# Patient Record
Sex: Male | Born: 1955 | Race: White | Hispanic: No | Smoking: Former smoker
Health system: Southern US, Community
[De-identification: ages and names within clinical notes are randomized; demographics above are authoritative.]

## PROBLEM LIST (undated history)

## (undated) DIAGNOSIS — M1990 Unspecified osteoarthritis, unspecified site: Secondary | ICD-10-CM

## (undated) DIAGNOSIS — G44009 Cluster headache syndrome, unspecified, not intractable: Secondary | ICD-10-CM

## (undated) DIAGNOSIS — Z9981 Dependence on supplemental oxygen: Secondary | ICD-10-CM

## (undated) DIAGNOSIS — J449 Chronic obstructive pulmonary disease, unspecified: Secondary | ICD-10-CM

## (undated) DIAGNOSIS — K219 Gastro-esophageal reflux disease without esophagitis: Secondary | ICD-10-CM

## (undated) DIAGNOSIS — J302 Other seasonal allergic rhinitis: Secondary | ICD-10-CM

## (undated) DIAGNOSIS — I2699 Other pulmonary embolism without acute cor pulmonale: Secondary | ICD-10-CM

## (undated) DIAGNOSIS — J189 Pneumonia, unspecified organism: Secondary | ICD-10-CM

## (undated) DIAGNOSIS — I214 Non-ST elevation (NSTEMI) myocardial infarction: Secondary | ICD-10-CM

## (undated) DIAGNOSIS — J42 Unspecified chronic bronchitis: Secondary | ICD-10-CM

## (undated) DIAGNOSIS — F419 Anxiety disorder, unspecified: Secondary | ICD-10-CM

## (undated) HISTORY — DX: Chronic obstructive pulmonary disease, unspecified: J44.9

## (undated) HISTORY — PX: TURBINATE REDUCTION: SHX6157

## (undated) HISTORY — DX: Other seasonal allergic rhinitis: J30.2

---

## 1981-11-09 HISTORY — PX: NASAL SEPTOPLASTY W/ TURBINOPLASTY: SHX2070

## 2002-03-15 ENCOUNTER — Encounter (INDEPENDENT_AMBULATORY_CARE_PROVIDER_SITE_OTHER): Payer: Self-pay | Admitting: Specialist

## 2002-03-15 ENCOUNTER — Ambulatory Visit (HOSPITAL_COMMUNITY): Admission: RE | Admit: 2002-03-15 | Discharge: 2002-03-15 | Payer: Self-pay | Admitting: Gastroenterology

## 2008-05-17 ENCOUNTER — Emergency Department (HOSPITAL_COMMUNITY): Admission: EM | Admit: 2008-05-17 | Discharge: 2008-05-18 | Payer: Self-pay | Admitting: Emergency Medicine

## 2011-03-27 NOTE — Procedures (Signed)
[place]. [facility]  Patient:    Theodore [name], Theodore [name] Visit Number: [id number] MRN: [id number]          Service Type: END Location: ENDO Attending Physician:  Charna Elizabeth Dictated by:   Anselmo Rod, M.D. Proc. Date: 03/15/02 Admit Date:  03/15/2002   CC:         Gabriel Earing, M.D.   Procedure Report  DATE OF BIRTH:  05-21-1956.  REFERRING PHYSICIAN:  Gabriel Earing, M.D.  PROCEDURE PERFORMED:  Colonoscopy with biopsies.  ENDOSCOPIST:  Anselmo Rod, M.D.  INSTRUMENT USED:  Olympus video colonoscope.  INDICATIONS FOR PROCEDURE:  Rectal bleeding in a 55 year old white male rule out colonic polyps masses, hemorrhoids, etc.  PREPROCEDURE PREPARATION:  Informed consent was procured from the patient. The patient was fasted for eight hours prior to the procedure and prepped with a bottle of magnesium citrate and a gallon of NuLytely the night prior to the procedure.  PREPROCEDURE PHYSICAL:  The patient had stable vital signs.  Neck supple. Chest clear to auscultation.  S1, S2 regular.  Abdomen soft with normal bowel sounds.  DESCRIPTION OF PROCEDURE:  The patient was placed in the left lateral decubitus position and sedated with 70 mg of Demerol and 7 mg intravenously. Once the patient was adequately sedated and maintained on low-flow oxygen and continuous cardiac monitoring, the Olympus video colonoscope was advanced from the rectum to the cecum without difficulty.  A small sessile polyp was seen at the cecal base.  This was biopsied for pathology.  Lipomatous lesion was also seen in the proximal transverse colon.  It was also biopsied for pathology. The patient had small internal hemorrhoids on retroflexion in the rectum and tolerated the procedure well without complications.  No large masses, polyps or diverticula were seen.  IMPRESSION: 1. Sessile polyp biopsied from cecal base. 2. Small lipomatous lesion biopsied from proximal  transverse colon. 3. Small internal hemorrhoid. 4. No evidence of diverticulosis.  RECOMMENDATIONS: 1. Await pathology results. 2. High fiber diet. 3. Outpatient follow-up in the next two weeks.Dictated by:   Anselmo Rod, M.D. Attending Physician:  Charna Elizabeth DD:  03/15/02 TD:  03/16/02 Job: [id number]

## 2011-08-06 LAB — COMPREHENSIVE METABOLIC PANEL
ALT: 23
AST: 23
Alkaline Phosphatase: 61
CO2: 27
Calcium: 9.6
Chloride: 105
GFR calc non Af Amer: >60
Glucose, Bld: 122 — ABNORMAL HIGH
Potassium: 4.3
Sodium: 139
Total Bilirubin: 0.7

## 2011-08-06 LAB — CBC
Hemoglobin: 15.6
MCHC: 34.2
RBC: 4.89
WBC: 15.6 — ABNORMAL HIGH

## 2011-08-06 LAB — LIPASE, BLOOD: Lipase: 20

## 2011-08-06 LAB — URINALYSIS, ROUTINE W REFLEX MICROSCOPIC
Bilirubin Urine: NEGATIVE
Ketones, ur: NEGATIVE
Leukocytes, UA: NEGATIVE
Nitrite: NEGATIVE
Protein, ur: NEGATIVE

## 2011-08-06 LAB — URINE MICROSCOPIC-ADD ON

## 2011-08-06 LAB — DIFFERENTIAL
Basophils Absolute: 0.1
Basophils Relative: 0
Eosinophils Absolute: 0
Eosinophils Relative: 0
Neutrophils Relative %: 89 — ABNORMAL HIGH

## 2011-09-28 ENCOUNTER — Ambulatory Visit (INDEPENDENT_AMBULATORY_CARE_PROVIDER_SITE_OTHER): Payer: 59 | Admitting: Internal Medicine

## 2011-09-28 ENCOUNTER — Encounter: Payer: Self-pay | Admitting: Internal Medicine

## 2011-09-28 ENCOUNTER — Ambulatory Visit (INDEPENDENT_AMBULATORY_CARE_PROVIDER_SITE_OTHER)
Admission: RE | Admit: 2011-09-28 | Discharge: 2011-09-28 | Disposition: A | Discharge status: Home / Self Care | Payer: 59 | Source: Ambulatory Visit | Attending: Internal Medicine | Admitting: Internal Medicine

## 2011-09-28 VITALS — BP 112/82 | HR 73 | Temp 98.8°F | Ht 76.0 in | Wt 189.0 lb

## 2011-09-28 DIAGNOSIS — J449 Chronic obstructive pulmonary disease, unspecified: Secondary | ICD-10-CM

## 2011-09-28 MED ORDER — BUDESONIDE-FORMOTEROL FUMARATE 160-4.5 MCG/ACT IN AERO: INHALATION_SPRAY | RESPIRATORY_TRACT | Status: DC

## 2011-09-28 MED ORDER — PREDNISONE (PAK) 10 MG PO TABS: ORAL_TABLET | ORAL | Status: AC

## 2011-09-28 NOTE — Progress Notes (Signed)
  Subjective:    Patient ID: Theodore [name], male    DOB: 1955-11-17, 55 y.o.   MRN: [id number]  HPI  47 yowm quit smoking around 2004 p onset of breathing difficulty with doe since flu in 1994 and slowly downhill since quit smoking in terms of best day function so referred to Pulmonary clinic 09/2011 by Dr Reggy Eye  09/28/2011 1st pulmonary eval cc persistent doe x 8 years slowly progressive with best days now moderate pace x mall ok and some days much worse despite spriva and foradil daily. Minimally better from albuterol.  Much better resp symptoms p prednisone for cluster ha, def improvement in head congestion, also less cough with prednsione.   Presently having more cough at hs > white mucus, freq exac in am's, better p coffeee and gets around to taking am meds only an hour or two p rising.  Sleeping ok without nocturnal  exacerbation  of respiratory  c/o's or need for noct saba.  Also denies any obvious fluctuation of symptoms with weather or environmental changes or other aggravating or alleviating factors except as outlined above   No previous h/o allergies or asthma as child or adult, no overt HB      Review of Systems  Constitutional: Negative for fever, chills, activity change, appetite change and unexpected weight change.  HENT: Positive for congestion. Negative for sore throat, rhinorrhea, sneezing, trouble swallowing, dental problem, voice change and postnasal drip.   Eyes: Negative for visual disturbance.  Respiratory: Positive for cough and shortness of breath. Negative for choking.   Cardiovascular: Negative for chest pain and leg swelling.  Gastrointestinal: Negative for nausea, vomiting and abdominal pain.  Genitourinary: Negative for difficulty urinating.  Musculoskeletal: Negative for arthralgias.  Skin: Negative for rash.  Psychiatric/Behavioral: Negative for behavioral problems and confusion.       Objective:   Physical Exam  Thin pleasant amb wm nad  Wt  189 09/28/2011   HEENT mild turbinate edema.  Oropharynx no thrush or excess pnd or cobblestoning.  No JVD or cervical adenopathy. Mild accessory muscle hypertrophy. Trachea midline, nl thryroid. Chest was hyperinflated by percussion with diminished breath sounds and moderate increased exp time without wheeze. Hoover sign positive at mid inspiration. Regular rate and rhythm without murmur gallop or rub or increase P2 or edema.  Abd: no hsm, nl excursion. Ext warm without cyanosis or clubbing.    CXR  09/28/2011 :  Changes of COPD. No acute abnormalities.      Assessment & Plan:

## 2011-09-28 NOTE — Patient Instructions (Addendum)
Please remember to go to the  x-ray department downstairs for your tests - we will call you with the results when they are available.  Stop foradil  Start symbicort 160  Take 2 puffs first thing in am and then another 2 puffs about 12 hours later.   Work on inhaler technique:  relax and gently blow all the way out then take a nice smooth deep breath back in, triggering the inhaler at same time you start breathing in.  Hold for up to 5 seconds if you can.  Rinse and gargle with water when done   If your mouth or throat starts to bother you,   I suggest you time the inhaler to your dental care and after using the inhaler(s) brush teeth and tongue with a baking soda containing toothpaste and when you rinse this out, gargle with it first to see if this helps your mouth and throat.     Prednisone 10 mg take  4 each am x 2 days,   2 each am x 2 days,  1 each am x2days and stop  Please schedule a follow up office visit in 4 weeks, sooner if needed with pft's

## 2011-09-28 NOTE — Assessment & Plan Note (Addendum)
Moderate clinically sp remote smoking cessation with asthmatic component suggested by improvement albeit temporary to prednisone rx in past.  Although he says he's tried symbicort in past, his hfa was poor but improve to 75% p coaching so worth rechallenging at this point with symbicort 160 Take 2 puffs first thing in am and then another 2 puffs about 12 hour later then regroup in 4 weeks with pft's.  Also reviewed dpi and improved to 100%

## 2011-09-29 ENCOUNTER — Telehealth: Payer: Self-pay | Admitting: Internal Medicine

## 2011-09-29 NOTE — Telephone Encounter (Signed)
Must be an error

## 2011-09-29 NOTE — Telephone Encounter (Signed)
Theodore [name] is aware

## 2011-09-29 NOTE — Telephone Encounter (Signed)
I spoke with vicky and she states she received HPI and pt's assessment plan. She was wanting to know if this was a referral to hospice. I looked in pt chart and did not see anything regarding hospice. Will forward to Dr. Sherene Sires to see if pt was suppose to be referred to hospice. Please advise Dr. Sherene Sires, thanks

## 2011-09-30 ENCOUNTER — Telehealth: Payer: Self-pay | Admitting: Internal Medicine

## 2011-09-30 NOTE — Telephone Encounter (Signed)
Pt aware of cxr results per Mw.

## 2011-11-02 ENCOUNTER — Ambulatory Visit: Payer: 59 | Admitting: Internal Medicine

## 2011-12-02 ENCOUNTER — Ambulatory Visit (INDEPENDENT_AMBULATORY_CARE_PROVIDER_SITE_OTHER): Payer: 59 | Admitting: Internal Medicine

## 2011-12-02 ENCOUNTER — Encounter: Payer: Self-pay | Admitting: Internal Medicine

## 2011-12-02 VITALS — BP 150/100 | HR 73 | Temp 97.7°F | Ht 76.0 in | Wt 188.0 lb

## 2011-12-02 DIAGNOSIS — J449 Chronic obstructive pulmonary disease, unspecified: Secondary | ICD-10-CM

## 2011-12-02 LAB — PULMONARY FUNCTION TEST

## 2011-12-02 MED ORDER — MOMETASONE FURO-FORMOTEROL FUM 200-5 MCG/ACT IN AERO: INHALATION_SPRAY | RESPIRATORY_TRACT | Status: DC

## 2011-12-02 MED ORDER — LEVALBUTEROL TARTRATE 45 MCG/ACT IN AERO: 1.0000 | INHALATION_SPRAY | RESPIRATORY_TRACT | Status: DC | PRN

## 2011-12-02 NOTE — Progress Notes (Signed)
PFT done today. 

## 2011-12-02 NOTE — Progress Notes (Signed)
  Subjective:    Patient ID: Theodore [name], male    DOB: 12-Aug-1956   MRN: [id number]  HPI  Brief patient profile:  66 yowm quit smoking around 2004 p onset of breathing difficulty with doe since flu in 1994 and slowly downhill since quit smoking in terms of best day function so referred to Pulmonary clinic 09/2011 by Dr Reggy Eye  09/28/2011 1st pulmonary eval cc persistent doe x 8 years slowly progressive with best days now moderate pace x mall ok and some days much worse despite spriva and foradil daily. Minimally better from albuterol.  Much better resp symptoms p prednisone for cluster ha, def improvement in head congestion, also less cough with prednsione.   Presently having more cough at hs > white mucus, freq exac in am's, better p coffeee and gets around to taking am meds only an hour or two p rising. rec Please remember to go to the  x-ray department downstairs for your tests - we will call you with the results when they are available. Stop foradil Start symbicort 160  Take 2 puffs first thing in am and then another 2 puffs about 12 hours later.  Work on inhaler technique:  .    Prednisone 10 mg take  4 each am x 2 days,   2 each am x 2 days,  1 each am x2days and stop Please schedule a follow up office visit in 4 weeks, sooner if needed with pft's   12/02/2011 f/u ov/Astrid Vides cc better on prednisone then some worse off it but overall  Better vs baseline doe. No cough  Sleeping ok without nocturnal  exacerbation  of respiratory  c/o's or need for noct saba.  Also denies any obvious fluctuation of symptoms with weather or environmental changes or other aggravating or alleviating factors except as outlined above   No previous h/o allergies or asthma as child or adult, no overt HB  ROS  At present neg for  any significant sore throat, dysphagia, itching, sneezing,  nasal congestion or excess/ purulent secretions,  fever, chills, sweats, unintended wt loss, pleuritic or exertional cp,  hempoptysis, orthopnea pnd or leg swelling.  Also denies presyncope, palpitations, heartburn, abdominal pain, nausea, vomiting, diarrhea  or change in bowel or urinary habits, dysuria,hematuria,  rash, arthralgias, visual complaints, headache, numbness weakness or ataxia.               Objective:   Physical Exam  Thin pleasant amb wm nad  Wt 189 09/28/2011 > 12/02/2011  188  HEENT mild turbinate edema.  Oropharynx no thrush or excess pnd or cobblestoning.  No JVD or cervical adenopathy. Mild accessory muscle hypertrophy. Trachea midline, nl thryroid. Chest was hyperinflated by percussion with diminished breath sounds and moderate increased exp time without wheeze. Hoover sign positive at mid inspiration. Regular rate and rhythm without murmur gallop or rub or increase P2 or edema.  Abd: no hsm, nl excursion. Ext warm without cyanosis or clubbing.    CXR  09/28/2011 :  Changes of COPD. No acute abnormalities.      Assessment & Plan:

## 2011-12-02 NOTE — Patient Instructions (Signed)
Dulera 200 Take 2 puffs first thing in am and then another 2 puffs about 12 hours later and if you like it fill the prescription  Continue spiriva each am only - take this  immediately after dulera   Work on [employer] perfect inhaler technique:  relax and gently blow all the way out then take a nice smooth deep breath back in, triggering the inhaler at same time you start breathing in.  Hold for up to 5 seconds if you can.  Rinse and gargle with water when done   If your mouth or throat starts to bother you,   I suggest you time the inhaler to your dental care and after using the inhaler(s) brush teeth and tongue with a baking soda containing toothpaste and when you rinse this out, gargle with it first to see if this helps your mouth and throat.     Avoid salt and monitor your blood pressure and follow up with your primary doctor   For cough mucinex dm   Only use your albuterol as a rescue medication to be used if you can't catch your breath by resting or doing a relaxed purse lip breathing pattern. The less you use it, the better it will work when you need it.   Please schedule a follow up visit in 3 months but call sooner if needed

## 2011-12-10 ENCOUNTER — Encounter: Payer: Self-pay | Admitting: Internal Medicine

## 2011-12-11 NOTE — Assessment & Plan Note (Signed)
-   HFA 75% p coaching > 90% p coaching 12/02/2011    - PFT's 12/02/2011  FEV1  0.94 (24%)  But 18% better p B 2 and DLCO 60%  GOLD IV but a significant reversal after B2 should be at least partially improved with steroids, as his hx suggests  The proper method of use, as well as anticipated side effects, of this metered-dose inhaler are discussed and demonstrated to the patient. Improved to 90% with extensive coaching, try dulera 200 bid and spiriva then return in 3 months to regroup.

## 2012-02-03 ENCOUNTER — Telehealth: Payer: Self-pay | Admitting: Internal Medicine

## 2012-02-03 ENCOUNTER — Encounter: Payer: Self-pay | Admitting: Internal Medicine

## 2012-02-03 ENCOUNTER — Ambulatory Visit (INDEPENDENT_AMBULATORY_CARE_PROVIDER_SITE_OTHER): Payer: 59 | Admitting: Internal Medicine

## 2012-02-03 ENCOUNTER — Inpatient Hospital Stay (HOSPITAL_COMMUNITY)
Admission: AD | Admit: 2012-02-03 | Discharge: 2012-02-06 | DRG: 191 | Disposition: A | Discharge status: Home / Self Care | Payer: 59 | Source: Ambulatory Visit | Attending: Internal Medicine | Admitting: Internal Medicine

## 2012-02-03 VITALS — BP 148/78 | HR 106 | Temp 98.6°F | Ht 76.0 in | Wt 189.8 lb

## 2012-02-03 DIAGNOSIS — E871 Hypo-osmolality and hyponatremia: Secondary | ICD-10-CM | POA: Diagnosis present

## 2012-02-03 DIAGNOSIS — J441 Chronic obstructive pulmonary disease with (acute) exacerbation: Secondary | ICD-10-CM

## 2012-02-03 DIAGNOSIS — Z23 Encounter for immunization: Secondary | ICD-10-CM

## 2012-02-03 DIAGNOSIS — Z825 Family history of asthma and other chronic lower respiratory diseases: Secondary | ICD-10-CM

## 2012-02-03 DIAGNOSIS — J309 Allergic rhinitis, unspecified: Secondary | ICD-10-CM | POA: Diagnosis present

## 2012-02-03 DIAGNOSIS — Z79899 Other long term (current) drug therapy: Secondary | ICD-10-CM

## 2012-02-03 DIAGNOSIS — J449 Chronic obstructive pulmonary disease, unspecified: Secondary | ICD-10-CM

## 2012-02-03 DIAGNOSIS — F17200 Nicotine dependence, unspecified, uncomplicated: Secondary | ICD-10-CM | POA: Diagnosis present

## 2012-02-03 LAB — CBC
MCH: 31.8 pg (ref 26.0–34.0)
MCV: 94.2 fL (ref 78.0–100.0)
Platelets: 266 10*3/uL (ref 150–400)
RDW: 13.2 % (ref 11.5–15.5)

## 2012-02-03 LAB — BLOOD GAS, ARTERIAL
Bicarbonate: 22 mEq/L (ref 20.0–24.0)
Drawn by: 317871
FIO2: 0.21 %
O2 Saturation: 97.9 %
Patient temperature: 37

## 2012-02-03 LAB — DIFFERENTIAL
Basophils Absolute: 0 10*3/uL (ref 0.0–0.1)
Eosinophils Absolute: 0 10*3/uL (ref 0.0–0.7)
Eosinophils Relative: 0 % (ref 0–5)

## 2012-02-03 LAB — COMPREHENSIVE METABOLIC PANEL
Alkaline Phosphatase: 67 U/L (ref 39–117)
BUN: 14 mg/dL (ref 6–23)
Chloride: 95 mEq/L — ABNORMAL LOW (ref 96–112)
GFR calc Af Amer: >90 mL/min (ref >90)
Glucose, Bld: 93 mg/dL (ref 70–99)
Potassium: 4.1 mEq/L (ref 3.5–5.1)
Total Bilirubin: 0.8 mg/dL (ref 0.3–1.2)
Total Protein: 7.7 g/dL (ref 6.0–8.3)

## 2012-02-03 LAB — MAGNESIUM: Magnesium: 1.8 mg/dL (ref 1.5–2.5)

## 2012-02-03 LAB — CARDIAC PANEL(CRET KIN+CKTOT+MB+TROPI): Relative Index: 1.5 (ref 0.0–2.5)

## 2012-02-03 MED ORDER — METHYLPREDNISOLONE SODIUM SUCC 125 MG IJ SOLR
60.0000 mg | Freq: Four times a day (QID) | INTRAMUSCULAR | Status: DC
  Administered 2012-02-03 – 2012-02-04 (×3): 60 mg via INTRAVENOUS
  Filled 2012-02-03 (×6): qty 0.96

## 2012-02-03 MED ORDER — DEXTROSE 5 % IV SOLN
1.0000 g | INTRAVENOUS | Status: DC
  Administered 2012-02-03: 1 g via INTRAVENOUS
  Filled 2012-02-03 (×2): qty 10

## 2012-02-03 MED ORDER — LEVALBUTEROL HCL 0.63 MG/3ML IN NEBU
0.6300 mg | INHALATION_SOLUTION | Freq: Once | RESPIRATORY_TRACT | Status: AC
  Administered 2012-02-03: 0.63 mg via RESPIRATORY_TRACT

## 2012-02-03 MED ORDER — HEPARIN SODIUM (PORCINE) 5000 UNIT/ML IJ SOLN
5000.0000 [IU] | Freq: Three times a day (TID) | INTRAMUSCULAR | Status: DC
  Administered 2012-02-03 – 2012-02-06 (×8): 5000 [IU] via SUBCUTANEOUS
  Filled 2012-02-03 (×11): qty 1

## 2012-02-03 MED ORDER — DEXTROSE 5 % IV SOLN
500.0000 mg | INTRAVENOUS | Status: DC
  Administered 2012-02-03: 500 mg via INTRAVENOUS
  Filled 2012-02-03 (×2): qty 500

## 2012-02-03 MED ORDER — ALBUTEROL SULFATE (5 MG/ML) 0.5% IN NEBU
2.5000 mg | INHALATION_SOLUTION | RESPIRATORY_TRACT | Status: DC | PRN
  Filled 2012-02-03: qty 0.5

## 2012-02-03 MED ORDER — SODIUM CHLORIDE 0.9 % IV SOLN: 250.0000 mL | INTRAVENOUS | Status: DC | PRN

## 2012-02-03 MED ORDER — ASPIRIN 81 MG PO CHEW
324.0000 mg | CHEWABLE_TABLET | ORAL | Status: AC
  Administered 2012-02-03: 324 mg via ORAL
  Filled 2012-02-03: qty 4

## 2012-02-03 MED ORDER — ALBUTEROL SULFATE (5 MG/ML) 0.5% IN NEBU
2.5000 mg | INHALATION_SOLUTION | RESPIRATORY_TRACT | Status: DC
  Administered 2012-02-03 – 2012-02-04 (×5): 2.5 mg via RESPIRATORY_TRACT
  Filled 2012-02-03 (×5): qty 0.5

## 2012-02-03 MED ORDER — IPRATROPIUM BROMIDE 0.02 % IN SOLN
0.5000 mg | RESPIRATORY_TRACT | Status: DC
  Administered 2012-02-03 – 2012-02-04 (×5): 0.5 mg via RESPIRATORY_TRACT
  Filled 2012-02-03 (×5): qty 2.5

## 2012-02-03 MED ORDER — ASPIRIN 300 MG RE SUPP
300.0000 mg | RECTAL | Status: AC
  Filled 2012-02-03: qty 1

## 2012-02-03 MED ORDER — METHYLPREDNISOLONE ACETATE 80 MG/ML IJ SUSP
80.0000 mg | Freq: Once | INTRAMUSCULAR | Status: AC
Start: 2012-02-03 — End: 2012-02-03
  Administered 2012-02-03: 80 mg via INTRAMUSCULAR

## 2012-02-03 NOTE — H&P (Addendum)
Patient name: Theodore [name] Medical record number: [id number] Date of birth: May 30, 1956 Age: 56 y.o. Gender: male PCP: [facility].  Date: 02/03/2012  Brief history  AECOPD. ADmitted from office 02/03/2012    Lines/tubes  Culture data/sepsis markers  Antibiotics Anti-infectives    None        Best practice Heparin Protonix  Protocols/consults  Events/studies  HPI:    55 yowm quit smoking around 2004 p onset of breathing difficulty with doe since flu in 1994 and slowly downhill since quit smoking in terms of best day function so referred to Pulmonary clinic 09/2011 by Dr Reggy Eye   To Dr Jerilee Hoh  ACUTE OV 02/03/2012   [facility]  - PFTs 12/02/11: Fev1 0.94/24% with 18% BD response. TLC 128% and DLCO 60% . Maintainted on dulera and spiriva. AT baseline says last prednsione burst was nov 2012 and only one pred burst past year. Last hospitalizaiton was when he was a child. 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   However, currently  - 4 days ago worked hard at [employer] in [facility]. Then 3 days ago had suddent onset dyspnea, cough, wheeze. Rates it as severe. Progressive. Very dyspneic. Assocuated white sputum with yellow hue present. Orthopneic +. Similar to episode in nov 2012 when prednisone helped but this is worst and says last night was frighteningin. Denies fever, chest pain, nause, vomit, diarrhea, edema .    - note he is listed as ex-smoker but he admitted to me that he smokes a few cigs occasionally   - Current outpatient prescriptions:FORADIL AEROLIZER 12 MCG capsule for inhaler, Place 1 capsule into inhaler and inhale Twice daily., Disp: , Rfl: ; levalbuterol (XOPENEX HFA) 45 MCG/ACT inhaler, Inhale 1 puff into the lungs every 4 (four) hours as needed for wheezing., Disp: 1 Inhaler, Rfl: 2; SPIRIVA HANDIHALER 18 MCG inhalation capsule, Inhale contents of 1 cap daily, Disp: , Rfl:      Past Medical History    Diagnosis Date  . COPD (chronic obstructive pulmonary disease)   . Chronic headache   . Seasonal allergies     Past Surgical History  Procedure Date  . Nasal septoplasty w/ turbinoplasty 1983    Family History  Problem Relation Age of Onset  . Leukemia Father   . Heart disease Father   . Asthma Sister   . Asthma Father     has asthma as a child  . Emphysema Sister     smoker    Social History:  reports that he has been smoking Cigarettes.  He has smoked for the past 35 years. He has never used smokeless tobacco. He reports that he drinks alcohol. He reports that he does not use illicit drugs.  Allergies:  Allergies  Allergen Reactions  . Aleve Itching    Medications:  Prior to Admission medications   Medication Sig Start Date End Date Taking? Authorizing Provider  FORADIL AEROLIZER 12 MCG capsule for inhaler Place 1 capsule into inhaler and inhale Twice daily. 11/06/11   Historical Provider, MD  levalbuterol Pauline Aus HFA) 45 MCG/ACT inhaler Inhale 1 puff into the lungs every 4 (four) hours as needed for wheezing. 12/02/11 12/01/12  Nyoka Cowden, MD  SPIRIVA HANDIHALER 18 MCG inhalation capsule Inhale contents of 1 cap daily 09/27/11   Historical Provider, MD    Pertinent items are noted in HPI. Otherwise 11 point ROS negative  Review of Systems  Constitutional: Negative for  fever and unexpected weight change.  HENT: Negative for ear pain, nosebleeds, congestion, sore throat, rhinorrhea, sneezing, trouble swallowing, dental problem, postnasal drip and sinus pressure.  Eyes: Negative for redness and itching.  Respiratory: Positive for cough, chest tightness and shortness of breath. Negative for wheezing.  Cardiovascular: Negative for palpitations and leg swelling.  Gastrointestinal: Negative for nausea and vomiting.  Genitourinary: Negative for dysuria.  Musculoskeletal: Negative for joint swelling.  Skin: Negative for rash.  Neurological: Negative for headaches.   Hematological: Does not bruise/bleed easily.  Psychiatric/Behavioral: Negative for dysphoric mood. The patient is not nervous/anxious.    Temp:  [98.6 F (37 C)] 98.6 F (37 C) (03/27 1617) Pulse Rate:  [106] 106  (03/27 1617) BP: (148)/(78) 148/78 mmHg (03/27 1617) SpO2:  [93 %] 93 % (03/27 1617) Weight:  [86.093 kg (189 lb 12.8 oz)] 86.093 kg (189 lb 12.8 oz) (03/27 1617)   No intake or output data in the 24 hours ending 02/03/12 1729 Physical exam  Physical Exam  Nursing note and vitals reviewed.  Constitutional: He is oriented to person, place, and time. He appears well-developed and well-nourished. No distress.  Body mass index is 23.10 kg/(m^2). HENT:  Head: Normocephalic and atraumatic.  Right Ear: External ear normal.  Left Ear: External ear normal.  Mouth/Throat: Oropharynx is clear and moist. No oropharyngeal exudate.  Eyes: Conjunctivae and EOM are normal. Pupils are equal, round, and reactive to light. Right eye exhibits no discharge. Left eye exhibits no discharge. No scleral icterus.  Neck: Normal range of motion. Neck supple. No JVD present. No tracheal deviation present. No thyromegaly present.  Cardiovascular: Normal rate, regular rhythm and intact distal pulses. Exam reveals no gallop and no friction rub.  No murmur heard.  Pulmonary/Chest: He is in respiratory distress. He has no wheezes. He has no rales. He exhibits no tenderness.  Uses accessory muscle Unable to complete sentences Coughs periodically No wheeze  Mildly tachypneic No cyanosis   Abdominal: Soft. Bowel sounds are normal. He exhibits no distension and no mass. There is no tenderness. There is no rebound and no guarding.  Musculoskeletal: Normal range of motion. He exhibits no edema and no tenderness.  Lymphadenopathy:  He has no cervical adenopathy.  Neurological: He is alert and oriented to person, place, and time. He has normal reflexes. No cranial nerve deficit. Coordination normal.   Skin: Skin is warm and dry. No rash noted. He is not diaphoretic. No erythema. No pallor.  Psychiatric: He has a normal mood and affect. His behavior is normal. Judgment and thought content normal.      radiology    LAB RESULT Lab Results  Component Value Date   CREATININE 1.02 05/17/2008   BUN 12 05/17/2008   NA 139 05/17/2008   K 4.3 05/17/2008   CL 105 05/17/2008   CO2 27 05/17/2008   Lab Results  Component Value Date   WBC 15.6* 05/17/2008   HGB 15.6 05/17/2008   HCT 45.5 05/17/2008   MCV 93.0 05/17/2008   PLT 341 05/17/2008   Lab Results  Component Value Date   ALT 23 05/17/2008   AST 23 05/17/2008   ALKPHOS 61 05/17/2008   BILITOT 0.7 05/17/2008   No results found for this basename: INR, PROTIME     Assessment and Plan    Acute Exacerbation COPD   PLAN   Admit [place] [address]  Rx AECOPD with nebs and steroids and antibiotics  Check cxr, abg, labs   Tobacco Use  PLAN   Inpatient counseling to stop    Dr. Kalman Shan, M.D., [facility].C.P Pulmonary and Critical Care Medicine Staff Physician [facility] Pager: [phone], If no answer or between  15:00h - 7:00h: call [phone]  02/03/2012 5:46 PM

## 2012-02-03 NOTE — Telephone Encounter (Signed)
Called, spoke with pt.  He c/o increased SOB, prod cough with dark white mucus, wheezing, and chest tightness.  Started on Sunday night.  Denies f/c/s.  OV scheduled with MR for today at 4:15 pm -- pt aware.

## 2012-02-03 NOTE — Progress Notes (Signed)
Subjective:    Patient ID: Theodore [name], male    DOB: [birth year], 56 y.o.   MRN: [id number]  HPI 10 yowm quit smoking around 2004 p onset of breathing difficulty with doe since flu in 1994 and slowly downhill since quit smoking in terms of best day function so referred to Pulmonary clinic 09/2011 by Dr Reggy Eye  09/28/2011 1st pulmonary eval cc persistent doe x 8 years slowly progressive with best days now moderate pace x mall ok and some days much worse despite spriva and foradil daily. Minimally better from albuterol.  Much better resp symptoms p prednisone for cluster ha, def improvement in head congestion, also less cough with prednsione.   Presently having more cough at hs > white mucus, freq exac in am's, better p coffeee and gets around to taking am meds only an hour or two p rising. rec Please remember to go to the  x-ray department downstairs for your tests - we will call you with the results when they are available. Stop foradil Start symbicort 160  Take 2 puffs first thing in am and then another 2 puffs about 12 hours later.  Work on inhaler technique:  .    Prednisone 10 mg take  4 each am x 2 days,   2 each am x 2 days,  1 each am x2days and stop Please schedule a follow up office visit in 4 weeks, sooner if needed with pft's   12/02/2011 f/u ov/Wert cc better on prednisone then some worse off it but overall  Better vs baseline doe. No cough  Sleeping ok without nocturnal  exacerbation  of respiratory  c/o's or need for noct saba.  Also denies any obvious fluctuation of symptoms with weather or environmental changes or other aggravating or alleviating factors except as outlined above   No previous h/o allergies or asthma as child or adult, no overt HB  Dulera 200 Take 2 puffs first thing in am and then another 2 puffs about 12 hours later and if you like it fill the prescription  Continue spiriva each am only - take this immediately after dulera  Work on [employer]  perfect inhaler technique: relax and gently blow all the way out then take a nice smooth deep breath back in, triggering the inhaler at same time you start breathing in. Hold for up to 5 seconds if you can. Rinse and gargle with water when done  If your mouth or throat starts to bother you, I suggest you time the inhaler to your dental care and after using the inhaler(s) brush teeth and tongue with a baking soda containing toothpaste and when you rinse this out, gargle with it first to see if this helps your mouth and throat.  Avoid salt and monitor your blood pressure and follow up with your primary doctor  For cough mucinex dm  Only use your albuterol as a rescue medication to be used if you can't catch your breath by resting or doing a relaxed purse lip breathing pattern. The less you use it, the better it will work when you need it.  Please schedule a follow up visit in 3 months but call sooner if needed   OV 02/03/2012  COPD  Baseline   - PFTs 12/02/11: Fev1 0.94/24% with 18% BD response. TLC 128% and DLCO 60% . Maintainted on dulera and spiriva. AT baseline says last prednsione burst was nov 2012 and only one pred burst past year. Last hospitalizaiton was when he was  a child. 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   Currently  - 4 days ago worked hard at [employer] in [facility]. Then 3 days ago had suddent onset dyspnea, cough, wheeze. Rates it as severe. Progressive. Very dyspneic. Assocuated white sputum with yellow hue present. Orthopneic +. Similar to episode in nov 2012 when prednisone helped.  Denies fever, chest pain, nause, vomit, diarrhea, edema    Current outpatient prescriptions:FORADIL AEROLIZER 12 MCG capsule for inhaler, Place 1 capsule into inhaler and inhale Twice daily., Disp: , Rfl: ;  levalbuterol (XOPENEX HFA) 45 MCG/ACT inhaler, Inhale 1 puff into the lungs every 4 (four) hours as needed for wheezing., Disp: 1 Inhaler, Rfl: 2;  SPIRIVA  HANDIHALER 18 MCG inhalation capsule, Inhale contents of 1 cap daily, Disp: , Rfl:   Past, Family, Social reviewed: no change since last visit     Review of Systems  Constitutional: Negative for fever and unexpected weight change.  HENT: Negative for ear pain, nosebleeds, congestion, sore throat, rhinorrhea, sneezing, trouble swallowing, dental problem, postnasal drip and sinus pressure.   Eyes: Negative for redness and itching.  Respiratory: Positive for cough, chest tightness and shortness of breath. Negative for wheezing.   Cardiovascular: Negative for palpitations and leg swelling.  Gastrointestinal: Negative for nausea and vomiting.  Genitourinary: Negative for dysuria.  Musculoskeletal: Negative for joint swelling.  Skin: Negative for rash.  Neurological: Negative for headaches.  Hematological: Does not bruise/bleed easily.  Psychiatric/Behavioral: Negative for dysphoric mood. The patient is not nervous/anxious.        Objective:   Physical Exam  Nursing note and vitals reviewed. Constitutional: He is oriented to person, place, and time. He appears well-developed and well-nourished. No distress.       Body mass index is 23.10 kg/(m^2).   HENT:  Head: Normocephalic and atraumatic.  Right Ear: External ear normal.  Left Ear: External ear normal.  Mouth/Throat: Oropharynx is clear and moist. No oropharyngeal exudate.  Eyes: Conjunctivae and EOM are normal. Pupils are equal, round, and reactive to light. Right eye exhibits no discharge. Left eye exhibits no discharge. No scleral icterus.  Neck: Normal range of motion. Neck supple. No JVD present. No tracheal deviation present. No thyromegaly present.  Cardiovascular: Normal rate, regular rhythm and intact distal pulses.  Exam reveals no gallop and no friction rub.   No murmur heard. Pulmonary/Chest: He is in respiratory distress. He has no wheezes. He has no rales. He exhibits no tenderness.       Uses accessory  muscle Unable to complete sentences Coughs periodically No wheeze  Mildly tachypneic No cyanosis  Does not think he needs admission  Abdominal: Soft. Bowel sounds are normal. He exhibits no distension and no mass. There is no tenderness. There is no rebound and no guarding.  Musculoskeletal: Normal range of motion. He exhibits no edema and no tenderness.  Lymphadenopathy:    He has no cervical adenopathy.  Neurological: He is alert and oriented to person, place, and time. He has normal reflexes. No cranial nerve deficit. Coordination normal.  Skin: Skin is warm and dry. No rash noted. He is not diaphoretic. No erythema. No pallor.  Psychiatric: He has a normal mood and affect. His behavior is normal. Judgment and thought content normal.         Assessment & Plan:

## 2012-02-03 NOTE — Telephone Encounter (Signed)
Pt called back. Has to go to work now but asks nurse to call [phone]. Marland Kitchen Theodore [name]

## 2012-02-04 ENCOUNTER — Inpatient Hospital Stay (HOSPITAL_COMMUNITY): Payer: 59

## 2012-02-04 ENCOUNTER — Encounter (HOSPITAL_COMMUNITY): Payer: Self-pay | Admitting: *Deleted

## 2012-02-04 DIAGNOSIS — J441 Chronic obstructive pulmonary disease with (acute) exacerbation: Secondary | ICD-10-CM

## 2012-02-04 DIAGNOSIS — F17200 Nicotine dependence, unspecified, uncomplicated: Secondary | ICD-10-CM

## 2012-02-04 DIAGNOSIS — J310 Chronic rhinitis: Secondary | ICD-10-CM

## 2012-02-04 LAB — CARDIAC PANEL(CRET KIN+CKTOT+MB+TROPI)
Relative Index: 1.7 (ref 0.0–2.5)
Total CK: 170 U/L (ref 7–232)
Troponin I: <0.3 ng/mL (ref <0.30)
Troponin I: <0.3 ng/mL (ref <0.30)

## 2012-02-04 MED ORDER — DOXYCYCLINE HYCLATE 100 MG PO TABS
100.0000 mg | ORAL_TABLET | Freq: Two times a day (BID) | ORAL | Status: DC
  Administered 2012-02-04 – 2012-02-06 (×5): 100 mg via ORAL
  Filled 2012-02-04 (×7): qty 1

## 2012-02-04 MED ORDER — SALINE SPRAY 0.65 % NA SOLN
1.0000 | NASAL | Status: DC | PRN
  Filled 2012-02-04: qty 44

## 2012-02-04 MED ORDER — PNEUMOCOCCAL VAC POLYVALENT 25 MCG/0.5ML IJ INJ
0.5000 mL | INJECTION | INTRAMUSCULAR | Status: AC
  Administered 2012-02-05: 0.5 mL via INTRAMUSCULAR
  Filled 2012-02-04 (×2): qty 0.5

## 2012-02-04 MED ORDER — FLUTICASONE PROPIONATE 50 MCG/ACT NA SUSP
2.0000 | Freq: Every day | NASAL | Status: DC
  Administered 2012-02-05 – 2012-02-06 (×2): 2 via NASAL
  Filled 2012-02-04: qty 16

## 2012-02-04 MED ORDER — ALBUTEROL SULFATE (5 MG/ML) 0.5% IN NEBU
2.5000 mg | INHALATION_SOLUTION | Freq: Four times a day (QID) | RESPIRATORY_TRACT | Status: DC
  Administered 2012-02-04 – 2012-02-06 (×8): 2.5 mg via RESPIRATORY_TRACT
  Filled 2012-02-04 (×8): qty 0.5

## 2012-02-04 MED ORDER — SALINE SPRAY 0.65 % NA SOLN
1.0000 | Freq: Two times a day (BID) | NASAL | Status: DC
  Administered 2012-02-05 – 2012-02-06 (×2): 1 via NASAL
  Filled 2012-02-04: qty 44

## 2012-02-04 MED ORDER — METHYLPREDNISOLONE SODIUM SUCC 40 MG IJ SOLR
40.0000 mg | Freq: Two times a day (BID) | INTRAMUSCULAR | Status: DC
  Administered 2012-02-04 – 2012-02-05 (×2): 40 mg via INTRAVENOUS
  Filled 2012-02-04 (×3): qty 1

## 2012-02-04 MED ORDER — IPRATROPIUM BROMIDE 0.02 % IN SOLN
0.5000 mg | Freq: Four times a day (QID) | RESPIRATORY_TRACT | Status: DC
  Administered 2012-02-04 – 2012-02-06 (×8): 0.5 mg via RESPIRATORY_TRACT
  Filled 2012-02-04 (×8): qty 2.5

## 2012-02-04 NOTE — H&P (Deleted)
Patient name: Theodore [name] Medical record number: [id number] Date of birth: Oct 05, 1956 Age: 56 y.o. Gender: male PCP: [facility].  Date: 02/04/2012  Brief history  AECOPD. ADmitted from office 02/03/2012    Lines/tubes  Culture data/sepsis markers  Antibiotics Anti-infectives     Start     Dose/Rate Route Frequency Ordered Stop   02/03/12 2000   cefTRIAXone (ROCEPHIN) 1 g in dextrose 5 % 50 mL IVPB        1 g 100 mL/hr over 30 Minutes Intravenous Every 24 hours 02/03/12 1746     02/03/12 2000   azithromycin (ZITHROMAX) 500 mg in dextrose 5 % 250 mL IVPB        500 mg 250 mL/hr over 60 Minutes Intravenous Every 24 hours 02/03/12 1746              Best practice Heparin Protonix  Protocols/consults  Events/studies  HPI:    55 yowm quit smoking around 2004 p onset of breathing difficulty with doe since flu in 1994 and slowly downhill since quit smoking in terms of best day function so referred to Pulmonary clinic 09/2011 by Dr Reggy Eye   To Dr Jerilee Hoh  ACUTE OV 02/03/2012   [facility]  - PFTs 12/02/11: Fev1 0.94/24% with 18% BD response. TLC 128% and DLCO 60% . Maintainted on dulera and spiriva. AT baseline says last prednsione burst was nov 2012 and only one pred burst past year. Last hospitalizaiton was when he was a child. 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   However, currently  - 4 days ago worked hard at [employer] in [facility]. Then 3 days ago had suddent onset dyspnea, cough, wheeze. Rates it as severe. Progressive. Very dyspneic. Assocuated white sputum with yellow hue present. Orthopneic +. Similar to episode in nov 2012 when prednisone helped but this is worst and says last night was frighteningin. Denies fever, chest pain, nause, vomit, diarrhea, edema .    - note he is listed as ex-smoker but he admitted to me that he smokes a few cigs occasionally      Temp:  [94.5 F (34.7 C)-98.9 F (37.2 C)]  94.5 F (34.7 C) (03/28 0610) Pulse Rate:  [85-106] 85  (03/28 0610) Resp:  [19-20] 20  (03/28 0610) BP: (120-148)/(69-82) 120/76 mmHg (03/28 0610) SpO2:  [93 %-97 %] 94 % (03/28 0755) Weight:  [188 lb 11.4 oz (85.6 kg)-189 lb 12.8 oz (86.093 kg)] 188 lb 11.4 oz (85.6 kg) (03/27 1910)    Intake/Output Summary (Last 24 hours) at 02/04/12 1143 Last data filed at 02/04/12 0445  Gross per 24 hour  Intake    300 ml  Output   1150 ml  Net   -850 ml   Physical exam  Physical Exam   Constitutional: He is oriented to person, place, and time. He appears well-developed and well-nourished. No distress.  Body mass index is 23.10 kg/(m^2). HENT:  Head: Normocephalic and atraumatic.  Eyes: Conjunctivae and EOM are normal. Pupils are equal, round, and reactive to light.   Neck: Normal range of motion. Neck supple. No JVD present. No tracheal deviation present. No thyromegaly present.  Cardiovascular: Normal rate, regular rhythm and intact distal pulses. Exam reveals no gallop and no friction rub.  No murmur heard.  Pulmonary/Chest: . He has no wheezes. He has no rales. He exhibits no tenderness. SOB with any activity  Abdominal: Soft. Bowel sounds are normal. He exhibits  no distension and no mass. There is no tenderness. There is no rebound and no guarding.  Musculoskeletal: Normal range of motion. He exhibits no edema and no tenderness.  Lymphadenopathy:  He has no cervical adenopathy.  Neurological: He is alert and oriented to person, place, and time. He has normal reflexes. No cranial nerve deficit. Coordination normal.  Skin: Skin is warm and dry. No rash noted. He is not diaphoretic. No erythema. No pallor.  Psychiatric: He has a normal mood and affect. His behavior is normal. Judgment and thought content normal.      radiology  No results found.   LAB RESULT  Lab 02/03/12 2022  NA 132*  K 4.1  CL 95*  CO2 25  BUN 14  CREATININE 0.77  GLUCOSE 93    Lab 02/03/12 2022    HGB 13.8  HCT 40.9  WBC 10.7*  PLT 266      Assessment and Plan    Acute Exacerbation COPD   PLAN   [address]  Rx AECOPD with nebs and steroids and antibiotics    Tobacco Use   PLAN   Inpatient counseling to stop    Brett Canales Quinnie Barcelo ACNP [facility] Pager [phone] till 3 pm If no answer page [phone] 02/04/2012, 11:43 AM

## 2012-02-04 NOTE — Progress Notes (Signed)
Theodore [name] is a 56 y.o. male smoker admitted on 02/03/2012 with GOLD 4 COPD with acute exacerbation. PMHx Headaches, Seasonal allergies, Asthma  Antibiotics: Rocephin 3/27>>3/28 Zithromax 3/27>>3/28 Doxycycline 3/28>>  Tests/events: 12/02/11 PFT>>FEV1 1.10 (28%), FEV1% 37, TLC 10.44 (128%), DLCO 60%, + BD response  SUBJECTIVE: Still has wheeze and sinus congestion.  Denies fever.  Not as much cough or sputum.  Denies chest/abd pain.  OBJECTIVE:  Blood pressure 120/76, pulse 85, temperature 94.5 F (34.7 C), temperature source Oral, resp. rate 20, height 6\' 4"  (1.93 m), weight 188 lb 11.4 oz (85.6 kg), SpO2 96.00%. Wt Readings from Last 3 Encounters:  02/03/12 188 lb 11.4 oz (85.6 kg)  02/03/12 189 lb 12.8 oz (86.093 kg)  12/02/11 188 lb (85.276 kg)   Body mass index is 22.97 kg/(m^2).  I/O last 3 completed shifts: In: 300 [IV Piggyback:300] Out: 1150 [Urine:1150]  Physical Exam: General - no distress HEENT - clear nasal discharge, no sinus tenderness, no LAN Cardiac - s1s2 regular, no murmur Chest - prolonged exhalation, b/l expiratory wheeze, no rales/dullness Abd - soft, nontender Ext - no edema Neuro - normal strength Psych - normal mood, behavior  CBC    Component Value Date/Time   WBC 10.7* 02/03/2012 2022   RBC 4.34 02/03/2012 2022   HGB 13.8 02/03/2012 2022   HCT 40.9 02/03/2012 2022   PLT 266 02/03/2012 2022   MCV 94.2 02/03/2012 2022   MCH 31.8 02/03/2012 2022   MCHC 33.7 02/03/2012 2022   RDW 13.2 02/03/2012 2022   LYMPHSABS 1.4 02/03/2012 2022   MONOABS 1.2* 02/03/2012 2022   EOSABS 0.0 02/03/2012 2022   BASOSABS 0.0 02/03/2012 2022    BMET    Component Value Date/Time   NA 132* 02/03/2012 2022   K 4.1 02/03/2012 2022   CL 95* 02/03/2012 2022   CO2 25 02/03/2012 2022   GLUCOSE 93 02/03/2012 2022   BUN 14 02/03/2012 2022   CREATININE 0.77 02/03/2012 2022   CALCIUM 9.5 02/03/2012 2022   GFRNONAA >90 02/03/2012 2022   GFRAA >90 02/03/2012 2022    Lab  Results  Component Value Date   ALT 15 02/03/2012   AST 18 02/03/2012   ALKPHOS 67 02/03/2012   BILITOT 0.8 02/03/2012   ABG    Component Value Date/Time   PHART 7.462* 02/03/2012 2026   PCO2ART 31.2* 02/03/2012 2026   PO2ART 79.1* 02/03/2012 2026   HCO3 22.0 02/03/2012 2026   TCO2 19.2 02/03/2012 2026   ACIDBASEDEF 0.5 02/03/2012 2026   O2SAT 97.9 02/03/2012 2026    No results found.  ASSESSMENT/PLAN:  AECOPD with asthmatic bronchitis and rhinitis; no evidence for pneumonia clinically or on CXR -narrow abx to doxcycline -continue solumedrol IV, but decrease dose -continue scheduled nebulizer therapy -increase activity as tolerated -will need to assess for home oxygen prior to d/c -add sinus regimen with nasal irrigation and flonase -will need to determine if he has prior evaluation for Alpha 1 Anti-trypsin deficiency>>defer testing now with acute process  Tobacco abuse -he reports intermittently smoking few cigarettes -explained that he needs to avoid all tobacco products completely  Hyponatremia -f/u BMET  Updated family at bedside  Luetta Piazza Pager:  [phone] 02/04/2012, 12:47 PM

## 2012-02-05 LAB — BASIC METABOLIC PANEL
BUN: 20 mg/dL (ref 6–23)
CO2: 26 mEq/L (ref 19–32)
Chloride: 101 mEq/L (ref 96–112)
Creatinine, Ser: 0.77 mg/dL (ref 0.50–1.35)
Potassium: 4.4 mEq/L (ref 3.5–5.1)

## 2012-02-05 MED ORDER — PREDNISONE 20 MG PO TABS
40.0000 mg | ORAL_TABLET | Freq: Every day | ORAL | Status: DC
  Administered 2012-02-05 – 2012-02-06 (×2): 40 mg via ORAL
  Filled 2012-02-05 (×2): qty 2

## 2012-02-05 NOTE — Progress Notes (Signed)
Theodore [name] is a 56 y.o. male smoker admitted on 02/03/2012 with GOLD 4 COPD with acute exacerbation. PMHx Headaches, Seasonal allergies, Asthma  Antibiotics: Rocephin 3/27>>3/28 Zithromax 3/27>>3/28 Doxycycline 3/28>>  Tests/events: 12/02/11 PFT>>FEV1 1.10 (28%), FEV1% 37, TLC 10.44 (128%), DLCO 60%, + BD response  SUBJECTIVE: Still has intermittent cough, but decrease.  Expectoration is easier.  Not as much wheeze.  Sinus congestion improving.  Walked in hall with PT w/o difficulty.  OBJECTIVE:  Blood pressure 116/68, pulse 74, temperature 97.7 F (36.5 C), temperature source Oral, resp. rate 18, height 6\' 4"  (1.93 m), weight 188 lb 11.4 oz (85.6 kg), SpO2 94.00%. Wt Readings from Last 3 Encounters:  02/03/12 188 lb 11.4 oz (85.6 kg)  02/03/12 189 lb 12.8 oz (86.093 kg)  12/02/11 188 lb (85.276 kg)   Body mass index is 22.97 kg/(m^2).  I/O last 3 completed shifts: In: 1260 [P.O.:960; IV Piggyback:300] Out: 2975 [Urine:2975]  Physical Exam: General - no distress HEENT - clear nasal discharge, no sinus tenderness, no LAN Cardiac - s1s2 regular, no murmur Chest - prolonged exhalation, faint expiratory wheeze, no rales/dullness Abd - soft, nontender Ext - no edema Neuro - normal strength Psych - normal mood, behavior  CBC    Component Value Date/Time   WBC 10.7* 02/03/2012 2022   RBC 4.34 02/03/2012 2022   HGB 13.8 02/03/2012 2022   HCT 40.9 02/03/2012 2022   PLT 266 02/03/2012 2022   MCV 94.2 02/03/2012 2022   MCH 31.8 02/03/2012 2022   MCHC 33.7 02/03/2012 2022   RDW 13.2 02/03/2012 2022   LYMPHSABS 1.4 02/03/2012 2022   MONOABS 1.2* 02/03/2012 2022   EOSABS 0.0 02/03/2012 2022   BASOSABS 0.0 02/03/2012 2022    BMET    Component Value Date/Time   NA 135 02/05/2012 0525   K 4.4 02/05/2012 0525   CL 101 02/05/2012 0525   CO2 26 02/05/2012 0525   GLUCOSE 136* 02/05/2012 0525   BUN 20 02/05/2012 0525   CREATININE 0.77 02/05/2012 0525   CALCIUM 9.6 02/05/2012 0525   GFRNONAA >90 02/05/2012 0525   GFRAA >90 02/05/2012 0525    Lab Results  Component Value Date   ALT 15 02/03/2012   AST 18 02/03/2012   ALKPHOS 67 02/03/2012   BILITOT 0.8 02/03/2012   ABG    Component Value Date/Time   PHART 7.462* 02/03/2012 2026   PCO2ART 31.2* 02/03/2012 2026   PO2ART 79.1* 02/03/2012 2026   HCO3 22.0 02/03/2012 2026   TCO2 19.2 02/03/2012 2026   ACIDBASEDEF 0.5 02/03/2012 2026   O2SAT 97.9 02/03/2012 2026    Dg Chest Port 1 View  02/04/2012  *RADIOLOGY REPORT*  Clinical Data: COPD  PORTABLE CHEST - 1 VIEW  Comparison: September 28, 2011  Findings: There is hyperexpansion, consistent with an element of COPD.  The cardiac silhouette, mediastinum, pulmonary vasculature are within normal limits.  There are no focal infiltrates or effusions.  No pneumothorax.  IMPRESSION: COPD again noted.  No evidence of acute abnormality.  Original Report Authenticated By: Brandon Melnick, M.D.    ASSESSMENT/PLAN:  AECOPD with asthmatic bronchitis and rhinitis; no evidence for pneumonia clinically or on CXR -D3/10 Abx, currently on doxcycline -change solumedrol to prednisone -continue scheduled nebulizer therapy -increase activity as tolerated -will need to assess for home oxygen prior to d/c -continue sinus regimen with nasal irrigation and flonase -will need to determine if he has prior evaluation for Alpha 1 Anti-trypsin deficiency>>defer testing now with acute process  Tobacco abuse -he reports intermittently smoking few cigarettes -explained that he needs to avoid all tobacco products completely  Hyponatremia resolved 3/29  Disposition -possible d/c home in next 1 to 2 days  Temprence Rhines Pager:  [phone] 02/05/2012, 11:58 AM

## 2012-02-05 NOTE — Evaluation (Signed)
Physical Therapy Evaluation Patient Details Name: Theodore [name] MRN: [id number] DOB: 1956/06/03 Today's Date: 02/05/2012  Problem List:  Patient Active Problem List  Diagnoses  . COPD (chronic obstructive pulmonary disease)    Past Medical History:  Past Medical History  Diagnosis Date  . COPD (chronic obstructive pulmonary disease)   . Chronic headache   . Seasonal allergies   . Asthma    Past Surgical History:  Past Surgical History  Procedure Date  . Nasal septoplasty w/ turbinoplasty 1983     SATURATION QUALIFICATIONS:  Patient Saturations on Room Air at Rest = 93%  Patient Saturations on Room Air while Ambulating = 90%  PT Assessment/Plan/Recommendation PT Assessment Clinical Impression Statement: Pt presents with diagnosis of COPD with exacerbation. Mobilizing well-limited by coughing spells and dyspnea. Reviewed some energy conservation techniques. Encouraged pt to slowly progress level of activity if at all possible. 1x eval. No follow-up PT needed.  PT Recommendation/Assessment: Patent does not need any further PT services No Skilled PT: Patient is modified independent with all activity/mobility;All education completed PT Recommendation Follow Up Recommendations: No PT follow up Equipment Recommended: None recommended by PT PT Goals     PT Evaluation Precautions/Restrictions    Prior Functioning  Home Living Lives With: Alone Receives Help From: Family-if needed (per pt) Type of Home: House Home Layout: One level Home Access: Stairs to enter Entrance Stairs-Rails: Right Entrance Stairs-Number of Steps: 4 Home Adaptive Equipment: None Prior Function Level of Independence: Independent with basic ADLs;Independent with transfers;Independent with homemaking with ambulation;Independent with gait Driving: Yes 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 Cognition Cognition Arousal/Alertness:  Awake/alert Overall Cognitive Status: Appears within functional limits for tasks assessed Sensation/Coordination Sensation Light Touch: Appears Intact Coordination Gross Motor Movements are Fluid and Coordinated: Yes Extremity Assessment RLE Assessment RLE Assessment: Within Functional Limits LLE Assessment LLE Assessment: Within Functional Limits Mobility (including Balance) Bed Mobility Bed Mobility: Yes Supine to Sit: 7: Independent Transfers Transfers: Yes Sit to Stand: 7: Independent Stand to Sit: 7: Independent Ambulation/Gait Ambulation/Gait: Yes Ambulation/Gait Assistance: 7: Independent Ambulation/Gait Assistance Details (indicate cue type and reason): Mobilizing well. Dyspnea 2-3/4 with activity. No LOB. VCs for pursed lip breathing. Ambulation Distance (Feet): 150 Feet (x 2. Seated rest break. ) Assistive device: None Gait Pattern: Within Functional Limits Stairs: Yes Stairs Assistance: 6: Modified independent (Device/Increase time) Stair Management Technique: One rail Right Number of Stairs: 4   Posture/Postural Control Posture/Postural Control: No significant limitations Balance Balance Assessed:  (NO LOB or unsteadines noted with activity) Exercise    End of Session PT - End of Session Equipment Utilized During Treatment: Gait belt Activity Tolerance: Patient tolerated treatment well (Limited by coughing spells and dyspnea) Patient left: in chair;with call bell in reach;with family/visitor present General Behavior During Session: [facility] for tasks performed Cognition: [facility] for tasks performed  Rebeca Alert [facility] 02/05/2012, 9:52 AM [phone]

## 2012-02-06 DIAGNOSIS — J441 Chronic obstructive pulmonary disease with (acute) exacerbation: Secondary | ICD-10-CM | POA: Diagnosis present

## 2012-02-06 MED ORDER — SALINE SPRAY 0.65 % NA SOLN: 1.0000 | Freq: Two times a day (BID) | NASAL | Status: DC

## 2012-02-06 MED ORDER — FLUTICASONE PROPIONATE 50 MCG/ACT NA SUSP: 2.0000 | Freq: Every day | NASAL | Status: DC

## 2012-02-06 MED ORDER — ALBUTEROL SULFATE (5 MG/ML) 0.5% IN NEBU: 2.5000 mg | INHALATION_SOLUTION | Freq: Four times a day (QID) | RESPIRATORY_TRACT | Status: DC

## 2012-02-06 MED ORDER — PREDNISONE 10 MG PO TABS: ORAL_TABLET | ORAL | Status: DC

## 2012-02-06 MED ORDER — DOXYCYCLINE HYCLATE 100 MG PO TABS: 100.0000 mg | ORAL_TABLET | Freq: Two times a day (BID) | ORAL | Status: AC

## 2012-02-06 MED ORDER — IPRATROPIUM BROMIDE 0.02 % IN SOLN: 0.5000 mg | Freq: Four times a day (QID) | RESPIRATORY_TRACT | Status: DC

## 2012-02-06 NOTE — Discharge Summary (Signed)
Physician Discharge Summary  Patient ID: Theodore [name] MRN: [id number] DOB/AGE: 1956/10/05 56 y.o.  Admit date: 02/03/2012 Discharge date: 02/06/2012    Discharge Diagnoses:  Active Problems:  COPD with acute exacerbation    Brief Summary: Theodore [name] is a 56 y.o. y/o male smoker admitted on 02/03/2012 with GOLD 4 COPD with acute exacerbation.  Treated initially with IV antibiotics, nebulized bronchodilators, oxygen, pulmonary hygiene and IV steroids.  Also given sinus regimen which he believes has helped. Has tolerated transition to PO doxycycline and PO prednisone. At time of d/c appears back to baseline resp status.  Will cont his nebulized BD as he has access to a neb machine at home.  Likely transition back to his previous spiriva at next oupt visit.  Will cont doxy x 7 more days and po pred taper with outpt f/u in April with Dr. Sherene Sires.  Smoking cessation encouraged throughout admission.    Antibiotics:  Rocephin 3/27>>3/28  Zithromax 3/27>>3/28  Doxycycline 3/28>> total 10 days  Tests/events:  12/02/11 PFT>>FEV1 1.10 (28%), FEV1% 37, TLC 10.44 (128%), DLCO 60%, + BD response   Discharge Labs  BMET  Lab 02/05/12 0525 02/03/12 2022  NA 135 132*  K 4.4 4.1  CL 101 95*  CO2 26 25  GLUCOSE 136* 93  BUN 20 14  CREATININE 0.77 0.77  CALCIUM 9.6 9.5  MG -- 1.8  PHOS -- 2.6     CBC   Lab 02/03/12 2022  HGB 13.8  HCT 40.9  WBC 10.7*  PLT 266     Discharge Orders    Future Appointments: Provider: Department: Dept Phone: Center:   03/01/2012 9:00 AM Nyoka Cowden, MD [facility] [phone] None     Future Orders Please Complete By Expires   Diet - low sodium heart healthy      Increase activity slowly          [place], [place]  Home Medication Instructions [id number]   Printed on:02/06/12 1419  Medication Information                    levalbuterol (XOPENEX HFA) 45 MCG/ACT inhaler Inhale 1 puff into the lungs every 4 (four) hours as  needed for wheezing.           dextromethorphan-guaiFENesin (MUCINEX DM) 30-600 MG per 12 hr tablet Take 1 tablet by mouth every 12 (twelve) hours.           doxycycline (VIBRA-TABS) 100 MG tablet Take 1 tablet (100 mg total) by mouth 2 (two) times daily.           albuterol (PROVENTIL) (5 MG/ML) 0.5% nebulizer solution Take 0.5 mLs (2.5 mg total) by nebulization every 6 (six) hours.           ipratropium (ATROVENT) 0.02 % nebulizer solution Take 2.5 mLs (0.5 mg total) by nebulization every 6 (six) hours.           predniSONE (DELTASONE) 10 MG tablet 4 tabs PO daily x 3 days then 3 tabs PO daily x 3 days then 2 tabs PO daily x 3 days then 1 tab PO daily x 3 days then STOP           sodium chloride (OCEAN) 0.65 % SOLN nasal spray Place 1 spray into the nose 2 (two) times daily.           fluticasone (FLONASE) 50 MCG/ACT nasal spray Place 2 sprays into the nose daily.  Disposition: Final discharge disposition not confirmed  Discharged Condition: Theodore [name] has met maximum benefit of inpatient care and is medically stable and cleared for discharge.  Patient is pending follow up as above.      Time spent on disposition:  Greater than 35 minutes.   SignedDanford Bad, NP 02/06/2012  2:20 PM Pager: [phone]  *Care during the described time interval was provided by me and/or other providers on the critical care team. I have reviewed this patient's available data, including medical history, events of note, physical examination and test results as part of my evaluation.  I independently interviewed and examined this patient and communicated with the NP in determining discharge plans.

## 2012-02-06 NOTE — Progress Notes (Signed)
Theodore [name] is a 56 y.o. male smoker admitted on 02/03/2012 with GOLD 4 COPD with acute exacerbation. PMHx Headaches, Seasonal allergies, Asthma  Antibiotics: Rocephin 3/27>>3/28 Zithromax 3/27>>3/28 Doxycycline 3/28>>  Tests/events: 12/02/11 PFT>>FEV1 1.10 (28%), FEV1% 37, TLC 10.44 (128%), DLCO 60%, + BD response  SUBJECTIVE: Still has intermittent cough, but decrease.  Expectoration is easier.  Not as much wheeze.  Sinus congestion improving.  Walked in hall with PT w/o difficulty. Will be staying with mother for awhile, since father died last week. She has father's old nebulizer. He feels much improved, with pending April office f/u w/ Dr Sherene Sires.  OBJECTIVE:  Blood pressure 139/73, pulse 75, temperature 97.9 F (36.6 C), temperature source Oral, resp. rate 18, height 6\' 4"  (1.93 m), weight 85.6 kg (188 lb 11.4 oz), SpO2 93.00%. Wt Readings from Last 3 Encounters:  02/03/12 85.6 kg (188 lb 11.4 oz)  02/03/12 86.093 kg (189 lb 12.8 oz)  12/02/11 85.276 kg (188 lb)   Body mass index is 22.97 kg/(m^2).  I/O last 3 completed shifts: In: 540 [P.O.:540] Out: 3225 [Urine:3225]  Physical Exam: General - no distress HEENT - clear nasal discharge, no sinus tenderness, no LAN Cardiac - s1s2 regular, no murmur Chest - clear and unlabored with no cough on deep breath. 93% on room air., Abd - soft, nontender Ext - no edema Neuro - normal strength Psych - normal mood, behavior  CBC    Component Value Date/Time   WBC 10.7* 02/03/2012 2022   RBC 4.34 02/03/2012 2022   HGB 13.8 02/03/2012 2022   HCT 40.9 02/03/2012 2022   PLT 266 02/03/2012 2022   MCV 94.2 02/03/2012 2022   MCH 31.8 02/03/2012 2022   MCHC 33.7 02/03/2012 2022   RDW 13.2 02/03/2012 2022   LYMPHSABS 1.4 02/03/2012 2022   MONOABS 1.2* 02/03/2012 2022   EOSABS 0.0 02/03/2012 2022   BASOSABS 0.0 02/03/2012 2022    BMET    Component Value Date/Time   NA 135 02/05/2012 0525   K 4.4 02/05/2012 0525   CL 101 02/05/2012 0525    CO2 26 02/05/2012 0525   GLUCOSE 136* 02/05/2012 0525   BUN 20 02/05/2012 0525   CREATININE 0.77 02/05/2012 0525   CALCIUM 9.6 02/05/2012 0525   GFRNONAA >90 02/05/2012 0525   GFRAA >90 02/05/2012 0525    Lab Results  Component Value Date   ALT 15 02/03/2012   AST 18 02/03/2012   ALKPHOS 67 02/03/2012   BILITOT 0.8 02/03/2012   ABG    Component Value Date/Time   PHART 7.462* 02/03/2012 2026   PCO2ART 31.2* 02/03/2012 2026   PO2ART 79.1* 02/03/2012 2026   HCO3 22.0 02/03/2012 2026   TCO2 19.2 02/03/2012 2026   ACIDBASEDEF 0.5 02/03/2012 2026   O2SAT 97.9 02/03/2012 2026    No results found.  ASSESSMENT/PLAN:  AECOPD with asthmatic bronchitis and rhinitis; no evidence for pneumonia clinically or on CXR -D3/10 Abx, currently on doxcycline -changed solumedrol to prednisone -continue scheduled nebulizer therapy -increase activity as tolerated  -continue sinus regimen with nasal irrigation and flonase -will need to determine if he has prior evaluation for Alpha 1 Anti-trypsin deficiency>>defer testing now with acute process  Tobacco abuse -he reports intermittently smoking few cigarettes -explained that he needs to avoid all tobacco products completely. Reinforced this issue.  Hyponatremia resolved 3/29  Disposition I think we can get him home, significantly improved. To f/u with Dr Sherene Sires- call office earlier if needed.  Waymon Budge Pager:  [phone]  02/06/2012, 12:49 PM

## 2012-02-06 NOTE — Progress Notes (Signed)
Gave pt discharge instructions and prescription and explained them to him. Pt verbalized understanding of instructions given. Pt left in no acute distress.

## 2012-02-08 ENCOUNTER — Telehealth: Payer: Self-pay | Admitting: Internal Medicine

## 2012-02-08 NOTE — Telephone Encounter (Signed)
I spoke with pt and is aware of MW recs. He voiced his understanding and had no questions. Pt stated he did not need rx and is scheduled for HFU 02/19/12 at 9 am w/ TP. He is aware to bring all meds with him. Nothing further was needed

## 2012-02-08 NOTE — Telephone Encounter (Signed)
I spoke with pt and he stated on his D/C note the dulera was not on the medications. He states he thought he was under the impression he was to continue this but isn't sure now. I looked on pt med list and this is no longer on his list. Also pt stated he needs an rx for the nebulizer machine bc he found out he did not have one at home like he thought he did. I advised will leave rx upfront for pick up. He voiced his understanding. Dr. Sherene Sires, please advise regarding the dulera, thanks

## 2012-02-08 NOTE — Telephone Encounter (Signed)
Should resume the dulera Take 2 puffs first thing in am and then another 2 puffs about 12 hours later.    and bring all active meds to office to see tammy or me w/in 2 weeks of discharge

## 2012-02-19 ENCOUNTER — Encounter: Payer: Self-pay | Admitting: Adult Health

## 2012-02-19 ENCOUNTER — Ambulatory Visit (INDEPENDENT_AMBULATORY_CARE_PROVIDER_SITE_OTHER): Payer: 59 | Admitting: Adult Health

## 2012-02-19 VITALS — BP 126/84 | HR 79 | Temp 98.8°F | Ht 76.0 in | Wt 194.8 lb

## 2012-02-19 DIAGNOSIS — J449 Chronic obstructive pulmonary disease, unspecified: Secondary | ICD-10-CM

## 2012-02-19 MED ORDER — TIOTROPIUM BROMIDE MONOHYDRATE 18 MCG IN CAPS: 18.0000 ug | ORAL_CAPSULE | Freq: Every day | RESPIRATORY_TRACT | Status: DC

## 2012-02-19 MED ORDER — ALBUTEROL SULFATE (2.5 MG/3ML) 0.083% IN NEBU: 2.5000 mg | INHALATION_SOLUTION | RESPIRATORY_TRACT | Status: DC | PRN

## 2012-02-19 MED ORDER — ALBUTEROL SULFATE (5 MG/ML) 0.5% IN NEBU: 2.5000 mg | INHALATION_SOLUTION | RESPIRATORY_TRACT | Status: DC | PRN

## 2012-02-19 NOTE — Progress Notes (Signed)
Addended by: Boone Master E on: 02/19/2012 03:16 PM   Modules accepted: Orders

## 2012-02-19 NOTE — Assessment & Plan Note (Signed)
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 -away from home  May use Albuterol NEB every 4 hr as needed for wheezing , trouble breathing - at home  Continue on [facility] 2 puffs Twice daily   follow up Dr. Sherene Sires  In 4 weeks and As needed   Please contact office for sooner follow up if symptoms do not improve or worsen or seek emergency care

## 2012-02-19 NOTE — Progress Notes (Signed)
Subjective:    Patient ID: Theodore [name], male    DOB: 25-Apr-1956, 56 y.o.   MRN: [id number]  HPI 47 yowm quit smoking around 2004 p onset of breathing difficulty with doe since flu in 1994 and slowly downhill since quit smoking in terms of best day function so referred to Pulmonary clinic 09/2011 by Dr Reggy Eye  09/28/2011 1st pulmonary eval cc persistent doe x 8 years slowly progressive with best days now moderate pace x mall ok and some days much worse despite spriva and foradil daily. Minimally better from albuterol.  Much better resp symptoms p prednisone for cluster ha, def improvement in head congestion, also less cough with prednsione.   Presently having more cough at hs > white mucus, freq exac in am's, better p coffeee and gets around to taking am meds only an hour or two p rising. rec Please remember to go to the  x-ray department downstairs for your tests - we will call you with the results when they are available. Stop foradil Start symbicort 160  Take 2 puffs first thing in am and then another 2 puffs about 12 hours later.  Work on inhaler technique:  .    Prednisone 10 mg take  4 each am x 2 days,   2 each am x 2 days,  1 each am x2days and stop Please schedule a follow up office visit in 4 weeks, sooner if needed with pft's   12/02/2011 f/u ov/Wert cc better on prednisone then some worse off it but overall  Better vs baseline doe. No cough  Sleeping ok without nocturnal  exacerbation  of respiratory  c/o's or need for noct saba.  Also denies any obvious fluctuation of symptoms with weather or environmental changes or other aggravating or alleviating factors except as outlined above   No previous h/o allergies or asthma as child or adult, no overt HB  Dulera 200 Take 2 puffs first thing in am and then another 2 puffs about 12 hours later and if you like it fill the prescription  Continue spiriva each am only - take this immediately after dulera  Work on [employer]  perfect inhaler technique: relax and gently blow all the way out then take a nice smooth deep breath back in, triggering the inhaler at same time you start breathing in. Hold for up to 5 seconds if you can. Rinse and gargle with water when done  If your mouth or throat starts to bother you, I suggest you time the inhaler to your dental care and after using the inhaler(s) brush teeth and tongue with a baking soda containing toothpaste and when you rinse this out, gargle with it first to see if this helps your mouth and throat.  Avoid salt and monitor your blood pressure and follow up with your primary doctor  For cough mucinex dm  Only use your albuterol as a rescue medication to be used if you can't catch your breath by resting or doing a relaxed purse lip breathing pattern. The less you use it, the better it will work when you need it.  Please schedule a follow up visit in 3 months but call sooner if needed   OV 02/03/2012  COPD  Baseline   - PFTs 12/02/11: Fev1 0.94/24% with 18% BD response. TLC 128% and DLCO 60% . Maintainted on dulera and spiriva. AT baseline says last prednsione burst was nov 2012 and only one pred burst past year. Last hospitalizaiton was when he was  a child. 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   Currently  - 4 days ago worked hard at [employer] in [facility]. Then 3 days ago had suddent onset dyspnea, cough, wheeze. Rates it as severe. Progressive. Very dyspneic. Assocuated white sputum with yellow hue present. Orthopneic +. Similar to episode in nov 2012 when prednisone helped.  Denies fever, chest pain, nause, vomit, diarrhea, edema >>Admitted   02/19/2012 [facility]  Patient returns for a post hospital followup. Patient was admitted March 27 of 02/06/2012 for a COPD exacerbation. He was treated with IV antibiotics, nebulized bronchodilators, IV steroids. He was discharged on doxycycline and a steroid taper. Chest x-ray showed COPD changes  without any acute process. He was changed off his Spiriva to Atrovent nebulizer. He was restarted on Dulera  2 puffs twice daily.   Since discharge. Patient feels improved w/ decreased cough and congestion .  He has not smoked x4 weeks. He has finished all his antibiotics and steroids. He will not be able to use atrovent neb due to work schedule.       Review of Systems  Constitutional: Negative for fever and unexpected weight change.  HENT: Negative for ear pain, nosebleeds, congestion, sore throat, rhinorrhea, sneezing, trouble swallowing, dental problem, postnasal drip and sinus pressure.   Eyes: Negative for redness and itching.  Respiratory:  . Negative for wheezing.   Cardiovascular: Negative for palpitations and leg swelling.  Gastrointestinal: Negative for nausea and vomiting.  Genitourinary: Negative for dysuria.  Musculoskeletal: Negative for joint swelling.  Skin: Negative for rash.  Neurological: Negative for headaches.  Hematological: Does not bruise/bleed easily.  Psychiatric/Behavioral: Negative for dysphoric mood. The patient is not nervous/anxious.        Objective:     GEN: A/Ox3; pleasant , NAD, well nourished   HEENT:  [place]/AT,  EACs-clear, TMs-wnl, NOSE-clear, THROAT-clear, no lesions, no postnasal drip or exudate noted.   NECK:  Supple w/ fair ROM; no JVD; normal carotid impulses w/o bruits; no thyromegaly or nodules palpated; no lymphadenopathy.  RESP  Clear  P & A; w/o, wheezes/ rales/ or rhonchi.no accessory muscle use, no dullness to percussion  CARD:  RRR, no m/r/g  , no peripheral edema, pulses intact, no cyanosis or clubbing.  GI:   Soft & nt; nml bowel sounds; no organomegaly or masses detected.  Musco: Warm bil, no deformities or joint swelling noted.   Neuro: alert, no focal deficits noted.    Skin: Warm, no lesions or rashes         Assessment & Plan:

## 2012-02-19 NOTE — Patient Instructions (Signed)
Restart Spiriva  1 puff daily  Stop Atrovent NEB  For Rescue USE ONLY::Try to rest first then if not improved may go to inhaler or neb  May use Xopenex Inhaler 2 puffs every 4 hrs As needed  Wheezing-/trouble breathing - this is your rescue inhaler. -away from home  May use Albuterol NEB every 4 hr as needed for wheezing , trouble breathing - at home  Continue on [facility] 2 puffs Twice daily   follow up Dr. Sherene Sires  In 4 weeks and As needed   Please contact office for sooner follow up if symptoms do not improve or worsen or seek emergency care

## 2012-03-01 ENCOUNTER — Encounter: Payer: Self-pay | Admitting: Internal Medicine

## 2012-03-01 ENCOUNTER — Ambulatory Visit (INDEPENDENT_AMBULATORY_CARE_PROVIDER_SITE_OTHER): Payer: 59 | Admitting: Internal Medicine

## 2012-03-01 VITALS — BP 114/70 | HR 67 | Temp 97.9°F | Ht 76.0 in | Wt 194.6 lb

## 2012-03-01 DIAGNOSIS — J449 Chronic obstructive pulmonary disease, unspecified: Secondary | ICD-10-CM

## 2012-03-01 DIAGNOSIS — F17200 Nicotine dependence, unspecified, uncomplicated: Secondary | ICD-10-CM

## 2012-03-01 MED ORDER — PREDNISONE (PAK) 10 MG PO TABS: ORAL_TABLET | ORAL | Status: AC

## 2012-03-01 NOTE — Patient Instructions (Addendum)
Prednisone 10 mg take  4 each am x 2 days,   2 each am x 2 days,  1 each am x2days and stop   Stop smoking completely before smoking completely stops you!  Only use your albuterol (plan B xopenex, Plan C is nebulizer) as a rescue medication to be used if you can't catch your breath by resting or doing a relaxed purse lip breathing pattern. The less you use it, the better it will work when you need it.  Ok to use it up to every 4 hours if needed while waiting for appointment  Work on perfecting  inhaler technique:  relax and gently blow all the way out then take a nice smooth deep breath back in, triggering the inhaler at same time you start breathing in.  Hold for up to 5 seconds if you can.  Rinse and gargle with water when done   If your mouth or throat starts to bother you,   I suggest you time the inhaler to your dental care and after using the inhaler(s) brush teeth and tongue with a baking soda containing toothpaste and when you rinse this out, gargle with it first to see if this helps your mouth and throat.     Please schedule a follow up office visit in 6 weeks, call sooner if needed

## 2012-03-01 NOTE — Progress Notes (Signed)
Subjective:    Patient ID: Theodore [name], male    DOB: 03-Nov-1956    MRN: [id number]  Brief patient profile:  59 yowm quit smoking around 2004 p onset of breathing difficulty with doe since flu in 1994 and slowly downhill since quit smoking in terms of best day function so referred to Pulmonary clinic 09/2011 by Dr Reggy Eye  09/28/2011 1st pulmonary eval cc persistent doe x 8 years slowly progressive with best days now moderate pace x mall ok and some days much worse despite spriva and foradil daily. Minimally better from albuterol.  Much better resp symptoms p prednisone for cluster ha, def improvement in head congestion, also less cough with prednsione.   Presently having more cough at hs > white mucus, freq exac in am's, better p coffeee and gets around to taking am meds only an hour or two p rising. rec Please remember to go to the  x-ray department downstairs for your tests - we will call you with the results when they are available. Stop foradil Start symbicort 160  Take 2 puffs first thing in am and then another 2 puffs about 12 hours later.  Work on inhaler technique:  .    Prednisone 10 mg take  4 each am x 2 days,   2 each am x 2 days,  1 each am x2days and stop Please schedule a follow up office visit in 4 weeks, sooner if needed with pft's   12/02/2011 f/u ov/Theodore [name] cc better on prednisone then some worse off it but overall  Better vs baseline doe. No cough  Sleeping ok without nocturnal  exacerbation  of respiratory  c/o's or need for noct saba.  Also denies any obvious fluctuation of symptoms with weather or environmental changes or other aggravating or alleviating factors except as outlined above   No previous h/o allergies or asthma as child or adult, no overt HB  Dulera 200 Take 2 puffs first thing in am and then another 2 puffs about 12 hours later and if you like it fill the prescription  Continue spiriva each am only - take this immediately after dulera  Work on  [employer] perfect inhaler technique   OV 02/03/2012 Theodore [name]  COPD  Baseline   - PFTs 12/02/11: Fev1 0.94/24% with 18% BD response. TLC 128% and DLCO 60% . Maintainted on dulera and spiriva. AT baseline says last prednsione burst was nov 2012 and only one pred burst past year. Last hospitalizaiton was when he was a child. 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   Currently  - 4 days ago worked hard at [employer] in [facility]. Then 3 days ago had suddent onset dyspnea, cough, wheeze. Rates it as severe. Progressive. Very dyspneic. Associated white sputum with yellow hue present. Orthopneic +. Similar to episode in nov 2012 when prednisone helped.  Denies fever, chest pain, nause, vomit, diarrhea, edema >>Admitted   02/19/2012 [facility]  Patient returns for a post hospital followup. Patient was admitted March 27 of 02/06/2012 for a COPD exacerbation. He was treated with IV antibiotics, nebulized bronchodilators, IV steroids. He was discharged on doxycycline and a steroid taper. Chest x-ray showed COPD changes without any acute process. He was changed off his Spiriva to Atrovent nebulizer. He was restarted on Dulera  2 puffs twice daily.   Since discharge. Patient feels improved w/ decreased cough and congestion .  He has not smoked x 4 weeks. He has finished all his  antibiotics and steroids. He will not be able to use atrovent neb due to work schedule.  rec Restart Spiriva  1 puff daily  Stop Atrovent NEB  For Rescue USE ONLY::Try to rest first then if not improved may go to inhaler or neb  May use Xopenex Inhaler 2 puffs every 4 hrs As needed  Wheezing-/trouble breathing - this is your rescue inhaler. -away from home  May use Albuterol NEB every 4 hr as needed for wheezing , trouble breathing - at home  Continue on Dulera 2 puffs Twice daily    03/01/2012 f/u ov/Theodore [name] cc breathing tends worse daily (vs on prednisone) toward end of shift (2pm to 830 pm).   Wakes up each am with rattling producing nothing @  Nl wake up time and not much albuterol use daytime in any form.  Prev aecopd ? Due to change to foradil instead of maintaining dulera and increased cig use which continues  "no more than 1-2 per week"  Sleeping ok without nocturnal  or early am exacerbation  of respiratory  c/o's or need for noct saba. Also denies any obvious fluctuation of symptoms with weather or environmental changes or other aggravating or alleviating factors except as outlined above.  ROS  At present neg for  any significant sore throat, dysphagia, dental problems, itching, sneezing,  nasal congestion or excess/ purulent secretions, ear ache,   fever, chills, sweats, unintended wt loss, pleuritic or exertional cp, hemoptysis, palpitations, orthopnea pnd or leg swelling.  Also denies presyncope, palpitations, heartburn, abdominal pain, anorexia, nausea, vomiting, diarrhea  or change in bowel or urinary habits, change in stools or urine, dysuria,hematuria,  rash, arthralgias, visual complaints, headache, numbness weakness or ataxia or problems with walking or coordination. No noted change in mood/affect or memory.                            Objective:     GEN: A/Ox3; pleasant , NAD, well nourished   Wt Readings from Last 3 Encounters:  03/01/12 194 lb 9.6 oz (88.27 kg)  02/19/12 194 lb 12.8 oz (88.361 kg)  02/03/12 188 lb 11.4 oz (85.6 kg)    HEENT:  [place]/AT,  EACs-clear, TMs-wnl, NOSE-clear, THROAT-clear, no lesions, no postnasal drip or exudate noted.   NECK:  Supple w/ fair ROM; no JVD; normal carotid impulses w/o bruits; no thyromegaly or nodules palpated; no lymphadenopathy.  RESP  Clear  P & A; w/o, wheezes/ rales/ or rhonchi.no accessory muscle use, no dullness to percussion  CARD:  RRR, no m/r/g  , no peripheral edema, pulses intact, no cyanosis or clubbing.  GI:   Soft & nt; nml bowel sounds; no organomegaly or masses detected.  Musco: Warm bil, no  deformities or joint swelling noted.   Neuro: alert, no focal deficits noted.    Skin: Warm, no lesions or rashes  cxr 02/04/12 IMPRESSION:  COPD again noted.  No evidence of acute abnormality.         Assessment & Plan:

## 2012-03-03 DIAGNOSIS — F17200 Nicotine dependence, unspecified, uncomplicated: Secondary | ICD-10-CM | POA: Insufficient documentation

## 2012-03-03 NOTE — Assessment & Plan Note (Signed)
-   HFA 75% p coaching 03/01/12   - PFT's 12/02/2011  FEV1  0.94 (24%)  But 18% better p B 2 and DLCO 60%    -Alpha 1 genotype sent 03/01/12 >>>  GOLD IV severity, freq exac and still smoking  DDX of  difficult airways managment all start with A and  include Adherence, Ace Inhibitors, Acid Reflux, Active Sinus Disease, Alpha 1 Antitripsin deficiency, Anxiety masquerading as Airways dz,  ABPA,  allergy(esp in young), Aspiration (esp in elderly), Adverse effects of DPI,  Active smokers, plus two Bs  = Bronchiectasis and Beta blocker use..and one C= CHF  Adherence is always the initial "prime suspect" and is a multilayered concern that requires a "trust but verify" approach in every patient - starting with knowing how to use medications, especially inhalers, correctly, keeping up with refills and understanding the fundamental difference between maintenance and prns vs those medications only taken for a very short course and then stopped and not refilled. The proper method of use, as well as anticipated side effects, of a metered-dose inhaler are discussed and demonstrated to the patient. Improved effectiveness after extensive coaching during this visit to a level of approximately  75%  Active smoking discussed separtately     Each maintenance medication was reviewed in detail including most importantly the difference between maintenance and as needed and under what circumstances the prns are to be used.  Please see instructions for details which were reviewed in writing and the patient given a copy.

## 2012-03-03 NOTE — Assessment & Plan Note (Signed)

## 2012-03-04 ENCOUNTER — Encounter: Payer: Self-pay | Admitting: Adult Health

## 2012-03-09 ENCOUNTER — Telehealth: Payer: Self-pay | Admitting: Adult Health

## 2012-03-09 NOTE — Telephone Encounter (Signed)
Per TP: nml, MM > neg genetic cause.  LMOM TCB x1.

## 2012-03-10 NOTE — Telephone Encounter (Signed)
I spoke with patient about results and he verbalized understanding and had no questions 

## 2012-03-17 ENCOUNTER — Encounter: Payer: Self-pay | Admitting: Adult Health

## 2012-04-07 ENCOUNTER — Other Ambulatory Visit: Payer: Self-pay | Admitting: Internal Medicine

## 2012-04-20 ENCOUNTER — Ambulatory Visit (INDEPENDENT_AMBULATORY_CARE_PROVIDER_SITE_OTHER): Payer: 59 | Admitting: Internal Medicine

## 2012-04-20 ENCOUNTER — Encounter: Payer: Self-pay | Admitting: Internal Medicine

## 2012-04-20 VITALS — BP 130/80 | HR 85 | Temp 97.6°F | Ht 76.0 in | Wt 195.6 lb

## 2012-04-20 DIAGNOSIS — J449 Chronic obstructive pulmonary disease, unspecified: Secondary | ICD-10-CM

## 2012-04-20 DIAGNOSIS — J4489 Other specified chronic obstructive pulmonary disease: Secondary | ICD-10-CM

## 2012-04-20 NOTE — Progress Notes (Signed)
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 Minimally better from albuterol.  Much better resp symptoms p prednisone for cluster ha, def improvement in head congestion, also less cough with prednsione.   cc having more cough at hs > white mucus, freq exac in am's, better p coffeee and gets around to taking am meds only an hour or two p rising. rec Stop foradil Start symbicort 160  Take 2 puffs first thing in am and then another 2 puffs about 12 hours later.  Work on inhaler technique:  .    Prednisone 10 mg take  4 each am x 2 days,   2 each am x 2 days,  1 each am x2days and stop Please schedule a follow up office visit in 4 weeks, sooner if needed with pft's   12/02/2011 f/u ov/Theodore [name] cc better on prednisone then some worse off it but overall  Better vs baseline doe. No cough rec Dulera 200 Take 2 puffs first thing in am and then another 2 puffs about 12 hours later and if you like it fill the prescription  Continue spiriva each am only - take this immediately after dulera  Work on [employer] perfect inhaler technique      02/19/2012  post hospital followup. Patient was admitted March 27 of 02/06/2012 for a COPD exacerbation. He was treated with IV antibiotics, nebulized bronchodilators, IV steroids. He was discharged on doxycycline and a steroid taper. Chest x-ray showed COPD changes without any acute process. He was changed off his Spiriva to Atrovent nebulizer. He was restarted on Dulera  2 puffs  twice daily.  Since discharge. Patient feels improved w/ decreased cough and congestion .  He has not smoked x 4 weeks. He has finished all his antibiotics and steroids. He will not be able to use atrovent neb due to work schedule.  rec Restart Spiriva  1 puff daily  Stop Atrovent NEB  For Rescue USE ONLY::Try to rest first then if not improved may go to inhaler or neb  May use Xopenex Inhaler 2 puffs every 4 hrs As needed  Wheezing-/trouble breathing - this is your rescue inhaler. -away from home  May use Albuterol NEB every 4 hr as needed for wheezing , trouble breathing - at home  Continue on Dulera 2 puffs Twice daily     03/01/2012 f/u ov/Theodore [name] cc breathing tends worse daily (vs on prednisone) toward end of shift (2pm to 830 pm).  Wakes up each am with rattling producing nothing @  Nl wake up time and not much albuterol use daytime in any form.  Prev aecopd ? Due to change to foradil instead of maintaining dulera and increased cig use which continues  "no more than 1-2 per week" rec Prednisone 10 mg take  4 each am x 2 days,   2 each am x 2 days,  1 each am x2days and stop  Stop smoking completely before smoking completely stops you! Only  use your albuterol (plan B xopenex, Plan C is nebulizer) as a rescue medication o be used up to every 4 hours if needed  Work on perfecting  inhaler technique   04/20/2012 f/u ov/Theodore [name] last cigarette 02/2012 overall no change doe and cough, no purulent sputum. No obvious daytime variabilty or assoc chronic cough or cp or chest tightness, subjective wheeze overt sinus or hb symptoms. No unusual exp hx .  Sleeping ok without nocturnal  or early am exacerbation  of respiratory  c/o's or need for noct saba. Also denies any obvious fluctuation of symptoms with weather or environmental changes or other aggravating or alleviating factors except as outlined above.  ROS  At present neg for  any significant sore throat, dysphagia, dental problems, itching,  sneezing,  nasal congestion or excess/ purulent secretions, ear ache,   fever, chills, sweats, unintended wt loss, pleuritic or exertional cp, hemoptysis, palpitations, orthopnea pnd or leg swelling.  Also denies presyncope, palpitations, heartburn, abdominal pain, anorexia, nausea, vomiting, diarrhea  or change in bowel or urinary habits, change in stools or urine, dysuria,hematuria,  rash, arthralgias, visual complaints, headache, numbness weakness or ataxia or problems with walking or coordination. No noted change in mood/affect or memory.                       Objective:     GEN: A/Ox3; pleasant , NAD, well nourished  Wt 04/22/12 195  Wt Readings from Last 3 Encounters:  03/01/12 194 lb 9.6 oz (88.27 kg)  02/19/12 194 lb 12.8 oz (88.361 kg)  02/03/12 188 lb 11.4 oz (85.6 kg)    HEENT:  [place]/AT,  EACs-clear, TMs-wnl, NOSE-clear, THROAT-clear, no lesions, no postnasal drip or exudate noted.   NECK:  Supple w/ fair ROM; no JVD; normal carotid impulses w/o bruits; no thyromegaly or nodules palpated; no lymphadenopathy.  RESP  Clear  P & A; w/o, wheezes/ rales/ or rhonchi.no accessory muscle use, no dullness to percussion  CARD:  RRR, no m/r/g  , no peripheral edema, pulses intact, no cyanosis or clubbing.  GI:   Soft & nt; nml bowel sounds; no organomegaly or masses detected.  Musco: Warm bil, no deformities or joint swelling noted.   Neuro: alert, no focal deficits noted.    Skin: Warm, no lesions or rashes  cxr 02/04/12 IMPRESSION:  COPD again noted.  No evidence of acute abnormality.         Assessment & Plan:

## 2012-04-20 NOTE — Patient Instructions (Addendum)
Congratulations on not smoking - it's the most important aspect of your care   For cough > mucinex 600 up to  2 every 12 hours  For breathing xopenex hfa(your inhaler) up to 2 puffs every to 4 hours and only use the nebulizer if can't catch your breathing after the inhaler  Please schedule a follow up visit in 3 months but call sooner if needed

## 2012-04-22 NOTE — Assessment & Plan Note (Signed)
-   HFA 75% p coaching 03/01/12 > 90% 04/20/2012    - PFT's 12/02/2011  FEV1  0.94 (24%)  But 18% better p B 2 and DLCO 60%    -Alpha 1 genotype sent 03/01/12 >  MM   I had an extended discussion with the patient today lasting 15 to 20 minutes of a 25 minute visit on the following issues:   GOLD IV with tendency to aecopd but seems to have plateaued off cigarettes, the most important aspect of his care along with understanding maint vs prns and how to use saba appropriately  See instructions for specific recommendations which were reviewed directly with the patient who was given a copy with highlighter outlining the key components.

## 2012-04-29 ENCOUNTER — Other Ambulatory Visit (HOSPITAL_COMMUNITY): Payer: Self-pay | Admitting: Internal Medicine

## 2012-07-16 ENCOUNTER — Other Ambulatory Visit: Payer: Self-pay | Admitting: Internal Medicine

## 2012-07-27 ENCOUNTER — Ambulatory Visit (INDEPENDENT_AMBULATORY_CARE_PROVIDER_SITE_OTHER): Payer: 59 | Admitting: Internal Medicine

## 2012-07-27 ENCOUNTER — Encounter: Payer: Self-pay | Admitting: Internal Medicine

## 2012-07-27 VITALS — BP 110/60 | HR 69 | Temp 97.4°F | Ht 76.0 in | Wt 194.4 lb

## 2012-07-27 DIAGNOSIS — Z23 Encounter for immunization: Secondary | ICD-10-CM

## 2012-07-27 DIAGNOSIS — J449 Chronic obstructive pulmonary disease, unspecified: Secondary | ICD-10-CM

## 2012-07-27 MED ORDER — PREDNISONE (PAK) 10 MG PO TABS: ORAL_TABLET | ORAL | Status: DC

## 2012-07-27 NOTE — Patient Instructions (Addendum)
Prednisone 10 mg take  4 each am x 2 days,   2 each am x 2 days,  1 each am x2days and stop   Congratulations on not smoking - it's the most important aspect of your care   For cough as needed  > mucinex dm 600 up to  2 every 12 hours  For breathing as needed  xopenex hfa(your inhaler) up to 2 puffs every to 4 hours and only use the nebulizer if can't catch your breathing after the inhaler  Please schedule a follow up visit in 3 months but call sooner if needed

## 2012-07-27 NOTE — Progress Notes (Signed)
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 Minimally better from albuterol.  Much better resp symptoms p prednisone for cluster ha, def improvement in head congestion, also less cough with prednsione.   cc having more cough at hs > white mucus, freq exac in am's, better p coffeee and gets around to taking am meds only an hour or two p rising. rec Stop foradil Start symbicort 160  Take 2 puffs first thing in am and then another 2 puffs about 12 hours later.  Work on inhaler technique:  .    Prednisone 10 mg take  4 each am x 2 days,   2 each am x 2 days,  1 each am x2days and stop Please schedule a follow up office visit in 4 weeks, sooner if needed with pft's   12/02/2011 f/u ov/Wert cc better on prednisone then some worse off it but overall  Better vs baseline doe. No cough rec Dulera 200 Take 2 puffs first thing in am and then another 2 puffs about 12 hours later and if you like it fill the prescription  Continue spiriva each am only - take this immediately after dulera  Work on [employer] perfect inhaler technique      02/19/2012  post hospital followup. Patient was admitted March 27 of 02/06/2012 for a COPD exacerbation. He was treated with IV antibiotics, nebulized bronchodilators, IV steroids. He was discharged on doxycycline and a steroid taper. Chest x-ray showed COPD changes without any acute process. He was changed off his Spiriva to Atrovent nebulizer. He was restarted on Dulera  2 puffs  twice daily.  Since discharge. Patient feels improved w/ decreased cough and congestion .  He has not smoked x 4 weeks. He has finished all his antibiotics and steroids. He will not be able to use atrovent neb due to work schedule.  rec Restart Spiriva  1 puff daily  Stop Atrovent NEB  For Rescue USE ONLY::Try to rest first then if not improved may go to inhaler or neb  May use Xopenex Inhaler 2 puffs every 4 hrs As needed  Wheezing-/trouble breathing - this is your rescue inhaler. -away from home  May use Albuterol NEB every 4 hr as needed for wheezing , trouble breathing - at home  Continue on Dulera 2 puffs Twice daily     03/01/2012 f/u ov/Wert cc breathing tends worse daily (vs on prednisone) toward end of shift (2pm to 830 pm).  Wakes up each am with rattling producing nothing @  Nl wake up time and not much albuterol use daytime in any form.  Prev aecopd ? Due to change to foradil instead of maintaining dulera and increased cig use which continues  "no more than 1-2 per week" rec Prednisone 10 mg take  4 each am x 2 days,   2 each am x 2 days,  1 each am x2days and stop  Stop smoking completely before smoking completely stops you! Only  use your albuterol (plan B xopenex, Plan C is nebulizer) as a rescue medication o be used up to every 4 hours if needed  Work on perfecting  inhaler technique   04/20/2012 f/u ov/Wert last cigarette 02/2012 overall no change doe and cough, no purulent sputum rec Congratulations on not smoking - it's the most important aspect of your care  For cough > mucinex 600 up to  2 every 12 hours For breathing xopenex hfa(your inhaler) up to 2 puffs every to 4 hours and only use the nebulizer if can't catch your breathing after the inhaler   07/27/2012 f/u ov/Wert still not smoking cc no change doe, using saba twice daily "when over do it it at work" and x few weeks more congested in am but no purulent sputum. No obvious daytime variabilty or assoc chronic cough  or cp or chest tightness, subjective wheeze overt sinus or hb symptoms. No unusual exp hx or h/o childhood pna/ asthma or premature birth to his knowledge.   Sleeping ok without nocturnal  or early am exacerbation  of respiratory  c/o's or need for noct saba. Also denies any obvious fluctuation of symptoms with weather or environmental changes or other aggravating or alleviating factors except as outlined above.  ROS  At present neg for  any significant sore throat, dysphagia, dental problems, itching, sneezing,  nasal congestion or excess/ purulent secretions, ear ache,   fever, chills, sweats, unintended wt loss, pleuritic or exertional cp, hemoptysis, palpitations, orthopnea pnd or leg swelling.  Also denies presyncope, palpitations, heartburn, abdominal pain, anorexia, nausea, vomiting, diarrhea  or change in bowel or urinary habits, change in stools or urine, dysuria,hematuria,  rash, arthralgias, visual complaints, headache, numbness weakness or ataxia or problems with walking or coordination. No noted change in mood/affect or memory.                       Objective:     GEN: A/Ox3; pleasant , NAD, well nourished  Wt 04/22/12 195 > 07/27/2012  194  Wt Readings from Last 3 Encounters:  03/01/12 194 lb 9.6 oz (88.27 kg)  02/19/12 194 lb 12.8 oz (88.361 kg)  02/03/12 188 lb 11.4 oz (85.6 kg)    HEENT:  [place]/AT,  EACs-clear, TMs-wnl, NOSE-clear, THROAT-clear, no lesions, no postnasal drip or exudate noted.    NECK:  Supple w/ fair ROM; no JVD; normal carotid impulses w/o bruits; no thyromegaly or nodules palpated; no lymphadenopathy.  RESP insp/exp rhonchi bilaterally  CARD:  RRR, no m/r/g  , no peripheral edema, pulses intact, no cyanosis or clubbing.  GI:   Soft & nt; nml bowel sounds; no organomegaly or masses detected.  Musco: Warm bil, no deformities or joint swelling noted.   Neuro: alert, no focal deficits noted.    Skin: Warm, no lesions or rashes  cxr  02/04/12 IMPRESSION:  COPD again noted.  No evidence of acute abnormality.         Assessment & Plan:

## 2012-07-28 NOTE — Assessment & Plan Note (Addendum)
-   HFA 75% p coaching 03/01/12 > 90% 04/20/2012    - PFT's 12/02/2011  FEV1  0.94 (24%)  But 18% better p B 2 and DLCO 60%    -Alpha 1 genotype sent 03/01/12 >  MM   Mild flare non-purulent bronchitis > rx with prednisone x 6 days    Each maintenance medication was reviewed in detail including most importantly the difference between maintenance and as needed and under what circumstances the prns are to be used.  Please see instructions for details which were reviewed in writing and the patient given a copy.

## 2012-10-21 ENCOUNTER — Other Ambulatory Visit: Payer: Self-pay | Admitting: Internal Medicine

## 2012-10-21 NOTE — Telephone Encounter (Signed)
Rx denied- not on current med list

## 2012-10-24 ENCOUNTER — Ambulatory Visit (INDEPENDENT_AMBULATORY_CARE_PROVIDER_SITE_OTHER)
Admission: RE | Admit: 2012-10-24 | Discharge: 2012-10-24 | Disposition: A | Discharge status: Home / Self Care | Payer: 59 | Source: Ambulatory Visit | Attending: Internal Medicine | Admitting: Internal Medicine

## 2012-10-24 ENCOUNTER — Ambulatory Visit (INDEPENDENT_AMBULATORY_CARE_PROVIDER_SITE_OTHER): Payer: 59 | Admitting: Internal Medicine

## 2012-10-24 ENCOUNTER — Encounter: Payer: Self-pay | Admitting: Internal Medicine

## 2012-10-24 VITALS — BP 112/70 | HR 63 | Temp 97.6°F | Ht 76.0 in | Wt 196.0 lb

## 2012-10-24 DIAGNOSIS — J449 Chronic obstructive pulmonary disease, unspecified: Secondary | ICD-10-CM

## 2012-10-24 MED ORDER — TIOTROPIUM BROMIDE MONOHYDRATE 18 MCG IN CAPS: 18.0000 ug | ORAL_CAPSULE | Freq: Every day | RESPIRATORY_TRACT | Status: DC

## 2012-10-24 MED ORDER — PREDNISONE (PAK) 10 MG PO TABS: ORAL_TABLET | ORAL | Status: DC

## 2012-10-24 NOTE — Assessment & Plan Note (Addendum)
-   HFA 75% p coaching 03/01/12 > 90% 04/20/2012    - PFT's 12/02/2011  FEV1  0.94 (24%)  But 18% better p B 2 and DLCO 60%    -Alpha 1 genotype sent 03/01/12 >  MM   DDX of  difficult airways managment all start with A and  include Adherence, Ace Inhibitors, Acid Reflux, Active Sinus Disease, Alpha 1 Antitripsin deficiency, Anxiety masquerading as Airways dz,  ABPA,  allergy(esp in young), Aspiration (esp in elderly), Adverse effects of DPI,  Active smokers, plus two Bs  = Bronchiectasis and Beta blocker use..and one C= CHF   Adherence is always the initial "prime suspect" and is a multilayered concern that requires a "trust but verify" approach in every patient - starting with knowing how to use medications, especially inhalers, correctly, keeping up with refills and understanding the fundamental difference between maintenance and prns vs those medications only taken for a very short course and then stopped and not refilled. Not clear why he stopped spiriva s calling > restart   ? Active smoking > denies  See instructions for specific recommendations which were reviewed directly with the patient who was given a copy with highlighter outlining the key components.

## 2012-10-24 NOTE — Patient Instructions (Addendum)
Plan A Dulera and Spiriva are Plan A - take these "no matter what"  Only use your albuterol (plan B Xopenox/  Plan C is the nebulizer)  as   rescue medication to be used if you can't catch your breath by resting or doing a relaxed purse lip breathing pattern. The less you use it, the better it will work when you need it.   Prednisone 10 mg take  4 each am x 2 days,   2 each am x 2 days,  1 each am x2days and stop   Please remember to go to the x-ray department downstairs for your tests - we will call you with the results when they are available.  Please schedule a follow up visit in 3 months but call sooner if needed

## 2012-10-24 NOTE — Progress Notes (Signed)
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 Minimally better from albuterol.  Much better resp symptoms p prednisone for cluster ha, def improvement in head congestion, also less cough with prednsione.   cc having more cough at hs > white mucus, freq exac in am's, better p coffeee and gets around to taking am meds only an hour or two p rising. rec Stop foradil Start symbicort 160  Take 2 puffs first thing in am and then another 2 puffs about 12 hours later.  Work on inhaler technique:  .    Prednisone 10 mg take  4 each am x 2 days,   2 each am x 2 days,  1 each am x2days and stop Please schedule a follow up office visit in 4 weeks, sooner if needed with pft's   12/02/2011 f/u ov/Theodore [name] cc better on prednisone then some worse off it but overall  Better vs baseline doe. No cough rec Dulera 200 Take 2 puffs first thing in am and then another 2 puffs about 12 hours later and if you like it fill the prescription  Continue spiriva each am only - take this immediately after dulera  Work on [employer] perfect inhaler technique      02/19/2012  post hospital followup. Patient was admitted March 27 of 02/06/2012 for a COPD exacerbation. He was treated with IV antibiotics, nebulized bronchodilators, IV steroids. He was discharged on doxycycline and a steroid taper. Chest x-ray showed COPD changes without any acute process. He was changed off his Spiriva to Atrovent nebulizer. He was restarted on Dulera  2 puffs  twice daily.  Since discharge. Patient feels improved w/ decreased cough and congestion .  He has not smoked x 4 weeks. He has finished all his antibiotics and steroids. He will not be able to use atrovent neb due to work schedule.  rec Restart Spiriva  1 puff daily  Stop Atrovent NEB  For Rescue USE ONLY::Try to rest first then if not improved may go to inhaler or neb  May use Xopenex Inhaler 2 puffs every 4 hrs As needed  Wheezing-/trouble breathing - this is your rescue inhaler. -away from home  May use Albuterol NEB every 4 hr as needed for wheezing , trouble breathing - at home  Continue on Dulera 2 puffs Twice daily     03/01/2012 f/u ov/Theodore [name] cc breathing tends worse daily (vs on prednisone) toward end of shift (2pm to 830 pm).  Wakes up each am with rattling producing nothing @  Nl wake up time and not much albuterol use daytime in any form.  Prev aecopd ? Due to change to foradil instead of maintaining dulera and increased cig use which continues  "no more than 1-2 per week" rec Prednisone 10 mg take  4 each am x 2 days,   2 each am x 2 days,  1 each am x2days and stop  Stop smoking completely before smoking completely stops you!  Only use your albuterol (plan B xopenex, Plan C is nebulizer) as a rescue medication o be used up to every 4 hours if needed  Work on perfecting  inhaler technique   04/20/2012 f/u ov/Theodore [name] last cigarette 02/2012 overall no change doe and cough, no purulent sputum rec Congratulations on not smoking - it's the most important aspect of your care  For cough > mucinex 600 up to  2 every 12 hours For breathing xopenex hfa(your inhaler) up to 2 puffs every to 4 hours and only use the nebulizer if can't catch your breathing after the inhaler   07/27/2012 f/u ov/Theodore [name] still not smoking cc no change doe, using saba twice daily "when over do it it at work" and x few weeks more congested in am but no purulent sputum. rec Prednisone 10 mg take  4 each am x 2 days,   2  each am x 2 days,  1 each am x2days and stop  Congratulations on not smoking - it's the most important aspect of your care  For cough as needed  > mucinex dm 600 up to  2 every 12 hours For breathing as needed  xopenex hfa(your inhaler) up to 2 puffs every to 4 hours and only use the nebulizer if can't catch your breathing after the inhaler   10/24/2012 f/u ov/Samil [name] still not smoking but breathing worse with exertion x indolent onset x one week  p stopping spiriva with increased need for saba mdi and neb but still comfortable at rest.   No obvious daytime variabilty or assoc chronic cough or cp or chest tightness, subjective wheeze overt sinus or hb symptoms. No unusual exp hx or h/o childhood pna/ asthma or premature birth to his knowledge.   Sleeping ok without nocturnal  or early am exacerbation  of respiratory  c/o's or need for noct saba. Also denies any obvious fluctuation of symptoms with weather or environmental changes or other aggravating or alleviating factors except as outlined above.   ROS  The following are not active complaints unless bolded sore throat, dysphagia, dental problems, itching, sneezing,  nasal congestion or excess/ purulent secretions, ear ache,   fever, chills, sweats, unintended wt loss, pleuritic or exertional cp, hemoptysis,  orthopnea pnd or leg swelling, presyncope, palpitations, heartburn, abdominal pain, anorexia, nausea, vomiting, diarrhea  or change in bowel or urinary habits, change in stools or urine, dysuria,hematuria,  rash, arthralgias, visual complaints, headache, numbness weakness or ataxia or problems with walking or coordination,  change in mood/affect or memory.                         Objective:       amb wm  NAD, well nourished   Wt 188 01/09/12 > 04/22/12 195 > 07/27/2012  194 > 196 10/24/2012      HEENT mild turbinate edema.  Oropharynx no thrush or excess pnd or cobblestoning.  No JVD or cervical adenopathy. Mild accessory muscle  hypertrophy. Trachea midline, nl thryroid. Chest was hyperinflated by percussion with diminished breath sounds and moderate increased exp time without wheeze. Hoover sign positive at mid inspiration. Regular rate and rhythm without murmur gallop or rub or increase P2 or edema.  Abd: no hsm, nl excursion. Ext warm without cyanosis or clubbing.      CXR  10/24/2012 : No active disease. Hyperinflation again noted.    Assessment & Plan:

## 2012-10-25 NOTE — Progress Notes (Signed)
Quick Note:  Spoke with pt and notified of results per Dr. Wert. Pt verbalized understanding and denied any questions.  ______ 

## 2012-10-26 ENCOUNTER — Encounter: Payer: Self-pay | Admitting: Internal Medicine

## 2012-12-16 ENCOUNTER — Other Ambulatory Visit: Payer: Self-pay | Admitting: Internal Medicine

## 2013-01-23 ENCOUNTER — Ambulatory Visit (INDEPENDENT_AMBULATORY_CARE_PROVIDER_SITE_OTHER): Payer: BC Managed Care – PPO | Admitting: Internal Medicine

## 2013-01-23 ENCOUNTER — Encounter: Payer: Self-pay | Admitting: Internal Medicine

## 2013-01-23 VITALS — BP 122/88 | HR 99 | Temp 96.9°F | Ht 76.0 in | Wt 202.0 lb

## 2013-01-23 DIAGNOSIS — J4489 Other specified chronic obstructive pulmonary disease: Secondary | ICD-10-CM

## 2013-01-23 NOTE — Assessment & Plan Note (Addendum)
-   HFA 75% p coaching 03/01/12 > 90% 04/20/2012    - PFT's 12/02/2011  FEV1  0.94 (24%) ratio 39 -  18% response to B 2 and DLCO 60%    -Alpha 1 genotype sent 03/01/12 >  MM   Relatively well compensated with very little need for saba or mucinex.    Each maintenance medication was reviewed in detail including most importantly the difference between maintenance and as needed and under what circumstances the prns are to be used.  Please see instructions for details which were reviewed in writing and the patient given a copy.    The proper method of use, as well as anticipated side effects, of a metered-dose inhaler are discussed and demonstrated to the patient. Improved effectiveness after extensive coaching during this visit to a level of approximately  90%

## 2013-01-23 NOTE — Patient Instructions (Addendum)
Dulera and Spiriva are Plan A = automatic =  take these "no matter what"  Only use your albuterol (plan B Xopenox/  Plan C is the nebulizer)  as   rescue medication to be used if you can't catch your breath by resting or doing a relaxed purse lip breathing pattern. The less you use it, the better it will work when you need it.   For cough and congestion > mucinex up to 1200 mg every 12 hours.  Please schedule a follow up visit in 3 months but call sooner if needed

## 2013-01-23 NOTE — Progress Notes (Signed)
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 Minimally better from albuterol.  Much better resp symptoms p prednisone for cluster ha, def improvement in head congestion, also less cough with prednsione.   cc having more cough at hs > white mucus, freq exac in am's, better p coffeee and gets around to taking am meds only an hour or two p rising. rec Stop foradil Start symbicort 160  Take 2 puffs first thing in am and then another 2 puffs about 12 hours later.  Work on inhaler technique:  .    Prednisone 10 mg take  4 each am x 2 days,   2 each am x 2 days,  1 each am x2days and stop Please schedule a follow up office visit in 4 weeks, sooner if needed with pft's   12/02/2011 f/u ov/[name] cc better on prednisone then some worse off it but overall  Better vs baseline doe. No cough rec Dulera 200 Take 2 puffs first thing in am and then another 2 puffs about 12 hours later and if you like it fill the prescription  Continue spiriva each am only - take this immediately after dulera  Work on [employer] perfect inhaler technique      02/19/2012  post hospital followup. Patient was admitted March 27 of 02/06/2012 for a COPD exacerbation. He was treated with IV antibiotics, nebulized bronchodilators, IV steroids. He was discharged on doxycycline and a steroid taper. Chest x-ray showed COPD changes without any acute process. He was changed off his Spiriva to Atrovent nebulizer. He was restarted on Dulera  2 puffs  twice daily.  Since discharge. Patient feels improved w/ decreased cough and congestion .  He has not smoked x 4 weeks. He has finished all his antibiotics and steroids. He will not be able to use atrovent neb due to work schedule.  rec Restart Spiriva  1 puff daily  Stop Atrovent NEB  For Rescue USE ONLY::Try to rest first then if not improved may go to inhaler or neb  May use Xopenex Inhaler 2 puffs every 4 hrs As needed  Wheezing-/trouble breathing - this is your rescue inhaler. -away from home  May use Albuterol NEB every 4 hr as needed for wheezing , trouble breathing - at home  Continue on Dulera 2 puffs Twice daily     03/01/2012 f/u ov/[name] cc breathing tends worse daily (vs on prednisone) toward end of shift (2pm to 830 pm).  Wakes up each am with rattling producing nothing @  Nl wake up time and not much albuterol use daytime in any form.  Prev aecopd ? Due to change to foradil instead of maintaining dulera and increased cig use which continues  "no more than 1-2 per week" rec Prednisone 10 mg take  4 each am x 2 days,   2 each am x 2 days,  1 each am x2days and stop  Stop smoking completely before smoking completely stops you!  Only use your albuterol (plan B xopenex, Plan C is nebulizer) as a rescue medication o be used up to every 4 hours if needed  Work on perfecting  inhaler technique   04/20/2012 f/u ov/[name] last cigarette 02/2012 overall no change doe and cough, no purulent sputum rec Congratulations on not smoking - it's the most important aspect of your care  For cough > mucinex 600 up to  2 every 12 hours For breathing xopenex hfa(your inhaler) up to 2 puffs every to 4 hours and only use the nebulizer if can't catch your breathing after the inhaler   07/27/2012 f/u ov/[name] still not smoking cc no change doe, using saba twice daily "when over do it it at work" and x few weeks more congested in am but no purulent sputum. rec Prednisone 10 mg take  4 each am x 2 days,   2  each am x 2 days,  1 each am x2days and stop  Congratulations on not smoking - it's the most important aspect of your care  For cough as needed  > mucinex dm 600 up to  2 every 12 hours For breathing as needed  xopenex hfa(your inhaler) up to 2 puffs every to 4 hours and only use the nebulizer if can't catch your breathing after the inhaler   10/24/2012 f/u ov/[name] still not smoking but breathing worse with exertion x indolent onset x one week  p stopping spiriva with increased need for saba mdi and neb but still comfortable at rest. Plan A Dulera and Spiriva are Plan A - take these "no matter what" Only use your albuterol (plan B Xopenox/  Plan C is the nebulizer)  as   rescue medication  Prednisone 10 mg take  4 each am x 2 days,   2 each am x 2 days,  1 each am x2days and stop   01/23/2013 f/u ov/[name] cc chronic congested cough esp in am, esp in cold weather, no change doe and no need for saba or mucinex     No obvious daytime variabilty or assoc chronic cough or cp or chest tightness, subjective wheeze overt sinus or hb symptoms. No unusual exp hx or h/o childhood pna/ asthma or premature birth to his knowledge.   Sleeping ok without nocturnal  or early am exacerbation  of respiratory  c/o's or need for noct saba. Also denies any obvious fluctuation of symptoms with weather or environmental changes or other aggravating or alleviating factors except as outlined above.   ROS  The following are not active complaints unless bolded sore throat, dysphagia, dental problems, itching, sneezing,  nasal congestion or excess/ purulent secretions, ear ache,   fever, chills, sweats, unintended wt loss, pleuritic or exertional cp, hemoptysis,  orthopnea pnd or leg swelling, presyncope, palpitations, heartburn, abdominal pain, anorexia, nausea, vomiting, diarrhea  or change in bowel or urinary habits, change in stools or urine, dysuria,hematuria,  rash, arthralgias, visual complaints, headache, numbness  weakness or ataxia or problems with walking or coordination,  change in mood/affect or memory.                         Objective:       amb wm  NAD, well nourished   Wt 188 01/09/12 > 04/22/12 195 > 07/27/2012  194 > 196 10/24/2012 > 01/23/2013  202     HEENT mild turbinate edema.  Oropharynx no thrush or excess pnd or cobblestoning.  No JVD or cervical  adenopathy. Mild accessory muscle hypertrophy. Trachea midline, nl thryroid. Chest was hyperinflated by percussion with diminished breath sounds and moderate increased exp time without wheeze. Hoover sign positive at mid inspiration. Regular rate and rhythm without murmur gallop or rub or increase P2 or edema.  Abd: no hsm, nl excursion. Ext warm without cyanosis or clubbing.      CXR  10/24/2012 : No active disease. Hyperinflation again noted.    Assessment & Plan:

## 2013-03-04 ENCOUNTER — Other Ambulatory Visit (HOSPITAL_COMMUNITY): Payer: Self-pay | Admitting: Internal Medicine

## 2013-04-25 ENCOUNTER — Encounter: Payer: Self-pay | Admitting: Internal Medicine

## 2013-04-25 ENCOUNTER — Ambulatory Visit (INDEPENDENT_AMBULATORY_CARE_PROVIDER_SITE_OTHER): Payer: BC Managed Care – PPO | Admitting: Internal Medicine

## 2013-04-25 VITALS — BP 146/80 | HR 79 | Temp 97.8°F | Ht 76.0 in | Wt 200.0 lb

## 2013-04-25 DIAGNOSIS — J449 Chronic obstructive pulmonary disease, unspecified: Secondary | ICD-10-CM

## 2013-04-25 MED ORDER — ALBUTEROL SULFATE HFA 108 (90 BASE) MCG/ACT IN AERS: 2.0000 | INHALATION_SPRAY | RESPIRATORY_TRACT | Status: DC | PRN

## 2013-04-25 NOTE — Patient Instructions (Addendum)
Dulera and Spiriva are Plan A = automatic =  take these "no matter what"  Only use your albuterol (plan B Proaire/  Plan C is the nebulizer)  as   rescue medication to be used if you can't catch your breath by resting or doing a relaxed purse lip breathing pattern. The less you use it, the better it will work when you need it. Ok to use up to every 4 hours if needed   For cough and congestion > mucinex up to 1200 mg every 12 hours.  Please schedule a follow up visit in 3 months but call sooner if needed

## 2013-04-25 NOTE — Progress Notes (Signed)
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 Minimally better from albuterol.  Much better resp symptoms p prednisone for cluster ha, def improvement in head congestion, also less cough with prednsione.   cc having more cough at hs > white mucus, freq exac in am's, better p coffeee and gets around to taking am meds only an hour or two p rising. rec Stop foradil Start symbicort 160  Take 2 puffs first thing in am and then another 2 puffs about 12 hours later.  Work on inhaler technique:  .    Prednisone 10 mg take  4 each am x 2 days,   2 each am x 2 days,  1 each am x2days and stop Please schedule a follow up office visit in 4 weeks, sooner if needed with pft's   12/02/2011 f/u ov/Berea [name] cc better on prednisone then some worse off it but overall  Better vs baseline doe. No cough rec Dulera 200 Take 2 puffs first thing in am and then another 2 puffs about 12 hours later and if you like it fill the prescription  Continue spiriva each am only - take this immediately after dulera  Work on [employer] perfect inhaler technique      02/19/2012  post hospital followup. Patient was admitted March 27 of 02/06/2012 for a COPD exacerbation. He was treated with IV antibiotics, nebulized bronchodilators, IV steroids. He was discharged on doxycycline and a steroid taper. Chest x-ray showed COPD changes without any acute process. He was changed off his Spiriva to Atrovent nebulizer. He was restarted on Dulera  2 puffs  twice daily.  Since discharge. Patient feels improved w/ decreased cough and congestion .  He has not smoked x 4 weeks. He has finished all his antibiotics and steroids. He will not be able to use atrovent neb due to work schedule.  rec Restart Spiriva  1 puff daily  Stop Atrovent NEB  For Rescue USE ONLY::Try to rest first then if not improved may go to inhaler or neb  May use Xopenex Inhaler 2 puffs every 4 hrs As needed  Wheezing-/trouble breathing - this is your rescue inhaler. -away from home  May use Albuterol NEB every 4 hr as needed for wheezing , trouble breathing - at home  Continue on Dulera 2 puffs Twice daily     03/01/2012 f/u ov/Theodore [name] cc breathing tends worse daily (vs on prednisone) toward end of shift (2pm to 830 pm).  Wakes up each am with rattling producing nothing @  Nl wake up time and not much albuterol use daytime in any form.  Prev aecopd ? Due to change to foradil instead of maintaining dulera and increased cig use which continues  "no more than 1-2 per week" rec Prednisone 10 mg take  4 each am x 2 days,   2 each am x 2 days,  1 each am x2days and stop  Stop smoking completely before smoking completely stops you!  Only use your albuterol (plan B xopenex, Plan C is nebulizer) as a rescue medication o be used up to every 4 hours if needed  Work on perfecting  inhaler technique   04/20/2012 f/u ov/Theodore [name] last cigarette 02/2012 overall no change doe and cough, no purulent sputum rec Congratulations on not smoking - it's the most important aspect of your care  For cough > mucinex 600 up to  2 every 12 hours For breathing xopenex hfa(your inhaler) up to 2 puffs every to 4 hours and only use the nebulizer if can't catch your breathing after the inhaler   07/27/2012 f/u ov/Theodore [name] still not smoking cc no change doe, using saba twice daily "when over do it it at work" and x few weeks more congested in am but no purulent sputum. rec Prednisone 10 mg take  4 each am x 2 days,   2  each am x 2 days,  1 each am x2days and stop  Congratulations on not smoking - it's the most important aspect of your care  For cough as needed  > mucinex dm 600 up to  2 every 12 hours For breathing as needed  xopenex hfa(your inhaler) up to 2 puffs every to 4 hours and only use the nebulizer if can't catch your breathing after the inhaler   10/24/2012 f/u ov/Theodore [name] still not smoking but breathing worse with exertion x indolent onset x one week  p stopping spiriva with increased need for saba mdi and neb but still comfortable at rest. Plan A Dulera and Spiriva are Plan A - take these "no matter what" Only use your albuterol (plan B Xopenox/  Plan C is the nebulizer)  as   rescue medication  Prednisone 10 mg take  4 each am x 2 days,   2 each am x 2 days,  1 each am x2days and stop   01/23/2013 f/u ov/Theodore [name] cc chronic congested cough esp in am, esp in cold weather, no change doe and no need for saba or mucinex  rec  Dulera and Spiriva are Plan A = automatic =  take these "no matter what" Only use your albuterol (plan B Xopenox/  Plan C is the nebulizer)  as   rescue medication to be used if you can't catch your breath by resting or doing a relaxed purse lip breathing pattern. The less you use it, the better it will work when you need it.  For cough and congestion > mucinex up to 1200 mg every 12 hours.  04/25/2013 f/u ov/Theodore [name] re copd Chief Complaint  Patient presents with  . Follow-up    Breathing some worse with hot/humid weather. No other new co's today.    Mowed front yard self propelled without stopping and then had to stop every few minutes  on back yard but still  rarely using saba   Other than heat related no sign day to day variabilty or assoc chronic cough or cp or chest tightness, subjective wheeze overt sinus or hb symptoms. No unusual exp hx or h/o childhood pna/ asthma or premature birth to his knowledge.   Sleeping ok without nocturnal  or early am exacerbation  of respiratory   c/o's or need for noct saba. Also denies any obvious fluctuation of symptoms with weather or environmental changes or other aggravating or alleviating factors except as outlined above.  Current Medications, Allergies, Past Medical History, Past Surgical History, Family History, and Social History were reviewed in [employer] record.  ROS  The following are not active complaints unless bolded sore throat, dysphagia, dental problems, itching, sneezing,  nasal congestion or excess/ purulent secretions, ear ache,   fever, chills, sweats, unintended wt loss, pleuritic or exertional cp, hemoptysis,  orthopnea pnd or leg swelling, presyncope, palpitations, heartburn, abdominal pain, anorexia, nausea, vomiting, diarrhea  or change in bowel or urinary habits, change in stools or urine, dysuria,hematuria,  rash, arthralgias, visual complaints, headache, numbness weakness or ataxia or problems with walking or coordination,  change in mood/affect or memory.                             Objective:       amb wm  NAD, well nourished   Wt 188 01/09/12 >  07/27/2012  194 > 196 10/24/2012 > 01/23/2013  202> 200 04/25/2013      HEENT mild turbinate edema.  Oropharynx no thrush or excess pnd or cobblestoning.  No JVD or cervical adenopathy. Mild accessory muscle hypertrophy. Trachea midline, nl thryroid. Chest was hyperinflated by percussion with diminished breath sounds and moderate increased exp time without wheeze. Hoover sign positive at mid inspiration. Regular rate and rhythm without murmur gallop or rub or increase P2 or edema.  Abd: no hsm, nl excursion. Ext warm without cyanosis or clubbing.      CXR  10/24/2012 : No active disease. Hyperinflation again noted.    Assessment & Plan:

## 2013-04-26 NOTE — Assessment & Plan Note (Signed)
-   HFA 75% p coaching 03/01/12 > 90%01/23/2013    - PFT's 12/02/2011  FEV1  0.94 (24%) ratio 39 -  18% response to B 2 and DLCO 60%    -Alpha 1 genotype sent 03/01/12 >  MM   Adequate control on present rx >   Each maintenance medication was reviewed in detail including most importantly the difference between maintenance and as needed and under what circumstances the prns are to be used.  Please see instructions for details which were reviewed in writing and the patient given a copy.

## 2013-05-09 ENCOUNTER — Other Ambulatory Visit: Payer: Self-pay | Admitting: Internal Medicine

## 2013-06-13 ENCOUNTER — Ambulatory Visit (INDEPENDENT_AMBULATORY_CARE_PROVIDER_SITE_OTHER)
Admission: RE | Admit: 2013-06-13 | Discharge: 2013-06-13 | Disposition: A | Discharge status: Home / Self Care | Payer: BC Managed Care – PPO | Source: Ambulatory Visit | Attending: Internal Medicine | Admitting: Internal Medicine

## 2013-06-13 ENCOUNTER — Ambulatory Visit (INDEPENDENT_AMBULATORY_CARE_PROVIDER_SITE_OTHER): Payer: BC Managed Care – PPO | Admitting: Internal Medicine

## 2013-06-13 ENCOUNTER — Encounter: Payer: Self-pay | Admitting: Internal Medicine

## 2013-06-13 VITALS — BP 126/80 | HR 85 | Temp 99.5°F | Ht 76.0 in | Wt 194.4 lb

## 2013-06-13 DIAGNOSIS — J441 Chronic obstructive pulmonary disease with (acute) exacerbation: Secondary | ICD-10-CM

## 2013-06-13 DIAGNOSIS — J449 Chronic obstructive pulmonary disease, unspecified: Secondary | ICD-10-CM

## 2013-06-13 MED ORDER — PREDNISONE (PAK) 10 MG PO TABS: ORAL_TABLET | ORAL | Status: DC

## 2013-06-13 MED ORDER — AZITHROMYCIN 250 MG PO TABS: ORAL_TABLET | ORAL | Status: DC

## 2013-06-13 NOTE — Patient Instructions (Addendum)
z- pak Prednisone 10 mg take  4 each am x 2 days,   2 each am x 2 days,  1 each am x 2 days and stop   Please remember to go to the x-ray department downstairs for your tests - we will call you with the results when they are available.  Please schedule a follow up office visit in 6 weeks, call sooner if needed

## 2013-06-13 NOTE — Progress Notes (Signed)
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 Minimally better from albuterol.  Much better resp symptoms p prednisone for cluster ha, def improvement in head congestion, also less cough with prednsione.   cc having more cough at hs > white mucus, freq exac in am's, better p coffeee and gets around to taking am meds only an hour or two p rising. rec Stop foradil Start symbicort 160  Take 2 puffs first thing in am and then another 2 puffs about 12 hours later.  Work on inhaler technique:  .    Prednisone 10 mg take  4 each am x 2 days,   2 each am x 2 days,  1 each am x2days and stop Please schedule a follow up office visit in 4 weeks, sooner if needed with pft's   12/02/2011 f/u ov/Theodore [name] cc better on prednisone then some worse off it but overall  Better vs baseline doe. No cough rec Dulera 200 Take 2 puffs first thing in am and then another 2 puffs about 12 hours later and if you like it fill the prescription  Continue spiriva each am only - take this immediately after dulera  Work on [employer] perfect inhaler technique      02/19/2012  post hospital followup. Patient was admitted March 27 of 02/06/2012 for a COPD exacerbation. He was treated with IV antibiotics, nebulized bronchodilators, IV steroids. He was discharged on doxycycline and a steroid taper. Chest x-ray showed COPD changes without any acute process. He was changed off his Spiriva to Atrovent nebulizer. He was restarted on Dulera  2 puffs twice daily.  Since  discharge. Patient feels improved w/ decreased cough and congestion .  He has not smoked x 4 weeks. He has finished all his antibiotics and steroids. He will not be able to use atrovent neb due to work schedule.  rec Restart Spiriva  1 puff daily  Stop Atrovent NEB  For Rescue USE ONLY::Try to rest first then if not improved may go to inhaler or neb  May use Xopenex Inhaler 2 puffs every 4 hrs As needed  Wheezing-/trouble breathing - this is your rescue inhaler. -away from home  May use Albuterol NEB every 4 hr as needed for wheezing , trouble breathing - at home  Continue on Dulera 2 puffs Twice daily     03/01/2012 f/u ov/Theodore [name] cc breathing tends worse daily (vs on prednisone) toward end of shift (2pm to 830 pm).  Wakes up each am with rattling producing nothing @  Nl wake up time and not much albuterol use daytime in any form.  Prev aecopd ? Due to change to foradil instead of maintaining dulera and increased cig use which continues  "no more than 1-2 per week" rec Prednisone 10 mg take  4 each am x 2 days,   2 each am x 2 days,  1 each am x2days and stop  Stop smoking completely before smoking completely stops you! Only use your  albuterol (plan B xopenex, Plan C is nebulizer) as a rescue medication o be used up to every 4 hours if needed  Work on perfecting  inhaler technique   04/20/2012 f/u ov/Theodore [name] last cigarette 02/2012 overall no change doe and cough, no purulent sputum rec Congratulations on not smoking - it's the most important aspect of your care  For cough > mucinex 600 up to  2 every 12 hours For breathing xopenex hfa(your inhaler) up to 2 puffs every to 4 hours and only use the nebulizer if can't catch your breathing after the inhaler   04/25/2013 f/u ov/Theodore [name] re copd Chief Complaint  Patient presents with  . Follow-up    Breathing some worse with hot/humid weather. No other new co's today.    Mowed front yard self propelled without stopping and then had to stop every few  minutes  on back yard but still  rarely using saba  rec Dulera and Spiriva are Plan A = automatic =  take these "no matter what" Only use your albuterol (plan B Proaire/  Plan C is the nebulizer)  as   rescue medication to be used if you can't catch your breath by resting or doing a relaxed purse lip breathing pattern. The less you use it, the better it will work when you need it. Ok to use up to every 4 hours if needed  For cough and congestion > mucinex up to 1200 mg every 12 hours.  06/13/2013 acute  ov/Theodore [name] re ? Theodore [name] Chief Complaint  Patient presents with  . Acute Visit    increased SOB, rattling in chest, cough - prod in the early mornings.  Fever up to  100.8 in the evenings.    woke up 8/3  Feeling bad with purulent sputm then febrile pm 8/3 and 8/4 no rigors, min increase in saba use just as hfa, not neb.  Other than heat related no sign day to day variabilty or cp or chest tightness, subjective wheeze overt sinus or hb symptoms. No unusual exp hx or h/o childhood pna/ asthma or premature birth to his knowledge.   Sleeping ok without nocturnal  or early am exacerbation  of respiratory  c/o's or need for noct saba. Also denies any obvious fluctuation of symptoms with weather or environmental changes or other aggravating or alleviating factors except as outlined above.  Current Medications, Allergies, Past Medical History, Past Surgical History, Family History, and Social History were reviewed in [employer] record.  ROS  The following are not active complaints unless bolded sore throat, dysphagia, dental problems, itching, sneezing,  nasal congestion or excess/ purulent secretions, ear ache,   fever, chills, sweats, unintended wt loss, pleuritic or exertional cp, hemoptysis,  orthopnea pnd or leg swelling, presyncope, palpitations, heartburn, abdominal pain, anorexia, nausea, vomiting, diarrhea  or change in bowel or urinary habits, change in stools or urine,  dysuria,hematuria,  rash, arthralgias, visual complaints, headache, numbness weakness or ataxia or problems with walking or coordination,  change in mood/affect or memory.                             Objective:       amb wm  NAD, well nourished   Wt 188 01/09/12 >  07/27/2012  194 > 196 10/24/2012 > 01/23/2013  202> 200 04/25/2013 > 194 06/13/2013      HEENT mild turbinate edema.  Oropharynx no thrush or excess pnd or cobblestoning.  No JVD or cervical adenopathy. Mild accessory muscle hypertrophy. Trachea midline, nl thryroid. Chest was hyperinflated by percussion with diminished breath sounds and moderate increased exp time without wheeze. Hoover sign positive at mid inspiration. Regular rate and rhythm without murmur gallop or rub or increase P2 or edema.  Abd: no hsm, nl excursion. Ext warm without cyanosis or clubbing.     CXR  06/13/2013 :  Hyperinflation again noted. There is left base posteriorly patchy airspace disease best seen on lateral view. This is suspicious for infiltrate/pneumonia. Follow-up to resolution is recommended. No pulmonary edema.       Assessment & Plan:

## 2013-06-13 NOTE — Assessment & Plan Note (Signed)
Possible early cap > rx zpak with low threshold to change to levaquin 750 x5 days

## 2013-06-13 NOTE — Assessment & Plan Note (Signed)
-   HFA 75% p coaching 03/01/12 > 90%01/23/2013    - PFT's 12/02/2011  FEV1  0.94 (24%) ratio 39 -  18% response to B 2 and DLCO 60%    -Alpha 1 genotype sent 03/01/12 >  MM   Adequate control on present rx, reviewed > no change in rx needed

## 2013-06-15 NOTE — Progress Notes (Signed)
Quick Note:  Spoke with pt and notified of results per Dr. Wert. Pt verbalized understanding and denied any questions.  ______ 

## 2013-06-22 ENCOUNTER — Telehealth: Payer: Self-pay | Admitting: Internal Medicine

## 2013-06-22 NOTE — Telephone Encounter (Signed)
Pt seen 06/13/13 with MW Pt states that he feels that he is not feeling any better Pt c/o still having increased congestion, cough, fatigue, wheezing and sob. Pt states that last night he woke up about 230am coughing up congestion in lungs.  Denies fever. However, pt reports waking up very sweaty this morning at 230am.  Allergies  Allergen Reactions  . Naproxen Sodium Itching   [facility] [address]  Please advise Dr Sherene Sires. Thanks.

## 2013-06-22 NOTE — Telephone Encounter (Signed)
Pt is scheduled w/TP tomorrow @12p  ACUTE OFFICE VISIT. ( not a med calendar visit) Pt aware to bring all meds with him so that TP can review.   Will forward to JJ as FYI that TP aware.

## 2013-06-22 NOTE — Telephone Encounter (Signed)
Ov with all meds in hand including any inhaler by weekend either with   [name] - ok to double book for me if Tammy can' see

## 2013-06-23 ENCOUNTER — Encounter: Payer: Self-pay | Admitting: Adult Health

## 2013-06-23 ENCOUNTER — Ambulatory Visit (INDEPENDENT_AMBULATORY_CARE_PROVIDER_SITE_OTHER): Payer: BC Managed Care – PPO | Admitting: Adult Health

## 2013-06-23 ENCOUNTER — Ambulatory Visit (INDEPENDENT_AMBULATORY_CARE_PROVIDER_SITE_OTHER)
Admission: RE | Admit: 2013-06-23 | Discharge: 2013-06-23 | Disposition: A | Discharge status: Home / Self Care | Payer: BC Managed Care – PPO | Source: Ambulatory Visit | Attending: Adult Health | Admitting: Adult Health

## 2013-06-23 VITALS — BP 116/68 | HR 97 | Temp 99.1°F | Ht 76.0 in | Wt 194.2 lb

## 2013-06-23 DIAGNOSIS — J441 Chronic obstructive pulmonary disease with (acute) exacerbation: Secondary | ICD-10-CM

## 2013-06-23 DIAGNOSIS — J189 Pneumonia, unspecified organism: Secondary | ICD-10-CM

## 2013-06-23 MED ORDER — LEVOFLOXACIN 750 MG PO TABS: 750.0000 mg | ORAL_TABLET | Freq: Every day | ORAL | Status: AC

## 2013-06-23 MED ORDER — LEVALBUTEROL HCL 0.63 MG/3ML IN NEBU
0.6300 mg | INHALATION_SOLUTION | Freq: Once | RESPIRATORY_TRACT | Status: AC
  Administered 2013-06-23: 0.63 mg via RESPIRATORY_TRACT

## 2013-06-23 MED ORDER — PREDNISONE 10 MG PO TABS: ORAL_TABLET | ORAL | Status: DC

## 2013-06-23 NOTE — Patient Instructions (Addendum)
Levaquin 750mg  daily for 7 days Mucinex DM Twice daily  As needed  Cough/congestion  Prednisone taper over next week.  follow up in 1 week with Dr. Sherene Sires  With chest xray  Please contact office for sooner follow up if symptoms do not improve or worsen or seek emergency care

## 2013-06-23 NOTE — Progress Notes (Signed)
Quick Note:  ATC pt; line rang >10x with no answer and no option to LM. WCB. ______

## 2013-06-23 NOTE — Progress Notes (Signed)
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 Minimally better from albuterol.  Much better resp symptoms p prednisone for cluster ha, def improvement in head congestion, also less cough with prednsione.   cc having more cough at hs > white mucus, freq exac in am's, better p coffeee and gets around to taking am meds only an hour or two p rising. rec Stop foradil Start symbicort 160  Take 2 puffs first thing in am and then another 2 puffs about 12 hours later.  Work on inhaler technique:  .    Prednisone 10 mg take  4 each am x 2 days,   2 each am x 2 days,  1 each am x2days and stop Please schedule a follow up office visit in 4 weeks, sooner if needed with pft's   12/02/2011 f/u ov/Wert cc better on prednisone then some worse off it but overall  Better vs baseline doe. No cough rec Dulera 200 Take 2 puffs first thing in am and then another 2 puffs about 12 hours later and if you like it fill the prescription  Continue spiriva each am only - take this immediately after dulera  Work on [employer] perfect inhaler technique      02/19/2012  post hospital followup. Patient was admitted March 27 of 02/06/2012 for a COPD exacerbation. He was treated with IV antibiotics, nebulized bronchodilators, IV steroids. He was discharged on doxycycline and a steroid taper. Chest x-ray showed COPD changes without any acute process. He was changed off his Spiriva to Atrovent nebulizer. He was restarted on Dulera  2 puffs twice daily.  Since  discharge. Patient feels improved w/ decreased cough and congestion .  He has not smoked x 4 weeks. He has finished all his antibiotics and steroids. He will not be able to use atrovent neb due to work schedule.  rec Restart Spiriva  1 puff daily  Stop Atrovent NEB  For Rescue USE ONLY::Try to rest first then if not improved may go to inhaler or neb  May use Xopenex Inhaler 2 puffs every 4 hrs As needed  Wheezing-/trouble breathing - this is your rescue inhaler. -away from home  May use Albuterol NEB every 4 hr as needed for wheezing , trouble breathing - at home  Continue on Dulera 2 puffs Twice daily     03/01/2012 f/u ov/Wert cc breathing tends worse daily (vs on prednisone) toward end of shift (2pm to 830 pm).  Wakes up each am with rattling producing nothing @  Nl wake up time and not much albuterol use daytime in any form.  Prev aecopd ? Due to change to foradil instead of maintaining dulera and increased cig use which continues  "no more than 1-2 per week" rec Prednisone 10 mg take  4 each am x 2 days,   2 each am x 2 days,  1 each am x2days and stop  Stop smoking completely before smoking completely stops you! Only use your  albuterol (plan B xopenex, Plan C is nebulizer) as a rescue medication o be used up to every 4 hours if needed  Work on perfecting  inhaler technique   04/20/2012 f/u ov/Wert last cigarette 02/2012 overall no change doe and cough, no purulent sputum rec Congratulations on not smoking - it's the most important aspect of your care  For cough > mucinex 600 up to  2 every 12 hours For breathing xopenex hfa(your inhaler) up to 2 puffs every to 4 hours and only use the nebulizer if can't catch your breathing after the inhaler   04/25/2013 f/u ov/Wert re copd Chief Complaint  Patient presents with  . Follow-up    Breathing some worse with hot/humid weather. No other new co's today.    Mowed front yard self propelled without stopping and then had to stop every few  minutes  on back yard but still  rarely using saba  rec Dulera and Spiriva are Plan A = automatic =  take these "no matter what" Only use your albuterol (plan B Proaire/  Plan C is the nebulizer)  as   rescue medication to be used if you can't catch your breath by resting or doing a relaxed purse lip breathing pattern. The less you use it, the better it will work when you need it. Ok to use up to every 4 hours if needed  For cough and congestion > mucinex up to 1200 mg every 12 hours.  06/13/2013 acute  ov/Wert re ? [name] Chief Complaint  Patient presents with  . Acute Visit    increased SOB, rattling in chest, cough - prod in the early mornings.  Fever up to  100.8 in the evenings.    woke up 8/3  Feeling bad with purulent sputm then febrile pm 8/3 and 8/4 no rigors, min increase in saba use just as hfa, not neb. >zpack   06/23/13 Acute OV  pt reports breathing had improved until early yesterday morning when he woke up with SOB, prod cough.  still having some DOE, prod cough with white foamy mucus, chest tightness, wheezing. Pt seen 10 days ago with COPD flare tx w/ zpack . CXR came back with LLL infiltrate. He says he felt  better up until  Yesterday. Now has cough with thick congestion . Appetite is fair, no n/v/d  No hemoptysis or fever.  CXR today shows bilateral LL opacities L>R . No orthopnea or edema .   06/23/2013 Acute OV  Current Medications, Allergies, Past Medical History, Past Surgical History, Family History, and Social History were reviewed in [employer] record.  ROS  The following are not active complaints unless bolded sore throat, dysphagia, dental problems, itching, sneezing,  , ear ache,   sweats, unintended wt loss, pleuritic or exertional cp, hemoptysis,  orthopnea pnd or leg swelling, presyncope, palpitations, heartburn, abdominal pain, anorexia, nausea, vomiting, diarrhea  or change in bowel or urinary habits, change in stools or urine,  dysuria,hematuria,  rash, arthralgias, visual complaints, headache, numbness weakness or ataxia or problems with walking or coordination,  change in mood/affect or memory.                             Objective:       amb wm  NAD, well nourished   Wt 188 01/09/12 >  07/27/2012  194 > 196 10/24/2012 > 01/23/2013  202> 200 04/25/2013 > 194 06/13/2013 >164 815  HEENT mild turbinate edema.  Oropharynx no thrush or excess pnd or cobblestoning.  No JVD or cervical adenopathy. Mild accessory muscle hypertrophy. Trachea midline, nl thryroid. Chest was hyperinflated by percussion with diminished breath sounds and moderate increased exp time without wheeze. Hoover sign positive at mid inspiration. Regular rate and rhythm without murmur gallop or rub or increase P2 or edema.  Abd: no hsm, nl excursion. Ext warm without cyanosis or clubbing.     CXR  06/13/2013 :  Hyperinflation again noted. There is left base posteriorly patchy airspace disease best seen on lateral view. This is suspicious for infiltrate/pneumonia. Follow-up to resolution is recommended. No pulmonary edema.       Assessment & Plan:

## 2013-06-25 DIAGNOSIS — J189 Pneumonia, unspecified organism: Secondary | ICD-10-CM | POA: Insufficient documentation

## 2013-06-25 NOTE — Assessment & Plan Note (Signed)
Clinical failure with zpak Pt will begin Levaquin, advised if not improving or worsens will require hospitalization  Close follow up   Plan  Levaquin 750mg  daily for 7 days Mucinex DM Twice daily  As needed  Cough/congestion  Prednisone taper over next week.  follow up in 1 week with Dr. Sherene Sires  With chest xray  Please contact office for sooner follow up if symptoms do not improve or worsen or seek emergency care

## 2013-06-25 NOTE — Assessment & Plan Note (Signed)
Flare with developing PNA- R>L  Zpack failure   Plan  Levaquin 750mg  daily for 7 days Mucinex DM Twice daily  As needed  Cough/congestion  Prednisone taper over next week.  follow up in 1 week with Dr. Sherene Sires  With chest xray  Please contact office for sooner follow up if symptoms do not improve or worsen or seek emergency care

## 2013-06-26 ENCOUNTER — Other Ambulatory Visit: Payer: Self-pay | Admitting: Adult Health

## 2013-06-26 ENCOUNTER — Telehealth: Payer: Self-pay | Admitting: Internal Medicine

## 2013-06-26 MED ORDER — ALBUTEROL SULFATE (2.5 MG/3ML) 0.083% IN NEBU: 2.5000 mg | INHALATION_SOLUTION | Freq: Four times a day (QID) | RESPIRATORY_TRACT | Status: DC | PRN

## 2013-06-26 NOTE — Telephone Encounter (Signed)
Pt is asking for a refill on albuterol neb. It was on pt med list but looks like it had an end date so Epic deleted it. I have sent rx to pt pharmacy. Pt is aware. Carron Curie, CMA

## 2013-06-26 NOTE — Telephone Encounter (Signed)
ATC pt received fast busy signal x 3 wcb

## 2013-06-28 ENCOUNTER — Encounter: Payer: Self-pay | Admitting: Internal Medicine

## 2013-06-28 ENCOUNTER — Ambulatory Visit (INDEPENDENT_AMBULATORY_CARE_PROVIDER_SITE_OTHER): Payer: BC Managed Care – PPO | Admitting: Internal Medicine

## 2013-06-28 ENCOUNTER — Ambulatory Visit (INDEPENDENT_AMBULATORY_CARE_PROVIDER_SITE_OTHER)
Admission: RE | Admit: 2013-06-28 | Discharge: 2013-06-28 | Disposition: A | Discharge status: Home / Self Care | Payer: BC Managed Care – PPO | Source: Ambulatory Visit | Attending: Internal Medicine | Admitting: Internal Medicine

## 2013-06-28 VITALS — BP 112/78 | HR 103 | Temp 98.2°F | Ht 76.0 in | Wt 191.0 lb

## 2013-06-28 DIAGNOSIS — J189 Pneumonia, unspecified organism: Secondary | ICD-10-CM

## 2013-06-28 DIAGNOSIS — J449 Chronic obstructive pulmonary disease, unspecified: Secondary | ICD-10-CM

## 2013-06-28 MED ORDER — ACAPELLA MISC: Status: DC

## 2013-06-28 MED ORDER — PREDNISONE 10 MG PO TABS: 10.0000 mg | ORAL_TABLET | Freq: Every day | ORAL | Status: DC

## 2013-06-28 NOTE — Progress Notes (Signed)
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 Minimally better from albuterol.  Much better resp symptoms p prednisone for cluster ha, def improvement in head congestion, also less cough with prednsione.   cc having more cough at hs > white mucus, freq exac in am's, better p coffeee and gets around to taking am meds only an hour or two p rising. rec Stop foradil Start symbicort 160  Take 2 puffs first thing in am and then another 2 puffs about 12 hours later.  Work on inhaler technique:  .    Prednisone 10 mg take  4 each am x 2 days,   2 each am x 2 days,  1 each am x2days and stop Please schedule a follow up office visit in 4 weeks, sooner if needed with pft's   12/02/2011 f/u ov/Theodore [name] cc better on prednisone then some worse off it but overall  Better vs baseline doe. No cough rec Dulera 200 Take 2 puffs first thing in am and then another 2 puffs about 12 hours later and if you like it fill the prescription  Continue spiriva each am only - take this immediately after dulera  Work on [employer] perfect inhaler technique      02/19/2012  post hospital followup. Patient was admitted March 27 of 02/06/2012 for a COPD exacerbation. He was treated with IV antibiotics, nebulized bronchodilators, IV steroids. He was discharged on doxycycline and a steroid taper. Chest x-ray showed COPD changes without any acute process. He was changed off his Spiriva to Atrovent nebulizer. He was restarted on Dulera  2 puffs twice daily.  Since  discharge. Patient feels improved w/ decreased cough and congestion .  He has not smoked x 4 weeks. He has finished all his antibiotics and steroids. He will not be able to use atrovent neb due to work schedule.  rec Restart Spiriva  1 puff daily  Stop Atrovent NEB  For Rescue USE ONLY::Try to rest first then if not improved may go to inhaler or neb  May use Xopenex Inhaler 2 puffs every 4 hrs As needed  Wheezing-/trouble breathing - this is your rescue inhaler. -away from home  May use Albuterol NEB every 4 hr as needed for wheezing , trouble breathing - at home  Continue on Dulera 2 puffs Twice daily     03/01/2012 f/u ov/Theodore [name] cc breathing tends worse daily (vs on prednisone) toward end of shift (2pm to 830 pm).  Wakes up each am with rattling producing nothing @  Nl wake up time and not much albuterol use daytime in any form.  Prev aecopd ? Due to change to foradil instead of maintaining dulera and increased cig use which continues  "no more than 1-2 per week" rec Prednisone 10 mg take  4 each am x 2 days,   2 each am x 2 days,  1 each am x2days and stop  Stop smoking completely before smoking completely stops you! Only use your  albuterol (plan B xopenex, Plan C is nebulizer) as a rescue medication o be used up to every 4 hours if needed  Work on perfecting  inhaler technique   04/20/2012 f/u ov/Theodore [name] last cigarette 02/2012 overall no change doe and cough, no purulent sputum rec Congratulations on not smoking - it's the most important aspect of your care  For cough > mucinex 600 up to  2 every 12 hours For breathing xopenex hfa(your inhaler) up to 2 puffs every to 4 hours and only use the nebulizer if can't catch your breathing after the inhaler   04/25/2013 f/u ov/Theodore [name] re copd Chief Complaint  Patient presents with  . Follow-up    Breathing some worse with hot/humid weather. No other new co's today.    Mowed front yard self propelled without stopping and then had to stop every few  minutes  on back yard but still  rarely using saba  rec Dulera and Spiriva are Plan A = automatic =  take these "no matter what" Only use your albuterol (plan B Proaire/  Plan C is the nebulizer)  as   rescue medication to be used if you can't catch your breath by resting or doing a relaxed purse lip breathing pattern. The less you use it, the better it will work when you need it. Ok to use up to every 4 hours if needed  For cough and congestion > mucinex up to 1200 mg every 12 hours.  06/13/2013 acute  ov/Theodore [name] re ? Theodore [name] Chief Complaint  Patient presents with  . Acute Visit    increased SOB, rattling in chest, cough - prod in the early mornings.  Fever up to  100.8 in the evenings.    woke up 8/3  Feeling bad with purulent sputm then febrile pm 8/3 and 8/4 no rigors, min increase in saba use just as hfa, not neb. >zpack / pred   06/23/13 Acute OV  pt reports breathing had improved until 8/14  he woke up with SOB, prod cough.  still having some DOE, prod cough with white foamy mucus, chest tightness, wheezing.  CXR> bilateral LL opacities L>R  Levaquin 750mg  daily for 7 days Mucinex DM Twice daily  As needed  Cough/congestion  Prednisone taper over next week  06/28/2013 f/u ov/Theodore [name] f/u ? pna Chief Complaint  Patient presents with  . 1 Week ROV    Reports severe coughing, chest tightness, clear mucus production. Had CXR this morning.  fever gone, cough still severe > clear mucus better than it was Breathing better 8/18 then worse since with increases need fo rsaba but no rsting sob   No obvious daytime variabilty or   cp or chest tightness, subjective wheeze overt sinus or hb symptoms. No unusual exp hx or h/o childhood pna/ asthma or knowledge of premature birth.   Sleeping ok without nocturnal  or early am exacerbation  of respiratory  c/o's or need for noct saba. Also denies any obvious fluctuation of symptoms with weather or environmental changes or other aggravating or alleviating  factors except as outlined above   .      Current Medications, Allergies, Past Medical History, Past Surgical History, Family History, and Social History were reviewed in [employer] record.  ROS  The following are not active complaints unless bolded sore throat, dysphagia, dental problems, itching, sneezing,  , ear ache,   sweats, unintended wt loss, pleuritic or exertional cp, hemoptysis,  orthopnea pnd or leg swelling, presyncope, palpitations, heartburn,  abdominal pain, anorexia, nausea, vomiting, diarrhea  or change in bowel or urinary habits, change in stools or urine, dysuria,hematuria,  rash, arthralgias, visual complaints, headache, numbness weakness or ataxia or problems with walking or coordination,  change in mood/affect or memory.                             Objective:       amb wm  NAD, well nourished   Wt 188 01/09/12 >  07/27/2012  194 > 196 10/24/2012 > 01/23/2013  202> 200 04/25/2013 > 194 06/13/2013 >164 8/15 > 06/28/2013  191      HEENT mild turbinate edema.  Oropharynx no thrush or excess pnd or cobblestoning.  No JVD or cervical adenopathy. Mild accessory muscle hypertrophy. Trachea midline, nl thryroid. Chest was hyperinflated by percussion with diminished breath sounds and moderate increased exp time without wheeze. Hoover sign positive at mid inspiration. Regular rate and rhythm without murmur gallop or rub or increase P2 or edema.  Abd: no hsm, nl excursion. Ext warm without cyanosis or clubbing.     CXR  06/28/2013 :  Although there has been partial clearing of the bilateral lower lobe airspace opacities compared with the most recent prior study, there is a new focal left lower lobe density. As this was not present 2 weeks ago, this still likely represents focal pneumonia. Continued radiographic followup is recommended to document clearing.       Assessment & Plan:

## 2013-06-28 NOTE — Patient Instructions (Addendum)
Plan A = dulera and spiriva and pepcid ac 20 mg at bedtime   Plan B= back up = proaire up to 2 puffs every 4 hours  Plan C= neb up to every 4 hours as long as you tried Plan B first  For cough and congestion > mucinex up to 1200 mg every 12 hours and use flutter valve as much as possible   Prednisone Take 4 for two days three for two days two for two days one for two days   See [name] w/in 2 weeks with all your medications, even over the counter meds, separated in two separate bags, the ones you take no matter what vs the ones you stop once you feel better and take only as needed when you feel you need them.   Tammy  will generate for you a new user friendly medication calendar that will put [place] all on the same page re: your medication use.     Without this process, it simply isn't possible to assure that we are providing  your outpatient care  with  the attention to detail we feel you deserve.   If we cannot assure that you're getting that kind of care,  then we cannot manage your problem effectively from this clinic.  Once you have seen Tammy and we are sure that we're all on the same page with your medication use she will arrange follow up with me.  Late add f/u cxr on return

## 2013-06-29 NOTE — Assessment & Plan Note (Signed)
Clearly better no longer with symptoms or signs of infection > rx inflammatory component and f/u cxr

## 2013-06-29 NOTE — Assessment & Plan Note (Signed)
-   HFA 75% p coaching 03/01/12 > 90% 01/23/2013    - PFT's 12/02/2011  FEV1  0.94 (24%) ratio 39 -  18% response to B 2 and DLCO 60%    -Alpha 1 genotype sent 03/01/12 >  MM     Each maintenance medication was reviewed in detail including most importantly the difference between maintenance and as needed and under what circumstances the prns are to be used.  Please see instructions for details which were reviewed in writing and the patient given a copy.

## 2013-06-30 ENCOUNTER — Ambulatory Visit: Payer: BC Managed Care – PPO | Admitting: Internal Medicine

## 2013-07-06 NOTE — Telephone Encounter (Signed)
Rx denied - refills just sent at 8.18.14 ov w/ MW

## 2013-07-12 ENCOUNTER — Encounter: Payer: BC Managed Care – PPO | Admitting: Adult Health

## 2013-07-14 ENCOUNTER — Encounter: Payer: Self-pay | Admitting: Adult Health

## 2013-07-14 ENCOUNTER — Ambulatory Visit (INDEPENDENT_AMBULATORY_CARE_PROVIDER_SITE_OTHER): Payer: BC Managed Care – PPO | Admitting: Adult Health

## 2013-07-14 ENCOUNTER — Ambulatory Visit (INDEPENDENT_AMBULATORY_CARE_PROVIDER_SITE_OTHER)
Admission: RE | Admit: 2013-07-14 | Discharge: 2013-07-14 | Disposition: A | Discharge status: Home / Self Care | Payer: BC Managed Care – PPO | Source: Ambulatory Visit | Attending: Adult Health | Admitting: Adult Health

## 2013-07-14 VITALS — BP 122/84 | HR 83 | Temp 98.1°F | Ht 76.0 in | Wt 199.4 lb

## 2013-07-14 DIAGNOSIS — Z23 Encounter for immunization: Secondary | ICD-10-CM

## 2013-07-14 DIAGNOSIS — J449 Chronic obstructive pulmonary disease, unspecified: Secondary | ICD-10-CM

## 2013-07-14 DIAGNOSIS — J189 Pneumonia, unspecified organism: Secondary | ICD-10-CM

## 2013-07-14 NOTE — Patient Instructions (Addendum)
Follow med calendar closely and bring to each visit.  NO MINT products .  Follow up Dr. Sherene Sires  In 6 weeks with chest xray .  Flu shot today .

## 2013-07-14 NOTE — Assessment & Plan Note (Signed)
Clinically improved  CXR today shows COPD. Improving but persistent density in the left lung base. Recommend continued followup.  Plan  follow up in 6 weeks with Dr. Sherene Sires  With chest xray

## 2013-07-14 NOTE — Assessment & Plan Note (Signed)
IMproved control  Patient's medications were reviewed today and patient education was given. Computerized medication calendar was adjusted/completed   Plan  Cont on current regimen

## 2013-07-14 NOTE — Progress Notes (Signed)
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 Minimally better from albuterol.  Much better resp symptoms p prednisone for cluster ha, def improvement in head congestion, also less cough with prednsione.   cc having more cough at hs > white mucus, freq exac in am's, better p coffeee and gets around to taking am meds only an hour or two p rising. rec Stop foradil Start symbicort 160  Take 2 puffs first thing in am and then another 2 puffs about 12 hours later.  Work on inhaler technique:  .    Prednisone 10 mg take  4 each am x 2 days,   2 each am x 2 days,  1 each am x2days and stop Please schedule a follow up office visit in 4 weeks, sooner if needed with pft's   12/02/2011 f/u ov/Wert cc better on prednisone then some worse off it but overall  Better vs baseline doe. No cough rec Dulera 200 Take 2 puffs first thing in am and then another 2 puffs about 12 hours later and if you like it fill the prescription  Continue spiriva each am only - take this immediately after dulera  Work on [employer] perfect inhaler technique      02/19/2012  post hospital followup. Patient was admitted March 27 of 02/06/2012 for a COPD exacerbation. He was treated with IV antibiotics, nebulized bronchodilators, IV steroids. He was discharged on doxycycline and a steroid taper. Chest x-ray showed COPD changes without any acute process. He was changed off his Spiriva to Atrovent nebulizer. He was restarted on Dulera  2 puffs twice daily.  Since  discharge. Patient feels improved w/ decreased cough and congestion .  He has not smoked x 4 weeks. He has finished all his antibiotics and steroids. He will not be able to use atrovent neb due to work schedule.  rec Restart Spiriva  1 puff daily  Stop Atrovent NEB  For Rescue USE ONLY::Try to rest first then if not improved may go to inhaler or neb  May use Xopenex Inhaler 2 puffs every 4 hrs As needed  Wheezing-/trouble breathing - this is your rescue inhaler. -away from home  May use Albuterol NEB every 4 hr as needed for wheezing , trouble breathing - at home  Continue on Dulera 2 puffs Twice daily     03/01/2012 f/u ov/Wert cc breathing tends worse daily (vs on prednisone) toward end of shift (2pm to 830 pm).  Wakes up each am with rattling producing nothing @  Nl wake up time and not much albuterol use daytime in any form.  Prev aecopd ? Due to change to foradil instead of maintaining dulera and increased cig use which continues  "no more than 1-2 per week" rec Prednisone 10 mg take  4 each am x 2 days,   2 each am x 2 days,  1 each am x2days and stop  Stop smoking completely before smoking completely stops you! Only use your  albuterol (plan B xopenex, Plan C is nebulizer) as a rescue medication o be used up to every 4 hours if needed  Work on perfecting  inhaler technique   04/20/2012 f/u ov/Wert last cigarette 02/2012 overall no change doe and cough, no purulent sputum rec Congratulations on not smoking - it's the most important aspect of your care  For cough > mucinex 600 up to  2 every 12 hours For breathing xopenex hfa(your inhaler) up to 2 puffs every to 4 hours and only use the nebulizer if can't catch your breathing after the inhaler   04/25/2013 f/u ov/Wert re copd Chief Complaint  Patient presents with  . Follow-up    Breathing some worse with hot/humid weather. No other new co's today.    Mowed front yard self propelled without stopping and then had to stop every few  minutes  on back yard but still  rarely using saba  rec Dulera and Spiriva are Plan A = automatic =  take these "no matter what" Only use your albuterol (plan B Proaire/  Plan C is the nebulizer)  as   rescue medication to be used if you can't catch your breath by resting or doing a relaxed purse lip breathing pattern. The less you use it, the better it will work when you need it. Ok to use up to every 4 hours if needed  For cough and congestion > mucinex up to 1200 mg every 12 hours.  06/13/2013 acute  ov/Wert re ? [name] Chief Complaint  Patient presents with  . Acute Visit    increased SOB, rattling in chest, cough - prod in the early mornings.  Fever up to  100.8 in the evenings.    woke up 8/3  Feeling bad with purulent sputm then febrile pm 8/3 and 8/4 no rigors, min increase in saba use just as hfa, not neb. >zpack / pred   06/23/13 Acute OV  pt reports breathing had improved until 8/14  he woke up with SOB, prod cough.  still having some DOE, prod cough with white foamy mucus, chest tightness, wheezing.  CXR> bilateral LL opacities L>R  Levaquin 750mg  daily for 7 days Mucinex DM Twice daily  As needed  Cough/congestion  Prednisone taper over next week  06/28/2013 f/u ov/Wert f/u ? pna Chief Complaint  Patient presents with  . 1 Week ROV    Reports severe coughing, chest tightness, clear mucus production. Had CXR this morning.  fever gone, cough still severe > clear mucus better than it was Breathing better 8/18 then worse since with increases need fo rsaba but no rsting sob  >>Pred taper   07/14/2013 Follow up and med review  Pt returns for follow up for PNA and med review  We reviewed all his medications and organized them into a med calendar  With pt education  Appears to be taking meds correctly.  Was treated for a LLL PNA , has finished 7 days of Levaquin 750mg . Feeling better with less cough and wheezing .  No fever, hemoptysis, chest pain or orthopnea.   CXR today shows      Current Medications, Allergies, Past Medical History, Past Surgical History, Family History, and Social History were reviewed in [employer] record.  ROS  The following are not active complaints unless bolded sore throat, dysphagia, dental problems, itching, sneezing,  , ear ache,   sweats, unintended wt loss, pleuritic or exertional cp, hemoptysis,  orthopnea pnd or leg swelling, presyncope, palpitations,  heartburn, abdominal pain, anorexia, nausea, vomiting, diarrhea  or change in bowel or urinary habits, change in stools or urine, dysuria,hematuria,  rash, arthralgias, visual complaints, headache, numbness weakness or ataxia or problems with walking or coordination,  change in mood/affect or memory.                             Objective:       amb wm  NAD, well nourished   Wt 188 01/09/12 >  07/27/2012  194 > 196 10/24/2012 > 01/23/2013  202> 200 04/25/2013 > 194 06/13/2013 >164 8/15 > 06/28/2013  191 >199 07/14/2013    HEENT mild turbinate edema.  Oropharynx no thrush or excess pnd or cobblestoning.  No JVD or cervical adenopathy. Mild accessory muscle hypertrophy. Trachea midline, nl thryroid. Chest was hyperinflated by percussion with diminished breath sounds and moderate increased exp time without wheeze. Hoover sign positive at mid inspiration. Regular rate and rhythm without murmur gallop or rub or increase P2 or edema.  Abd: no hsm, nl excursion. Ext warm without cyanosis or clubbing.     CXR  06/28/2013 :  Although there has been partial clearing of the bilateral lower lobe airspace opacities compared with the most recent prior study, there is a new focal left lower lobe density. As this was not present 2 weeks ago, this still likely represents focal pneumonia. Continued radiographic followup is recommended to document clearing.      CXR 07/14/2013 >>IMPRESSION: COPD. Improving but persistent density in the left lung base. Recommend continued  followup.   Assessment & Plan:

## 2013-07-17 NOTE — Addendum Note (Signed)
Addended by: Boone Master E on: 07/17/2013 11:47 AM   Modules accepted: Orders, Medications

## 2013-07-25 ENCOUNTER — Ambulatory Visit: Payer: BC Managed Care – PPO | Admitting: Internal Medicine

## 2013-08-28 ENCOUNTER — Ambulatory Visit: Payer: BC Managed Care – PPO | Admitting: Internal Medicine

## 2013-08-29 ENCOUNTER — Encounter: Payer: Self-pay | Admitting: Internal Medicine

## 2013-09-08 ENCOUNTER — Other Ambulatory Visit: Payer: Self-pay | Admitting: Internal Medicine

## 2013-10-02 ENCOUNTER — Ambulatory Visit (INDEPENDENT_AMBULATORY_CARE_PROVIDER_SITE_OTHER)
Admission: RE | Admit: 2013-10-02 | Discharge: 2013-10-02 | Disposition: A | Discharge status: Home / Self Care | Payer: BC Managed Care – PPO | Source: Ambulatory Visit | Attending: Pulmonary Disease | Admitting: Pulmonary Disease

## 2013-10-02 ENCOUNTER — Encounter: Payer: Self-pay | Admitting: Pulmonary Disease

## 2013-10-02 ENCOUNTER — Ambulatory Visit (INDEPENDENT_AMBULATORY_CARE_PROVIDER_SITE_OTHER): Payer: BC Managed Care – PPO | Admitting: Pulmonary Disease

## 2013-10-02 ENCOUNTER — Telehealth: Payer: Self-pay | Admitting: Pulmonary Disease

## 2013-10-02 VITALS — BP 132/76 | HR 102 | Ht 76.0 in | Wt 204.0 lb

## 2013-10-02 DIAGNOSIS — J189 Pneumonia, unspecified organism: Secondary | ICD-10-CM

## 2013-10-02 DIAGNOSIS — R911 Solitary pulmonary nodule: Secondary | ICD-10-CM

## 2013-10-02 DIAGNOSIS — J441 Chronic obstructive pulmonary disease with (acute) exacerbation: Secondary | ICD-10-CM

## 2013-10-02 MED ORDER — PREDNISONE 10 MG PO TABS: ORAL_TABLET | ORAL | Status: DC

## 2013-10-02 NOTE — Assessment & Plan Note (Signed)
Prednisone 10 mg -Take 4 tabs  daily with food x 4 days, then 3 tabs daily x 4 days, then 2 tabs daily x 4 days, then 1 tab daily x4 days then stop. #40 Stay on dulera & spiriva Albuterol nebs thrice daily as needed

## 2013-10-02 NOTE — Telephone Encounter (Signed)
Pt has magic jack and not able to reach him. Will forward message to Dr. Vassie Loll. Please advise thanks

## 2013-10-02 NOTE — Assessment & Plan Note (Signed)
CXR today to FU pneumonia Abx if infx noted

## 2013-10-02 NOTE — Telephone Encounter (Signed)
LLL pneumonia has resolved But nodule suggested in RUL Obtain CT chest -no contrast - for clarification once breathing improved OK to order

## 2013-10-02 NOTE — Progress Notes (Signed)
  Subjective:    Patient ID: Theodore [name], male    DOB: December 08, 1955, 57 y.o.   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 Acute Visit    MW PT. Pt c/p productive cough w/ white-clear phlem, wheezing, a lot of chest tx, increase SOB x last week   No fevers, white phlegm  Past Medical History  Diagnosis Date  . COPD (chronic obstructive pulmonary disease)   . Chronic headache   . Seasonal allergies   . Asthma      Review of Systems neg for any significant sore throat, dysphagia, itching, sneezing, nasal congestion or excess/ purulent secretions, fever, chills, sweats, unintended wt loss, pleuritic or exertional cp, hempoptysis, orthopnea pnd or change in chronic leg swelling. Also denies presyncope, palpitations, heartburn, abdominal pain, nausea, vomiting, diarrhea or change in bowel or urinary habits, dysuria,hematuria, rash, arthralgias, visual complaints, headache, numbness weakness or ataxia.     Objective:   Physical Exam  Gen. Pleasant, tall, well-nourished, in no distress, normal affect ENT - no lesions, no post nasal drip Neck: No JVD, no thyromegaly, no carotid bruits Lungs: no use of accessory muscles, no dullness to percussion, BL exp rhonchi  Cardiovascular: Rhythm regular, heart sounds  normal, no murmurs or gallops, no peripheral edema Abdomen: soft and non-tender, no hepatosplenomegaly, BS normal. Musculoskeletal: No deformities, no cyanosis or clubbing Neuro:  alert, non focal       Assessment & Plan:

## 2013-10-02 NOTE — Patient Instructions (Signed)
Prednisone 10 mg -Take 4 tabs  daily with food x 4 days, then 3 tabs daily x 4 days, then 2 tabs daily x 4 days, then 1 tab daily x4 days then stop. #40 Stay on dulera & spiriva Albuterol nebs thrice daily as needed CXR today to FU pneumonia

## 2013-10-03 NOTE — Telephone Encounter (Signed)
Pt advised and CT ordered. Carron Curie, CMA

## 2013-10-03 NOTE — Telephone Encounter (Signed)
lmomtcb x1 

## 2013-10-04 ENCOUNTER — Telehealth: Payer: Self-pay | Admitting: Internal Medicine

## 2013-10-04 MED ORDER — AMOXICILLIN-POT CLAVULANATE 875-125 MG PO TABS: 1.0000 | ORAL_TABLET | Freq: Two times a day (BID) | ORAL | Status: DC

## 2013-10-04 NOTE — Telephone Encounter (Signed)
Augmentin 875 mg take one pill twice daily  X 10 days - take at breakfast and supper with large glass of water.   Albuterol neb up to every3 hours and if not comforable at rest p neb go to ER

## 2013-10-04 NOTE — Telephone Encounter (Signed)
Called spoke with patient who reported that his breathing has worsened since last ov, onset yesterday afternoon.  Reports his prod cough is not producing as much clear mucus and it is more difficult to cough up.  He is now having some dark yellow nasal discharge and head congestion.  Taking his albuterol neb every 4 hours and c/o increased SOB with minimal exertion.  Pt was just seen on 11.24.14 by RA for acute visit: Patient Instructions     Prednisone 10 mg -Take 4 tabs daily with food x 4 days, then 3 tabs daily x 4 days, then 2 tabs daily x 4 days, then 1 tab daily x4 days then stop. #40  Stay on dulera & spiriva  Albuterol nebs thrice daily as needed  CXR today to FU pneumonia   Pt is still taking the prednisone and did receive his cxr results per the 11.24.14 phone note: Oretha Milch, MD at 10/02/2013 5:40 PM     LLL pneumonia has resolved  But nodule suggested in RUL  Obtain CT chest -no contrast - for clarification once breathing improved  OK to order   Dr Sherene Sires with no openings this afternoon.  RA with no openings and there are no other providers in the office.  Dr Sherene Sires please advise, thank you.  [facility] [place] Allergies  Allergen Reactions  . Naproxen Sodium Itching

## 2013-10-04 NOTE — Telephone Encounter (Signed)
Called spoke with patient, advised of albuterol neb refills pending at the pharmacy Pt verbalized his understanding and denied any further questions/concerns at this time Will sign off

## 2013-10-04 NOTE — Telephone Encounter (Signed)
Called spoke with patient, discussed MW's recommendations with him.  Advised of augmentin rx and when/how to take it and that if he is uncomfortable at rest AFTER the neb, to go to the ER.  Pt verbalized his understanding and denied any questions concerning rx.  Pt did ask if he has any refills on his albuterol neb soln.  Advised pt will call his pharmacy to check, and if not will call in refills as well.  [facility], spoke with pharmacist Weston Brass.  Verbal order for the augmentin given and per Weston Brass, pt has enough albuterol neb refills to last until August 2015.  ATC pt to inform him of the albuterol, but line rang with no answer and automated message stating that voicemail is not set up.

## 2013-10-09 ENCOUNTER — Encounter: Payer: Self-pay | Admitting: Internal Medicine

## 2013-10-09 ENCOUNTER — Ambulatory Visit (INDEPENDENT_AMBULATORY_CARE_PROVIDER_SITE_OTHER): Payer: BC Managed Care – PPO | Admitting: Internal Medicine

## 2013-10-09 VITALS — BP 120/80 | HR 98 | Temp 98.3°F

## 2013-10-09 DIAGNOSIS — J441 Chronic obstructive pulmonary disease with (acute) exacerbation: Secondary | ICD-10-CM

## 2013-10-09 MED ORDER — PREDNISONE 20 MG PO TABS: ORAL_TABLET | ORAL | Status: DC

## 2013-10-09 MED ORDER — PANTOPRAZOLE SODIUM 40 MG PO TBEC: 40.0000 mg | DELAYED_RELEASE_TABLET | Freq: Every day | ORAL | Status: DC

## 2013-10-09 MED ORDER — FAMOTIDINE 20 MG PO TABS: ORAL_TABLET | ORAL | Status: DC

## 2013-10-09 NOTE — Progress Notes (Signed)
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 Minimally better from albuterol.  Much better resp symptoms p prednisone for cluster ha, def improvement in head congestion, also less cough with prednsione.   cc having more cough at hs > white mucus, freq exac in am's, better p coffeee and gets around to taking am meds only an hour or two p rising. rec Stop foradil Start symbicort 160  Take 2 puffs first thing in am and then another 2 puffs about 12 hours later.  Work on inhaler technique:  .    Prednisone 10 mg take  4 each am x 2 days,   2 each am x 2 days,  1 each am x2days and stop Please schedule a follow up office visit in 4 weeks, sooner if needed with pft's   12/02/2011 f/u ov/Theodore [name] cc better on prednisone then some worse off it but overall  Better vs baseline doe. No cough rec Dulera 200 Take 2 puffs first thing in am and then another 2 puffs about 12 hours later and if you like it fill the prescription  Continue spiriva each am only - take this immediately after dulera  Work on [employer] perfect inhaler technique      02/19/2012  post hospital followup. Patient was admitted March 27 of 02/06/2012 for a COPD exacerbation. He was treated with IV antibiotics, nebulized bronchodilators, IV steroids. He was discharged on doxycycline and a steroid taper. Chest x-ray showed COPD changes without any acute process. He was changed off his Spiriva to Atrovent nebulizer. He was restarted on Dulera  2 puffs twice daily.  Since  discharge. Patient feels improved w/ decreased cough and congestion .  He has not smoked x 4 weeks. He has finished all his antibiotics and steroids. He will not be able to use atrovent neb due to work schedule.  rec Restart Spiriva  1 puff daily  Stop Atrovent NEB  For Rescue USE ONLY::Try to rest first then if not improved may go to inhaler or neb  May use Xopenex Inhaler 2 puffs every 4 hrs As needed  Wheezing-/trouble breathing - this is your rescue inhaler. -away from home  May use Albuterol NEB every 4 hr as needed for wheezing , trouble breathing - at home  Continue on Dulera 2 puffs Twice daily     03/01/2012 f/u ov/Theodore [name] cc breathing tends worse daily (vs on prednisone) toward end of shift (2pm to 830 pm).  Wakes up each am with rattling producing nothing @  Nl wake up time and not much albuterol use daytime in any form.  Prev aecopd ? Due to change to foradil instead of maintaining dulera and increased cig use which continues  "no more than 1-2 per week" rec Prednisone 10 mg take  4 each am x 2 days,   2 each am x 2 days,  1 each am x2days and stop  Stop smoking completely before smoking completely stops you! Only use your  albuterol (plan B xopenex, Plan C is nebulizer) as a rescue medication o be used up to every 4 hours if needed  Work on perfecting  inhaler technique   04/20/2012 f/u ov/Theodore [name] last cigarette 02/2012 overall no change doe and cough, no purulent sputum rec Congratulations on not smoking - it's the most important aspect of your care  For cough > mucinex 600 up to  2 every 12 hours For breathing xopenex hfa(your inhaler) up to 2 puffs every to 4 hours and only use the nebulizer if can't catch your breathing after the inhaler   04/25/2013 f/u ov/Theodore [name] re copd Chief Complaint  Patient presents with  . Follow-up    Breathing some worse with hot/humid weather. No other new co's today.    Mowed front yard self propelled without stopping and then had to stop every few  minutes  on back yard but still  rarely using saba  rec Dulera and Spiriva are Plan A = automatic =  take these "no matter what" Only use your albuterol (plan B Proaire/  Plan C is the nebulizer)  as   rescue medication to be used if you can't catch your breath by resting or doing a relaxed purse lip breathing pattern. The less you use it, the better it will work when you need it. Ok to use up to every 4 hours if needed  For cough and congestion > mucinex up to 1200 mg every 12 hours.  06/13/2013 acute  ov/Theodore [name] re ? Theodore [name] Chief Complaint  Patient presents with  . Acute Visit    increased SOB, rattling in chest, cough - prod in the early mornings.  Fever up to  100.8 in the evenings.    woke up 8/3  Feeling bad with purulent sputm then febrile pm 8/3 and 8/4 no rigors, min increase in saba use just as hfa, not neb. >zpack / pred   06/23/13 Acute OV  pt reports breathing had improved until 8/14  he woke up with SOB, prod cough.  still having some DOE, prod cough with white foamy mucus, chest tightness, wheezing.  CXR> bilateral LL opacities L>R  Levaquin 750mg  daily for 7 days Mucinex DM Twice daily  As needed  Cough/congestion  Prednisone taper over next week  06/28/2013 f/u ov/Theodore [name] f/u ? pna Chief Complaint  Patient presents with  . 1 Week ROV    Reports severe coughing, chest tightness, clear mucus production. Had CXR this morning.  fever gone, cough still severe > clear mucus better than it was Breathing better 8/18 then worse since with increases need for saba but no rsting sob  >>Pred taper   07/14/2013 Follow up and med review  Pt returns for follow up for PNA and med review  We reviewed all his medications and organized them into a med calendar  With pt education  Appears to be taking meds correctly.  Was treated for a LLL PNA , has finished 7 days of Levaquin 750mg . Feeling better with less cough and wheezing .  No fever, hemoptysis, chest pain or orthopnea.   CXR > COPD. Improving  but persistent density in the left lung base. Recommend continued followup  10/02/13 [name] aecopd x sev days  rec Prednisone 10 mg -Take 4 tabs  daily with food x 4 days, then 3 tabs daily x 4 days, then 2 tabs daily x 4 days, then 1 tab daily x4 days then stop. #40 Stay on dulera & spiriva Albuterol nebs thrice daily as  needed   10/04/13  Augmentin 875 mg take one pill twice daily  X 10 days - take at breakfast and supper with large glass of water.   Albuterol neb up to every3 hours and if not comforable at rest p neb go to ER     10/09/2013 f/u ov/Theodore [name] re: aecopd/ no med calendar/ confused with action plan Chief Complaint  Patient presents with  . Acute Visit    Pt c/o increased SOB, chest tightness and chest congestion x 1 wk. He states gets out of breath just standing up or walking a few steps.   Ok at rest RA Last neb 3  h prior to OV . No purulent secretions on augmentin since 11/26 Mint def makes breathing worse  No obvious pattern in  day to day or daytime variabilty or assoc  cp or   subjective wheeze overt sinus or hb symptoms. No unusual exp hx or h/o childhood pna/ asthma or knowledge of premature birth.    Also denies any obvious fluctuation of symptoms with weather or environmental changes or other aggravating or alleviating factors except as outlined above   Current Medications, Allergies, Complete Past Medical History, Past Surgical History, Family History, and Social History were reviewed in [employer] record.  ROS  The following are not active complaints unless bolded sore throat, dysphagia, dental problems, itching, sneezing,  nasal congestion or excess/ purulent secretions, ear ache,   fever, chills, sweats, unintended wt loss, pleuritic or exertional cp, hemoptysis,  orthopnea pnd or leg swelling, presyncope, palpitations, heartburn, abdominal pain, anorexia, nausea, vomiting, diarrhea  or change in bowel or urinary habits, change in stools  or urine, dysuria,hematuria,  rash, arthralgias, visual complaints, headache, numbness weakness or ataxia or problems with walking or coordination,  change in mood/affect or memory.                Objective:       amb wm  NAD, mild increased wob at rest but talking in full sentences with  purse lipping when not speaking.  In w/c for first time  Wt 188 01/09/12 >  07/27/2012  194 > 196 10/24/2012 > 01/23/2013  202> 200 04/25/2013 > 194 06/13/2013 >164 8/15 > 06/28/2013  191 >199 07/14/2013  >  HEENT mild turbinate edema.  Oropharynx no thrush or excess pnd or cobblestoning.  No JVD or cervical adenopathy. Mild accessory muscle hypertrophy. Trachea midline, nl thryroid. Chest was hyperinflated by percussion with diminished breath sounds and moderate increased exp time with distant  wheeze. Hoover sign positive at mid inspiration. Regular rate and rhythm without murmur gallop or rub or increase P2 or edema.  Abd: no hsm, nl excursion. Ext warm without cyanosis or clubbing.        cxr 10/02/13  The heart size and mediastinal contours are within normal limits.  The lungs are hyperinflated. There is no focal infiltrate, pulmonary  edema, or pleural effusion. There is chronic scarring of left lung  base. There is question 1 cm nodule in the lateral right upper lobe.  The visualized skeletal structures are stable.      Assessment & Plan:

## 2013-10-09 NOTE — Assessment & Plan Note (Addendum)
DDX of  difficult airways managment all start with A and  include Adherence, Ace Inhibitors, Acid Reflux, Active Sinus Disease, Alpha 1 Antitripsin deficiency, Anxiety masquerading as Airways dz,  ABPA,  allergy(esp in young), Aspiration (esp in elderly), Adverse effects of DPI,  Active smokers, plus two Bs  = Bronchiectasis and Beta blocker use..and one C= CHF   Adherence is always the initial "prime suspect" and is a multilayered concern that requires a "trust but verify" approach in every patient - starting with knowing how to use medications, especially inhalers, correctly, keeping up with refills and understanding the fundamental difference between maintenance and prns vs those medications only taken for a very short course and then stopped and not refilled.  - not using med calendar/ action plan as instructed, reviewed  ? Acid (or non-acid) GERD > always difficult to exclude as up to 75% of pts in some series report no assoc GI/ Heartburn symptoms> rec max (24h)  acid suppression and diet restrictions/ reviewed and instructions given in writting   ? Active sinus dz > finish augmentin  ? Allergy/ asthma > increase pred to ceiling of 80 mg daily and floor of 20 mg daily until returns for med rec  Only Some better p neb rx in office so Offered admit but he wants to continue to try to outpt rx and as long as comfortable p neb up to every 3 h for now that's fine> otherwise to ER   To keep things simple, I have asked the patient to first separate medicines that are perceived as maintenance, that is to be taken daily "no matter what", from those medicines that are taken on only on an as-needed basis and I have given the patient examples of both, and then return to see our NP to generate a  detailed  medication calendar which should be followed until the next physician sees the patient and updates it.    See instructions for specific recommendations which were reviewed directly with the patient who was  given a copy with highlighter outlining the key components.

## 2013-10-09 NOTE — Patient Instructions (Addendum)
Pantoprazole (protonix) 40 mg   Take 30-60 min before first meal of the day and Pepcid (famotidine) 20 mg one bedtime until return to office - this is the best way to tell whether stomach acid is contributing to your problem.    Predisone 40 mg twice daily x 4 days, then 40 mg x 4 days, then 20 mg per day until return  Ok to use neb up to every 3 hours if can't catch your breath  GERD (REFLUX)  is an extremely common cause of respiratory symptoms, many times with no significant heartburn at all.    It can be treated with medication, but also with lifestyle changes including avoidance of late meals, excessive alcohol, smoking cessation, and avoid fatty foods, chocolate, peppermint, colas, red wine, and acidic juices such as orange juice.  NO MINT OR MENTHOL PRODUCTS SO NO COUGH DROPS  USE SUGARLESS CANDY INSTEAD (jolley ranchers or Stover's)  NO OIL BASED VITAMINS - use powdered substitutes.    If worsen to point where can't get where can't get better even after the neb then go to ER   See [name] w/in 2 weeks with all your medications, even over the counter meds, separated in two separate bags, the ones you take no matter what vs the ones you stop once you feel better and take only as needed when you feel you need them.   Tammy  will generate for you a new user friendly medication calendar that will put [place] all on the same page re: your medication use.     Without this process, it simply isn't possible to assure that we are providing  your outpatient care  with  the attention to detail we feel you deserve.   If we cannot assure that you're getting that kind of care,  then we cannot manage your problem effectively from this clinic.  Once you have seen Tammy and we are sure that we're all on the same page with your medication use she will arrange follow up with me.

## 2013-10-13 ENCOUNTER — Telehealth: Payer: Self-pay | Admitting: Internal Medicine

## 2013-10-13 MED ORDER — AZITHROMYCIN 250 MG PO TABS: ORAL_TABLET | ORAL | Status: DC

## 2013-10-13 MED ORDER — PREDNISONE 10 MG PO TABS: ORAL_TABLET | ORAL | Status: DC

## 2013-10-13 MED ORDER — ALBUTEROL SULFATE (2.5 MG/3ML) 0.083% IN NEBU: 2.5000 mg | INHALATION_SOLUTION | RESPIRATORY_TRACT | Status: DC | PRN

## 2013-10-13 NOTE — Telephone Encounter (Signed)
yes

## 2013-10-13 NOTE — Telephone Encounter (Signed)
RX has been sent. Pt is aware. Nothing further needed

## 2013-10-13 NOTE — Telephone Encounter (Signed)
Called and spoke with pt and he stated that for the last couple of days he has low grade fever, cough with yellow sputum.  Pt is requesting that something be called in for him.  MW please advise.  Thanks  Allergies  Allergen Reactions  . Naproxen Sodium Itching   Current Outpatient Prescriptions on File Prior to Visit  Medication Sig Dispense Refill  . albuterol (PROVENTIL HFA;VENTOLIN HFA) 108 (90 BASE) MCG/ACT inhaler Inhale 2 puffs into the lungs every 4 (four) hours as needed for wheezing or shortness of breath (((PLAN A))).      Marland Kitchen albuterol (PROVENTIL) (2.5 MG/3ML) 0.083% nebulizer solution Take 3 mLs (2.5 mg total) by nebulization every 3 (three) hours as needed for wheezing or shortness of breath (((PLAN B))). DX 496  600 mL  1  . amoxicillin-clavulanate (AUGMENTIN) 875-125 MG per tablet Take 1 tablet by mouth 2 (two) times daily.  20 tablet  0  . dextromethorphan-guaiFENesin (MUCINEX DM) 30-600 MG per 12 hr tablet Take 1 tablet by mouth every 12 (twelve) hours as needed (with flutter valve).       Elwin Sleight 200-5 MCG/ACT AERO inhale 2 puffs by mouth FIRST THING IN THE MORNING then inhale 2 puffs by mouth ABOUT 12 HOURS LATER  13 g  11  . famotidine (PEPCID) 20 MG tablet One at bedtime  30 tablet  2  . fluticasone (FLONASE) 50 MCG/ACT nasal spray Place 2 sprays into the nose every morning.       Marland Kitchen ibuprofen (ADVIL,MOTRIN) 200 MG tablet Per bottle as needed for pain      . Misc. Devices (ACAPELLA) MISC Use as directed  1 each  0  . pantoprazole (PROTONIX) 40 MG tablet Take 1 tablet (40 mg total) by mouth daily. Take 30-60 min before first meal of the day  30 tablet  2  . predniSONE (DELTASONE) 20 MG tablet 2 every 12 hours x 4 days, then 2 each am x 4 days then stay on 1 daily with breakfast  40 tablet  0  . sodium chloride (OCEAN) 0.65 % SOLN nasal spray Place 2 sprays into the nose as needed for congestion.      Marland Kitchen tiotropium (SPIRIVA HANDIHALER) 18 MCG inhalation capsule Place 1 capsule  (18 mcg total) into inhaler and inhale daily.  30 capsule  11  . [DISCONTINUED] budesonide-formoterol (SYMBICORT) 160-4.5 MCG/ACT inhaler Take 2 puffs first thing in am and then another 2 puffs about 12 hours later.     1 Inhaler  12   No current facility-administered medications on file prior to visit.

## 2013-10-13 NOTE — Telephone Encounter (Signed)
Per OV note 10/09/13: Ok to use neb up to every 3 hours if can't catch your breath --   I called pt and he reports he is having to use his albuterol neb every 3 hrs to catch his breathe. He is requesting an updated RX be sent to the pharmacy. MW please advise if okay to do so? thanks

## 2013-10-13 NOTE — Telephone Encounter (Signed)
Called and spoke with pt and he is aware of BQ recs since MW was out of the office.  Pt is aware of meds sent to the pharmacy and nothing further is needed.

## 2013-10-13 NOTE — Telephone Encounter (Signed)
Please call in a Z-pack and prednisone taper: Take 40mg  po daily for 3 days, then take 30mg  po daily for 3 days, then take 20mg  po daily for two days, then take 10mg  po daily for 2 days

## 2013-10-18 ENCOUNTER — Ambulatory Visit (INDEPENDENT_AMBULATORY_CARE_PROVIDER_SITE_OTHER)
Admission: RE | Admit: 2013-10-18 | Discharge: 2013-10-18 | Disposition: A | Discharge status: Home / Self Care | Payer: BC Managed Care – PPO | Source: Ambulatory Visit | Attending: Pulmonary Disease | Admitting: Pulmonary Disease

## 2013-10-18 DIAGNOSIS — R911 Solitary pulmonary nodule: Secondary | ICD-10-CM

## 2013-10-23 ENCOUNTER — Ambulatory Visit: Payer: BC Managed Care – PPO | Admitting: Internal Medicine

## 2013-10-24 ENCOUNTER — Other Ambulatory Visit (INDEPENDENT_AMBULATORY_CARE_PROVIDER_SITE_OTHER): Payer: BC Managed Care – PPO

## 2013-10-24 ENCOUNTER — Ambulatory Visit (INDEPENDENT_AMBULATORY_CARE_PROVIDER_SITE_OTHER): Payer: BC Managed Care – PPO | Admitting: Adult Health

## 2013-10-24 ENCOUNTER — Encounter: Payer: Self-pay | Admitting: Adult Health

## 2013-10-24 VITALS — BP 138/76 | HR 98 | Temp 99.0°F | Ht 76.0 in | Wt 208.6 lb

## 2013-10-24 DIAGNOSIS — J441 Chronic obstructive pulmonary disease with (acute) exacerbation: Secondary | ICD-10-CM

## 2013-10-24 DIAGNOSIS — R918 Other nonspecific abnormal finding of lung field: Secondary | ICD-10-CM | POA: Insufficient documentation

## 2013-10-24 DIAGNOSIS — J449 Chronic obstructive pulmonary disease, unspecified: Secondary | ICD-10-CM

## 2013-10-24 LAB — BASIC METABOLIC PANEL
BUN: 19 mg/dL (ref 6–23)
CO2: 26 mEq/L (ref 19–32)
Chloride: 99 mEq/L (ref 96–112)
Potassium: 4.4 mEq/L (ref 3.5–5.1)

## 2013-10-24 LAB — BRAIN NATRIURETIC PEPTIDE: Pro B Natriuretic peptide (BNP): 28 pg/mL (ref 0.0–100.0)

## 2013-10-24 MED ORDER — LEVOFLOXACIN 500 MG PO TABS: 500.0000 mg | ORAL_TABLET | Freq: Every day | ORAL | Status: AC

## 2013-10-24 MED ORDER — LEVALBUTEROL HCL 0.63 MG/3ML IN NEBU
0.6300 mg | INHALATION_SOLUTION | Freq: Once | RESPIRATORY_TRACT | Status: AC
  Administered 2013-10-24: 0.63 mg via RESPIRATORY_TRACT

## 2013-10-24 MED ORDER — METHYLPREDNISOLONE ACETATE 80 MG/ML IJ SUSP
120.0000 mg | Freq: Once | INTRAMUSCULAR | Status: AC
  Administered 2013-10-24: 120 mg via INTRAMUSCULAR

## 2013-10-24 NOTE — Assessment & Plan Note (Addendum)
Slow to resolve exacerbation in pt with underlying severe COPD  cxr /cT chest without acute process.  Check bnp although does not appear to be in fluid overload. rechallenge with abx  If not improving consider CT sinus on return.  Patient's medications were reviewed today and patient education was given. Computerized medication calendar was adjusted/completed   Depo medrol and xopenex given in office  Steroid talk given w/ pt educaiton  Advised on yogurt/probiotic for frequent abx use.    Plan  Levaquin 500mg  daily for 7 days  Mucinex DM Twice daily  As needed  Cough/congestion  Continue on Prednisone 20mg  daily until seen back in office . Follow med calendar closely and bring to each visit.   Follow up in 2-3 week with Dr. Sherene Sires and As needed   Please contact office for sooner follow up if symptoms do not improve or worsen or seek emergency care

## 2013-10-24 NOTE — Assessment & Plan Note (Signed)
Ct chest 10/18/13  There are scattered small subpleural nodules including some in  the right upper lobe which have a somewhat tree-in-bud distribution,  suggesting a postinflammatory process. No highly suspicious nodules  are identified. Given risk factors for bronchogenic carcinoma,  follow-up chest CT at 1 year is recommended. This recommendation   Plan  Repeat CT in 1 year ~10/2014

## 2013-10-24 NOTE — Progress Notes (Signed)
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 Minimally better from albuterol.  Much better resp symptoms p prednisone for cluster ha, def improvement in head congestion, also less cough with prednsione.   cc having more cough at hs > white mucus, freq exac in am's, better p coffeee and gets around to taking am meds only an hour or two p rising. rec Stop foradil Start symbicort 160  Take 2 puffs first thing in am and then another 2 puffs about 12 hours later.  Work on inhaler technique:  .    Prednisone 10 mg take  4 each am x 2 days,   2 each am x 2 days,  1 each am x2days and stop Please schedule a follow up office visit in 4 weeks, sooner if needed with pft's   12/02/2011 f/u ov/Wert cc better on prednisone then some worse off it but overall  Better vs baseline doe. No cough rec Dulera 200 Take 2 puffs first thing in am and then another 2 puffs about 12 hours later and if you like it fill the prescription  Continue spiriva each am only - take this immediately after dulera  Work on [employer] perfect inhaler technique      02/19/2012  post hospital followup. Patient was admitted March 27 of 02/06/2012 for a COPD exacerbation. He was treated with IV antibiotics, nebulized bronchodilators, IV steroids. He was discharged on doxycycline and a steroid taper. Chest x-ray showed COPD changes without any acute process. He was changed off his Spiriva to Atrovent nebulizer. He was restarted on Dulera  2 puffs twice daily.  Since discharge. Patient  feels improved w/ decreased cough and congestion .  He has not smoked x 4 weeks. He has finished all his antibiotics and steroids. He will not be able to use atrovent neb due to work schedule.  rec Restart Spiriva  1 puff daily  Stop Atrovent NEB  For Rescue USE ONLY::Try to rest first then if not improved may go to inhaler or neb  May use Xopenex Inhaler 2 puffs every 4 hrs As needed  Wheezing-/trouble breathing - this is your rescue inhaler. -away from home  May use Albuterol NEB every 4 hr as needed for wheezing , trouble breathing - at home  Continue on Dulera 2 puffs Twice daily     03/01/2012 f/u ov/Wert cc breathing tends worse daily (vs on prednisone) toward end of shift (2pm to 830 pm).  Wakes up each am with rattling producing nothing @  Nl wake up time and not much albuterol use daytime in any form.  Prev aecopd ? Due to change to foradil instead of maintaining dulera and increased cig use which continues  "no more than 1-2 per week" rec Prednisone 10 mg take  4 each am x 2 days,   2 each am x 2 days,  1 each am x2days and stop  Stop smoking completely before smoking completely stops you! Only use your albuterol (plan B xopenex, Plan  C is nebulizer) as a rescue medication o be used up to every 4 hours if needed  Work on [employer]  inhaler technique   04/20/2012 f/u ov/Wert last cigarette 02/2012 overall no change doe and cough, no purulent sputum rec Congratulations on not smoking - it's the most important aspect of your care  For cough > mucinex 600 up to  2 every 12 hours For breathing xopenex hfa(your inhaler) up to 2 puffs every to 4 hours and only use the nebulizer if can't catch your breathing after the inhaler   04/25/2013 f/u ov/Wert re copd Chief Complaint  Patient presents with  . Follow-up    Breathing some worse with hot/humid weather. No other new co's today.    Mowed front yard self propelled without stopping and then had to stop every few minutes  on back yard  but still  rarely using saba  rec Dulera and Spiriva are Plan A = automatic =  take these "no matter what" Only use your albuterol (plan B Proaire/  Plan C is the nebulizer)  as   rescue medication to be used if you can't catch your breath by resting or doing a relaxed purse lip breathing pattern. The less you use it, the better it will work when you need it. Ok to use up to every 4 hours if needed  For cough and congestion > mucinex up to 1200 mg every 12 hours.  06/13/2013 acute  ov/Wert re ? [name] Chief Complaint  Patient presents with  . Acute Visit    increased SOB, rattling in chest, cough - prod in the early mornings.  Fever up to  100.8 in the evenings.    woke up 8/3  Feeling bad with purulent sputm then febrile pm 8/3 and 8/4 no rigors, min increase in saba use just as hfa, not neb. >zpack / pred   06/23/13 Acute OV  pt reports breathing had improved until 8/14  he woke up with SOB, prod cough.  still having some DOE, prod cough with white foamy mucus, chest tightness, wheezing.  CXR> bilateral LL opacities L>R  Levaquin 750mg  daily for 7 days Mucinex DM Twice daily  As needed  Cough/congestion  Prednisone taper over next week  06/28/2013 f/u ov/Wert f/u ? pna Chief Complaint  Patient presents with  . 1 Week ROV    Reports severe coughing, chest tightness, clear mucus production. Had CXR this morning.  fever gone, cough still severe > clear mucus better than it was Breathing better 8/18 then worse since with increases need for saba but no rsting sob  >>Pred taper   07/14/2013 Follow up and med review  Pt returns for follow up for PNA and med review  We reviewed all his medications and organized them into a med calendar  With pt education  Appears to be taking meds correctly.  Was treated for a LLL PNA , has finished 7 days of Levaquin 750mg . Feeling better with less cough and wheezing .  No fever, hemoptysis, chest pain or orthopnea.   CXR > COPD. Improving but persistent  density in the left lung base. Recommend continued followup  10/02/13 [name] aecopd x sev days  rec Prednisone 10 mg -Take 4 tabs  daily with food x 4 days, then 3 tabs daily x 4 days, then 2 tabs daily x 4 days, then 1 tab daily x4 days then stop. #40 Stay on dulera & spiriva Albuterol nebs thrice daily as needed   10/04/13  Augmentin 875 mg take one pill twice daily  X 10 days - take at breakfast and supper with large glass of water.   Albuterol neb up to every3 hours and if not comforable at rest p neb go to ER     10/09/2013 f/u ov/Wert re: aecopd/ no med calendar/ confused with action plan Chief Complaint  Patient presents with  . Acute Visit    Pt c/o increased SOB, chest tightness and chest congestion x 1 wk. He states gets out of breath just standing up or walking a few steps.   Ok at rest RA Last neb 3  h prior to OV . No purulent secretions on augmentin since 11/26 Mint def makes breathing worse >>pred taper   10/24/2013 Follow up and Med review  Patient returns for a two-week followup and medication review. We reviewed all his medications and organized them into a medication calendar with patient education. Patient appears to be taking his medications correctly. Patient has been having a slow to resolve COPD, exacerbation. He was treated with Augmentin  Steroid taper. 3 weeks ago. Patient reports symptoms only minimally improved. He is currently on prednisone 20 mg. Patient was also called in a Z-Pak 10 days ago. Patient reports he did not have any improvement with Z-Pak. He continues to have sinus congestion, drainage, productive cough with rattling mucus. Patient is using Mucinex DM and flutter valve without any help.  Patient reports that he takes his Dulera  and Spiriva on a consistent basis without any missed doses. Says his wheezing and cough worse in evening.  CT chest done last week showed scattered small subpleural nodules including some in  the right upper lobe  which have a somewhat tree-in-bud distribution, suggesting a postinflammatory process. No highly suspicious nodules Patient denies any hemoptysis, orthopnea, PND, leg swelling, nausea, vomiting, or overt reflux.     Current Medications, Allergies, Complete Past Medical History, Past Surgical History, Family History, and Social History were reviewed in [employer] record.  ROS  The following are not active complaints unless bolded sore throat, dysphagia, dental problems, itching, sneezing,   unintended wt loss, pleuritic or exertional cp, hemoptysis,  orthopnea pnd or leg swelling, presyncope, palpitations, heartburn, abdominal pain, anorexia, nausea, vomiting, diarrhea  or change in bowel or urinary habits, change in stools or urine, dysuria,hematuria,  rash, arthralgias, visual complaints, headache, numbness weakness or ataxia or problems with walking or coordination,  change in mood/affect or memory.                Objective:        Wt 188 01/09/12 >  07/27/2012  194 > 196 10/24/2012 > 01/23/2013  202> 200 04/25/2013 > 194 06/13/2013 >164 8/15 > 06/28/2013  191 >199 07/14/2013  >208 10/24/2013   HEENT mild turbinate edema.  Oropharynx no thrush or excess pnd or cobblestoning.  No JVD or cervical adenopathy. Mild accessory muscle hypertrophy. Trachea midline, nl thryroid.  Exp wheezing with coarse rhonchi  Regular rate and rhythm without murmur gallop or rub or increase P2 or edema.  Abd: no hsm, nl excursion. Ext warm without cyanosis or clubbing.        cxr 10/02/13  The heart size and mediastinal contours are within normal limits.  The lungs are hyperinflated. There is no focal infiltrate, pulmonary  edema, or pleural effusion. There is chronic scarring of left lung  base. There is question 1 cm nodule in the lateral right upper lobe.  The visualized  skeletal structures are stable.  CT chest 10/18/13  There are scattered small subpleural nodules including some in   the right upper lobe which have a somewhat tree-in-bud distribution,  suggesting a postinflammatory process. No highly suspicious nodules  are identified. 2. Moderate emphysema.     Assessment & Plan:

## 2013-10-24 NOTE — Patient Instructions (Signed)
Levaquin 500mg  daily for 7 days  Mucinex DM Twice daily  As needed  Cough/congestion  Continue on Prednisone 20mg  daily until seen back in office . Follow med calendar closely and bring to each visit.   Follow up in 2-3 week with Dr. Sherene Sires and As needed   Please contact office for sooner follow up if symptoms do not improve or worsen or seek emergency care

## 2013-10-24 NOTE — Addendum Note (Signed)
Addended by: Tommie Sams on: 10/24/2013 10:58 AM   Modules accepted: Orders

## 2013-11-07 ENCOUNTER — Ambulatory Visit (INDEPENDENT_AMBULATORY_CARE_PROVIDER_SITE_OTHER): Payer: BC Managed Care – PPO | Admitting: Internal Medicine

## 2013-11-07 ENCOUNTER — Encounter: Payer: Self-pay | Admitting: Internal Medicine

## 2013-11-07 VITALS — BP 132/70 | HR 72 | Temp 98.2°F | Ht 76.0 in | Wt 214.0 lb

## 2013-11-07 DIAGNOSIS — J449 Chronic obstructive pulmonary disease, unspecified: Secondary | ICD-10-CM

## 2013-11-07 DIAGNOSIS — J4489 Other specified chronic obstructive pulmonary disease: Secondary | ICD-10-CM

## 2013-11-07 MED ORDER — PREDNISONE 10 MG PO TABS: ORAL_TABLET | ORAL | Status: DC

## 2013-11-07 MED ORDER — ALBUTEROL SULFATE HFA 108 (90 BASE) MCG/ACT IN AERS: 2.0000 | INHALATION_SPRAY | Freq: Four times a day (QID) | RESPIRATORY_TRACT | Status: DC | PRN

## 2013-11-07 NOTE — Progress Notes (Signed)
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 Minimally better from albuterol.  Much better resp symptoms p prednisone for cluster ha, def improvement in head congestion, also less cough with prednsione.   cc having more cough at hs > white mucus, freq exac in am's, better p coffeee and gets around to taking am meds only an hour or two p rising. rec Stop foradil Start symbicort 160  Take 2 puffs first thing in am and then another 2 puffs about 12 hours later.  Work on inhaler technique:  .    Prednisone 10 mg take  4 each am x 2 days,   2 each am x 2 days,  1 each am x2days and stop Please schedule a follow up office visit in 4 weeks, sooner if needed with pft's   12/02/2011 f/u ov/Wert cc better on prednisone then some worse off it but overall  Better vs baseline doe. No cough rec Dulera 200 Take 2 puffs first thing in am and then another 2 puffs about 12 hours later and if you like it fill the prescription  Continue spiriva each am only - take this immediately after dulera  Work on [employer] perfect inhaler technique      02/19/2012  post hospital followup. Patient was admitted March 27 of 02/06/2012 for a COPD exacerbation. He was treated with IV antibiotics, nebulized bronchodilators, IV steroids. He was discharged on doxycycline and a steroid taper. Chest x-ray showed COPD changes without any acute process. He was changed off his Spiriva to Atrovent nebulizer. He was restarted on Dulera  2 puffs twice daily.  Since discharge. Patient  feels improved w/ decreased cough and congestion .  He has not smoked x 4 weeks. He has finished all his antibiotics and steroids. He will not be able to use atrovent neb due to work schedule.  rec Restart Spiriva  1 puff daily  Stop Atrovent NEB  For Rescue USE ONLY::Try to rest first then if not improved may go to inhaler or neb  May use Xopenex Inhaler 2 puffs every 4 hrs As needed  Wheezing-/trouble breathing - this is your rescue inhaler. -away from home  May use Albuterol NEB every 4 hr as needed for wheezing , trouble breathing - at home  Continue on Dulera 2 puffs Twice daily     03/01/2012 f/u ov/Wert cc breathing tends worse daily (vs on prednisone) toward end of shift (2pm to 830 pm).  Wakes up each am with rattling producing nothing @  Nl wake up time and not much albuterol use daytime in any form.  Prev aecopd ? Due to change to foradil instead of maintaining dulera and increased cig use which continues  "no more than 1-2 per week" rec Prednisone 10 mg take  4 each am x 2 days,   2 each am x 2 days,  1 each am x2days and stop  Stop smoking completely before smoking completely stops you! Only use your albuterol (plan B xopenex, Plan  C is nebulizer) as a rescue medication o be used up to every 4 hours if needed  Work on [employer]  inhaler technique   04/20/2012 f/u ov/Wert last cigarette 02/2012 overall no change doe and cough, no purulent sputum rec Congratulations on not smoking - it's the most important aspect of your care  For cough > mucinex 600 up to  2 every 12 hours For breathing xopenex hfa(your inhaler) up to 2 puffs every to 4 hours and only use the nebulizer if can't catch your breathing after the inhaler   04/25/2013 f/u ov/Wert re copd Chief Complaint  Patient presents with  . Follow-up    Breathing some worse with hot/humid weather. No other new co's today.    Mowed front yard self propelled without stopping and then had to stop every few minutes  on back yard  but still  rarely using saba  rec Dulera and Spiriva are Plan A = automatic =  take these "no matter what" Only use your albuterol (plan B Proaire/  Plan C is the nebulizer)  as   rescue medication to be used if you can't catch your breath by resting or doing a relaxed purse lip breathing pattern. The less you use it, the better it will work when you need it. Ok to use up to every 4 hours if needed  For cough and congestion > mucinex up to 1200 mg every 12 hours.  06/13/2013 acute  ov/Wert re ? [name] Chief Complaint  Patient presents with  . Acute Visit    increased SOB, rattling in chest, cough - prod in the early mornings.  Fever up to  100.8 in the evenings.    woke up 8/3  Feeling bad with purulent sputm then febrile pm 8/3 and 8/4 no rigors, min increase in saba use just as hfa, not neb. >zpack / pred   06/23/13 Acute OV  pt reports breathing had improved until 8/14  he woke up with SOB, prod cough.  still having some DOE, prod cough with white foamy mucus, chest tightness, wheezing.  CXR> bilateral LL opacities L>R  Levaquin 750mg  daily for 7 days Mucinex DM Twice daily  As needed  Cough/congestion  Prednisone taper over next week  06/28/2013 f/u ov/Wert f/u ? pna Chief Complaint  Patient presents with  . 1 Week ROV    Reports severe coughing, chest tightness, clear mucus production. Had CXR this morning.  fever gone, cough still severe > clear mucus better than it was Breathing better 8/18 then worse since with increases need for saba but no rsting sob  >>Pred taper   07/14/2013 Follow up and med review  Pt returns for follow up for PNA and med review  We reviewed all his medications and organized them into a med calendar  With pt education  Appears to be taking meds correctly.  Was treated for a LLL PNA , has finished 7 days of Levaquin 750mg . Feeling better with less cough and wheezing .  No fever, hemoptysis, chest pain or orthopnea.   CXR > COPD. Improving but persistent  density in the left lung base. Recommend continued followup  10/02/13 [name] aecopd x sev days  rec Prednisone 10 mg -Take 4 tabs  daily with food x 4 days, then 3 tabs daily x 4 days, then 2 tabs daily x 4 days, then 1 tab daily x4 days then stop. #40 Stay on dulera & spiriva Albuterol nebs thrice daily as needed   10/04/13  Augmentin 875 mg take one pill twice daily  X 10 days - take at breakfast and supper with large glass of water.   Albuterol neb up to every3 hours and if not comforable at rest p neb go to ER     10/09/2013 f/u ov/Wert re: aecopd/ no med calendar/ confused with action plan Chief Complaint  Patient presents with  . Acute Visit    Pt c/o increased SOB, chest tightness and chest congestion x 1 wk. He states gets out of breath just standing up or walking a few steps.   Ok at rest RA Last neb 3  h prior to OV . No purulent secretions on augmentin since 11/26 Mint def makes breathing worse >>pred taper   10/24/2013 Follow up and Med review  Patient returns for a two-week followup and medication review. We reviewed all his medications and organized them into a medication calendar with patient education. Patient appears to be taking his medications correctly. Patient has been having a slow to resolve COPD, exacerbation. He was treated with Augmentin  Steroid taper. 3 weeks ago. Patient reports symptoms only minimally improved. He is currently on prednisone 20 mg. Patient was also called in a Z-Pak 10 days ago. Patient reports he did not have any improvement with Z-Pak. He continues to have sinus congestion, drainage, productive cough with rattling mucus. Patient is using Mucinex DM and flutter valve without any help.  Patient reports that he takes his Dulera  and Spiriva on a consistent basis without any missed doses. Says his wheezing and cough worse in evening.  CT chest done last week showed scattered small subpleural nodules including some in  the right upper lobe  which have a somewhat tree-in-bud distribution, suggesting a postinflammatory process. No highly suspicious nodules rec Levaquin 500mg  daily for 7 days  Mucinex DM Twice daily  As needed  Cough/congestion  Continue on Prednisone 20mg  daily until seen back in office   11/07/2013 f/u ov/Wert re: aecopd still not back to baseline/ never went to ER Chief Complaint  Patient presents with  . Follow-up    Cough has improved some- still coughing up minimal white sputum.  SOB much better but not quite back at his normal baseline.   walk x sev hundred feet  Still some congested cough pred still at 20 mg per day  Much less saba dep and no longer needing neb form, just the hfa   No obvious day to day or daytime variabilty or assoc  cp or chest tightness, subjective wheeze overt sinus or hb symptoms. No unusual exp hx or h/o childhood pna/ asthma or knowledge of premature birth.  Sleeping ok without nocturnal  or early am exacerbation  of respiratory  c/o's or need for noct saba. Also denies any obvious fluctuation of symptoms with weather or environmental changes or other aggravating or alleviating factors except as outlined above   Current Medications, Allergies, Complete Past Medical History, Past Surgical History, Family History, and Social History were reviewed in [employer] record.  ROS  The following are not active complaints unless bolded sore throat, dysphagia, dental problems, itching, sneezing,  nasal congestion or excess/ purulent secretions, ear ache,   fever, chills, sweats, unintended wt loss, pleuritic or exertional cp, hemoptysis,  orthopnea pnd or leg swelling, presyncope, palpitations, heartburn, abdominal pain, anorexia, nausea, vomiting, diarrhea  or change in bowel or urinary habits, change in stools or urine, dysuria,hematuria,  rash, arthralgias, visual complaints, headache, numbness weakness or ataxia  or problems with walking or coordination,  change in  mood/affect or memory.                           Objective:        Wt 188 01/09/12 >  07/27/2012  194 > 196 10/24/2012 > 01/23/2013  202> 200 04/25/2013 > 194 06/13/2013 >164 8/15 > 06/28/2013  191 >199 07/14/2013  >208 10/24/2013 > 11/07/2013  214     HEENT mild turbinate edema.  Oropharynx no thrush or excess pnd or cobblestoning.  No JVD or cervical adenopathy. Mild accessory muscle hypertrophy. Trachea midline, nl thryroid.  Exp wheezing with coarse rhonchi  Regular rate and rhythm without murmur gallop or rub or increase P2 or edema.  Abd: no hsm, nl excursion. Ext warm without cyanosis or clubbing.        cxr 10/02/13  The heart size and mediastinal contours are within normal limits.  The lungs are hyperinflated. There is no focal infiltrate, pulmonary  edema, or pleural effusion. There is chronic scarring of left lung  base. There is question 1 cm nodule in the lateral right upper lobe.  The visualized skeletal structures are stable.  CT chest 10/18/13  There are scattered small subpleural nodules including some in  the right upper lobe which have a somewhat tree-in-bud distribution,  suggesting a postinflammatory process. No highly suspicious nodules  are identified. 2. Moderate emphysema.     Assessment & Plan:

## 2013-11-07 NOTE — Patient Instructions (Addendum)
As per med calendar, when need nebulizer as rescue, take prednisone 20 mg per day until no need, then 10 mg per day x 5 d and stop  See calendar for specific medication instructions and bring it back for each and every office visit for every healthcare provider you see.  Without it,  you may not receive the best quality medical care that we feel you deserve.  You will note that the calendar groups together  your maintenance  medications that are timed at particular times of the day.  Think of this as your checklist for what your doctor has instructed you to do until your next evaluation to see what benefit  there is  to staying on a consistent group of medications intended to keep you well.  The other group at the bottom is entirely up to you to use as you see fit  for specific symptoms that may arise between visits that require you to treat them on an as needed basis.  Think of this as your action plan or "what if" list.   Separating the top medications from the bottom group is fundamental to providing you adequate care going forward.    Please schedule a follow up office visit in 6 weeks, call sooner if needed

## 2013-11-09 NOTE — Assessment & Plan Note (Addendum)
-   HFA 75% p coaching 03/01/12 > 90% 01/23/2013    - PFT's 12/02/2011  FEV1  0.94 (24%) ratio 39 -  18% response to B 2 and DLCO 60%    -Alpha 1 genotype sent 03/01/12 >  MM  -07/14/2013 Med calendar , 10/24/2013   DDX of  difficult airways managment all start with A and  include Adherence, Ace Inhibitors, Acid Reflux, Active Sinus Disease, Alpha 1 Antitripsin deficiency, Anxiety masquerading as Airways dz,  ABPA,  allergy(esp in young), Aspiration (esp in elderly), Adverse effects of DPI,  Active smokers, plus two Bs  = Bronchiectasis and Beta blocker use..and one C= CHF  Adherence is always the initial "prime suspect" and is a multilayered concern that requires a "trust but verify" approach in every patient - starting with knowing how to use medications, especially inhalers, correctly, keeping up with refills and understanding the fundamental difference between maintenance and prns vs those medications only taken for a very short course and then stopped and not refilled.   Each maintenance medication was reviewed in detail including most importantly the difference between maintenance and as needed and under what circumstances the prns are to be used. This was done in the context of a medication calendar review which provided the patient with a user-friendly unambiguous mechanism for medication administration and reconciliation and provides an action plan for all active problems. It is critical that this be shown to every doctor  for modification during the office visit if necessary so the patient can use it as a working document.      For now appears to be steroid responsive/ dep. The goal with a chronic steroid dependent illness is always arriving at the lowest effective dose that controls the disease/symptoms and not accepting a set "formula" which is based on statistics or guidelines that don't always take into account patient  variability or the natural hx of the dz in every individual patient, which may  well vary over time.  For now therefore I recommend the patient maintain  A ceiling of 20 mg per day as indicated by need for neb form or saba  and a floor of 0 if doesn't need the neb saba

## 2013-11-18 ENCOUNTER — Other Ambulatory Visit: Payer: Self-pay | Admitting: Internal Medicine

## 2013-12-11 ENCOUNTER — Ambulatory Visit (INDEPENDENT_AMBULATORY_CARE_PROVIDER_SITE_OTHER): Payer: BC Managed Care – PPO | Admitting: Internal Medicine

## 2013-12-11 ENCOUNTER — Encounter: Payer: Self-pay | Admitting: Internal Medicine

## 2013-12-11 ENCOUNTER — Ambulatory Visit (INDEPENDENT_AMBULATORY_CARE_PROVIDER_SITE_OTHER)
Admission: RE | Admit: 2013-12-11 | Discharge: 2013-12-11 | Disposition: A | Discharge status: Home / Self Care | Payer: BC Managed Care – PPO | Source: Ambulatory Visit | Attending: Internal Medicine | Admitting: Internal Medicine

## 2013-12-11 ENCOUNTER — Telehealth: Payer: Self-pay | Admitting: Internal Medicine

## 2013-12-11 VITALS — BP 122/70 | HR 87 | Temp 97.5°F | Ht 76.0 in | Wt 208.0 lb

## 2013-12-11 DIAGNOSIS — J449 Chronic obstructive pulmonary disease, unspecified: Secondary | ICD-10-CM

## 2013-12-11 MED ORDER — PREDNISONE 10 MG PO TABS: ORAL_TABLET | ORAL | Status: DC

## 2013-12-11 NOTE — Patient Instructions (Addendum)
As per med calendar, when need nebulizer as rescue, start prednisone 20 mg per day until not needing it, then 10 mg per day x 5 days and stop  mucinex dm up to 1200 mg every 12 h and use flutter valve  See calendar for specific medication instructions and bring it back for each and every office visit for every healthcare provider you see.  Without it,  you may not receive the best quality medical care that we feel you deserve.  You will note that the calendar groups together  your maintenance  medications that are timed at particular times of the day.  Think of this as your checklist for what your doctor has instructed you to do until your next evaluation to see what benefit  there is  to staying on a consistent group of medications intended to keep you well.  The other group at the bottom is entirely up to you to use as you see fit  for specific symptoms that may arise between visits that require you to treat them on an as needed basis.  Think of this as your action plan or "what if" list.   Separating the top medications from the bottom group is fundamental to providing you adequate care going forward.    Please remember to go to the xray  department downstairs for your tests - we will call you with the results when they are available.    Please schedule a follow up office visit in 6 weeks, call sooner if needed to see Tammy with med calendar and all meds and neb solutions in hand

## 2013-12-11 NOTE — Assessment & Plan Note (Signed)
-   HFA 75% p coaching 03/01/12 > 90% 01/23/2013    - PFT's 12/02/2011  FEV1  0.94 (24%) ratio 39 -  18% response to B 2 and DLCO 60%    -Alpha 1 genotype sent 03/01/12 >  MM  -07/14/2013 Med calendar , 10/24/2013  - self titration of steroids with ceiling of 20/ floor of zero rec 11/07/13 > did not follow action pla  The goal with a chronic steroid dependent illness is always arriving at the lowest effective dose that controls the disease/symptoms and not accepting a set "formula" which is based on statistics or guidelines that don't always take into account patient  variability or the natural hx of the dz in every individual patient, which may well vary over time.  For now therefore I recommend the patient maintain  20 mg per day for flares just until the neb use resolves then taper off    Each maintenance medication was reviewed in detail including most importantly the difference between maintenance and as needed and under what circumstances the prns are to be used. This was done in the context of a medication calendar review which provided the patient with a user-friendly unambiguous mechanism for medication administration and reconciliation and provides an action plan for all active problems. It is critical that this be shown to every doctor  for modification during the office visit if necessary so the patient can use it as a working document.

## 2013-12-11 NOTE — Progress Notes (Signed)
Quick Note:  LMTCB ______ 

## 2013-12-11 NOTE — Telephone Encounter (Signed)
Called and spoke with pt. He is scheduled to come in and see MW this afternoon for acute visit. Nothing further needed

## 2013-12-11 NOTE — Progress Notes (Signed)
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 Minimally better from albuterol.  Much better resp symptoms p prednisone for cluster ha, def improvement in head congestion, also less cough with prednsione.   cc having more cough at hs > white mucus, freq exac in am's, better p coffeee and gets around to taking am meds only an hour or two p rising. rec Stop foradil Start symbicort 160  Take 2 puffs first thing in am and then another 2 puffs about 12 hours later.  Work on inhaler technique:  .    Prednisone 10 mg take  4 each am x 2 days,   2 each am x 2 days,  1 each am x2days and stop Please schedule a follow up office visit in 4 weeks, sooner if needed with pft's   12/02/2011 f/u ov/Riaz Onorato cc better on prednisone then some worse off it but overall  Better vs baseline doe. No cough rec Dulera 200 Take 2 puffs first thing in am and then another 2 puffs about 12 hours later and if you like it fill the prescription  Continue spiriva each am only - take this immediately after dulera  Work on [employer] perfect inhaler technique      02/19/2012  post hospital followup. Patient was admitted March 27 of 02/06/2012 for a COPD exacerbation. He was treated with IV antibiotics, nebulized bronchodilators, IV steroids. He was discharged on doxycycline and a steroid taper. Chest x-ray showed COPD changes without any acute process. He was changed off his Spiriva to Atrovent nebulizer. He was restarted on Dulera  2 puffs twice daily.  Since discharge. Patient  feels improved w/ decreased cough and congestion .  He has not smoked x 4 weeks. He has finished all his antibiotics and steroids. He will not be able to use atrovent neb due to work schedule.  rec Restart Spiriva  1 puff daily  Stop Atrovent NEB  For Rescue USE ONLY::Try to rest first then if not improved may go to inhaler or neb  May use Xopenex Inhaler 2 puffs every 4 hrs As needed  Wheezing-/trouble breathing - this is your rescue inhaler. -away from home  May use Albuterol NEB every 4 hr as needed for wheezing , trouble breathing - at home  Continue on Dulera 2 puffs Twice daily     03/01/2012 f/u ov/[name] cc breathing tends worse daily (vs on prednisone) toward end of shift (2pm to 830 pm).  Wakes up each am with rattling producing nothing @  Nl wake up time and not much albuterol use daytime in any form.  Prev aecopd ? Due to change to foradil instead of maintaining dulera and increased cig use which continues  "no more than 1-2 per week" rec Prednisone 10 mg take  4 each am x 2 days,   2 each am x 2 days,  1 each am x2days and stop  Stop smoking completely before smoking completely stops you! Only use your albuterol (plan B xopenex, Plan  C is nebulizer) as a rescue medication o be used up to every 4 hours if needed  Work on [employer]  inhaler technique   04/20/2012 f/u ov/[name] last cigarette 02/2012 overall no change doe and cough, no purulent sputum rec Congratulations on not smoking - it's the most important aspect of your care  For cough > mucinex 600 up to  2 every 12 hours For breathing xopenex hfa(your inhaler) up to 2 puffs every to 4 hours and only use the nebulizer if can't catch your breathing after the inhaler   04/25/2013 f/u ov/[name] re copd Chief Complaint  Patient presents with  . Follow-up    Breathing some worse with hot/humid weather. No other new co's today.    Mowed front yard self propelled without stopping and then had to stop every few minutes  on back yard  but still  rarely using saba  rec Dulera and Spiriva are Plan A = automatic =  take these "no matter what" Only use your albuterol (plan B Proaire/  Plan C is the nebulizer)  as   rescue medication to be used if you can't catch your breath by resting or doing a relaxed purse lip breathing pattern. The less you use it, the better it will work when you need it. Ok to use up to every 4 hours if needed  For cough and congestion > mucinex up to 1200 mg every 12 hours.  06/13/2013 acute  ov/[name] re ? [name] Chief Complaint  Patient presents with  . Acute Visit    increased SOB, rattling in chest, cough - prod in the early mornings.  Fever up to  100.8 in the evenings.    woke up 8/3  Feeling bad with purulent sputm then febrile pm 8/3 and 8/4 no rigors, min increase in saba use just as hfa, not neb. >zpack / pred   06/23/13 Acute OV  pt reports breathing had improved until 8/14  he woke up with SOB, prod cough.  still having some DOE, prod cough with white foamy mucus, chest tightness, wheezing.  CXR> bilateral LL opacities L>R  Levaquin 750mg  daily for 7 days Mucinex DM Twice daily  As needed  Cough/congestion  Prednisone taper over next week  06/28/2013 f/u ov/[name] f/u ? pna Chief Complaint  Patient presents with  . 1 Week ROV    Reports severe coughing, chest tightness, clear mucus production. Had CXR this morning.  fever gone, cough still severe > clear mucus better than it was Breathing better 8/18 then worse since with increases need for saba but no rsting sob  >>Pred taper   07/14/2013 Follow up and med review  Pt returns for follow up for PNA and med review  We reviewed all his medications and organized them into a med calendar  With pt education  Appears to be taking meds correctly.  Was treated for a LLL PNA , has finished 7 days of Levaquin 750mg . Feeling better with less cough and wheezing .  No fever, hemoptysis, chest pain or orthopnea.   CXR > COPD. Improving but persistent  density in the left lung base. Recommend continued followup  10/02/13 [name] aecopd x sev days  rec Prednisone 10 mg -Take 4 tabs  daily with food x 4 days, then 3 tabs daily x 4 days, then 2 tabs daily x 4 days, then 1 tab daily x4 days then stop. #40 Stay on dulera & spiriva Albuterol nebs thrice daily as needed   10/04/13  Augmentin 875 mg take one pill twice daily  X 10 days - take at breakfast and supper with large glass of water.   Albuterol neb up to every3 hours and if not comforable at rest p neb go to ER     10/09/2013 f/u ov/Romeka Scifres re: aecopd/ no med calendar/ confused with action plan Chief Complaint  Patient presents with  . Acute Visit    Pt c/o increased SOB, chest tightness and chest congestion x 1 wk. He states gets out of breath just standing up or walking a few steps.   Ok at rest RA Last neb 3  h prior to OV . No purulent secretions on augmentin since 11/26 Mint def makes breathing worse >>pred taper   10/24/2013 Follow up and Med review  Patient returns for a two-week followup and medication review. We reviewed all his medications and organized them into a medication calendar with patient education. Patient appears to be taking his medications correctly. Patient has been having a slow to resolve COPD, exacerbation. He was treated with Augmentin  Steroid taper. 3 weeks ago. Patient reports symptoms only minimally improved. He is currently on prednisone 20 mg. Patient was also called in a Z-Pak 10 days ago. Patient reports he did not have any improvement with Z-Pak. He continues to have sinus congestion, drainage, productive cough with rattling mucus. Patient is using Mucinex DM and flutter valve without any help.  Patient reports that he takes his Dulera  and Spiriva on a consistent basis without any missed doses. Says his wheezing and cough worse in evening.  CT chest done last week showed scattered small subpleural nodules including some in  the right upper lobe  which have a somewhat tree-in-bud distribution, suggesting a postinflammatory process. No highly suspicious nodules rec Levaquin 500mg  daily for 7 days  Mucinex DM Twice daily  As needed  Cough/congestion  Continue on Prednisone 20mg  daily until seen back in office   11/07/2013 f/u ov/Phinneas Shakoor re: aecopd still not back to baseline/ never went to ER Chief Complaint  Patient presents with  . Follow-up    Cough has improved some- still coughing up minimal white sputum.  SOB much better but not quite back at his normal baseline.   walk x sev hundred feet  Still some congested cough pred still at 20 mg per day  Much less saba dep and no longer needing neb form, just the hfa  rec As per med calendar, when need nebulizer as rescue, take prednisone 20 mg per day until no need, then 10 mg per day x 5 d and stop See calendar for specific medication   12/11/2013 f/u ov/Reynard Christoffersen re: ran out of prednisone x sev weeks prior to OV and not compliant with gerd rx Chief Complaint  Patient presents with  . Acute Visit    Pt c/o increased SOB x 6 days- with nausea and vomiting at onset. He also has increased prod cough with white sputum. He has used x 2 in the past wk and is using rescue inhaler at least once per day.   used neb avg once daily since onset of worse breathing  But did not activate the prednisone action plan No discolored mucus  No med calendar    No obvious day to day or daytime variabilty or assoc cp or chest tightness, subjective wheeze overt sinus or hb symptoms. No unusual exp hx or h/o childhood pna/ asthma or knowledge of premature birth.  Sleeping ok without nocturnal  or  early am exacerbation  of respiratory  c/o's or need for noct saba. Also denies any obvious fluctuation of symptoms with weather or environmental changes or other aggravating or alleviating factors except as outlined above   Current Medications, Allergies, Complete Past Medical History, Past Surgical History, Family  History, and Social History were reviewed in [facility] electronic medical record.  ROS  The following are not active complaints unless bolded sore throat, dysphagia, dental problems, itching, sneezing,  nasal congestion or excess/ purulent secretions, ear ache,   fever, chills, sweats, unintended wt loss, pleuritic or exertional cp, hemoptysis,  orthopnea pnd or leg swelling, presyncope, palpitations, heartburn, abdominal pain, anorexia, nausea, vomiting, diarrhea  or change in bowel or urinary habits, change in stools or urine, dysuria,hematuria,  rash, arthralgias, visual complaints, headache, numbness weakness or ataxia or problems with walking or coordination,  change in mood/affect or memory.                           Objective:        Wt 188 01/09/12 >  07/27/2012  194 > 196 10/24/2012 > 01/23/2013  202> 200 04/25/2013 > 194 06/13/2013 >164 8/15 > 06/28/2013  191 >199 07/14/2013  >208 10/24/2013 > 11/07/2013  214 > 12/11/2013 208     HEENT mild turbinate edema.  Oropharynx no thrush or excess pnd or cobblestoning.  No JVD or cervical adenopathy. Mild accessory muscle hypertrophy. Trachea midline, nl thryroid.  Exp wheezing with coarse rhonchi  Regular rate and rhythm without murmur gallop or rub or increase P2 or edema.  Abd: no hsm, nl excursion. Ext warm without cyanosis or clubbing.        CXR  12/11/2013 :   1. Emphysematous change. No acute findings.  2. Stable apical nodular pleural thickening     Assessment & Plan:

## 2013-12-12 NOTE — Progress Notes (Signed)
Quick Note:  Spoke with pt and notified of results per Dr. Wert. Pt verbalized understanding and denied any questions.  ______ 

## 2013-12-19 ENCOUNTER — Ambulatory Visit: Payer: BC Managed Care – PPO | Admitting: Internal Medicine

## 2014-01-05 ENCOUNTER — Other Ambulatory Visit: Payer: Self-pay | Admitting: Internal Medicine

## 2014-01-05 ENCOUNTER — Ambulatory Visit (INDEPENDENT_AMBULATORY_CARE_PROVIDER_SITE_OTHER): Payer: BC Managed Care – PPO | Admitting: Adult Health

## 2014-01-05 ENCOUNTER — Encounter: Payer: Self-pay | Admitting: Adult Health

## 2014-01-05 VITALS — BP 132/74 | HR 98 | Temp 99.8°F

## 2014-01-05 DIAGNOSIS — J441 Chronic obstructive pulmonary disease with (acute) exacerbation: Secondary | ICD-10-CM

## 2014-01-05 MED ORDER — ALPRAZOLAM 0.25 MG PO TABS: 0.2500 mg | ORAL_TABLET | Freq: Every day | ORAL | Status: DC | PRN

## 2014-01-05 MED ORDER — PREDNISONE 10 MG PO TABS: ORAL_TABLET | ORAL | Status: DC

## 2014-01-05 MED ORDER — DOXYCYCLINE HYCLATE 100 MG PO TABS: 100.0000 mg | ORAL_TABLET | Freq: Two times a day (BID) | ORAL | Status: DC

## 2014-01-05 NOTE — Assessment & Plan Note (Signed)
COPD Flare   Plan  Doxycyline 100mg  Twice daily  For 7 days  Mucinex DM Twice daily  As needed  Cough/congestion  Prednisone 40mg  daily for 4 days, then 30mg  daily for 4 days then back to 20mg  tapering dose according to med calendar.  Xanax 0.25mg  daily As needed  Anxiety . May make you sleepy.  Follow up in 2 -3 weeks as planned  and As needed   Fluids and rest  Tylenol As needed  Fever.  NO NSAIDS -advil, ibuprofen , aleve, etc.  Please contact office for sooner follow up if symptoms do not improve or worsen or seek emergency care

## 2014-01-05 NOTE — Progress Notes (Signed)
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 Minimally better from albuterol.  Much better resp symptoms p prednisone for cluster ha, def improvement in head congestion, also less cough with prednsione.   cc having more cough at hs > white mucus, freq exac in am's, better p coffeee and gets around to taking am meds only an hour or two p rising. rec Stop foradil Start symbicort 160  Take 2 puffs first thing in am and then another 2 puffs about 12 hours later.  Work on inhaler technique:  .    Prednisone 10 mg take  4 each am x 2 days,   2 each am x 2 days,  1 each am x2days and stop Please schedule a follow up office visit in 4 weeks, sooner if needed with pft's   12/02/2011 f/u ov/Wert cc better on prednisone then some worse off it but overall  Better vs baseline doe. No cough rec Dulera 200 Take 2 puffs first thing in am and then another 2 puffs about 12 hours later and if you like it fill the prescription  Continue spiriva each am only - take this immediately after dulera  Work on [employer] perfect inhaler technique      02/19/2012  post hospital followup. Patient was admitted March 27 of 02/06/2012 for a COPD exacerbation. He was treated with IV antibiotics, nebulized bronchodilators, IV steroids. He was discharged on doxycycline and a steroid taper. Chest x-ray showed COPD changes without any acute process. He was changed off his Spiriva to Atrovent nebulizer. He was restarted on Dulera  2 puffs twice daily.  Since discharge. Patient  feels improved w/ decreased cough and congestion .  He has not smoked x 4 weeks. He has finished all his antibiotics and steroids. He will not be able to use atrovent neb due to work schedule.  rec Restart Spiriva  1 puff daily  Stop Atrovent NEB  For Rescue USE ONLY::Try to rest first then if not improved may go to inhaler or neb  May use Xopenex Inhaler 2 puffs every 4 hrs As needed  Wheezing-/trouble breathing - this is your rescue inhaler. -away from home  May use Albuterol NEB every 4 hr as needed for wheezing , trouble breathing - at home  Continue on Dulera 2 puffs Twice daily     03/01/2012 f/u ov/Wert cc breathing tends worse daily (vs on prednisone) toward end of shift (2pm to 830 pm).  Wakes up each am with rattling producing nothing @  Nl wake up time and not much albuterol use daytime in any form.  Prev aecopd ? Due to change to foradil instead of maintaining dulera and increased cig use which continues  "no more than 1-2 per week" rec Prednisone 10 mg take  4 each am x 2 days,   2 each am x 2 days,  1 each am x2days and stop  Stop smoking completely before smoking completely stops you! Only use your albuterol (plan B xopenex, Plan  C is nebulizer) as a rescue medication o be used up to every 4 hours if needed  Work on [employer]  inhaler technique   04/20/2012 f/u ov/Wert last cigarette 02/2012 overall no change doe and cough, no purulent sputum rec Congratulations on not smoking - it's the most important aspect of your care  For cough > mucinex 600 up to  2 every 12 hours For breathing xopenex hfa(your inhaler) up to 2 puffs every to 4 hours and only use the nebulizer if can't catch your breathing after the inhaler   04/25/2013 f/u ov/Wert re copd Chief Complaint  Patient presents with  . Follow-up    Breathing some worse with hot/humid weather. No other new co's today.    Mowed front yard self propelled without stopping and then had to stop every few minutes  on back yard  but still  rarely using saba  rec Dulera and Spiriva are Plan A = automatic =  take these "no matter what" Only use your albuterol (plan B Proaire/  Plan C is the nebulizer)  as   rescue medication to be used if you can't catch your breath by resting or doing a relaxed purse lip breathing pattern. The less you use it, the better it will work when you need it. Ok to use up to every 4 hours if needed  For cough and congestion > mucinex up to 1200 mg every 12 hours.  06/13/2013 acute  ov/Wert re ? [name] Chief Complaint  Patient presents with  . Acute Visit    increased SOB, rattling in chest, cough - prod in the early mornings.  Fever up to  100.8 in the evenings.    woke up 8/3  Feeling bad with purulent sputm then febrile pm 8/3 and 8/4 no rigors, min increase in saba use just as hfa, not neb. >zpack / pred   06/23/13 Acute OV  pt reports breathing had improved until 8/14  he woke up with SOB, prod cough.  still having some DOE, prod cough with white foamy mucus, chest tightness, wheezing.  CXR> bilateral LL opacities L>R  Levaquin 750mg  daily for 7 days Mucinex DM Twice daily  As needed  Cough/congestion  Prednisone taper over next week  06/28/2013 f/u ov/Wert f/u ? pna Chief Complaint  Patient presents with  . 1 Week ROV    Reports severe coughing, chest tightness, clear mucus production. Had CXR this morning.  fever gone, cough still severe > clear mucus better than it was Breathing better 8/18 then worse since with increases need for saba but no rsting sob  >>Pred taper   07/14/2013 Follow up and med review  Pt returns for follow up for PNA and med review  We reviewed all his medications and organized them into a med calendar  With pt education  Appears to be taking meds correctly.  Was treated for a LLL PNA , has finished 7 days of Levaquin 750mg . Feeling better with less cough and wheezing .  No fever, hemoptysis, chest pain or orthopnea.   CXR > COPD. Improving but persistent  density in the left lung base. Recommend continued followup  10/02/13 [name] aecopd x sev days  rec Prednisone 10 mg -Take 4 tabs  daily with food x 4 days, then 3 tabs daily x 4 days, then 2 tabs daily x 4 days, then 1 tab daily x4 days then stop. #40 Stay on dulera & spiriva Albuterol nebs thrice daily as needed   10/04/13  Augmentin 875 mg take one pill twice daily  X 10 days - take at breakfast and supper with large glass of water.   Albuterol neb up to every3 hours and if not comforable at rest p neb go to ER     10/09/2013 f/u ov/Wert re: aecopd/ no med calendar/ confused with action plan Chief Complaint  Patient presents with  . Acute Visit    Pt c/o increased SOB, chest tightness and chest congestion x 1 wk. He states gets out of breath just standing up or walking a few steps.   Ok at rest RA Last neb 3  h prior to OV . No purulent secretions on augmentin since 11/26 Mint def makes breathing worse >>pred taper   10/24/2013 Follow up and Med review  Patient returns for a two-week followup and medication review. We reviewed all his medications and organized them into a medication calendar with patient education. Patient appears to be taking his medications correctly. Patient has been having a slow to resolve COPD, exacerbation. He was treated with Augmentin  Steroid taper. 3 weeks ago. Patient reports symptoms only minimally improved. He is currently on prednisone 20 mg. Patient was also called in a Z-Pak 10 days ago. Patient reports he did not have any improvement with Z-Pak. He continues to have sinus congestion, drainage, productive cough with rattling mucus. Patient is using Mucinex DM and flutter valve without any help.  Patient reports that he takes his Dulera  and Spiriva on a consistent basis without any missed doses. Says his wheezing and cough worse in evening.  CT chest done last week showed scattered small subpleural nodules including some in  the right upper lobe  which have a somewhat tree-in-bud distribution, suggesting a postinflammatory process. No highly suspicious nodules rec Levaquin 500mg  daily for 7 days  Mucinex DM Twice daily  As needed  Cough/congestion  Continue on Prednisone 20mg  daily until seen back in office   11/07/2013 f/u ov/Wert re: aecopd still not back to baseline/ never went to ER Chief Complaint  Patient presents with  . Follow-up    Cough has improved some- still coughing up minimal white sputum.  SOB much better but not quite back at his normal baseline.   walk x sev hundred feet  Still some congested cough pred still at 20 mg per day  Much less saba dep and no longer needing neb form, just the hfa  rec As per med calendar, when need nebulizer as rescue, take prednisone 20 mg per day until no need, then 10 mg per day x 5 d and stop See calendar for specific medication   12/11/2013 f/u ov/Wert re: ran out of prednisone x sev weeks prior to OV and not compliant with gerd rx Chief Complaint  Patient presents with  . Acute Visit    Pt c/o increased SOB x 6 days- with nausea and vomiting at onset. He also has increased prod cough with white sputum. He has used x 2 in the past wk and is using rescue inhaler at least once per day.   used neb avg once daily since onset of worse breathing  But did not activate the prednisone action plan No discolored mucus  No med calendar  >>pred taper , CXR w/ no acute process.   01/05/2014 Acute OV  Complains of  increased SOB, wheezing, chest tightness, prod cough with white mucus x3days.  Denies hemoptysis, nausea, vomiting, edema, f/c/s. , body aches , or orthopnea.  Started taking  20mg  prednisone few days ago, started steroids x 3 weeks ago ,tried to taper down but symptoms worsen on lower doses of prednisone.  Complains of increased anxiety and panic attacks. Has been using albuterol nebulizer with some relief.     Current Medications, Allergies, Complete Past Medical History,  Past Surgical History, Family History, and Social History were reviewed in [employer] record.  ROS  The following are not active complaints unless bolded sore throat, dysphagia, dental problems, itching, sneezing,  nasal congestion or excess/ purulent secretions, ear ache,   fever, chills, sweats, unintended wt loss, pleuritic or exertional cp, hemoptysis,  orthopnea pnd or leg swelling, presyncope, palpitations, heartburn, abdominal pain, anorexia, nausea, vomiting, diarrhea  or change in bowel or urinary habits, change in stools or urine, dysuria,hematuria,  rash, arthralgias, visual complaints, headache, numbness weakness or ataxia or problems with walking or coordination,  change in mood/affect or memory.                           Objective:        Wt 188 01/09/12 >  07/27/2012  194 > 196 10/24/2012 > 01/23/2013  202> 200 04/25/2013 > 194 06/13/2013 >164 8/15 > 06/28/2013  191 >199 07/14/2013  >208 10/24/2013 > 11/07/2013  214 > 12/11/2013 208 >? 01/05/2014     HEENT mild turbinate edema.  Oropharynx no thrush or excess pnd or cobblestoning.  No JVD or cervical adenopathy. Mild accessory muscle hypertrophy. Trachea midline, nl thryroid.  Faint Exp wheezing , no rhonchi, or crackles   Regular rate and rhythm without murmur gallop or rub or increase P2 or edema.  Abd: no hsm, nl excursion. Ext warm without cyanosis or clubbing.        CXR  12/11/2013 :   1. Emphysematous change. No acute findings.  2. Stable apical nodular pleural thickening     Assessment & Plan:

## 2014-01-05 NOTE — Patient Instructions (Addendum)
Doxycyline 100mg  Twice daily  For 7 days  Mucinex DM Twice daily  As needed  Cough/congestion  Prednisone 40mg  daily for 4 days, then 30mg  daily for 4 days then back to 20mg  tapering dose according to med calendar.  Xanax 0.25mg  daily As needed  Anxiety . May make you sleepy.  Follow up in 2 -3 weeks as planned  and As needed   Fluids and rest  Tylenol As needed  Fever.  NO NSAIDS -advil, ibuprofen , aleve, etc.  Please contact office for sooner follow up if symptoms do not improve or worsen or seek emergency care

## 2014-01-07 DIAGNOSIS — I214 Non-ST elevation (NSTEMI) myocardial infarction: Secondary | ICD-10-CM

## 2014-01-07 HISTORY — DX: Non-ST elevation (NSTEMI) myocardial infarction: I21.4

## 2014-01-10 ENCOUNTER — Emergency Department (HOSPITAL_COMMUNITY): Payer: BC Managed Care – PPO

## 2014-01-10 ENCOUNTER — Inpatient Hospital Stay (HOSPITAL_COMMUNITY)
Admission: EM | Admit: 2014-01-10 | Discharge: 2014-01-13 | DRG: 193 | Disposition: A | Discharge status: Home Health | Payer: BC Managed Care – PPO | Attending: Internal Medicine | Admitting: Internal Medicine

## 2014-01-10 ENCOUNTER — Encounter (HOSPITAL_COMMUNITY): Payer: Self-pay | Admitting: Emergency Medicine

## 2014-01-10 DIAGNOSIS — R0609 Other forms of dyspnea: Secondary | ICD-10-CM

## 2014-01-10 DIAGNOSIS — I2609 Other pulmonary embolism with acute cor pulmonale: Secondary | ICD-10-CM

## 2014-01-10 DIAGNOSIS — R0989 Other specified symptoms and signs involving the circulatory and respiratory systems: Secondary | ICD-10-CM

## 2014-01-10 DIAGNOSIS — Z806 Family history of leukemia: Secondary | ICD-10-CM

## 2014-01-10 DIAGNOSIS — I10 Essential (primary) hypertension: Secondary | ICD-10-CM | POA: Diagnosis present

## 2014-01-10 DIAGNOSIS — J189 Pneumonia, unspecified organism: Principal | ICD-10-CM | POA: Diagnosis present

## 2014-01-10 DIAGNOSIS — E872 Acidosis, unspecified: Secondary | ICD-10-CM | POA: Diagnosis present

## 2014-01-10 DIAGNOSIS — Z87891 Personal history of nicotine dependence: Secondary | ICD-10-CM

## 2014-01-10 DIAGNOSIS — R778 Other specified abnormalities of plasma proteins: Secondary | ICD-10-CM

## 2014-01-10 DIAGNOSIS — I4510 Unspecified right bundle-branch block: Secondary | ICD-10-CM | POA: Diagnosis present

## 2014-01-10 DIAGNOSIS — J45901 Unspecified asthma with (acute) exacerbation: Secondary | ICD-10-CM

## 2014-01-10 DIAGNOSIS — I214 Non-ST elevation (NSTEMI) myocardial infarction: Secondary | ICD-10-CM

## 2014-01-10 DIAGNOSIS — J441 Chronic obstructive pulmonary disease with (acute) exacerbation: Secondary | ICD-10-CM | POA: Diagnosis present

## 2014-01-10 DIAGNOSIS — R7989 Other specified abnormal findings of blood chemistry: Secondary | ICD-10-CM

## 2014-01-10 DIAGNOSIS — Z825 Family history of asthma and other chronic lower respiratory diseases: Secondary | ICD-10-CM

## 2014-01-10 DIAGNOSIS — I498 Other specified cardiac arrhythmias: Secondary | ICD-10-CM | POA: Diagnosis present

## 2014-01-10 DIAGNOSIS — E119 Type 2 diabetes mellitus without complications: Secondary | ICD-10-CM

## 2014-01-10 DIAGNOSIS — R799 Abnormal finding of blood chemistry, unspecified: Secondary | ICD-10-CM

## 2014-01-10 DIAGNOSIS — Z8249 Family history of ischemic heart disease and other diseases of the circulatory system: Secondary | ICD-10-CM

## 2014-01-10 DIAGNOSIS — Z886 Allergy status to analgesic agent status: Secondary | ICD-10-CM

## 2014-01-10 DIAGNOSIS — J449 Chronic obstructive pulmonary disease, unspecified: Secondary | ICD-10-CM

## 2014-01-10 DIAGNOSIS — R918 Other nonspecific abnormal finding of lung field: Secondary | ICD-10-CM

## 2014-01-10 DIAGNOSIS — R0603 Acute respiratory distress: Secondary | ICD-10-CM

## 2014-01-10 DIAGNOSIS — F17200 Nicotine dependence, unspecified, uncomplicated: Secondary | ICD-10-CM

## 2014-01-10 LAB — I-STAT TROPONIN, ED: Troponin i, poc: 0.12 ng/mL (ref 0.00–0.08)

## 2014-01-10 LAB — CBC WITH DIFFERENTIAL/PLATELET
BASOS ABS: 0.2 10*3/uL — AB (ref 0.0–0.1)
BASOS PCT: 1 % (ref 0–1)
Eosinophils Absolute: 0 10*3/uL (ref 0.0–0.7)
Eosinophils Relative: 0 % (ref 0–5)
HCT: 43.1 % (ref 39.0–52.0)
HEMOGLOBIN: 14.7 g/dL (ref 13.0–17.0)
LYMPHS PCT: 10 % — AB (ref 12–46)
Lymphs Abs: 1.7 10*3/uL (ref 0.7–4.0)
MCH: 31.8 pg (ref 26.0–34.0)
MCHC: 34.1 g/dL (ref 30.0–36.0)
MCV: 93.3 fL (ref 78.0–100.0)
MONO ABS: 0.5 10*3/uL (ref 0.1–1.0)
Monocytes Relative: 3 % (ref 3–12)
Neutro Abs: 14.6 10*3/uL — ABNORMAL HIGH (ref 1.7–7.7)
Neutrophils Relative %: 86 % — ABNORMAL HIGH (ref 43–77)
PLATELETS: 214 10*3/uL (ref 150–400)
RBC: 4.62 MIL/uL (ref 4.22–5.81)
RDW: 14.1 % (ref 11.5–15.5)
WBC: 17 10*3/uL — ABNORMAL HIGH (ref 4.0–10.5)

## 2014-01-10 LAB — COMPREHENSIVE METABOLIC PANEL
ALBUMIN: 3.3 g/dL — AB (ref 3.5–5.2)
ALK PHOS: 59 U/L (ref 39–117)
ALT: 58 U/L — ABNORMAL HIGH (ref 0–53)
AST: 46 U/L — AB (ref 0–37)
BUN: 22 mg/dL (ref 6–23)
CALCIUM: 8.8 mg/dL (ref 8.4–10.5)
CO2: 18 mEq/L — ABNORMAL LOW (ref 19–32)
Chloride: 100 mEq/L (ref 96–112)
Creatinine, Ser: 0.85 mg/dL (ref 0.50–1.35)
GFR calc Af Amer: >90 mL/min (ref >90)
GFR calc non Af Amer: >90 mL/min (ref >90)
GLUCOSE: 343 mg/dL — AB (ref 70–99)
Potassium: 4.7 mEq/L (ref 3.7–5.3)
SODIUM: 138 meq/L (ref 137–147)
TOTAL PROTEIN: 6.7 g/dL (ref 6.0–8.3)
Total Bilirubin: 0.5 mg/dL (ref 0.3–1.2)

## 2014-01-10 LAB — BLOOD GAS, ARTERIAL
ACID-BASE DEFICIT: 3.7 mmol/L — AB (ref 0.0–2.0)
Bicarbonate: 20.1 mEq/L (ref 20.0–24.0)
DRAWN BY: 257701
O2 Content: 7 L/min
O2 Saturation: 95.6 %
PH ART: 7.382 (ref 7.350–7.450)
PO2 ART: 81.9 mmHg (ref 80.0–100.0)
Patient temperature: 98.6
TCO2: 17.6 mmol/L (ref 0–100)
pCO2 arterial: 34.7 mmHg — ABNORMAL LOW (ref 35.0–45.0)

## 2014-01-10 LAB — GLUCOSE, CAPILLARY: GLUCOSE-CAPILLARY: 152 mg/dL — AB (ref 70–99)

## 2014-01-10 LAB — CBG MONITORING, ED: GLUCOSE-CAPILLARY: 297 mg/dL — AB (ref 70–99)

## 2014-01-10 LAB — PRO B NATRIURETIC PEPTIDE: Pro B Natriuretic peptide (BNP): 182.7 pg/mL — ABNORMAL HIGH (ref 0–125)

## 2014-01-10 LAB — MRSA PCR SCREENING: MRSA by PCR: NEGATIVE

## 2014-01-10 LAB — TROPONIN I
TROPONIN I: 1 ng/mL — AB (ref <0.30)
Troponin I: 0.38 ng/mL (ref <0.30)

## 2014-01-10 MED ORDER — ALPRAZOLAM 0.25 MG PO TABS
0.2500 mg | ORAL_TABLET | Freq: Every day | ORAL | Status: DC | PRN
  Administered 2014-01-11: 0.25 mg via ORAL
  Filled 2014-01-10: qty 1

## 2014-01-10 MED ORDER — METHYLPREDNISOLONE SODIUM SUCC 125 MG IJ SOLR
60.0000 mg | Freq: Four times a day (QID) | INTRAMUSCULAR | Status: DC
  Administered 2014-01-10 – 2014-01-12 (×8): 60 mg via INTRAVENOUS
  Filled 2014-01-10 (×11): qty 0.96
  Filled 2014-01-10: qty 2

## 2014-01-10 MED ORDER — MORPHINE SULFATE 2 MG/ML IJ SOLN: 2.0000 mg | INTRAMUSCULAR | Status: DC | PRN

## 2014-01-10 MED ORDER — IPRATROPIUM-ALBUTEROL 0.5-2.5 (3) MG/3ML IN SOLN
3.0000 mL | RESPIRATORY_TRACT | Status: DC
  Administered 2014-01-10 – 2014-01-12 (×14): 3 mL via RESPIRATORY_TRACT
  Filled 2014-01-10 (×15): qty 3

## 2014-01-10 MED ORDER — ONDANSETRON HCL 4 MG PO TABS: 4.0000 mg | ORAL_TABLET | Freq: Four times a day (QID) | ORAL | Status: DC | PRN

## 2014-01-10 MED ORDER — DEXTROSE 5 % IV SOLN
1.0000 g | INTRAVENOUS | Status: DC
  Administered 2014-01-10 – 2014-01-12 (×3): 1 g via INTRAVENOUS
  Filled 2014-01-10 (×5): qty 10

## 2014-01-10 MED ORDER — SODIUM CHLORIDE 0.9 % IJ SOLN
3.0000 mL | Freq: Two times a day (BID) | INTRAMUSCULAR | Status: DC
  Administered 2014-01-10 – 2014-01-13 (×6): 3 mL via INTRAVENOUS

## 2014-01-10 MED ORDER — INSULIN ASPART 100 UNIT/ML ~~LOC~~ SOLN
0.0000 [IU] | Freq: Every day | SUBCUTANEOUS | Status: DC
Start: 2014-01-10 — End: 2014-01-13
  Administered 2014-01-10: 2 [IU] via SUBCUTANEOUS

## 2014-01-10 MED ORDER — FLUTICASONE PROPIONATE 50 MCG/ACT NA SUSP
2.0000 | Freq: Every morning | NASAL | Status: DC
  Administered 2014-01-11 – 2014-01-13 (×3): 2 via NASAL
  Filled 2014-01-10: qty 16

## 2014-01-10 MED ORDER — PANTOPRAZOLE SODIUM 40 MG PO TBEC
40.0000 mg | DELAYED_RELEASE_TABLET | Freq: Every day | ORAL | Status: DC
  Administered 2014-01-11 – 2014-01-13 (×3): 40 mg via ORAL
  Filled 2014-01-10 (×5): qty 1

## 2014-01-10 MED ORDER — ALBUTEROL (5 MG/ML) CONTINUOUS INHALATION SOLN
10.0000 mg/h | INHALATION_SOLUTION | RESPIRATORY_TRACT | Status: DC
  Administered 2014-01-10: 10 mg/h via RESPIRATORY_TRACT
  Filled 2014-01-10: qty 20

## 2014-01-10 MED ORDER — ONDANSETRON HCL 4 MG/2ML IJ SOLN: 4.0000 mg | Freq: Four times a day (QID) | INTRAMUSCULAR | Status: DC | PRN

## 2014-01-10 MED ORDER — HEPARIN SODIUM (PORCINE) 5000 UNIT/ML IJ SOLN
5000.0000 [IU] | Freq: Three times a day (TID) | INTRAMUSCULAR | Status: DC
  Administered 2014-01-10 – 2014-01-13 (×7): 5000 [IU] via SUBCUTANEOUS
  Filled 2014-01-10 (×11): qty 1

## 2014-01-10 MED ORDER — SODIUM CHLORIDE 0.9 % IV BOLUS (SEPSIS)
1000.0000 mL | Freq: Once | INTRAVENOUS | Status: AC
  Administered 2014-01-10: 1000 mL via INTRAVENOUS

## 2014-01-10 MED ORDER — ACETAMINOPHEN 325 MG PO TABS
650.0000 mg | ORAL_TABLET | Freq: Four times a day (QID) | ORAL | Status: DC | PRN
  Administered 2014-01-13: 650 mg via ORAL
  Filled 2014-01-10: qty 2

## 2014-01-10 MED ORDER — ASPIRIN 81 MG PO CHEW
81.0000 mg | CHEWABLE_TABLET | Freq: Every day | ORAL | Status: DC
  Administered 2014-01-10 – 2014-01-13 (×4): 81 mg via ORAL
  Filled 2014-01-10 (×4): qty 1

## 2014-01-10 MED ORDER — DEXTROSE 5 % IV SOLN
500.0000 mg | INTRAVENOUS | Status: DC
  Administered 2014-01-10 – 2014-01-12 (×3): 500 mg via INTRAVENOUS
  Filled 2014-01-10 (×4): qty 500

## 2014-01-10 MED ORDER — INSULIN ASPART 100 UNIT/ML ~~LOC~~ SOLN
0.0000 [IU] | Freq: Three times a day (TID) | SUBCUTANEOUS | Status: DC
  Administered 2014-01-10 – 2014-01-11 (×2): 3 [IU] via SUBCUTANEOUS
  Administered 2014-01-11 – 2014-01-12 (×3): 2 [IU] via SUBCUTANEOUS
  Administered 2014-01-12 (×2): 3 [IU] via SUBCUTANEOUS

## 2014-01-10 MED ORDER — IPRATROPIUM-ALBUTEROL 0.5-2.5 (3) MG/3ML IN SOLN: 3.0000 mL | RESPIRATORY_TRACT | Status: DC | PRN

## 2014-01-10 MED ORDER — ACETAMINOPHEN 650 MG RE SUPP: 650.0000 mg | Freq: Four times a day (QID) | RECTAL | Status: DC | PRN

## 2014-01-10 MED ORDER — SODIUM CHLORIDE 0.9 % IV SOLN
INTRAVENOUS | Status: AC
  Administered 2014-01-10: 17:00:00 via INTRAVENOUS

## 2014-01-10 NOTE — Progress Notes (Signed)
Utilization Review completed.  Yalonda Sample RN CM  

## 2014-01-10 NOTE — H&P (Signed)
[facility] History and Physical  Theodore [name] ZOX:[id number] DOB: 1956-06-27 DOA: 01/10/2014  Referring physician: emergency department PCP: No PCP Per Patient  Specialists:   Chief Complaint: sob  HPI: Theodore [name] is a 58 y.o. male  With a hx of copd who presents to the ED with marked sob that started about 2-3 days ago, initially awakening pt from sleep. Pt reports having recent URI type symptoms prior to onset of sx. Pt reported no relief with rescue inhalers or home neb tx. In the ED, pt was noted to have marked wheezing and was given continuous nebs still with wheezing and still requiring O2 via mask. Of note, pt was noted to have a borderline elevated troponin. No chest pain currently. EKG was unremarkable. Hospitalist was consulted for admission.  Review of Systems:  Per above, the remainder of the 10pt ros reviewed and are neg  Past Medical History  Diagnosis Date  . COPD (chronic obstructive pulmonary disease)   . Chronic headache   . Seasonal allergies   . Asthma    Past Surgical History  Procedure Laterality Date  . Nasal septoplasty w/ turbinoplasty  1983   Social History:  reports that he quit smoking about 1 years ago. His smoking use included Cigarettes. He smoked 0.00 packs per day for 35 years. He has never used smokeless tobacco. He reports that he drinks alcohol. He reports that he does not use illicit drugs.  where does patient live--home, ALF, SNF? and with whom if at home?  Can patient participate in ADLs?  Allergies  Allergen Reactions  . Naproxen Sodium Itching    Family History  Problem Relation Age of Onset  . Leukemia Father   . Heart disease Father   . Asthma Sister   . Asthma Father     has asthma as a child  . Emphysema Sister     smoker    (be sure to complete)  Prior to Admission medications   Medication Sig Start Date End Date Taking? Authorizing Provider  albuterol (PROAIR HFA) 108 (90 BASE) MCG/ACT inhaler Inhale 2  puffs into the lungs every 6 (six) hours as needed for wheezing or shortness of breath. 11/07/13  Yes Nyoka CowdenMichael B Wert, MD  albuterol (PROVENTIL) (2.5 MG/3ML) 0.083% nebulizer solution Take 2.5 mg by nebulization every 4 (four) hours as needed for wheezing or shortness of breath (((PLAN B))). DX 496 10/13/13  Yes Nyoka CowdenMichael B Wert, MD  ALPRAZolam Prudy Feeler(XANAX) 0.25 MG tablet Take 1 tablet (0.25 mg total) by mouth daily as needed for anxiety. 01/05/14  Yes Tammy S Parrett, NP  doxycycline (VIBRA-TABS) 100 MG tablet Take 100 mg by mouth 2 (two) times daily. 7 day course started 01/05/14 ends 01/11/14   Yes Historical Provider, MD  famotidine (PEPCID) 20 MG tablet take 1 tablet by mouth at bedtime 01/05/14  Yes Nyoka CowdenMichael B Wert, MD  fluticasone [employer](FLONASE) 50 MCG/ACT nasal spray Place 2 sprays into the nose every morning.    Yes Historical Provider, MD  guaiFENesin (MUCINEX) 600 MG 12 hr tablet Take 600 mg by mouth 2 (two) times daily.   Yes Historical Provider, MD  loratadine (CLARITIN) 10 MG tablet Take 10 mg by mouth daily as needed for allergies.   Yes Historical Provider, MD  mometasone-formoterol (DULERA) 200-5 MCG/ACT AERO Inhale 2 puffs into the lungs 2 (two) times daily.   Yes Historical Provider, MD  nicotine polacrilex (COMMIT) 2 MG lozenge Take 2 mg by mouth as needed for smoking cessation.  Yes Historical Provider, MD  pantoprazole (PROTONIX) 40 MG tablet Take 1 tablet by mouth daily before breakfast. 11/03/13  Yes Historical Provider, MD  predniSONE (DELTASONE) 10 MG tablet Take 10-40 mg by mouth daily. Take 40mg  for 4 days, 30mg  for 4 days, 20mg  for 4 days, 10 mg for 4 days   Yes Historical Provider, MD  sodium chloride (OCEAN) 0.65 % SOLN nasal spray Place 2 sprays into the nose as needed for congestion. 02/06/12  Yes Bernadene Person, NP  SPIRIVA HANDIHALER 18 MCG inhalation capsule inhale the contents of one capsule in the handihaler once daily 11/18/13  Yes Nyoka Cowden, MD  Misc. Devices (ACAPELLA)  MISC Use as directed 06/28/13   Nyoka Cowden, MD  predniSONE (DELTASONE) 10 MG tablet Take  2 until better, then one daily x 5 days and stop 12/11/13   Nyoka Cowden, MD   Physical Exam: Filed Vitals:   01/10/14 1350 01/10/14 1400 01/10/14 1410 01/10/14 1415  BP:  130/93    Pulse: 134 134 135 132  Temp:      Resp: 16 15 16 19   SpO2: 99% 99% 99% 100%     General:  Awake, in nad  Eyes: PERRL B  ENT: membranes moist, dentition fair   Neck: trachea midline, neck supple  Cardiovascular: tachycardic, s1, s2  Respiratory: decreased breath sounds, wheezing throughout  Abdomen: soft, nondistended  Skin: normal skin turgor, no abnormal skin lesions seen  Musculoskeletal: perfused, no clubbing  Psychiatric: mood/affect normal // no auditory/visual hallucinations  Neurologic: cn2-12 grossly intact, strength/sensation intact  Labs on Admission:  Basic Metabolic Panel:  Recent Labs Lab 01/10/14 1255  NA 138  K 4.7  CL 100  CO2 18*  GLUCOSE 343*  BUN 22  CREATININE 0.85  CALCIUM 8.8   Liver Function Tests:  Recent Labs Lab 01/10/14 1255  AST 46*  ALT 58*  ALKPHOS 59  BILITOT 0.5  PROT 6.7  ALBUMIN 3.3*   No results found for this basename: LIPASE, AMYLASE,  in the last 168 hours No results found for this basename: AMMONIA,  in the last 168 hours CBC:  Recent Labs Lab 01/10/14 1255  WBC 17.0*  NEUTROABS 14.6*  HGB 14.7  HCT 43.1  MCV 93.3  PLT 214   Cardiac Enzymes:  Recent Labs Lab 01/10/14 1320  TROPONINI 0.38*    BNP (last 3 results)  Recent Labs  10/24/13 1103 01/10/14 1255  PROBNP 28.0 182.7*   CBG:  Recent Labs Lab 01/10/14 1318  GLUCAP 297*    Radiological Exams on Admission: Dg Chest Port 1 View  01/10/2014   CLINICAL DATA:  History of COPD and asthma now with severe dyspnea and bilateral foot swelling  EXAM: PORTABLE CHEST - 1 VIEW  COMPARISON:  DG CHEST 2 VIEW dated 12/11/2013  FINDINGS: The lungs are hyperinflated. There  is no focal infiltrate. There is no evidence of pulmonary interstitial or alveolar edema. There is no pleural effusion or pneumothorax. The cardiac silhouette is normal in size. The pulmonary vascularity is not engorged. The mediastinum is normal in width. The observed portions of the bony thorax appear normal.  IMPRESSION: There is hyperinflation consistent with COPD. There is no evidence of pneumonia nor CHF or other acute cardiopulmonary disease.   Electronically Signed   By: David  [place]   On: 01/10/2014 13:01    EKG: Independently reviewed. Sinus tach  Assessment/Plan Active Problems:   COPD with acute exacerbation   COPD exacerbation  DM (diabetes mellitus)   1. COPD exacerbation 1. Cont with scheduled nebs as tolerated 2. Will start scheduled IV steroids 3. As pt is still wheezing and is requiring supplimental O2, admit to stepdown 2. Newly diagnosed DM 1. Will check a1c 2. Start on SSI coverage for now 3. Elevated troponin 1. Suspect supply-demand mismatch given hypoxia from above 2. Will cycle cardiac enzymes 3. Will check 2D echo 4. DVT prophylaxis 1. Heparin  Code Status: Full (must indicate code status--if unknown or must be presumed, indicate so) Family Communication: Pt and mother in room (indicate person spoken with, if applicable, with phone number if by telephone) Disposition Plan: Pending (indicate anticipated LOS)  Time spent:  Nicolai Labonte K [facility] Pager [phone]  If 7PM-7AM, please contact night-coverage [email or web address] Password [email or web address] 01/10/2014, 2:54 PM

## 2014-01-10 NOTE — Progress Notes (Signed)
RT completed ABG on 7 lpm aerosal mask (neb tx)= pH 7.38, co 34.7, po2 81.9 (s02 95.6), hc03 20.1.  RT placed PT on CAT (10mg  Albuterol), RT discussed with PT BiPAP usage- PT states he feels he is breathing better and would like to continue with CAT and reassess need for BiPAP. RN aware.

## 2014-01-10 NOTE — ED Notes (Signed)
Pt critical troponin .38 reported to Dr Anitra LauthPlunkett

## 2014-01-10 NOTE — ED Notes (Signed)
Respiratory at bedside.

## 2014-01-10 NOTE — ED Provider Notes (Signed)
CSN: [id number]     Arrival date & time 01/10/14  1229 History   First MD Initiated Contact with Patient 01/10/14 1236     Chief Complaint  Patient presents with  . Shortness of Breath     (Consider location/radiation/quality/duration/timing/severity/associated sxs/prior Treatment) Patient is a 58 y.o. male presenting with shortness of breath. The history is provided by the patient.  Shortness of Breath Severity:  Severe (states over the last 2 weeks has had intermittent worsening sob) Onset quality:  Gradual Duration:  24 hours Timing:  Constant Progression:  Worsening Chronicity:  Recurrent Context: weather changes   Relieved by:  Nothing Worsened by:  Activity Ineffective treatments:  Inhaler Associated symptoms: cough, sputum production and wheezing   Associated symptoms: no abdominal pain, no chest pain, no fever and no vomiting   Risk factors: no recent alcohol use, no recent surgery and no tobacco use   Risk factors comment:  Hx of COPD   Past Medical History  Diagnosis Date  . COPD (chronic obstructive pulmonary disease)   . Chronic headache   . Seasonal allergies   . Asthma    Past Surgical History  Procedure Laterality Date  . Nasal septoplasty w/ turbinoplasty  1983   Family History  Problem Relation Age of Onset  . Leukemia Father   . Heart disease Father   . Asthma Sister   . Asthma Father     has asthma as a child  . Emphysema Sister     smoker   History  Substance Use Topics  . Smoking status: Former Smoker -- 35 years    Types: Cigarettes    Quit date: 01/24/2012  . Smokeless tobacco: Never Used  . Alcohol Use: Yes     Comment: occ    Review of Systems  Constitutional: Negative for fever.  Respiratory: Positive for cough, sputum production, shortness of breath and wheezing.   Cardiovascular: Negative for chest pain.  Gastrointestinal: Negative for vomiting and abdominal pain.  All other systems reviewed and are  negative.      Allergies  Naproxen sodium  Home Medications   Current Outpatient Rx  Name  Route  Sig  Dispense  Refill  . albuterol (PROAIR HFA) 108 (90 BASE) MCG/ACT inhaler   Inhalation   Inhale 2 puffs into the lungs every 6 (six) hours as needed for wheezing or shortness of breath.   1 Inhaler   2   . albuterol (PROVENTIL) (2.5 MG/3ML) 0.083% nebulizer solution   Nebulization   Take 2.5 mg by nebulization every 4 (four) hours as needed for wheezing or shortness of breath (((PLAN B))). DX 496         . ALPRAZolam (XANAX) 0.25 MG tablet   Oral   Take 1 tablet (0.25 mg total) by mouth daily as needed for anxiety.   30 tablet   0   . dextromethorphan-guaiFENesin (MUCINEX DM) 30-600 MG per 12 hr tablet   Oral   Take 1 tablet by mouth every 12 (twelve) hours as needed (with flutter valve).          Marland Kitchen doxycycline (VIBRA-TABS) 100 MG tablet   Oral   Take 1 tablet (100 mg total) by mouth 2 (two) times daily.   14 tablet   0   . DULERA 200-5 MCG/ACT AERO      inhale 2 puffs by mouth FIRST THING IN THE MORNING then inhale 2 puffs by mouth ABOUT 12 HOURS LATER   13 g   11   .  famotidine (PEPCID) 20 MG tablet      take 1 tablet by mouth at bedtime   30 tablet   11   . fluticasone (FLONASE) 50 MCG/ACT nasal spray   Nasal   Place 2 sprays into the nose every morning.          Marland Kitchen ibuprofen (ADVIL,MOTRIN) 200 MG tablet      Per bottle as needed for pain         . Loratadine (CLARITIN) 10 MG CAPS   Oral   Take 1 capsule by mouth daily as needed.         . Misc. Devices (ACAPELLA) MISC      Use as directed   1 each   0   . pantoprazole (PROTONIX) 40 MG tablet      take 1 tablet by mouth daily 30 TO 60 MINUTES BEFORE FIRST MEAL OF THE DAY   30 tablet   11   . pantoprazole (PROTONIX) 40 MG tablet   Oral   Take 1 tablet by mouth daily before breakfast.         . predniSONE (DELTASONE) 10 MG tablet      Take  2 until better, then one daily x 5  days and stop   90 tablet   0   . predniSONE (DELTASONE) 10 MG tablet      4 tabs for 4 days, then 3 tabs for 4 days, then taper as directed.   40 tablet   0   . sodium chloride (OCEAN) 0.65 % SOLN nasal spray   Nasal   Place 2 sprays into the nose as needed for congestion.         Marland Kitchen SPIRIVA HANDIHALER 18 MCG inhalation capsule      inhale the contents of one capsule in the handihaler once daily   30 capsule   11    BP 134/82  Pulse 137  Temp(Src) 98.1 F (36.7 C)  Resp 25  SpO2 94% Physical Exam  Nursing note and vitals reviewed. Constitutional: He is oriented to person, place, and time. He appears well-developed and well-nourished. He appears distressed.  HENT:  Head: Normocephalic and atraumatic.  Mouth/Throat: Oropharynx is clear and moist.  Eyes: Conjunctivae and EOM are normal. Pupils are equal, round, and reactive to light.  Neck: Normal range of motion. Neck supple.  Cardiovascular: Normal rate, regular rhythm and intact distal pulses.   No murmur heard. Pulmonary/Chest: Accessory muscle usage present. Tachypnea noted. No respiratory distress. He has decreased breath sounds. He has wheezes. He has no rales.  Abdominal: Soft. He exhibits no distension. There is no tenderness. There is no rebound and no guarding.  Musculoskeletal: Normal range of motion. He exhibits edema. He exhibits no tenderness.  1+ bilateral lower ext edema  Neurological: He is alert and oriented to person, place, and time.  Skin: Skin is warm and dry. No rash noted. No erythema.  Psychiatric: He has a normal mood and affect. His behavior is normal.    ED Course  Procedures (including critical care time) Labs Review Labs Reviewed  COMPREHENSIVE METABOLIC PANEL - Abnormal; Notable for the following:    CO2 18 (*)    Glucose, Bld 343 (*)    Albumin 3.3 (*)    AST 46 (*)    ALT 58 (*)    All other components within normal limits  BLOOD GAS, ARTERIAL - Abnormal; Notable for the  following:    pCO2 arterial 34.7 (*)  Acid-base deficit 3.7 (*)    All other components within normal limits  I-STAT TROPOININ, ED - Abnormal; Notable for the following:    Troponin i, poc 0.12 (*)    All other components within normal limits  CBG MONITORING, ED - Abnormal; Notable for the following:    Glucose-Capillary 297 (*)    All other components within normal limits  CBC WITH DIFFERENTIAL  PRO B NATRIURETIC PEPTIDE  TROPONIN I   Imaging Review Dg Chest Port 1 View  01/10/2014   CLINICAL DATA:  History of COPD and asthma now with severe dyspnea and bilateral foot swelling  EXAM: PORTABLE CHEST - 1 VIEW  COMPARISON:  DG CHEST 2 VIEW dated 12/11/2013  FINDINGS: The lungs are hyperinflated. There is no focal infiltrate. There is no evidence of pulmonary interstitial or alveolar edema. There is no pleural effusion or pneumothorax. The cardiac silhouette is normal in size. The pulmonary vascularity is not engorged. The mediastinum is normal in width. The observed portions of the bony thorax appear normal.  IMPRESSION: There is hyperinflation consistent with COPD. There is no evidence of pneumonia nor CHF or other acute cardiopulmonary disease.   Electronically Signed   By: David  [place]   On: 01/10/2014 13:01     EKG Interpretation   Date/Time:  Wednesday January 10 2014 12:34:02 EST Ventricular Rate:  140 PR Interval:  140 QRS Duration: 116 QT Interval:  359 QTC Calculation: 548 R Axis:   88 Text Interpretation:  Sinus tachycardia Ventricular premature complex  Incomplete right bundle branch block No previous tracing Confirmed by  Anitra Lauth  MD, Alphonzo Lemmings ([id number]) on 01/10/2014 1:13:09 PM      MDM   Final diagnoses:  COPD exacerbation  Respiratory distress  Elevated troponin   Patient arrives via EMS with respiratory distress. At home patient was satting 89% and breathing 40 times a minute with diffuse wheezing. EMS administered 2 g of magnesium, 125 mg of Solu-Medrol and albuterol  and Atrovent with some improvement. Upon arrival here patient is using accessory muscles satting in the 90s with decreased air movement and wheezing. He denies any chest pain is able to answer questions in short sentences. He does have some mild bilateral lower extremity edema which he states is unchanged from baseline. Low suspicion for PE as patient has no risk factors. He has no prior history of heart disease but does have an extensive history of COPD. He denies ever requiring intubation or BiPAP in the past.  CBC, CMP, BNP, ABG, troponin pending. Chest x-ray pending. Patient started on BiPAP and continuous albuterol treatment.  1:12 PM Troponin with mild elevation of 0.12 will send a regular trop.  Pt on CAT and bipap not started however starting to look more comfortable.  No longer using accessory muscles.  Labs consistent with hyperglycemia and mild metabolic acidosis.  Will admit for further care.  CRITICAL CARE Performed by: Gwyneth Sprout Total critical care time: 30 Critical care time was exclusive of separately billable procedures and treating other patients. Critical care was necessary to treat or prevent imminent or life-threatening deterioration. Critical care was time spent personally by me on the following activities: development of treatment plan with patient and/or surrogate as well as nursing, discussions with consultants, evaluation of patient's response to treatment, examination of patient, obtaining history from patient or surrogate, ordering and performing treatments and interventions, ordering and review of laboratory studies, ordering and review of radiographic studies, pulse oximetry and re-evaluation of patient's condition.   [employer],  MD 01/11/14 [id number]

## 2014-01-10 NOTE — ED Notes (Addendum)
Per EMS patient with Hx of COPD, asthma, saw physician Friday, being treated for URI, today patient experienced severe SOB whilst showering, used home nebulizer and albuterol inhaler. EMS administered 10 mg albuterol, 25 mg atrovent, 2 grams magnesium, 125 mg solu-medrol. Significant edema to feet bilaterally. Profuse wheezing on inspiration and expiration, labored breathing, accessory muscle use.

## 2014-01-10 NOTE — Progress Notes (Signed)
Text page sent informing attending hospitalist MD that the pt's troponin level had risen to 1.00. Will continue to monitor.

## 2014-01-10 NOTE — Consult Note (Signed)
Consulting cardiologist: Dr. Jonelle Sidle  Clinical Summary Theodore [name] is a 58 y.o.male with a history of GOLD IV COPD followed by Dr. Sherene Sires, currently admitted to the hospital with progressive shortness of breath and respiratory failure. He states that over the last several days he has had worsening dyspnea on exertion, intermittent cough recently, failure to improve with rescue inhalers. Symptoms have waxed and waned somewhat, but became particularly worse today prompting hospital evaluation. Patient described as having marked wheezing on evaluation in the ER, treated with continuous nebulizers, now on facemask for support. He denies any chest pain or tightness.  As part of his workup, cardiac markers were obtained. Troponin I level has increased from 0.12 up to 0.38 up to 1.0. ECG shows sinus tachycardia with nonspecific ST-T changes and an incomplete right bundle branch block. Chest x-ray shows no evidence of pulmonary edema or infiltrates at this time.  Theodore [name] denies any known personal history of CAD or myocardial infarction. He was told he had a heart murmur years ago in the [employer]. He has not had any prior cardiac drug store ischemic testing.  Patient reports history of allergy to Naprosyn specifically, not other NSAIDs. States that he is able to take Motrin, also aspirin without difficulty.   Allergies  Allergen Reactions  . Naproxen Sodium Itching    Medications Scheduled Medications: . sodium chloride   Intravenous STAT  . aspirin  81 mg Oral Daily  . azithromycin  500 mg Intravenous Q24H  . cefTRIAXone (ROCEPHIN)  IV  1 g Intravenous Q24H  . [START ON 01/11/2014] fluticasone  2 spray Each Nare q morning - 10a  . heparin  5,000 Units Subcutaneous 3 times per day  . insulin aspart  0-15 Units Subcutaneous TID WC  . insulin aspart  0-5 Units Subcutaneous QHS  . ipratropium-albuterol  3 mL Nebulization Q4H  . methylPREDNISolone (SOLU-MEDROL) injection  60 mg  Intravenous Q6H  . [START ON 01/11/2014] pantoprazole  40 mg Oral QAC breakfast  . sodium chloride  3 mL Intravenous Q12H    Infusions: . albuterol 10 mg/hr (01/10/14 1321)    PRN Medications: acetaminophen, acetaminophen, ALPRAZolam, ipratropium-albuterol, morphine injection, ondansetron (ZOFRAN) IV, ondansetron   Past Medical History  Diagnosis Date  . COPD (chronic obstructive pulmonary disease)   . Chronic headache   . Seasonal allergies   . Asthma     Past Surgical History  Procedure Laterality Date  . Nasal septoplasty w/ turbinoplasty  1983    Family History  Problem Relation Age of Onset  . Leukemia Father   . Heart disease Father   . Asthma Sister   . Asthma Father   . Emphysema Sister     Social History Theodore [name] reports that he quit smoking about 1 years ago. His smoking use included Cigarettes. He smoked 0.00 packs per day for 35 years. He has never used smokeless tobacco. Theodore [name] reports that he drinks alcohol.  Review of Systems As outlined above. No typical exertional chest pain, no palpitations or syncope. No orthopnea or PND. No claudication. Otherwise negative.  Physical Examination Blood pressure 157/96, pulse 120, temperature 96.6 F (35.9 C), temperature source Axillary, resp. rate 14, height 6\' 4"  (1.93 m), weight 207 lb 14.3 oz (94.3 kg), SpO2 95.00%.  Intake/Output Summary (Last 24 hours) at 01/10/14 2010 Last data filed at 01/10/14 2000  Gross per 24 hour  Intake 586.75 ml  Output      0 ml  Net 586.75  ml   Moderately short of breath while talking on facemask. HEENT: Conjunctiva and lids normal, oropharynx clear. Neck: Supple, no elevated JVP or carotid bruits, no thyromegaly. Lungs: Decreased breath sounds with prolonged expiratory phase, moderately labored breathing while talking. Cardiac: Rapid regular rate and rhythm, distant, no obvious S3, soft systolic murmur, no pericardial rub. Abdomen: Soft, nontender, bowel sounds  present, no guarding or rebound. Extremities: No pitting edema, distal pulses 1-2+. Skin: Diaphoretic. Musculoskeletal: No kyphosis. Neuropsychiatric: Alert and oriented x3, affect grossly appropriate.   Lab Results  Basic Metabolic Panel:  Recent Labs Lab 01/10/14 1255  NA 138  K 4.7  CL 100  CO2 18*  GLUCOSE 343*  BUN 22  CREATININE 0.85  CALCIUM 8.8    Liver Function Tests:  Recent Labs Lab 01/10/14 1255  AST 46*  ALT 58*  ALKPHOS 59  BILITOT 0.5  PROT 6.7  ALBUMIN 3.3*    CBC:  Recent Labs Lab 01/10/14 1255  WBC 17.0*  NEUTROABS 14.6*  HGB 14.7  HCT 43.1  MCV 93.3  PLT 214    Cardiac Enzymes:  Recent Labs Lab 01/10/14 1320 01/10/14 1725  TROPONINI 0.38* 1.00*    Pro-BNP: 182  ECG Sinus tachycardia with PVC, incomplete RBBB, NSST changes.  Imaging PORTABLE CHEST - 1 VIEW  COMPARISON: DG CHEST 2 VIEW dated 12/11/2013  FINDINGS: The lungs are hyperinflated. There is no focal infiltrate. There is no evidence of pulmonary interstitial or alveolar edema. There is no pleural effusion or pneumothorax. The cardiac silhouette is normal in size. The pulmonary vascularity is not engorged. The mediastinum is normal in width. The observed portions of the bony thorax appear normal.  IMPRESSION: There is hyperinflation consistent with COPD. There is no evidence of pneumonia nor CHF or other acute cardiopulmonary disease.   Impression  1. NSTEMI, possibly type II event with increased physiologic demand in the setting of COPD exacerbation. He does not endorse any chest pain, but breathing status has been worse off and on for the last several days. ECG shows nonspecific ST-T changes with incomplete right bundle branch block, no pulmonary edema by chest x-ray.  2. GOLD IV COPD, followed by Dr. Sherene SiresWert.  3. Allergy to Naprosyn, reportedly not other NSAIDs or aspirin.  4. Uncertain lipid profile.  Recommendations  Discussed with patient. He is  not reporting any active chest pain now. Continue to cycle cardiac markers. Currently on aspirin and subcutaneous heparin.  No beta blocker with decompensated COPD. Check fasting lipid panel. Obtain complete echocardiogram to assess cardiac structure and function, mainly to exclude cardiomyopathy or focal wall motion abnormalities. He will clearly need to have some type of followup ischemic evaluation, either noninvasive or invasive depending on how he does clinically, his echocardiogram, and his additional cardiac markers trend. Would ideally get his pulmonary status stabilized prior to pursuing ischemic workup Our service will follow with you and make further recommendations.   Jonelle SidleSamuel G. McDowell, M.D., F.A.C.C.

## 2014-01-11 DIAGNOSIS — J189 Pneumonia, unspecified organism: Principal | ICD-10-CM

## 2014-01-11 DIAGNOSIS — I214 Non-ST elevation (NSTEMI) myocardial infarction: Secondary | ICD-10-CM

## 2014-01-11 DIAGNOSIS — I519 Heart disease, unspecified: Secondary | ICD-10-CM

## 2014-01-11 LAB — GLUCOSE, CAPILLARY
GLUCOSE-CAPILLARY: 221 mg/dL — AB (ref 70–99)
GLUCOSE-CAPILLARY: 141 mg/dL — AB (ref 70–99)
GLUCOSE-CAPILLARY: 138 mg/dL — AB (ref 70–99)
Glucose-Capillary: 151 mg/dL — ABNORMAL HIGH (ref 70–99)
Glucose-Capillary: 153 mg/dL — ABNORMAL HIGH (ref 70–99)

## 2014-01-11 LAB — LIPID PANEL
Cholesterol: 178 mg/dL (ref 0–200)
HDL: 81 mg/dL (ref >39)
LDL Cholesterol: 82 mg/dL (ref 0–99)
TRIGLYCERIDES: 75 mg/dL (ref <150)
Total CHOL/HDL Ratio: 2.2 RATIO
VLDL: 15 mg/dL (ref 0–40)

## 2014-01-11 LAB — CBC
HEMATOCRIT: 39.1 % (ref 39.0–52.0)
Hemoglobin: 13.2 g/dL (ref 13.0–17.0)
MCH: 31.7 pg (ref 26.0–34.0)
MCHC: 33.8 g/dL (ref 30.0–36.0)
MCV: 93.8 fL (ref 78.0–100.0)
Platelets: 219 10*3/uL (ref 150–400)
RBC: 4.17 MIL/uL — ABNORMAL LOW (ref 4.22–5.81)
RDW: 14.5 % (ref 11.5–15.5)
WBC: 13.3 10*3/uL — AB (ref 4.0–10.5)

## 2014-01-11 LAB — HEMOGLOBIN A1C
HEMOGLOBIN A1C: 5.9 % — AB (ref <5.7)
MEAN PLASMA GLUCOSE: 123 mg/dL — AB (ref <117)

## 2014-01-11 LAB — COMPREHENSIVE METABOLIC PANEL
ALK PHOS: 54 U/L (ref 39–117)
ALT: 61 U/L — ABNORMAL HIGH (ref 0–53)
AST: 30 U/L (ref 0–37)
Albumin: 3.1 g/dL — ABNORMAL LOW (ref 3.5–5.2)
BUN: 16 mg/dL (ref 6–23)
CO2: 21 mEq/L (ref 19–32)
CREATININE: 0.73 mg/dL (ref 0.50–1.35)
Calcium: 8.9 mg/dL (ref 8.4–10.5)
Chloride: 101 mEq/L (ref 96–112)
GFR calc non Af Amer: >90 mL/min (ref >90)
GLUCOSE: 126 mg/dL — AB (ref 70–99)
Potassium: 4.8 mEq/L (ref 3.7–5.3)
Sodium: 137 mEq/L (ref 137–147)
TOTAL PROTEIN: 6.4 g/dL (ref 6.0–8.3)
Total Bilirubin: 0.3 mg/dL (ref 0.3–1.2)

## 2014-01-11 LAB — LEGIONELLA ANTIGEN, URINE: Legionella Antigen, Urine: NEGATIVE

## 2014-01-11 LAB — D-DIMER, QUANTITATIVE (NOT AT ARMC): D DIMER QUANT: 3.94 ug{FEU}/mL — AB (ref 0.00–0.48)

## 2014-01-11 LAB — TROPONIN I
Troponin I: 1.14 ng/mL (ref <0.30)
Troponin I: 0.99 ng/mL (ref <0.30)

## 2014-01-11 LAB — HIV ANTIBODY (ROUTINE TESTING W REFLEX): HIV: NONREACTIVE

## 2014-01-11 LAB — STREP PNEUMONIAE URINARY ANTIGEN: Strep Pneumo Urinary Antigen: NEGATIVE

## 2014-01-11 MED ORDER — PERFLUTREN LIPID MICROSPHERE
1.0000 mL | INTRAVENOUS | Status: AC | PRN
  Administered 2014-01-11: 10 mL via INTRAVENOUS
  Filled 2014-01-11: qty 10

## 2014-01-11 NOTE — Progress Notes (Signed)
Current vitals HR105, rr 16, BS diminished, Sp02 95%- PT does not appear to be in respiratory distress at this time- BiPAP not indicated- RN aware.

## 2014-01-11 NOTE — Progress Notes (Signed)
ECHOCARDIOGRAM: - HPI and indications: Myocardial infarction. - Left ventricle: The LV was small and compressed by the enlarged RV. Wall thickness was normal. Systolic function was normal. The estimated ejection fraction was in the range of 60% to 65%. Wall motion was normal; there were no regional wall motion abnormalities. Doppler parameters are consistent with abnormal left ventricular relaxation (grade 1 diastolic dysfunction). - Ventricular septum: D-shaped interventricular septum suggesting RV pressure/volume overload. - Aortic valve: There was no stenosis. - Aorta: Mildly dilated aortic root. Aortic root dimension: 40 mm (ED). - Mitral valve: No significant regurgitation. - Right ventricle: The cavity size was severely dilated. Systolic function was moderately reduced. The apex contracted normally but the free was significantly hypokinetic. - Right atrium: The atrium was mildly dilated. - Tricuspid valve: Peak RV-RA gradient: 38mm Hg (S). - Systemic veins: IVC not visualized.  Impression: RV dysfunction is probably related to severe chronic lung disease. If there is any concern that his presentation to be related to PE, perhaps a pulmonary CT angiogram should be performed.  Plan: I will check a d-dimer

## 2014-01-11 NOTE — Progress Notes (Signed)
[facility] PROGRESS NOTE  Theodore [name] [id number] DOB: 02/26/1956 DOA: 01/10/2014 PCP: No PCP Per Patient  Brief narrative: 58 y.o. male with past medical history of COPD GOLD IV who [facility] with worsening shortness of breath especially with exertion for past few days prior to this admission. There was also associated cough and no signficant improvement with home nebulizer treatments. As part of the admission workup, cardiac markers were obtained. Troponin I level has increased from 0.12 up to 0.38 up to 1.0. The 12 lead EKG showed sinus tachycardia with nonspecific ST-T changes and an incomplete right bundle branch block. Chest x-ray showed no evidence of pulmonary edema or infiltrates.  Assessment/Plan:  Principal Problem:   COPD with acute exacerbation - Continue nebulizer treatments, duo nebs every 4 hours scheduled and every 2 hours as needed for shortness of breath or wheezing - Continue oxygen support via nasal cannula or Ventimask to keep oxygen saturation above 90% - Continue Solu-Medrol 60 mg IV every 6 hours - Continue antibiotics for community acquired pneumonia, azithromycin and Rocephin Active problems:   NSTEMI (non-ST elevated myocardial infarction) - Appreciate cardiology following - Followup results of 2-D echo - Troponin trend: 0.12 --> 0.38 --> 1.0 --> 1.14 --> 0.99 - Continue aspirin   Community-acquired pneumonia - Continue azithromycin and Rocephin - Blood cultures are pending - Legionella pending, strep pneumonia negative  Code Status: full code Family Communication: no family at the bedside  Disposition Plan: remains inpatient   Manson PasseyEVINE, Ladonya Jerkins, MD  [facility] Pager [phone]  If 7PM-7AM, please contact night-coverage [email or web address] Password [email or web address] 01/11/2014, 9:42 AM   LOS: 1 day   Consultants:  Cardiology   Procedures:  None   Antibiotics:  Azithromycin 01/10/2014 -->  Rocephin 01/10/2014  ->  HPI/Subjective: No acute overnight events.  Objective: Filed Vitals:   01/11/14 0500 01/11/14 0700 01/11/14 0800 01/11/14 0911  BP: 143/89 144/103 118/92   Pulse: 96 95 106   Temp:      TempSrc:      Resp: 16 18 14    Height:      Weight:      SpO2: 97% 97% 92% 94%    Intake/Output Summary (Last 24 hours) at 01/11/14 0942 Last data filed at 01/11/14 0700  Gross per 24 hour  Intake 1737.25 ml  Output    500 ml  Net 1237.25 ml    Exam:   General:  Pt is alert, follows commands appropriately, not in acute distress  Cardiovascular: Regular rate and rhythm, S1/S2 appreciated   Respiratory: Clear to auscultation bilaterally, no wheezing, no crackles, no rhonchi  Abdomen: Soft, non tender, non distended, bowel sounds present, no guarding  Extremities: No edema, pulses DP and PT palpable bilaterally  Neuro: Grossly nonfocal  Data Reviewed: Basic Metabolic Panel:  Recent Labs Lab 01/10/14 1255 01/11/14 0305  NA 138 137  K 4.7 4.8  CL 100 101  CO2 18* 21  GLUCOSE 343* 126*  BUN 22 16  CREATININE 0.85 0.73  CALCIUM 8.8 8.9   Liver Function Tests:  Recent Labs Lab 01/10/14 1255 01/11/14 0305  AST 46* 30  ALT 58* 61*  ALKPHOS 59 54  BILITOT 0.5 0.3  PROT 6.7 6.4  ALBUMIN 3.3* 3.1*   No results found for this basename: LIPASE, AMYLASE,  in the last 168 hours No results found for this basename: AMMONIA,  in the last 168 hours CBC:  Recent Labs Lab 01/10/14 1255 01/11/14 0305  WBC 17.0* 13.3*  NEUTROABS 14.6*  --   HGB 14.7 13.2  HCT 43.1 39.1  MCV 93.3 93.8  PLT 214 219   Cardiac Enzymes:  Recent Labs Lab 01/10/14 1320 01/10/14 1725 01/10/14 2325 01/11/14 0305  TROPONINI 0.38* 1.00* 1.14* 0.99*   BNP: No components found with this basename: POCBNP,  CBG:  Recent Labs Lab 01/10/14 1318 01/10/14 1719 01/10/14 2203 01/11/14 0741  GLUCAP 297* 152* 221* 141*    MRSA PCR SCREENING     Status: None   Collection Time     01/10/14  4:58 PM      Result Value Ref Range Status   MRSA by PCR NEGATIVE  NEGATIVE Final     Studies: Dg Chest Port 1 View 01/10/2014    IMPRESSION: There is hyperinflation consistent with COPD. There is no evidence of pneumonia nor CHF or other acute cardiopulmonary disease.     Scheduled Meds: . aspirin  81 mg Oral Daily  . azithromycin  500 mg Intravenous Q24H  . cefTRIAXone  1 g Intravenous Q24H  . fluticasone  2 spray Each Nare q morning - 10a  . heparin  5,000 Units Subcutaneous 3 times per day  . insulin aspart  0-15 Units Subcutaneous TID WC  . insulin aspart  0-5 Units Subcutaneous QHS  . ipratropium-albuterol  3 mL Nebulization Q4H  . methylPREDNISolone   60 mg Intravenous Q6H  . pantoprazole  40 mg Oral QAC breakfast

## 2014-01-11 NOTE — Progress Notes (Signed)
       Patient Name: Theodore [name] Date of Encounter: 01/11/2014    SUBJECTIVE: Still significantly short of breath but better. No chest pain. Did have bilateral flank and lateral chest discomfort awakening from sleep last week. None recently.  TELEMETRY:  SInus tach Filed Vitals:   01/11/14 0300 01/11/14 0330 01/11/14 0400 01/11/14 0420  BP: 136/101 144/104    Pulse: 96 95    Temp:   97.5 F (36.4 C)   TempSrc:   Oral   Resp: 21 19    Height:      Weight:   208 lb 15.9 oz (94.8 kg)   SpO2: 97% 97%  96%    Intake/Output Summary (Last 24 hours) at 01/11/14 0732 Last data filed at 01/11/14 0330  Gross per 24 hour  Intake 1677.25 ml  Output    500 ml  Net 1177.25 ml    LABS: Basic Metabolic Panel:  Recent Labs  16/08/9602/04/15 1255 01/11/14 0305  NA 138 137  K 4.7 4.8  CL 100 101  CO2 18* 21  GLUCOSE 343* 126*  BUN 22 16  CREATININE 0.85 0.73  CALCIUM 8.8 8.9   CBC:  Recent Labs  01/10/14 1255 01/11/14 0305  WBC 17.0* 13.3*  NEUTROABS 14.6*  --   HGB 14.7 13.2  HCT 43.1 39.1  MCV 93.3 93.8  PLT 214 219   Cardiac Enzymes:  Recent Labs  01/10/14 1725 01/10/14 2325 01/11/14 0305  TROPONINI 1.00* 1.14* 0.99*     Radiology/Studies:  CXR with COPD  Physical Exam: Blood pressure 144/104, pulse 95, temperature 97.5 F (36.4 C), temperature source Oral, resp. rate 19, height 6\' 4"  (1.93 m), weight 208 lb 15.9 oz (94.8 kg), SpO2 96.00%. Weight change:    Decreased breath sounds and wheezes  ASSESSMENT:  1. NSTEMI, type 2 2. Severe COPD 3. Prior bilateral lateral chest discomfort.   Plan:  1. ECG this AM 2. Echo 3. Ischemic eval eventually, probably as OP, once lungs are better.  Selinda EonSigned, Zeena Starkel III,Debria Broecker W 01/11/2014, 7:32 AM

## 2014-01-12 ENCOUNTER — Encounter (HOSPITAL_COMMUNITY): Payer: Self-pay

## 2014-01-12 DIAGNOSIS — I2609 Other pulmonary embolism with acute cor pulmonale: Secondary | ICD-10-CM

## 2014-01-12 LAB — GLUCOSE, CAPILLARY
GLUCOSE-CAPILLARY: 169 mg/dL — AB (ref 70–99)
GLUCOSE-CAPILLARY: 138 mg/dL — AB (ref 70–99)
Glucose-Capillary: 189 mg/dL — ABNORMAL HIGH (ref 70–99)
Glucose-Capillary: 135 mg/dL — ABNORMAL HIGH (ref 70–99)

## 2014-01-12 MED ORDER — METHYLPREDNISOLONE SODIUM SUCC 125 MG IJ SOLR
60.0000 mg | INTRAMUSCULAR | Status: DC
Start: 2014-01-13 — End: 2014-01-13
  Administered 2014-01-13: 60 mg via INTRAVENOUS
  Filled 2014-01-12 (×2): qty 0.96

## 2014-01-12 MED ORDER — IPRATROPIUM-ALBUTEROL 0.5-2.5 (3) MG/3ML IN SOLN
3.0000 mL | Freq: Four times a day (QID) | RESPIRATORY_TRACT | Status: DC
  Administered 2014-01-13 (×3): 3 mL via RESPIRATORY_TRACT
  Filled 2014-01-12 (×3): qty 3

## 2014-01-12 MED ORDER — IPRATROPIUM-ALBUTEROL 0.5-2.5 (3) MG/3ML IN SOLN: 3.0000 mL | RESPIRATORY_TRACT | Status: DC | PRN

## 2014-01-12 NOTE — Progress Notes (Signed)
       [facility] Date of Encounter: 01/12/2014    SUBJECTIVE:Feeling better. No chest pain. No unilateral LE edema.   TELEMETRY:  NSR and ST Filed Vitals:   01/12/14 0058 01/12/14 0417 01/12/14 0453 01/12/14 0740  BP:  139/99    Pulse:      Temp:  97.7 F (36.5 C)    TempSrc:  Oral    Resp:  16    Height:      Weight:      SpO2: 96% 96% 98% 96%    Intake/Output Summary (Last 24 hours) at 01/12/14 0831 Last data filed at 01/12/14 0400  Gross per 24 hour  Intake   1760 ml  Output   1550 ml  Net    210 ml    LABS: Basic Metabolic Panel:  Recent Labs  32/44/102/02/21 1255 01/11/14 0305  NA 138 137  K 4.7 4.8  CL 100 101  CO2 18* 21  GLUCOSE 343* 126*  BUN 22 16  CREATININE 0.85 0.73  CALCIUM 8.8 8.9   CBC:  Recent Labs  01/10/14 1255 01/11/14 0305  WBC 17.0* 13.3*  NEUTROABS 14.6*  --   HGB 14.7 13.2  HCT 43.1 39.1  MCV 93.3 93.8  PLT 214 219   Cardiac Enzymes:  Recent Labs  01/10/14 1725 01/10/14 2325 01/11/14 0305  TROPONINI 1.00* 1.14* 0.99*   BNP: No components found with this basename: POCBNP,  Hemoglobin A1C:  Recent Labs  01/10/14 1255  HGBA1C 5.9*   Fasting Lipid Panel:  Recent Labs  01/11/14 0305  CHOL 178  HDL 81  LDLCALC 82  TRIG 75  CHOLHDL 2.2     Physical Exam: Blood pressure 139/99, pulse 99, temperature 97.7 F (36.5 C), temperature source Oral, resp. rate 16, height 6\' 4"  (1.93 m), weight 208 lb 15.9 oz (94.8 kg), SpO2 96.00%. Weight change:    Chest with wheezing much improved Cardiac without gallop or murmur  ASSESSMENT:  1. Acute or chronic cor pulmonale as evidenced by RVE on echo and moderate pilmonary hypertension. D-dimer is elevated, but history is not compatible with PE. 2. Elevated troponin due to type 2 NSTEMI and in this case RV strain.  Plan:  1. As an OP he needs a stress nuclear when lungs are as compensated as possible. 2. Aggressive pulmonary management and  consideration of home O2 to help pulmonary hypertension. 3. Please call if we can help further. Selinda EonSigned, Karee Christopherson III,Draven Laine W 01/12/2014, 8:31 AM

## 2014-01-12 NOTE — Progress Notes (Signed)
[facility] PROGRESS NOTE  Theodore [name]:[id number] DOB: [birth year] DOA: 01/10/2014 PCP: No PCP Per Patient  Brief narrative: 58 y.o. male with past medical history of COPD GOLD IV who presented to [facility] ED 01/10/2014 with worsening shortness of breath especially with exertion for past few days prior to this admission. There was also associated cough and no signficant improvement with home nebulizer treatments. As part of the admission workup, cardiac markers were obtained. Troponin I level has increased from 0.12 up to 0.38 up to 1.0. The 12 lead EKG showed sinus tachycardia with nonspecific ST-T changes and an incomplete right bundle branch block. Chest x-ray showed no evidence of pulmonary edema or infiltrates.   Assessment/Plan:   Principal Problem:  COPD with acute exacerbation  - Continue nebulizer treatments, duo nebs every 4 hours scheduled and every 2 hours as needed for shortness of breath or wheezing  - Continue oxygen support via nasal cannula keep oxygen saturation above 90%; may require oxygen at home on discharge  - Continue Solu-Medrol 60 mg IV but change from every 6 hours to once a day regimen  - Continue antibiotics for community acquired pneumonia, Azithromycin and Rocephin  Active problems:  NSTEMI (non-ST elevated myocardial infarction)  - Appreciate cardiology recommendations   - Followup results of 2-D echo - normal EF and grade 1 diastolic dysfunction  - Troponin trend: 0.12 --> 0.38 --> 1.0 --> 1.14 --> 0.99  - Continue aspirin  Community-acquired pneumonia  - Continue azithromycin and Rocephin  - Blood cultures are showing no growth to date - Legionella negative, strep pneumonia negative   Code Status: full code  Family Communication: no family at the bedside  Disposition Plan: remains inpatient; transfer to telemetry floor  Consultants:  Cardiology  Procedures:  None  Antibiotics:  Azithromycin 01/10/2014 -->  Rocephin 01/10/2014 ->    Manson Passey, MD  [facility] Pager [phone]  If 7PM-7AM, please contact night-coverage [email or web address] Password [facility] 01/12/2014, 8:51 AM   LOS: 2 days    HPI/Subjective: Feels better this am.  Objective: Filed Vitals:   01/12/14 0417 01/12/14 0453 01/12/14 0600 01/12/14 0740  BP: 139/99     Pulse:      Temp: 97.7 F (36.5 C)     TempSrc: Oral     Resp: 16  14   Height:      Weight:      SpO2: 96% 98% 95% 96%    Intake/Output Summary (Last 24 hours) at 01/12/14 0851 Last data filed at 01/12/14 0600  Gross per 24 hour  Intake   1800 ml  Output   1550 ml  Net    250 ml    Exam:   General:  Pt is alert, follows commands appropriately, not in acute distress  Cardiovascular: Regular rate and rhythm, S1/S2 appreciated   Respiratory: Clear to auscultation bilaterally, no wheezing, no crackles, no rhonchi  Abdomen: Soft, non tender, non distended, bowel sounds present, no guarding  Extremities: No edema, pulses DP and PT palpable bilaterally  Neuro: Grossly nonfocal  Data Reviewed: Basic Metabolic Panel:  Recent Labs Lab 01/10/14 1255 01/11/14 0305  NA 138 137  K 4.7 4.8  CL 100 101  CO2 18* 21  GLUCOSE 343* 126*  BUN 22 16  CREATININE 0.85 0.73  CALCIUM 8.8 8.9   Liver Function Tests:  Recent Labs Lab 01/10/14 1255 01/11/14 0305  AST 46* 30  ALT 58* 61*  ALKPHOS 59 54  BILITOT 0.5 0.3  PROT  6.7 6.4  ALBUMIN 3.3* 3.1*   No results found for this basename: LIPASE, AMYLASE,  in the last 168 hours No results found for this basename: AMMONIA,  in the last 168 hours CBC:  Recent Labs Lab 01/10/14 1255 01/11/14 0305  WBC 17.0* 13.3*  NEUTROABS 14.6*  --   HGB 14.7 13.2  HCT 43.1 39.1  MCV 93.3 93.8  PLT 214 219   Cardiac Enzymes:  Recent Labs Lab 01/10/14 1320 01/10/14 1725 01/10/14 2325 01/11/14 0305  TROPONINI 0.38* 1.00* 1.14* 0.99*   BNP: No components found with this basename: POCBNP,  CBG:  Recent Labs Lab  01/11/14 0741 01/11/14 1202 01/11/14 1723 01/11/14 2142 01/12/14 0809  GLUCAP 141* 138* 151* 153* 189*    MRSA PCR SCREENING     Status: None   Collection Time    01/10/14  4:58 PM      Result Value Ref Range Status   MRSA by PCR NEGATIVE  NEGATIVE Final  CULTURE, BLOOD (ROUTINE X 2)     Status: None   Collection Time    01/10/14  7:20 PM      Result Value Ref Range Status   Specimen Description BLOOD RIGHT ANTECUBITAL   Final   Special Requests BOTTLES DRAWN AEROBIC ONLY 4CC   Final   Culture  Setup Time     Final   Value: 01/11/2014 02:37     Performed at [facility]   Culture     Final   Value:        BLOOD CULTURE RECEIVED NO GROWTH TO DATE CULTURE WILL BE HELD FOR 5 DAYS BEFORE ISSUING A FINAL NEGATIVE REPORT     Performed at [facility]   Report Status PENDING   Incomplete  CULTURE, BLOOD (ROUTINE X 2)     Status: None   Collection Time    01/10/14  7:22 PM      Result Value Ref Range Status   Specimen Description BLOOD RIGHT HAND   Final   Special Requests BOTTLES DRAWN AEROBIC ONLY [facility]7CC   Final   Culture  Setup Time     Final   Value: 01/11/2014 02:37     Performed at [facility]   Culture     Final   Value:        BLOOD CULTURE RECEIVED NO GROWTH TO DATE CULTURE WILL BE HELD FOR 5 DAYS BEFORE ISSUING A FINAL NEGATIVE REPORT     Performed at [facility]   Report Status PENDING   Incomplete     Studies: Dg Chest Port 1 View 01/10/2014   IMPRESSION: There is hyperinflation consistent with COPD. There is no evidence of pneumonia nor CHF or other acute cardiopulmonary disease.   Electronically Signed   By: David  [place]azil[place]   On: 01/10/2014 13:01    Scheduled Meds: . aspirin  81 mg Oral Daily  . azithromycin  500 mg Intravenous Q24H  . cefTRIAXone  1 g Intravenous Q24H  . fluticasone  2 spray Each Nare q morning - 10a  . heparin  5,000 Units Subcutaneous 3 times per day  . insulin aspart  0-15 Units Subcutaneous TID WC  . insulin  aspart  0-5 Units Subcutaneous QHS  . ipratropium-albuterol  3 mL Nebulization Q4H  . methylPREDNISolone  60 mg Intravenous Q6H  . pantoprazole  40 mg Oral QAC breakfast  . sodium chloride  3 mL Intravenous Q12H   Continuous Infusions: . albuterol 10 mg/hr (01/10/14  1321)         

## 2014-01-13 DIAGNOSIS — F17200 Nicotine dependence, unspecified, uncomplicated: Secondary | ICD-10-CM

## 2014-01-13 DIAGNOSIS — J441 Chronic obstructive pulmonary disease with (acute) exacerbation: Secondary | ICD-10-CM

## 2014-01-13 LAB — GLUCOSE, CAPILLARY
GLUCOSE-CAPILLARY: 96 mg/dL (ref 70–99)
Glucose-Capillary: 109 mg/dL — ABNORMAL HIGH (ref 70–99)

## 2014-01-13 MED ORDER — LEVOFLOXACIN 750 MG PO TABS
750.0000 mg | ORAL_TABLET | Freq: Every day | ORAL | Status: DC
  Administered 2014-01-13: 750 mg via ORAL
  Filled 2014-01-13: qty 1

## 2014-01-13 MED ORDER — FLUTICASONE PROPIONATE 50 MCG/ACT NA SUSP: 2.0000 | Freq: Every morning | NASAL | Status: DC

## 2014-01-13 MED ORDER — AZITHROMYCIN 500 MG PO TABS
500.0000 mg | ORAL_TABLET | Freq: Every day | ORAL | Status: DC
  Filled 2014-01-13: qty 1

## 2014-01-13 MED ORDER — ASPIRIN 81 MG PO CHEW: 81.0000 mg | CHEWABLE_TABLET | Freq: Every day | ORAL | Status: DC

## 2014-01-13 MED ORDER — LEVOFLOXACIN 750 MG PO TABS: 750.0000 mg | ORAL_TABLET | Freq: Every day | ORAL | Status: DC

## 2014-01-13 MED ORDER — PREDNISONE 5 MG PO TABS: 50.0000 mg | ORAL_TABLET | Freq: Every day | ORAL | Status: DC

## 2014-01-13 MED ORDER — IPRATROPIUM-ALBUTEROL 0.5-2.5 (3) MG/3ML IN SOLN: 3.0000 mL | RESPIRATORY_TRACT | Status: DC | PRN

## 2014-01-13 MED ORDER — PANTOPRAZOLE SODIUM 40 MG PO TBEC: 40.0000 mg | DELAYED_RELEASE_TABLET | Freq: Every day | ORAL | Status: DC

## 2014-01-13 MED ORDER — ALPRAZOLAM 0.25 MG PO TABS: 0.2500 mg | ORAL_TABLET | Freq: Every day | ORAL | Status: DC | PRN

## 2014-01-13 NOTE — Progress Notes (Signed)
PHARMACIST - PHYSICIAN COMMUNICATION DR:   Elisabeth Pigeonevine CONCERNING: Antibiotic IV to Oral Route Change Policy  RECOMMENDATION: This patient is receiving Azithromycin by the intravenous route.  Based on criteria approved by the Pharmacy and Therapeutics Committee, the antibiotic(s) is/are being converted to the equivalent oral dose form(s).   DESCRIPTION: These criteria include:  Patient being treated for a respiratory tract infection, urinary tract infection, cellulitis or clostridium difficile associated diarrhea if on metronidazole  The patient is not neutropenic and does not exhibit a GI malabsorption state  The patient is eating (either orally or via tube) and/or has been taking other orally administered medications for a least 24 hours  The patient is improving clinically and has a Tmax < 100.5  If you have questions about this conversion, please contact the Pharmacy Department  []   [phone]( [phone] )  [facility] []   [phone]( [phone] )  [facility]  []   [phone]( [phone] )  [facility] [x]   [phone]( [phone] )  [facility]   Loralee PacasErin Zaliah Wissner, PharmD, BCPS Pager: [phone] 01/13/2014 10:06 AM

## 2014-01-13 NOTE — Progress Notes (Signed)
SATURATION QUALIFICATIONS: (This note is used to comply with regulatory documentation for home oxygen)  Patient Saturations on Room Air at Rest = 98%  Patient Saturations on Room Air while Ambulating = 87%  Patient Saturations on 2 Liters of oxygen while Ambulating = 94%  Please briefly explain why patient needs home oxygen:very labored breathing with ambulation

## 2014-01-13 NOTE — Plan of Care (Signed)
Problem: Phase II Progression Outcomes Goal: O2 sats > equal to 90% on RA or at baseline Outcome: Not Met (add Reason) Home 02 arranged

## 2014-01-13 NOTE — Progress Notes (Signed)
   CARE MANAGEMENT NOTE 01/13/2014  Patient:  Theodore [name],Theodore [name]   Account Number:  [id number]  Date Initiated:  01/13/2014  Documentation initiated by:  [facility]Maken[facility] Mcgrady  Subjective/Objective Assessment:   COPD with acute exacerbation     Action/Plan:   HH   Anticipated DC Date:  01/13/2014   Anticipated DC Plan:  [name]      DC Planning Services  CM consult      [facility]   Choice offered to / List presented to:  C-1 Patient   DME arranged  OXYGEN      DME agency  [facility]     [facility]  HH-1 RN  HH-2 PT  HH-3 OT  HH-4 NURSE'S AIDE  HH-7 RESPIRATORY THERAPY      HH agency  [facility]   Status of service:  Completed, signed off Medicare Important Message given?   (If response is "NO", the following Medicare IM given date fields will be blank) Date Medicare IM given:   Date Additional Medicare IM given:    Discharge Disposition:  [name]  Per UR Regulation:    If discussed at Long Length of Stay Meetings, dates discussed:    Comments:  01/13/2014 1515 NCM spoke to pt and [facility]. Notified [facility] rep of new referral. Notified [facility] for oxygen for home. Explained to pt to [facility] when [facility] home to deliver oxygen concentrator. Isidoro DonningAlesia Delsa Walder RN CCM Case Mgmt phone [phone]

## 2014-01-13 NOTE — Discharge Instructions (Signed)
Chronic Obstructive Pulmonary Disease  Chronic obstructive pulmonary disease (COPD) is a common lung condition in which airflow from the lungs is limited. COPD is a general term that can be used to describe many different lung problems that limit airflow, including both chronic bronchitis and emphysema.  If you have COPD, your lung function will probably never return to normal, but there are measures you can take to improve lung function and make yourself feel better.   CAUSES   · Smoking (common).    · Exposure to secondhand smoke.    · Genetic problems.  · Chronic inflammatory lung diseases or recurrent infections.  SYMPTOMS   · Shortness of breath, especially with physical activity.    · Deep, persistent (chronic) cough with a large amount of thick mucus.    · Wheezing.    · Rapid breaths (tachypnea).    · Gray or bluish discoloration (cyanosis) of the skin, especially in fingers, toes, or lips.    · Fatigue.    · Weight loss.    · Frequent infections or episodes when breathing symptoms become much worse (exacerbations).    · Chest tightness.  DIAGNOSIS   Your healthcare provider will take a medical history and perform a physical examination to make the initial diagnosis.  Additional tests for COPD may include:   · Lung (pulmonary) function tests.  · Chest X-ray.  · CT scan.  · Blood tests.  TREATMENT   Treatment available to help you feel better when you have COPD include:   · Inhaler and nebulizer medicines. These help manage the symptoms of COPD and make your breathing more comfortable  · Supplemental oxygen. Supplemental oxygen is only helpful if you have a low oxygen level in your blood.    · Exercise and physical activity. These are beneficial for nearly all people with COPD. Some people may also benefit from a pulmonary rehabilitation program.  HOME CARE INSTRUCTIONS   · Take all medicines (inhaled or pills) as directed by your health care provider.  · Only take over-the-counter or prescription medicines  for pain, fever, or discomfort as directed by your health care provider.    · Avoid over-the-counter medicines or cough syrups that dry up your airway (such as antihistamines) and slow down the elimination of secretions unless instructed otherwise by your healthcare provider.    · If you are a smoker, the most important thing that you can do is stop smoking. Continuing to smoke will cause further lung damage and breathing trouble. Ask your health care provider for help with quitting smoking. He or she can direct you to community resources or hospitals that provide support.  · Avoid exposure to irritants such as smoke, chemicals, and fumes that aggravate your breathing.  · Use oxygen therapy and pulmonary rehabilitation if directed by your health care provider. If you require home oxygen therapy, ask your healthcare provider whether you should purchase a pulse oximeter to measure your oxygen level at home.    · Avoid contact with individuals who have a contagious illness.  · Avoid extreme temperature and humidity changes.  · Eat healthy foods. Eating smaller, more frequent meals and resting before meals may help you maintain your strength.  · Stay active, but balance activity with periods of rest. Exercise and physical activity will help you maintain your ability to do things you want to do.  · Preventing infection and hospitalization is very important when you have COPD. Make sure to receive all the vaccines your health care provider recommends, especially the pneumococcal and influenza vaccines. Ask your healthcare provider whether you   need a pneumonia vaccine.  · Learn and use relaxation techniques to manage stress.  · Learn and use controlled breathing techniques as directed by your health care provider. Controlled breathing techniques include:    · Pursed lip breathing. Start by breathing in (inhaling) through your nose for 1 second. Then, purse your lips as if you were going to whistle and breathe out (exhale)  through the pursed lips for 2 seconds.    · Diaphragmatic breathing. Start by putting one hand on your abdomen just above your waist. Inhale slowly through your nose. The hand on your abdomen should move out. Then purse your lips and exhale slowly. You should be able to feel the hand on your abdomen moving in as you exhale.    · Learn and use controlled coughing to clear mucus from your lungs. Controlled coughing is a series of short, progressive coughs. The steps of controlled coughing are:    1. Lean your head slightly forward.    2. Breathe in deeply using diaphragmatic breathing.    3. Try to hold your breath for 3 seconds.    4. Keep your mouth slightly open while coughing twice.    5. Spit any mucus out into a tissue.    6. Rest and repeat the steps once or twice as needed.  SEEK MEDICAL CARE IF:   · You are coughing up more mucus than usual.    · There is a change in the color or thickness of your mucus.    · Your breathing is more labored than usual.    · Your breathing is faster than usual.    SEEK IMMEDIATE MEDICAL CARE IF:   · You have shortness of breath while you are resting.    · You have shortness of breath that prevents you from:  · Being able to talk.    · Performing your usual physical activities.    · You have chest pain lasting longer than 5 minutes.    · Your skin color is more cyanotic than usual.  · You measure low oxygen saturations for longer than 5 minutes with a pulse oximeter.  MAKE SURE YOU:   · Understand these instructions.  · Will watch your condition.  · Will get help right away if you are not doing well or get worse.  Document Released: 08/05/2005 Document Revised: 08/16/2013 Document Reviewed: 06/22/2013  [employer]® Patient Information ©2014 [employer].

## 2014-01-13 NOTE — Discharge Summary (Signed)
Physician Discharge Summary  Theodore [name] WUJ:[id number] DOB: [birth year] DOA: 01/10/2014  PCP: No PCP Per Patient  Admit date: 01/10/2014 Discharge date: 01/13/2014  Recommendations for Outpatient Follow-up:  1. Please continue spiriva and symbicort as prior to this admission. May also use nebulizer treatments as prescribed. Please follow up with Dr. Sherene Sires (will send him a message about your admission/hospitalization). 2. Continue Levaquin for 4 more days on discharge. 3. Taper prednisone starting from 50 mg a day, taper down by 5 mg a day down to 0 mg and then stop. 4. Please make an appt with cardiology to have outpatient studies done; phone number provided in follow up section of AVS.  Discharge Diagnoses:  Active Problems:   COPD with acute exacerbation   COPD exacerbation   DM (diabetes mellitus)   NSTEMI (non-ST elevated myocardial infarction)   Acute cor pulmonale    Discharge Condition: medically stable for discharge home toay with HH orders in place   Diet recommendation: as tolerated   History of present illness:  58 y.o. male with past medical history of COPD GOLD IV who presented to [facility] ED 01/10/2014 with worsening shortness of breath especially with exertion for past few days prior to this admission. There was also associated cough and no signficant improvement with home nebulizer treatments. As part of the admission workup, cardiac markers were obtained. Troponin I level has increased from 0.12 up to 0.38 up to 1.0. The 12 lead EKG showed sinus tachycardia with nonspecific ST-T changes and an incomplete right bundle branch block. Chest x-ray showed no evidence of pulmonary edema or infiltrates.   Assessment/Plan:   Principal Problem:  COPD with acute exacerbation  - Continue spiriva and dulera as recommended by pt pulmonologist; added duoneb every 4 hours PRN - Continue oxygen support via nasal cannula 2 L to keep O2 saturation above 90% - Continue prednisone taper, slow  taper from 50 mg down to 0 mg by 5 mg a day - Continue antibiotics for community acquired pneumonia, was Azithromycin and Rocephin but on discharge Levaquin for 4 more days  Active problems:  NSTEMI (non-ST elevated myocardial infarction)  - Appreciate cardiology recommendations  - Followup results of 2-D echo - normal EF and grade 1 diastolic dysfunction  - Troponin trend: 0.12 --> 0.38 --> 1.0 --> 1.14 --> 0.99  - Continue aspirin  - will have outpt follow up in cardio office and likely stress test outpt Community-acquired pneumonia  - Levaquin on discharge for 4 more days - Blood cultures are showing no growth to date  - Legionella negative, strep pneumonia negative   Code Status: full code  Family Communication: no family at the bedside   Consultants:  Cardiology  Procedures:  None  Antibiotics:  Azithromycin 01/10/2014 --> 01/13/2014  Rocephin 01/10/2014 -> 3/7 2015   Signed:  Manson Passey, MD  [facility] 01/13/2014, 1:55 PM  Pager #: [phone]   Discharge Exam: Filed Vitals:   01/13/14 0436  BP: 126/87  Pulse: 75  Temp: 97.7 F (36.5 C)  Resp: 16   Filed Vitals:   01/13/14 0436 01/13/14 0753 01/13/14 0828 01/13/14 0945  BP: 126/87     Pulse: 75     Temp: 97.7 F (36.5 C)     TempSrc: Oral     Resp: 16     Height:      Weight:      SpO2: 98% 92% 95% 96%    General: Pt is alert, follows commands appropriately, not in  acute distress Cardiovascular: Regular rate and rhythm, S1/S2 appreciated  Respiratory: wheezing in left upper lobe; no rhonchi Abdominal: Soft, non tender, non distended, bowel sounds +, no guarding Extremities: no edema, no cyanosis, pulses palpable bilaterally DP and PT Neuro: Grossly nonfocal  Discharge Instructions  Discharge Orders   Future Appointments Provider Department Dept Phone   01/22/2014 9:00 AM Julio Sicks, NP [facility] [phone]   Future Orders Complete By Expires   Call MD for:  difficulty  breathing, headache or visual disturbances  As directed    Call MD for:  persistant dizziness or light-headedness  As directed    Call MD for:  persistant nausea and vomiting  As directed    Call MD for:  severe uncontrolled pain  As directed    Diet - low sodium heart healthy  As directed    Discharge instructions  As directed    Comments:     1. Please continue spiriva and symbicort as prior to this admission. May also use nebulizer treatments as prescribed. Please follow up with Dr. Sherene Sires (will send him a message about your admission/hospitalization). 2. Continue Levaquin for 4 more days on discharge. 3. Taper prednisone starting from 50 mg a day, taper down by 5 mg a day down to 0 mg and then stop. 4. Please make an appt with cardiology to have outpatient studies done; phone number provided in follow up section of AVS.   Increase activity slowly  As directed        Medication List    STOP taking these medications       doxycycline 100 MG tablet  Commonly known as:  VIBRA-TABS      TAKE these medications       ACAPELLA Misc  Use as directed     albuterol 108 (90 BASE) MCG/ACT inhaler  Commonly known as:  PROAIR HFA  Inhale 2 puffs into the lungs every 6 (six) hours as needed for wheezing or shortness of breath.     ALPRAZolam 0.25 MG tablet  Commonly known as:  XANAX  Take 1 tablet (0.25 mg total) by mouth daily as needed for anxiety.     aspirin 81 MG chewable tablet  Chew 1 tablet (81 mg total) by mouth daily.     DULERA 200-5 MCG/ACT Aero  Generic drug:  mometasone-formoterol  Inhale 2 puffs into the lungs 2 (two) times daily.     famotidine 20 MG tablet  Commonly known as:  PEPCID  take 1 tablet by mouth at bedtime     fluticasone 50 MCG/ACT nasal spray  Commonly known as:  FLONASE  Place 2 sprays into both nostrils every morning.     guaiFENesin 600 MG 12 hr tablet  Commonly known as:  MUCINEX  Take 600 mg by mouth 2 (two) times daily.      ipratropium-albuterol 0.5-2.5 (3) MG/3ML Soln  Commonly known as:  DUONEB  Take 3 mLs by nebulization every 4 (four) hours as needed.     levofloxacin 750 MG tablet  Commonly known as:  LEVAQUIN  Take 1 tablet (750 mg total) by mouth daily.     loratadine 10 MG tablet  Commonly known as:  CLARITIN  Take 10 mg by mouth daily as needed for allergies.     nicotine polacrilex 2 MG lozenge  Commonly known as:  COMMIT  Take 2 mg by mouth as needed for smoking cessation.     pantoprazole 40 MG tablet  Commonly known  as:  PROTONIX  Take 1 tablet (40 mg total) by mouth daily before breakfast.     predniSONE 5 MG tablet  Commonly known as:  DELTASONE  Take 10 tablets (50 mg total) by mouth daily with breakfast.     sodium chloride 0.65 % Soln nasal spray  Commonly known as:  OCEAN  Place 2 sprays into the nose as needed for congestion.     SPIRIVA HANDIHALER 18 MCG inhalation capsule  Generic drug:  tiotropium  inhale the contents of one capsule in the handihaler once daily           Follow-up Information   Follow up with Sandrea Hughs, MD. Schedule an appointment as soon as possible for a visit in 2 weeks. (for COPD)    Specialty:  Pulmonary Disease   Contact information:   [address] [place] [address] [phone]       Follow up with Lesleigh Noe, MD. Schedule an appointment as soon as possible for a visit in 2 weeks. (for outpatient stress test)    Specialty:  Cardiology   Contact information:   [address] [place] [address] [phone]        The results of significant diagnostics from this hospitalization (including imaging, microbiology, ancillary and laboratory) are listed below for reference.    Significant Diagnostic Studies: Dg Chest Port 1 View  01/10/2014   CLINICAL DATA:  History of COPD and asthma now with severe dyspnea and bilateral foot swelling  EXAM: PORTABLE CHEST - 1 VIEW  COMPARISON:  DG CHEST 2 VIEW dated  12/11/2013  FINDINGS: The lungs are hyperinflated. There is no focal infiltrate. There is no evidence of pulmonary interstitial or alveolar edema. There is no pleural effusion or pneumothorax. The cardiac silhouette is normal in size. The pulmonary vascularity is not engorged. The mediastinum is normal in width. The observed portions of the bony thorax appear normal.  IMPRESSION: There is hyperinflation consistent with COPD. There is no evidence of pneumonia nor CHF or other acute cardiopulmonary disease.   Electronically Signed   By: David  [place]   On: 01/10/2014 13:01    Microbiology: Recent Results (from the past 240 hour(s))  MRSA PCR SCREENING     Status: None   Collection Time    01/10/14  4:58 PM      Result Value Ref Range Status   MRSA by PCR NEGATIVE  NEGATIVE Final   Comment:            The GeneXpert MRSA Assay (FDA     approved for NASAL specimens     only), is one component of a     comprehensive MRSA colonization     surveillance program. It is not     intended to diagnose MRSA     infection nor to guide or     monitor treatment for     MRSA infections.  CULTURE, BLOOD (ROUTINE X 2)     Status: None   Collection Time    01/10/14  7:20 PM      Result Value Ref Range Status   Specimen Description BLOOD RIGHT ANTECUBITAL   Final   Special Requests BOTTLES DRAWN AEROBIC ONLY 4CC   Final   Culture  Setup Time     Final   Value: 01/11/2014 02:37     Performed at [facility]   Culture     Final   Value:  BLOOD CULTURE RECEIVED NO GROWTH TO DATE CULTURE WILL BE HELD FOR 5 DAYS BEFORE ISSUING A FINAL NEGATIVE REPORT     [facility]   Report Status PENDING   Incomplete  CULTURE, BLOOD (ROUTINE X 2)     Status: None   Collection Time    01/10/14  7:22 PM      Result Value Ref Range Status   Specimen Description BLOOD RIGHT HAND   Final   Special Requests BOTTLES DRAWN AEROBIC ONLY 7CC   Final   Culture  Setup Time     Final   Value:  01/11/2014 02:37     [facility]formed at [facility]   Culture     Final   Value:        BLOOD CULTURE RECEIVED NO GROWTH TO DATE CULTURE WILL BE HELD FOR 5 DAYS BEFORE ISSUING A FINAL NEGATIVE REPORT     [facility]formed [facility]   Report Status PENDING   Incomplete     Labs: Basic Metabolic Panel:  Recent Labs Lab 01/10/14 1255 01/11/14 0305  NA 138 137  K 4.7 4.8  CL 100 101  CO2 18* 21  GLUCOSE 343* 126*  BUN 22 16  CREATININE 0.85 0.73  CALCIUM 8.8 8.9   Liver Function Tests:  Recent Labs Lab 01/10/14 1255 01/11/14 0305  AST 46* 30  ALT 58* 61*  ALKPHOS 59 54  BILITOT 0.5 0.3  PROT 6.7 6.4  ALBUMIN 3.3* 3.1*   No results found for this basename: LIPASE, AMYLASE,  in the last 168 hours No results found for this basename: AMMONIA,  in the last 168 hours CBC:  Recent Labs Lab 01/10/14 1255 01/11/14 0305  WBC 17.0* 13.3*  NEUTROABS 14.6*  --   HGB 14.7 13.2  HCT 43.1 39.1  MCV 93.3 93.8  PLT 214 219   Cardiac Enzymes:  Recent Labs Lab 01/10/14 1320 01/10/14 1725 01/10/14 2325 01/11/14 0305  TROPONINI 0.38* 1.00* 1.14* 0.99*   BNP: BNP (last 3 results)  Recent Labs  10/24/13 1103 01/10/14 1255  PROBNP 28.0 182.7*   CBG:  Recent Labs Lab 01/12/14 1203 01/12/14 1655 01/12/14 2038 01/13/14 0704 01/13/14 1132  GLUCAP 135* 169* 138* 96 109*    Time coordinating discharge: Over 30 minutes

## 2014-01-13 NOTE — Progress Notes (Signed)
Note work written; available for print under letter section. Manson PasseyAlma [name] [facility]ull[facility]

## 2014-01-13 NOTE — Evaluation (Signed)
Physical Therapy Evaluation Patient Details Name: GERONIMO DILIBERTO MRN: [id number] DOB: [birth year] Today's Date: 01/13/2014 Time: 1221-1239 PT Time Calculation (min): 18 min  PT Assessment / Plan / Recommendation History of Present Illness  With a hx of copd who presents to the ED with marked sob that started about 2-3 days ago, initially awakening pt from sleep. Pt reports having recent URI type symptoms prior to onset of sx. Pt reported no relief with rescue inhalers or home neb tx. In the ED, pt was noted to have marked wheezing and was given continuous nebs still with wheezing and still requiring O2 via mask. Of note, pt was noted to have a borderline elevated troponin.  + for NSTEMI.  Clinical Impression  Pt with sever increased WOB just to ambulate x 80'. Pt had difficulty recovering RR. Pt's sats lowest 89% but had to stop and sit down and unable to proceed to monitor sats. Pt will benefit from PT to address problems listed. Pt may benefit from OP pulmonary rehab.    PT Assessment  Patient needs continued PT services    Follow Up Recommendations  Home health PT (pulmonary rehab.)    Does the patient have the potential to tolerate intense rehabilitation      Barriers to Discharge Decreased caregiver support      Equipment Recommendations  None recommended by PT    Recommendations for Other Services     Frequency Min 3X/week    Precautions / Restrictions Precautions Precautions: Fall Precaution Comments: monitor VS   Pertinent Vitals/Pain PRE On RA sats 99%  During 89% lowest  HR max 131 with ? Drop into 40's.       Mobility  Bed Mobility Overal bed mobility: Modified Independent Transfers Overall transfer level: Needs assistance Transfers: Sit to/from Stand Sit to Stand: Supervision General transfer comment: extra time from low bed Ambulation/Gait Ambulation/Gait assistance: +2 safety/equipment;Min guard Ambulation Distance (Feet): 80 Feet Assistive device:  None Gait Pattern/deviations: Step-through pattern;Decreased stride length Gait velocity: decreased General Gait Details: pt stopped to rest standing x 3 then had to sit down. WOB extremely high./    Exercises     PT Diagnosis: Difficulty walking  PT Problem List: Decreased activity tolerance;Cardiopulmonary status limiting activity PT Treatment Interventions: DME instruction;Gait training;Stair training;Functional mobility training;Therapeutic activities;Therapeutic exercise;Patient/family education     PT Goals(Current goals can be found in the care plan section) Acute Rehab PT Goals Patient Stated Goal: I want to be able to breathe and walk PT Goal Formulation: With patient Time For Goal Achievement: 01/13/14 Potential to Achieve Goals: Good  Visit Information  Last PT Received On: 01/13/14 Assistance Needed: +2 History of Present Illness: With a hx of copd who presents to the ED with marked sob that started about 2-3 days ago, initially awakening pt from sleep. Pt reports having recent URI type symptoms prior to onset of sx. Pt reported no relief with rescue inhalers or home neb tx. In the ED, pt was noted to have marked wheezing and was given continuous nebs still with wheezing and still requiring O2 via mask. Of note, pt was noted to have a borderline elevated troponin. No chest pain currently.       Prior Functioning  Home Living Family/patient expects to be discharged to:: Private residence Living Arrangements: Alone Available Help at Discharge: Available PRN/intermittently Type of Home: House Home Access: Stairs to enter [employer] of Steps: 4 Entrance Stairs-Rails: Right Home Layout: One level Home Equipment: None Prior Function Level of  Independence: Independent Communication Communication: No difficulties    Cognition  Cognition Arousal/Alertness: Awake/alert Behavior During Therapy: WFL for tasks assessed/performed;Anxious (with SOB) Overall  Cognitive Status: Within Functional Limits for tasks assessed    Extremity/Trunk Assessment Upper Extremity Assessment Upper Extremity Assessment: Overall WFL for tasks assessed Lower Extremity Assessment Lower Extremity Assessment: Overall WFL for tasks assessed   Balance    End of Session PT - End of Session Equipment Utilized During Treatment: Gait belt Activity Tolerance: Treatment limited secondary to medical complications (Comment);Patient limited by fatigue (extremely increased WOB) Patient left: in bed;with call bell/phone within reach;with family/visitor present Nurse Communication: Mobility status (pt requesting venti mask and breathing TX.)  GP     Rada HayHill, Laural Eiland Elizabeth 01/13/2014, 1:21 PM Blanchard KelchKaren Bodin Gorka PT [phone]

## 2014-01-15 ENCOUNTER — Telehealth: Payer: Self-pay | Admitting: Internal Medicine

## 2014-01-15 DIAGNOSIS — J449 Chronic obstructive pulmonary disease, unspecified: Secondary | ICD-10-CM

## 2014-01-15 NOTE — Telephone Encounter (Signed)
Pt aware of recs. Order has been placed and [facility] Nothing further needed

## 2014-01-15 NOTE — Telephone Encounter (Signed)
Fine with me, can also use afrin otc x 5 d immediately before flonase

## 2014-01-15 NOTE — Telephone Encounter (Signed)
Spoke with pt - He c/o nasal congestion for past several days.  He doesn't feel like he is getting enough oxygen through nasal cannula.  Pt is using Flonase and nasal saline daily. Pt notices that he breathes more through his mouth when he is like this.   Wants to know if he can get an order for a face mask for oxygen when he has this trouble with his nose.  Please advise

## 2014-01-17 LAB — CULTURE, BLOOD (ROUTINE X 2)
Culture: NO GROWTH
Culture: NO GROWTH

## 2014-01-19 ENCOUNTER — Encounter: Payer: Self-pay | Admitting: Internal Medicine

## 2014-01-19 ENCOUNTER — Telehealth: Payer: Self-pay

## 2014-01-19 ENCOUNTER — Ambulatory Visit (INDEPENDENT_AMBULATORY_CARE_PROVIDER_SITE_OTHER)
Admission: RE | Admit: 2014-01-19 | Discharge: 2014-01-19 | Disposition: A | Discharge status: Home / Self Care | Payer: BC Managed Care – PPO | Source: Ambulatory Visit | Attending: Internal Medicine | Admitting: Internal Medicine

## 2014-01-19 ENCOUNTER — Ambulatory Visit (INDEPENDENT_AMBULATORY_CARE_PROVIDER_SITE_OTHER): Payer: BC Managed Care – PPO | Admitting: Internal Medicine

## 2014-01-19 VITALS — BP 134/72 | HR 92 | Temp 97.8°F | Ht 76.0 in | Wt 211.0 lb

## 2014-01-19 DIAGNOSIS — J189 Pneumonia, unspecified organism: Secondary | ICD-10-CM

## 2014-01-19 DIAGNOSIS — J9610 Chronic respiratory failure, unspecified whether with hypoxia or hypercapnia: Secondary | ICD-10-CM

## 2014-01-19 DIAGNOSIS — J449 Chronic obstructive pulmonary disease, unspecified: Secondary | ICD-10-CM

## 2014-01-19 MED ORDER — LEVOFLOXACIN 750 MG PO TABS: 750.0000 mg | ORAL_TABLET | Freq: Every day | ORAL | Status: DC

## 2014-01-19 NOTE — Patient Instructions (Addendum)
Prednisone ceiling is 20 mg per day and the floor is 10 mg daily for now  See calendar for specific medication instructions and bring it back for each and every office visit for every healthcare provider you see.  Without it,  you may not receive the best quality medical care that we feel you deserve.  You will note that the calendar groups together  your maintenance  medications that are timed at particular times of the day.  Think of this as your checklist for what your doctor has instructed you to do until your next evaluation to see what benefit  there is  to staying on a consistent group of medications intended to keep you well.  The other group at the bottom is entirely up to you to use as you see fit  for specific symptoms that may arise between visits that require you to treat them on an as needed basis.  Think of this as your action plan or "what if" list.   Separating the top medications from the bottom group is fundamental to providing you adequate care going forward.    Please remember to go to the  x-ray department downstairs for your tests - we will call you with the results when they are available.    Please schedule a follow up office visit in 4 weeks, call sooner if needed Add:  Move up f/u to 2 weeks, needs prevar

## 2014-01-19 NOTE — Progress Notes (Signed)
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 Minimally better from albuterol.  Much better resp symptoms p prednisone for cluster ha, def improvement in head congestion, also less cough with prednsione.   cc having more cough at hs > white mucus, freq exac in am's, better p coffeee and gets around to taking am meds only an hour or two p rising. rec Stop foradil Start symbicort 160  Take 2 puffs first thing in am and then another 2 puffs about 12 hours later.  Work on inhaler technique:  .    Prednisone 10 mg take  4 each am x 2 days,   2 each am x 2 days,  1 each am x2days and stop Please schedule a follow up office visit in 4 weeks, sooner if needed with pft's   12/02/2011 f/u ov/Theodore [name] cc better on prednisone then some worse off it but overall  Better vs baseline doe. No cough rec Dulera 200 Take 2 puffs first thing in am and then another 2 puffs about 12 hours later and if you like it fill the prescription  Continue spiriva each am only - take this immediately after dulera  Work on [employer]maintainting perfect inhaler technique      02/19/2012  post hospital followup. Patient was admitted March 27 of 02/06/2012 for a COPD exacerbation. He was treated with IV antibiotics, nebulized bronchodilators, IV steroids. He was discharged on doxycycline and a steroid taper. Chest x-ray showed COPD changes without any acute process. He was changed off his Spiriva to Atrovent nebulizer. He was restarted on Dulera  2 puffs twice daily.  Since discharge.  Patient feels improved w/ decreased cough and congestion .  He has not smoked x 4 weeks. He has finished all his antibiotics and steroids. He will not be able to use atrovent neb due to work schedule.  rec Restart Spiriva  1 puff daily  Stop Atrovent NEB  For Rescue USE ONLY::Try to rest first then if not improved may go to inhaler or neb  May use Xopenex Inhaler 2 puffs every 4 hrs As needed  Wheezing-/trouble breathing - this is your rescue inhaler. -away from home  May use Albuterol NEB every 4 hr as needed for wheezing , trouble breathing - at home  Continue on Dulera 2 puffs Twice daily     03/01/2012 f/u ov/Theodore [name] cc breathing tends worse daily (vs on prednisone) toward end of shift (2pm to 830 pm).  Wakes up each am with rattling producing nothing @  Nl wake up time and not much albuterol use daytime in any form.  Prev aecopd ? Due to change to foradil instead of maintaining dulera and increased cig use which continues  "no more than 1-2 per week" rec Prednisone 10 mg take  4 each am x 2 days,   2 each am x 2 days,  1 each am x2days and stop  Stop smoking completely before smoking completely stops you! Only use your albuterol (plan B  xopenex, Plan C is nebulizer) as a rescue medication o be used up to every 4 hours if needed  Work on perfecting  inhaler technique   04/20/2012 f/u ov/Theodore [name] last cigarette 02/2012 overall no change doe and cough, no purulent sputum rec Congratulations on not smoking - it's the most important aspect of your care  For cough > mucinex 600 up to  2 every 12 hours For breathing xopenex hfa(your inhaler) up to 2 puffs every to 4 hours and only use the nebulizer if can't catch your breathing after the inhaler   04/25/2013 f/u ov/Theodore [name] re copd Chief Complaint  Patient presents with  . Follow-up    Breathing some worse with hot/humid weather. No other new co's today.    Mowed front yard self propelled without stopping and then had to stop every few minutes  on  back yard but still  rarely using saba  rec Dulera and Spiriva are Plan A = automatic =  take these "no matter what" Only use your albuterol (plan B Proaire/  Plan C is the nebulizer)  as   rescue medication to be used if you can't catch your breath by resting or doing a relaxed purse lip breathing pattern. The less you use it, the better it will work when you need it. Ok to use up to every 4 hours if needed  For cough and congestion > mucinex up to 1200 mg every 12 hours.  06/13/2013 acute  ov/Theodore [name] re ? [name] Chief Complaint  Patient presents with  . Acute Visit    increased SOB, rattling in chest, cough - prod in the early mornings.  Fever up to  100.8 in the evenings.    woke up 8/3  Feeling bad with purulent sputm then febrile pm 8/3 and 8/4 no rigors, min increase in saba use just as hfa, not neb. >zpack / pred   06/23/13 Acute OV  pt reports breathing had improved until 8/14  he woke up with SOB, prod cough.  still having some DOE, prod cough with white foamy mucus, chest tightness, wheezing.  CXR> bilateral LL opacities L>R  Levaquin 750mg  daily for 7 days Mucinex DM Twice daily  As needed  Cough/congestion  Prednisone taper over next week  06/28/2013 f/u ov/Theodore [name] f/u ? pna Chief Complaint  Patient presents with  . 1 Week ROV    Reports severe coughing, chest tightness, clear mucus production. Had CXR this morning.  fever gone, cough still severe > clear mucus better than it was Breathing better 8/18 then worse since with increases need for saba but no rsting sob  >>Pred taper   07/14/2013 Follow up and med review  Pt returns for follow up for PNA and med review  We reviewed all his medications and organized them into a med calendar  With pt education  Appears to be taking meds correctly.  Was treated for a LLL PNA , has finished 7 days of Levaquin 750mg . Feeling better with less cough and wheezing .  No fever, hemoptysis, chest pain or orthopnea.   CXR > COPD. Improving but  persistent density in the left lung base. Recommend continued followup  10/02/13 Theodore [name] aecopd x sev days  rec Prednisone 10 mg -Take 4 tabs  daily with food x 4 days, then 3 tabs daily x 4 days, then 2 tabs daily x 4 days, then 1 tab daily x4 days then stop. #40 Stay on dulera & spiriva Albuterol nebs thrice daily as needed  10/04/13  Augmentin 875 mg take one pill twice daily  X 10 days - take at breakfast and supper with large glass of water.   Albuterol neb up to every3 hours and if not comforable at rest p neb go to ER     10/09/2013 f/u ov/Theodore [name] re: aecopd/ no med calendar/ confused with action plan Chief Complaint  Patient presents with  . Acute Visit    Pt c/o increased SOB, chest tightness and chest congestion x 1 wk. He states gets out of breath just standing up or walking a few steps.   Ok at rest RA Last neb 3  h prior to OV . No purulent secretions on augmentin since 11/26 Mint def makes breathing worse >>pred taper   10/24/2013 Follow up and Med review  Patient returns for a two-week followup and medication review. We reviewed all his medications and organized them into a medication calendar with patient education. Patient appears to be taking his medications correctly. Patient has been having a slow to resolve COPD, exacerbation. He was treated with Augmentin  Steroid taper. 3 weeks ago. Patient reports symptoms only minimally improved. He is currently on prednisone 20 mg. Patient was also called in a Z-Pak 10 days ago. Patient reports he did not have any improvement with Z-Pak. He continues to have sinus congestion, drainage, productive cough with rattling mucus. Patient is using Mucinex DM and flutter valve without any help.  Patient reports that he takes his Dulera  and Spiriva on a consistent basis without any missed doses. Says his wheezing and cough worse in evening.  CT chest done last week showed scattered small subpleural nodules including some in  the right  upper lobe which have a somewhat tree-in-bud distribution, suggesting a postinflammatory process. No highly suspicious nodules rec Levaquin  daily for 7 days  Mucinex DM Twice daily  As needed  Cough/congestion  Continue on Prednisone  daily until seen back in office   11/07/2013 f/u ov/Theodore [name] re: aecopd still not back to baseline/ never went to ER Chief Complaint  Patient presents with  . Follow-up    Cough has improved some- still coughing up minimal white sputum.  SOB much better but not quite back at his normal baseline.   walk x sev hundred feet  Still some congested cough pred still at 20 mg per day  Much less saba dep and no longer needing neb form, just the hfa  rec As per med calendar, when need nebulizer as rescue, take prednisone 20 mg per day until no need, then 10 mg per day x 5 d and stop See calendar for specific medication   12/11/2013 f/u ov/Theodore [name] re: ran out of prednisone x sev weeks prior to OV and not compliant with gerd rx Chief Complaint  Patient presents with  . Acute Visit    Pt c/o increased SOB x 6 days- with nausea and vomiting at onset. He also has increased prod cough with white sputum. He has used x 2 in the past wk and is using rescue inhaler at least once per day.   used neb avg once daily since onset of worse breathing  But did not activate the prednisone action plan No discolored mucus  No med calendar  >>pred taper , CXR w/ no acute process.   01/05/2014 Acute OV  Complains of  increased SOB, wheezing, chest tightness, prod cough with white mucus x 3 days.  Denies hemoptysis, nausea, vomiting, edema, f/c/s. , body aches , or  orthopnea.  Started taking 20mg  prednisone few days ago, started steroids x 3 weeks ago ,tried to taper down but symptoms worsen on lower doses of prednisone.  Complains of increased anxiety and panic attacks. Has been using albuterol nebulizer with some relief. rec Doxycyline 100mg  Twice daily  For 7 days  Mucinex DM  Twice daily  As needed  Cough/congestion  Prednisone 40mg  daily for 4 days, then 30mg  daily for 4 days then back to 20mg  tapering dose according to med calendar.  Xanax 0.25mg  daily As needed  Anxiety . May make you sleepy.  Follow up in 2 -3 weeks as planned  and As needed   Fluids and rest  Tylenol As needed  Fever.  NO NSAIDS -advil, ibuprofen , aleve, etc.    Admit date: 01/10/2014  Discharge date: 01/13/2014  Recommendations for Outpatient Follow-up:  1. Please continue spiriva and symbicort as prior to this admission. May also use nebulizer treatments as prescribed .  2. Continue Levaquin for 4 more days on discharge.  3. Taper prednisone starting from 50 mg a day, taper down by 5 mg a day down to 0 mg and then stop.  4. Please make an appt with cardiology to have outpatient studies done; phone number provided in follow up section of AVS.  Discharge Diagnoses:  Active Problems:  COPD with acute exacerbation  COPD exacerbation  DM (diabetes mellitus)  NSTEMI (non-ST elevated myocardial infarction)  Acute cor pulmonale    01/19/2014 post hosp f/u ov/Theodore [name] re: re-establish / continuity of care/ no longer smoking Chief Complaint  Patient presents with  . Follow-up    Pt was hospitalized at [facility] for respiratory distress.  Pt c/o fever, SOB, chest tightness.  mucus thick and white No neb needed yet but still on 30 mg per day prednisone  Sob with more than sitting still.  No obvious day to day or daytime variabilty or assoc cp or chest tightness, subjective wheeze overt sinus or hb symptoms. No unusual exp hx or h/o childhood pna/ asthma or knowledge of premature birth.  Sleeping ok without nocturnal  or early am exacerbation  of respiratory  c/o's or need for noct saba. Also denies any obvious fluctuation of symptoms with weather or environmental changes or other aggravating or alleviating factors except as outlined above   Current Medications, Allergies, Complete Past Medical History,  Past Surgical History, Family History, and Social History were reviewed in [employer] record.  ROS  The following are not active complaints unless bolded sore throat, dysphagia, dental problems, itching, sneezing,  nasal congestion or excess/ purulent secretions, ear ache,   fever, chills, sweats, unintended wt loss, pleuritic or exertional cp, hemoptysis,  orthopnea pnd or leg swelling, presyncope, palpitations, heartburn, abdominal pain, anorexia, nausea, vomiting, diarrhea  or change in bowel or urinary habits, change in stools or urine, dysuria,hematuria,  rash, arthralgias, visual complaints, headache, numbness weakness or ataxia or problems with walking or coordination,  change in mood/affect or memory.                           Objective:        Wt 188 01/09/12 >  07/27/2012  194 > 196 10/24/2012 > 01/23/2013  202> 200 04/25/2013 > 194 06/13/2013 >164 8/15 > 06/28/2013  191 >199 07/14/2013  >208 10/24/2013 > 11/07/2013  214 > 12/11/2013 208 > 01/19/2014 211   W/c bound/ 02 2lpm 24/7 first time   HEENT  mild turbinate edema.  Oropharynx no thrush or excess pnd or cobblestoning.  No JVD or cervical adenopathy. Mild accessory muscle hypertrophy. Trachea midline, nl thryroid.  Faint Exp wheezing , no rhonchi, or crackles   Regular rate and rhythm without murmur gallop or rub or increase P2 or edema.  Abd: no hsm, nl excursion. Ext warm without cyanosis or clubbing.         CXR  01/19/2014 :  Prominent new infiltrate right upper lobe consistent with pneumonia.      Assessment & Plan:

## 2014-01-19 NOTE — Telephone Encounter (Signed)
Message copied by Velvet BatheAULFIELD, ASHLEY L on Fri Jan 19, 2014  5:34 PM ------      Message from: Sandrea HughsWERT, MICHAEL B      Created: Fri Jan 19, 2014  5:30 PM       Call pt:  Reviewed cxr and c/w pneumonia > rx levaquin 750 mg one daily x 5 days (called in) ------

## 2014-01-19 NOTE — Telephone Encounter (Signed)
Pt aware of results and recs, nothing further needed. 

## 2014-01-20 DIAGNOSIS — J9610 Chronic respiratory failure, unspecified whether with hypoxia or hypercapnia: Secondary | ICD-10-CM | POA: Insufficient documentation

## 2014-01-20 DIAGNOSIS — J189 Pneumonia, unspecified organism: Secondary | ICD-10-CM | POA: Insufficient documentation

## 2014-01-20 NOTE — Assessment & Plan Note (Signed)
-   rx 2lpm since 01/10/14  - See echo 01/11/14   Adequate control on present rx, reviewed > no change in rx needed

## 2014-01-20 NOTE — Assessment & Plan Note (Addendum)
-   Levquin 3/4-310 restarted 01/19/14 x 5 more days and f/u with cxr in 2 weeks   Low threshold for CTa because note d dimer elevated  But not deemed clinically relevant at admit and some better since then but def at risk and has cor pulmonale as well> check d dimer  Late add:  D dimer done, marked elevation > CTa pos 01/23/14  > see admit

## 2014-01-20 NOTE — Assessment & Plan Note (Signed)
-   HFA 75% p coaching 03/01/12 > 90% 01/23/2013    - PFT's 12/02/2011  FEV1  0.94 (24%) ratio 39 -  18% response to B 2 and DLCO 60%    -Alpha 1 genotype sent 03/01/12 >  MM  -07/14/2013 Med calendar , 10/24/2013  - self titration of steroids with ceiling of 20/ floor of zero rec 11/07/13 > did not follow action plan 12/11/2013 > repeated same  The goal with a chronic steroid dependent illness is always arriving at the lowest effective dose that controls the disease/symptoms and not accepting a set "formula" which is based on statistics or guidelines that don't always take into account patient  variability or the natural hx of the dz in every individual patient, which may well vary over time.  For now therefore I recommend the patient maintain  Ceiling of 20 and floor of 10 mg per day for now  Says quit smoking - reinforced importance.

## 2014-01-22 ENCOUNTER — Encounter: Payer: BC Managed Care – PPO | Admitting: Adult Health

## 2014-01-22 ENCOUNTER — Telehealth: Payer: Self-pay | Admitting: *Deleted

## 2014-01-22 DIAGNOSIS — R0600 Dyspnea, unspecified: Secondary | ICD-10-CM

## 2014-01-22 NOTE — Telephone Encounter (Signed)
Message copied by Christen ButterASKIN, Zorawar Strollo M on Mon Jan 22, 2014 11:39 AM ------      Message from: Nyoka CowdenWERT, MICHAEL B      Created: Sat Jan 20, 2014  2:40 PM       Call Monday if not a lot better go ahead with ctangiogram ------

## 2014-01-22 NOTE — Telephone Encounter (Signed)
Spoke with the pt  She states that his symptoms are the same, still has fever but no worse  [facility] aware we will call when results are recieved

## 2014-01-23 ENCOUNTER — Ambulatory Visit (INDEPENDENT_AMBULATORY_CARE_PROVIDER_SITE_OTHER)
Admission: RE | Admit: 2014-01-23 | Discharge: 2014-01-23 | Disposition: A | Discharge status: Home / Self Care | Payer: BC Managed Care – PPO | Source: Ambulatory Visit | Attending: Internal Medicine | Admitting: Internal Medicine

## 2014-01-23 ENCOUNTER — Telehealth: Payer: Self-pay | Admitting: Internal Medicine

## 2014-01-23 ENCOUNTER — Inpatient Hospital Stay (HOSPITAL_COMMUNITY)
Admission: EM | Admit: 2014-01-23 | Discharge: 2014-01-25 | DRG: 175 | Disposition: A | Discharge status: Home Health | Payer: BC Managed Care – PPO | Attending: Internal Medicine | Admitting: Internal Medicine

## 2014-01-23 ENCOUNTER — Emergency Department (HOSPITAL_COMMUNITY): Payer: BC Managed Care – PPO

## 2014-01-23 ENCOUNTER — Encounter: Payer: Self-pay | Admitting: Internal Medicine

## 2014-01-23 ENCOUNTER — Encounter (HOSPITAL_COMMUNITY): Payer: Self-pay | Admitting: Emergency Medicine

## 2014-01-23 DIAGNOSIS — J4489 Other specified chronic obstructive pulmonary disease: Secondary | ICD-10-CM | POA: Diagnosis present

## 2014-01-23 DIAGNOSIS — J9620 Acute and chronic respiratory failure, unspecified whether with hypoxia or hypercapnia: Secondary | ICD-10-CM | POA: Diagnosis present

## 2014-01-23 DIAGNOSIS — Z825 Family history of asthma and other chronic lower respiratory diseases: Secondary | ICD-10-CM

## 2014-01-23 DIAGNOSIS — I2699 Other pulmonary embolism without acute cor pulmonale: Secondary | ICD-10-CM | POA: Insufficient documentation

## 2014-01-23 DIAGNOSIS — K219 Gastro-esophageal reflux disease without esophagitis: Secondary | ICD-10-CM | POA: Diagnosis present

## 2014-01-23 DIAGNOSIS — R0600 Dyspnea, unspecified: Secondary | ICD-10-CM

## 2014-01-23 DIAGNOSIS — Z888 Allergy status to other drugs, medicaments and biological substances status: Secondary | ICD-10-CM

## 2014-01-23 DIAGNOSIS — I2510 Atherosclerotic heart disease of native coronary artery without angina pectoris: Secondary | ICD-10-CM

## 2014-01-23 DIAGNOSIS — J9610 Chronic respiratory failure, unspecified whether with hypoxia or hypercapnia: Secondary | ICD-10-CM

## 2014-01-23 DIAGNOSIS — F411 Generalized anxiety disorder: Secondary | ICD-10-CM | POA: Diagnosis present

## 2014-01-23 DIAGNOSIS — J449 Chronic obstructive pulmonary disease, unspecified: Secondary | ICD-10-CM

## 2014-01-23 DIAGNOSIS — Z7982 Long term (current) use of aspirin: Secondary | ICD-10-CM

## 2014-01-23 DIAGNOSIS — Z87891 Personal history of nicotine dependence: Secondary | ICD-10-CM | POA: Diagnosis present

## 2014-01-23 DIAGNOSIS — Z79899 Other long term (current) drug therapy: Secondary | ICD-10-CM

## 2014-01-23 DIAGNOSIS — Z806 Family history of leukemia: Secondary | ICD-10-CM

## 2014-01-23 DIAGNOSIS — I252 Old myocardial infarction: Secondary | ICD-10-CM

## 2014-01-23 DIAGNOSIS — F17200 Nicotine dependence, unspecified, uncomplicated: Secondary | ICD-10-CM | POA: Diagnosis present

## 2014-01-23 DIAGNOSIS — J441 Chronic obstructive pulmonary disease with (acute) exacerbation: Secondary | ICD-10-CM

## 2014-01-23 DIAGNOSIS — R0989 Other specified symptoms and signs involving the circulatory and respiratory systems: Secondary | ICD-10-CM

## 2014-01-23 DIAGNOSIS — R0609 Other forms of dyspnea: Secondary | ICD-10-CM

## 2014-01-23 DIAGNOSIS — M129 Arthropathy, unspecified: Secondary | ICD-10-CM | POA: Diagnosis present

## 2014-01-23 DIAGNOSIS — Z8249 Family history of ischemic heart disease and other diseases of the circulatory system: Secondary | ICD-10-CM

## 2014-01-23 DIAGNOSIS — IMO0002 Reserved for concepts with insufficient information to code with codable children: Secondary | ICD-10-CM

## 2014-01-23 DIAGNOSIS — J9600 Acute respiratory failure, unspecified whether with hypoxia or hypercapnia: Secondary | ICD-10-CM

## 2014-01-23 HISTORY — DX: Unspecified chronic bronchitis: J42

## 2014-01-23 HISTORY — DX: Other pulmonary embolism without acute cor pulmonale: I26.99

## 2014-01-23 HISTORY — DX: Pneumonia, unspecified organism: J18.9

## 2014-01-23 HISTORY — DX: Non-ST elevation (NSTEMI) myocardial infarction: I21.4

## 2014-01-23 HISTORY — DX: Unspecified osteoarthritis, unspecified site: M19.90

## 2014-01-23 HISTORY — DX: Gastro-esophageal reflux disease without esophagitis: K21.9

## 2014-01-23 HISTORY — DX: Dependence on supplemental oxygen: Z99.81

## 2014-01-23 HISTORY — DX: Cluster headache syndrome, unspecified, not intractable: G44.009

## 2014-01-23 HISTORY — DX: Anxiety disorder, unspecified: F41.9

## 2014-01-23 LAB — CBC
HCT: 41.4 % (ref 39.0–52.0)
Hemoglobin: 14.1 g/dL (ref 13.0–17.0)
MCH: 32.3 pg (ref 26.0–34.0)
MCHC: 34.1 g/dL (ref 30.0–36.0)
MCV: 95 fL (ref 78.0–100.0)
Platelets: 326 10*3/uL (ref 150–400)
RBC: 4.36 MIL/uL (ref 4.22–5.81)
RDW: 14.4 % (ref 11.5–15.5)
WBC: 13 10*3/uL — ABNORMAL HIGH (ref 4.0–10.5)

## 2014-01-23 LAB — PROTIME-INR
INR: 1.02 (ref 0.00–1.49)
Prothrombin Time: 13.2 seconds (ref 11.6–15.2)

## 2014-01-23 LAB — PRO B NATRIURETIC PEPTIDE: PRO B NATRI PEPTIDE: 186.3 pg/mL — AB (ref 0–125)

## 2014-01-23 LAB — BASIC METABOLIC PANEL
BUN: 13 mg/dL (ref 6–23)
CALCIUM: 9.2 mg/dL (ref 8.4–10.5)
CO2: 26 mEq/L (ref 19–32)
CREATININE: 0.82 mg/dL (ref 0.50–1.35)
Chloride: 93 mEq/L — ABNORMAL LOW (ref 96–112)
GFR calc non Af Amer: >90 mL/min (ref >90)
Glucose, Bld: 129 mg/dL — ABNORMAL HIGH (ref 70–99)
Potassium: 5.1 mEq/L (ref 3.7–5.3)
Sodium: 135 mEq/L — ABNORMAL LOW (ref 137–147)

## 2014-01-23 LAB — TROPONIN I: Troponin I: <0.3 ng/mL (ref <0.30)

## 2014-01-23 LAB — I-STAT TROPONIN, ED: TROPONIN I, POC: 0.01 ng/mL (ref 0.00–0.08)

## 2014-01-23 LAB — APTT: APTT: 29 s (ref 24–37)

## 2014-01-23 MED ORDER — TIOTROPIUM BROMIDE MONOHYDRATE 18 MCG IN CAPS
18.0000 ug | ORAL_CAPSULE | Freq: Every day | RESPIRATORY_TRACT | Status: DC
  Administered 2014-01-24 – 2014-01-25 (×2): 18 ug via RESPIRATORY_TRACT
  Filled 2014-01-23: qty 5

## 2014-01-23 MED ORDER — PANTOPRAZOLE SODIUM 40 MG PO TBEC
40.0000 mg | DELAYED_RELEASE_TABLET | Freq: Every day | ORAL | Status: DC
  Administered 2014-01-24 – 2014-01-25 (×2): 40 mg via ORAL
  Filled 2014-01-23 (×2): qty 1

## 2014-01-23 MED ORDER — ASPIRIN 81 MG PO CHEW
81.0000 mg | CHEWABLE_TABLET | Freq: Every day | ORAL | Status: DC
  Administered 2014-01-24 – 2014-01-25 (×2): 81 mg via ORAL
  Filled 2014-01-23 (×2): qty 1

## 2014-01-23 MED ORDER — ACETAMINOPHEN 325 MG PO TABS: 650.0000 mg | ORAL_TABLET | Freq: Four times a day (QID) | ORAL | Status: DC | PRN

## 2014-01-23 MED ORDER — ENOXAPARIN SODIUM 100 MG/ML ~~LOC~~ SOLN
90.0000 mg | Freq: Two times a day (BID) | SUBCUTANEOUS | Status: DC
  Administered 2014-01-24 (×2): 90 mg via SUBCUTANEOUS
  Filled 2014-01-23 (×3): qty 1

## 2014-01-23 MED ORDER — IOHEXOL 350 MG/ML SOLN
80.0000 mL | Freq: Once | INTRAVENOUS | Status: AC | PRN
  Administered 2014-01-23: 80 mL via INTRAVENOUS

## 2014-01-23 MED ORDER — ALPRAZOLAM 0.25 MG PO TABS: 0.2500 mg | ORAL_TABLET | Freq: Two times a day (BID) | ORAL | Status: DC | PRN

## 2014-01-23 MED ORDER — LEVOFLOXACIN 750 MG PO TABS
750.0000 mg | ORAL_TABLET | Freq: Every day | ORAL | Status: DC
  Administered 2014-01-24 – 2014-01-25 (×2): 750 mg via ORAL
  Filled 2014-01-23 (×2): qty 1

## 2014-01-23 MED ORDER — SODIUM CHLORIDE 0.9 % IJ SOLN: 3.0000 mL | INTRAMUSCULAR | Status: DC | PRN

## 2014-01-23 MED ORDER — ONDANSETRON HCL 4 MG/2ML IJ SOLN: 4.0000 mg | Freq: Four times a day (QID) | INTRAMUSCULAR | Status: DC | PRN

## 2014-01-23 MED ORDER — ALBUTEROL SULFATE (2.5 MG/3ML) 0.083% IN NEBU
2.5000 mg | INHALATION_SOLUTION | RESPIRATORY_TRACT | Status: DC | PRN
  Administered 2014-01-24: 2.5 mg via RESPIRATORY_TRACT

## 2014-01-23 MED ORDER — ENOXAPARIN SODIUM 100 MG/ML ~~LOC~~ SOLN: 1.0000 mg/kg | Freq: Two times a day (BID) | SUBCUTANEOUS | Status: DC

## 2014-01-23 MED ORDER — IPRATROPIUM-ALBUTEROL 0.5-2.5 (3) MG/3ML IN SOLN
3.0000 mL | Freq: Three times a day (TID) | RESPIRATORY_TRACT | Status: DC
  Administered 2014-01-23 – 2014-01-25 (×5): 3 mL via RESPIRATORY_TRACT
  Filled 2014-01-23 (×5): qty 3

## 2014-01-23 MED ORDER — ALBUTEROL SULFATE HFA 108 (90 BASE) MCG/ACT IN AERS
1.0000 | INHALATION_SPRAY | Freq: Four times a day (QID) | RESPIRATORY_TRACT | Status: DC | PRN
  Filled 2014-01-23: qty 6.7

## 2014-01-23 MED ORDER — FAMOTIDINE 20 MG PO TABS
20.0000 mg | ORAL_TABLET | Freq: Every day | ORAL | Status: DC
  Administered 2014-01-23 – 2014-01-24 (×2): 20 mg via ORAL
  Filled 2014-01-23 (×3): qty 1

## 2014-01-23 MED ORDER — ENOXAPARIN SODIUM 100 MG/ML ~~LOC~~ SOLN
1.0000 mg/kg | Freq: Once | SUBCUTANEOUS | Status: AC
  Administered 2014-01-23: 95 mg via SUBCUTANEOUS
  Filled 2014-01-23: qty 1

## 2014-01-23 MED ORDER — IPRATROPIUM-ALBUTEROL 0.5-2.5 (3) MG/3ML IN SOLN
RESPIRATORY_TRACT | Status: AC
  Administered 2014-01-23: 3 mL via RESPIRATORY_TRACT
  Filled 2014-01-23: qty 3

## 2014-01-23 MED ORDER — POTASSIUM CHLORIDE IN NACL 20-0.9 MEQ/L-% IV SOLN
Freq: Once | INTRAVENOUS | Status: AC
  Administered 2014-01-23: 17:00:00 via INTRAVENOUS
  Filled 2014-01-23: qty 1000

## 2014-01-23 MED ORDER — FLUTICASONE PROPIONATE 50 MCG/ACT NA SUSP
2.0000 | Freq: Every day | NASAL | Status: DC
  Administered 2014-01-24 – 2014-01-25 (×2): 2 via NASAL
  Filled 2014-01-23: qty 16

## 2014-01-23 MED ORDER — PREDNISONE 10 MG PO TABS
10.0000 mg | ORAL_TABLET | Freq: Every day | ORAL | Status: DC
  Administered 2014-01-24 – 2014-01-25 (×2): 10 mg via ORAL
  Filled 2014-01-23 (×3): qty 1

## 2014-01-23 MED ORDER — SODIUM CHLORIDE 0.9 % IJ SOLN: 3.0000 mL | Freq: Two times a day (BID) | INTRAMUSCULAR | Status: DC

## 2014-01-23 MED ORDER — MOMETASONE FURO-FORMOTEROL FUM 200-5 MCG/ACT IN AERO
2.0000 | INHALATION_SPRAY | Freq: Two times a day (BID) | RESPIRATORY_TRACT | Status: DC
  Administered 2014-01-23 – 2014-01-25 (×4): 2 via RESPIRATORY_TRACT
  Filled 2014-01-23: qty 8.8

## 2014-01-23 MED ORDER — ALBUTEROL SULFATE (2.5 MG/3ML) 0.083% IN NEBU
2.5000 mg | INHALATION_SOLUTION | RESPIRATORY_TRACT | Status: DC | PRN
  Filled 2014-01-23: qty 3

## 2014-01-23 MED ORDER — SODIUM CHLORIDE 0.9 % IJ SOLN
3.0000 mL | Freq: Two times a day (BID) | INTRAMUSCULAR | Status: DC
  Administered 2014-01-24: 3 mL via INTRAVENOUS

## 2014-01-23 MED ORDER — SALINE SPRAY 0.65 % NA SOLN
2.0000 | NASAL | Status: DC | PRN
  Filled 2014-01-23: qty 44

## 2014-01-23 MED ORDER — SODIUM CHLORIDE 0.9 % IV SOLN: 250.0000 mL | INTRAVENOUS | Status: DC | PRN

## 2014-01-23 MED ORDER — NICOTINE 14 MG/24HR TD PT24
14.0000 mg | MEDICATED_PATCH | Freq: Every day | TRANSDERMAL | Status: DC
  Administered 2014-01-23 – 2014-01-25 (×3): 14 mg via TRANSDERMAL
  Filled 2014-01-23 (×3): qty 1

## 2014-01-23 MED ORDER — ACETAMINOPHEN 650 MG RE SUPP: 650.0000 mg | Freq: Four times a day (QID) | RECTAL | Status: DC | PRN

## 2014-01-23 MED ORDER — GUAIFENESIN ER 600 MG PO TB12
600.0000 mg | ORAL_TABLET | Freq: Two times a day (BID) | ORAL | Status: DC
  Administered 2014-01-23 – 2014-01-25 (×4): 600 mg via ORAL
  Filled 2014-01-23 (×5): qty 1

## 2014-01-23 MED ORDER — RIVAROXABAN (XARELTO) EDUCATION KIT FOR DVT/PE PATIENTS
PACK | Freq: Once | Status: AC
  Administered 2014-01-23: 23:00:00
  Filled 2014-01-23 (×2): qty 1

## 2014-01-23 MED ORDER — ONDANSETRON HCL 4 MG PO TABS: 4.0000 mg | ORAL_TABLET | Freq: Four times a day (QID) | ORAL | Status: DC | PRN

## 2014-01-23 MED ORDER — GUAIFENESIN-DM 100-10 MG/5ML PO SYRP
5.0000 mL | ORAL_SOLUTION | ORAL | Status: DC | PRN
  Administered 2014-01-25: 5 mL via ORAL
  Filled 2014-01-23: qty 5

## 2014-01-23 NOTE — H&P (Signed)
PATIENT DETAILS Name: Theodore [name] Age: 58 y.o. Sex: male Date of Birth: Feb 20, 1956 Admit Date: 01/23/2014 PCP:No PCP Per Patient   CHIEF COMPLAINT:  Shortness of breath-worsening for a week  HPI: Theodore [name] is a 58 y.o. male with a Past Medical History of COPD, recent non-STEMI who was recently discharged from this hospital on 01/13/14 presents today with the above noted complaint. Apparently since discharge, patient although he improved never got back to his baseline, approximately a week ago he started having worsening shortness of breath, went to his pulmonologist office, was diagnosed with pneumonia and started on Levaquin. However his shortness of breath continued to worsen, as a result a CT in a gram of the chest was done earlier today which was positive for bilateral pulmonary embolism, the patient was then referred to the emergency room. I was subsequently asked to admit this patient for further evaluation and treatment. Per the history obtained, patient has worsening shortness of breath and this is almost exclusively exertional. He denies any chest pain. Denies it denies any syncope. He claims that his right leg is much more swollen than his left leg. He otherwise appears stable and is being admitted for further treatment. Patient denies any headache, fever, nausea, vomiting, chest pain, diarrhea or abdominal pain.  ALLERGIES:   Allergies  Allergen Reactions  . Naproxen Sodium Itching    PAST MEDICAL HISTORY: Past Medical History  Diagnosis Date  . COPD (chronic obstructive pulmonary disease)   . Chronic headache   . Seasonal allergies   . Asthma   . Myocardial infarction     PAST SURGICAL HISTORY: Past Surgical History  Procedure Laterality Date  . Nasal septoplasty w/ turbinoplasty  1983    MEDICATIONS AT HOME: Prior to Admission medications   Medication Sig Start Date End Date Taking? Authorizing Provider  acetaminophen (TYLENOL) 500 MG tablet  Take 1,000 mg by mouth every 8 (eight) hours as needed for mild pain or fever.   Yes Historical Provider, MD  albuterol (PROVENTIL HFA;VENTOLIN HFA) 108 (90 BASE) MCG/ACT inhaler Inhale 1-2 puffs into the lungs every 6 (six) hours as needed for wheezing or shortness of breath.   Yes Historical Provider, MD  albuterol (PROVENTIL) (2.5 MG/3ML) 0.083% nebulizer solution Take 2.5 mg by nebulization every 6 (six) hours as needed for wheezing or shortness of breath.   Yes Historical Provider, MD  ALPRAZolam (XANAX) 0.25 MG tablet Take 0.25 mg by mouth 2 (two) times daily as needed for anxiety.   Yes Historical Provider, MD  aspirin 81 MG chewable tablet Chew 81 mg by mouth daily.   Yes Historical Provider, MD  famotidine (PEPCID) 20 MG tablet Take 20 mg by mouth at bedtime.   Yes Historical Provider, MD  fluticasone (FLONASE) 50 MCG/ACT nasal spray Place 2 sprays into both nostrils daily.   Yes Historical Provider, MD  guaiFENesin (MUCINEX) 600 MG 12 hr tablet Take 600 mg by mouth 2 (two) times daily.   Yes Historical Provider, MD  levofloxacin (LEVAQUIN) 750 MG tablet Take 750 mg by mouth daily.   Yes Historical Provider, MD  mometasone-formoterol (DULERA) 200-5 MCG/ACT AERO Inhale 2 puffs into the lungs 2 (two) times daily.   Yes Historical Provider, MD  nicotine polacrilex (COMMIT) 2 MG lozenge Take 2 mg by mouth as needed for smoking cessation.   Yes Historical Provider, MD  pantoprazole (PROTONIX) 40 MG tablet Take 40 mg by mouth daily.   Yes Historical Provider,  MD  predniSONE (DELTASONE) 10 MG tablet Take 10 mg by mouth daily with breakfast.   Yes Historical Provider, MD  sodium chloride (OCEAN) 0.65 % SOLN nasal spray Place 2 sprays into the nose as needed for congestion. 02/06/12  Yes Bernadene Person, NP  tiotropium (SPIRIVA) 18 MCG inhalation capsule Place 18 mcg into inhaler and inhale daily.   Yes Historical Provider, MD    FAMILY HISTORY: Family History  Problem Relation Age of Onset    . Leukemia Father   . Heart disease Father   . Asthma Sister   . Asthma Father   . Emphysema Sister     SOCIAL HISTORY:  reports that he quit smoking about 2 years ago. His smoking use included Cigarettes. He smoked 0.00 packs per [name] for 35 years. He has never used smokeless tobacco. He reports that he drinks alcohol. He reports that he does not use illicit drugs.  REVIEW OF SYSTEMS:  Constitutional:   No  weight loss, night sweats,  Fevers, chills, fatigue.  HEENT:    No headaches, Difficulty swallowing,Tooth/dental problems,Sore throat,  No sneezing, itching, ear ache, nasal congestion, post nasal drip,   Cardio-vascular: No chest pain,  Orthopnea, PND,  anasarca, dizziness, palpitations  GI:  No heartburn, indigestion, abdominal pain, nausea, vomiting, diarrhea, change in  bowel habits, loss of appetite  Resp: No shortness of breath at rest.  No excess mucus, no productive cough, No non-productive cough,  No coughing up of blood.No change in color of mucus.No wheezing.No chest wall deformity  Skin:  no rash or lesions.  GU:  no dysuria, change in color of urine, no urgency or frequency.  No flank pain.  Musculoskeletal: No joint pain or swelling.  No decreased range of motion.  No back pain.  Psych: No change in mood or affect. No depression or anxiety.  No memory loss.   PHYSICAL EXAM: Blood pressure 123/91, pulse 81, temperature 97.7 F (36.5 C), temperature source Oral, resp. rate 15, height 6\' 4"  (1.93 m), weight 95.709 kg (211 lb), SpO2 97.00%.  General appearance :Awake, alert, not in any distress. Speech Clear. Not toxic Looking HEENT: Atraumatic and Normocephalic, pupils equally reactive to light and accomodation Neck: supple, no JVD. No cervical lymphadenopathy.  Chest:Good air entry bilaterally, no added sounds  CVS: S1 S2 regular, no murmurs.  Abdomen: Bowel sounds present, Non tender and not distended with no gaurding, rigidity or  rebound. Extremities: Mild right leg swelling, both legs are warm to touch Neurology: Awake alert, and oriented X 3, CN II-XII intact, Non focal Skin:No Rash Wounds:N/A  LABS ON ADMISSION:   Recent Labs  01/23/14 1603  NA 135*  K 5.1  CL 93*  CO2 26  GLUCOSE 129*  BUN 13  CREATININE 0.82  CALCIUM 9.2   No results found for this basename: AST, ALT, ALKPHOS, BILITOT, PROT, ALBUMIN,  in the last 72 hours No results found for this basename: LIPASE, AMYLASE,  in the last 72 hours  Recent Labs  01/23/14 1603  WBC 13.0*  HGB 14.1  HCT 41.4  MCV 95.0  PLT 326    Recent Labs  01/23/14 1622  TROPONINI <0.30   No results found for this basename: DDIMER,  in the last 72 hours No components found with this basename: POCBNP,    RADIOLOGIC STUDIES ON ADMISSION: Ct Angio Chest Pe W/cm &/or Wo Cm  01/23/2014   CLINICAL DATA:  dyspnea, fever, r/o pe  EXAM: CT ANGIOGRAPHY CHEST WITH CONTRAST  TECHNIQUE: Multidetector CT imaging of the chest was performed using the standard protocol during bolus administration of intravenous contrast. Multiplanar CT image reconstructions and MIPs were obtained to evaluate the vascular anatomy.  CONTRAST:  80mL OMNIPAQUE IOHEXOL 350 MG/ML SOLN  COMPARISON:  None.  FINDINGS: The thoracic inlet is unremarkable.  Small subcentimeter subtle lymph nodes are identified. Coronary artery atherosclerotic calcification appreciated.  A filling defect is appreciated within the distal left main pulmonary artery with extension into the left upper lobe branch. A filling defect is appreciated within the lingular branch, and segmental branches of the left lower lobe. Evaluation of the right pulmonary artery demonstrates a mural filling defect within the right upper lobe pulmonary artery centrally, and right lower lobe pulmonary artery as well as filling defects within segmental and subsegmental right lower lobe branches.  An RV/LV ratio was calculated at 1.38. There is also  decreased bowing of the intraventricular septum to the right.  An area of wedge-shaped consolidative density projects within the subpleural periphery of the right upper lobe. There is appreciated with greatest confluence image 30 series 6 measuring 5 x 4 cm in AP by transverse dimensions. An 8 mm nodular density projects in the medial aspect of the right upper lobe image 21 series 6. When compared to the previous study these findings have developed in the interim. Airspace disease is appreciated surrounding the dominant focus. No further regions of consolidation are identified.  Diffuse centrilobular emphysematous changes are appreciated throughout both lungs.  The visualized upper abdominal viscera demonstrate no gross abnormalities.  Review of the MIP images confirms the above findings.  IMPRESSION: 1. Bilateral pulmonary emboli. On the left embolic disease is appreciated within the distal left main pulmonary artery and areas of embolic disease within the left upper lobe, lingula and subsegmental left lower lobe vessels. On the right pulmonary embolic disease is appreciated within segmental right lower lobe vessels and within the right upper lobe pulmonary artery. Positive for acute PE with CTevidence of right heart strain (RV/LV Ratio = 1.38) consistent with at least submassive (intermediate risk) PE. The presence of right heart strain has been associated with an increased risk of morbidity and mortality. 2. Wedge-shaped consolidative density within the periphery of the right upper lobe. Differential considerations include consolidative infiltrate versus is an area of infarction. Atelectasis also cannot be excluded. No ominous etiologies cannot be excluded in surveillance evaluation status post appropriate therapeutic ridging which recommended. Similar differential considerations from the nodule within the apex the right upper lobe. 3. Diffuse emphysematous changes. 4. These results will be called to the ordering  clinician or representative by the Radiologist Assistant, and communication documented in the PACS Dashboard.   Electronically Signed   By: Salome HolmesHector  Cooper M.D.   On: 01/23/2014 14:58   Dg Chest Port 1 View  01/23/2014   CLINICAL DATA:  Shortness of breath  EXAM: PORTABLE CHEST - 1 VIEW  COMPARISON:  CT chest of 01/23/2014  FINDINGS: The lungs are markedly hyperaerated consistent with emphysema with somewhat flattened hemidiaphragms. An area of parenchymal opacity is noted within the periphery of the right upper lobe which may represent an area of pneumonia or possibly infarction in this patient with recently demonstrated pulmonary emboli. Mediastinal contours appear normal. The heart is within normal limits in size. No bony abnormality is seen.  IMPRESSION: 1. Opacity within the right upper lobe may represent pneumonia or possibly pulmonary infarction in this patient with recently demonstrated pulmonary emboli. 2. Emphysema.   Electronically Signed  By: Dwyane Dee M.D.   On: 01/23/2014 16:44     EKG: Independently reviewed. Normal sinus rhythm  ASSESSMENT AND PLAN: Present on Admission:  . Pulmonary embolism - First episode,? Provoked by recent hospitalization. Suspect has a underlying DVT in the right lower extremity.  - Although some radiological signs of right heart strain, no evidence of right heart strain clinically as without JVD or bilateral leg edema.  - Will admit start on therapeutic Lovenox, had a long discussion with patient regarding long-term anticoagulation options-Coumadin versus newer novel anticoagulation agents. He this time would like to be discharged on newer novel anticoagulation agents.I will ask the pharmacy to provide further education, and would plan on switching him to these new agents of his choice on discharge. - We'll obtain an echocardiogram, lower extremity Dopplers to complete the workup. He has already received Lovenox, hence will not draw hypercoagulable panel at  this time, will treat this with anticoagulation for 3-6 months, and would recommend outpatient hematology evaluation prior to discontinuing anticoagulation.  . Acute respiratory failure- hypoxic - Secondary to above, although has severe COPD, lungs are free of rhonchi. - Titrate off oxygen slowly.  Marland Kitchen COPD GOLD IV - Continue chronic prednisone therapy, continue regular inhalers, with DuoNeb nebulizers every 8 hours   . CAD (coronary artery disease) - Continue aspirin, will need outpatient cardiology followup for nuclear stress test. Please see notes from prior admission.   . Smoking - Counseled, place on transdermal nicotine  Further plan will depend as patient's clinical course evolves and further radiologic and laboratory data become available. Patient will be monitored closely.  Above noted plan was discussed with patient, he was in agreement.   DVT Prophylaxis: Not needed as on therapeutic Lovenox  Code Status: Full Code  Total time spent for admission equals 45 minutes.  [facility] Pager [phone]  If 7PM-7AM, please contact night-coverage [email or web address] Password [facility] 01/23/2014, 6:27 PM

## 2014-01-23 NOTE — ED Notes (Signed)
MD at bedside. 

## 2014-01-23 NOTE — ED Notes (Signed)
[facility] and was told that he has bilateral pulmonary embolisms and was instructed to come straight to ED.  Pt has been on oxygen since Friday after being in ICU for possible heart attack and COPD.  Pt was started on abx for 4 days for possible pneumonia.  Pt is alert and oriented, increased shortness of breath with exertion.  Pt states he has felt fatigued and not improved as far as exertion since discharge

## 2014-01-23 NOTE — Telephone Encounter (Signed)
Await ct results

## 2014-01-23 NOTE — ED Notes (Signed)
Report attempted to be called. Unable to give report at this time.

## 2014-01-23 NOTE — ED Provider Notes (Signed)
CSN: [id number]     Arrival date & time 01/23/14  1528 History   First MD Initiated Contact with Patient 01/23/14 1608     Chief Complaint  Patient presents with  . Shortness of Breath    HPI 58 year old male with a history of MI and COPD presents with bilateral pulmonary emboli.  He was admitted to the hospital in early March with a COPD exacerbation. She also had a possible non-ST elevation MI during that admission. He was managed medically. He about a 3 to four-day hospital course and was discharged home. About a week ago he started having increased shortness of breath, low-grade fever. He was diagnosed with pneumonia by his primary care physician about 3 days ago. He was started on antibiotics, Levaquin. He is also started on oxygen, 2 L by nasal cannula. In the interim he has had worsening of his symptoms. shortness of breath is severe. He woke up abruptly in the middle of the night with severe dyspnea and was unable to walk 5 steps. He denies any chest pain. He is noted some mild swelling in his right foot with no pain. Today his primary care physician ordered a chest CT PE study. It was positive for bilateral PEs, and there is suggestion of right heart strain by CT. He was referred directly to the emergency department.  On arrival he is hemodynamically stable. Heart rate is in the 90s. BPs in the 140s. He is satting in the upper 90s on 2 L by nasal cannula.   Past Medical History  Diagnosis Date  . COPD (chronic obstructive pulmonary disease)   . Seasonal allergies   . Asthma   . Pulmonary embolism 01/23/2014  . NSTEMI (non-ST elevated myocardial infarction) 01/2014  . Chronic bronchitis   . Pneumonia 06/2013; 01/2014  . GERD (gastroesophageal reflux disease)   . Cluster headache     "last bout was summer 2014; had them q spring 1991-2001" (01/24/2014)  . Arthritis     "back" (01/24/2014)  . Anxiety   . On home oxygen therapy     "2L; 24/7" (01/24/2014)   Past Surgical History   Procedure Laterality Date  . Nasal septoplasty w/ turbinoplasty Left 1983  . Turbinate reduction Bilateral ~ 1983   Family History  Problem Relation Age of Onset  . Leukemia Father   . Heart disease Father   . Asthma Sister   . Asthma Father   . Emphysema Sister    History  Substance Use Topics  . Smoking status: Former Smoker -- 2.00 packs/day for 35 years    Types: Cigarettes    Quit date: 01/24/2012  . Smokeless tobacco: Never Used  . Alcohol Use: Yes     Comment: 01/24/2014 "holidays; family birthdays"    Review of Systems  Constitutional: Positive for fatigue. Negative for fever and chills.  HENT: Negative for congestion and rhinorrhea.   Eyes: Negative for visual disturbance.  Respiratory: Positive for shortness of breath. Negative for cough.   Cardiovascular: Negative for chest pain and leg swelling.  Gastrointestinal: Negative for nausea, vomiting, abdominal pain and diarrhea.  Genitourinary: Negative for dysuria, urgency, frequency, flank pain and difficulty urinating.  Musculoskeletal: Negative for back pain, neck pain and neck stiffness.  Skin: Negative for rash.  Neurological: Negative for syncope, weakness, numbness and headaches.  All other systems reviewed and are negative.      Allergies  Naproxen sodium  Home Medications   Current Outpatient Rx  Name  Route  Sig  Dispense  Refill  . acetaminophen (TYLENOL) 500 MG tablet   Oral   Take 1,000 mg by mouth every 8 (eight) hours as needed for mild pain or fever.         Marland Kitchen albuterol (PROVENTIL HFA;VENTOLIN HFA) 108 (90 BASE) MCG/ACT inhaler   Inhalation   Inhale 1-2 puffs into the lungs every 6 (six) hours as needed for wheezing or shortness of breath.         Marland Kitchen albuterol (PROVENTIL) (2.5 MG/3ML) 0.083% nebulizer solution   Nebulization   Take 2.5 mg by nebulization every 6 (six) hours as needed for wheezing or shortness of breath.         . ALPRAZolam (XANAX) 0.25 MG tablet   Oral   Take  0.25 mg by mouth 2 (two) times daily as needed for anxiety.         Marland Kitchen aspirin 81 MG chewable tablet   Oral   Chew 81 mg by mouth daily.         . famotidine (PEPCID) 20 MG tablet   Oral   Take 20 mg by mouth at bedtime.         . fluticasone (FLONASE) 50 MCG/ACT nasal spray   Each Nare   Place 2 sprays into both nostrils daily.         Marland Kitchen guaiFENesin (MUCINEX) 600 MG 12 hr tablet   Oral   Take 600 mg by mouth 2 (two) times daily.         Marland Kitchen levofloxacin (LEVAQUIN) 750 MG tablet   Oral   Take 750 mg by mouth daily.         . mometasone-formoterol (DULERA) 200-5 MCG/ACT AERO   Inhalation   Inhale 2 puffs into the lungs 2 (two) times daily.         . nicotine polacrilex (COMMIT) 2 MG lozenge   Oral   Take 2 mg by mouth as needed for smoking cessation.         . pantoprazole (PROTONIX) 40 MG tablet   Oral   Take 40 mg by mouth daily.         . predniSONE (DELTASONE) 10 MG tablet   Oral   Take 10 mg by mouth daily with breakfast.         . sodium chloride (OCEAN) 0.65 % SOLN nasal spray   Nasal   Place 2 sprays into the nose as needed for congestion.         Marland Kitchen tiotropium (SPIRIVA) 18 MCG inhalation capsule   Inhalation   Place 18 mcg into inhaler and inhale daily.         . Rivaroxaban (XARELTO) 15 MG TABS tablet      Take 15 mg BID for 21 days (fisrt dose was  3-18)  then take 20 mg daily.   41 tablet   0   . Rivaroxaban (XARELTO) 20 MG TABS tablet   Oral   Take 1 tablet (20 mg total) by mouth daily with supper.   30 tablet   1     Start taking 20 mg daily after you finish with the ...    BP 121/70  Pulse 102  Temp(Src) 99.3 F (37.4 C) (Oral)  Resp 18  Ht 6\' 4"  (1.93 m)  Wt 202 lb 9.6 oz (91.9 kg)  BMI 24.67 kg/m2  SpO2 94% Physical Exam GENERAL: well developed, well nourished, no acute distress HEENT: atraumatic; normocephalic.  PERRL.  EOMI.  Sclera normal.  Mucus membranes moist.  Oropharynx clear.   NECK: supple.  No JVD.   Normal ROM. CARDIOVASCULAR: heart regular of rate and rhythm.  Normal heart sounds with no murmur, gallop, or rub.  Intact, strong, and bilaterally equal distal pulses.  Skin warm and dry.  No peripheral edema. PULMONARY: chest clear to auscultation bilaterally.  No wheezes, rales, or rhonci.  Normal work of breathing.  Sats upper 90s on 2L [place]. ABDOMEN: soft, non-distended, non-tender.  No pulsatile mass. GU: deferred MUSCULOSKELETAL: atraumatic; no edema NEURO: alert and oriented X 4.  CN 2-12 intact.  Normal tone.  Normal coordination.  Moves all 4 extremities equally with no gross weakness. SKIN: warm and dry with no noted rash, abscess, or cellulitis PSYCH: normal mood and affect; normal memory to recent events  ED Course  Procedures (including critical care time) Labs Review Labs Reviewed  CBC - Abnormal; Notable for the following:    WBC 13.0 (*)    All other components within normal limits  BASIC METABOLIC PANEL - Abnormal; Notable for the following:    Sodium 135 (*)    Chloride 93 (*)    Glucose, Bld 129 (*)    All other components within normal limits  PRO B NATRIURETIC PEPTIDE - Abnormal; Notable for the following:    Pro B Natriuretic peptide (BNP) 186.3 (*)    All other components within normal limits  CBC - Abnormal; Notable for the following:    WBC 11.6 (*)    RBC 4.20 (*)    All other components within normal limits  TROPONIN I  PROTIME-INR  APTT   Imaging Review Ct Angio Chest Pe W/cm &/or Wo Cm  01/23/2014   CLINICAL DATA:  dyspnea, fever, r/o pe  EXAM: CT ANGIOGRAPHY CHEST WITH CONTRAST  TECHNIQUE: Multidetector CT imaging of the chest was performed using the standard protocol during bolus administration of intravenous contrast. Multiplanar CT image reconstructions and MIPs were obtained to evaluate the vascular anatomy.  CONTRAST:  80mL OMNIPAQUE IOHEXOL 350 MG/ML SOLN  COMPARISON:  None.  FINDINGS: The thoracic inlet is unremarkable.  Small subcentimeter  subtle lymph nodes are identified. Coronary artery atherosclerotic calcification appreciated.  A filling defect is appreciated within the distal left main pulmonary artery with extension into the left upper lobe branch. A filling defect is appreciated within the lingular branch, and segmental branches of the left lower lobe. Evaluation of the right pulmonary artery demonstrates a mural filling defect within the right upper lobe pulmonary artery centrally, and right lower lobe pulmonary artery as well as filling defects within segmental and subsegmental right lower lobe branches.  An RV/LV ratio was calculated at 1.38. There is also decreased bowing of the intraventricular septum to the right.  An area of wedge-shaped consolidative density projects within the subpleural periphery of the right upper lobe. There is appreciated with greatest confluence image 30 series 6 measuring 5 x 4 cm in AP by transverse dimensions. An 8 mm nodular density projects in the medial aspect of the right upper lobe image 21 series 6. When compared to the previous study these findings have developed in the interim. Airspace disease is appreciated surrounding the dominant focus. No further regions of consolidation are identified.  Diffuse centrilobular emphysematous changes are appreciated throughout both lungs.  The visualized upper abdominal viscera demonstrate no gross abnormalities.  Review of the MIP images confirms the above findings.  IMPRESSION: 1. Bilateral pulmonary emboli. On the left embolic disease is appreciated within the distal left  main pulmonary artery and areas of embolic disease within the left upper lobe, lingula and subsegmental left lower lobe vessels. On the right pulmonary embolic disease is appreciated within segmental right lower lobe vessels and within the right upper lobe pulmonary artery. Positive for acute PE with CTevidence of right heart strain (RV/LV Ratio = 1.38) consistent with at least submassive  (intermediate risk) PE. The presence of right heart strain has been associated with an increased risk of morbidity and mortality. 2. Wedge-shaped consolidative density within the periphery of the right upper lobe. Differential considerations include consolidative infiltrate versus is an area of infarction. Atelectasis also cannot be excluded. No ominous etiologies cannot be excluded in surveillance evaluation status post appropriate therapeutic ridging which recommended. Similar differential considerations from the nodule within the apex the right upper lobe. 3. Diffuse emphysematous changes. 4. These results will be called to the ordering clinician or representative by the Radiologist Assistant, and communication documented in the PACS Dashboard.   Electronically Signed   By: Salome Holmes M.D.   On: 01/23/2014 14:58   Dg Chest Port 1 View  01/23/2014   CLINICAL DATA:  Shortness of breath  EXAM: PORTABLE CHEST - 1 VIEW  COMPARISON:  CT chest of 01/23/2014  FINDINGS: The lungs are markedly hyperaerated consistent with emphysema with somewhat flattened hemidiaphragms. An area of parenchymal opacity is noted within the periphery of the right upper lobe which may represent an area of pneumonia or possibly infarction in this patient with recently demonstrated pulmonary emboli. Mediastinal contours appear normal. The heart is within normal limits in size. No bony abnormality is seen.  IMPRESSION: 1. Opacity within the right upper lobe may represent pneumonia or possibly pulmonary infarction in this patient with recently demonstrated pulmonary emboli. 2. Emphysema.   Electronically Signed   By: Dwyane Dee M.D.   On: 01/23/2014 16:44     EKG Interpretation   Date/Time:  Tuesday January 23 2014 16:00:24 EDT Ventricular Rate:  90 PR Interval:  152 QRS Duration: 84 QT Interval:  344 QTC Calculation: 420 R Axis:   90 Text Interpretation:  Normal sinus rhythm Rightward axis Borderline ECG No  significant change  since last tracing Confirmed by Ethelda Chick  MD, SAM  [phone]) on 01/23/2014 4:43:39 PM       MDM    [name] is a 58 y.o. male who presents with acute on chronic respiratory failure.  CTA PE study performed by his PCP today revealed bilateral PEs, and he was transferred here for management.  The CT showed possible radiographic evidence of RV collapse.  Here, he is satting upper 90s on 2L [place] (which he has been on for 3-4 days).  He is hemodynamically stable with BP 120s systolic, HR 90s-low 100s.  Non-toxic appearing.  In no acute distress.  No JVD. No peripheral edema. Normal heart sounds.  Remainder of exam unremarkable.  EKG with NSR, right axis deviation, no strain pattern, no ischemic changes. Risk stratifies to relatively low risk given normal vitals, normal troponin, normal BNP.  However, given radiographic suggestion of mild RV collapse and O2 requirement, he will be admitted to hospitalist. Gave therapeutic dose of lovenox. I reviewed labs, CXR, and CTA as detailed above.    Final diagnoses:  Acute pulmonary embolism  Acute respiratory failure  CAD (coronary artery disease)  COPD (chronic obstructive pulmonary disease)  COPD exacerbation  Chronic respiratory failure       Toney Sang, MD 01/25/14 1324

## 2014-01-23 NOTE — ED Provider Notes (Signed)
With worsening shortness of breath for the past 2 days. Patient noted to have pulmonary emboli on CT angiogram of chest. On exam no distress speaks in paragraphs lungs clear auscultation  Doug SouSam Hasheem Voland, MD 01/23/14 1653

## 2014-01-23 NOTE — Telephone Encounter (Signed)
Called # listed for [facility] and was advised I had the wrong #. Called pt home #. Pt is having chest tx, temp of 99.8, increase SOB x few days. No cough per pt. He is taking pred 10 mg daily and is scheduled for CT angio today. Pt was seen 01/19/14. Any further recs MW? Thanks  Allergies  Allergen Reactions  . Naproxen Sodium Itching

## 2014-01-23 NOTE — Telephone Encounter (Signed)
Pt aware. Nothing further needed 

## 2014-01-23 NOTE — Progress Notes (Addendum)
ANTICOAGULATION CONSULT NOTE - Initial Consult  Pharmacy Consult for lovenox Indication: pulmonary embolus  Allergies  Allergen Reactions  . Naproxen Sodium Itching    Patient Measurements: Height: 6\' 4"  (193 cm) Weight: 201 lb 8 oz (91.4 kg) IBW/kg (Calculated) : 86.8 Heparin Dosing Weight:   Vital Signs: Temp: 98 F (36.7 C) (03/17 2007) Temp src: Oral (03/17 2007) BP: 135/92 mmHg (03/17 2007) Pulse Rate: 81 (03/17 2007)  Labs:  Recent Labs  01/23/14 1603 01/23/14 1622  HGB 14.1  --   HCT 41.4  --   PLT 326  --   APTT  --  29  LABPROT  --  13.2  INR  --  1.02  CREATININE 0.82  --   TROPONINI  --  <0.30    Estimated Creatinine Clearance: 119.1 ml/min (by C-G formula based on Cr of 0.82).   Medical History: Past Medical History  Diagnosis Date  . COPD (chronic obstructive pulmonary disease)   . Chronic headache   . Seasonal allergies   . Asthma   . Myocardial infarction     Medications:  Scheduled:  . aspirin  81 mg Oral Daily  . enoxaparin (LOVENOX) injection  1 mg/kg Subcutaneous Q12H  . famotidine  20 mg Oral QHS  . fluticasone  2 spray Each Nare Daily  . guaiFENesin  600 mg Oral BID  . ipratropium-albuterol  3 mL Nebulization TID  . levofloxacin  750 mg Oral Daily  . mometasone-formoterol  2 puff Inhalation BID  . nicotine  14 mg Transdermal Daily  . pantoprazole  40 mg Oral Daily  . [START ON 01/24/2014] predniSONE  10 mg Oral Q breakfast  . rivaroxaban   Does not apply Once  . sodium chloride  3 mL Intravenous Q12H  . sodium chloride  3 mL Intravenous Q12H  . [START ON 01/24/2014] tiotropium  18 mcg Inhalation Daily   Infusions:    Assessment: 58 yo male with PE will be continued on lovenox treatment dose.  Patient's baseline Hgb is 14.1, Plt 326 K and SCr 0.82 (CrCl >100).  He got a dose of lovenox 95mg  SQ x1 at 1659 on 01/23/14.  Patient weighs ~91 kg Goal of Therapy:  Anti-Xa level 0.6-1 units/ml 4hrs after LMWH dose given Monitor  platelets by anticoagulation protocol: Yes   Plan:  1) Lovenox 90 mg sq q12h, 1st dose at 0500 on 01/24/14 2) CBC every 72 hours 3) Educated patient on the differences among Xarelto, Eliquis and Pradaxa (e.g. Dosing, etc) and he's going to think about which one to choose tomorrow when the medical team sees him.  So, Tsz-Yin 01/23/2014,8:27 PM  Addendum: Plan is to change Lovenox to Xarelto.  He received a 17:00PM dose of Lovenox therefore, we will make his first dose of Xarelto to start in the morning.  Plan: Xarelto 15mg  bid x 21 days Xarelto 20mg  daily Continue CBC q 72 hr. while hospitalized  Nadara MustardNita Jaquavian Firkus, PharmD., MS Clinical Pharmacist Pager:  [phone] Thank you for allowing pharmacy to be part of this patients care team.

## 2014-01-24 ENCOUNTER — Encounter (HOSPITAL_COMMUNITY): Payer: Self-pay | Admitting: General Practice

## 2014-01-24 DIAGNOSIS — J9600 Acute respiratory failure, unspecified whether with hypoxia or hypercapnia: Secondary | ICD-10-CM

## 2014-01-24 DIAGNOSIS — I2699 Other pulmonary embolism without acute cor pulmonale: Principal | ICD-10-CM

## 2014-01-24 DIAGNOSIS — M7989 Other specified soft tissue disorders: Secondary | ICD-10-CM

## 2014-01-24 DIAGNOSIS — J9610 Chronic respiratory failure, unspecified whether with hypoxia or hypercapnia: Secondary | ICD-10-CM

## 2014-01-24 DIAGNOSIS — I2510 Atherosclerotic heart disease of native coronary artery without angina pectoris: Secondary | ICD-10-CM

## 2014-01-24 DIAGNOSIS — I369 Nonrheumatic tricuspid valve disorder, unspecified: Secondary | ICD-10-CM

## 2014-01-24 LAB — BASIC METABOLIC PANEL
BUN: 15 mg/dL (ref 6–23)
CHLORIDE: 99 meq/L (ref 96–112)
CO2: 25 meq/L (ref 19–32)
Calcium: 9.3 mg/dL (ref 8.4–10.5)
Creatinine, Ser: 0.87 mg/dL (ref 0.50–1.35)
GFR calc non Af Amer: >90 mL/min (ref >90)
GLUCOSE: 81 mg/dL (ref 70–99)
POTASSIUM: 5 meq/L (ref 3.7–5.3)
Sodium: 138 mEq/L (ref 137–147)

## 2014-01-24 LAB — CBC
HCT: 40.3 % (ref 39.0–52.0)
Hemoglobin: 13.4 g/dL (ref 13.0–17.0)
MCH: 31.9 pg (ref 26.0–34.0)
MCHC: 33.3 g/dL (ref 30.0–36.0)
MCV: 96 fL (ref 78.0–100.0)
Platelets: 310 10*3/uL (ref 150–400)
RBC: 4.2 MIL/uL — AB (ref 4.22–5.81)
RDW: 14.7 % (ref 11.5–15.5)
WBC: 11.6 10*3/uL — ABNORMAL HIGH (ref 4.0–10.5)

## 2014-01-24 MED ORDER — RIVAROXABAN 15 MG PO TABS
15.0000 mg | ORAL_TABLET | Freq: Two times a day (BID) | ORAL | Status: DC
  Administered 2014-01-25: 15 mg via ORAL
  Filled 2014-01-24 (×3): qty 1

## 2014-01-24 NOTE — Care Management Note (Signed)
    Page 1 of 2   01/25/2014     11:03:44 AM   CARE MANAGEMENT NOTE 01/25/2014  Patient:  Theodore [name], Theodore [name]   Account Number:  [id number]  Date Initiated:  01/24/2014  Documentation initiated by:  GRAVES-BIGELOW,Shakara Tweedy  Subjective/Objective Assessment:   Pt admitted for SO. CTA + for B PE.     Action/Plan:   Pt is currently active with [facility] for RN and PT services. Pt will need resumption orders when medically stable for d/c.  Benefits check in process for xarelto.   Anticipated DC Date:  01/26/2014   Anticipated DC Plan:  [place]  CM consult      [facility] Choice  Resumption Of Svcs/PTA Provider  HOME HEALTH   Choice offered to / List presented to:          [facility] arranged  HH-1 RN  [place] [facility]      [place].   Status of service:  Completed, signed off Medicare Important Message given?   (If response is "NO", the following Medicare IM given date fields will be blank) Date Medicare IM given:   Date Additional Medicare IM given:    Discharge Disposition:  [place]  Per UR Regulation:  Reviewed for med. necessity/level of care/duration of stay  If discussed at [place] of Stay Meetings, dates discussed:    Comments:   01-25-14 [place], [place] Plan for d/c today. [facility] aware of d/c andorder written to resume [facility] services.  PER NATHANIEL B W/ [employer] -PT WILL NOT HAVE COPAY, HE HAS MET HIS [place] @ 100%-NO PRIOR AUTH REQUIRED.

## 2014-01-24 NOTE — Progress Notes (Signed)
*  PRELIMINARY RESULTS* Vascular Ultrasound Lower extremity venous duplex has been completed.  Preliminary findings: No evidence of DVT.   Farrel DemarkJill Eunice, RDMS, RVT  01/24/2014, 2:27 PM

## 2014-01-24 NOTE — Progress Notes (Signed)
  Echocardiogram 2D Echocardiogram has been performed.  Theodore [name], Theodore [name] 01/24/2014, 2:22 PM

## 2014-01-24 NOTE — Progress Notes (Signed)
UR Completed Mi Balla Graves-Bigelow, RN,BSN [phone]  

## 2014-01-24 NOTE — Progress Notes (Signed)
[facility] PROGRESS NOTE  Theodore [name] [facility] ZOX:[id number] DOB: [birth year] DOA: 01/23/2014 PCP: No PCP Per Patient  Assessment/Plan: 1-Bilateral pulmonary emboli; Ct with right heart stain. continue with Lovenox until ECHO result available. Will then transition to xarelto. Doppler negative for DVT. Hypercoagulable panel was not ordered. He will need this after off Lovenox. He needs PT evaluation.   . Acute respiratory failure- hypoxic  - Secondary to above, although has severe COPD, lungs are free of rhonchi.  - Titrate off oxygen slowly.   Marland Kitchen COPD GOLD IV  - Continue chronic prednisone therapy, continue regular inhalers, with  DuoNeb nebulizers every 8 hours   . CAD (coronary artery disease)  - Continue aspirin, will need outpatient cardiology followup for nuclear stress test. Please see notes from prior admission.   . Smoking  - Counseled, place on transdermal nicotine   Code Status: full code.  Family Communication: care discussed with patient.  Disposition Plan: remain inpatient.    Consultants:  none  Procedures:  ECHO; pending.   Doppler; preliminary negative for DVT.   Antibiotics:  none  HPI/Subjective: Feeling better than yesterday. Dyspnea is on exertion.   Objective: Filed Vitals:   01/24/14 0530  BP: 121/80  Pulse: 96  Temp: 98.2 F (36.8 C)  Resp: 20   No intake or output data in the 24 hours ending 01/24/14 1437 Filed Weights   01/23/14 1554 01/23/14 2007  Weight: 95.709 kg (211 lb) 91.4 kg (201 lb 8 oz)    Exam:   General:  No distress.   Cardiovascular: S 1, S 2 RRR  Respiratory: CTA  Abdomen: Bs present, Nt  Musculoskeletal: trace edema.   Data Reviewed: Basic Metabolic Panel:  Recent Labs Lab 01/23/14 1603 01/24/14 0408  NA 135* 138  K 5.1 5.0  CL 93* 99  CO2 26 25  GLUCOSE 129* 81  BUN 13 15  CREATININE 0.82 0.87  CALCIUM 9.2 9.3   Liver Function Tests: No results found for this basename: AST, ALT,  ALKPHOS, BILITOT, PROT, ALBUMIN,  in the last 168 hours No results found for this basename: LIPASE, AMYLASE,  in the last 168 hours No results found for this basename: AMMONIA,  in the last 168 hours CBC:  Recent Labs Lab 01/23/14 1603 01/24/14 0408  WBC 13.0* 11.6*  HGB 14.1 13.4  HCT 41.4 40.3  MCV 95.0 96.0  PLT 326 310   Cardiac Enzymes:  Recent Labs Lab 01/23/14 1622  TROPONINI <0.30   BNP (last 3 results)  Recent Labs  10/24/13 1103 01/10/14 1255 01/23/14 1622  PROBNP 28.0 182.7* 186.3*   CBG: No results found for this basename: GLUCAP,  in the last 168 hours  No results found for this or any previous visit (from the past 240 hour(s)).   Studies: Ct Angio Chest Pe W/cm &/or Wo Cm  01/23/2014   CLINICAL DATA:  dyspnea, fever, r/o pe  EXAM: CT ANGIOGRAPHY CHEST WITH CONTRAST  TECHNIQUE: Multidetector CT imaging of the chest was performed using the standard protocol during bolus administration of intravenous contrast. Multiplanar CT image reconstructions and MIPs were obtained to evaluate the vascular anatomy.  CONTRAST:  80mL OMNIPAQUE IOHEXOL 350 MG/ML SOLN  COMPARISON:  None.  FINDINGS: The thoracic inlet is unremarkable.  Small subcentimeter subtle lymph nodes are identified. Coronary artery atherosclerotic calcification appreciated.  A filling defect is appreciated within the distal left main pulmonary artery with extension into the left upper lobe branch. A filling defect is appreciated within the  lingular branch, and segmental branches of the left lower lobe. Evaluation of the right pulmonary artery demonstrates a mural filling defect within the right upper lobe pulmonary artery centrally, and right lower lobe pulmonary artery as well as filling defects within segmental and subsegmental right lower lobe branches.  An RV/LV ratio was calculated at 1.38. There is also decreased bowing of the intraventricular septum to the right.  An area of wedge-shaped consolidative  density projects within the subpleural periphery of the right upper lobe. There is appreciated with greatest confluence image 30 series 6 measuring 5 x 4 cm in AP by transverse dimensions. An 8 mm nodular density projects in the medial aspect of the right upper lobe image 21 series 6. When compared to the previous study these findings have developed in the interim. Airspace disease is appreciated surrounding the dominant focus. No further regions of consolidation are identified.  Diffuse centrilobular emphysematous changes are appreciated throughout both lungs.  The visualized upper abdominal viscera demonstrate no gross abnormalities.  Review of the MIP images confirms the above findings.  IMPRESSION: 1. Bilateral pulmonary emboli. On the left embolic disease is appreciated within the distal left main pulmonary artery and areas of embolic disease within the left upper lobe, lingula and subsegmental left lower lobe vessels. On the right pulmonary embolic disease is appreciated within segmental right lower lobe vessels and within the right upper lobe pulmonary artery. Positive for acute PE with CTevidence of right heart strain (RV/LV Ratio = 1.38) consistent with at least submassive (intermediate risk) PE. The presence of right heart strain has been associated with an increased risk of morbidity and mortality. 2. Wedge-shaped consolidative density within the periphery of the right upper lobe. Differential considerations include consolidative infiltrate versus is an area of infarction. Atelectasis also cannot be excluded. No ominous etiologies cannot be excluded in surveillance evaluation status post appropriate therapeutic ridging which recommended. Similar differential considerations from the nodule within the apex the right upper lobe. 3. Diffuse emphysematous changes. 4. These results will be called to the ordering clinician or representative by the Radiologist Assistant, and communication documented in the PACS  Dashboard.   Electronically Signed   By: Salome Holmes M.D.   On: 01/23/2014 14:58   Dg Chest Port 1 View  01/23/2014   CLINICAL DATA:  Shortness of breath  EXAM: PORTABLE CHEST - 1 VIEW  COMPARISON:  CT chest of 01/23/2014  FINDINGS: The lungs are markedly hyperaerated consistent with emphysema with somewhat flattened hemidiaphragms. An area of parenchymal opacity is noted within the periphery of the right upper lobe which may represent an area of pneumonia or possibly infarction in this patient with recently demonstrated pulmonary emboli. Mediastinal contours appear normal. The heart is within normal limits in size. No bony abnormality is seen.  IMPRESSION: 1. Opacity within the right upper lobe may represent pneumonia or possibly pulmonary infarction in this patient with recently demonstrated pulmonary emboli. 2. Emphysema.   Electronically Signed   By: Dwyane Dee M.D.   On: 01/23/2014 16:44    Scheduled Meds: . aspirin  81 mg Oral Daily  . enoxaparin (LOVENOX) injection  90 mg Subcutaneous Q12H  . famotidine  20 mg Oral QHS  . fluticasone  2 spray Each Nare Daily  . guaiFENesin  600 mg Oral BID  . ipratropium-albuterol  3 mL Nebulization TID  . levofloxacin  750 mg Oral Daily  . mometasone-formoterol  2 puff Inhalation BID  . nicotine  14 mg Transdermal Daily  .  pantoprazole  40 mg Oral Daily  . predniSONE  10 mg Oral Q breakfast  . sodium chloride  3 mL Intravenous Q12H  . sodium chloride  3 mL Intravenous Q12H  . tiotropium  18 mcg Inhalation Daily   Continuous Infusions:   Principal Problem:   Pulmonary embolism Active Problems:   COPD GOLD IV   Smoking   CAD (coronary artery disease)   Acute respiratory failure    Time spent: 35 minutes.     Hartley Barefootegalado, Belkys A  [facility] Pager [phone]. If 7PM-7AM, please contact night-coverage at [email or web address], password [email or web address] 01/24/2014, 2:37 PM  LOS: 1 day

## 2014-01-24 NOTE — Progress Notes (Signed)
[facility]  Patient Status: Active (receiving services up to time of hospitalization)  [facility] is providing the following services: RN and PT  If patient discharges after hours, please call [phone].   Kizzie FurnishDonna Fellmy 01/24/2014, 9:32 AM

## 2014-01-24 NOTE — Evaluation (Signed)
Physical Therapy Evaluation Patient Details Name: SERAFIN DECATUR MRN: [id number] DOB: Aug 02, 1956 Today's Date: 01/24/2014 Time: 8338-2505 PT Time Calculation (min): 25 min  PT Assessment / Plan / Recommendation History of Present Illness  admitted with SOB, found to have Bil PE  Clinical Impression  Patient evaluated by Physical Therapy with no further acute PT needs identified.   See below for any follow-up Physial Therapy or equipment needs. PT is signing off. Thank you for this referral.     PT Assessment  All further PT needs can be met in the next venue of care    Follow Up Recommendations  Home health PT    Does the patient have the potential to tolerate intense rehabilitation      Barriers to Discharge        Equipment Recommendations  None recommended by PT    Recommendations for Other Services     Frequency      Precautions / Restrictions Precautions Precautions: Fall   Pertinent Vitals/Pain 98% on RA during gait;  HR variable 90's 100's      Mobility  Bed Mobility Overal bed mobility: Modified Independent Transfers Overall transfer level: Needs assistance Equipment used: None Transfers: Sit to/from Stand Sit to Stand: Supervision Ambulation/Gait Ambulation/Gait assistance: Min guard Ambulation Distance (Feet): 290 Feet Assistive device: None Gait Pattern/deviations: Step-through pattern Gait velocity: decreased Gait velocity interpretation: Below normal speed for age/gender General Gait Details: generally steady, but guarded    Exercises     PT Diagnosis: Generalized weakness;Other (comment) (decr activity toleranc)  PT Problem List: Decreased activity tolerance;Cardiopulmonary status limiting activity PT Treatment Interventions:       PT Goals(Current goals can be found in the care plan section) Acute Rehab PT Goals PT Goal Formulation: No goals set, d/c therapy  Visit Information  Last PT Received On: 01/24/14 Assistance Needed:  +1 History of Present Illness: admitted with SOB, found to have Bil PE       Prior [place] expects to be discharged to:: Private residence Living Arrangements: Alone Available Help at Discharge:  (Mom, siblings will be around to assist) Type of Home: House Home Access: Stairs to enter [employer] of Steps: 4 Entrance Stairs-Rails: Right Home Layout: One level Home Equipment: None Prior Function Level of Independence: Independent Communication Communication: No difficulties    Cognition  Cognition Arousal/Alertness: Awake/alert Behavior During Therapy: WFL for tasks assessed/performed;Anxious Overall Cognitive Status: Within Functional Limits for tasks assessed    Extremity/Trunk Assessment Lower Extremity Assessment Lower Extremity Assessment: Overall WFL for tasks assessed   Balance Balance Overall balance assessment: Needs assistance Sitting-balance support: Feet supported Sitting balance-Leahy Scale: Good Standing balance support: During functional activity;Bilateral upper extremity supported Standing balance-Leahy Scale: Fair  End of Session PT - End of Session Activity Tolerance: Patient tolerated treatment well Patient left: with family/visitor present (sitting EOB) Nurse Communication: Mobility status  GP     Misako Roeder, Tessie Fass 01/24/2014, 6:46 PM 01/24/2014  Donnella Sham, [place] [phone]  (pager)

## 2014-01-25 ENCOUNTER — Telehealth: Payer: Self-pay | Admitting: Internal Medicine

## 2014-01-25 MED ORDER — RIVAROXABAN 15 MG PO TABS: ORAL_TABLET | ORAL | Status: DC

## 2014-01-25 MED ORDER — RIVAROXABAN 20 MG PO TABS: 20.0000 mg | ORAL_TABLET | Freq: Every day | ORAL | Status: DC

## 2014-01-25 NOTE — Progress Notes (Signed)
DC orders received.  Patient stable with no S/S of distress.  Medication and discharge information reviewed with patient and patient's wife.  Patient DC home with wife. Lendora Keys Marie  

## 2014-01-25 NOTE — Discharge Summary (Signed)
Physician Discharge Summary  Len BlalockJames W Carriere WUJ:[id number] DOB: May 26, 1956 DOA: 01/23/2014  PCP: No PCP Per Patient  Admit date: 01/23/2014 Discharge date: 01/25/2014  Time spent: 35 minutes  Recommendations for Outpatient Follow-up:  1. Follow up with Dr Sherene SiresWert for further care of COPD, PE.   Discharge Diagnoses:    Pulmonary embolism   COPD GOLD IV   Smoking   CAD (coronary artery disease)   Acute respiratory failure   Discharge Condition: Stable.   Diet recommendation: Regular diet  Filed Weights   01/23/14 1554 01/23/14 2007 01/25/14 0631  Weight: 95.709 kg (211 lb) 91.4 kg (201 lb 8 oz) 91.9 kg (202 lb 9.6 oz)    History of present illness:  Lind GuestJames W Takaki is a 58 y.o. male with a Past Medical History of COPD, recent non-STEMI who was recently discharged from this hospital on 01/13/14 presents today with the above noted complaint. Apparently since discharge, patient although he improved never got back to his baseline, approximately a week ago he started having worsening shortness of breath, went to his pulmonologist office, was diagnosed with pneumonia and started on Levaquin. However his shortness of breath continued to worsen, as a result a CT in a gram of the chest was done earlier today which was positive for bilateral pulmonary embolism, the patient was then referred to the emergency room. I was subsequently asked to admit this patient for further evaluation and treatment.  Per the history obtained, patient has worsening shortness of breath and this is almost exclusively exertional. He denies any chest pain. Denies it denies any syncope. He claims that his right leg is much more swollen than his left leg. He otherwise appears stable and is being admitted for further treatment.  Patient denies any headache, fever, nausea, vomiting, chest pain, diarrhea or abdominal pain.   Hospital Course:  1-Bilateral pulmonary emboli; Ct with right heart stain.  ECHO with right ventricle  midly dilated. His BP and oxygenation has remain stable. His dyspnea has improved. He was transition to Xarelto.  Doppler negative for DVT. Hypercoagulable panel was not ordered. He will need this after off Lovenox.   . Acute respiratory failure- hypoxic  - Secondary to above, although has severe COPD, lungs are free of rhonchi.  - Titrate off oxygen slowly.   Marland Kitchen. COPD GOLD IV  - Continue chronic prednisone therapy, continue regular inhalers, with  -DuoNeb nebulizers every 8 hours  -He was on levaquin will resume this, to finished treatment.   Marland Kitchen. CAD (coronary artery disease)  - Continue aspirin, will need outpatient cardiology followup for nuclear stress test. Please see notes from prior admission.   . Smoking  - Counseled, place on transdermal nicotine   Procedures: ECHO; - Left ventricle: The cavity size was normal. Wall thickness was normal. Systolic function was normal. The estimated ejection fraction was in the range of 60% to 65%. Left ventricular diastolic function parameters were normal. - Ventricular septum: Septal motion showed abnormal function and dyssynergy. - Aorta: The aortic root was mildly dilated. Aortic root dimension: 40mm (ED). - Left atrium: The atrium was normal in size. - Right ventricle: The cavity size was mildly dilated. Systolic function was normal. - Right atrium: The atrium was normal in size. - Atrial septum: No defect or patent foramen ovale was identified. - Tricuspid valve: Mild regurgitation. - Pulmonary arteries: Poorly visualized. PA peak pressure: 37mm Hg (S). - Inferior vena cava: The vessel was normal in size; the respirophasic diameter changes were in the  normal range (= 50%); findings are consistent with normal central venous pressure. - Pericardium, extracardiac: There was no pericardial effusion.    Consultations:  none  Discharge Exam: Filed Vitals:   01/25/14 0631  BP: 121/70  Pulse: 102  Temp: 99.3 F (37.4 C)  Resp:  18    General: No distress Cardiovascular: S 1, S 2 RRR Respiratory: no wheezes.   Discharge Instructions  Discharge Orders   Future Appointments Provider Department Dept Phone   02/01/2014 9:00 AM Runell Gess, MD [facility] [phone]   02/16/2014 10:30 AM Nyoka Cowden, MD [facility] [phone]   Future Orders Complete By Expires   Diet - low sodium heart healthy  As directed    Increase activity slowly  As directed        Medication List         acetaminophen 500 MG tablet  Commonly known as:  TYLENOL  Take 1,000 mg by mouth every 8 (eight) hours as needed for mild pain or fever.     albuterol (2.5 MG/3ML) 0.083% nebulizer solution  Commonly known as:  PROVENTIL  Take 2.5 mg by nebulization every 6 (six) hours as needed for wheezing or shortness of breath.     albuterol 108 (90 BASE) MCG/ACT inhaler  Commonly known as:  PROVENTIL HFA;VENTOLIN HFA  Inhale 1-2 puffs into the lungs every 6 (six) hours as needed for wheezing or shortness of breath.     ALPRAZolam 0.25 MG tablet  Commonly known as:  XANAX  Take 0.25 mg by mouth 2 (two) times daily as needed for anxiety.     aspirin 81 MG chewable tablet  Chew 81 mg by mouth daily.     DULERA 200-5 MCG/ACT Aero  Generic drug:  mometasone-formoterol  Inhale 2 puffs into the lungs 2 (two) times daily.     famotidine 20 MG tablet  Commonly known as:  PEPCID  Take 20 mg by mouth at bedtime.     fluticasone 50 MCG/ACT nasal spray  Commonly known as:  FLONASE  Place 2 sprays into both nostrils daily.     guaiFENesin 600 MG 12 hr tablet  Commonly known as:  MUCINEX  Take 600 mg by mouth 2 (two) times daily.     levofloxacin 750 MG tablet  Commonly known as:  LEVAQUIN  Take 750 mg by mouth daily.     nicotine polacrilex 2 MG lozenge  Commonly known as:  COMMIT  Take 2 mg by mouth as needed for smoking cessation.     pantoprazole 40 MG tablet  Commonly known as:  PROTONIX  Take  40 mg by mouth daily.     predniSONE 10 MG tablet  Commonly known as:  DELTASONE  Take 10 mg by mouth daily with breakfast.     Rivaroxaban 15 MG Tabs tablet  Commonly known as:  XARELTO  Take 15 mg BID for 21 days (fisrt dose was  3-18)  then take 20 mg daily.     Rivaroxaban 20 MG Tabs tablet  Commonly known as:  XARELTO  Take 1 tablet (20 mg total) by mouth daily with supper.     sodium chloride 0.65 % Soln nasal spray  Commonly known as:  OCEAN  Place 2 sprays into the nose as needed for congestion.     tiotropium 18 MCG inhalation capsule  Commonly known as:  SPIRIVA  Place 18 mcg into inhaler and inhale daily.       Allergies  Allergen Reactions  . Naproxen Sodium Itching       Follow-up Information   Follow up with Sandrea Hughs, MD In 4 days.   Specialty:  Pulmonary Disease   Contact information:   [address] [place] [address] [phone]        The results of significant diagnostics from this hospitalization (including imaging, microbiology, ancillary and laboratory) are listed below for reference.    Significant Diagnostic Studies: Dg Chest 2 View  01/19/2014   CLINICAL DATA:  Fever.  EXAM: CHEST  2 VIEW  COMPARISON:  DG CHEST 1V PORT dated 01/10/2014  FINDINGS: A new prominent right upper lobe infiltrate noted consistent with pneumonia. Changes of COPD noted. Heart size normal. No pleural effusion or pneumothorax. Diffuse osteopenia and degenerative change thoracic spine.  IMPRESSION: Prominent new infiltrate right upper lobe consistent with pneumonia.   Electronically Signed   By: Maisie Fus  Register   On: 01/19/2014 15:55   Ct Angio Chest Pe W/cm &/or Wo Cm  01/23/2014   CLINICAL DATA:  dyspnea, fever, r/o pe  EXAM: CT ANGIOGRAPHY CHEST WITH CONTRAST  TECHNIQUE: Multidetector CT imaging of the chest was performed using the standard protocol during bolus administration of intravenous contrast. Multiplanar CT image reconstructions and MIPs were  obtained to evaluate the vascular anatomy.  CONTRAST:  80mL OMNIPAQUE IOHEXOL 350 MG/ML SOLN  COMPARISON:  None.  FINDINGS: The thoracic inlet is unremarkable.  Small subcentimeter subtle lymph nodes are identified. Coronary artery atherosclerotic calcification appreciated.  A filling defect is appreciated within the distal left main pulmonary artery with extension into the left upper lobe branch. A filling defect is appreciated within the lingular branch, and segmental branches of the left lower lobe. Evaluation of the right pulmonary artery demonstrates a mural filling defect within the right upper lobe pulmonary artery centrally, and right lower lobe pulmonary artery as well as filling defects within segmental and subsegmental right lower lobe branches.  An RV/LV ratio was calculated at 1.38. There is also decreased bowing of the intraventricular septum to the right.  An area of wedge-shaped consolidative density projects within the subpleural periphery of the right upper lobe. There is appreciated with greatest confluence image 30 series 6 measuring 5 x 4 cm in AP by transverse dimensions. An 8 mm nodular density projects in the medial aspect of the right upper lobe image 21 series 6. When compared to the previous study these findings have developed in the interim. Airspace disease is appreciated surrounding the dominant focus. No further regions of consolidation are identified.  Diffuse centrilobular emphysematous changes are appreciated throughout both lungs.  The visualized upper abdominal viscera demonstrate no gross abnormalities.  Review of the MIP images confirms the above findings.  IMPRESSION: 1. Bilateral pulmonary emboli. On the left embolic disease is appreciated within the distal left main pulmonary artery and areas of embolic disease within the left upper lobe, lingula and subsegmental left lower lobe vessels. On the right pulmonary embolic disease is appreciated within segmental right lower lobe  vessels and within the right upper lobe pulmonary artery. Positive for acute PE with CTevidence of right heart strain (RV/LV Ratio = 1.38) consistent with at least submassive (intermediate risk) PE. The presence of right heart strain has been associated with an increased risk of morbidity and mortality. 2. Wedge-shaped consolidative density within the periphery of the right upper lobe. Differential considerations include consolidative infiltrate versus is an area of infarction. Atelectasis also cannot be excluded. No ominous etiologies cannot be  excluded in surveillance evaluation status post appropriate therapeutic ridging which recommended. Similar differential considerations from the nodule within the apex the right upper lobe. 3. Diffuse emphysematous changes. 4. These results will be called to the ordering clinician or representative by the Radiologist Assistant, and communication documented in the PACS Dashboard.   Electronically Signed   By: Salome Holmes M.D.   On: 01/23/2014 14:58   Dg Chest Port 1 View  01/23/2014   CLINICAL DATA:  Shortness of breath  EXAM: PORTABLE CHEST - 1 VIEW  COMPARISON:  CT chest of 01/23/2014  FINDINGS: The lungs are markedly hyperaerated consistent with emphysema with somewhat flattened hemidiaphragms. An area of parenchymal opacity is noted within the periphery of the right upper lobe which may represent an area of pneumonia or possibly infarction in this patient with recently demonstrated pulmonary emboli. Mediastinal contours appear normal. The heart is within normal limits in size. No bony abnormality is seen.  IMPRESSION: 1. Opacity within the right upper lobe may represent pneumonia or possibly pulmonary infarction in this patient with recently demonstrated pulmonary emboli. 2. Emphysema.   Electronically Signed   By: Dwyane Dee M.D.   On: 01/23/2014 16:44   Dg Chest Port 1 View  01/10/2014   CLINICAL DATA:  History of COPD and asthma now with severe dyspnea and  bilateral foot swelling  EXAM: PORTABLE CHEST - 1 VIEW  COMPARISON:  DG CHEST 2 VIEW dated 12/11/2013  FINDINGS: The lungs are hyperinflated. There is no focal infiltrate. There is no evidence of pulmonary interstitial or alveolar edema. There is no pleural effusion or pneumothorax. The cardiac silhouette is normal in size. The pulmonary vascularity is not engorged. The mediastinum is normal in width. The observed portions of the bony thorax appear normal.  IMPRESSION: There is hyperinflation consistent with COPD. There is no evidence of pneumonia nor CHF or other acute cardiopulmonary disease.   Electronically Signed   By: David  [place]   On: 01/10/2014 13:01    Microbiology: No results found for this or any previous visit (from the past 240 hour(s)).   Labs: Basic Metabolic Panel:  Recent Labs Lab 01/23/14 1603 01/24/14 0408  NA 135* 138  K 5.1 5.0  CL 93* 99  CO2 26 25  GLUCOSE 129* 81  BUN 13 15  CREATININE 0.82 0.87  CALCIUM 9.2 9.3   Liver Function Tests: No results found for this basename: AST, ALT, ALKPHOS, BILITOT, PROT, ALBUMIN,  in the last 168 hours No results found for this basename: LIPASE, AMYLASE,  in the last 168 hours No results found for this basename: AMMONIA,  in the last 168 hours CBC:  Recent Labs Lab 01/23/14 1603 01/24/14 0408  WBC 13.0* 11.6*  HGB 14.1 13.4  HCT 41.4 40.3  MCV 95.0 96.0  PLT 326 310   Cardiac Enzymes:  Recent Labs Lab 01/23/14 1622  TROPONINI <0.30   BNP: BNP (last 3 results)  Recent Labs  10/24/13 1103 01/10/14 1255 01/23/14 1622  PROBNP 28.0 182.7* 186.3*   CBG: No results found for this basename: GLUCAP,  in the last 168 hours     Signed:  Hartley Barefoot A  [facility] 01/25/2014, 10:52 AM

## 2014-01-25 NOTE — Telephone Encounter (Signed)
Called and spoke with pt and he stated that he was admitted to the hospital on 3-17 and d/c on 3/19 and pt stated that while he was in the hospital he was treated with levaquin each day and when they d/c him they did not send him home with any abx.  Pt wanted to check with MW to see if he needed to be on the levaquin for a few more days.  Pt is aware that we will call him tomorrow after checking with MW.  MW please advise. Thanks  Allergies  Allergen Reactions  . Naproxen Sodium Itching     Current Outpatient Prescriptions on File Prior to Visit  Medication Sig Dispense Refill  . acetaminophen (TYLENOL) 500 MG tablet Take 1,000 mg by mouth every 8 (eight) hours as needed for mild pain or fever.      Marland Kitchen. albuterol (PROVENTIL HFA;VENTOLIN HFA) 108 (90 BASE) MCG/ACT inhaler Inhale 1-2 puffs into the lungs every 6 (six) hours as needed for wheezing or shortness of breath.      Marland Kitchen. albuterol (PROVENTIL) (2.5 MG/3ML) 0.083% nebulizer solution Take 2.5 mg by nebulization every 6 (six) hours as needed for wheezing or shortness of breath.      . ALPRAZolam (XANAX) 0.25 MG tablet Take 0.25 mg by mouth 2 (two) times daily as needed for anxiety.      Marland Kitchen. aspirin 81 MG chewable tablet Chew 81 mg by mouth daily.      . famotidine (PEPCID) 20 MG tablet Take 20 mg by mouth at bedtime.      . fluticasone (FLONASE) 50 MCG/ACT nasal spray Place 2 sprays into both nostrils daily.      Marland Kitchen. guaiFENesin (MUCINEX) 600 MG 12 hr tablet Take 600 mg by mouth 2 (two) times daily.      Marland Kitchen. levofloxacin (LEVAQUIN) 750 MG tablet Take 750 mg by mouth daily.      . mometasone-formoterol (DULERA) 200-5 MCG/ACT AERO Inhale 2 puffs into the lungs 2 (two) times daily.      . nicotine polacrilex (COMMIT) 2 MG lozenge Take 2 mg by mouth as needed for smoking cessation.      . pantoprazole (PROTONIX) 40 MG tablet Take 40 mg by mouth daily.      . predniSONE (DELTASONE) 10 MG tablet Take 10 mg by mouth daily with breakfast.      . Rivaroxaban  (XARELTO) 15 MG TABS tablet Take 15 mg BID for 21 days (fisrt dose was  3-18)  then take 20 mg daily.  41 tablet  0  . Rivaroxaban (XARELTO) 20 MG TABS tablet Take 1 tablet (20 mg total) by mouth daily with supper.  30 tablet  1  . sodium chloride (OCEAN) 0.65 % SOLN nasal spray Place 2 sprays into the nose as needed for congestion.      Marland Kitchen. tiotropium (SPIRIVA) 18 MCG inhalation capsule Place 18 mcg into inhaler and inhale daily.      . [DISCONTINUED] budesonide-formoterol (SYMBICORT) 160-4.5 MCG/ACT inhaler Take 2 puffs first thing in am and then another 2 puffs about 12 hours later.     1 Inhaler  12   No current facility-administered medications on file prior to visit.

## 2014-01-25 NOTE — Discharge Instructions (Signed)
Information on my medicine - XARELTO (rivaroxaban)  This medication education was reviewed with me or my healthcare representative as part of my discharge preparation.  The pharmacist that spoke with me during my hospital stay was:  Pasty Spillersobertson, Ledora Delker Stillinger, [facility]  WHY WAS Carlena HurlXARELTO PRESCRIBED FOR YOU? Xarelto was prescribed to treat blood clots that may have been found in the veins of your legs (deep vein thrombosis) or in your lungs (pulmonary embolism) and to reduce the risk of them occurring again.  What do you need to know about Xarelto? The starting dose is one 15 mg tablet taken TWICE daily with food for the FIRST 21 DAYS then on (enter date) 4/9  the dose is changed to one 20 mg tablet taken ONCE A DAY with your evening meal.  DO NOT stop taking Xarelto without talking to the health care provider who prescribed the medication.  Refill your prescription for 20 mg tablets before you run out.  After discharge, you should have regular check-up appointments with your healthcare provider that is prescribing your Xarelto.  In the future your dose may need to be changed if your kidney function changes by a significant amount.  What do you do if you miss a dose? If you are taking Xarelto TWICE DAILY and you miss a dose, take it as soon as you remember. You may take two 15 mg tablets (total 30 mg) at the same time then resume your regularly scheduled 15 mg twice daily the next day.  If you are taking Xarelto ONCE DAILY and you miss a dose, take it as soon as you remember on the same day then continue your regularly scheduled once daily regimen the next day. Do not take two doses of Xarelto at the same time.   Important Safety Information Xarelto is a blood thinner medicine that can cause bleeding. You should call your healthcare provider right away if you experience any of the following:   Bleeding from an injury or your nose that does not stop.   Unusual colored urine (red or dark  brown) or unusual colored stools (red or black).   Unusual bruising for unknown reasons.   A serious fall or if you hit your head (even if there is no bleeding).  Some medicines may interact with Xarelto and might increase your risk of bleeding while on Xarelto. To help avoid this, consult your healthcare provider or pharmacist prior to using any new prescription or non-prescription medications, including herbals, vitamins, non-steroidal anti-inflammatory drugs (NSAIDs) and supplements.  This website has more information on Xarelto: [email or web address].xarelto.[email or web address].

## 2014-01-25 NOTE — ED Provider Notes (Signed)
I have personally seen and examined the patient.  I have discussed the plan of care with the resident.  I have reviewed the documentation on PMH/FH/Soc. History.  I have reviewed the documentation of the resident and agree.  Doug SouSam Jereld Presti, MD 01/25/14 1547

## 2014-01-26 NOTE — Telephone Encounter (Signed)
Called made pt aware of WM recs. Nothing further needed

## 2014-01-26 NOTE — Telephone Encounter (Signed)
Stop levaquin unless purulent sputum or fever now, in which case finish it. Most of his problem was probably PE, not pna

## 2014-01-29 ENCOUNTER — Ambulatory Visit (INDEPENDENT_AMBULATORY_CARE_PROVIDER_SITE_OTHER): Payer: BC Managed Care – PPO | Admitting: Internal Medicine

## 2014-01-29 ENCOUNTER — Encounter: Payer: Self-pay | Admitting: Internal Medicine

## 2014-01-29 VITALS — BP 170/100 | HR 110 | Temp 97.7°F | Ht 76.0 in | Wt 209.0 lb

## 2014-01-29 DIAGNOSIS — J449 Chronic obstructive pulmonary disease, unspecified: Secondary | ICD-10-CM

## 2014-01-29 DIAGNOSIS — I2699 Other pulmonary embolism without acute cor pulmonale: Secondary | ICD-10-CM

## 2014-01-29 DIAGNOSIS — I10 Essential (primary) hypertension: Secondary | ICD-10-CM | POA: Insufficient documentation

## 2014-01-29 DIAGNOSIS — J9610 Chronic respiratory failure, unspecified whether with hypoxia or hypercapnia: Secondary | ICD-10-CM

## 2014-01-29 NOTE — Assessment & Plan Note (Signed)
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

## 2014-01-29 NOTE — Patient Instructions (Signed)
Add 02 with any activity that you know makes you short of breath but always at bedtime at a flow of 2lpm   Please schedule a follow up office visit in 2 weeks, sooner if needed with cxr to see [name] with all meds in hand ,

## 2014-01-29 NOTE — Assessment & Plan Note (Signed)
-   HFA 75% p coaching 03/01/12 > 90% 01/23/2013    - PFT's 12/02/2011  FEV1  0.94 (24%) ratio 39 -  18% response to B 2 and DLCO 60%    -Alpha 1 genotype sent 03/01/12 >  MM  -07/14/2013 Med calendar , 10/24/2013  - self titration of steroids with ceiling of 20/ floor of zero rec 11/07/13 > did not follow action plan 12/11/2013 > repeated same 01/19/14  Adequate control on present rx, reviewed > no change in rx needed

## 2014-01-29 NOTE — Assessment & Plan Note (Signed)
rec avoid salt, minimize pred as per copd recs,  f/u with Dr Mendel RyderH Smith planned for 02/01/14> keep appt

## 2014-01-29 NOTE — Progress Notes (Signed)
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 Minimally better from albuterol.  Much better resp symptoms p prednisone for cluster ha, def improvement in head congestion, also less cough with prednsione.   cc having more cough at hs > white mucus, freq exac in am's, better p coffeee and gets around to taking am meds only an hour or two p rising. rec Stop foradil Start symbicort 160  Take 2 puffs first thing in am and then another 2 puffs about 12 hours later.  Work on inhaler technique:  .    Prednisone 10 mg take  4 each am x 2 days,   2 each am x 2 days,  1 each am x2days and stop Please schedule a follow up office visit in 4 weeks, sooner if needed with pft's   12/02/2011 f/u ov/Tresha Muzio cc better on prednisone then some worse off it but overall  Better vs baseline doe. No cough rec Dulera 200 Take 2 puffs first thing in am and then another 2 puffs about 12 hours later and if you like it fill the prescription  Continue spiriva each am only - take this immediately after dulera  Work on [employer]ainting perfect inhaler technique      02/19/2012  post hospital followup. Patient was admitted March 27 of 02/06/2012 for a COPD exacerbation. He was treated with IV antibiotics, nebulized bronchodilators, IV steroids. He was discharged on doxycycline and a steroid taper. Chest x-ray showed COPD changes without any acute process. He was changed off his Spiriva to Atrovent nebulizer. He was restarted on Dulera  2 puffs twice daily.  Since discharge.  Patient feels improved w/ decreased cough and congestion .  He has not smoked x 4 weeks. He has finished all his antibiotics and steroids. He will not be able to use atrovent neb due to work schedule.  rec Restart Spiriva  1 puff daily  Stop Atrovent NEB  For Rescue USE ONLY::Try to rest first then if not improved may go to inhaler or neb  May use Xopenex Inhaler 2 puffs every 4 hrs As needed  Wheezing-/trouble breathing - this is your rescue inhaler. -away from home  May use Albuterol NEB every 4 hr as needed for wheezing , trouble breathing - at home  Continue on Dulera 2 puffs Twice daily     03/01/2012 f/u ov/Jinna Weinman cc breathing tends worse daily (vs on prednisone) toward end of shift (2pm to 830 pm).  Wakes up each am with rattling producing nothing @  Nl wake up time and not much albuterol use daytime in any form.  Prev aecopd ? Due to change to foradil instead of maintaining dulera and increased cig use which continues  "no more than 1-2 per week" rec Prednisone 10 mg take  4 each am x 2 days,   2 each am x 2 days,  1 each am x2days and stop  Stop smoking completely before smoking completely stops you! Only use your albuterol (plan B  xopenex, Plan C is nebulizer) as a rescue medication o be used up to every 4 hours if needed  Work on perfecting  inhaler technique   04/20/2012 f/u ov/Lanitra Battaglini last cigarette 02/2012 overall no change doe and cough, no purulent sputum rec Congratulations on not smoking - it's the most important aspect of your care  For cough > mucinex 600 up to  2 every 12 hours For breathing xopenex hfa(your inhaler) up to 2 puffs every to 4 hours and only use the nebulizer if can't catch your breathing after the inhaler   04/25/2013 f/u ov/Josiah Nieto re copd Chief Complaint  Patient presents with  . Follow-up    Breathing some worse with hot/humid weather. No other new co's today.    Mowed front yard self propelled without stopping and then had to stop every few minutes  on  back yard but still  rarely using saba  rec Dulera and Spiriva are Plan A = automatic =  take these "no matter what" Only use your albuterol (plan B Proaire/  Plan C is the nebulizer)  as   rescue medication to be used if you can't catch your breath by resting or doing a relaxed purse lip breathing pattern. The less you use it, the better it will work when you need it. Ok to use up to every 4 hours if needed  For cough and congestion > mucinex up to 1200 mg every 12 hours.  06/13/2013 acute  ov/Augustino Savastano re ? [name] Chief Complaint  Patient presents with  . Acute Visit    increased SOB, rattling in chest, cough - prod in the early mornings.  Fever up to  100.8 in the evenings.    woke up 8/3  Feeling bad with purulent sputm then febrile pm 8/3 and 8/4 no rigors, min increase in saba use just as hfa, not neb. >zpack / pred   06/23/13 Acute OV  pt reports breathing had improved until 8/14  he woke up with SOB, prod cough.  still having some DOE, prod cough with white foamy mucus, chest tightness, wheezing.  CXR> bilateral LL opacities L>R  Levaquin 750mg  daily for 7 days Mucinex DM Twice daily  As needed  Cough/congestion  Prednisone taper over next week  06/28/2013 f/u ov/Jeffren Dombek f/u ? pna Chief Complaint  Patient presents with  . 1 Week ROV    Reports severe coughing, chest tightness, clear mucus production. Had CXR this morning.  fever gone, cough still severe > clear mucus better than it was Breathing better 8/18 then worse since with increases need for saba but no rsting sob  >>Pred taper   07/14/2013 Follow up and med review  Pt returns for follow up for PNA and med review  We reviewed all his medications and organized them into a med calendar  With pt education  Appears to be taking meds correctly.  Was treated for a LLL PNA , has finished 7 days of Levaquin 750mg . Feeling better with less cough and wheezing .  No fever, hemoptysis, chest pain or orthopnea.   CXR > COPD. Improving but  persistent density in the left lung base. Recommend continued followup  10/02/13 [name] aecopd x sev days  rec Prednisone 10 mg -Take 4 tabs  daily with food x 4 days, then 3 tabs daily x 4 days, then 2 tabs daily x 4 days, then 1 tab daily x4 days then stop. #40 Stay on dulera & spiriva Albuterol nebs thrice daily as needed  10/04/13  Augmentin 875 mg take one pill twice daily  X 10 days - take at breakfast and supper with large glass of water.   Albuterol neb up to every3 hours and if not comforable at rest p neb go to ER     10/09/2013 f/u ov/Khiana Camino re: aecopd/ no med calendar/ confused with action plan Chief Complaint  Patient presents with  . Acute Visit    Pt c/o increased SOB, chest tightness and chest congestion x 1 wk. He states gets out of breath just standing up or walking a few steps.   Ok at rest RA Last neb 3  h prior to OV . No purulent secretions on augmentin since 11/26 Mint def makes breathing worse >>pred taper   10/24/2013 Follow up and Med review  Patient returns for a two-week followup and medication review. We reviewed all his medications and organized them into a medication calendar with patient education. Patient appears to be taking his medications correctly. Patient has been having a slow to resolve COPD, exacerbation. He was treated with Augmentin  Steroid taper. 3 weeks ago. Patient reports symptoms only minimally improved. He is currently on prednisone 20 mg. Patient was also called in a Z-Pak 10 days ago. Patient reports he did not have any improvement with Z-Pak. He continues to have sinus congestion, drainage, productive cough with rattling mucus. Patient is using Mucinex DM and flutter valve without any help.  Patient reports that he takes his Dulera  and Spiriva on a consistent basis without any missed doses. Says his wheezing and cough worse in evening.  CT chest done last week showed scattered small subpleural nodules including some in  the right  upper lobe which have a somewhat tree-in-bud distribution, suggesting a postinflammatory process. No highly suspicious nodules rec Levaquin  daily for 7 days  Mucinex DM Twice daily  As needed  Cough/congestion  Continue on Prednisone  daily until seen back in office   11/07/2013 f/u ov/Rashun Grattan re: aecopd still not back to baseline/ never went to ER Chief Complaint  Patient presents with  . Follow-up    Cough has improved some- still coughing up minimal white sputum.  SOB much better but not quite back at his normal baseline.   walk x sev hundred feet  Still some congested cough pred still at 20 mg per day  Much less saba dep and no longer needing neb form, just the hfa  rec As per med calendar, when need nebulizer as rescue, take prednisone 20 mg per day until no need, then 10 mg per day x 5 d and stop See calendar for specific medication   12/11/2013 f/u ov/Arbor Cohen re: ran out of prednisone x sev weeks prior to OV and not compliant with gerd rx Chief Complaint  Patient presents with  . Acute Visit    Pt c/o increased SOB x 6 days- with nausea and vomiting at onset. He also has increased prod cough with white sputum. He has used x 2 in the past wk and is using rescue inhaler at least once per day.   used neb avg once daily since onset of worse breathing  But did not activate the prednisone action plan No discolored mucus  No med calendar  >>pred taper , CXR w/ no acute process.   01/05/2014 Acute OV  Complains of  increased SOB, wheezing, chest tightness, prod cough with white mucus x 3 days.  Denies hemoptysis, nausea, vomiting, edema, f/c/s. , body aches , or  orthopnea.  Started taking 20mg  prednisone few days ago, started steroids x 3 weeks ago ,tried to taper down but symptoms worsen on lower doses of prednisone.  Complains of increased anxiety and panic attacks. Has been using albuterol nebulizer with some relief. rec Doxycyline 100mg  Twice daily  For 7 days  Mucinex DM  Twice daily  As needed  Cough/congestion  Prednisone 40mg  daily for 4 days, then 30mg  daily for 4 days then back to 20mg  tapering dose according to med calendar.  Xanax 0.25mg  daily As needed  Anxiety . May make you sleepy.  Follow up in 2 -3 weeks as planned  and As needed   Fluids and rest  Tylenol As needed  Fever.  NO NSAIDS -advil, ibuprofen , aleve, etc.    Admit date: 01/10/2014  Discharge date: 01/13/2014  Recommendations for Outpatient Follow-up:  1. Please continue spiriva and symbicort as prior to this admission. May also use nebulizer treatments as prescribed .  2. Continue Levaquin for 4 more days on discharge.  3. Taper prednisone starting from 50 mg a day, taper down by 5 mg a day down to 0 mg and then stop.  4. Please make an appt with cardiology to have outpatient studies done; phone number provided in follow up section of AVS.  Discharge Diagnoses:  Active Problems:  COPD with acute exacerbation  COPD exacerbation  DM (diabetes mellitus)  NSTEMI (non-ST elevated myocardial infarction)  Acute cor pulmonale    01/19/2014 post hosp f/u ov/Vietta Bonifield re: re-establish / continuity of care/ no longer smoking Chief Complaint  Patient presents with  . Follow-up    [facility]  Pt c/o fever, SOB, chest tightness.  mucus thick and white No neb needed yet but still on 30 mg per day prednisone  Sob with more than sitting still. rec Prednisone ceiling is 20 mg per day and the floor is 10 mg daily for now See calendar  CTa 01/23/14 > multiple PE  01/29/2014 post hosp f/u ov/Katiya Fike re: copd/ pe  Chief Complaint  Patient presents with  . HFU    Pt c/o chest tightness and increased DOE since this am. Had to use neb this am. He has minimal cough with clear sputum.  He has been using neb 1 to 2 times per day.     Using 20 mg pred per day since needing neb, no purulent sputum or hemoptysis, off abx x 1 week  No obvious day to day or daytime  variabilty or assoc cp or chest tightness, subjective wheeze overt sinus or hb symptoms. No unusual exp hx or h/o childhood pna/ asthma or knowledge of premature birth.  Sleeping ok without nocturnal  or early am exacerbation  of respiratory  c/o's or need for noct saba. Also denies any obvious fluctuation of symptoms with weather or environmental changes or other aggravating or alleviating factors except as outlined above   Current Medications, Allergies, Complete Past Medical History, Past Surgical History, Family History, and Social History were [facility] electronic [facility].  ROS  The following are not active complaints unless bolded sore throat, dysphagia, dental problems, itching, sneezing,  nasal congestion or excess/ purulent secretions, ear ache,   fever, chills, sweats, unintended wt loss, pleuritic or exertional cp, hemoptysis,  orthopnea pnd or leg swelling, presyncope, palpitations, heartburn, abdominal pain, anorexia, nausea, vomiting, diarrhea  or change in bowel or urinary habits, change in stools or urine, dysuria,hematuria,  rash, arthralgias, visual  complaints, headache, numbness weakness or ataxia or problems with walking or coordination,  change in mood/affect or memory.          Objective:        Wt 188 01/09/12 >  07/27/2012  194 > 196 10/24/2012 > 01/23/2013  202> 200 04/25/2013 > 194 06/13/2013 >164 8/15 > 06/28/2013  191 >199 07/14/2013  >208 10/24/2013 > 11/07/2013  214 > 12/11/2013 208 > 01/19/2014 211 > 01/29/2014 209   Amb, nad off 02 - brought it but not wearing    HEENT mild turbinate edema.  Oropharynx no thrush or excess pnd or cobblestoning.  No JVD or cervical adenopathy. Mild accessory muscle hypertrophy. Trachea midline, nl thryroid.  No Exp wheezing , no rhonchi, or crackles   Regular rate and rhythm without murmur gallop or rub or increase P2 or 1+ pitting bilateral pitting edema.  Abd: no hsm, nl excursion. Ext warm without cyanosis or clubbing.          CXR  01/19/2014 :  Prominent new infiltrate right upper lobe consistent with pneumonia CTa 01/23/14 > multiple PE       Assessment & Plan:

## 2014-01-29 NOTE — Assessment & Plan Note (Signed)
-   See CTa 01/23/2014 > rx xarelto - Venous dopplers 01/24/14 > neg  Clearly improved on xarelto > no evidence of bleeding > continue indefinitely based on severity of PAH and risk of recurrent/ catastrophic PE

## 2014-01-30 ENCOUNTER — Telehealth: Payer: Self-pay | Admitting: Internal Medicine

## 2014-01-30 DIAGNOSIS — I10 Essential (primary) hypertension: Secondary | ICD-10-CM

## 2014-01-30 MED ORDER — BISOPROLOL FUMARATE 5 MG PO TABS: ORAL_TABLET | ORAL | Status: DC

## 2014-01-30 NOTE — Telephone Encounter (Signed)
[facility] - She states that pt has an irregular heartbeat and wasn't sure if this was something new or not.  Has some chest tightness off and on but is relieved with nebulizer - SOB is about the same - Has appt with Dr Allyson SabalBerry on 02/01/14 for post hosp.  Please advise

## 2014-01-30 NOTE — Telephone Encounter (Signed)
Spoke with pt and advised of MW recommendations regarding starting Bisoprolol.  Rx sent.

## 2014-01-30 NOTE — Telephone Encounter (Signed)
Start bisoprolol 2.5 mg daily pending cards input

## 2014-02-01 ENCOUNTER — Ambulatory Visit (INDEPENDENT_AMBULATORY_CARE_PROVIDER_SITE_OTHER): Payer: BC Managed Care – PPO | Admitting: Cardiovascular Disease

## 2014-02-01 ENCOUNTER — Encounter: Payer: Self-pay | Admitting: Cardiovascular Disease

## 2014-02-01 VITALS — BP 126/70 | HR 80 | Ht 76.0 in | Wt 210.0 lb

## 2014-02-01 DIAGNOSIS — I214 Non-ST elevation (NSTEMI) myocardial infarction: Secondary | ICD-10-CM

## 2014-02-01 DIAGNOSIS — I10 Essential (primary) hypertension: Secondary | ICD-10-CM

## 2014-02-01 DIAGNOSIS — I2699 Other pulmonary embolism without acute cor pulmonale: Secondary | ICD-10-CM

## 2014-02-01 DIAGNOSIS — R0602 Shortness of breath: Secondary | ICD-10-CM

## 2014-02-01 NOTE — Assessment & Plan Note (Signed)
The patient was hospitalized earlier this month with a COPD flare. He ultimately evolved a non-STEMI with a peak troponin of 1. He was seen in the hospital by Dr. Simona HuhSam McDowell. His EKG showed an incomplete right bundle branch block and sinus tachycardia. It was thought that he had a2 STEMI related to demand ischemia. He does have a factors including greater than 50 pack years of tobacco abuse. He had a 2-D echo that was normal except for mildly dilated right ventricle and a flattened septum echo. I am going  to get a dobutamine Myoview stress test to rule out an ischemic etiology.

## 2014-02-01 NOTE — Progress Notes (Signed)
02/01/2014 Theodore BlalockJames W [name]   07/07/1956  [id number]  Primary Physician No PCP Per Patient Primary Cardiologist: Theodore GessJonathan J. Ermelinda Eckert MD Theodore RenoFACP,FACC,FAHA, FSCAI   HPI:  [name] is a very pleasant 58 year old mildly overweight divorced Caucasian male with no children who has been working doing Personnel officerfood service at [employer]. He has no primary care physician currently. His cardiac risk factors include greater than 60-pack-year history of tobacco abuse. He was diagnosed with COPD back in 1994. He does not have a history of hypertension or diabetes. Profile is excellent for primary prevention. His father did have bypass surgery in his 2680s. The patient has never had a heart attack or stroke. He had an admission earlier this month for a COPD flare. His cardiac enzymes/troponin went up to 1 thought to be related type II non-STEMI. His EKG showed sinus tachycardia with incomplete left Anspach and 2-D echo revealed normal LV systolic function with a mildly dilated right ventricle. He was admitted a week later with recurrent shortness of breath at which time a CT angiogram revealed bilateral pulmonary emboli. He was placed on oral anticoagulation.   Current Outpatient Prescriptions  Medication Sig Dispense Refill  . acetaminophen (TYLENOL) 500 MG tablet Take 1,000 mg by mouth every 8 (eight) hours as needed for mild pain or fever.      Marland Kitchen. albuterol (PROVENTIL HFA;VENTOLIN HFA) 108 (90 BASE) MCG/ACT inhaler Inhale 1-2 puffs into the lungs every 6 (six) hours as needed for wheezing or shortness of breath.      Marland Kitchen. albuterol (PROVENTIL) (2.5 MG/3ML) 0.083% nebulizer solution Take 2.5 mg by nebulization every 6 (six) hours as needed for wheezing or shortness of breath.      . ALPRAZolam (XANAX) 0.25 MG tablet Take 0.25 mg by mouth 2 (two) times daily as needed for anxiety.      Marland Kitchen. aspirin 81 MG chewable tablet Chew 81 mg by mouth daily.      . bisoprolol (ZEBETA) 5 MG tablet Take 1/2 tablet daily  15  tablet  3  . famotidine (PEPCID) 20 MG tablet Take 20 mg by mouth at bedtime.      . fluticasone (FLONASE) 50 MCG/ACT nasal spray Place 2 sprays into both nostrils daily.      Marland Kitchen. guaiFENesin (MUCINEX) 600 MG 12 hr tablet Take 600 mg by mouth 2 (two) times daily.      . mometasone-formoterol (DULERA) 200-5 MCG/ACT AERO Inhale 2 puffs into the lungs 2 (two) times daily.      . nicotine polacrilex (COMMIT) 2 MG lozenge Take 2 mg by mouth as needed for smoking cessation.      . OXYGEN-HELIUM IN Inhale 2 L into the lungs continuous.      . pantoprazole (PROTONIX) 40 MG tablet Take 40 mg by mouth daily.      . predniSONE (DELTASONE) 10 MG tablet Take 10 mg by mouth daily with breakfast.      . Rivaroxaban (XARELTO) 15 MG TABS tablet Take 15 mg BID for 21 days (fisrt dose was  3-18)  then take 20 mg daily.  41 tablet  0  . sodium chloride (OCEAN) 0.65 % SOLN nasal spray Place 2 sprays into the nose as needed for congestion.      Marland Kitchen. tiotropium (SPIRIVA) 18 MCG inhalation capsule Place 18 mcg into inhaler and inhale daily.      . Rivaroxaban (XARELTO) 20 MG TABS tablet Take 1 tablet (20 mg total) by mouth daily with supper.  30 tablet  1  . [DISCONTINUED] budesonide-formoterol (SYMBICORT) 160-4.5 MCG/ACT inhaler Take 2 puffs first thing in am and then another 2 puffs about 12 hours later.     1 Inhaler  12   No current facility-administered medications for this visit.    Allergies  Allergen Reactions  . Naproxen Sodium Itching    History   Social History  . Marital Status: Divorced    Spouse Name: N/A    Number of Children: 0  . Years of Education: N/A   Occupational History  .  [employer]   Social History Main Topics  . Smoking status: Former Smoker -- 2.00 packs/day for 35 years    Types: Cigarettes    Quit date: 01/24/2012  . Smokeless tobacco: Never Used  . Alcohol Use: Yes     Comment: 01/24/2014 "holidays; family birthdays"  . Drug Use: No  . Sexual Activity: No    Other Topics Concern  . Not on file   Social History Narrative  . No narrative on file     Review of Systems: General: negative for chills, fever, night sweats or weight changes.  Cardiovascular: negative for chest pain, dyspnea on exertion, edema, orthopnea, palpitations, paroxysmal nocturnal dyspnea or shortness of breath Dermatological: negative for rash Respiratory: negative for cough or wheezing Urologic: negative for hematuria Abdominal: negative for nausea, vomiting, diarrhea, bright red blood per rectum, melena, or hematemesis Neurologic: negative for visual changes, syncope, or dizziness All other systems reviewed and are otherwise negative except as noted above.    Blood pressure 126/70, pulse 80, height 6\' 4"  (1.93 m), weight 210 lb (95.255 kg).  General appearance: alert and no distress Neck: no adenopathy, no carotid bruit, no JVD, supple, symmetrical, trachea midline and thyroid not enlarged, symmetric, no tenderness/mass/nodules Lungs: clear to auscultation bilaterally Heart: regular rate and rhythm, S1, S2 normal, no murmur, click, rub or gallop Abdomen: soft, non-tender; bowel sounds normal; no masses,  no organomegaly Extremities: extremities normal, atraumatic, no cyanosis or edema and 2+ pedal pulses  without edema  EKG sizes of 82 without ST or T wave changes  ASSESSMENT AND PLAN:   NSTEMI (non-ST elevated myocardial infarction) The patient was hospitalized earlier this month with a COPD flare. He ultimately evolved a non-STEMI with a peak troponin of 1. He was seen in the hospital by Dr. Simona Huh. His EKG showed an incomplete right bundle branch block and sinus tachycardia. It was thought that he had a2 STEMI related to demand ischemia. He does have a factors including greater than 50 pack years of tobacco abuse. He had a 2-D echo that was normal except for mildly dilated right ventricle and a flattened septum echo. I am going  to get a dobutamine Myoview  stress test to rule out an ischemic etiology.  Pulmonary embolism Patient was recently admitted one week ago with recurrent shortness of breath. A chest CT showed bilateral pulmonary emboli. Venous Dopplers were negative for DVT. The patient was placed on Xarelto oral anticoagulation. He is on chronic O2.Dr. Sandrea Hughs follows him as an outpatient for his COPD and pulmonary embolus.      Theodore Gess MD Theodore [name],FACC,FAHA, [facility] 02/01/2014 9:32 AM

## 2014-02-01 NOTE — Patient Instructions (Signed)
  We will see you back in follow up in 6 months with an extender and 1 year with Dr Allyson SabalBerry.   Dr Allyson SabalBerry has ordered :  a dobutamine myoview. For furth information please visit [email or web address]. Please follow instruction sheet, as given.

## 2014-02-01 NOTE — Assessment & Plan Note (Signed)
Patient was recently admitted one week ago with recurrent shortness of breath. A chest CT showed bilateral pulmonary emboli. Venous Dopplers were negative for DVT. The patient was placed on Xarelto oral anticoagulation. He is on chronic O2.Dr. Sandrea HughsMichael Wert follows him as an outpatient for his COPD and pulmonary embolus.

## 2014-02-07 ENCOUNTER — Telehealth (HOSPITAL_COMMUNITY): Payer: Self-pay

## 2014-02-13 ENCOUNTER — Ambulatory Visit: Payer: BC Managed Care – PPO | Admitting: Adult Health

## 2014-02-13 ENCOUNTER — Ambulatory Visit (HOSPITAL_COMMUNITY)
Admission: RE | Admit: 2014-02-13 | Discharge: 2014-02-13 | Disposition: A | Discharge status: Home / Self Care | Payer: BC Managed Care – PPO | Source: Ambulatory Visit | Attending: Internal Medicine | Admitting: Internal Medicine

## 2014-02-13 DIAGNOSIS — Z8249 Family history of ischemic heart disease and other diseases of the circulatory system: Secondary | ICD-10-CM | POA: Insufficient documentation

## 2014-02-13 DIAGNOSIS — I4949 Other premature depolarization: Secondary | ICD-10-CM | POA: Insufficient documentation

## 2014-02-13 DIAGNOSIS — I252 Old myocardial infarction: Secondary | ICD-10-CM | POA: Insufficient documentation

## 2014-02-13 DIAGNOSIS — I4510 Unspecified right bundle-branch block: Secondary | ICD-10-CM | POA: Insufficient documentation

## 2014-02-13 DIAGNOSIS — R0602 Shortness of breath: Secondary | ICD-10-CM | POA: Insufficient documentation

## 2014-02-13 DIAGNOSIS — J4489 Other specified chronic obstructive pulmonary disease: Secondary | ICD-10-CM | POA: Insufficient documentation

## 2014-02-13 DIAGNOSIS — R0609 Other forms of dyspnea: Secondary | ICD-10-CM | POA: Insufficient documentation

## 2014-02-13 DIAGNOSIS — J449 Chronic obstructive pulmonary disease, unspecified: Secondary | ICD-10-CM | POA: Insufficient documentation

## 2014-02-13 DIAGNOSIS — E663 Overweight: Secondary | ICD-10-CM | POA: Insufficient documentation

## 2014-02-13 DIAGNOSIS — R0989 Other specified symptoms and signs involving the circulatory and respiratory systems: Principal | ICD-10-CM | POA: Insufficient documentation

## 2014-02-13 MED ORDER — TECHNETIUM TC 99M SESTAMIBI GENERIC - CARDIOLITE
30.0000 | Freq: Once | INTRAVENOUS | Status: AC | PRN
  Administered 2014-02-13: 30 via INTRAVENOUS

## 2014-02-13 MED ORDER — DOBUTAMINE INFUSION FOR EP/ECHO/NUC (1000 MCG/ML)
0.0000 ug/kg/min | INTRAVENOUS | Status: DC
  Administered 2014-02-13: 20 ug/kg/min via INTRAVENOUS

## 2014-02-13 MED ORDER — TECHNETIUM TC 99M SESTAMIBI GENERIC - CARDIOLITE
10.0000 | Freq: Once | INTRAVENOUS | Status: AC | PRN
  Administered 2014-02-13: 10 via INTRAVENOUS

## 2014-02-13 NOTE — Procedures (Addendum)
[facility] [address] [place] [address] [phone]  Cardiology Nuclear Med Study  Lind GuestJames W Streng is a 58 y.o. male     MRN : [id number]     DOB: September 04, 1956  Procedure Date: 02/13/2014  Nuclear Med Background Indication for Stress Test:  Evaluation for Ischemia and Post Hospital History:  Asthma, COPD and MI;ST;ECHO-01/24/14;EF=60-65% Cardiac Risk Factors: Family History - CAD, History of Smoking, Overweight, RBBB and 01/2014-bilateral PE  Symptoms:  DOE and SOB   Nuclear Pre-Procedure Caffeine/Decaff Intake:  7:00pm NPO After: 7:00am   IV Site: R Forearm  IV 0.9% NS with Angio Cath:  22g  Chest Size (in):  44"  IV Started by: Emmit PomfretAmanda Hicks, RN  Height: 6\' 4"  (1.93 m)  Cup Size: n/a  BMI:  Body mass index is 25.57 kg/(m^2). Weight:  210 lb (95.255 kg)   Tech Comments:  n/a    Nuclear Med Study 1 or 2 day study: 1 day  Stress Test Type:  Dobutamine  Order Authorizing Provider:  Nanetta BattyJonathan Berry, MD   Resting Radionuclide: Technetium 264m Sestamibi  Resting Radionuclide Dose: 10.2 mCi   Stress Radionuclide:  Technetium 664m Sestamibi  Stress Radionuclide Dose: 30.6 mCi           Stress Protocol Rest HR: 69 Stress HR: 153  Rest BP: 149/94 Stress BP: 176/99  Exercise Time (min): n/a METS: n/a          Dose of Adenosine (mg):  n/a Dose of Lexiscan: n/a mg  Dose of Atropine (mg): n/a Dose of Dobutamine: 20 mcg/kg/min (at max HR)  Stress Test Technologist: Ernestene MentionGwen Farrington, CCT Nuclear Technologist: Koren Shiverobin Moffitt, CNMT   Rest Procedure:  Myocardial perfusion imaging was performed at rest 45 minutes following the intravenous administration of Technetium 2064m Sestamibi. Stress Procedure:  The patient received IV dobutamine and no IV atropine.  There were no significant changes with infusion.  Technetium 7964m Sestamibi was injected IV at peak heart rate and quantitative spect images were obtained after a 45 minute  delay.  Transient Ischemic Dilatation (Normal <1.22):  1.02 Lung/Heart Ratio (Normal <0.45):  0.27 QGS EDV:  n/a ml QGS ESV:  n/a ml LV Ejection Fraction: Study not gated  Rest ECG: NSR - Normal EKG  Stress ECG: No significant ST segment change suggestive of ischemia.  QPS Raw Data Images:  Normal; no motion artifact; normal heart/lung ratio. Stress Images:  Fixed inferoseptal defect Rest Images:  Fixed inferoseptal defect Subtraction (SDS):  No evidence of ischemia.  Impression Exercise Capacity:  Dobutamine study with no exercise. BP Response:  Normal blood pressure response. Clinical Symptoms:  No significant symptoms noted. ECG Impression:  No significant ST segment change suggestive of ischemia. Scattered PVC's. Comparison with Prior Nuclear Study: No previous nuclear study performed  Overall Impression:  Low to intermediate risk stress test with a fixed inferoseptal defect - EF and wall motion is not known as the study is non-gated  LV Wall Motion:  Non-gated  Chrystie NoseKenneth C. Ameah Chanda, MD, [facility]ACC Board Certified in Nuclear Cardiology Attending Cardiologist [facility]  Chrystie NoseHILTY,Britton Bera C, MD  02/13/2014 1:29 PM

## 2014-02-16 ENCOUNTER — Ambulatory Visit (INDEPENDENT_AMBULATORY_CARE_PROVIDER_SITE_OTHER): Payer: BC Managed Care – PPO | Admitting: Internal Medicine

## 2014-02-16 ENCOUNTER — Encounter: Payer: Self-pay | Admitting: Internal Medicine

## 2014-02-16 ENCOUNTER — Ambulatory Visit (INDEPENDENT_AMBULATORY_CARE_PROVIDER_SITE_OTHER)
Admission: RE | Admit: 2014-02-16 | Discharge: 2014-02-16 | Disposition: A | Discharge status: Home / Self Care | Payer: BC Managed Care – PPO | Source: Ambulatory Visit | Attending: Internal Medicine | Admitting: Internal Medicine

## 2014-02-16 VITALS — BP 112/62 | HR 73 | Temp 98.4°F | Ht 76.0 in | Wt 218.0 lb

## 2014-02-16 DIAGNOSIS — I2609 Other pulmonary embolism with acute cor pulmonale: Secondary | ICD-10-CM

## 2014-02-16 DIAGNOSIS — J9610 Chronic respiratory failure, unspecified whether with hypoxia or hypercapnia: Secondary | ICD-10-CM

## 2014-02-16 DIAGNOSIS — I2699 Other pulmonary embolism without acute cor pulmonale: Secondary | ICD-10-CM

## 2014-02-16 DIAGNOSIS — J449 Chronic obstructive pulmonary disease, unspecified: Secondary | ICD-10-CM

## 2014-02-16 MED ORDER — TRIAMTERENE-HCTZ 37.5-25 MG PO TABS: ORAL_TABLET | ORAL | Status: DC

## 2014-02-16 NOTE — Progress Notes (Signed)
Quick Note:  Spoke with pt and notified of results per Dr. Wert. Pt verbalized understanding and denied any questions.  ______ 

## 2014-02-16 NOTE — Progress Notes (Signed)
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 Minimally better from albuterol.  Much better resp symptoms p prednisone for cluster ha, def improvement in head congestion, also less cough with prednsione.   cc having more cough at hs > white mucus, freq exac in am's, better p coffeee and gets around to taking am meds only an hour or two p rising. rec Stop foradil Start symbicort 160  Take 2 puffs first thing in am and then another 2 puffs about 12 hours later.  Work on inhaler technique:  .    Prednisone 10 mg take  4 each am x 2 days,   2 each am x 2 days,  1 each am x2days and stop Please schedule a follow up office visit in 4 weeks, sooner if needed with pft's   12/02/2011 f/u ov/[name] cc better on prednisone then some worse off it but overall  Better vs baseline doe. No cough rec Dulera 200 Take 2 puffs first thing in am and then another 2 puffs about 12 hours later and if you like it fill the prescription  Continue spiriva each am only - take this immediately after dulera  Work on [employer]maintainting perfect inhaler technique      02/19/2012  post hospital followup. Patient was admitted March 27 of 02/06/2012 for a COPD exacerbation. He was treated with IV antibiotics, nebulized bronchodilators, IV steroids. He was discharged on doxycycline and a steroid taper. Chest x-ray showed COPD changes without any acute process. He was changed off his Spiriva to Atrovent nebulizer. He was restarted on Dulera  2 puffs twice daily.  Since discharge.  Patient feels improved w/ decreased cough and congestion .  He has not smoked x 4 weeks. He has finished all his antibiotics and steroids. He will not be able to use atrovent neb due to work schedule.  rec Restart Spiriva  1 puff daily  Stop Atrovent NEB  For Rescue USE ONLY::Try to rest first then if not improved may go to inhaler or neb  May use Xopenex Inhaler 2 puffs every 4 hrs As needed  Wheezing-/trouble breathing - this is your rescue inhaler. -away from home  May use Albuterol NEB every 4 hr as needed for wheezing , trouble breathing - at home  Continue on Dulera 2 puffs Twice daily     03/01/2012 f/u ov/[name] cc breathing tends worse daily (vs on prednisone) toward end of shift (2pm to 830 pm).  Wakes up each am with rattling producing nothing @  Nl wake up time and not much albuterol use daytime in any form.  Prev aecopd ? Due to change to foradil instead of maintaining dulera and increased cig use which continues  "no more than 1-2 per week" rec Prednisone 10 mg take  4 each am x 2 days,   2 each am x 2 days,  1 each am x2days and stop  Stop smoking completely before smoking completely stops you! Only use your albuterol (plan B  xopenex, Plan C is nebulizer) as a rescue medication o be used up to every 4 hours if needed  Work on perfecting  inhaler technique   04/20/2012 f/u ov/Byrd Terrero last cigarette 02/2012 overall no change doe and cough, no purulent sputum rec Congratulations on not smoking - it's the most important aspect of your care  For cough > mucinex 600 up to  2 every 12 hours For breathing xopenex hfa(your inhaler) up to 2 puffs every to 4 hours and only use the nebulizer if can't catch your breathing after the inhaler   04/25/2013 f/u ov/[name] re copd Chief Complaint  Patient presents with  . Follow-up    Breathing some worse with hot/humid weather. No other new co's today.    Mowed front yard self propelled without stopping and then had to stop every few minutes  on  back yard but still  rarely using saba  rec Dulera and Spiriva are Plan A = automatic =  take these "no matter what" Only use your albuterol (plan B Proaire/  Plan C is the nebulizer)  as   rescue medication to be used if you can't catch your breath by resting or doing a relaxed purse lip breathing pattern. The less you use it, the better it will work when you need it. Ok to use up to every 4 hours if needed  For cough and congestion > mucinex up to 1200 mg every 12 hours.  06/13/2013 acute  ov/[name] re ? [name] Chief Complaint  Patient presents with  . Acute Visit    increased SOB, rattling in chest, cough - prod in the early mornings.  Fever up to  100.8 in the evenings.    woke up 8/3  Feeling bad with purulent sputm then febrile pm 8/3 and 8/4 no rigors, min increase in saba use just as hfa, not neb. >zpack / pred   06/23/13 Acute OV  pt reports breathing had improved until 8/14  he woke up with SOB, prod cough.  still having some DOE, prod cough with white foamy mucus, chest tightness, wheezing.  CXR> bilateral LL opacities L>R  Levaquin 750mg  daily for 7 days Mucinex DM Twice daily  As needed  Cough/congestion  Prednisone taper over next week  06/28/2013 f/u ov/[name] f/u ? pna Chief Complaint  Patient presents with  . 1 Week ROV    Reports severe coughing, chest tightness, clear mucus production. Had CXR this morning.  fever gone, cough still severe > clear mucus better than it was Breathing better 8/18 then worse since with increases need for saba but no rsting sob  >>Pred taper   07/14/2013 Follow up and med review  Pt returns for follow up for PNA and med review  We reviewed all his medications and organized them into a med calendar  With pt education  Appears to be taking meds correctly.  Was treated for a LLL PNA , has finished 7 days of Levaquin 750mg . Feeling better with less cough and wheezing .  No fever, hemoptysis, chest pain or orthopnea.   CXR > COPD. Improving but  persistent density in the left lung base. Recommend continued followup  10/02/13 [name] aecopd x sev days  rec Prednisone 10 mg -Take 4 tabs  daily with food x 4 days, then 3 tabs daily x 4 days, then 2 tabs daily x 4 days, then 1 tab daily x4 days then stop. #40 Stay on dulera & spiriva Albuterol nebs thrice daily as needed  10/04/13  Augmentin 875 mg take one pill twice daily  X 10 days - take at breakfast and supper with large glass of water.   Albuterol neb up to every3 hours and if not comforable at rest p neb go to ER     10/09/2013 f/u ov/Colvin Blatt re: aecopd/ no med calendar/ confused with action plan Chief Complaint  Patient presents with  . Acute Visit    Pt c/o increased SOB, chest tightness and chest congestion x 1 wk. He states gets out of breath just standing up or walking a few steps.   Ok at rest RA Last neb 3  h prior to OV . No purulent secretions on augmentin since 11/26 Mint def makes breathing worse >>pred taper   10/24/2013 Follow up and Med review  Patient returns for a two-week followup and medication review. We reviewed all his medications and organized them into a medication calendar with patient education. Patient appears to be taking his medications correctly. Patient has been having a slow to resolve COPD, exacerbation. He was treated with Augmentin  Steroid taper. 3 weeks ago. Patient reports symptoms only minimally improved. He is currently on prednisone 20 mg. Patient was also called in a Z-Pak 10 days ago. Patient reports he did not have any improvement with Z-Pak. He continues to have sinus congestion, drainage, productive cough with rattling mucus. Patient is using Mucinex DM and flutter valve without any help.  Patient reports that he takes his Dulera  and Spiriva on a consistent basis without any missed doses. Says his wheezing and cough worse in evening.  CT chest done last week showed scattered small subpleural nodules including some in  the right  upper lobe which have a somewhat tree-in-bud distribution, suggesting a postinflammatory process. No highly suspicious nodules rec Levaquin  daily for 7 days  Mucinex DM Twice daily  As needed  Cough/congestion  Continue on Prednisone  daily until seen back in office   11/07/2013 f/u ov/Kirk Sampley re: aecopd still not back to baseline/ never went to ER Chief Complaint  Patient presents with  . Follow-up    Cough has improved some- still coughing up minimal white sputum.  SOB much better but not quite back at his normal baseline.   walk x sev hundred feet  Still some congested cough pred still at 20 mg per day  Much less saba dep and no longer needing neb form, just the hfa  rec As per med calendar, when need nebulizer as rescue, take prednisone 20 mg per day until no need, then 10 mg per day x 5 d and stop See calendar for specific medication   12/11/2013 f/u ov/Tuesday Terlecki re: ran out of prednisone x sev weeks prior to OV and not compliant with gerd rx Chief Complaint  Patient presents with  . Acute Visit    Pt c/o increased SOB x 6 days- with nausea and vomiting at onset. He also has increased prod cough with white sputum. He has used x 2 in the past wk and is using rescue inhaler at least once per day.   used neb avg once daily since onset of worse breathing  But did not activate the prednisone action plan No discolored mucus  No med calendar  >>pred taper , CXR w/ no acute process.   01/05/2014 Acute OV  Complains of  increased SOB, wheezing, chest tightness, prod cough with white mucus x 3 days.  Denies hemoptysis, nausea, vomiting, edema, f/c/s. , body aches , or  orthopnea.  Started taking 20mg  prednisone few days ago, started steroids x 3 weeks ago ,tried to taper down but symptoms worsen on lower doses of prednisone.  Complains of increased anxiety and panic attacks. Has been using albuterol nebulizer with some relief. rec Doxycyline 100mg  Twice daily  For 7 days  Mucinex DM  Twice daily  As needed  Cough/congestion  Prednisone 40mg  daily for 4 days, then 30mg  daily for 4 days then back to 20mg  tapering dose according to med calendar.  Xanax 0.25mg  daily As needed  Anxiety . May make you sleepy.  Follow up in 2 -3 weeks as planned  and As needed   Fluids and rest  Tylenol As needed  Fever.  NO NSAIDS -advil, ibuprofen , aleve, etc.    Admit date: 01/10/2014  Discharge date: 01/13/2014  Recommendations for Outpatient Follow-up:  1. Please continue spiriva and symbicort as prior to this admission. May also use nebulizer treatments as prescribed .  2. Continue Levaquin for 4 more days on discharge.  3. Taper prednisone starting from 50 mg a day, taper down by 5 mg a day down to 0 mg and then stop.  4. Please make an appt with cardiology to have outpatient studies done; phone number provided in follow up section of AVS.  Discharge Diagnoses:  Active Problems:  COPD with acute exacerbation  COPD exacerbation  DM (diabetes mellitus)  NSTEMI (non-ST elevated myocardial infarction)  Acute cor pulmonale    01/19/2014 post hosp f/u ov/Lyndie Vanderloop re: re-establish / continuity of care/ no longer smoking Chief Complaint  Patient presents with  . Follow-up    Pt was hospitalized at [facility] for respiratory distress.  Pt c/o fever, SOB, chest tightness.  mucus thick and white No neb needed yet but still on 30 mg per day prednisone  Sob with more than sitting still. rec Prednisone ceiling is 20 mg per day and the floor is 10 mg daily for now See calendar  CTa 01/23/14 > multiple PE  01/29/2014 post hosp f/u ov/Caison Hearn re: copd/ pe  Chief Complaint  Patient presents with  . HFU    Pt c/o chest tightness and increased DOE since this am. Had to use neb this am. He has minimal cough with clear sputum.  He has been using neb 1 to 2 times per day.     Using 20 mg pred per day since needing neb, no purulent sputum or hemoptysis, off abx x 1 week rec Add 02 with any activity that you  know makes you short of breath but always at bedtime at a flow of 2lpm     . 02/16/2014 f/u ov/Elinda Bunten re:  Copd/ 02 dep resp failure/ ? pulm infarct RUL Chief Complaint  Patient presents with  . Follow-up    Pt reports his breathinh has improved some. He has minimal cough- non prod and has "rattle in chest".  using pred 10 mg daily, dulera doesn't make it through the day before needs saba typically hfa, not neb Walking 6 laps at home s 02 but sob at end s desat so just using 02 at at hs  Still some leg swelling at end of day     No obvious day to day or daytime variabilty or assoc purulent sputum,  cp or chest tightness, subjective wheeze overt sinus or hb symptoms. No unusual exp hx or h/o childhood pna/ asthma or knowledge of premature birth.  Sleeping ok without nocturnal  or early am exacerbation  of respiratory  c/o's or need for noct saba. Also denies any obvious fluctuation of symptoms with weather or environmental changes or other aggravating or alleviating factors except as outlined above   Current Medications, Allergies, Complete Past Medical History, Past Surgical History, Family History, and Social History were [facility].  ROS  The following are not active complaints unless bolded sore throat, dysphagia, dental problems, itching, sneezing,  nasal congestion or excess/ purulent secretions, ear ache,   fever, chills, sweats, unintended wt loss, pleuritic or exertional cp, hemoptysis,  orthopnea pnd or leg swelling, presyncope, palpitations, heartburn, abdominal pain, anorexia, nausea, vomiting, diarrhea  or change in bowel or urinary habits, change in stools or urine, dysuria,hematuria,  rash, arthralgias, visual complaints, headache, numbness weakness or ataxia or problems with walking or coordination,  change in mood/affect or memory.          Objective:        Wt 188 01/09/12 >  07/27/2012  194 > 196 10/24/2012 > 01/23/2013  202> 200  04/25/2013 > 194 06/13/2013 >164 8/15 > 06/28/2013  191 >199 07/14/2013  >208 10/24/2013 > 11/07/2013  214 > 12/11/2013 208 > 01/19/2014 211 > 01/29/2014 209 > 02/16/2014  218  Amb, nad off 02    HEENT mild turbinate edema.  Oropharynx no thrush or excess pnd or cobblestoning.  No JVD or cervical adenopathy. Mild accessory muscle hypertrophy. Trachea midline, nl thryroid.  No Exp wheezing , no rhonchi, or crackles   Regular rate and rhythm without murmur gallop or rub or increase P2 - trace 1+ pitting bilateral LE pitting edema.  Abd: no hsm, nl excursion. Ext warm without cyanosis or clubbing.        CXR  02/16/2014 :  Partial interval clearing of right upper lobe opacity. No evidence of new airspace disease.      Assessment & Plan:

## 2014-02-16 NOTE — Patient Instructions (Signed)
Work on Musicianperfecting  inhaler technique:  relax and gently blow all the way out then take a nice smooth deep breath back in, triggering the inhaler at same time you start breathing in.  Hold for up to 5 seconds if you can.  Rinse and gargle with water when done  Add maxzide 25 one daily as needed for swelling      Please remember to go to the   x-ray department downstairs for your tests - we will call you with the results when they are available.  See calendar for specific medication instructions and bring it back for each and every office visit for every healthcare provider you see.  Without it,  you may not receive the best quality medical care that we feel you deserve.  You will note that the calendar groups together  your maintenance  medications that are timed at particular times of the day.  Think of this as your checklist for what your doctor has instructed you to do until your next evaluation to see what benefit  there is  to staying on a consistent group of medications intended to keep you well.  The other group at the bottom is entirely up to you to use as you see fit  for specific symptoms that may arise between visits that require you to treat them on an as needed basis.  Think of this as your action plan or "what if" list.   Separating the top medications from the bottom group is fundamental to providing you adequate care going forward.    Please schedule a follow up office visit in 6 weeks, call sooner if needed

## 2014-02-17 ENCOUNTER — Encounter: Payer: Self-pay | Admitting: Internal Medicine

## 2014-02-17 NOTE — Assessment & Plan Note (Signed)
Echo 01/11/2014 and 01/24/14 in setting of multiple PE> rx xarelto   He still has some tendency to peripheral edema > rx with prn maxzide 25 daily

## 2014-02-17 NOTE — Assessment & Plan Note (Signed)
-   See echo 01/11/14 c/w cor pulmonale - rx 2lpm since 01/10/14 > changed to hs  02/16/14  Plus prn daytime

## 2014-02-17 NOTE — Assessment & Plan Note (Addendum)
-   See CTa 01/23/2014 > rx xarelto - Venous dopplers 01/24/14 > neg  Likely explains some of his increase in PAH and infiltrates and hypoxemia (but probably not all)  On the other hand, improving clinically and cxr back near baseline on xarelto, albeit with very limited reserve> plan on 6 m minimum rx

## 2014-02-17 NOTE — Assessment & Plan Note (Addendum)
-   PFT's 12/02/2011  FEV1  0.94 (24%) ratio 39 -  18% response to B 2 and DLCO 60%   -Alpha 1 genotype sent 03/01/12 >  MM  -07/14/2013 Med calendar , 10/24/2013  - self titration of steroids with ceiling of 20/ floor of zero rec 11/07/13 > did not follow action plan 12/11/2013 > repeated same 01/19/14   The goal with a chronic steroid dependent illness is always arriving at the lowest effective dose that controls the disease/symptoms and not accepting a set "formula" which is based on statistics or guidelines that don't always take into account patient  variability or the natural hx of the dz in every individual patient, which may well vary over time.  For now therefore I recommend the patient maintain  A ceiling of 20 and a floor of 10 mg per day for now  The proper method of use, as well as anticipated side effects, of a metered-dose inhaler are discussed and demonstrated to the patient. Improved effectiveness after extensive coaching during this visit to a level of approximately  90% though at baseline < 50% due to too short a Ti    Each maintenance medication was reviewed in detail including most importantly the difference between maintenance and as needed and under what circumstances the prns are to be used. This was done in the context of a medication calendar review which provided the patient with a user-friendly unambiguous mechanism for medication administration and reconciliation and provides an action plan for all active problems. It is critical that this be shown to every doctor  for modification during the office visit if necessary so the patient can use it as a working document.

## 2014-02-26 ENCOUNTER — Telehealth: Payer: Self-pay | Admitting: Internal Medicine

## 2014-02-26 MED ORDER — PREDNISONE 10 MG PO TABS: ORAL_TABLET | ORAL | Status: DC

## 2014-02-26 MED ORDER — BISOPROLOL FUMARATE 5 MG PO TABS: ORAL_TABLET | ORAL | Status: DC

## 2014-02-26 NOTE — Telephone Encounter (Signed)
Spoke with pt. Aware RX's have been sent in. Nothing further needed

## 2014-03-14 ENCOUNTER — Telehealth: Payer: Self-pay | Admitting: Internal Medicine

## 2014-03-14 NOTE — Telephone Encounter (Signed)
Thanks for the FYI-will forward to MW to make him aware.

## 2014-03-30 ENCOUNTER — Ambulatory Visit (INDEPENDENT_AMBULATORY_CARE_PROVIDER_SITE_OTHER)
Admission: RE | Admit: 2014-03-30 | Discharge: 2014-03-30 | Disposition: A | Discharge status: Home / Self Care | Payer: BC Managed Care – PPO | Source: Ambulatory Visit | Attending: Internal Medicine | Admitting: Internal Medicine

## 2014-03-30 ENCOUNTER — Encounter: Payer: Self-pay | Admitting: Internal Medicine

## 2014-03-30 ENCOUNTER — Ambulatory Visit (INDEPENDENT_AMBULATORY_CARE_PROVIDER_SITE_OTHER): Payer: BC Managed Care – PPO | Admitting: Internal Medicine

## 2014-03-30 VITALS — BP 124/68 | HR 80 | Ht 76.0 in | Wt 216.0 lb

## 2014-03-30 DIAGNOSIS — J189 Pneumonia, unspecified organism: Secondary | ICD-10-CM

## 2014-03-30 DIAGNOSIS — J9610 Chronic respiratory failure, unspecified whether with hypoxia or hypercapnia: Secondary | ICD-10-CM

## 2014-03-30 DIAGNOSIS — J449 Chronic obstructive pulmonary disease, unspecified: Secondary | ICD-10-CM

## 2014-03-30 NOTE — Patient Instructions (Addendum)
Only use your 02 during the day if needed  Please remember to go to the  x-ray department downstairs for your tests - we will call you with the results when they are available.  See calendar for specific medication instructions and bring it back for each and every office visit for every healthcare provider you see.  Without it,  you may not receive the best quality medical care that we feel you deserve.  You will note that the calendar groups together  your maintenance  medications that are timed at particular times of the day.  Think of this as your checklist for what your doctor has instructed you to do until your next evaluation to see what benefit  there is  to staying on a consistent group of medications intended to keep you well.  The other group at the bottom is entirely up to you to use as you see fit  for specific symptoms that may arise between visits that require you to treat them on an as needed basis.  Think of this as your action plan or "what if" list.   Separating the top medications from the bottom group is fundamental to providing you adequate care going forward.    Please schedule a follow up visit in 3 months but call sooner if needed

## 2014-03-30 NOTE — Progress Notes (Signed)
Quick Note:  Spoke with pt and notified of results per Dr. Wert. Pt verbalized understanding and denied any questions.  ______ 

## 2014-03-30 NOTE — Progress Notes (Signed)
Subjective:    Patient ID: Theodore [name], male    DOB: 19-Jul-1956    MRN: [id number]   Brief patient profile:  [age] yowm longterm smoker quit 02/2012  since flu in 1994> slowly downhill since quit smoking in terms of best day function so referred to Pulmonary clinic 09/2011 by Dr Reggy EyeSteve Miller with GOLD IV copd confirmed 11/2011  HPI 09/28/2011 1st pulmonary eval cc persistent doe x 8 years slowly progressive with best days= moderate pace x mall ok and some days much worse despite spriva and foradil daily. Minimally better from albuterol.  Much better resp symptoms p prednisone for cluster ha, def improvement in head congestion, also less cough with prednsione.   cc having more cough at hs > white mucus, freq exac in am's, better p coffeee and gets around to taking am meds only an hour or two p rising. rec Stop foradil Start symbicort 160  Take 2 puffs first thing in am and then another 2 puffs about 12 hours later.  Work on inhaler technique:  .    Prednisone 10 mg take  4 each am x 2 days,   2 each am x 2 days,  1 each am x2days and stop Please schedule a follow up office visit in 4 weeks, sooner if needed with pft's   12/02/2011 f/u ov/Wert cc better on prednisone then some worse off it but overall  Better vs baseline doe. No cough rec Dulera 200 Take 2 puffs first thing in am and then another 2 puffs about 12 hours later and if you like it fill the prescription  Continue spiriva each am only - take this immediately after dulera  Work on [employer]maintainting perfect inhaler technique      02/19/2012  post hospital followup. Patient was admitted March 27 of 02/06/2012 for a COPD exacerbation. He was treated with IV antibiotics, nebulized bronchodilators, IV steroids. He was discharged on doxycycline and a steroid taper. Chest x-ray showed COPD changes without any acute process. He was changed off his Spiriva to Atrovent nebulizer. He was restarted on Dulera  2 puffs twice daily.  Since discharge.  Patient feels improved w/ decreased cough and congestion .  He has not smoked x 4 weeks. He has finished all his antibiotics and steroids. He will not be able to use atrovent neb due to work schedule.  rec Restart Spiriva  1 puff daily  Stop Atrovent NEB  For Rescue USE ONLY::Try to rest first then if not improved may go to inhaler or neb  May use Xopenex Inhaler 2 puffs every 4 hrs As needed  Wheezing-/trouble breathing - this is your rescue inhaler. -away from home  May use Albuterol NEB every 4 hr as needed for wheezing , trouble breathing - at home  Continue on Dulera 2 puffs Twice daily     03/01/2012 f/u ov/Wert cc breathing tends worse daily (vs on prednisone) toward end of shift (2pm to 830 pm).  Wakes up each am with rattling producing nothing @  Nl wake up time and not much albuterol use daytime in any form.  Prev aecopd ? Due to change to foradil instead of maintaining dulera and increased cig use which continues  "no more than 1-2 per week" rec Prednisone 10 mg take  4 each am x 2 days,   2 each am x 2 days,  1 each am x2days and stop  Stop smoking completely before smoking completely stops you! Only use your albuterol (plan B  xopenex, Plan C is nebulizer) as a rescue medication o be used up to every 4 hours if needed  Work on perfecting  inhaler technique   04/20/2012 f/u ov/Wert last cigarette 02/2012 overall no change doe and cough, no purulent sputum rec Congratulations on not smoking - it's the most important aspect of your care  For cough > mucinex 600 up to  2 every 12 hours For breathing xopenex hfa(your inhaler) up to 2 puffs every to 4 hours and only use the nebulizer if can't catch your breathing after the inhaler   04/25/2013 f/u ov/Wert re copd Chief Complaint  Patient presents with  . Follow-up    Breathing some worse with hot/humid weather. No other new co's today.    Mowed front yard self propelled without stopping and then had to stop every few minutes  on  back yard but still  rarely using saba  rec Dulera and Spiriva are Plan A = automatic =  take these "no matter what" Only use your albuterol (plan B Proaire/  Plan C is the nebulizer)  as   rescue medication to be used if you can't catch your breath by resting or doing a relaxed purse lip breathing pattern. The less you use it, the better it will work when you need it. Ok to use up to every 4 hours if needed  For cough and congestion > mucinex up to 1200 mg every 12 hours.  06/13/2013 acute  ov/Wert re ? [name] Chief Complaint  Patient presents with  . Acute Visit    increased SOB, rattling in chest, cough - prod in the early mornings.  Fever up to  100.8 in the evenings.    woke up 8/3  Feeling bad with purulent sputm then febrile pm 8/3 and 8/4 no rigors, min increase in saba use just as hfa, not neb. >zpack / pred   06/23/13 Acute OV  pt reports breathing had improved until 8/14  he woke up with SOB, prod cough.  still having some DOE, prod cough with white foamy mucus, chest tightness, wheezing.  CXR> bilateral LL opacities L>R  Levaquin 750mg  daily for 7 days Mucinex DM Twice daily  As needed  Cough/congestion  Prednisone taper over next week  06/28/2013 f/u ov/Wert f/u ? pna Chief Complaint  Patient presents with  . 1 Week ROV    Reports severe coughing, chest tightness, clear mucus production. Had CXR this morning.  fever gone, cough still severe > clear mucus better than it was Breathing better 8/18 then worse since with increases need for saba but no rsting sob  >>Pred taper   07/14/2013 Follow up and med review  Pt returns for follow up for PNA and med review  We reviewed all his medications and organized them into a med calendar  With pt education  Appears to be taking meds correctly.  Was treated for a LLL PNA , has finished 7 days of Levaquin 750mg . Feeling better with less cough and wheezing .  No fever, hemoptysis, chest pain or orthopnea.   CXR > COPD. Improving but  persistent density in the left lung base. Recommend continued followup  10/02/13 [name] aecopd x sev days  rec Prednisone 10 mg -Take 4 tabs  daily with food x 4 days, then 3 tabs daily x 4 days, then 2 tabs daily x 4 days, then 1 tab daily x4 days then stop. #40 Stay on dulera & spiriva Albuterol nebs thrice daily as needed  10/04/13  Augmentin 875 mg take one pill twice daily  X 10 days - take at breakfast and supper with large glass of water.   Albuterol neb up to every3 hours and if not comforable at rest p neb go to ER     10/09/2013 f/u ov/Wert re: aecopd/ no med calendar/ confused with action plan Chief Complaint  Patient presents with  . Acute Visit    Pt c/o increased SOB, chest tightness and chest congestion x 1 wk. He states gets out of breath just standing up or walking a few steps.   Ok at rest RA Last neb 3  h prior to OV . No purulent secretions on augmentin since 11/26 Mint def makes breathing worse >>pred taper   10/24/2013 Follow up and Med review  Patient returns for a two-week followup and medication review. We reviewed all his medications and organized them into a medication calendar with patient education. Patient appears to be taking his medications correctly. Patient has been having a slow to resolve COPD, exacerbation. He was treated with Augmentin  Steroid taper. 3 weeks ago. Patient reports symptoms only minimally improved. He is currently on prednisone 20 mg. Patient was also called in a Z-Pak 10 days ago. Patient reports he did not have any improvement with Z-Pak. He continues to have sinus congestion, drainage, productive cough with rattling mucus. Patient is using Mucinex DM and flutter valve without any help.  Patient reports that he takes his Dulera  and Spiriva on a consistent basis without any missed doses. Says his wheezing and cough worse in evening.  CT chest done last week showed scattered small subpleural nodules including some in  the right  upper lobe which have a somewhat tree-in-bud distribution, suggesting a postinflammatory process. No highly suspicious nodules rec Levaquin  daily for 7 days  Mucinex DM Twice daily  As needed  Cough/congestion  Continue on Prednisone  daily until seen back in office   11/07/2013 f/u ov/Wert re: aecopd still not back to baseline/ never went to ER Chief Complaint  Patient presents with  . Follow-up    Cough has improved some- still coughing up minimal white sputum.  SOB much better but not quite back at his normal baseline.   walk x sev hundred feet  Still some congested cough pred still at 20 mg per day  Much less saba dep and no longer needing neb form, just the hfa  rec As per med calendar, when need nebulizer as rescue, take prednisone 20 mg per day until no need, then 10 mg per day x 5 d and stop See calendar for specific medication   12/11/2013 f/u ov/Wert re: ran out of prednisone x sev weeks prior to OV and not compliant with gerd rx Chief Complaint  Patient presents with  . Acute Visit    Pt c/o increased SOB x 6 days- with nausea and vomiting at onset. He also has increased prod cough with white sputum. He has used x 2 in the past wk and is using rescue inhaler at least once per day.   used neb avg once daily since onset of worse breathing  But did not activate the prednisone action plan No discolored mucus  No med calendar  >>pred taper , CXR w/ no acute process.   01/05/2014 Acute OV  Complains of  increased SOB, wheezing, chest tightness, prod cough with white mucus x 3 days.  Denies hemoptysis, nausea, vomiting, edema, f/c/s. , body aches , or  orthopnea.  Started taking  prednisone few days ago, started steroids x 3 weeks ago ,tried to taper down but symptoms worsen on lower doses of prednisone.  Complains of increased anxiety and panic attacks. Has been using albuterol nebulizer with some relief. rec Doxycyline  Twice daily  For 7 days  Mucinex DM  Twice daily  As needed  Cough/congestion  Prednisone  daily for 4 days, then  daily for 4 days then back to  tapering dose according to med calendar.  Xanax 0.25mg  daily As needed  Anxiety . May make you sleepy.  Follow up in 2 -3 weeks as planned  and As needed   Fluids and rest  Tylenol As needed  Fever.  NO NSAIDS -advil, ibuprofen , aleve, etc.    Admit date: 01/10/2014  Discharge date: 01/13/2014  Recommendations for Outpatient Follow-up:  1. Please continue spiriva and symbicort as prior to this admission. May also use nebulizer treatments as prescribed .  2. Continue Levaquin for 4 more days on discharge.  3. Taper prednisone starting from 50 mg a day, taper down by 5 mg a day down to 0 mg and then stop.  4. Please make an appt with cardiology to have outpatient studies done; phone number provided in follow up section of AVS.  Discharge Diagnoses:  Active Problems:  COPD with acute exacerbation  COPD exacerbation  DM (diabetes mellitus)  NSTEMI (non-ST elevated myocardial infarction)  Acute cor pulmonale    01/19/2014 post hosp f/u ov/Wert re: re-establish / continuity of care/ no longer smoking Chief Complaint  Patient presents with  . Follow-up    Pt was hospitalized at [facility] for respiratory distress.  Pt c/o fever, SOB, chest tightness.  mucus thick and white No neb needed yet but still on 30 mg per day prednisone  Sob with more than sitting still. rec Prednisone ceiling is 20 mg per day and the floor is 10 mg daily for now See calendar  CTa 01/23/14 > multiple PE  01/29/2014 post hosp f/u ov/Wert re: copd/ pe  Chief Complaint  Patient presents with  . HFU    Pt c/o chest tightness and increased DOE since this am. Had to use neb this am. He has minimal cough with clear sputum.  He has been using neb 1 to 2 times per day.     Using 20 mg pred per day since needing neb, no purulent sputum or hemoptysis, off abx x 1 week rec Add 02 with any activity that you  know makes you short of breath but always at bedtime at a flow of 2lpm     . 02/16/2014 f/u ov/Wert re:  Copd/ 02 dep resp failure/ ? pulm infarct RUL Chief Complaint  Patient presents with  . Follow-up    Pt reports his breathinh has improved some. He has minimal cough- non prod and has "rattle in chest".  using pred 10 mg daily, dulera doesn't make it through the day before needs saba typically hfa, not neb Walking 6 laps at home s 02 but sob at end s desat so just using 02 at at hs  Still some leg swelling at end of day  rec Stop your powder inhalers Start symbicort 160 Take 2 puffs first thing in am and then another 2 puffs about 12 hours later.  Work on inhaler technique:  relax and gently blow all the way out then take a nice smooth deep breath back in, triggering the inhaler at same time you  start breathing in.  Hold for up to 5 seconds if you can.  Rinse and gargle with water when done If needed for breathing prefer duoneb every 4 hours if you can't get your breath Try prilosec(omeprazole) 40mg   Take 30-60 min before first meal of the day and Pepcid 20 mg (famotidine) one bedtime until cough is completely gone for at least a week without the need for cough suppression GERD diet    03/30/2014 f/u ov/Wert re: dulera 200 / spiriva 8 am   Then around 3 pm rescue saba hfa but not neb @  pred 10 mg daily  Chief Complaint  Patient presents with  . Follow-up    Pt states his breathing has improved a little since last visit.  C/o SOB with long exertion (more than 100 yards).   uses 02 at 2lpm  Long walks does 2lpm  Walked in room air at 97%  Min cough mucus is white No med calendar   No obvious day to day or daytime variabilty or assoc purulent sputum,  cp or chest tightness, subjective wheeze overt sinus or hb symptoms. No unusual exp hx or h/o childhood pna/ asthma or knowledge of premature birth.  Sleeping ok without nocturnal  or early am exacerbation  of respiratory  c/o's or  need for noct saba. Also denies any obvious fluctuation of symptoms with weather or environmental changes or other aggravating or alleviating factors except as outlined above   Current Medications, Allergies, Complete Past Medical History, Past Surgical History, Family History, and Social History were reviewed in [employer] record.  ROS  The following are not active complaints unless bolded sore throat, dysphagia, dental problems, itching, sneezing,  nasal congestion or excess/ purulent secretions, ear ache,   fever, chills, sweats, unintended wt loss, pleuritic or exertional cp, hemoptysis,  orthopnea pnd or leg swelling, presyncope, palpitations, heartburn, abdominal pain, anorexia, nausea, vomiting, diarrhea  or change in bowel or urinary habits, change in stools or urine, dysuria,hematuria,  rash, arthralgias, visual complaints, headache, numbness weakness or ataxia or problems with walking or coordination,  change in mood/affect or memory.          Objective:        Wt 188 01/09/12 >  07/27/2012  194 > 196 10/24/2012 > 01/23/2013  202> 200 04/25/2013 > 194 06/13/2013 >164 8/15 > 06/28/2013  191 >199 07/14/2013  >208 10/24/2013 > 11/07/2013  214 > 12/11/2013 208 > 01/19/2014 211 > 01/29/2014 209 > 02/16/2014  218 > 216 03/30/2014   Amb, nad off 02    HEENT mild turbinate edema.  Oropharynx no thrush or excess pnd or cobblestoning.  No JVD or cervical adenopathy. Mild accessory muscle hypertrophy. Trachea midline, nl thryroid.  No Exp wheezing , no rhonchi, or crackles   Regular rate and rhythm without murmur gallop or rub or increase P2 - trace 1+ pitting bilateral LE pitting edema.  Abd: no hsm, nl excursion. Ext warm without cyanosis or clubbing.         CXR  03/30/2014 : Further interval clearing of right upper lobe infiltrate. No evidence of new abnormality.       Assessment & Plan:

## 2014-03-31 NOTE — Assessment & Plan Note (Signed)
-   See echo 01/11/14 c/w cor pulmonale - rx 2lpm since 01/10/14 > changed to hs  02/16/14  Plus prn daytime - 03/30/2014  Walked RA x 3 laps @ 185 ft each stopped due to  End of study no desat  rec 2lpm hs only as of 03/30/14

## 2014-03-31 NOTE — Assessment & Plan Note (Signed)
-   PFT's 12/02/2011  FEV1  0.94 (24%) ratio 39 -  18% response to B 2 and DLCO 60%  -Alpha 1 genotype sent 03/01/12 >  MM  -07/14/2013 Med calendar , 10/24/2013  - self titration of steroids with ceiling of pred 20/ floor of zero rec 11/07/13 > did not follow action plan 12/11/2013 > repeated same 01/19/14  - 02/16/2014 p extensive coaching HFA effectiveness =    90% (ti too short at baseline)  The goal with a chronic steroid dependent illness is always arriving at the lowest effective dose that controls the disease/symptoms and not accepting a set "formula" which is based on statistics or guidelines that don't always take into account patient  variability or the natural hx of the dz in every individual patient, which may well vary over time.  For now therefore I recommend the patient maintain  Floor of 10 and ceiling of 20 mg daily as indicated by neb rx

## 2014-03-31 NOTE — Assessment & Plan Note (Signed)
Not clear this was infarct vs pna but clearing nicely

## 2014-04-16 ENCOUNTER — Telehealth: Payer: Self-pay | Admitting: Internal Medicine

## 2014-04-16 MED ORDER — RIVAROXABAN 20 MG PO TABS: 20.0000 mg | ORAL_TABLET | Freq: Every day | ORAL | Status: DC

## 2014-04-16 NOTE — Telephone Encounter (Signed)
RX has been sent in. Nothing further needed 

## 2014-04-16 NOTE — Telephone Encounter (Signed)
Ok to refill 

## 2014-04-16 NOTE — Telephone Encounter (Signed)
Pt is needing refill on xarelto 20 mg daily. He was giving this from the hospital. Please advise MW thanks

## 2014-05-01 ENCOUNTER — Telehealth: Payer: Self-pay | Admitting: Internal Medicine

## 2014-05-01 DIAGNOSIS — J9601 Acute respiratory failure with hypoxia: Secondary | ICD-10-CM

## 2014-05-01 NOTE — Telephone Encounter (Signed)
I called spoke with pt. He reports he sleeps with the O2 at bedtime w/ concentrator. He was told by MW not to use O2 during the day w/ exertion.  He wants his O2 tanks picked up and just keep the concentrator. Please advise MW thanks

## 2014-05-02 NOTE — Telephone Encounter (Signed)
Yes this was the plan as of 03/30/14

## 2014-05-02 NOTE — Telephone Encounter (Signed)
[facility]  I called and left detailed msg for the pt to be made aware

## 2014-05-08 ENCOUNTER — Other Ambulatory Visit: Payer: Self-pay | Admitting: Internal Medicine

## 2014-05-12 ENCOUNTER — Other Ambulatory Visit: Payer: Self-pay | Admitting: Internal Medicine

## 2014-05-24 NOTE — Telephone Encounter (Signed)
Encounter complete. 

## 2014-08-04 IMAGING — CR DG CHEST 2V
3 series · 3 of 3 positions shown · non-contrast
Comparison: 01/23/2014

CLINICAL DATA: Recent PE. Follow-up infarct. Cough, shortness of
breath, congestion, COPD.

EXAM:
CHEST  2 VIEW

[view not recorded (1 of 3)]
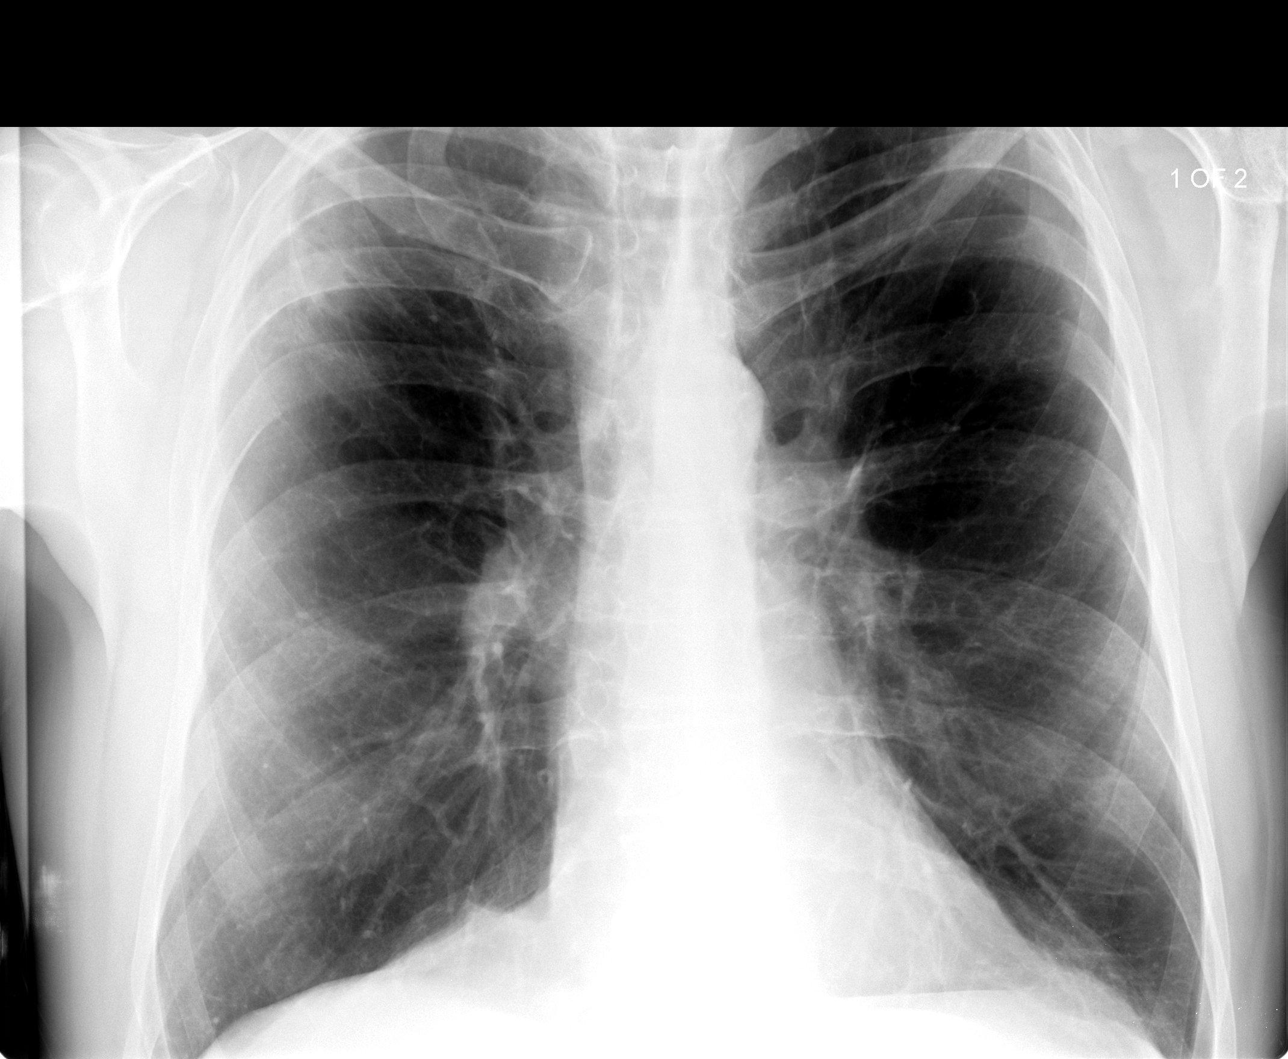

[view not recorded (2 of 3)]
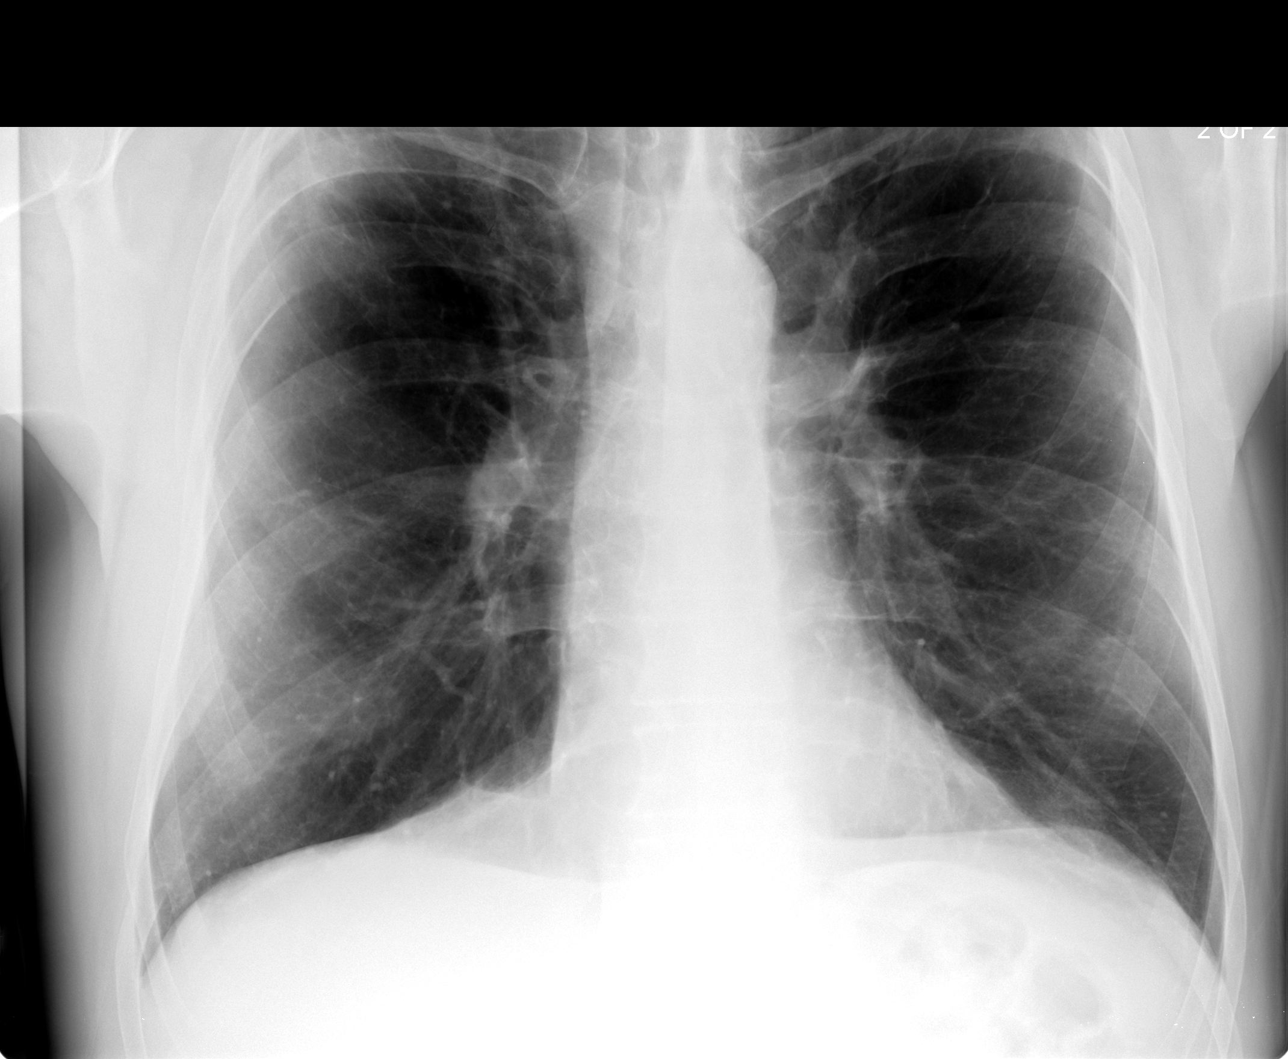

[view not recorded (3 of 3)]
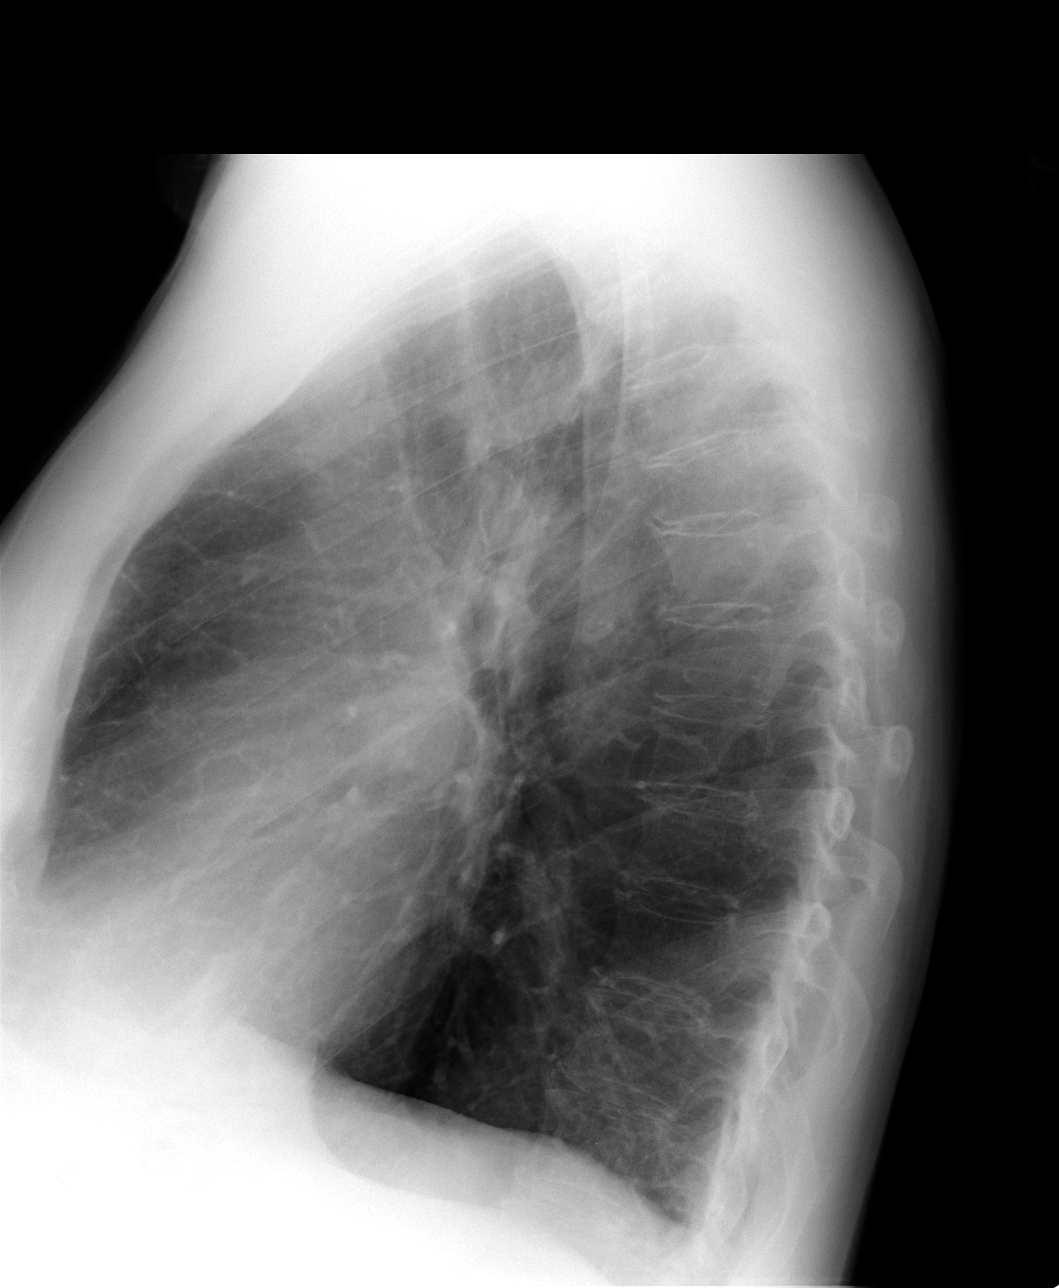

[3 of 3 positions shown; findings below may reference images not displayed]

FINDINGS: The cardiomediastinal silhouette is within normal limits. The lungs
remain hyperinflated. Peripheral right upper lobe opacity
demonstrates partial interval clearing. Linear opacities in the left
lung base are unchanged and may reflect scarring. There is no
evidence of new airspace consolidation. No pleural effusion or
pneumothorax is identified. Biapical scarring is present. The bones
are diffusely osteopenic.
IMPRESSION: Partial interval clearing of right upper lobe opacity. No evidence
of new airspace disease.

## 2014-08-06 ENCOUNTER — Ambulatory Visit (INDEPENDENT_AMBULATORY_CARE_PROVIDER_SITE_OTHER)
Admission: RE | Admit: 2014-08-06 | Discharge: 2014-08-06 | Disposition: A | Discharge status: Home / Self Care | Payer: BC Managed Care – PPO | Source: Ambulatory Visit | Attending: Internal Medicine | Admitting: Internal Medicine

## 2014-08-06 ENCOUNTER — Encounter: Payer: Self-pay | Admitting: Internal Medicine

## 2014-08-06 ENCOUNTER — Ambulatory Visit (INDEPENDENT_AMBULATORY_CARE_PROVIDER_SITE_OTHER): Payer: BC Managed Care – PPO | Admitting: Internal Medicine

## 2014-08-06 VITALS — BP 110/60 | HR 94 | Temp 98.8°F | Ht 76.0 in | Wt 208.2 lb

## 2014-08-06 DIAGNOSIS — J449 Chronic obstructive pulmonary disease, unspecified: Secondary | ICD-10-CM

## 2014-08-06 DIAGNOSIS — Z23 Encounter for immunization: Secondary | ICD-10-CM

## 2014-08-06 MED ORDER — BUDESONIDE-FORMOTEROL FUMARATE 160-4.5 MCG/ACT IN AERO: INHALATION_SPRAY | RESPIRATORY_TRACT | Status: DC

## 2014-08-06 NOTE — Progress Notes (Signed)
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 Minimally better from albuterol.  Much better resp symptoms p prednisone for cluster ha, def improvement in head congestion, also less cough with prednsione.   cc having more cough at hs > white mucus, freq exac in am's, better p coffeee and gets around to taking am meds only an hour or two p rising. rec Stop foradil Start symbicort 160  Take 2 puffs first thing in am and then another 2 puffs about 12 hours later.  Work on inhaler technique:  .    Prednisone 10 mg take  4 each am x 2 days,   2 each am x 2 days,  1 each am x2days and stop Please schedule a follow up office visit in 4 weeks, sooner if needed with pft's   12/02/2011 f/u ov/[name] cc better on prednisone then some worse off it but overall  Better vs baseline doe. No cough rec Dulera 200 Take 2 puffs first thing in am and then another 2 puffs about 12 hours later and if you like it fill the prescription  Continue spiriva each am only - take this immediately after dulera  Work on [employer] perfect inhaler technique      02/19/2012  post hospital followup. Patient was admitted March 27 of 02/06/2012 for a COPD exacerbation. He was treated with IV antibiotics, nebulized bronchodilators, IV steroids. He was discharged on doxycycline and a steroid taper. Chest x-ray showed COPD changes without any acute process. He was changed off his Spiriva to Atrovent nebulizer. He was restarted on Dulera  2 puffs twice daily.  Since discharge.  Patient feels improved w/ decreased cough and congestion .  He has not smoked x 4 weeks. He has finished all his antibiotics and steroids. He will not be able to use atrovent neb due to work schedule.  rec Restart Spiriva  1 puff daily  Stop Atrovent NEB  For Rescue USE ONLY::Try to rest first then if not improved may go to inhaler or neb  May use Xopenex Inhaler 2 puffs every 4 hrs As needed  Wheezing-/trouble breathing - this is your rescue inhaler. -away from home  May use Albuterol NEB every 4 hr as needed for wheezing , trouble breathing - at home  Continue on Dulera 2 puffs Twice daily     03/01/2012 f/u ov/Theodore [name] cc breathing tends worse daily (vs on prednisone) toward end of shift (2pm to 830 pm).  Wakes up each am with rattling producing nothing @  Nl wake up time and not much albuterol use daytime in any form.  Prev aecopd ? Due to change to foradil instead of maintaining dulera and increased cig use which continues  "no more than 1-2 per week" rec Prednisone 10 mg take  4 each am x 2 days,   2 each am x 2 days,  1 each am x2days and stop  Stop smoking completely before smoking completely stops you! Only use your albuterol (plan B  xopenex, Plan C is nebulizer) as a rescue medication o be used up to every 4 hours if needed  Work on perfecting  inhaler technique   04/20/2012 f/u ov/Theodore [name] last cigarette 02/2012 overall no change doe and cough, no purulent sputum rec Congratulations on not smoking - it's the most important aspect of your care  For cough > mucinex 600 up to  2 every 12 hours For breathing xopenex hfa(your inhaler) up to 2 puffs every to 4 hours and only use the nebulizer if can't catch your breathing after the inhaler   04/25/2013 f/u ov/Theodore [name] re copd Chief Complaint  Patient presents with  . Follow-up    Breathing some worse with hot/humid weather. No other new co's today.    Mowed front yard self propelled without stopping and then had to stop every few minutes  on  back yard but still  rarely using saba  rec Dulera and Spiriva are Plan A = automatic =  take these "no matter what" Only use your albuterol (plan B Proaire/  Plan C is the nebulizer)  as   rescue medication to be used if you can't catch your breath by resting or doing a relaxed purse lip breathing pattern. The less you use it, the better it will work when you need it. Ok to use up to every 4 hours if needed  For cough and congestion > mucinex up to 1200 mg every 12 hours.  06/13/2013 acute  ov/Theodore [name] re ? [name] Chief Complaint  Patient presents with  . Acute Visit    increased SOB, rattling in chest, cough - prod in the early mornings.  Fever up to  100.8 in the evenings.    woke up 8/3  Feeling bad with purulent sputm then febrile pm 8/3 and 8/4 no rigors, min increase in saba use just as hfa, not neb. >zpack / pred   06/23/13 Acute OV  pt reports breathing had improved until 8/14  he woke up with SOB, prod cough.  still having some DOE, prod cough with white foamy mucus, chest tightness, wheezing.  CXR> bilateral LL opacities L>R  Levaquin 750mg  daily for 7 days Mucinex DM Twice daily  As needed  Cough/congestion  Prednisone taper over next week  06/28/2013 f/u ov/Theodore [name] f/u ? pna Chief Complaint  Patient presents with  . 1 Week ROV    Reports severe coughing, chest tightness, clear mucus production. Had CXR this morning.  fever gone, cough still severe > clear mucus better than it was Breathing better 8/18 then worse since with increases need for saba but no rsting sob  >>Pred taper   07/14/2013 Follow up and med review  Pt returns for follow up for PNA and med review  We reviewed all his medications and organized them into a med calendar  With pt education  Appears to be taking meds correctly.  Was treated for a LLL PNA , has finished 7 days of Levaquin 750mg . Feeling better with less cough and wheezing .  No fever, hemoptysis, chest pain or orthopnea.   CXR > COPD. Improving but  persistent density in the left lung base. Recommend continued followup  10/02/13 [name] aecopd x sev days  rec Prednisone 10 mg -Take 4 tabs  daily with food x 4 days, then 3 tabs daily x 4 days, then 2 tabs daily x 4 days, then 1 tab daily x4 days then stop. #40 Stay on dulera & spiriva Albuterol nebs thrice daily as needed  10/04/13  Augmentin 875 mg take one pill twice daily  X 10 days - take at breakfast and supper with large glass of water.   Albuterol neb up to every3 hours and if not comforable at rest p neb go to ER     10/09/2013 f/u ov/[place] re: aecopd/ no med calendar/ confused with action plan Chief Complaint  Patient presents with  . Acute Visit    Pt c/o increased SOB, chest tightness and chest congestion x 1 wk. He states gets out of breath just standing up or walking a few steps.   Ok at rest RA Last neb 3  h prior to OV . No purulent secretions on augmentin since 11/26 Mint def makes breathing worse >>pred taper   10/24/2013 Follow up and Med review  Patient returns for a two-week followup and medication review. We reviewed all his medications and organized them into a medication calendar with patient education. Patient appears to be taking his medications correctly. Patient has been having a slow to resolve COPD, exacerbation. He was treated with Augmentin  Steroid taper. 3 weeks ago. Patient reports symptoms only minimally improved. He is currently on prednisone 20 mg. Patient was also called in a Z-Pak 10 days ago. Patient reports he did not have any improvement with Z-Pak. He continues to have sinus congestion, drainage, productive cough with rattling mucus. Patient is using Mucinex DM and flutter valve without any help.  Patient reports that he takes his Dulera  and Spiriva on a consistent basis without any missed doses. Says his wheezing and cough worse in evening.  CT chest done last week showed scattered small subpleural nodules including some in  the right  upper lobe which have a somewhat tree-in-bud distribution, suggesting a postinflammatory process. No highly suspicious nodules rec Levaquin  daily for 7 days  Mucinex DM Twice daily  As needed  Cough/congestion  Continue on Prednisone  daily until seen back in office   11/07/2013 f/u ov/Joao Mccurdy re: aecopd still not back to baseline/ never went to ER Chief Complaint  Patient presents with  . Follow-up    Cough has improved some- still coughing up minimal white sputum.  SOB much better but not quite back at his normal baseline.   walk x sev hundred feet  Still some congested cough pred still at 20 mg per day  Much less saba dep and no longer needing neb form, just the hfa  rec As per med calendar, when need nebulizer as rescue, take prednisone 20 mg per day until no need, then 10 mg per day x 5 d and stop See calendar for specific medication   12/11/2013 f/u ov/Torianne Laflam re: ran out of prednisone x sev weeks prior to OV and not compliant with gerd rx Chief Complaint  Patient presents with  . Acute Visit    Pt c/o increased SOB x 6 days- with nausea and vomiting at onset. He also has increased prod cough with white sputum. He has used x 2 in the past wk and is using rescue inhaler at least once per day.   used neb avg once daily since onset of worse breathing  But did not activate the prednisone action plan No discolored mucus  No med calendar  >>pred taper , CXR w/ no acute process.   01/05/2014 Acute OV  Complains of  increased SOB, wheezing, chest tightness, prod cough with white mucus x 3 days.  Denies hemoptysis, nausea, vomiting, edema, f/c/s. , body aches , or  orthopnea.  Started taking  prednisone few days ago, started steroids x 3 weeks ago ,tried to taper down but symptoms worsen on lower doses of prednisone.  Complains of increased anxiety and panic attacks. Has been using albuterol nebulizer with some relief. rec Doxycyline  Twice daily  For 7 days  Mucinex DM  Twice daily  As needed  Cough/congestion  Prednisone  daily for 4 days, then  daily for 4 days then back to  tapering dose according to med calendar.  Xanax 0.25mg  daily As needed  Anxiety . May make you sleepy.  Follow up in 2 -3 weeks as planned  and As needed   Fluids and rest  Tylenol As needed  Fever.  NO NSAIDS -advil, ibuprofen , aleve, etc.    Admit date: 01/10/2014  Discharge date: 01/13/2014  Recommendations for Outpatient Follow-up:  1. Please continue spiriva and symbicort as prior to this admission. May also use nebulizer treatments as prescribed .  2. Continue Levaquin for 4 more days on discharge.  3. Taper prednisone starting from 50 mg a day, taper down by 5 mg a day down to 0 mg and then stop.  4. Please make an appt with cardiology to have outpatient studies done; phone number provided in follow up section of AVS.  Discharge Diagnoses:  Active Problems:  COPD with acute exacerbation  COPD exacerbation  DM (diabetes mellitus)  NSTEMI (non-ST elevated myocardial infarction)  Acute cor pulmonale    01/19/2014 post hosp f/u ov/Ainslie Mazurek re: re-establish / continuity of care/ no longer smoking Chief Complaint  Patient presents with  . Follow-up    Pt was hospitalized at [facility] for respiratory distress.  Pt c/o fever, SOB, chest tightness.  mucus thick and white No neb needed yet but still on 30 mg per day prednisone  Sob with more than sitting still. rec Prednisone ceiling is 20 mg per day and the floor is 10 mg daily for now See calendar  CTa 01/23/14 > multiple PE  01/29/2014 post hosp f/u ov/Jarron Curley re: copd/ pe  Chief Complaint  Patient presents with  . HFU    Pt c/o chest tightness and increased DOE since this am. Had to use neb this am. He has minimal cough with clear sputum.  He has been using neb 1 to 2 times per day.     Using 20 mg pred per day since needing neb, no purulent sputum or hemoptysis, off abx x 1 week rec Add 02 with any activity that you  know makes you short of breath but always at bedtime at a flow of 2lpm     . 02/16/2014 f/u ov/Rayven Hendrickson re:  Copd/ 02 dep resp failure/ ? pulm infarct RUL Chief Complaint  Patient presents with  . Follow-up    Pt reports his breathinh has improved some. He has minimal cough- non prod and has "rattle in chest".  using pred 10 mg daily, dulera doesn't make it through the day before needs saba typically hfa, not neb Walking 6 laps at home s 02 but sob at end s desat so just using 02 at at hs  Still some leg swelling at end of day  rec Stop your powder inhalers Start symbicort 160 Take 2 puffs first thing in am and then another 2 puffs about 12 hours later.  Work on inhaler technique:  relax and gently blow all the way out then take a nice smooth deep breath back in, triggering the inhaler at same time you  start breathing in.  Hold for up to 5 seconds if you can.  Rinse and gargle with water when done If needed for breathing prefer duoneb every 4 hours if you can't get your breath Try prilosec(omeprazole)   Take 30-60 min before first meal of the day and Pepcid 20 mg (famotidine) one bedtime until cough is completely gone for at least a week without the need for cough suppression GERD diet    03/30/2014 f/u ov/Shirly Bartosiewicz re: dulera 200 / spiriva 8 am   Then around 3 pm rescue saba hfa but not neb @  pred 10 mg daily  Chief Complaint  Patient presents with  . Follow-up    Pt states his breathing has improved a little since last visit.  C/o SOB with long exertion (more than 100 yards).   uses 02 at 2lpm  Long walks does 2lpm  Walked in room air at 97%  Min cough mucus is white No med calendar rec Only use your 02 during the day if needed     08/06/2014 acute  ov/Berwyn Bigley re: acutely worse sob on pred 10 mg daily plus on dulera and spiriva  Chief Complaint  Patient presents with  . Follow-up    Pt c/o increased chest tightness for the past several days-relates to humidity.  His breathing is  unchanged. He is using proair once per day on average, but has not needed neb.    No obvious patterns in  day to day or daytime variabilty or assoc purulent sputum,  cp or chest tightness, subjective wheeze overt sinus or hb symptoms. No unusual exp hx or h/o childhood pna/ asthma or knowledge of premature birth.  Sleeping ok without nocturnal  or early am exacerbation  of respiratory  c/o's or need for noct saba. Also denies any obvious fluctuation of symptoms with weather or environmental changes or other aggravating or alleviating factors except as outlined above   Current Medications, Allergies, Complete Past Medical History, Past Surgical History, Family History, and Social History were reviewed in [employer] record.  ROS  The following are not active complaints unless bolded sore throat, dysphagia, dental problems, itching, sneezing,  nasal congestion or excess/ purulent secretions, ear ache,   fever, chills, sweats, unintended wt loss, pleuritic or exertional cp, hemoptysis,  orthopnea pnd or leg swelling, presyncope, palpitations, heartburn, abdominal pain, anorexia, nausea, vomiting, diarrhea  or change in bowel or urinary habits, change in stools or urine, dysuria,hematuria,  rash, arthralgias, visual complaints, headache, numbness weakness or ataxia or problems with walking or coordination,  change in mood/affect or memory.          Objective:        Wt 188 01/09/12 >  07/27/2012  194 > 196 10/24/2012 > 01/23/2013  202> 200 04/25/2013 > 194 06/13/2013 >164 8/15 > 06/28/2013  191 >199 07/14/2013  >208 10/24/2013 > 11/07/2013  214 > 12/11/2013 208 > 01/19/2014 211 > 01/29/2014 209 > 02/16/2014  218 > 216 03/30/2014 >  08/06/2014  208     Amb, nad off 02    HEENT mild turbinate edema.  Oropharynx no thrush or excess pnd or cobblestoning.  No JVD or cervical adenopathy. Mild accessory muscle hypertrophy. Trachea midline, nl thryroid.  No Exp wheezing , no rhonchi, or crackles    Regular rate and rhythm without murmur gallop or rub or increase P2 - trace to 1+ pitting bilateral LE pitting edema.  Abd: no hsm, nl excursion. Ext warm without cyanosis or  clubbing.         CXR  08/06/2014 : COPD. Biapical densities, right greater than left, apparently slightly  progressed in the right apex since prior study.  My review: very subtle thickening both apices ? Technique? No def mass/ infiltrate         Assessment & Plan:

## 2014-08-06 NOTE — Patient Instructions (Addendum)
Double prednisone to 20 mg per day with flare of need for your albuterol in any form   Please remember to go to the  x-ray department downstairs for your tests - we will call you with the results when they are available.  See [name] in 6  weeks with all your medications, even over the counter meds, separated in two separate bags, the ones you take no matter what vs the ones you stop once you feel better and take only as needed when you feel you need them.   Tammy  will generate for you a new user friendly medication calendar that will put [place] all on the same page re: your medication use.     Without this process, it simply isn't possible to assure that we are providing  your outpatient care  with  the attention to detail we feel you deserve.   If we cannot assure that you're getting that kind of care,  then we cannot manage your problem effectively from this clinic.  Once you have seen Tammy and we are sure that we're all on the same page with your medication use she will arrange follow up with me.

## 2014-08-06 NOTE — Assessment & Plan Note (Signed)
-   PFT's 12/02/2011  FEV1  0.94 (24%) ratio 39 -  18% response to B 2 and DLCO 60%  -Alpha 1 genotype sent 03/01/12 >  MM  -07/14/2013 Med calendar , 10/24/2013  - self titration of steroids with ceiling of pred 20/ floor of zero rec 11/07/13 > did not follow action plan 12/11/2013 > repeated same 01/19/14   His hfa is so good now that he rarely finds himself needing neb but the principle is the same:  Whenever has increased need for saba should double the dose of prednisone until better then resume the previous maint "floor of 10 mg per day using the principle that the lowest dose that works is the right dose.     Each maintenance medication was reviewed in detail including most importantly the difference between maintenance and as needed and under what circumstances the prns are to be used. This was done in the context of a medication calendar review which provided the patient with a user-friendly unambiguous mechanism for medication administration and reconciliation and provides an action plan for all active problems. It is critical that this be shown to every doctor  for modification during the office visit if necessary so the patient can use it as a working document.

## 2014-08-07 NOTE — Progress Notes (Signed)
Quick Note:  Spoke with pt and notified of results per Dr. Wert. Pt verbalized understanding and denied any questions.  ______ 

## 2014-08-09 ENCOUNTER — Other Ambulatory Visit: Payer: Self-pay | Admitting: Internal Medicine

## 2014-08-22 ENCOUNTER — Ambulatory Visit (INDEPENDENT_AMBULATORY_CARE_PROVIDER_SITE_OTHER): Payer: BC Managed Care – PPO | Admitting: Family

## 2014-08-22 ENCOUNTER — Encounter: Payer: Self-pay | Admitting: Family

## 2014-08-22 VITALS — BP 160/90 | HR 87 | Temp 98.5°F | Resp 16 | Ht 76.0 in | Wt 207.4 lb

## 2014-08-22 DIAGNOSIS — R110 Nausea: Secondary | ICD-10-CM | POA: Insufficient documentation

## 2014-08-22 DIAGNOSIS — I10 Essential (primary) hypertension: Secondary | ICD-10-CM

## 2014-08-22 NOTE — Patient Instructions (Signed)
Thank you for choosing [facility].  Summary/Instructions:   Please start taking Sennakot or Colace or a combination (Sennakot-S).  Please start a probiotic (Align is one choice).  Follow up for a physical.  Keep track of when symptoms occur.   Thank you for enrolling in MyChart. Please follow the instructions below to securely access your online medical record. MyChart allows you to send messages to your doctor, view your test results, renew your prescriptions, schedule appointments, and more.  How Do I Sign Up? 1. In your Internet browser, go to [email or web address]. 2. Click on the New  User? link in the Sign In box.  3. Enter your MyChart Access Code exactly as it appears below. You will not need to use this code after you have completed the sign-up process. If you do not sign up before the expiration date, you must request a new code. MyChart Access Code: [email or web address] Expires: 10/05/2014  1:51 PM  4. Enter the last four digits of your Social Security Number (xxxx) and Date of Birth (mm/dd/yyyy) as indicated and click Next. You will be taken to the next sign-up page. 5. Create a MyChart ID. This will be your MyChart login ID and cannot be changed, so think of one that is secure and easy to remember. 6. Create a MyChart password. You can change your password at any time. 7. Enter your Password Reset Question and Answer and click Next. This can be used at a later time if you forget your password.  8. Select your communication preference, and if applicable enter your e-mail address. You will receive e-mail notification when new information is available in MyChart by choosing to receive e-mail notifications and filling in your e-mail. 9. Click Sign In. You can now view your medical record.   Additional Information If you have questions, you can email [email or web address] or call [phone] to talk to our MyChart staff. Remember, MyChart is NOT  to be used for urgent needs. For medical emergencies, dial 911.

## 2014-08-22 NOTE — Progress Notes (Signed)
Subjective:    Patient ID: Theodore [name], male    DOB: 11-24-1955, 58 y.o.   MRN: [id number]  HPI:  Theodore [name] is a 58 y.o. male who presents today to establish care and for an offiice visit  1) Nausea - Started about 1 week ago after the usual family dinner. Started with RLQ pain with a wave of nausea and the slight pain moved into the back. Had 2-3 more waves of nausea and took a shower that helped decrease. Yesterday morning was sitting on the couch had a wave of nausea with vomiting this time. After about half-hour the back pain subsided. Describes the back pain in the right flank as  sharp. Denies fevers,chills, or urinary symptoms.  Not currently nauseous or denies back pain. Yesterday was only the second time this occurred.   2) Hypertension - currently maintained on bisoprolol, and maxzide. Denies any adverse side effects. Denies chest pain/discomfort, palpitations, or cardiac related shortness of breath.   BP Readings from Last 3 Encounters:  08/22/14 160/90  08/06/14 110/60  03/30/14 124/68   Allergies  Allergen Reactions  . Naproxen Sodium Itching   Current Outpatient Prescriptions on File Prior to Visit  Medication Sig Dispense Refill  . acetaminophen (TYLENOL) 500 MG tablet Take 1,000 mg by mouth every 8 (eight) hours as needed for mild pain or fever.      Marland Kitchen. albuterol (PROVENTIL) (2.5 MG/3ML) 0.083% nebulizer solution Take 2.5 mg by nebulization every 6 (six) hours as needed for wheezing or shortness of breath.      . bisoprolol (ZEBETA) 5 MG tablet Take 1/2 tablet daily  15 tablet  3  . budesonide-formoterol (SYMBICORT) 160-4.5 MCG/ACT inhaler Take 2 puffs first thing in am and then another 2 puffs about 12 hours later.  1 Inhaler  11  . famotidine (PEPCID) 20 MG tablet Take 20 mg by mouth at bedtime.      Marland Kitchen. guaiFENesin (MUCINEX) 600 MG 12 hr tablet Take 1,200 mg by mouth 2 (two) times daily.       . OXYGEN-HELIUM IN Inhale 2 L into the lungs. With exertion, qhs.       . pantoprazole (PROTONIX) 40 MG tablet Take 40 mg by mouth daily.      . predniSONE (DELTASONE) 10 MG tablet Takes 10 mg daily. May take up to 20 mg daily if needed  60 tablet  3  . PROAIR HFA 108 (90 BASE) MCG/ACT inhaler inhale 2 puffs by mouth every 6 hours if needed for wheezing OR shortness of breath  8.5 g  2  . sodium chloride (OCEAN) 0.65 % SOLN nasal spray Place 2 sprays into the nose as needed for congestion.      Marland Kitchen. tiotropium (SPIRIVA) 18 MCG inhalation capsule Place 18 mcg into inhaler and inhale daily.      Marland Kitchen. triamterene-hydrochlorothiazide (MAXZIDE-25) 37.5-25 MG per tablet TAKE 1 TABLET AS NEEDED FOR SWELLING  30 tablet  2  . XARELTO 20 MG TABS tablet TAKE 1 TABLET (20 MG TOTAL) BY MOUTH DAILY WITH SUPPER. (START 20 MG AFTER YOU FINISH WITH THE 15MG )  30 tablet  3   No current facility-administered medications on file prior to visit.   Past Medical History  Diagnosis Date  . COPD (chronic obstructive pulmonary disease)   . Seasonal allergies   . Asthma   . Pulmonary embolism 01/23/2014  . NSTEMI (non-ST elevated myocardial infarction) 01/2014  . Chronic bronchitis   . Pneumonia 06/2013; 01/2014  .  GERD (gastroesophageal reflux disease)   . Cluster headache     "last bout was summer 2014; had them q spring 1991-2001" (01/24/2014)  . Arthritis     "back" (01/24/2014)  . Anxiety   . On home oxygen therapy     "2L; 24/7" (01/24/2014)     Review of Systems  See HPI      Objective:     BP 160/90  Pulse 87  Temp(Src) 98.5 F (36.9 C) (Oral)  Resp 16  Ht 6\' 4"  (1.93 m)  Wt 207 lb 6.4 oz (94.076 kg)  BMI 25.26 kg/m2  SpO2 95% Nursing note and vital signs reviewed.  Physical Exam  Constitutional: He is oriented to person, place, and time. He appears well-developed and well-nourished. No distress.  Cardiovascular: Normal rate, regular rhythm and normal heart sounds.   Pulmonary/Chest: Effort normal and breath sounds normal.  Abdominal: Soft. Bowel sounds are  normal. He exhibits no distension and no mass. There is no tenderness. There is no rebound, no guarding, no CVA tenderness, no tenderness at McBurney's point and negative Murphy's sign.  Neurological: He is alert and oriented to person, place, and time.  Skin: Skin is warm and dry.  Psychiatric: He has a normal mood and affect. His behavior is normal. Judgment and thought content normal.      Assessment & Plan:

## 2014-08-22 NOTE — Assessment & Plan Note (Signed)
Symptoms consistent with possible constipation. Start daily Senna, Colace or Senna-S for bowel movements. Start probiotic to help with digestion.  Instructed to monitor if feeling returns and potential triggers. Will consider further imaging if symptoms return or worsen. Pt is in agreement with the treatment.

## 2014-08-22 NOTE — Assessment & Plan Note (Signed)
Today's reading was greater than goal, however patient indicated he did not take his medication yet today. Appears stable on current regimen. Continue current plan and recheck at next visit.

## 2014-08-30 ENCOUNTER — Other Ambulatory Visit: Payer: Self-pay | Admitting: Internal Medicine

## 2014-09-03 ENCOUNTER — Other Ambulatory Visit (INDEPENDENT_AMBULATORY_CARE_PROVIDER_SITE_OTHER): Payer: BC Managed Care – PPO

## 2014-09-03 ENCOUNTER — Encounter: Payer: Self-pay | Admitting: Family

## 2014-09-03 ENCOUNTER — Ambulatory Visit (INDEPENDENT_AMBULATORY_CARE_PROVIDER_SITE_OTHER): Payer: BC Managed Care – PPO | Admitting: Family

## 2014-09-03 VITALS — BP 170/100 | HR 108 | Temp 98.4°F | Resp 20 | Ht 76.0 in | Wt 208.8 lb

## 2014-09-03 DIAGNOSIS — M545 Low back pain, unspecified: Secondary | ICD-10-CM

## 2014-09-03 DIAGNOSIS — I10 Essential (primary) hypertension: Secondary | ICD-10-CM

## 2014-09-03 DIAGNOSIS — E119 Type 2 diabetes mellitus without complications: Secondary | ICD-10-CM

## 2014-09-03 DIAGNOSIS — Z23 Encounter for immunization: Secondary | ICD-10-CM

## 2014-09-03 DIAGNOSIS — Z0000 Encounter for general adult medical examination without abnormal findings: Secondary | ICD-10-CM

## 2014-09-03 DIAGNOSIS — M549 Dorsalgia, unspecified: Secondary | ICD-10-CM | POA: Insufficient documentation

## 2014-09-03 DIAGNOSIS — Z0001 Encounter for general adult medical examination with abnormal findings: Secondary | ICD-10-CM | POA: Insufficient documentation

## 2014-09-03 LAB — CBC
HEMATOCRIT: 44.5 % (ref 39.0–52.0)
Hemoglobin: 14.8 g/dL (ref 13.0–17.0)
MCHC: 33.2 g/dL (ref 30.0–36.0)
MCV: 92.4 fl (ref 78.0–100.0)
Platelets: 423 10*3/uL — ABNORMAL HIGH (ref 150.0–400.0)
RBC: 4.81 Mil/uL (ref 4.22–5.81)
RDW: 14.5 % (ref 11.5–15.5)
WBC: 15.8 10*3/uL — ABNORMAL HIGH (ref 4.0–10.5)

## 2014-09-03 LAB — TSH: TSH: 1.2 u[IU]/mL (ref 0.35–4.50)

## 2014-09-03 LAB — LIPID PANEL
CHOLESTEROL: 231 mg/dL — AB (ref 0–200)
HDL: 65.5 mg/dL (ref >39.00)
LDL Cholesterol: 145 mg/dL — ABNORMAL HIGH (ref 0–99)
NONHDL: 165.5
TRIGLYCERIDES: 101 mg/dL (ref 0.0–149.0)
Total CHOL/HDL Ratio: 4
VLDL: 20.2 mg/dL (ref 0.0–40.0)

## 2014-09-03 LAB — BASIC METABOLIC PANEL
BUN: 18 mg/dL (ref 6–23)
CO2: 24 mEq/L (ref 19–32)
Calcium: 9.3 mg/dL (ref 8.4–10.5)
Chloride: 100 mEq/L (ref 96–112)
Creatinine, Ser: 1.3 mg/dL (ref 0.4–1.5)
GFR: 61.97 mL/min (ref >60.00)
Glucose, Bld: 85 mg/dL (ref 70–99)
Potassium: 4.2 mEq/L (ref 3.5–5.1)
SODIUM: 137 meq/L (ref 135–145)

## 2014-09-03 LAB — MICROALBUMIN / CREATININE URINE RATIO
Creatinine,U: 209.2 mg/dL
MICROALB/CREAT RATIO: 0.7 mg/g (ref 0.0–30.0)
Microalb, Ur: 1.4 mg/dL (ref 0.0–1.9)

## 2014-09-03 NOTE — Progress Notes (Signed)
Subjective:    Patient ID: Theodore [name], male    DOB: 03-21-1956, 58 y.o.   MRN: [id number]  Chief Complaint  Patient presents with  . CPE    been having some low back pain x3 weeks    HPI:  Theodore [name] is a 58 y.o. male who presents today for an annual wellness visit.  1) Health Maintenance -   Diet - Doesn't eat well. States he eats some sorts of calories.  Exercise - Tries to walk, but it is dependent upon his breathing and how he feels.    Wt Readings from Last 3 Encounters:  09/03/14 208 lb 12.8 oz (94.711 kg)  08/22/14 207 lb 6.4 oz (94.076 kg)  08/06/14 208 lb 3.2 oz (94.439 kg)   2) Preventative Exams / Immunizations:  Dental -- Overdue  Vision -- About 2 years ago; requests referral to opthalmologist. Immunizations -- In need to TDap, had shingles and influenza  Colonoscopy -- Pt will schedule.   3) Back Pain - started about 3 weeks ago that came on with nausea and vomiting. Feels better with a bowel movement.   4) Hypertension - indicates his blood pressure has been up recently. Not currently taking the bisoprolol and taking the Maxzide as needed.  BP Readings from Last 3 Encounters:  09/03/14 170/100  08/22/14 160/90  08/06/14 110/60    Other medical physicians -  Dr. Sherene Sires - COPD  Allergies  Allergen Reactions  . Naproxen Sodium Itching   Current Outpatient Prescriptions on File Prior to Visit  Medication Sig Dispense Refill  . acetaminophen (TYLENOL) 500 MG tablet Take 1,000 mg by mouth every 8 (eight) hours as needed for mild pain or fever.      Marland Kitchen albuterol (PROVENTIL) (2.5 MG/3ML) 0.083% nebulizer solution Take 2.5 mg by nebulization every 6 (six) hours as needed for wheezing or shortness of breath.      . budesonide-formoterol (SYMBICORT) 160-4.5 MCG/ACT inhaler Take 2 puffs first thing in am and then another 2 puffs about 12 hours later.  1 Inhaler  11  . DULERA 200-5 MCG/ACT AERO INHALE 2 PUFFS BY MOUTH FIRST THING IN THE MORNING THEN  INHALE 2 PUFFS ABOUT 12 HOURS LATER  13 g  11  . famotidine (PEPCID) 20 MG tablet Take 20 mg by mouth at bedtime.      Marland Kitchen guaiFENesin (MUCINEX) 600 MG 12 hr tablet Take 1,200 mg by mouth 2 (two) times daily.       . OXYGEN-HELIUM IN Inhale 2 L into the lungs. With exertion, qhs.      . pantoprazole (PROTONIX) 40 MG tablet Take 40 mg by mouth daily.      . predniSONE (DELTASONE) 10 MG tablet Takes 10 mg daily. May take up to 20 mg daily if needed  60 tablet  3  . PROAIR HFA 108 (90 BASE) MCG/ACT inhaler inhale 2 puffs by mouth every 6 hours if needed for wheezing OR shortness of breath  8.5 g  2  . sodium chloride (OCEAN) 0.65 % SOLN nasal spray Place 2 sprays into the nose as needed for congestion.      Marland Kitchen tiotropium (SPIRIVA) 18 MCG inhalation capsule Place 18 mcg into inhaler and inhale daily.      Marland Kitchen triamterene-hydrochlorothiazide (MAXZIDE-25) 37.5-25 MG per tablet TAKE 1 TABLET AS NEEDED FOR SWELLING  30 tablet  2  . XARELTO 20 MG TABS tablet TAKE 1 TABLET (20 MG TOTAL) BY MOUTH DAILY WITH SUPPER. (START  20 MG AFTER YOU FINISH WITH THE 15MG )  30 tablet  3  . bisoprolol (ZEBETA) 5 MG tablet Take 1/2 tablet daily  15 tablet  3   No current facility-administered medications on file prior to visit.   Past Medical History  Diagnosis Date  . COPD (chronic obstructive pulmonary disease)   . Seasonal allergies   . Asthma   . Pulmonary embolism 01/23/2014  . NSTEMI (non-ST elevated myocardial infarction) 01/2014  . Chronic bronchitis   . Pneumonia 06/2013; 01/2014  . GERD (gastroesophageal reflux disease)   . Cluster headache     "last bout was summer 2014; had them q spring 1991-2001" (01/24/2014)  . Arthritis     "back" (01/24/2014)  . Anxiety   . On home oxygen therapy     "2L; 24/7" (01/24/2014)   Family History  Problem Relation Age of Onset  . Leukemia Father   . Heart disease Father   . Asthma Sister   . Asthma Father   . Emphysema Sister    History   Social History  . Marital  Status: Divorced    Spouse Name: N/A    Number of Children: 0  . Years of Education: 14   Occupational History  . Disability [employer]   Social History Main Topics  . Smoking status: Former Smoker -- 2.00 packs/day for 35 years    Types: Cigarettes    Quit date: 01/24/2012  . Smokeless tobacco: Never Used  . Alcohol Use: Yes     Comment: 01/24/2014 "holidays; family birthdays"  . Drug Use: No  . Sexual Activity: No   Other Topics Concern  . Not on file   Social History Narrative   [place] and moved around a lot. 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         Review of Systems  Constitutional: Denies fever, chills, fatigue, or significant weight gain/loss. HENT: Head: Denies headache or neck pain Ears: Denies changes in hearing, ringing in ears, earache, drainage Nose: Denies discharge, stuffiness, itching, nosebleed, sinus pain Throat: Denies sore throat, hoarseness, dry mouth, sores, thrush Eyes: Denies loss/changes in vision, pain, redness, blurry/double vision, flashing  Cardiovascular: Denies chest pain/discomfort, tightness, palpitations, shortness of breath with activity, difficulty lying down, swelling, sudden awakening with shortness of breath Respiratory: Denies shortness of breath, cough, sputum production. wheezing and some chest congestion.  Gastrointestinal: Denies dysphasia, heartburn, change in appetite, nausea, change in bowel habits, constipation, diarrhea, yellow skin or eyes Occasional constipation and hemorrhoids.  Genitourinary: Denies frequency, urgency, burning/pain, blood in urine, incontinence, change in urinary strength. Musculoskeletal: Denies other muscle/joint pain, stiffness, back pain, redness or swelling of joints, trauma than below Mild back discomfort which he attributes to occasional constipation.  Skin: Denies rashes, lumps, itching, dryness, color changes, or hair/nail changes Neurological: Denies  dizziness, fainting, seizures, weakness, numbness, tingling, tremor Psychiatric - Denies nervousness, stress, depression or memory loss Endocrine: Denies heat or cold intolerance, sweating, frequent urination, excessive thirst, changes in appetite Hematologic: Denies ease of bruising or bleeding    Objective:    BP 170/100  Pulse 108  Temp(Src) 98.4 F (36.9 C) (Oral)  Resp 20  Ht 6\' 4"  (1.93 m)  Wt 208 lb 12.8 oz (94.711 kg)  BMI 25.43 kg/m2  SpO2 94% Nursing note and vital signs reviewed.  Physical Exam  Constitutional: He is oriented to person, place, and time. He appears well-developed and well-nourished. No distress.  HENT:  Right Ear: Hearing, tympanic  membrane, external ear and ear canal normal.  Left Ear: Hearing, tympanic membrane, external ear and ear canal normal.  Nose: Nose normal.  Mouth/Throat: Oropharynx is clear and moist and mucous membranes are normal. Abnormal dentition.  Eyes:  Wears corrective lenses  Neck: Neck supple. No JVD present. No tracheal deviation present. No thyromegaly present.  Cardiovascular: Normal rate, regular rhythm and normal heart sounds.   Pulmonary/Chest: Effort normal. He has wheezes (occasional).  Abdominal: Soft. Bowel sounds are normal. He exhibits no distension and no mass. There is no tenderness. There is no rebound and no guarding.  Musculoskeletal: Normal range of motion.  No back pain able to be elicited. Full ROM noted in all lumbar motions.   Lymphadenopathy:    He has no cervical adenopathy.  Neurological: He is alert and oriented to person, place, and time. He has normal reflexes. No cranial nerve deficit. Coordination normal.  Skin: Skin is warm and dry.  Psychiatric: He has a normal mood and affect. His behavior is normal. Judgment and thought content normal.       Assessment & Plan:

## 2014-09-03 NOTE — Assessment & Plan Note (Signed)
1) Anticipatory Guidance: Discussed importance of biannual dental visits in health promotion and wellness; changing batteries in smoke detectors on a yearly basis; using sunscreen when outdoors; encouraging increased fruit and vegetable consumption; exercise most days of the week as tolerated by COPD.   2) Immunizations / Screenings / Labs:  Tetaunus shot completed today, other immunizations are up to date. Discussed Prevnar. / Referred for diabetic eye exam, patient will schedule colonoscopy with his GI specialist./ Obtain BMET, CBC, TSH and urine microalubumen

## 2014-09-03 NOTE — Patient Instructions (Signed)
Thank you for choosing [facility].  Summary/Instructions:   Please stop by the lab prior to leaving for your blood work - we will be in touch with the results  For your blood pressure - please take the maxide (triamterene-hctz) daily - we will follow in 3 months  Continue to use the Activa to help with constipation. May also use colace or senna  Please plan a follow up physical in a year.

## 2014-09-03 NOTE — Progress Notes (Signed)
Pre visit review using our clinic review tool, if applicable. No additional management support is needed unless otherwise documented below in the visit note. 

## 2014-09-03 NOTE — Assessment & Plan Note (Signed)
Improved with addition of Activia to diet and when he is not constipated. Continue dietary Activia and/or probiotic as needed. May also consider daily colace or senna.

## 2014-09-03 NOTE — Assessment & Plan Note (Addendum)
Will schedule diabetic eye exam. Discussed importance of annual eye and foot exams.

## 2014-09-03 NOTE — Assessment & Plan Note (Signed)
Blood pressure remains above goal despite taking medication. Change maxide from PRN to daily. Follow up in 2 weeks to determine blood pressure control and electrolyte balance.

## 2014-09-04 ENCOUNTER — Telehealth: Payer: Self-pay | Admitting: Family

## 2014-09-04 NOTE — Telephone Encounter (Signed)
Please call patient to inform him that his labs were overall within the expected ranges. His cholesterol levels were a little high, with a bad cholesterol of 143 and a goal of <100. The good news is that his good cholesterol is on the higher side so therefore no medication is needed right now. This can be controlled with diet and exercise. Increase fruits, vegetables and fiber and decrease saturated fats.

## 2014-09-04 NOTE — Telephone Encounter (Signed)
emmi mailed  °

## 2014-09-05 NOTE — Telephone Encounter (Signed)
Called pt to let him know results were normal other than his bad cholesterol being high. Let him know to try to increase fruit, vegetables, and fiber intake and decrease saturated fats.

## 2014-09-17 ENCOUNTER — Ambulatory Visit (INDEPENDENT_AMBULATORY_CARE_PROVIDER_SITE_OTHER): Payer: BC Managed Care – PPO | Admitting: Adult Health

## 2014-09-17 ENCOUNTER — Encounter: Payer: Self-pay | Admitting: Adult Health

## 2014-09-17 VITALS — BP 118/78 | HR 77 | Temp 97.1°F | Ht 76.0 in | Wt 209.0 lb

## 2014-09-17 DIAGNOSIS — J449 Chronic obstructive pulmonary disease, unspecified: Secondary | ICD-10-CM

## 2014-09-17 DIAGNOSIS — I2699 Other pulmonary embolism without acute cor pulmonale: Secondary | ICD-10-CM

## 2014-09-17 NOTE — Progress Notes (Signed)
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 Minimally better from albuterol.  Much better resp symptoms p prednisone for cluster ha, def improvement in head congestion, also less cough with prednsione.   cc having more cough at hs > white mucus, freq exac in am's, better p coffeee and gets around to taking am meds only an hour or two p rising. rec Stop foradil Start symbicort 160  Take 2 puffs first thing in am and then another 2 puffs about 12 hours later.  Work on inhaler technique:  .    Prednisone 10 mg take  4 each am x 2 days,   2 each am x 2 days,  1 each am x2days and stop Please schedule a follow up office visit in 4 weeks, sooner if needed with pft's   12/02/2011 f/u ov/Wert cc better on prednisone then some worse off it but overall  Better vs baseline doe. No cough rec Dulera 200 Take 2 puffs first thing in am and then another 2 puffs about 12 hours later and if you like it fill the prescription  Continue spiriva each am only - take this immediately after dulera  Work on [employer] perfect inhaler technique      02/19/2012  post hospital followup. Patient was admitted March 27 of 02/06/2012 for a COPD exacerbation. He was treated with IV antibiotics, nebulized bronchodilators, IV steroids. He was discharged on doxycycline and a steroid taper. Chest x-ray showed COPD changes without any acute process. He was changed off his Spiriva to Atrovent nebulizer. He was restarted on Dulera  2 puffs twice daily.  Since discharge.  Patient feels improved w/ decreased cough and congestion .  He has not smoked x 4 weeks. He has finished all his antibiotics and steroids. He will not be able to use atrovent neb due to work schedule.  rec Restart Spiriva  1 puff daily  Stop Atrovent NEB  For Rescue USE ONLY::Try to rest first then if not improved may go to inhaler or neb  May use Xopenex Inhaler 2 puffs every 4 hrs As needed  Wheezing-/trouble breathing - this is your rescue inhaler. -away from home  May use Albuterol NEB every 4 hr as needed for wheezing , trouble breathing - at home  Continue on Dulera 2 puffs Twice daily     03/01/2012 f/u ov/Wert cc breathing tends worse daily (vs on prednisone) toward end of shift (2pm to 830 pm).  Wakes up each am with rattling producing nothing @  Nl wake up time and not much albuterol use daytime in any form.  Prev aecopd ? Due to change to foradil instead of maintaining dulera and increased cig use which continues  "no more than 1-2 per week" rec Prednisone 10 mg take  4 each am x 2 days,   2 each am x 2 days,  1 each am x2days and stop  Stop smoking completely before smoking completely stops you! Only use your albuterol (plan B  xopenex, Plan C is nebulizer) as a rescue medication o be used up to every 4 hours if needed  Work on perfecting  inhaler technique   04/20/2012 f/u ov/Wert last cigarette 02/2012 overall no change doe and cough, no purulent sputum rec Congratulations on not smoking - it's the most important aspect of your care  For cough > mucinex 600 up to  2 every 12 hours For breathing xopenex hfa(your inhaler) up to 2 puffs every to 4 hours and only use the nebulizer if can't catch your breathing after the inhaler   04/25/2013 f/u ov/Wert re copd Chief Complaint  Patient presents with  . Follow-up    Breathing some worse with hot/humid weather. No other new co's today.    Mowed front yard self propelled without stopping and then had to stop every few minutes  on  back yard but still  rarely using saba  rec Dulera and Spiriva are Plan A = automatic =  take these "no matter what" Only use your albuterol (plan B Proaire/  Plan C is the nebulizer)  as   rescue medication to be used if you can't catch your breath by resting or doing a relaxed purse lip breathing pattern. The less you use it, the better it will work when you need it. Ok to use up to every 4 hours if needed  For cough and congestion > mucinex up to 1200 mg every 12 hours.  06/13/2013 acute  ov/Wert re ? [name] Chief Complaint  Patient presents with  . Acute Visit    increased SOB, rattling in chest, cough - prod in the early mornings.  Fever up to  100.8 in the evenings.    woke up 8/3  Feeling bad with purulent sputm then febrile pm 8/3 and 8/4 no rigors, min increase in saba use just as hfa, not neb. >zpack / pred   06/23/13 Acute OV  pt reports breathing had improved until 8/14  he woke up with SOB, prod cough.  still having some DOE, prod cough with white foamy mucus, chest tightness, wheezing.  CXR> bilateral LL opacities L>R  Levaquin 750mg  daily for 7 days Mucinex DM Twice daily  As needed  Cough/congestion  Prednisone taper over next week  06/28/2013 f/u ov/Wert f/u ? pna Chief Complaint  Patient presents with  . 1 Week ROV    Reports severe coughing, chest tightness, clear mucus production. Had CXR this morning.  fever gone, cough still severe > clear mucus better than it was Breathing better 8/18 then worse since with increases need for saba but no rsting sob  >>Pred taper   07/14/2013 Follow up and med review  Pt returns for follow up for PNA and med review  We reviewed all his medications and organized them into a med calendar  With pt education  Appears to be taking meds correctly.  Was treated for a LLL PNA , has finished 7 days of Levaquin 750mg . Feeling better with less cough and wheezing .  No fever, hemoptysis, chest pain or orthopnea.   CXR > COPD. Improving but  persistent density in the left lung base. Recommend continued followup  10/02/13 [name] aecopd x sev days  rec Prednisone 10 mg -Take 4 tabs  daily with food x 4 days, then 3 tabs daily x 4 days, then 2 tabs daily x 4 days, then 1 tab daily x4 days then stop. #40 Stay on dulera & spiriva Albuterol nebs thrice daily as needed  10/04/13  Augmentin 875 mg take one pill twice daily  X 10 days - take at breakfast and supper with large glass of water.   Albuterol neb up to every3 hours and if not comforable at rest p neb go to ER     10/09/2013 f/u ov/Wert re: aecopd/ no med calendar/ confused with action plan Chief Complaint  Patient presents with  . Acute Visit    Pt c/o increased SOB, chest tightness and chest congestion x 1 wk. He states gets out of breath just standing up or walking a few steps.   Ok at rest RA Last neb 3  h prior to OV . No purulent secretions on augmentin since 11/26 Mint def makes breathing worse >>pred taper   10/24/2013 Follow up and Med review  Patient returns for a two-week followup and medication review. We reviewed all his medications and organized them into a medication calendar with patient education. Patient appears to be taking his medications correctly. Patient has been having a slow to resolve COPD, exacerbation. He was treated with Augmentin  Steroid taper. 3 weeks ago. Patient reports symptoms only minimally improved. He is currently on prednisone 20 mg. Patient was also called in a Z-Pak 10 days ago. Patient reports he did not have any improvement with Z-Pak. He continues to have sinus congestion, drainage, productive cough with rattling mucus. Patient is using Mucinex DM and flutter valve without any help.  Patient reports that he takes his Dulera  and Spiriva on a consistent basis without any missed doses. Says his wheezing and cough worse in evening.  CT chest done last week showed scattered small subpleural nodules including some in  the right  upper lobe which have a somewhat tree-in-bud distribution, suggesting a postinflammatory process. No highly suspicious nodules rec Levaquin  daily for 7 days  Mucinex DM Twice daily  As needed  Cough/congestion  Continue on Prednisone  daily until seen back in office   11/07/2013 f/u ov/Wert re: aecopd still not back to baseline/ never went to ER Chief Complaint  Patient presents with  . Follow-up    Cough has improved some- still coughing up minimal white sputum.  SOB much better but not quite back at his normal baseline.   walk x sev hundred feet  Still some congested cough pred still at 20 mg per day  Much less saba dep and no longer needing neb form, just the hfa  rec As per med calendar, when need nebulizer as rescue, take prednisone 20 mg per day until no need, then 10 mg per day x 5 d and stop See calendar for specific medication   12/11/2013 f/u ov/Wert re: ran out of prednisone x sev weeks prior to OV and not compliant with gerd rx Chief Complaint  Patient presents with  . Acute Visit    Pt c/o increased SOB x 6 days- with nausea and vomiting at onset. He also has increased prod cough with white sputum. He has used x 2 in the past wk and is using rescue inhaler at least once per day.   used neb avg once daily since onset of worse breathing  But did not activate the prednisone action plan No discolored mucus  No med calendar  >>pred taper , CXR w/ no acute process.   01/05/2014 Acute OV  Complains of  increased SOB, wheezing, chest tightness, prod cough with white mucus x 3 days.  Denies hemoptysis, nausea, vomiting, edema, f/c/s. , body aches , or  orthopnea.  Started taking  prednisone few days ago, started steroids x 3 weeks ago ,tried to taper down but symptoms worsen on lower doses of prednisone.  Complains of increased anxiety and panic attacks. Has been using albuterol nebulizer with some relief. rec Doxycyline  Twice daily  For 7 days  Mucinex DM  Twice daily  As needed  Cough/congestion  Prednisone  daily for 4 days, then  daily for 4 days then back to  tapering dose according to med calendar.  Xanax 0.25mg  daily As needed  Anxiety . May make you sleepy.  Follow up in 2 -3 weeks as planned  and As needed   Fluids and rest  Tylenol As needed  Fever.  NO NSAIDS -advil, ibuprofen , aleve, etc.    Admit date: 01/10/2014  Discharge date: 01/13/2014  Recommendations for Outpatient Follow-up:  1. Please continue spiriva and symbicort as prior to this admission. May also use nebulizer treatments as prescribed .  2. Continue Levaquin for 4 more days on discharge.  3. Taper prednisone starting from 50 mg a day, taper down by 5 mg a day down to 0 mg and then stop.  4. Please make an appt with cardiology to have outpatient studies done; phone number provided in follow up section of AVS.  Discharge Diagnoses:  Active Problems:  COPD with acute exacerbation  COPD exacerbation  DM (diabetes mellitus)  NSTEMI (non-ST elevated myocardial infarction)  Acute cor pulmonale    01/19/2014 post hosp f/u ov/Wert re: re-establish / continuity of care/ no longer smoking Chief Complaint  Patient presents with  . Follow-up    Pt was hospitalized at [facility] for respiratory distress.  Pt c/o fever, SOB, chest tightness.  mucus thick and white No neb needed yet but still on 30 mg per day prednisone  Sob with more than sitting still. rec Prednisone ceiling is 20 mg per day and the floor is 10 mg daily for now See calendar  CTa 01/23/14 > multiple PE  01/29/2014 post hosp f/u ov/Wert re: copd/ pe  Chief Complaint  Patient presents with  . HFU    Pt c/o chest tightness and increased DOE since this am. Had to use neb this am. He has minimal cough with clear sputum.  He has been using neb 1 to 2 times per day.     Using 20 mg pred per day since needing neb, no purulent sputum or hemoptysis, off abx x 1 week rec Add 02 with any activity that you  know makes you short of breath but always at bedtime at a flow of 2lpm     . 02/16/2014 f/u ov/Wert re:  Copd/ 02 dep resp failure/ ? pulm infarct RUL Chief Complaint  Patient presents with  . Follow-up    Pt reports his breathinh has improved some. He has minimal cough- non prod and has "rattle in chest".  using pred 10 mg daily, dulera doesn't make it through the day before needs saba typically hfa, not neb Walking 6 laps at home s 02 but sob at end s desat so just using 02 at at hs  Still some leg swelling at end of day  rec Stop your powder inhalers Start symbicort 160 Take 2 puffs first thing in am and then another 2 puffs about 12 hours later.  Work on inhaler technique:  relax and gently blow all the way out then take a nice smooth deep breath back in, triggering the inhaler at same time you  start breathing in.  Hold for up to 5 seconds if you can.  Rinse and gargle with water when done If needed for breathing prefer duoneb every 4 hours if you can't get your breath Try prilosec(omeprazole) 40mg   Take 30-60 min before first meal of the day and Pepcid 20 mg (famotidine) one bedtime until cough is completely gone for at least a week without the need for cough suppression GERD diet    03/30/2014 f/u ov/Wert re: dulera 200 / spiriva 8 am   Then around 3 pm rescue saba hfa but not neb @  pred 10 mg daily  Chief Complaint  Patient presents with  . Follow-up    Pt states his breathing has improved a little since last visit.  C/o SOB with long exertion (more than 100 yards).   uses 02 at 2lpm  Long walks does 2lpm  Walked in room air at 97%  Min cough mucus is white No med calendar rec Only use your 02 during the day if needed     08/06/2014 acute  ov/Wert re: acutely worse sob on pred 10 mg daily plus on dulera and spiriva  Chief Complaint  Patient presents with  . Follow-up    Pt c/o increased chest tightness for the past several days-relates to humidity.  His breathing is  unchanged. He is using proair once per day on average, but has not needed neb.   >>increase pred 20mg  with flare    09/17/2014 Follow up and Med Review (Hx of severe COPD- on nocturnal O2,/PE 01/2014/ ? pulm infarct RUL-Xarelto)  Returns for follow up and medication review We reviewed all his meds and organized them into a medication calendar with  Pt education.  It appears that he is taking his medications correctly Wants to try a sample of symbicort to see if any better then [facility] .  He remains on prednisone 10 mg daily.  Reports having some good days and bad, more bad days with the weather. Patient denies any chest pain, orthopnea, PND, hemoptysis or leg swelling   Current Medications, Allergies, Complete Past Medical History, Past Surgical History, Family History, and Social History were reviewed in [facility] electronic medical record.  ROS  The following are not active complaints unless bolded sore throat, dysphagia, dental problems, itching, sneezing,  nasal congestion or excess/ purulent secretions, ear ache,   fever, chills, sweats, unintended wt loss, pleuritic or exertional cp, hemoptysis,  orthopnea pnd or leg swelling, presyncope, palpitations, heartburn, abdominal pain, anorexia, nausea, vomiting, diarrhea  or change in bowel or urinary habits, change in stools or urine, dysuria,hematuria,  rash, arthralgias, visual complaints, headache, numbness weakness or ataxia or problems with walking or coordination,  change in mood/affect or memory.          Objective:        Wt 188 01/09/12 >  07/27/2012  194 > 196 10/24/2012 > 01/23/2013  202> 200 04/25/2013 > 194 06/13/2013 >164 8/15 > 06/28/2013  191 >199 07/14/2013  >208 10/24/2013 > 11/07/2013  214 > 12/11/2013 208 > 01/19/2014 211 > 01/29/2014 209 > 02/16/2014  218 > 216 03/30/2014 >  08/06/2014  208     Amb, nad    HEENT mild turbinate edema.  Oropharynx no thrush or excess pnd or cobblestoning.  No JVD or cervical adenopathy. Mild  accessory muscle hypertrophy. Trachea midline, nl thryroid.  No Exp wheezing , no rhonchi, or crackles   Regular rate and rhythm without murmur gallop or rub or  increase P2 - trace  edema.  Abd: no hsm, nl excursion. Ext warm without cyanosis or clubbing.         CXR  08/06/2014 : COPD. Biapical densities, right greater than left, apparently slightly  progressed in the right apex since prior study.  Wert review: very subtle thickening both apices ? Technique? No def mass/ infiltrate         Assessment & Plan:

## 2014-09-17 NOTE — Patient Instructions (Signed)
Continue on current regimen.  May try Symbicort in place of Dulera to see if you like it better, call our office if you want to change.  Follow med calendar closely and bring to each visit.   Follow up in 6 weeks with Dr. Sherene SiresWert and As needed   Please contact office for sooner follow up if symptoms do not improve or worsen or seek emergency care

## 2014-09-17 NOTE — Assessment & Plan Note (Signed)
Severe COPD, compensated on present regimen. Patient's medications were reviewed today and patient education was given. Computerized medication calendar was adjusted/completed   Plan  Continue on current regimen.  May try Symbicort in place of Dulera to see if you like it better, call our office if you want to change.  Follow med calendar closely and bring to each visit.   Follow up in 6 weeks with Dr. Sherene SiresWert and As needed   Please contact office for sooner follow up if symptoms do not improve or worsen or seek emergency care

## 2014-09-17 NOTE — Assessment & Plan Note (Signed)
Continue on Xarelto  follow up in office in 3 months and As needed

## 2014-09-21 ENCOUNTER — Other Ambulatory Visit: Payer: Self-pay | Admitting: Internal Medicine

## 2014-09-21 MED ORDER — BUDESONIDE-FORMOTEROL FUMARATE 160-4.5 MCG/ACT IN AERO: 2.0000 | INHALATION_SPRAY | Freq: Two times a day (BID) | RESPIRATORY_TRACT | Status: DC

## 2014-09-21 NOTE — Addendum Note (Signed)
Addended by: Boone MasterJONES, Shonita Rinck E on: 09/21/2014 03:01 PM   Modules accepted: Orders

## 2014-09-27 ENCOUNTER — Other Ambulatory Visit: Payer: Self-pay | Admitting: Internal Medicine

## 2014-09-28 NOTE — Addendum Note (Signed)
Addended by: Boone MasterJONES, JESSICA E on: 09/28/2014 01:28 PM   Modules accepted: Orders, Medications

## 2014-10-29 ENCOUNTER — Ambulatory Visit (INDEPENDENT_AMBULATORY_CARE_PROVIDER_SITE_OTHER): Payer: BC Managed Care – PPO | Admitting: Internal Medicine

## 2014-10-29 DIAGNOSIS — J449 Chronic obstructive pulmonary disease, unspecified: Secondary | ICD-10-CM

## 2014-10-29 NOTE — Assessment & Plan Note (Signed)
No show

## 2014-10-29 NOTE — Progress Notes (Signed)
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 Minimally better from albuterol.  Much better resp symptoms p prednisone for cluster ha, def improvement in head congestion, also less cough with prednsione.   cc having more cough at hs > white mucus, freq exac in am's, better p coffeee and gets around to taking am meds only an hour or two p rising. rec Stop foradil Start symbicort 160  Take 2 puffs first thing in am and then another 2 puffs about 12 hours later.  Work on inhaler technique:  .    Prednisone 10 mg take  4 each am x 2 days,   2 each am x 2 days,  1 each am x2days and stop Please schedule a follow up office visit in 4 weeks, sooner if needed with pft's        10/24/2013 Follow up and Med review  Patient returns for a two-week followup and medication review. We reviewed all his medications and organized them into a medication calendar with patient education. Patient appears to be taking his medications correctly. Patient has been having a slow to resolve COPD, exacerbation. He was treated with Augmentin  Steroid taper. 3 weeks ago. Patient reports symptoms only minimally improved. He is currently on prednisone 20 mg. Patient was also called in a Z-Pak 10 days ago. Patient reports he did not have any improvement with Z-Pak. He continues to have sinus congestion, drainage, productive cough with rattling mucus. Patient is using Mucinex DM and flutter valve without any help.  Patient reports that he takes his Dulera  and Spiriva on  a consistent basis without any missed doses. Says his wheezing and cough worse in evening.  CT chest done last week showed scattered small subpleural nodules including some in  the right upper lobe which have a somewhat tree-in-bud distribution, suggesting a postinflammatory process. No highly suspicious nodules rec Levaquin 500mg  daily for 7 days  Mucinex DM Twice daily  As needed  Cough/congestion  Continue on Prednisone 20mg  daily until seen back in office   11/07/2013 f/u ov/[name] re: aecopd still not back to baseline/ never went to ER Chief Complaint  Patient presents with  . Follow-up    Cough has improved some- still coughing up minimal white sputum.  SOB much better but not quite back at his normal baseline.   walk x sev hundred feet  Still some congested cough pred still at 20 mg per day  Much less saba dep and no longer needing neb form, just the hfa  rec As per med calendar, when need nebulizer as rescue, take prednisone 20 mg per day until no need, then 10 mg per day x 5 d and stop See calendar for specific medication     Admit date: 01/10/2014  Discharge date: 01/13/2014  Recommendations for Outpatient Follow-up:  1. Please continue spiriva and symbicort as prior to this admission. May also use nebulizer treatments as prescribed .  2. Continue Levaquin for 4 more days  on discharge.  3. Taper prednisone starting from 50 mg a day, taper down by 5 mg a day down to 0 mg and then stop.  4. Please make an appt with cardiology to have outpatient studies done; phone number provided in follow up section of AVS.  Discharge Diagnoses:  Active Problems:  COPD with acute exacerbation  COPD exacerbation  DM (diabetes mellitus)  NSTEMI (non-ST elevated myocardial infarction)  Acute cor pulmonale    01/19/2014 post hosp f/u ov/[name] re: re-establish / continuity of care/ no longer smoking Chief Complaint  Patient presents with  . Follow-up    [facility].  Pt c/o fever, SOB, chest tightness.  mucus thick and white No neb needed yet but still on 30 mg per day prednisone  Sob with more than sitting still. rec Prednisone ceiling is 20 mg per day and the floor is 10 mg daily for now See calendar  CTa 01/23/14 > multiple PE  03/30/2014 f/u ov/[name] re: dulera 200 / spiriva 8 am   Then around 3 pm rescue saba hfa but not neb @  pred 10 mg daily  Chief Complaint  Patient presents with  . Follow-up    Pt states his breathing has improved a little since last visit.  C/o SOB with long exertion (more than 100 yards).   uses 02 at 2lpm  Long walks does 2lpm  Walked in room air at 97%  Min cough mucus is white No med calendar rec Only use your 02 during the day if needed     08/06/2014 acute  ov/[name] re: acutely worse sob on pred 10 mg daily plus on dulera and spiriva  Chief Complaint  Patient presents with  . Follow-up    Pt c/o increased chest tightness for the past several days-relates to humidity.  His breathing is unchanged. He is using proair once per day on average, but has not needed neb.   >>increase pred 20mg  with flare    09/17/2014 Follow up and Med Review (Hx of severe COPD- on nocturnal O2,/PE 01/2014/ ? pulm infarct RUL-Xarelto)  Returns for follow up and medication review We reviewed all his meds and organized them into a medication calendar with  Pt education.  It appears that he is taking his medications correctly Wants to try a sample of symbicort to see if any better then [facility] .  He remains on prednisone 10 mg daily.  Reports having some good days and bad, more bad days with the weather. rec Continue on current regimen.  May try Symbicort in place of Dulera to see if you like it better, call our office if you want to change.  Follow med calendar closely and bring to each visit.     10/29/2014  NO SHOW             Objective:        Wt 188 01/09/12 >  07/27/2012  194 > 196 10/24/2012 >  01/23/2013  202> 200 04/25/2013 > 194 06/13/2013 >164 8/15 > 06/28/2013  191 >199 07/14/2013  >208 10/24/2013 > 11/07/2013  214 > 12/11/2013 208 > 01/19/2014 211 > 01/29/2014 209 > 02/16/2014  218 > 216 03/30/2014 >  08/06/2014  208   >                 Assessment & Plan:

## 2014-10-30 ENCOUNTER — Ambulatory Visit (INDEPENDENT_AMBULATORY_CARE_PROVIDER_SITE_OTHER): Payer: BC Managed Care – PPO | Admitting: Internal Medicine

## 2014-10-30 ENCOUNTER — Encounter: Payer: Self-pay | Admitting: Internal Medicine

## 2014-10-30 VITALS — BP 130/72 | HR 107 | Temp 98.1°F | Ht 76.0 in | Wt 211.0 lb

## 2014-10-30 DIAGNOSIS — J449 Chronic obstructive pulmonary disease, unspecified: Secondary | ICD-10-CM

## 2014-10-30 MED ORDER — BUDESONIDE-FORMOTEROL FUMARATE 160-4.5 MCG/ACT IN AERO: INHALATION_SPRAY | RESPIRATORY_TRACT | Status: DC

## 2014-10-30 NOTE — Patient Instructions (Signed)
If you change your mind about being referred to rehab then let us know  Use the flutter valve as much as possible whenever have the urge to cough or sense of retained mucus  See calendar for specific medication instructions and bring it back for each and every office visit for every healthcare provider you see.  Without it,  you may not receive the best quality medical care that we feel you deserve.  You will note that the calendar groups together  your maintenance  medications that are timed at particular times of the day.  Think of this as your checklist for what your doctor has instructed you to do until your next evaluation to see what benefit  there is  to staying on a consistent group of medications intended to keep you well.  The other group at the bottom is entirely up to you to use as you see fit  for specific symptoms that may arise between visits that require you to treat them on an as needed basis.  Think of this as your action plan or "what if" list.   Separating the top medications from the bottom group is fundamental to providing you adequate care going forward.    Please schedule a follow up visit in 3 months but call sooner if needed

## 2014-10-30 NOTE — Assessment & Plan Note (Signed)
-   PFT's 12/02/2011  FEV1  0.94 (24%) ratio 39 -  18% response to B 2 and DLCO 60%  -Alpha 1 genotype sent 03/01/12 >  MM  -07/14/2013 Med calendar , 10/24/2013  - self titration of steroids with ceiling of pred 20/ floor of zero rec 11/07/13 > did not follow action plan 12/11/2013 > repeated same 01/19/14  - 08/06/2014 p extensive coaching HFA effectiveness =    90%    Adequate control on present rx, reviewed > no change in rx needed     Each maintenance medication was reviewed in detail including most importantly the difference between maintenance and as needed and under what circumstances the prns are to be used. This was done in the context of a medication calendar review which provided the patient with a user-friendly unambiguous mechanism for medication administration and reconciliation and provides an action plan for all active problems. It is critical that this be shown to every doctor  for modification during the office visit if necessary so the patient can use it as a working document.      The goal with a chronic steroid dependent illness is always arriving at the lowest effective dose that controls the disease/symptoms and not accepting a set "formula" which is based on statistics or guidelines that don't always take into account patient  variability or the natural hx of the dz in every individual patient, which may well vary over time.  For now therefore I recommend the patient maintain  A ceiling of 20 and a floor of 10 mg daily

## 2014-10-30 NOTE — Progress Notes (Signed)
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 Minimally better from albuterol.  Much better resp symptoms p prednisone for cluster ha, def improvement in head congestion, also less cough with prednsione.   cc having more cough at hs > white mucus, freq exac in am's, better p coffeee and gets around to taking am meds only an hour or two p rising. rec Stop foradil Start symbicort 160  Take 2 puffs first thing in am and then another 2 puffs about 12 hours later.  Work on inhaler technique:  .    Prednisone 10 mg take  4 each am x 2 days,   2 each am x 2 days,  1 each am x2days and stop Please schedule a follow up office visit in 4 weeks, sooner if needed with pft's        10/24/2013 Follow up and Med review  Patient returns for a two-week followup and medication review. We reviewed all his medications and organized them into a medication calendar with patient education. Patient appears to be taking his medications correctly. Patient has been having a slow to resolve COPD, exacerbation. He was treated with Augmentin  Steroid taper. 3 weeks ago. Patient reports symptoms only minimally improved. He is currently on prednisone 20 mg. Patient was also called in a Z-Pak 10 days ago. Patient reports he did not have any improvement with Z-Pak. He continues to have sinus congestion, drainage, productive cough with rattling mucus. Patient is using Mucinex DM and flutter valve without any help.  Patient reports that he takes his Dulera  and Spiriva on  a consistent basis without any missed doses. Says his wheezing and cough worse in evening.  CT chest done last week showed scattered small subpleural nodules including some in  the right upper lobe which have a somewhat tree-in-bud distribution, suggesting a postinflammatory process. No highly suspicious nodules rec Levaquin 500mg  daily for 7 days  Mucinex DM Twice daily  As needed  Cough/congestion  Continue on Prednisone 20mg  daily until seen back in office   11/07/2013 f/u ov/[name] re: aecopd still not back to baseline/ never went to ER Chief Complaint  Patient presents with  . Follow-up    Cough has improved some- still coughing up minimal white sputum.  SOB much better but not quite back at his normal baseline.   walk x sev hundred feet  Still some congested cough pred still at 20 mg per day  Much less saba dep and no longer needing neb form, just the hfa  rec As per med calendar, when need nebulizer as rescue, take prednisone 20 mg per day until no need, then 10 mg per day x 5 d and stop See calendar for specific medication     Admit date: 01/10/2014  Discharge date: 01/13/2014  Recommendations for Outpatient Follow-up:  1. Please continue spiriva and symbicort as prior to this admission. May also use nebulizer treatments as prescribed .  2. Continue Levaquin for 4 more days  on discharge.  3. Taper prednisone starting from 50 mg a day, taper down by 5 mg a day down to 0 mg and then stop.  4. Please make an appt with cardiology to have outpatient studies done; phone number provided in follow up section of AVS.  Discharge Diagnoses:  Active Problems:  COPD with acute exacerbation  COPD exacerbation  DM (diabetes mellitus)  NSTEMI (non-ST elevated myocardial infarction)  Acute cor pulmonale    01/19/2014 post hosp f/u ov/[name] re: re-establish / continuity of care/ no longer smoking Chief Complaint  Patient presents with  . Follow-up    [facility]  [facility].  Pt c/o fever, SOB, chest tightness.  mucus thick and white No neb needed yet but still on 30 mg per day prednisone  Sob with more than sitting still. rec Prednisone ceiling is 20 mg per day and the floor is 10 mg daily for now See calendar  CTa 01/23/14 > multiple PE  03/30/2014 f/u ov/[name] re: dulera 200 / spiriva 8 am   Then around 3 pm rescue saba hfa but not neb @  pred 10 mg daily  Chief Complaint  Patient presents with  . Follow-up    Pt states his breathing has improved a little since last visit.  C/o SOB with long exertion (more than 100 yards).   uses 02 at 2lpm  Long walks does 2lpm  Walked in room air at 97%  Min cough mucus is white No med calendar rec Only use your 02 during the day if needed     08/06/2014 acute  ov/[name] re: acutely worse sob on pred 10 mg daily plus on dulera and spiriva  Chief Complaint  Patient presents with  . Follow-up    Pt c/o increased chest tightness for the past several days-relates to humidity.  His breathing is unchanged. He is using proair once per day on average, but has not needed neb.   >>increase pred 20mg  with flare    09/17/2014 Follow up and Med Review (Hx of severe COPD- on nocturnal O2,/PE 01/2014/ ? pulm infarct RUL-Xarelto)  Returns for follow up and medication review We reviewed all his meds and organized them into a medication calendar with  Pt education.  It appears that he is taking his medications correctly Wants to try a sample of symbicort to see if any better [facility] .  He remains on prednisone 10 mg daily.  Reports having some good days and bad, more bad days with the weather. rec Continue on current regimen.  May try Symbicort in place of Dulera to see if you like it better, call our office if you want to change.  Follow med calendar closely and bring to each visit.     10/29/2014  NO SHOW   10/30/2014 f/u ov/[name] re: GOLD IV  On symticort 160/ spiriva  Chief Complaint  Patient  presents with  . Follow-up    Pt reports his breathing is unchanged since the last visit. No new co's today. He has not needed neb since last visit, but uses proair once daily on average.   prednisone most days 20 or experiences worse coughs esp at hs  No need for neb alb Uses hfa saba mostly 5-6pm    No obvious day to day or daytime variabilty or assoc excess or purulent sputum  or cp or chest tightness, subjective wheeze overt sinus or hb symptoms. No unusual exp hx or h/o childhood pna/ asthma  or knowledge of premature birth.  Sleeping ok without nocturnal  or early am exacerbation  of respiratory  c/o's or need for noct saba. Also denies any obvious fluctuation of symptoms with weather or environmental changes or other aggravating or alleviating factors except as outlined above   Current Medications, Allergies, Complete Past Medical History, Past Surgical History, Family History, and Social History were reviewed in [facility] electronic medical record.  ROS  The following are not active complaints unless bolded sore throat, dysphagia, dental problems, itching, sneezing,  nasal congestion or excess/ purulent secretions, ear ache,   fever, chills, sweats, unintended wt loss, pleuritic or exertional cp, hemoptysis,  orthopnea pnd or leg swelling, presyncope, palpitations, heartburn, abdominal pain, anorexia, nausea, vomiting, diarrhea  or change in bowel or urinary habits, change in stools or urine, dysuria,hematuria,  rash, arthralgias, visual complaints, headache, numbness weakness or ataxia or problems with walking or coordination,  change in mood/affect or memory.                      Objective:        Wt 188 01/09/12 >  07/27/2012  194 > 196 10/24/2012 > 01/23/2013  202> 200 04/25/2013 > 194 06/13/2013 >164 8/15 > 06/28/2013  191 >199 07/14/2013  >208 10/24/2013 > 11/07/2013  214 > 12/11/2013 208 > 01/19/2014 211 > 01/29/2014 209 > 02/16/2014  218 > 216 03/30/2014 >  08/06/2014  208   >   10/30/2014 211   HEENT mild turbinate edema.  Oropharynx no thrush or excess pnd or cobblestoning.  No JVD or cervical adenopathy. Mild accessory muscle hypertrophy. Trachea midline, nl thryroid. Chest was hyperinflated by percussion with diminished breath sounds and moderate increased exp time without wheeze. Hoover sign positive at mid inspiration. Regular rate and rhythm without murmur gallop or rub or increase P2 or edema.  Abd: no hsm, nl excursion. Ext warm without cyanosis or clubbing.      08/06/14 cxr Biapical densities, right greater than left, apparently slightly progressed in the right apex since prior study. Consider further evaluation with chest CT.                 Assessment & Plan:

## 2014-11-06 ENCOUNTER — Other Ambulatory Visit: Payer: Self-pay

## 2014-11-06 ENCOUNTER — Telehealth: Payer: Self-pay | Admitting: Family

## 2014-11-06 MED ORDER — TRIAMTERENE-HCTZ 37.5-25 MG PO TABS: ORAL_TABLET | ORAL | Status: DC

## 2014-11-06 NOTE — Telephone Encounter (Signed)
Pt needs a refill of Triamterene - BP meds, [facility] on [address].

## 2014-11-06 NOTE — Telephone Encounter (Signed)
Refill sent.

## 2014-11-25 ENCOUNTER — Other Ambulatory Visit: Payer: Self-pay | Admitting: Internal Medicine

## 2014-11-29 ENCOUNTER — Other Ambulatory Visit: Payer: Self-pay | Admitting: Internal Medicine

## 2014-12-04 ENCOUNTER — Ambulatory Visit: Payer: BC Managed Care – PPO | Admitting: Family

## 2014-12-04 ENCOUNTER — Telehealth: Payer: Self-pay | Admitting: Internal Medicine

## 2014-12-04 MED ORDER — TIOTROPIUM BROMIDE MONOHYDRATE 18 MCG IN CAPS: ORAL_CAPSULE | RESPIRATORY_TRACT | Status: DC

## 2014-12-04 NOTE — Telephone Encounter (Signed)
Called pt and aware RX sent in. Nothing further needed 

## 2015-03-08 ENCOUNTER — Ambulatory Visit: Payer: Self-pay | Admitting: Internal Medicine

## 2015-03-22 ENCOUNTER — Other Ambulatory Visit: Payer: Self-pay | Admitting: Internal Medicine

## 2015-03-22 NOTE — Telephone Encounter (Signed)
Xarelto filled #[phone]  No refills Pt has 04/04/15.appt with Wert Pt must keep appt for future refills.

## 2015-03-25 ENCOUNTER — Telehealth: Payer: Self-pay | Admitting: Family

## 2015-03-25 ENCOUNTER — Ambulatory Visit (INDEPENDENT_AMBULATORY_CARE_PROVIDER_SITE_OTHER): Payer: 59 | Admitting: Family

## 2015-03-25 ENCOUNTER — Ambulatory Visit: Payer: Self-pay | Admitting: Internal Medicine

## 2015-03-25 ENCOUNTER — Encounter: Payer: Self-pay | Admitting: Family

## 2015-03-25 ENCOUNTER — Other Ambulatory Visit (INDEPENDENT_AMBULATORY_CARE_PROVIDER_SITE_OTHER): Payer: 59

## 2015-03-25 VITALS — BP 120/68 | HR 80 | Temp 98.5°F | Resp 18 | Ht 76.0 in | Wt 214.8 lb

## 2015-03-25 DIAGNOSIS — E119 Type 2 diabetes mellitus without complications: Secondary | ICD-10-CM

## 2015-03-25 DIAGNOSIS — I10 Essential (primary) hypertension: Secondary | ICD-10-CM | POA: Diagnosis not present

## 2015-03-25 DIAGNOSIS — J449 Chronic obstructive pulmonary disease, unspecified: Secondary | ICD-10-CM

## 2015-03-25 LAB — BASIC METABOLIC PANEL
BUN: 13 mg/dL (ref 6–23)
CALCIUM: 9.7 mg/dL (ref 8.4–10.5)
CO2: 30 mEq/L (ref 19–32)
Chloride: 100 mEq/L (ref 96–112)
Creatinine, Ser: 0.92 mg/dL (ref 0.40–1.50)
GFR: 88.92 mL/min (ref >60.00)
Glucose, Bld: 81 mg/dL (ref 70–99)
Potassium: 3.5 mEq/L (ref 3.5–5.1)
Sodium: 137 mEq/L (ref 135–145)

## 2015-03-25 LAB — HEMOGLOBIN A1C: HEMOGLOBIN A1C: 5.9 % (ref 4.6–6.5)

## 2015-03-25 MED ORDER — ALBUTEROL SULFATE HFA 108 (90 BASE) MCG/ACT IN AERS: 2.0000 | INHALATION_SPRAY | RESPIRATORY_TRACT | Status: DC | PRN

## 2015-03-25 MED ORDER — TRIAMTERENE-HCTZ 37.5-25 MG PO TABS: 1.0000 | ORAL_TABLET | Freq: Every day | ORAL | Status: DC

## 2015-03-25 MED ORDER — FAMOTIDINE 20 MG PO TABS: 20.0000 mg | ORAL_TABLET | Freq: Every day | ORAL | Status: DC

## 2015-03-25 NOTE — Progress Notes (Signed)
Pre visit review using our clinic review tool, if applicable. No additional management support is needed unless otherwise documented below in the visit note. 

## 2015-03-25 NOTE — Assessment & Plan Note (Signed)
Stable with current regimen and below goal of 140/90. Continue current dosage of Maxzide. Monitor blood pressures at home periodically. Obtain BMET to check kidney function. Follow up in 6 months.

## 2015-03-25 NOTE — Progress Notes (Signed)
Subjective:    Patient ID: Theodore [name], male    DOB: [birth year], 59 y.o.   MRN: [id number]  Chief Complaint  Patient presents with  . Follow-up    needs A1c, was told he does not have diabetes his blood sugar was just high, also has not been keeping check of BP    HPI:  Theodore [name] is a 59 y.o. male with a PMH of hypertension, Type 2 diabetes, COPD Gold IV, and pulmonary embolism who presents today for an office follow up.  1) Hypertension - Blood pressure appears to remain in control with current regimen. Takes the medication as prescribed and denies any adverse effects. Denies chest pain/discomfort, shortness of breath, or changes in vision.   BP Readings from Last 3 Encounters:  03/25/15 120/68  10/30/14 130/72  09/17/14 118/78    2) Increased blood sugar - Previously noted to have elevated blood sugars and continues to monitor his A1c periodically. Since his hospitalization with increased blood sugar he has not exceeded 6.5 for his A1c. He is not currently taking any medication.  Lab Results  Component Value Date   HGBA1C 5.9* 01/10/2014    3) COPD - Reports that he is stable with the current regimen of albuterol and Spiriva. Continues to take the medication as prescribed and denies any adverse side effects of medication. In need of referral to pulmonology for his new insurance.    Allergies  Allergen Reactions  . Naproxen Sodium Itching    Current Outpatient Prescriptions on File Prior to Visit  Medication Sig Dispense Refill  . albuterol (PROVENTIL) (2.5 MG/3ML) 0.083% nebulizer solution Take 2.5 mg by nebulization every 6 (six) hours as needed for wheezing or shortness of breath.    . docusate sodium (COLACE) 100 MG capsule Take 100 mg by mouth at bedtime.    Marland Kitchen. guaiFENesin (MUCINEX) 600 MG 12 hr tablet Take 600 mg by mouth every 12 (twelve) hours as needed (w/ flutter valve).     . loratadine (CLARITIN) 10 MG tablet Take 10 mg by mouth daily as needed for  allergies.    . OXYGEN-HELIUM IN Inhale 2 L into the lungs. With exertion, qhs.    . predniSONE (DELTASONE) 10 MG tablet TAKE 1 TABLET BY MOUTH DAILY. MAY TAKE UP TO 2 TABLETS DAILY IF NEEDED 60 tablet 3  . sodium chloride (OCEAN) 0.65 % SOLN nasal spray Place 2 sprays into the nose as needed for congestion.    Marland Kitchen. tiotropium (SPIRIVA HANDIHALER) 18 MCG inhalation capsule inhale the contents of one capsule in the handihaler once daily 30 capsule 11  . tiotropium (SPIRIVA) 18 MCG inhalation capsule Place 18 mcg into inhaler and inhale daily.    Carlena Hurl. XARELTO 20 MG TABS tablet TAKE 1 TABLET BY MOUTH DAILY WITH SUPPER. (START 20MG  AFTER YOU FINISH WITH THE 15MG ) 30 tablet 0   No current facility-administered medications on file prior to visit.    Past Medical History  Diagnosis Date  . COPD (chronic obstructive pulmonary disease)   . Seasonal allergies   . Asthma   . Pulmonary embolism 01/23/2014  . NSTEMI (non-ST elevated myocardial infarction) 01/2014  . Chronic bronchitis   . Pneumonia 06/2013; 01/2014  . GERD (gastroesophageal reflux disease)   . Cluster headache     "last bout was summer 2014; had them q spring 1991-2001" (01/24/2014)  . Arthritis     "back" (01/24/2014)  . Anxiety   . On home oxygen therapy     "  2L; 24/7" (01/24/2014)    Review of Systems  Eyes:       Negative for changes in vision.  Respiratory: Negative for chest tightness and stridor.   Cardiovascular: Negative for chest pain, palpitations and leg swelling.      Objective:    BP 120/68 mmHg  Pulse 80  Temp(Src) 98.5 F (36.9 C) (Oral)  Resp 18  Ht 6\' 4"  (1.93 m)  Wt 214 lb 12.8 oz (97.433 kg)  BMI 26.16 kg/m2  SpO2 97% Nursing note and vital signs reviewed.  Physical Exam  Constitutional: He is oriented to person, place, and time. He appears well-developed and well-nourished. No distress.  Cardiovascular: Normal rate, regular rhythm, normal heart sounds and intact distal pulses.   Pulmonary/Chest: Effort  normal and breath sounds normal.  Neurological: He is alert and oriented to person, place, and time.  Skin: Skin is warm and dry.  Psychiatric: He has a normal mood and affect. His behavior is normal. Judgment and thought content normal.       Assessment & Plan:

## 2015-03-25 NOTE — Telephone Encounter (Signed)
Pt aware of results 

## 2015-03-25 NOTE — Assessment & Plan Note (Signed)
Stable with current regimen of albuterol and Spiriva. Continue current dosages of albuterol and Spirva. Changes per Dr. Sherene SiresWert. Referral to pulmonology placed per patient request. Has upcoming appointment in about 1.5 weeks.

## 2015-03-25 NOTE — Patient Instructions (Signed)
Thank you for choosing [facility].  Summary/Instructions:  Your prescription(s) have been submitted to your pharmacy or been printed and provided for you. Please take as directed and contact our office if you believe you are having problem(s) with the medication(s) or have any questions.  Please stop by the lab on the basement level of the building for your blood work. Your results will be released to MyChart (or called to you) after review, usually within 72 hours after test completion. If any changes need to be made, you will be notified at that same time.  If your symptoms worsen or fail to improve, please contact our office for further instruction, or in case of emergency go directly to the emergency room at the closest medical facility.     

## 2015-03-25 NOTE — Telephone Encounter (Signed)
Please inform patient that his A1c is 5.9 which is unchanged. Therefore we can check this annually as we discussed. Also, his kidney function and electrolytes were both normal. Please have him follow up in 6 months or sooner if needed.

## 2015-03-25 NOTE — Assessment & Plan Note (Signed)
Previous diagnosis of diabetes most likely related to medication side effects as there has been no A1c higher than 6.5. Obtain A1c to check current status and BMET to check kidney function. If this A1c remains below 6.5 will continue to monitor semiannually to annually. Continue current lifestyle management.

## 2015-04-04 ENCOUNTER — Encounter: Payer: Self-pay | Admitting: Internal Medicine

## 2015-04-04 ENCOUNTER — Ambulatory Visit (INDEPENDENT_AMBULATORY_CARE_PROVIDER_SITE_OTHER): Payer: 59 | Admitting: Internal Medicine

## 2015-04-04 VITALS — BP 126/90 | HR 103 | Ht 76.0 in | Wt 216.0 lb

## 2015-04-04 DIAGNOSIS — J9611 Chronic respiratory failure with hypoxia: Secondary | ICD-10-CM

## 2015-04-04 DIAGNOSIS — J449 Chronic obstructive pulmonary disease, unspecified: Secondary | ICD-10-CM

## 2015-04-04 MED ORDER — RIVAROXABAN 20 MG PO TABS: ORAL_TABLET | ORAL | Status: DC

## 2015-04-04 MED ORDER — BUDESONIDE-FORMOTEROL FUMARATE 160-4.5 MCG/ACT IN AERO: INHALATION_SPRAY | RESPIRATORY_TRACT | Status: DC

## 2015-04-04 NOTE — Patient Instructions (Addendum)
See [name] in 6 weeks with all your medications, even over the counter meds, separated in two separate bags, the ones you take no matter what vs the ones you stop once you feel better and take only as needed when you feel you need them.   Theodore [name]  will generate for you a new user friendly medication calendar that will put us all on the same page re: your medication use.     Without this process, it simply isn't possible to assure that we are providing  your outpatient care  with  the attention to detail we feel you deserve.   If we cannot assure that you're getting that kind of care,  then we cannot manage your problem effectively from this clinic.  Once you have seen Theodore [name] and we are sure that we're all on the same page with your medication use she will arrange follow up with me.   Late add no need for 02 x at hs

## 2015-04-04 NOTE — Progress Notes (Signed)
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 Minimally better from albuterol.  Much better resp symptoms p prednisone for cluster ha, def improvement in head congestion, also less cough with prednsione.   cc having more cough at hs > white mucus, freq exac in am's, better p coffeee and gets around to taking am meds only an hour or two p rising. rec Stop foradil Start symbicort 160  Take 2 puffs first thing in am and then another 2 puffs about 12 hours later.  Work on inhaler technique:  .    Prednisone 10 mg take  4 each am x 2 days,   2 each am x 2 days,  1 each am x2days and stop Please schedule a follow up office visit in 4 weeks, sooner if needed with pft's        10/24/2013 Follow up and Med review  Patient returns for a two-week followup and medication review. We reviewed all his medications and organized them into a medication calendar with patient education. Patient appears to be taking his medications correctly. Patient has been having a slow to resolve COPD, exacerbation. He was treated with Augmentin  Steroid taper. 3 weeks ago. Patient reports symptoms only minimally improved. He is currently on prednisone 20 mg. Patient was also called in a Z-Pak 10 days ago. Patient reports he did not have any improvement with Z-Pak. He continues to have sinus congestion, drainage, productive cough with rattling mucus. Patient is using Mucinex DM and flutter valve without any help.  Patient reports that he takes his Dulera  and Spiriva on  a consistent basis without any missed doses. Says his wheezing and cough worse in evening.  CT chest done last week showed scattered small subpleural nodules including some in  the right upper lobe which have a somewhat tree-in-bud distribution, suggesting a postinflammatory process. No highly suspicious nodules rec Levaquin 500mg  daily for 7 days  Mucinex DM Twice daily  As needed  Cough/congestion  Continue on Prednisone 20mg  daily until seen back in office   11/07/2013 f/u ov/Theodore [name] re: aecopd still not back to baseline/ never went to ER Chief Complaint  Patient presents with  . Follow-up    Cough has improved some- still coughing up minimal white sputum.  SOB much better but not quite back at his normal baseline.   walk x sev hundred feet  Still some congested cough pred still at 20 mg per day  Much less saba dep and no longer needing neb form, just the hfa  rec As per med calendar, when need nebulizer as rescue, take prednisone 20 mg per day until no need, then 10 mg per day x 5 d and stop See calendar for specific medication     Admit date: 01/10/2014  Discharge date: 01/13/2014  Recommendations for Outpatient Follow-up:  1. Please continue spiriva and symbicort as prior to this admission. May also use nebulizer treatments as prescribed .  2. Continue Levaquin for 4 more days  on discharge.  3. Taper prednisone starting from 50 mg a day, taper down by 5 mg a day down to 0 mg and then stop.  4. Please make an appt with cardiology to have outpatient studies done; phone number provided in follow up section of AVS.  Discharge Diagnoses:  Active Problems:  COPD with acute exacerbation  COPD exacerbation  DM (diabetes mellitus)  NSTEMI (non-ST elevated myocardial infarction)  Acute cor pulmonale    01/19/2014 post hosp f/u ov/Theodore [name] re: re-establish / continuity of care/ no longer smoking Chief Complaint  Patient presents with  . Follow-up    Pt was hospitalized at [facility] for  respiratory distress.  Pt c/o fever, SOB, chest tightness.  mucus thick and white No neb needed yet but still on 30 mg per day prednisone  Sob with more than sitting still. rec Prednisone ceiling is 20 mg per day and the floor is 10 mg daily for now See calendar  CTa 01/23/14 > multiple PE  03/30/2014 f/u ov/Theodore [name] re: dulera 200 / spiriva 8 am   Then around 3 pm rescue saba hfa but not neb @  pred 10 mg daily  Chief Complaint  Patient presents with  . Follow-up    Pt states his breathing has improved a little since last visit.  C/o SOB with long exertion (more than 100 yards).   uses 02 at 2lpm  Long walks does 2lpm  Walked in room air at 97%  Min cough mucus is white No med calendar rec Only use your 02 during the day if needed     10/29/2014  NO SHOW   10/30/2014 f/u ov/Theodore [name] re: GOLD IV  On symticort 160/ spiriva  Chief Complaint  Patient presents with  . Follow-up    Pt reports his breathing is unchanged since the last visit. No new co's today. He has not needed neb since last visit, but uses proair once daily on average.   prednisone most days 20 or experiences worse coughs esp at hs  No need for neb alb Uses hfa saba mostly 5-6pm   rec If you change your mind about being referred to rehab then let us know Use the flutter valve as much as possible whenever have the urge to cough or sense of retained mucus Follow med calendar    04/04/2015 f/u ov/Theodore [name] re: GOLD IV copd on dulera and spiriva /not keeping up with med calendar Chief Complaint  Patient presents with  . Follow-up    Breathing has improved with warmer weather. He is using ventolin about once per wk and rarely uses neb.    had abd bloating/ vomiting resolved off ppi   Sleeps on 2lpm  Walks at slower than nl pace = mmrc 2 s 02  Taking pred at 10 mg daily floor / occ feels  needs to double it but not following action plans on med calendar   No obvious day to day or daytime variabilty or assoc excess or  purulent sputum  or cp or chest tightness, subjective wheeze overt sinus or hb symptoms. No unusual exp hx or h/o childhood pna/ asthma or knowledge of premature birth.  Sleeping ok without nocturnal  or early am exacerbation  of respiratory  c/o's or need for noct saba. Also denies any obvious fluctuation of symptoms with weather or environmental changes or other aggravating or alleviating factors except as outlined above   Current Medications, Allergies, Complete Past Medical History, Past Surgical History, Family History, and Social History  were reviewed in [facility] electronic medical record.  ROS  The following are not active complaints unless bolded sore throat, dysphagia, dental problems, itching, sneezing,  nasal congestion or excess/ purulent secretions, ear ache,   fever, chills, sweats, unintended wt loss, pleuritic or exertional cp, hemoptysis,  orthopnea pnd or leg swelling, presyncope, palpitations, heartburn, abdominal pain, anorexia, nausea, vomiting, diarrhea  or change in bowel or urinary habits, change in stools or urine, dysuria,hematuria,  rash, arthralgias, visual complaints, headache, numbness weakness or ataxia or problems with walking or coordination,  change in mood/affect or memory.                      Objective:        Wt 188 01/09/12 >  07/27/2012  194 > 196 10/24/2012 > 01/23/2013  202> 200 04/25/2013 > 194 06/13/2013 >164 8/15 > 06/28/2013  191 >199 07/14/2013  >208 10/24/2013 > 11/07/2013  214 > 12/11/2013 208 > 01/19/2014 211 > 01/29/2014 209 > 02/16/2014  218 > 216 03/30/2014 >  08/06/2014  208   >  10/30/2014 211> 04/04/2015   216    HEENT mild turbinate edema.  Oropharynx no thrush or excess pnd or cobblestoning.  No JVD or cervical adenopathy. Mild accessory muscle hypertrophy. Trachea midline, nl thryroid. Chest was hyperinflated by percussion with diminished breath sounds and moderate increased exp time without wheeze. Hoover sign positive at mid inspiration.  Regular rate and rhythm without murmur gallop or rub or increase P2 or edema.  Abd: no hsm, nl excursion. Ext warm without cyanosis or clubbing.      08/06/14 cxr Biapical densities, right greater than left, apparently slightly progressed in the right apex since prior study. Consider further evaluation with chest CT.                 Assessment & Plan:

## 2015-04-08 ENCOUNTER — Encounter: Payer: Self-pay | Admitting: Internal Medicine

## 2015-04-08 NOTE — Assessment & Plan Note (Signed)
-   PFT's 12/02/2011  FEV1  0.94 (24%) ratio 39 -  18% response to B 2 and DLCO 60%  -Alpha 1 genotype sent 03/01/12 >  MM  -07/14/2013 Med calendar , 10/24/2013  - self titration of steroids with ceiling of pred 20/ floor of zero rec 11/07/13 > did not follow action plan 12/11/2013 > repeated same 01/19/14  - 08/06/2014 p extensive coaching HFA effectiveness =    90%   I had an extended discussion with the patient reviewing all relevant studies completed to date and  lasting 15 to 20 minutes of a 25 minute visit on the following ongoing concerns:  1) he has very severe dz and extremely complex rx and needs to follow med calendar maint vs action plans at bottom to prevent exac  2) might be a good candidate for daliresp to wean off pred if can first do full med reconciliation  3)  To keep things simple, I have asked the patient to first separate medicines that are perceived as maintenance, that is to be taken daily "no matter what", from those medicines that are taken on only on an as-needed basis and I have given the patient examples of both, and then return to see our NP to generate a  detailed  medication calendar which should be followed until the next physician sees the patient and updates it.    4) Each maintenance medication was reviewed in detail including most importantly the difference between maintenance and as needed and under what circumstances the prns are to be used. This was done in the context of a medication calendar review which provided the patient with a user-friendly unambiguous mechanism for medication administration and reconciliation and provides an action plan for all active problems. It is critical that this be shown to every doctor  for modification during the office visit if necessary so the patient can use it as a working document.

## 2015-04-08 NOTE — Assessment & Plan Note (Signed)
-   See echo 01/11/14 c/w cor pulmonale - rx 2lpm since 01/10/14 > changed to hs  02/16/14  Plus prn daytime - 03/30/2014  Walked RA x 3 laps @ 185 ft each stopped due to  End of study no desat - 04/04/2015  Walked RA x 3 laps @ 185 ft each stopped due to  End of study, nl pace, no desat   rec 2lpm hs only as of 04/04/15

## 2015-05-01 ENCOUNTER — Other Ambulatory Visit: Payer: 59

## 2015-05-01 ENCOUNTER — Telehealth: Payer: Self-pay

## 2015-05-01 NOTE — Telephone Encounter (Signed)
Pt will come in for walk in lab

## 2015-05-03 ENCOUNTER — Other Ambulatory Visit: Payer: Self-pay | Admitting: Internal Medicine

## 2015-05-16 ENCOUNTER — Ambulatory Visit: Payer: 59 | Admitting: Adult Health

## 2015-10-17 ENCOUNTER — Telehealth: Payer: Self-pay | Admitting: Family

## 2015-10-17 ENCOUNTER — Telehealth: Payer: Self-pay

## 2015-10-17 ENCOUNTER — Ambulatory Visit (INDEPENDENT_AMBULATORY_CARE_PROVIDER_SITE_OTHER): Payer: 59

## 2015-10-17 DIAGNOSIS — Z23 Encounter for immunization: Secondary | ICD-10-CM

## 2015-10-17 MED ORDER — ALBUTEROL SULFATE HFA 108 (90 BASE) MCG/ACT IN AERS: 2.0000 | INHALATION_SPRAY | RESPIRATORY_TRACT | Status: DC | PRN

## 2015-10-17 NOTE — Telephone Encounter (Signed)
Patient requesting refill for prednisone---last OV 03/2015, next appt with you tomorrow 10/18/15---are you ok with refilling, please advise, thanks

## 2015-10-17 NOTE — Telephone Encounter (Signed)
Patient walked in. Advised that he stepped on his albuterol (PROVENTIL HFA;VENTOLIN HFA) 108 (90 BASE) MCG/ACT inhaler [[id number]][[id number]]. States that he needs a new one [facility] to [facility] [address]. i added that pharmacy to preferred list.   Patient also states that he needs a replacement mask for his oxygen machine. He states that he got the RX from pulmonary, but i was unable to find the exact product in the chart. He does still have the mask that's broken. Will schedule a follow up for him and advise to bring it with him. He is also asking for a new referral to pulmonary (not with dr wert)

## 2015-10-17 NOTE — Telephone Encounter (Signed)
Albuterol sent to pharmacy. Generally pulmonary handles the masks for the CPAP/BiPAP. He should be able to schedule with pulmonology and request another provider without a new referral.

## 2015-10-18 ENCOUNTER — Encounter: Payer: Self-pay | Admitting: Family

## 2015-10-18 ENCOUNTER — Ambulatory Visit (INDEPENDENT_AMBULATORY_CARE_PROVIDER_SITE_OTHER): Payer: 59 | Admitting: Family

## 2015-10-18 VITALS — BP 110/78 | HR 64 | Temp 98.3°F | Resp 18 | Ht 76.0 in | Wt 215.4 lb

## 2015-10-18 DIAGNOSIS — J449 Chronic obstructive pulmonary disease, unspecified: Secondary | ICD-10-CM

## 2015-10-18 MED ORDER — PREDNISONE 10 MG PO TABS
10.0000 mg | ORAL_TABLET | Freq: Two times a day (BID) | ORAL | Status: DC | PRN
Start: 2015-10-18 — End: 2015-12-17

## 2015-10-18 NOTE — Addendum Note (Signed)
Addended by: [name], GREGORY D on: 10/18/2015 02:57 PM   Modules accepted: Orders

## 2015-10-18 NOTE — Patient Instructions (Addendum)
Thank you for choosing [facility].  Summary/Instructions:  Your prescription(s) have been submitted to your pharmacy or been printed and provided for you. Please take as directed and contact our office if you believe you are having problem(s) with the medication(s) or have any questions.  If your symptoms worsen or fail to improve, please contact our office for further instruction, or in case of emergency go directly to the emergency room at the closest medical facility.   Chronic Obstructive Pulmonary Disease Chronic obstructive pulmonary disease (COPD) is a common lung condition in which airflow from the lungs is limited. COPD is a general term that can be used to describe many different lung problems that limit airflow, including both chronic bronchitis and emphysema. If you have COPD, your lung function will probably never return to normal, but there are measures you can take to improve lung function and make yourself feel better. CAUSES   Smoking (common).  Exposure to secondhand smoke.  Genetic problems.  Chronic inflammatory lung diseases or recurrent infections. SYMPTOMS  Shortness of breath, especially with physical activity.  Deep, persistent (chronic) cough with a large amount of thick mucus.  Wheezing.  Rapid breaths (tachypnea).  Gray or bluish discoloration (cyanosis) of the skin, especially in your fingers, toes, or lips.  Fatigue.  Weight loss.  Frequent infections or episodes when breathing symptoms become much worse (exacerbations).  Chest tightness. DIAGNOSIS Your health care provider will take a medical history and perform a physical examination to diagnose COPD. Additional tests for COPD may include:  Lung (pulmonary) function tests.  Chest X-ray.  CT scan.  Blood tests. TREATMENT  Treatment for COPD may include:  Inhaler and nebulizer medicines. These help manage the symptoms of COPD and make your breathing more  comfortable.  Supplemental oxygen. Supplemental oxygen is only helpful if you have a low oxygen level in your blood.  Exercise and physical activity. These are beneficial for nearly all people with COPD.  Lung surgery or transplant.  Nutrition therapy to gain weight, if you are underweight.  Pulmonary rehabilitation. This may involve working with a team of health care providers and specialists, such as respiratory, occupational, and physical therapists. HOME CARE INSTRUCTIONS  Take all medicines (inhaled or pills) as directed by your health care provider.  Avoid over-the-counter medicines or cough syrups that dry up your airway (such as antihistamines) and slow down the elimination of secretions unless instructed otherwise by your health care provider.  If you are a smoker, the most important thing that you can do is stop smoking. Continuing to smoke will cause further lung damage and breathing trouble. Ask your health care provider for help with quitting smoking. He or she can direct you to community resources or hospitals that provide support.  Avoid exposure to irritants such as smoke, chemicals, and fumes that aggravate your breathing.  Use oxygen therapy and pulmonary rehabilitation if directed by your health care provider. If you require home oxygen therapy, ask your health care provider whether you should purchase a pulse oximeter to measure your oxygen level at home.  Avoid contact with individuals who have a contagious illness.  Avoid extreme temperature and humidity changes.  Eat healthy foods. Eating smaller, more frequent meals and resting before meals may help you maintain your strength.  Stay active, but balance activity with periods of rest. Exercise and physical activity will help you maintain your ability to do things you want to do.  Preventing infection and hospitalization is very important when you have   COPD. Make sure to receive all the vaccines your health care  provider recommends, especially the pneumococcal and influenza vaccines. Ask your health care provider whether you need a pneumonia vaccine.  Learn and use relaxation techniques to manage stress.  Learn and use controlled breathing techniques as directed by your health care provider. Controlled breathing techniques include:  Pursed lip breathing. Start by breathing in (inhaling) through your nose for 1 second. Then, purse your lips as if you were going to whistle and breathe out (exhale) through the pursed lips for 2 seconds.  Diaphragmatic breathing. Start by putting one hand on your abdomen just above your waist. Inhale slowly through your nose. The hand on your abdomen should move out. Then purse your lips and exhale slowly. You should be able to feel the hand on your abdomen moving in as you exhale.  Learn and use controlled coughing to clear mucus from your lungs. Controlled coughing is a series of short, progressive coughs. The steps of controlled coughing are: 1. Lean your head slightly forward. 2. Breathe in deeply using diaphragmatic breathing. 3. Try to hold your breath for 3 seconds. 4. Keep your mouth slightly open while coughing twice. 5. Spit any mucus out into a tissue. 6. Rest and repeat the steps once or twice as needed. SEEK MEDICAL CARE IF:  You are coughing up more mucus than usual.  There is a change in the color or thickness of your mucus.  Your breathing is more labored than usual.  Your breathing is faster than usual. SEEK IMMEDIATE MEDICAL CARE IF:  You have shortness of breath while you are resting.  You have shortness of breath that prevents you from:  Being able to talk.  Performing your usual physical activities.  You have chest pain lasting longer than 5 minutes.  Your skin color is more cyanotic than usual.  You measure low oxygen saturations for longer than 5 minutes with a pulse oximeter. MAKE SURE YOU:  Understand these  instructions.  Will watch your condition.  Will get help right away if you are not doing well or get worse.   This information is not intended to replace advice given to you by your health care provider. Make sure you discuss any questions you have with your health care provider.   Document Released: 08/05/2005 Document Revised: 11/16/2014 Document Reviewed: 06/22/2013 Elsevier Interactive Patient Education 2016 [employer] Inc.   

## 2015-10-18 NOTE — Progress Notes (Signed)
Pre visit review using our clinic review tool, if applicable. No additional management support is needed unless otherwise documented below in the visit note. 

## 2015-10-18 NOTE — Assessment & Plan Note (Addendum)
COPD appears stable with no evidence of exacerbation or infection with need for supplies. Prescription written for new nebulizer and face mask per patient request. Continue current dosage of Spiriva, Albuterol and Symbicort. Follow up with pulmonology for monitoring.

## 2015-10-18 NOTE — Progress Notes (Signed)
Subjective:    Patient ID: Theodore [name], male    DOB: 1956-09-22, 59 y.o.   MRN: [id number]  Chief Complaint  Patient presents with  . Follow-up    rx for new equipment and nebulizer, need o2 mask    HPI:  Theodore [name] is a 59 y.o. male who  has a past medical history of COPD (chronic obstructive pulmonary disease) (HCC); Seasonal allergies; Asthma; Pulmonary embolism (HCC) (01/23/2014); NSTEMI (non-ST elevated myocardial infarction) (HCC) (01/2014); Chronic bronchitis (HCC); Pneumonia (06/2013; 01/2014); GERD (gastroesophageal reflux disease); Cluster headache; Arthritis; Anxiety; and On home oxygen therapy. and presents today for a follow up office visit.                                            1.) COPD Gold IV - Currently maintained on Symbicort, Spiriva, and albuterol. Also has albuterol nebulizers as needed if inhalers are not working. Takes the medications as prescribed. Indicates the medications adequately control his breathing but notes that he has had more bad days recently. Does use oxygen at home via simple face mask at 2L at night. Requesting a new nebulizer and referral to Pulmonology to follow up. He notes that he has some decreased function in performing his daily activities describing feeling winded from the car to the store where he used to be able to shop without getting short of breath.   Allergies  Allergen Reactions  . Naproxen Sodium Itching     Current Outpatient Prescriptions on File Prior to Visit  Medication Sig Dispense Refill  . albuterol (PROVENTIL HFA;VENTOLIN HFA) 108 (90 BASE) MCG/ACT inhaler Inhale 2 puffs into the lungs every 4 (four) hours as needed for wheezing or shortness of breath. 1 Inhaler 4  . albuterol (PROVENTIL) (2.5 MG/3ML) 0.083% nebulizer solution Take 2.5 mg by nebulization every 6 (six) hours as needed for wheezing or shortness of breath.    . budesonide-formoterol (SYMBICORT) 160-4.5 MCG/ACT inhaler Take 2 puffs first thing in am  and then another 2 puffs about 12 hours later. 1 Inhaler 11  . guaiFENesin (MUCINEX) 600 MG 12 hr tablet Take 600 mg by mouth every 12 (twelve) hours as needed (w/ flutter valve).     . OXYGEN-HELIUM IN Inhale 2 L into the lungs at bedtime.     . predniSONE (DELTASONE) 10 MG tablet TAKE 1 TABLET BY MOUTH DAILY. MAY TAKE UP TO 2 TABLETS DAILY IF NEEDED 60 tablet 3  . rivaroxaban (XARELTO) 20 MG TABS tablet TAKE 1 TABLET BY MOUTH DAILY WITH SUPPER. (START  AFTER YOU FINISH WITH THE ) 30 tablet 11  . sodium chloride (OCEAN) 0.65 % SOLN nasal spray Place 2 sprays into the nose as needed for congestion.    Marland Kitchen tiotropium (SPIRIVA) 18 MCG inhalation capsule Place 18 mcg into inhaler and inhale daily.    Marland Kitchen triamterene-hydrochlorothiazide (MAXZIDE-25) 37.5-25 MG per tablet Take 1 tablet by mouth daily. 90 tablet 1   No current facility-administered medications on file prior to visit.    Review of Systems  Constitutional: Negative for fever and chills.  HENT: Negative for congestion.   Respiratory: Negative for chest tightness, shortness of breath and wheezing.       Objective:    BP 110/78 mmHg  Pulse 64  Temp(Src) 98.3 F (36.8 C) (Oral)  Resp 18  Ht  (1.93 m)  Wt  215 lb 6.4 oz (97.705 kg)  BMI 26.23 kg/m2  SpO2 97% Nursing note and vital signs reviewed.  Physical Exam  Constitutional: He is oriented to person, place, and time. He appears well-developed and well-nourished. No distress.  Cardiovascular: Normal rate, regular rhythm, normal heart sounds and intact distal pulses.   Pulmonary/Chest: Effort normal and breath sounds normal. No respiratory distress. He has no wheezes. He has no rales. He exhibits no tenderness.  Neurological: He is alert and oriented to person, place, and time.  Skin: Skin is warm and dry.  Psychiatric: He has a normal mood and affect. His behavior is normal. Judgment and thought content normal.       Assessment & Plan:   Problem List Items  Addressed This Visit      Respiratory   COPD GOLD IV  - Primary    COPD appears stable with no evidence of exacerbation or infection with need for supplies. Prescription written for new nebulizer and face mask per patient request. Continue current dosage of Spiriva, Albuterol and Symbicort. Follow up with pulmonology for monitoring.

## 2015-10-23 ENCOUNTER — Ambulatory Visit (INDEPENDENT_AMBULATORY_CARE_PROVIDER_SITE_OTHER): Payer: 59 | Admitting: Licensed Clinical Social Worker

## 2015-10-23 DIAGNOSIS — F411 Generalized anxiety disorder: Secondary | ICD-10-CM

## 2015-10-23 DIAGNOSIS — F332 Major depressive disorder, recurrent severe without psychotic features: Secondary | ICD-10-CM | POA: Diagnosis not present

## 2015-10-30 ENCOUNTER — Encounter: Payer: Self-pay | Admitting: Family

## 2015-10-30 ENCOUNTER — Ambulatory Visit (INDEPENDENT_AMBULATORY_CARE_PROVIDER_SITE_OTHER): Payer: 59 | Admitting: Family

## 2015-10-30 VITALS — BP 130/84 | HR 62 | Temp 98.5°F | Resp 16 | Ht 76.0 in | Wt 213.8 lb

## 2015-10-30 DIAGNOSIS — F411 Generalized anxiety disorder: Secondary | ICD-10-CM | POA: Diagnosis not present

## 2015-10-30 MED ORDER — ESCITALOPRAM OXALATE 10 MG PO TABS: 10.0000 mg | ORAL_TABLET | Freq: Every day | ORAL | Status: DC

## 2015-10-30 MED ORDER — ALPRAZOLAM 0.25 MG PO TABS: 0.2500 mg | ORAL_TABLET | Freq: Two times a day (BID) | ORAL | Status: DC | PRN

## 2015-10-30 NOTE — Progress Notes (Signed)
Pre visit review using our clinic review tool, if applicable. No additional management support is needed unless otherwise documented below in the visit note. 

## 2015-10-30 NOTE — Assessment & Plan Note (Signed)
Symptoms and exam consistent with anxiety and possibly anxiety attacks. Currently working with psychology for symptom management. Start Lexapro daily and Xanax as needed. Discussed importance of stress and stress management. Follow-up in one month to determine effectiveness.

## 2015-10-30 NOTE — Progress Notes (Signed)
Subjective:    Patient ID: Theodore [name], male    DOB: January 16, 1956, 59 y.o.   MRN: [id number]  Chief Complaint  Patient presents with  . Med consult    went to speak to therapist about anxiety and was told to come get on something for depression (lexapro)    HPI:  Theodore GuestJames W Aubry is a 59 y.o. male who  has a past medical history of COPD (chronic obstructive pulmonary disease) (HCC); Seasonal allergies; Asthma; Pulmonary embolism (HCC) (01/23/2014); NSTEMI (non-ST elevated myocardial infarction) (HCC) (01/2014); Chronic bronchitis (HCC); Pneumonia (06/2013; 01/2014); GERD (gastroesophageal reflux disease); Cluster headache; Arthritis; Anxiety; and On home oxygen therapy. and presents today for a follow up office visit.   Recently seen by a counselor for the associated symptom of anxiety who recommended follow up for potential medication therapy. This has been going on for a couple of years and was exacerbated about 3 weeks ago when he had issues with trouble breathing, some mild chest discomfort and felt like things were closing in on him. The frequency of these episodes occurs almost daily with varying severity/intensity. Modifying factors include low dose of Xanax that was previously prescribed and did help on occasion. Stopped the medication secondary to outside social support.   Allergies  Allergen Reactions  . Naproxen Sodium Itching     Current Outpatient Prescriptions on File Prior to Visit  Medication Sig Dispense Refill  . albuterol (PROVENTIL HFA;VENTOLIN HFA) 108 (90 BASE) MCG/ACT inhaler Inhale 2 puffs into the lungs every 4 (four) hours as needed for wheezing or shortness of breath. 1 Inhaler 4  . albuterol (PROVENTIL) (2.5 MG/3ML) 0.083% nebulizer solution Take 2.5 mg by nebulization every 6 (six) hours as needed for wheezing or shortness of breath.    . budesonide-formoterol (SYMBICORT) 160-4.5 MCG/ACT inhaler Take 2 puffs first thing in am and then another 2 puffs about 12  hours later. 1 Inhaler 11  . guaiFENesin (MUCINEX) 600 MG 12 hr tablet Take 600 mg by mouth every 12 (twelve) hours as needed (w/ flutter valve).     . OXYGEN-HELIUM IN Inhale 2 L into the lungs at bedtime.     . predniSONE (DELTASONE) 10 MG tablet Take 1 tablet (10 mg total) by mouth 2 (two) times daily as needed. 60 tablet 2  . rivaroxaban (XARELTO) 20 MG TABS tablet TAKE 1 TABLET BY MOUTH DAILY WITH SUPPER. (START 20MG  AFTER YOU FINISH WITH THE 15MG ) 30 tablet 11  . sodium chloride (OCEAN) 0.65 % SOLN nasal spray Place 2 sprays into the nose as needed for congestion.    Marland Kitchen. tiotropium (SPIRIVA) 18 MCG inhalation capsule Place 18 mcg into inhaler and inhale daily.    Marland Kitchen. triamterene-hydrochlorothiazide (MAXZIDE-25) 37.5-25 MG per tablet Take 1 tablet by mouth daily. 90 tablet 1   No current facility-administered medications on file prior to visit.    Review of Systems  Constitutional: Negative for fever and chills.  Respiratory: Positive for shortness of breath. Negative for chest tightness.   Psychiatric/Behavioral: Negative for suicidal ideas and sleep disturbance. The patient is nervous/anxious.       Objective:    BP 130/84 mmHg  Pulse 62  Temp(Src) 98.5 F (36.9 C) (Oral)  Resp 16  Ht 6\' 4"  (1.93 m)  Wt 213 lb 12.8 oz (96.979 kg)  BMI 26.04 kg/m2  SpO2 96% Nursing note and vital signs reviewed.  Physical Exam  Constitutional: He is oriented to person, place, and time. He appears well-developed and well-nourished.  No distress.  Cardiovascular: Normal rate, regular rhythm, normal heart sounds and intact distal pulses.   Pulmonary/Chest: Effort normal and breath sounds normal.  Neurological: He is alert and oriented to person, place, and time.  Skin: Skin is warm and dry.  Psychiatric: His behavior is normal. Judgment and thought content normal. His mood appears anxious.       Assessment & Plan:   Problem List Items Addressed This Visit      Other   Anxiety state -  Primary    Symptoms and exam consistent with anxiety and possibly anxiety attacks. Currently working with psychology for symptom management. Start Lexapro daily and Xanax as needed. Discussed importance of stress and stress management. Follow-up in one month to determine effectiveness.      Relevant Medications   ALPRAZolam (XANAX) 0.25 MG tablet   escitalopram (LEXAPRO) 10 MG tablet

## 2015-10-30 NOTE — Patient Instructions (Addendum)
Thank you for choosing [facility].  Summary/Instructions:  Your prescription(s) have been submitted to your pharmacy or been printed and provided for you. Please take as directed and contact our office if you believe you are having problem(s) with the medication(s) or have any questions.  If your symptoms worsen or fail to improve, please contact our office for further instruction, or in case of emergency go directly to the emergency room at the closest medical facility.   Generalized Anxiety Disorder Generalized anxiety disorder (GAD) is a mental disorder. It interferes with life functions, including relationships, work, and school. GAD is different from normal anxiety, which everyone experiences at some point in their lives in response to specific life events and activities. Normal anxiety actually helps [place] prepare for and get through these life events and activities. Normal anxiety goes away after the event or activity is over.  GAD causes anxiety that is not necessarily related to specific events or activities. It also causes excess anxiety in proportion to specific events or activities. The anxiety associated with GAD is also difficult to control. GAD can vary from mild to severe. People with severe GAD can have intense waves of anxiety with physical symptoms (panic attacks).  SYMPTOMS The anxiety and worry associated with GAD are difficult to control. This anxiety and worry are related to many life events and activities and also occur more days than not for 6 months or longer. People with GAD also have three or more of the following symptoms (one or more in children):  Restlessness.   Fatigue.  Difficulty concentrating.   Irritability.  Muscle tension.  Difficulty sleeping or unsatisfying sleep. DIAGNOSIS GAD is diagnosed through an assessment by your health care provider. Your health care provider will ask you questions aboutyour mood,physical symptoms, and events in  your life. Your health care provider may ask you about your medical history and use of alcohol or drugs, including prescription medicines. Your health care provider may also do a physical exam and blood tests. Certain medical conditions and the use of certain substances can cause symptoms similar to those associated with GAD. Your health care provider may refer you to a mental health specialist for further evaluation. TREATMENT The following therapies are usually used to treat GAD:   Medication. Antidepressant medication usually is prescribed for long-term daily control. Antianxiety medicines may be added in severe cases, especially when panic attacks occur.   Talk therapy (psychotherapy). Certain types of talk therapy can be helpful in treating GAD by providing support, education, and guidance. A form of talk therapy called cognitive behavioral therapy can teach you healthy ways to think about and react to daily life events and activities.  Stress managementtechniques. These include yoga, meditation, and exercise and can be very helpful when they are practiced regularly. A mental health specialist can help determine which treatment is best for you. Some people see improvement with one therapy. However, other people require a combination of therapies.   This information is not intended to replace advice given to you by your health care provider. Make sure you discuss any questions you have with your health care provider.   Document Released: 02/20/2013 Document Revised: 11/16/2014 Document Reviewed: 02/20/2013 Elsevier Interactive Patient Education 2016 [place] and Stress Management Stress is a normal reaction to life events. It is what you feel when life demands more than you are used to or more than you can handle. Some stress can be useful. For example, the stress reaction can help  you catch the last bus of the day, study for a test, or meet a deadline at work. But stress that  occurs too often or for too long can cause problems. It can affect your emotional health and interfere with relationships and normal daily activities. Too much stress can weaken your immune system and increase your risk for physical illness. If you already have a medical problem, stress can make it worse. CAUSES  All sorts of life events may cause stress. An event that causes stress for one person may not be stressful for another person. Major life events commonly cause stress. These may be positive or negative. Examples include losing your job, moving into a new home, getting married, having a baby, or losing a loved one. Less obvious life events may also cause stress, especially if they occur day after day or in combination. Examples include working long hours, driving in traffic, caring for children, being in debt, or being in a difficult relationship. SIGNS AND SYMPTOMS Stress may cause emotional symptoms including, the following:  Anxiety. This is feeling worried, afraid, on edge, overwhelmed, or out of control.  Anger. This is feeling irritated or impatient.  Depression. This is feeling sad, down, helpless, or guilty.  Difficulty focusing, remembering, or making decisions. Stress may cause physical symptoms, including the following:   Aches and pains. These may affect your head, neck, back, stomach, or other areas of your body.  Tight muscles or clenched jaw.  Low energy or trouble sleeping. Stress may cause unhealthy behaviors, including the following:   Eating to feel better (overeating) or skipping meals.  Sleeping too little, too much, or both.  Working too much or putting off tasks (procrastination).  Smoking, drinking alcohol, or using drugs to feel better. DIAGNOSIS  Stress is diagnosed through an assessment by your health care provider. Your health care provider will ask questions about your symptoms and any stressful life events.Your health care provider will also ask  about your medical history and may order blood tests or other tests. Certain medical conditions and medicine can cause physical symptoms similar to stress. Mental illness can cause emotional symptoms and unhealthy behaviors similar to stress. Your health care provider may refer you to a mental health professional for further evaluation.  TREATMENT  Stress management is the recommended treatment for stress.The goals of stress management are reducing stressful life events and coping with stress in healthy ways.  Techniques for reducing stressful life events include the following:  Stress identification. Self-monitor for stress and identify what causes stress for you. These skills may help you to avoid some stressful events.  Time management. Set your priorities, keep a calendar of events, and learn to say "no." These tools can help you avoid making too many commitments. Techniques for coping with stress include the following:  Rethinking the problem. Try to think realistically about stressful events rather than ignoring them or overreacting. Try to find the positives in a stressful situation rather than focusing on the negatives.  Exercise. Physical exercise can release both physical and emotional tension. The key is to find a form of exercise you enjoy and do it regularly.  Relaxation techniques. These relax the body and mind. Examples include yoga, meditation, tai chi, biofeedback, deep breathing, progressive muscle relaxation, listening to music, being out in nature, journaling, and other hobbies. Again, the key is to find one or more that you enjoy and can do regularly.  Healthy lifestyle. Eat a balanced diet, get plenty of sleep, and  do not smoke. Avoid using alcohol or drugs to relax.  Strong support network. Spend time with family, friends, or other people you enjoy being around.Express your feelings and talk things over with someone you trust. Counseling or talktherapy with a mental  health professional may be helpful if you are having difficulty managing stress on your own. Medicine is typically not recommended for the treatment of stress.Talk to your health care provider if you think you need medicine for symptoms of stress. HOME CARE INSTRUCTIONS  Keep all follow-up visits as directed by your health care provider.  Take all medicines as directed by your health care provider. SEEK MEDICAL CARE IF:  Your symptoms get worse or you start having new symptoms.  You feel overwhelmed by your problems and can no longer manage them on your own. SEEK IMMEDIATE MEDICAL CARE IF:  You feel like hurting yourself or someone else.   This information is not intended to replace advice given to you by your health care provider. Make sure you discuss any questions you have with your health care provider.   Document Released: 04/21/2001 Document Revised: 11/16/2014 Document Reviewed: 06/20/2013 Elsevier Interactive Patient Education [employer].

## 2015-11-20 ENCOUNTER — Other Ambulatory Visit: Payer: Self-pay | Admitting: Internal Medicine

## 2015-12-17 ENCOUNTER — Other Ambulatory Visit: Payer: Self-pay | Admitting: Internal Medicine

## 2015-12-19 ENCOUNTER — Other Ambulatory Visit: Payer: Self-pay | Admitting: Family

## 2016-02-20 ENCOUNTER — Other Ambulatory Visit: Payer: Self-pay | Admitting: Internal Medicine

## 2016-04-18 ENCOUNTER — Other Ambulatory Visit: Payer: Self-pay | Admitting: Family

## 2016-04-18 ENCOUNTER — Other Ambulatory Visit: Payer: Self-pay | Admitting: Internal Medicine

## 2016-04-23 ENCOUNTER — Telehealth: Payer: Self-pay | Admitting: Internal Medicine

## 2016-04-23 NOTE — Telephone Encounter (Addendum)
Spoke with the pt He is asking to switch providers  MW is this okay? He does not have a preference on who he sees, thanks   See [name] in 6 weeks with all your medications, even over the counter meds, separated in two separate bags, the ones you take no matter what vs the ones you stop once you feel better and take only as needed when you feel you need them. Tammy will generate for you a new user friendly medication calendar that will put us all on the same page re: your medication use.    Without this process, it simply isn't possible to assure that we are providing your outpatient care with the attention to detail we feel you deserve. If we cannot assure that you're getting that kind of care, then we cannot manage your problem effectively from this clinic.  Once you have seen Tammy and we are sure that we're all on the same page with your medication use she will arrange follow up with me.   Late add no need for 02 x at hs

## 2016-04-24 NOTE — Telephone Encounter (Signed)
Spoke with pt. He is aware that MW is okay with the switch. BQ is the next one with an available appointment.  BQ - are you okay with this switch?

## 2016-04-24 NOTE — Telephone Encounter (Signed)
Fine with me

## 2016-04-27 MED ORDER — RIVAROXABAN 20 MG PO TABS: ORAL_TABLET | ORAL | Status: DC

## 2016-04-27 MED ORDER — BUDESONIDE-FORMOTEROL FUMARATE 160-4.5 MCG/ACT IN AERO: INHALATION_SPRAY | RESPIRATORY_TRACT | Status: DC

## 2016-04-27 NOTE — Telephone Encounter (Signed)
Spoke with pt and advised of MD switch approval. Pt scheduled 06/09/16 with BQ. Pt requesting refills on Symbicort and Xarelto. Refills sent in x2. Nothing further needed.

## 2016-04-27 NOTE — Telephone Encounter (Signed)
OK by me 

## 2016-04-30 ENCOUNTER — Ambulatory Visit (INDEPENDENT_AMBULATORY_CARE_PROVIDER_SITE_OTHER): Payer: BLUE CROSS/BLUE SHIELD | Admitting: Family

## 2016-04-30 ENCOUNTER — Encounter: Payer: Self-pay | Admitting: Family

## 2016-04-30 ENCOUNTER — Other Ambulatory Visit (INDEPENDENT_AMBULATORY_CARE_PROVIDER_SITE_OTHER): Payer: BLUE CROSS/BLUE SHIELD

## 2016-04-30 VITALS — BP 118/72 | HR 98 | Temp 98.2°F | Resp 18 | Ht 76.0 in | Wt 217.0 lb

## 2016-04-30 DIAGNOSIS — J449 Chronic obstructive pulmonary disease, unspecified: Secondary | ICD-10-CM

## 2016-04-30 DIAGNOSIS — E119 Type 2 diabetes mellitus without complications: Secondary | ICD-10-CM

## 2016-04-30 DIAGNOSIS — Z7289 Other problems related to lifestyle: Secondary | ICD-10-CM | POA: Diagnosis not present

## 2016-04-30 DIAGNOSIS — Z0000 Encounter for general adult medical examination without abnormal findings: Secondary | ICD-10-CM

## 2016-04-30 DIAGNOSIS — I2510 Atherosclerotic heart disease of native coronary artery without angina pectoris: Secondary | ICD-10-CM

## 2016-04-30 DIAGNOSIS — Z1211 Encounter for screening for malignant neoplasm of colon: Secondary | ICD-10-CM | POA: Diagnosis not present

## 2016-04-30 LAB — COMPREHENSIVE METABOLIC PANEL
ALBUMIN: 3.9 g/dL (ref 3.5–5.2)
ALK PHOS: 50 U/L (ref 39–117)
ALT: 16 U/L (ref 0–53)
AST: 15 U/L (ref 0–37)
BUN: 13 mg/dL (ref 6–23)
CO2: 28 mEq/L (ref 19–32)
Calcium: 9 mg/dL (ref 8.4–10.5)
Chloride: 104 mEq/L (ref 96–112)
Creatinine, Ser: 1.05 mg/dL (ref 0.40–1.50)
GFR: 76.06 mL/min (ref >60.00)
Glucose, Bld: 86 mg/dL (ref 70–99)
POTASSIUM: 3.8 meq/L (ref 3.5–5.1)
SODIUM: 137 meq/L (ref 135–145)
TOTAL PROTEIN: 6.4 g/dL (ref 6.0–8.3)
Total Bilirubin: 0.6 mg/dL (ref 0.2–1.2)

## 2016-04-30 LAB — LIPID PANEL
CHOLESTEROL: 217 mg/dL — AB (ref 0–200)
HDL: 61.7 mg/dL (ref >39.00)
LDL Cholesterol: 135 mg/dL — ABNORMAL HIGH (ref 0–99)
NonHDL: 154.97
Total CHOL/HDL Ratio: 4
Triglycerides: 102 mg/dL (ref 0.0–149.0)
VLDL: 20.4 mg/dL (ref 0.0–40.0)

## 2016-04-30 LAB — MICROALBUMIN / CREATININE URINE RATIO
Creatinine,U: 228.7 mg/dL
MICROALB UR: 0.8 mg/dL (ref 0.0–1.9)
Microalb Creat Ratio: 0.3 mg/g (ref 0.0–30.0)

## 2016-04-30 LAB — PSA: PSA: 0.61 ng/mL (ref 0.10–4.00)

## 2016-04-30 LAB — CBC
HEMATOCRIT: 43.8 % (ref 39.0–52.0)
HEMOGLOBIN: 14.9 g/dL (ref 13.0–17.0)
MCHC: 34.1 g/dL (ref 30.0–36.0)
MCV: 92.7 fl (ref 78.0–100.0)
Platelets: 387 10*3/uL (ref 150.0–400.0)
RBC: 4.72 Mil/uL (ref 4.22–5.81)
RDW: 14.1 % (ref 11.5–15.5)
WBC: 11.3 10*3/uL — AB (ref 4.0–10.5)

## 2016-04-30 LAB — HEMOGLOBIN A1C: Hgb A1c MFr Bld: 5.8 % (ref 4.6–6.5)

## 2016-04-30 NOTE — Assessment & Plan Note (Addendum)
1) Anticipatory Guidance: Discussed importance of wearing a seatbelt while driving and not texting while driving; changing batteries in smoke detector at least once annually; wearing suntan lotion when outside; eating a balanced and moderate diet; getting physical activity at least 30 minutes per day.   2) Immunizations / Screenings / Labs:  All immunizations are up to date per recommendations. Due for a colon cancer screening with referral to gastroenterology placed. Obtain PSA for prostate cancer screening. Obtain Hepatitis C antibody for Hepatitis C screening. Obtain A1c and urine microalbumin for diabetes screening. Due for a dental exam encouraged to be completed independently. All other screenings are up to date per recommendations.Obtain CBC, CMET, and  Lipid profile.  Overall well exam with risk factors for cardiovascular disease including Type 2 diabetes and sedentary lifestyle. Diabetes appears adequately controlled with updated status with new A1c. Encouraged increasing physical activity as tolerated which may be challenging secondary to his COPD and breathing. Continue other healthy lifestyle behaviors and choices. Follow up with pulmonology for COPD. Follow up prevention exam in 1 year. Follow up office visit pending blood work.

## 2016-04-30 NOTE — Assessment & Plan Note (Signed)
COPD currently managed by pulmonology and appears deconditioning secondary to lack of physical activity. He experienced notible shortness of breath when moving from the exam table to the chair. Encouraged increasing physical activity as tolerated. Continue current medications with changes per pulmonology.

## 2016-04-30 NOTE — Progress Notes (Signed)
Pre visit review using our clinic review tool, if applicable. No additional management support is needed unless otherwise documented below in the visit note. 

## 2016-04-30 NOTE — Assessment & Plan Note (Signed)
Obtain A1c today. Previous diabetes controlled with A1c of 5.9 and not on medications. Encouraged continued control through lifestyle management of nutrition and physical activity. Foot exam completed today. Diabetic eye exam is up to date. Pneumonia is up to date. Continue to monitor.

## 2016-04-30 NOTE — Patient Instructions (Signed)
Thank you for choosing [facility].  Summary/Instructions:  Please continue to take your medications as prescribed.   MOVE, MOVE, MOVE!!!!!  Please stop by the lab on the basement level of the building for your blood work. Your results will be released to MyChart (or called to you) after review, usually within 72 hours after test completion. If any changes need to be made, you will be notified at that same time.  Health Maintenance, Male A healthy lifestyle and preventative care can promote health and wellness.  Maintain regular health, dental, and eye exams.  Eat a healthy diet. Foods like vegetables, fruits, whole grains, low-fat dairy products, and lean protein foods contain the nutrients you need and are low in calories. Decrease your intake of foods high in solid fats, added sugars, and salt. Get information about a proper diet from your health care provider, if necessary.  Regular physical exercise is one of the most important things you can do for your health. Most adults should get at least 150 minutes of moderate-intensity exercise (any activity that increases your heart rate and causes you to sweat) each week. In addition, most adults need muscle-strengthening exercises on 2 or more days a week.   Maintain a healthy weight. The body mass index (BMI) is a screening tool to identify possible weight problems. It provides an estimate of body fat based on height and weight. Your health care provider can find your BMI and can help you achieve or maintain a healthy weight. For males 20 years and older:  A BMI below 18.5 is considered underweight.  A BMI of 18.5 to 24.9 is normal.  A BMI of 25 to 29.9 is considered overweight.  A BMI of 30 and above is considered obese.  Maintain normal blood lipids and cholesterol by exercising and minimizing your intake of saturated fat. Eat a balanced diet with plenty of fruits and vegetables. Blood tests for lipids and cholesterol should  begin at age 62 and be repeated every 5 years. If your lipid or cholesterol levels are high, you are over age 62, or you are at high risk for heart disease, you may need your cholesterol levels checked more frequently.Ongoing high lipid and cholesterol levels should be treated with medicines if diet and exercise are not working.  If you smoke, find out from your health care provider how to quit. If you do not use tobacco, do not start.  Lung cancer screening is recommended for adults aged 55-80 years who are at high risk for developing lung cancer because of a history of smoking. A yearly low-dose CT scan of the lungs is recommended for people who have at least a 30-pack-year history of smoking and are current smokers or have quit within the past 15 years. A pack year of smoking is smoking an average of 1 pack of cigarettes a day for 1 year (for example, a 30-pack-year history of smoking could mean smoking 1 pack a day for 30 years or 2 packs a day for 15 years). Yearly screening should continue until the smoker has stopped smoking for at least 15 years. Yearly screening should be stopped for people who develop a health problem that would prevent them from having lung cancer treatment.  If you choose to drink alcohol, do not have more than 2 drinks per day. One drink is considered to be 12 oz (360 mL) of beer, 5 oz (150 mL) of wine, or 1.5 oz (45 mL) of liquor.  Avoid the use of  street drugs. Do not share needles with anyone. Ask for help if you need support or instructions about stopping the use of drugs.  High blood pressure causes heart disease and increases the risk of stroke. High blood pressure is more likely to develop in:  People who have blood pressure in the end of the normal range (100-139/85-89 mm Hg).  People who are overweight or obese.  People who are African American.  If you are 118-62 years of age, have your blood pressure checked every 3-5 years. If you are 62 years of age or  older, have your blood pressure checked every year. You should have your blood pressure measured twice--once when you are at a hospital or clinic, and once when you are not at a hospital or clinic. Record the average of the two measurements. To check your blood pressure when you are not at a hospital or clinic, you can use:  An automated blood pressure machine at a pharmacy.  A home blood pressure monitor.  If you are 9445-62 years old, ask your health care provider if you should take aspirin to prevent heart disease.  Diabetes screening involves taking a blood sample to check your fasting blood sugar level. This should be done once every 3 years after age 62 if you are at a normal weight and without risk factors for diabetes. Testing should be considered at a younger age or be carried out more frequently if you are overweight and have at least 1 risk factor for diabetes.  Colorectal cancer can be detected and often prevented. Most routine colorectal cancer screening begins at the age of 62 and continues through age 62. However, your health care provider may recommend screening at an earlier age if you have risk factors for colon cancer. On a yearly basis, your health care provider may provide home test kits to check for hidden blood in the stool. A small camera at the end of a tube may be used to directly examine the colon (sigmoidoscopy or colonoscopy) to detect the earliest forms of colorectal cancer. Talk to your health care provider about this at age 62 when routine screening begins. A direct exam of the colon should be repeated every 5-10 years through age 62, unless early forms of precancerous polyps or small growths are found.  People who are at an increased risk for hepatitis B should be screened for this virus. You are considered at high risk for hepatitis B if:  You were born in a country where hepatitis B occurs often. Talk with your health care provider about which countries are considered  high risk.  Your parents were born in a high-risk country and you have not received a shot to protect against hepatitis B (hepatitis B vaccine).  You have HIV or AIDS.  You use needles to inject street drugs.  You live with, or have sex with, someone who has hepatitis B.  You are a man who has sex with other men (MSM).  You get hemodialysis treatment.  You take certain medicines for conditions like cancer, organ transplantation, and autoimmune conditions.  Hepatitis C blood testing is recommended for all people born from [id number] through 1967 and any individual with known risk factors for hepatitis C.  Healthy men should no longer receive prostate-specific antigen (PSA) blood tests as part of routine cancer screening. Talk to your health care provider about prostate cancer screening.  Testicular cancer screening is not recommended for adolescents or adult males who have no symptoms. Screening  includes self-exam, a health care provider exam, and other screening tests. Consult with your health care provider about any symptoms you have or any concerns you have about testicular cancer.  Practice safe sex. Use condoms and avoid high-risk sexual practices to reduce the spread of sexually transmitted infections (STIs).  You should be screened for STIs, including gonorrhea and chlamydia if:  You are sexually active and are younger than 24 years.  You are older than 24 years, and your health care provider tells you that you are at risk for this type of infection.  Your sexual activity has changed since you were last screened, and you are at an increased risk for chlamydia or gonorrhea. Ask your health care provider if you are at risk.  If you are at risk of being infected with HIV, it is recommended that you take a prescription medicine daily to prevent HIV infection. This is called pre-exposure prophylaxis (PrEP). You are considered at risk if:  You are a man who has sex with other men  (MSM).  You are a heterosexual man who is sexually active with multiple partners.  You take drugs by injection.  You are sexually active with a partner who has HIV.  Talk with your health care provider about whether you are at high risk of being infected with HIV. If you choose to begin PrEP, you should first be tested for HIV. You should then be tested every 3 months for as long as you are taking PrEP.  Use sunscreen. Apply sunscreen liberally and repeatedly throughout the day. You should seek shade when your shadow is shorter than you. Protect yourself by wearing long sleeves, pants, a wide-brimmed hat, and sunglasses year round whenever you are outdoors.  Tell your health care provider of new moles or changes in moles, especially if there is a change in shape or color. Also, tell your health care provider if a mole is larger than the size of a pencil eraser.  A one-time screening for abdominal aortic aneurysm (AAA) and surgical repair of large AAAs by ultrasound is recommended for men aged 65-75 years who are current or former smokers.  Stay current with your vaccines (immunizations).   This information is not intended to replace advice given to you by your health care provider. Make sure you discuss any questions you have with your health care provider.   Document Released: 04/23/2008 Document Revised: 11/16/2014 Document Reviewed: 03/23/2011 Elsevier Interactive Patient Education [employer]

## 2016-04-30 NOTE — Assessment & Plan Note (Signed)
CAD appears stable with no exacerbations or chest pain. Continue to monitor through risk factor control.

## 2016-04-30 NOTE — Progress Notes (Signed)
Subjective:    Patient ID: Theodore [name], male    DOB: 1955/11/22, 60 y.o.   MRN: [id number]  Chief Complaint  Patient presents with  . CPE    fasting    HPI:  Theodore [name] is a 60 y.o. male who presents today for an annual wellness visit.   1) Health Maintenance -   Diet - Averaging about 2 meals per day consisting of eggs, burritos, chicken, occasional fruits and vegetables and processed foods primarily; 1-2 cups of caffeine daily   Exercise - No structured exercise  2) Preventative Exams / Immunizations:  Dental -- Due for exam  Vision -- Up to date   Health Maintenance  Topic Date Due  . Hepatitis C Screening  [birth year]  . COLONOSCOPY  04/21/2004  . FOOT EXAM  09/04/2015  . URINE MICROALBUMIN  09/04/2015  . HEMOGLOBIN A1C  09/25/2015  . INFLUENZA VACCINE  06/09/2016  . OPHTHALMOLOGY EXAM  10/09/2016  . PNEUMOCOCCAL POLYSACCHARIDE VACCINE (2) 02/04/2017  . TETANUS/TDAP  09/03/2024  . ZOSTAVAX  Addressed  . HIV Screening  Completed    Immunization History  Administered Date(s) Administered  . Influenza Split 08/10/2011, 07/27/2012  . Influenza,inj,Quad PF,36+ Mos 07/14/2013, 08/06/2014, 10/17/2015  . Pneumococcal Conjugate-13 08/06/2014  . Pneumococcal Polysaccharide-23 02/05/2012  . Tdap 09/03/2014    Allergies  Allergen Reactions  . Naproxen Sodium Itching     Outpatient Prescriptions Prior to Visit  Medication Sig Dispense Refill  . albuterol (PROVENTIL) (2.5 MG/3ML) 0.083% nebulizer solution Take 2.5 mg by nebulization every 6 (six) hours as needed for wheezing or shortness of breath.    . budesonide-formoterol (SYMBICORT) 160-4.5 MCG/ACT inhaler Take 2 puffs first thing in am and then another 2 puffs about 12 hours later. 1 Inhaler 1  . guaiFENesin (MUCINEX) 600 MG 12 hr tablet Take 600 mg by mouth every 12 (twelve) hours as needed (w/ flutter valve).     . OXYGEN-HELIUM IN Inhale 2 L into the lungs at bedtime.     . predniSONE  (DELTASONE) 10 MG tablet TAKE 1 TABLET BY MOUTH DAILY. MAY UP TO 2 TABLETS DAILY IF NEEDED 60 tablet 0  . rivaroxaban (XARELTO) 20 MG TABS tablet TAKE 1 TABLET BY MOUTH DAILY WITH SUPPER. (START  AFTER YOU FINISH WITH THE ) 30 tablet 1  . SPIRIVA HANDIHALER 18 MCG inhalation capsule INHALE CONTENTS OF 1 CAPSULE VIA HANDIHALER EVERY DAY 30 capsule 5  . triamterene-hydrochlorothiazide (MAXZIDE-25) 37.5-25 MG tablet TAKE 1 TABLET BY MOUTH DAILY 90 tablet 1  . VENTOLIN HFA 108 (90 Base) MCG/ACT inhaler INHALE 2 PUFFS INTO THE LUNGS EVERY 4 HOURS AS NEEDED FOR SHORTNESS OF BREATH OR WHEEZING 18 g 0  . ALPRAZolam (XANAX) 0.25 MG tablet Take 1 tablet (0.25 mg total) by mouth 2 (two) times daily as needed for anxiety. 20 tablet 0  . escitalopram (LEXAPRO) 10 MG tablet Take 1 tablet (10 mg total) by mouth daily. 30 tablet 1  . sodium chloride (OCEAN) 0.65 % SOLN nasal spray Place 2 sprays into the nose as needed for congestion.     No facility-administered medications prior to visit.     Past Medical History  Diagnosis Date  . COPD (chronic obstructive pulmonary disease) (HCC)   . Seasonal allergies   . Asthma   . Pulmonary embolism (HCC) 01/23/2014  . NSTEMI (non-ST elevated myocardial infarction) (HCC) 01/2014  . Chronic bronchitis (HCC)   . Pneumonia 06/2013; 01/2014  . GERD (gastroesophageal reflux disease)   .  Cluster headache     "last bout was summer 2014; had them q spring 1991-2001" (01/24/2014)  . Arthritis     "back" (01/24/2014)  . Anxiety   . On home oxygen therapy     "2L; 24/7" (01/24/2014)     Past Surgical History  Procedure Laterality Date  . Nasal septoplasty w/ turbinoplasty Left 1983  . Turbinate reduction Bilateral ~ 1983     Family History  Problem Relation Age of Onset  . Leukemia Father   . Heart disease Father   . Asthma Sister   . Asthma Father   . Emphysema Sister      Social History   Social History  . Marital Status: Divorced    Spouse Name:  N/A  . Number of Children: 0  . Years of Education: 14   Occupational History  . [employer]   Social History Main Topics  . Smoking status: Former Smoker -- 2.00 packs/day for 35 years    Types: Cigarettes    Quit date: 01/24/2012  . Smokeless tobacco: Never Used  . Alcohol Use: Yes     Comment: 01/24/2014 "holidays; family birthdays"  . Drug Use: No  . Sexual Activity: No   Other Topics Concern  . Not on file   Social History Narrative   [place] and moved around a lot. 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           Review of Systems  Constitutional: Denies fever, chills, fatigue, or significant weight gain/loss. HENT: Head: Denies headache or neck pain Ears: Denies changes in hearing, ringing in ears, earache, drainage Nose: Denies discharge, stuffiness, itching, nosebleed, sinus pain Throat: Denies sore throat, hoarseness, dry mouth, sores, thrush Eyes: Denies loss/changes in vision, pain, redness, blurry/double vision, flashing lights Cardiovascular: Denies chest pain/discomfort, tightness, palpitations, shortness of breath with activity, difficulty lying down, swelling, sudden awakening with shortness of breath Respiratory: Denies shortness of breath, cough, sputum production, wheezing Gastrointestinal: Denies dysphasia, heartburn, change in appetite, nausea, change in bowel habits, rectal bleeding, constipation, diarrhea, yellow skin or eyes Genitourinary: Denies frequency, urgency, burning/pain, blood in urine, incontinence, change in urinary strength. Musculoskeletal: Denies muscle/joint pain, stiffness, back pain, redness or swelling of joints, trauma Skin: Denies rashes, lumps, itching, dryness, color changes, or hair/nail changes Neurological: Denies dizziness, fainting, seizures, weakness, numbness, tingling, tremor Psychiatric - Denies nervousness, stress, depression or memory loss Endocrine: Denies heat or cold  intolerance, sweating, frequent urination, excessive thirst, changes in appetite Hematologic: Denies ease of bruising or bleeding     Objective:     BP 118/72 mmHg  Pulse 98  Temp(Src) 98.2 F (36.8 C) (Oral)  Resp 18  Ht 6\' 4"  (1.93 m)  Wt 217 lb (98.431 kg)  BMI 26.43 kg/m2  SpO2 96% Nursing note and vital signs reviewed.  Physical Exam  Constitutional: He is oriented to person, place, and time. He appears well-developed and well-nourished.  HENT:  Head: Normocephalic.  Right Ear: Hearing, tympanic membrane, external ear and ear canal normal.  Left Ear: Hearing, tympanic membrane, external ear and ear canal normal.  Nose: Nose normal.  Mouth/Throat: Uvula is midline, oropharynx is clear and moist and mucous membranes are normal.  Eyes: Conjunctivae and EOM are normal. Pupils are equal, round, and reactive to light.  Neck: Neck supple. No JVD present. No tracheal deviation present. No thyromegaly present.  Cardiovascular: Normal rate, regular rhythm, normal heart sounds and intact distal pulses.   Pulmonary/Chest: Effort  normal. He has wheezes.  Abdominal: Soft. Bowel sounds are normal. He exhibits no distension and no mass. There is no tenderness. There is no rebound and no guarding.  Musculoskeletal: Normal range of motion. He exhibits no edema or tenderness.  Lymphadenopathy:    He has no cervical adenopathy.  Neurological: He is alert and oriented to person, place, and time. He has normal reflexes. No cranial nerve deficit. He exhibits normal muscle tone. Coordination normal.  Skin: Skin is warm and dry.  Psychiatric: He has a normal mood and affect. His behavior is normal. Judgment and thought content normal.       Assessment & Plan:   Problem List Items Addressed This Visit      Cardiovascular and Mediastinum   CAD (coronary artery disease) (Chronic)    CAD appears stable with no exacerbations or chest pain. Continue to monitor through risk factor control.           Respiratory   COPD GOLD IV     COPD currently managed by pulmonology and appears deconditioning secondary to lack of physical activity. He experienced notible shortness of breath when moving from the exam table to the chair. Encouraged increasing physical activity as tolerated. Continue current medications with changes per pulmonology.        Endocrine   DM (diabetes mellitus) (HCC)    Obtain A1c today. Previous diabetes controlled with A1c of 5.9 and not on medications. Encouraged continued control through lifestyle management of nutrition and physical activity. Foot exam completed today. Diabetic eye exam is up to date. Pneumonia is up to date. Continue to monitor.       Relevant Orders   Hemoglobin A1c   Urine Microalbumin w/creat. ratio     Other   Routine general medical examination at a health care facility - Primary    1) Anticipatory Guidance: Discussed importance of wearing a seatbelt while driving and not texting while driving; changing batteries in smoke detector at least once annually; wearing suntan lotion when outside; eating a balanced and moderate diet; getting physical activity at least 30 minutes per day.   2) Immunizations / Screenings / Labs:  All immunizations are up to date per recommendations. Due for a colon cancer screening with referral to gastroenterology placed. Obtain PSA for prostate cancer screening. Obtain Hepatitis C antibody for Hepatitis C screening. Obtain A1c and urine microalbumin for diabetes screening. Due for a dental exam encouraged to be completed independently. All other screenings are up to date per recommendations.Obtain CBC, CMET, and  Lipid profile.  Overall well exam with risk factors for cardiovascular disease including Type 2 diabetes and sedentary lifestyle. Diabetes appears adequately controlled with updated status with new A1c. Encouraged increasing physical activity as tolerated which may be challenging secondary to his COPD and  breathing. Continue other healthy lifestyle behaviors and choices. Follow up with pulmonology for COPD. Follow up prevention exam in 1 year. Follow up office visit pending blood work.        Relevant Orders   CBC   Comprehensive metabolic panel   Lipid panel   PSA   Hemoglobin A1c    Other Visit Diagnoses    Colon cancer screening        Relevant Orders    Ambulatory referral to Gastroenterology    Other problems related to lifestyle        Relevant Orders    Hepatitis C antibody        I have discontinued Theodore [name]'s sodium chloride,  ALPRAZolam, and escitalopram. I am also having him maintain his guaiFENesin, OXYGEN-HELIUM IN, albuterol, SPIRIVA HANDIHALER, triamterene-hydrochlorothiazide, predniSONE, VENTOLIN HFA, budesonide-formoterol, and rivaroxaban.   Follow-up: Return in about 6 months (around 10/30/2016).   [name], Gregory, FNP

## 2016-05-01 LAB — HEPATITIS C ANTIBODY: HCV AB: NEGATIVE

## 2016-05-04 ENCOUNTER — Telehealth: Payer: Self-pay | Admitting: Family

## 2016-05-04 NOTE — Telephone Encounter (Signed)
Please inform patient that his blood work shows that his hepatitis C is negative and his A1c is stable at 5.8. His prostate function, liver function, electrolytes, kidney function, and white/red blood cells are all within the normal limits. Lastly his cholesterol is elevated with a LDL or bad cholesterol of [id number] and. His good cholesterol is excellent at 61. Therefore no further medication is needed at this time. Please continue taking medications as prescribed. We will follow-up in 6 months or sooner if needed.

## 2016-05-05 NOTE — Telephone Encounter (Signed)
Tried calling pt and number was busy. Will try back

## 2016-05-06 NOTE — Telephone Encounter (Signed)
Informed pt of below, he did not have any questions.

## 2016-05-13 ENCOUNTER — Other Ambulatory Visit: Payer: Self-pay | Admitting: Family

## 2016-05-15 ENCOUNTER — Other Ambulatory Visit: Payer: Self-pay | Admitting: Internal Medicine

## 2016-05-18 ENCOUNTER — Encounter: Payer: Self-pay | Admitting: Gastroenterology

## 2016-06-06 ENCOUNTER — Other Ambulatory Visit: Payer: Self-pay | Admitting: Family

## 2016-06-09 ENCOUNTER — Ambulatory Visit (INDEPENDENT_AMBULATORY_CARE_PROVIDER_SITE_OTHER)
Admission: RE | Admit: 2016-06-09 | Discharge: 2016-06-09 | Disposition: A | Discharge status: Home / Self Care | Payer: Medicare Other | Source: Ambulatory Visit | Attending: Pulmonary Disease | Admitting: Pulmonary Disease

## 2016-06-09 ENCOUNTER — Ambulatory Visit (INDEPENDENT_AMBULATORY_CARE_PROVIDER_SITE_OTHER): Payer: Medicare Other | Admitting: Pulmonary Disease

## 2016-06-09 ENCOUNTER — Ambulatory Visit (HOSPITAL_COMMUNITY): Payer: Medicare Other | Attending: Cardiovascular Disease

## 2016-06-09 ENCOUNTER — Encounter: Payer: Self-pay | Admitting: Pulmonary Disease

## 2016-06-09 ENCOUNTER — Other Ambulatory Visit (HOSPITAL_COMMUNITY): Payer: Self-pay

## 2016-06-09 ENCOUNTER — Other Ambulatory Visit (INDEPENDENT_AMBULATORY_CARE_PROVIDER_SITE_OTHER): Payer: Medicare Other

## 2016-06-09 VITALS — BP 128/78 | HR 114 | Ht 76.0 in | Wt 216.0 lb

## 2016-06-09 DIAGNOSIS — I2510 Atherosclerotic heart disease of native coronary artery without angina pectoris: Secondary | ICD-10-CM

## 2016-06-09 DIAGNOSIS — I272 Other secondary pulmonary hypertension: Secondary | ICD-10-CM

## 2016-06-09 DIAGNOSIS — R0600 Dyspnea, unspecified: Secondary | ICD-10-CM

## 2016-06-09 DIAGNOSIS — J9611 Chronic respiratory failure with hypoxia: Secondary | ICD-10-CM

## 2016-06-09 DIAGNOSIS — J449 Chronic obstructive pulmonary disease, unspecified: Secondary | ICD-10-CM | POA: Diagnosis not present

## 2016-06-09 DIAGNOSIS — R05 Cough: Secondary | ICD-10-CM | POA: Diagnosis not present

## 2016-06-09 DIAGNOSIS — I071 Rheumatic tricuspid insufficiency: Secondary | ICD-10-CM | POA: Insufficient documentation

## 2016-06-09 DIAGNOSIS — I2782 Chronic pulmonary embolism: Secondary | ICD-10-CM

## 2016-06-09 LAB — CBC WITH DIFFERENTIAL/PLATELET
BASOS ABS: 0 10*3/uL (ref 0.0–0.1)
Basophils Relative: 0.4 % (ref 0.0–3.0)
Eosinophils Absolute: 0.5 10*3/uL (ref 0.0–0.7)
Eosinophils Relative: 3.6 % (ref 0.0–5.0)
HEMATOCRIT: 43.3 % (ref 39.0–52.0)
HEMOGLOBIN: 14.8 g/dL (ref 13.0–17.0)
LYMPHS PCT: 14.8 % (ref 12.0–46.0)
Lymphs Abs: 1.9 10*3/uL (ref 0.7–4.0)
MCHC: 34.3 g/dL (ref 30.0–36.0)
MCV: 91.5 fl (ref 78.0–100.0)
MONOS PCT: 5.7 % (ref 3.0–12.0)
Monocytes Absolute: 0.7 10*3/uL (ref 0.1–1.0)
Neutro Abs: 9.7 10*3/uL — ABNORMAL HIGH (ref 1.4–7.7)
Neutrophils Relative %: 75.5 % (ref 43.0–77.0)
Platelets: 386 10*3/uL (ref 150.0–400.0)
RBC: 4.73 Mil/uL (ref 4.22–5.81)
RDW: 13.9 % (ref 11.5–15.5)
WBC: 12.8 10*3/uL — ABNORMAL HIGH (ref 4.0–10.5)

## 2016-06-09 LAB — ECHOCARDIOGRAM COMPLETE
Height: 76 in
Weight: 3456 oz

## 2016-06-09 NOTE — Assessment & Plan Note (Signed)
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 I wonder whether or not he is still smoking cigarettes. He professes to be compliant with his medications. Lung function testing in 2013 showed very severe disease.  I also wonder whether or not there is an allergy to something like mold or dust which may be driving his recurrent exacerbations.  Plan: Continue Symbicort and Spiriva CBC with differential and serum IgE Chest x-ray as there has not been one in several years and his dyspnea has progressed Continue as needed albuterol

## 2016-06-09 NOTE — Assessment & Plan Note (Signed)
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 It sounds as if this was provoked by a hospitalization a few weeks prior.  Plan: Repeat echocardiogram I will discuss the indication for chronic anticoagulation with Dr. Sherene Sires May consider stopping anticoagulation at this is truly a provoked pulmonary embolism and his repeat echocardiogram is normal.

## 2016-06-09 NOTE — Progress Notes (Signed)
Subjective:    Patient ID: Theodore [name], male    DOB: 06/25/1956, 60 y.o.   MRN: [id number]  Synopsis: Has stage IV COPD (steroid dependent) and history of a pulmonary embolism after a hospitalization in 2015 formerly followed by Dr. Sherene Sires. Dr. Sherene Sires summarized his situation as follows: - PFT's 12/02/2011  FEV1  0.94 (24%) ratio 39 -  18% response to B 2 and DLCO 60%  -Alpha 1 genotype sent 03/01/12 >  MM  -07/14/2013 Med calendar , 10/24/2013  - self titration of steroids with ceiling of pred 20/ floor of zero rec 11/07/13 > did not follow action plan 12/11/2013 > repeated same 01/19/14  - 08/06/2014 p extensive coaching HFA effectiveness =    90%  - Smoked 3 packs a day at the most, decreased to 1 ppd for a "few years", he quit altogether since 2015  HPI Chief Complaint  Patient presents with  . Follow-up    switching from MW to BQ.  Pt c/o sob with exertion, chest tightness. CAT score 28.   Theodore [name] has COPD and has followed here for a few years with Dr. Sherene Sires.  He says that this all started after he was hospitalized for COPD in 2013 twice and was in the ICU.  He started seeing [place] after that.  Prior to that he had been told that he had "exercise asthma" which he grew out of (he thinks).  In 1994 he had a bad case of the "[place] flu" and was told that he had emphysema.  Around that time he started on Spiriva and Foradil.  He says he really hasn't been the same since then.    In the last 6 months he says that he has still had some problems from his COPD particularly on hot days.  He will wake up with chest tightness and has worse dyspnea.  However there are days when he feels OK.  He says that he is always aware of his COPD and symptoms.  He has been using the ventolin frequently, some days several times a day and he almost always uses it in the mornings.  Some mornings are not a problem, others he can't walk 10 feet without getting dyspnea.   He has associated leg swelling.    He had an NSTEMI  in the event of one of his episodes of dyspnea/COPD exacerbation years ago.  After that event he had a stress test which was reassuring aside from evidence of an "old scar".  He doesn't really have chest pain often, just primarily chest tightness.  He says when he has chest pain it is associated with certain movements of his arm.    He uses oxygen when he sleeps, but he doesn't use it when he walks.  He has used it on an as needed basis for dyspnea but not when walking.    He takes his Spiriva and Symbicort routinely.  He continues to take prednisone 10mg  daily and has been unable to come off of this altogether.   Past Medical History:  Diagnosis Date  . Anxiety   . Arthritis    "back" (01/24/2014)  . Asthma   . Chronic bronchitis (HCC)   . Cluster headache    "last bout was summer 2014; had them q spring 1991-2001" (01/24/2014)  . COPD (chronic obstructive pulmonary disease) (HCC)   . GERD (gastroesophageal reflux disease)   . NSTEMI (non-ST elevated myocardial infarction) (HCC) 01/2014  . On home oxygen therapy    "  2L; 24/7" (01/24/2014)  . Pneumonia 06/2013; 01/2014  . Pulmonary embolism (HCC) 01/23/2014  . Seasonal allergies       Review of Systems  Constitutional: Positive for fatigue. Negative for chills and fever.  HENT: Negative for postnasal drip, rhinorrhea and sinus pressure.   Respiratory: Positive for chest tightness, shortness of breath and wheezing.   Cardiovascular: Positive for leg swelling. Negative for chest pain and palpitations.       Objective:   Physical Exam  Vitals:   06/09/16 1005  BP: 128/78  Pulse: (!) 114  SpO2: 96%  Weight: 216 lb (98 kg)  Height: 6\' 4"  (1.93 m)   RA  Gen: well appearing, no acute distress HENT: NCAT, OP clear, neck supple without masses Eyes: PERRL, EOMi Lymph: no cervical lymphadenopathy PULM: Wheezing RLL, otherwise clear CV: RRR, no mgr, no JVD GI: BS+, soft, nontender, no hsm Derm: punctate rash over face, clubbing  noted MSK: normal bulk and tone Neuro: A&Ox4, CN II-XII intact, strength 5/5 in all 4 extremities Psyche: normal mood and affect  Records from my partner reviewed were he was cared for for pulmonary embolism and COPD after 2015 hospitalization  CBC records reviewed showing normal results but no recent differential performed on his white blood cell analysis      Assessment & Plan:  COPD GOLD IV  Bayne has very severe disease marked by prednisone dependence and recurrent exacerbations. I wonder whether or not he is still smoking cigarettes. He professes to be compliant with his medications. Lung function testing in 2013 showed very severe disease.  I also wonder whether or not there is an allergy to something like mold or dust which may be driving his recurrent exacerbations.  Plan: Continue Symbicort and Spiriva CBC with differential and serum IgE Chest x-ray as there has not been one in several years and his dyspnea has progressed Continue as needed albuterol  Chronic respiratory failure assoc with cor pulmonale We will walk today in clinic to see his O2 saturation drops on ambulation. However, based on his 2016 results there is no indication to treat at this time.  Pulmonary embolism (HCC) He had a bilateral pulmonary embolism associated with cor pulmonale in 2015. It sounds as if this was provoked by a hospitalization a few weeks prior.  Plan: Repeat echocardiogram I will discuss the indication for chronic anticoagulation with Dr. Sherene Sires May consider stopping anticoagulation at this is truly a provoked pulmonary embolism and his repeat echocardiogram is normal.  > 50% of this 48 minute visit spent face to face   Current Outpatient Prescriptions:  .  albuterol (PROVENTIL) (2.5 MG/3ML) 0.083% nebulizer solution, Take 2.5 mg by nebulization every 6 (six) hours as needed for wheezing or shortness of breath., Disp: , Rfl:  .  budesonide-formoterol (SYMBICORT) 160-4.5 MCG/ACT  inhaler, Take 2 puffs first thing in am and then another 2 puffs about 12 hours later., Disp: 1 Inhaler, Rfl: 1 .  guaiFENesin (MUCINEX) 600 MG 12 hr tablet, Take 600 mg by mouth every 12 (twelve) hours as needed (w/ flutter valve). , Disp: , Rfl:  .  OXYGEN-HELIUM IN, Inhale 2 L into the lungs at bedtime. , Disp: , Rfl:  .  predniSONE (DELTASONE) 10 MG tablet, TAKE 1 TABLET BY MOUTH DAILY. MAY UP TO 2 TABLETS DAILY IF NEEDED, Disp: 60 tablet, Rfl: 0 .  rivaroxaban (XARELTO) 20 MG TABS tablet, TAKE 1 TABLET BY MOUTH DAILY WITH SUPPER. (START 20MG  AFTER YOU FINISH WITH THE 15MG ), Disp:  30 tablet, Rfl: 1 .  SPIRIVA HANDIHALER 18 MCG inhalation capsule, INHALE CONTENTS OF 1 CAPSULE VIA HANDIHALER EVERY DAY, Disp: 30 capsule, Rfl: 0 .  triamterene-hydrochlorothiazide (MAXZIDE-25) 37.5-25 MG tablet, TAKE 1 TABLET BY MOUTH DAILY, Disp: 90 tablet, Rfl: 1 .  VENTOLIN HFA 108 (90 Base) MCG/ACT inhaler, INHALE 2 PUFFS INTO THE LUNGS EVERY 4 HOURS AS NEEDED FOR SHORTNESS OF BREATH OR WHEEZING, Disp: 18 g, Rfl: 0

## 2016-06-09 NOTE — Patient Instructions (Signed)
We will call you with the results of the bloodwork Keep taking Symbicort and Spiriva as you are doing We will call you with the results of the echocardiogram We will see you back in 6 weeks or sooner if needed

## 2016-06-09 NOTE — Assessment & Plan Note (Signed)
We will walk today in clinic to see his O2 saturation drops on ambulation. However, based on his 2016 results there is no indication to treat at this time.

## 2016-06-10 LAB — IGE: IgE (Immunoglobulin E), Serum: 47 kU/L (ref <115)

## 2016-07-08 ENCOUNTER — Telehealth: Payer: Self-pay

## 2016-07-08 NOTE — Telephone Encounter (Signed)
Dr Myrtie Neitheranis,      Pt on O2 at night and Xarelto.  Does not qualify for LEC.  Supposed to come in today for PV.  Would you like an OV or may Clayborne Danaatti go ahead and schedule at hospital?                                                                            Thanks,                                                                               Alisa Stjames/PV

## 2016-07-08 NOTE — Telephone Encounter (Signed)
Appt made with Amy for 07/14/16, pt notified

## 2016-07-08 NOTE — Telephone Encounter (Signed)
Thanks for the note.  He needs to be seen by one of the PAs in clinic

## 2016-07-14 ENCOUNTER — Encounter: Payer: Self-pay | Admitting: Physician Assistant

## 2016-07-14 ENCOUNTER — Encounter (INDEPENDENT_AMBULATORY_CARE_PROVIDER_SITE_OTHER): Payer: Self-pay

## 2016-07-14 ENCOUNTER — Telehealth: Payer: Self-pay | Admitting: *Deleted

## 2016-07-14 ENCOUNTER — Ambulatory Visit (INDEPENDENT_AMBULATORY_CARE_PROVIDER_SITE_OTHER): Payer: Medicare Other | Admitting: Physician Assistant

## 2016-07-14 VITALS — BP 128/78 | HR 74 | Ht 76.0 in | Wt 222.1 lb

## 2016-07-14 DIAGNOSIS — Z1211 Encounter for screening for malignant neoplasm of colon: Secondary | ICD-10-CM | POA: Diagnosis not present

## 2016-07-14 DIAGNOSIS — Z86010 Personal history of colonic polyps: Secondary | ICD-10-CM

## 2016-07-14 MED ORDER — NA SULFATE-K SULFATE-MG SULF 17.5-3.13-1.6 GM/177ML PO SOLN: 1.0000 | Freq: Once | ORAL | 0 refills | Status: AC

## 2016-07-14 NOTE — Progress Notes (Signed)
Subjective:    Patient ID: Theodore [name], male    DOB: 1955/11/20, 60 y.o.   MRN: [id number]  HPI Lea is a pleasant 61 year old white male new to GI today, referred by Terri Piedra NP, to discuss colonoscopy for colon cancer screening. Patient states that he has had 1 prior colonoscopy done about 12 years ago in [place] perhaps by Dr. Meriel Pica  and did have some polyps removed. He also has family history of colon cancer in a first cousin diagnosed in their 26s. He has no current GI complaints, specifically no complaints of abdominal pain and changes in bowel habits melena or hematochezia. He denies any heartburn indigestion dysphagia or odynophagia. Patient was to be scheduled for direct colon with Dr. Loletha Carrow, but noted he was on Xarelto and office appointment made. Patient does have history of coronary artery disease he is status post non-ST MI in 2015 which occurred during a hospitalization for COPD exacerbation. This was then complicated by bilateral pulmonary emboli and he has been on Xarelto since. He does not have any coronary stents. He does have diagnosis of COPD Gold IV, and is steroid dependent. He does not use oxygen during the daytime but is on 2 L at night. Other diagnoses include diabetes mellitus, pulmonary hypertension with  EF of 55-60%.  Review of Systems Pertinent positive and negative review of systems were noted in the above HPI section.  All other review of systems was otherwise negative.  Outpatient Encounter Prescriptions as of 07/14/2016  Medication Sig  . albuterol (PROVENTIL) (2.5 MG/3ML) 0.083% nebulizer solution Take 2.5 mg by nebulization every 6 (six) hours as needed for wheezing or shortness of breath.  . budesonide-formoterol (SYMBICORT) 160-4.5 MCG/ACT inhaler Take 2 puffs first thing in am and then another 2 puffs about 12 hours later.  Marland Kitchen guaiFENesin (MUCINEX) 600 MG 12 hr tablet Take 600 mg by mouth every 12 (twelve) hours as needed (w/ flutter valve).     . OXYGEN-HELIUM IN Inhale 2 L into the lungs at bedtime.   . predniSONE (DELTASONE) 10 MG tablet TAKE 1 TABLET BY MOUTH DAILY. MAY UP TO 2 TABLETS DAILY IF NEEDED  . SPIRIVA HANDIHALER 18 MCG inhalation capsule INHALE CONTENTS OF 1 CAPSULE VIA HANDIHALER EVERY DAY  . VENTOLIN HFA 108 (90 Base) MCG/ACT inhaler INHALE 2 PUFFS INTO THE LUNGS EVERY 4 HOURS AS NEEDED FOR SHORTNESS OF BREATH OR WHEEZING  . Na Sulfate-K Sulfate-Mg Sulf 17.5-3.13-1.6 GM/180ML SOLN Take 1 kit by mouth once.  . [DISCONTINUED] rivaroxaban (XARELTO) 20 MG TABS tablet TAKE 1 TABLET BY MOUTH DAILY WITH SUPPER. (START 20MG AFTER YOU FINISH WITH THE 15MG)  . [DISCONTINUED] triamterene-hydrochlorothiazide (MAXZIDE-25) 37.5-25 MG tablet TAKE 1 TABLET BY MOUTH DAILY   No facility-administered encounter medications on file as of 07/14/2016.    Allergies  Allergen Reactions  . Naproxen Sodium Itching   Patient Active Problem List   Diagnosis Date Noted  . Anxiety state 10/30/2015  . Routine general medical examination at a health care facility 09/03/2014  . Back pain 09/03/2014  . Nausea without vomiting 08/22/2014  . HBP (high blood pressure) 01/29/2014  . Acute pulmonary embolism ([place]) 01/23/2014  . Pulmonary embolism ([place]) 01/23/2014  . CAD (coronary artery disease) 01/23/2014  . Acute respiratory failure ([place]) 01/23/2014  . HCAP (healthcare-associated pneumonia) 01/20/2014  . Chronic respiratory failure assoc with cor pulmonale 01/20/2014  . Acute cor pulmonale ([place]) 01/12/2014  . NSTEMI (non-ST elevated myocardial infarction) ([place]) 01/11/2014  . DM (  diabetes mellitus) ([place]) 01/10/2014  . Multiple lung nodules 10/24/2013  . PNA (pneumonia) 06/25/2013  . COPD exacerbation ([place]) 06/13/2013  . Smoking 03/03/2012  . COPD GOLD IV  09/28/2011   Social History   Social History  . Marital status: Divorced    Spouse name: N/A  . Number of children: 0  . Years of education: 14   Occupational History  . [employer]   Social History Main Topics  . Smoking status: Former Smoker    Packs/day: 2.00    Years: 35.00    Types: Cigarettes    Quit date: 01/24/2012  . Smokeless tobacco: Never Used  . Alcohol use Yes     Comment: 01/24/2014 "holidays; family birthdays"  . Drug use: No  . Sexual activity: No   Other Topics Concern  . Not on file   Social History Narrative   Born in [place], [place] and moved around a lot. Dad was an Chief Financial Officer and moved frequently.          Mr. Silveria family history includes Asthma in his father and sister; Colon cancer in his cousin; Emphysema in his sister; Heart disease in his father and sister; High blood pressure in his mother; Leukemia in his father.      Objective:    Vitals:   07/14/16 1420  BP: 128/78  Pulse: 74    Physical Exam  well-developed older white male in no acute distress, blood pressure 128/78 pulse 74, height 6 foot 2, weight 207, BMI 26.59, O2 sat was 95% on room air today. HEENT; nontraumatic normocephalic EOMI PERRLA sclera anicteric, Cardiovascular; regular rate and rhythm with S1-S2 no murmur or gallop, Pulmonary; decreased breath sounds bilaterally with scattered rhonchi, Abdomen; is nontender nondistended bowel sounds are active there is no palpable mass or hepatosplenomegaly, Rectal ;exam not done, Ext;no clubbing cyanosis or edema skin warm and dry, Neuropsych; mood and affect appropriate       Assessment & Plan:   #16 60 year old white male referred for colon cancer screening, prior colonoscopy greater than 10 years ago and patient with history of polyps type unclear. Currently asymptomatic #2 severe COPD-steroid dependent and on O2 at night #3 history of bilateral pulmonary emboli 2015-on Xarelto #4  pulmonary hypertension #5 coronary artery disease status post non-STEMI 2015 #6 adult-onset diabetes mellitus #7 chronic anticoagulation   Plan; Will schedule for colonoscopy with Dr. Loletha Carrow . Procedure  discussed in detail with the patient including risks and benefits and he is agreeable to proceed. Procedure will be scheduled at the hospital due to his pulmonary status and oxygen usage.  Pt is at increased risk for complications with anesthesia and this was discussed with patient We will hold Xarelto for 24 hours prior to his procedure, we will communicate with his pulmonologist Dr. Lake Bells to assure that holding Xarelto for 24 hours prior colonoscopy is acceptable for this patient. We will also obtain records from Dr. Lorie Apley  office regarding prior colonoscopy and history of polyps.   Leonila Speranza S Kearra Calkin PA-C 07/14/2016   Cc: [place], FNP

## 2016-07-14 NOTE — Progress Notes (Signed)
Thank you for sending this case to me. I have reviewed the entire note, and the outlined plan seems appropriate.  

## 2016-07-14 NOTE — Patient Instructions (Addendum)
You have been scheduled for a colonoscopy. Please follow written instructions given to you at your visit today.  Please pick up your prep supplies at the pharmacy , [facility].  If you use inhalers (even only as needed), please bring them with you on the day of your procedure. Your physician has requested that you go to www.startemmi.com and enter the access code given to you at your visit today. This web site gives a general overview about your procedure. However, you should still follow specific instructions given to you by our office regarding your preparation for the procedure.

## 2016-07-14 NOTE — Telephone Encounter (Signed)
07/14/2016   RE: Theodore GuestJames W [name] DOB: [birth year] MRN: [id number]   Dear Dr. Max Fickleouglas McQuaid,    We have scheduled the above patient for an endoscopic procedure. Our records show that he is on anticoagulation therapy.   Please advise as to how long the patient may come off his therapy of Xarelto prior to the procedure, which is scheduled for 10-20-2016.  Please fax back/ or route the completed form [facility] Id[facility]iss Quackenbush CMA at [phone].   Sincerely,    Amy Esterwood PA-C

## 2016-07-14 NOTE — Telephone Encounter (Signed)
Stop Xarelto 48 hours prior to the procedure.  Restart per endoscopist judgement post procedure.

## 2016-07-21 ENCOUNTER — Ambulatory Visit (INDEPENDENT_AMBULATORY_CARE_PROVIDER_SITE_OTHER): Payer: Medicare Other | Admitting: Pulmonary Disease

## 2016-07-21 ENCOUNTER — Other Ambulatory Visit: Payer: Medicare Other

## 2016-07-21 ENCOUNTER — Ambulatory Visit: Payer: Self-pay | Admitting: Pulmonary Disease

## 2016-07-21 ENCOUNTER — Encounter: Payer: Self-pay | Admitting: Pulmonary Disease

## 2016-07-21 VITALS — BP 144/72 | HR 102 | Ht 76.0 in | Wt 228.6 lb

## 2016-07-21 DIAGNOSIS — I2510 Atherosclerotic heart disease of native coronary artery without angina pectoris: Secondary | ICD-10-CM | POA: Diagnosis not present

## 2016-07-21 DIAGNOSIS — Z23 Encounter for immunization: Secondary | ICD-10-CM | POA: Diagnosis not present

## 2016-07-21 DIAGNOSIS — J449 Chronic obstructive pulmonary disease, unspecified: Secondary | ICD-10-CM | POA: Diagnosis not present

## 2016-07-21 DIAGNOSIS — I2782 Chronic pulmonary embolism: Secondary | ICD-10-CM | POA: Diagnosis not present

## 2016-07-21 MED ORDER — BUDESONIDE-FORMOTEROL FUMARATE 160-4.5 MCG/ACT IN AERO: 2.0000 | INHALATION_SPRAY | Freq: Two times a day (BID) | RESPIRATORY_TRACT | 0 refills | Status: DC

## 2016-07-21 MED ORDER — PREDNISONE 10 MG PO TABS: ORAL_TABLET | ORAL | 0 refills | Status: AC

## 2016-07-21 NOTE — Assessment & Plan Note (Signed)
He notes increasing symptoms since running out of Symbicort.  We need to try to get him off of chronic prednisone, this may prove to be impossible given the duration he's been on that drug.  He is interested in trying to come off of prednisone.  Plan: Resume Symbicort Stop smoking Review Symbicort by 1 mg daily per week, prescription written, will have him return while he is taking 5 mg weekly to see how things are going. If he is doing well at that visit then he can continue to come down by 1 mg weekly

## 2016-07-21 NOTE — Assessment & Plan Note (Signed)
He had a provoked pulmonary embolism in 2015. This occurred approximately one month after he was hospitalized for an acute infectious illness (septic shock).  I see no evidence of more than 1 clot and he confirms this.  Plan: Check d-dimer A d-dimer negative then stop Xarelto altogether

## 2016-07-21 NOTE — Progress Notes (Signed)
Subjective:    Patient ID: Theodore [name], male    DOB: [birth year], 60 y.o.   MRN: [id number]  Synopsis: Has stage IV COPD (steroid dependent) and history of a pulmonary embolism after a hospitalization in 2015 formerly followed by Dr. Sherene SiresWert. Dr. Sherene SiresWert summarized his situation as follows: - PFT's 12/02/2011  FEV1  0.94 (24%) ratio 39 -  18% response to B 2 and DLCO 60%  -Alpha 1 genotype sent 03/01/12 >  MM  -07/14/2013 Med calendar , 10/24/2013  - self titration of steroids with ceiling of pred 20/ floor of zero rec 11/07/13 > did not follow action plan 12/11/2013 > repeated same 01/19/14  - 08/06/2014 p extensive coaching HFA effectiveness =    90%  - Smoked 3 packs a day at the most, decreased to 1 ppd for a "few years", he quit altogether since 2015  HPI Chief Complaint  Patient presents with  . Follow-up    pt having difficulties getting medications d/t insurance switch.  Pt has been using duoneb q6h since running out of Advair.     Theodore [name] ran out of his Symbicort and his breathing has been worse and he is using his duoneb more frequently due to chest tightness and wheezing.  He says that he really needs the duoneb every 6 hours.  Just walking 10 steps will make him dyspneic.  He was much better when he was taking the Symbicort.  He denies sick contacts.   No fevers or chills lately.  Leg swelling back up because he stopped his diuretic.    Past Medical History:  Diagnosis Date  . Anxiety   . Arthritis    "back" (01/24/2014)  . Asthma   . Chronic bronchitis (HCC)   . Cluster headache    "last bout was summer 2014; had them q spring 1991-2001" (01/24/2014)  . COPD (chronic obstructive pulmonary disease) (HCC)   . GERD (gastroesophageal reflux disease)   . NSTEMI (non-ST elevated myocardial infarction) (HCC) 01/2014  . On home oxygen therapy    "2L; 24/7" (01/24/2014)  . Pneumonia 06/2013; 01/2014  . Pulmonary embolism (HCC) 01/23/2014  . Seasonal allergies       Review of  Systems  Constitutional: Positive for fatigue. Negative for chills and fever.  HENT: Negative for postnasal drip, rhinorrhea and sinus pressure.   Respiratory: Positive for chest tightness, shortness of breath and wheezing.   Cardiovascular: Positive for leg swelling. Negative for chest pain and palpitations.       Objective:   Physical Exam  Vitals:   07/21/16 1427  BP: (!) 144/72  Pulse: (!) 102  SpO2: 97%  Weight: 228 lb 9.6 oz (103.7 kg)  Height: 6\' 4"  (1.93 m)   RA  Gen: well appearing, no acute distress HENT: NCAT, OP clear, neck supple without masses Eyes: PERRL, EOMi Lymph: no cervical lymphadenopathy PULM: Wheezing RLL, otherwise clear CV: RRR, no mgr, no JVD GI: BS+, soft, nontender, no hsm Derm: punctate rash over face, clubbing noted MSK: normal bulk and tone Neuro: A&Ox4, CN II-XII intact, strength 5/5 in all 4 extremities Psyche: normal mood and affect  August 2017 serum IgE normal, serum eosinophils normal.     Assessment & Plan:  Chronic respiratory failure assoc with cor pulmonale Resolved  Pulmonary embolism (HCC) He had a provoked pulmonary embolism in 2015. This occurred approximately one month after he was hospitalized for an acute infectious illness (septic shock).  I see no evidence of more than 1  clot and he confirms this.  Plan: Check d-dimer A d-dimer negative then stop Xarelto altogether  COPD GOLD IV  He notes increasing symptoms since running out of Symbicort.  We need to try to get him off of chronic prednisone, this may prove to be impossible given the duration he's been on that drug.  He is interested in trying to come off of prednisone.  Plan: Resume Symbicort Stop smoking Review Symbicort by 1 mg daily per week, prescription written, will have him return while he is taking 5 mg weekly to see how things are going. If he is doing well at that visit then he can continue to come down by 1 mg weekly  Current Outpatient  Prescriptions:  .  albuterol (PROVENTIL) (2.5 MG/3ML) 0.083% nebulizer solution, Take 2.5 mg by nebulization every 6 (six) hours as needed for wheezing or shortness of breath., Disp: , Rfl:  .  guaiFENesin (MUCINEX) 600 MG 12 hr tablet, Take 600 mg by mouth every 12 (twelve) hours as needed (w/ flutter valve). , Disp: , Rfl:  .  OXYGEN-HELIUM IN, Inhale 2 L into the lungs at bedtime. , Disp: , Rfl:  .  predniSONE (DELTASONE) 10 MG tablet, Take 9mg  daily for a week, then 8mg  daily for a week, then 7mg  daily for a week, then 6mg  daily for a week, then 5mg  daily, Disp: 60 tablet, Rfl: 0 .  SPIRIVA HANDIHALER 18 MCG inhalation capsule, INHALE CONTENTS OF 1 CAPSULE VIA HANDIHALER EVERY DAY, Disp: 30 capsule, Rfl: 0 .  VENTOLIN HFA 108 (90 Base) MCG/ACT inhaler, INHALE 2 PUFFS INTO THE LUNGS EVERY 4 HOURS AS NEEDED FOR SHORTNESS OF BREATH OR WHEEZING, Disp: 18 g, Rfl: 2 .  budesonide-formoterol (SYMBICORT) 160-4.5 MCG/ACT inhaler, Take 2 puffs first thing in am and then another 2 puffs about 12 hours later. (Patient not taking: Reported on 07/21/2016), Disp: 1 Inhaler, Rfl: 1 .  budesonide-formoterol (SYMBICORT) 160-4.5 MCG/ACT inhaler, Inhale 2 puffs into the lungs 2 (two) times daily., Disp: 1 Inhaler, Rfl: 0 .  rivaroxaban (XARELTO) 20 MG TABS tablet, Take 20 mg by mouth daily with supper., Disp: , Rfl:  .  triamterene-hydrochlorothiazide (DYAZIDE) 37.5-25 MG capsule, Take 1 capsule by mouth daily., Disp: , Rfl:

## 2016-07-21 NOTE — Patient Instructions (Signed)
We will call you with the results of your bloodwork  Resume Symbicort  After one week of taking Symbicort, take prednisone 9 mg daily for a week, 8 mg daily for a week, 7 mg daily for a week, 6 mg daily for a week, then 5 mg daily. If he find that your symptoms worsen while decreasing the dose of prednisone can go back to 10 mg daily and follow-up with us.  We will see you back with our nurse practitioner in 6 weeks  Let us know right away if you have worsening leg pain or swelling.

## 2016-07-21 NOTE — Assessment & Plan Note (Signed)
Resolved

## 2016-07-22 ENCOUNTER — Encounter: Payer: Self-pay | Admitting: Gastroenterology

## 2016-07-22 ENCOUNTER — Telehealth: Payer: Self-pay | Admitting: Pulmonary Disease

## 2016-07-22 LAB — D-DIMER, QUANTITATIVE: D-Dimer, Quant: 0.32 mcg/mL FEU (ref <0.50)

## 2016-07-22 NOTE — Telephone Encounter (Signed)
Notes Recorded by Lupita Leashouglas B McQuaid, MD on 07/22/2016 at 2:55 PM EDT A, Please let them know that this was OK. OK to stop Xarelto as he and I discussed in the last visit. Thanks B  --- Spoke with pt, aware of lab results/recs.  Nothing further needed.

## 2016-07-24 NOTE — Telephone Encounter (Signed)
I called to advise the patient that Dr. Kendrick FriesMcQuaid said he can be off the Xarelto on 12-10, 11,12.  The patient told me that Dr. Kendrick FriesMcQuaid just took him off the Xarelto.  He thanked me for

## 2016-08-13 ENCOUNTER — Other Ambulatory Visit: Payer: Self-pay | Admitting: Internal Medicine

## 2016-08-13 ENCOUNTER — Other Ambulatory Visit: Payer: Self-pay | Admitting: Pulmonary Disease

## 2016-09-01 ENCOUNTER — Other Ambulatory Visit: Payer: Self-pay | Admitting: Family

## 2016-09-01 ENCOUNTER — Encounter: Payer: Self-pay | Admitting: Adult Health

## 2016-09-01 ENCOUNTER — Ambulatory Visit (INDEPENDENT_AMBULATORY_CARE_PROVIDER_SITE_OTHER): Payer: Medicare Other | Admitting: Adult Health

## 2016-09-01 DIAGNOSIS — I2510 Atherosclerotic heart disease of native coronary artery without angina pectoris: Secondary | ICD-10-CM | POA: Diagnosis not present

## 2016-09-01 DIAGNOSIS — I2699 Other pulmonary embolism without acute cor pulmonale: Secondary | ICD-10-CM | POA: Diagnosis not present

## 2016-09-01 DIAGNOSIS — J441 Chronic obstructive pulmonary disease with (acute) exacerbation: Secondary | ICD-10-CM | POA: Diagnosis not present

## 2016-09-01 MED ORDER — DOXYCYCLINE HYCLATE 100 MG PO TABS: 100.0000 mg | ORAL_TABLET | Freq: Two times a day (BID) | ORAL | 0 refills | Status: DC

## 2016-09-01 MED ORDER — PREDNISONE 10 MG PO TABS: ORAL_TABLET | ORAL | 2 refills | Status: DC

## 2016-09-01 MED ORDER — ALBUTEROL SULFATE HFA 108 (90 BASE) MCG/ACT IN AERS: INHALATION_SPRAY | RESPIRATORY_TRACT | 3 refills | Status: DC

## 2016-09-01 NOTE — Addendum Note (Signed)
Addended by: Abigail MiyamotoPHELPS, DENISE D on: 09/01/2016 02:27 PM   Modules accepted: Orders

## 2016-09-01 NOTE — Progress Notes (Signed)
Subjective:    Patient ID: Theodore [name], male    DOB: 1956/01/08, 60 y.o.   MRN: [id number]  HPI Synopsis: Has stage IV COPD (steroid dependent) and history of a provoked pulmonary embolism after a hospitalization in 2015 formerly followed by Dr. Sherene SiresWert. Dr. Sherene SiresWert summarized his situation as follows: - PFT's 12/02/2011  FEV1  0.94 (24%) ratio 39 -  18% response to B 2 and DLCO 60%  -Alpha 1 genotype sent 03/01/12 >  MM  -07/14/2013 Med calendar , 10/24/2013  - self titration of steroids with ceiling of pred 20/ floor of zero rec 11/07/13 > did not follow action plan 12/11/2013 > repeated same 01/19/14  - 08/06/2014 p extensive coaching HFA effectiveness =    90%  - Smoked 3 packs a day at the most, decreased to 1 ppd for a "few years", he quit altogether since 2015  09/01/2016 Follow up : COPD  Patient returns for a 6 week follow-up. He remains on Symbicort and Spiriva. Patient has been on chronic steroids since 2015. Last visit. Patient was recommended to slowly taper prednisone down to 5 mg. Unfortunately he misunderstood instructions and stopped prednisone totally 5 weeks ago. Has noticed over last 3 days of increased cough, congestion , thick white mucus, wheezing . Has been using more albuteol last few days.  Also tells me he has not been filling his Dyazide . He says it is affordable and is unclear why he did not fill this. We discussed importance of medication compliance.   Patient had a provoked PE in 2015 after a hospitalization for critical illness with septic shock. He has been on Xarelto since that event. D-dimer was done last visit. This was negative. Patient was recommended to stop his Xarelto. No hemoptysis , calf pain or chest pain.   Flu , PVX and Prevnar are utd.     Past Medical History:  Diagnosis Date  . Anxiety   . Arthritis    "back" (01/24/2014)  . Asthma   . Chronic bronchitis (HCC)   . Cluster headache    "last bout was summer 2014; had them q spring 1991-2001"  (01/24/2014)  . COPD (chronic obstructive pulmonary disease) (HCC)   . GERD (gastroesophageal reflux disease)   . NSTEMI (non-ST elevated myocardial infarction) (HCC) 01/2014  . On home oxygen therapy    "2L; 24/7" (01/24/2014)  . Pneumonia 06/2013; 01/2014  . Pulmonary embolism (HCC) 01/23/2014  . Seasonal allergies    Current Outpatient Prescriptions on File Prior to Visit  Medication Sig Dispense Refill  . albuterol (PROVENTIL) (2.5 MG/3ML) 0.083% nebulizer solution Take 2.5 mg by nebulization every 6 (six) hours as needed for wheezing or shortness of breath.    . budesonide-formoterol (SYMBICORT) 160-4.5 MCG/ACT inhaler Inhale 2 puffs into the lungs 2 (two) times daily. 1 Inhaler 0  . guaiFENesin (MUCINEX) 600 MG 12 hr tablet Take 600 mg by mouth every 12 (twelve) hours as needed (w/ flutter valve).     . OXYGEN-HELIUM IN Inhale 2 L into the lungs at bedtime.     Marland Kitchen. SPIRIVA HANDIHALER 18 MCG inhalation capsule INHALE CONTENTS OF 1 CAPSULE VIA HANDIHALER EVERY DAY 30 capsule 5  . VENTOLIN HFA 108 (90 Base) MCG/ACT inhaler INHALE 2 PUFFS INTO THE LUNGS EVERY 4 HOURS AS NEEDED FOR SHORTNESS OF BREATH OR WHEEZING 18 g 2  . rivaroxaban (XARELTO) 20 MG TABS tablet Take 20 mg by mouth daily with supper.    . triamterene-hydrochlorothiazide (DYAZIDE) 37.5-25 MG  capsule Take 1 capsule by mouth daily.     No current facility-administered medications on file prior to visit.      Review of Systems Constitutional:   No  weight loss, night sweats,  Fevers, chills,  +fatigue, or  lassitude.  HEENT:   No headaches,  Difficulty swallowing,  Tooth/dental problems, or  Sore throat,                No sneezing, itching, ear ache, nasal congestion, post nasal drip,   CV:  No chest pain,  Orthopnea, PND,   anasarca, dizziness, palpitations, syncope.   GI  No heartburn, indigestion, abdominal pain, nausea, vomiting, diarrhea, change in bowel habits, loss of appetite, bloody stools.   Resp:  .  No chest wall  deformity  Skin: no rash or lesions.  GU: no dysuria, change in color of urine, no urgency or frequency.  No flank pain, no hematuria   MS:  No joint pain or swelling.  No decreased range of motion.  No back pain.  Psych:  No change in mood or affect. No depression or anxiety.  No memory loss.         Objective:   Physical Exam  Vitals:   09/01/16 1355  BP: 138/70  Pulse: (!) 116  Temp: 98.2 F (36.8 C)  TempSrc: Oral  SpO2: 96%  Weight: 209 lb 3.2 oz (94.9 kg)  Height: 6\' 4"  (1.93 m)   GEN: A/Ox3; pleasant , NAD, elderly    HEENT:  [place]/AT,  EACs-clear, TMs-wnl, NOSE-clear, THROAT-clear, no lesions, no postnasal drip or exudate noted.   NECK:  Supple w/ fair ROM; no JVD; normal carotid impulses w/o bruits; no thyromegaly or nodules palpated; no lymphadenopathy.    RESP  Coarse rhonchi bilaterally , no accessory muscle use, no dullness to percussion  CARD:  RRR, no m/r/g  , tr -1  peripheral edema, pulses intact, no cyanosis or clubbing.  GI:   Soft & nt; nml bowel sounds; no organomegaly or masses detected.   Musco: Warm bil, no deformities or joint swelling noted.   Neuro: alert, no focal deficits noted.    Skin: Warm, no lesions or rashes  Idriss Quackenbush NP-C  [facility]  09/01/2016       Assessment & Plan:

## 2016-09-01 NOTE — Assessment & Plan Note (Signed)
Flare - on chronic steroids since 2015 -abruptly stopped Pt education on steroids  Will restart and slow taper to 5mg  and hold  Close follow up in 6 weeks   Plan  . Patient Instructions  Begin Doxycycline 100mg  Twice daily  For 1 week, take with food and wear sunscreen.  Mucinex DM Twice daily  As needed  Cough/congestion  Restart Prednisone 20mg  daily for 1 week , then 10mg  daily for 1 week and then 5mg  daily and hold at this dose.  Restart your Dyazide daily .  Follow up  In 6 weeks and As needed   Please contact office for sooner follow up if symptoms do not improve or worsen or seek emergency care

## 2016-09-01 NOTE — Assessment & Plan Note (Signed)
Provoked PE in 2015 (after critical illness w/ septic shock)  , tx w/ 2 yrs of Xarelto  D Dimer in 07/2016 neg, xarelto stopped.  Cont off Xarelto .

## 2016-09-01 NOTE — Patient Instructions (Signed)
Begin Doxycycline 100mg  Twice daily  For 1 week, take with food and wear sunscreen.  Mucinex DM Twice daily  As needed  Cough/congestion  Restart Prednisone 20mg  daily for 1 week , then 10mg  daily for 1 week and then 5mg  daily and hold at this dose.  Restart your Dyazide daily .  Follow up  In 6 weeks and As needed   Please contact office for sooner follow up if symptoms do not improve or worsen or seek emergency care

## 2016-09-02 NOTE — Progress Notes (Signed)
Reviewed, agree with this plan of carep

## 2016-10-12 ENCOUNTER — Encounter (HOSPITAL_COMMUNITY): Payer: Self-pay

## 2016-10-13 ENCOUNTER — Encounter: Payer: Self-pay | Admitting: Adult Health

## 2016-10-13 ENCOUNTER — Ambulatory Visit (INDEPENDENT_AMBULATORY_CARE_PROVIDER_SITE_OTHER): Payer: Medicare Other | Admitting: Adult Health

## 2016-10-13 DIAGNOSIS — J441 Chronic obstructive pulmonary disease with (acute) exacerbation: Secondary | ICD-10-CM

## 2016-10-13 DIAGNOSIS — I2510 Atherosclerotic heart disease of native coronary artery without angina pectoris: Secondary | ICD-10-CM

## 2016-10-13 NOTE — Progress Notes (Signed)
Subjective:    Patient ID: Theodore [name], male    DOB: [birth year], 60 y.o.   MRN: [id number]  HPI Synopsis: Has stage IV COPD (steroid dependent) and history of a provoked pulmonary embolism after a hospitalization in 2015 formerly followed by Dr. Sherene SiresWert. Dr. Sherene SiresWert summarized his situation as follows: - PFT's 12/02/2011  FEV1  0.94 (24%) ratio 39 -  18% response to B 2 and DLCO 60%  -Alpha 1 genotype sent 03/01/12 >  MM  -07/14/2013 Med calendar , 10/24/2013  - self titration of steroids with ceiling of pred 20/ floor of zero rec 11/07/13 > did not follow action plan 12/11/2013 > repeated same 01/19/14  - 08/06/2014 p extensive coaching HFA effectiveness =    90%  - Smoked 3 packs a day at the most, decreased to 1 ppd for a "few years", he quit altogether since 2015  10/13/2016 Follow up : COPD  Patient returns for a 6 week follow-up. He was seen last ov with COPD flare . He was given Doxycycline and Prednisone taper to hold at 5mg  daily .  He remains on Symbicort and Spiriva. Patient has been on chronic steroids since 2015. We discussed slowly tapering to off steroid however he declines trying at this time. He is feeling better with cough and dyspnea returning back to his baseline. He denies chest pain, orthopnea, or edema  He says he has good and bad days.  Flu , PVX and Prevnar are utd.     Past Medical History:  Diagnosis Date  . Anxiety   . Arthritis    "back" (01/24/2014)  . Asthma   . Chronic bronchitis (HCC)   . Cluster headache    "last bout was summer 2014; had them q spring 1991-2001" (01/24/2014)  . COPD (chronic obstructive pulmonary disease) (HCC)   . GERD (gastroesophageal reflux disease)   . NSTEMI (non-ST elevated myocardial infarction) (HCC) 01/2014  . On home oxygen therapy    "2L; 24/7" (01/24/2014)  . Pneumonia 06/2013; 01/2014  . Pulmonary embolism (HCC) 01/23/2014  . Seasonal allergies    Current Outpatient Prescriptions on File Prior to Visit  Medication Sig Dispense  Refill  . albuterol (PROVENTIL) (2.5 MG/3ML) 0.083% nebulizer solution Take 2.5 mg by nebulization every 6 (six) hours as needed for wheezing or shortness of breath.    Marland Kitchen. albuterol (VENTOLIN HFA) 108 (90 Base) MCG/ACT inhaler INHALE 2 PUFFS INTO THE LUNGS EVERY 4 HOURS AS NEEDED FOR SHORTNESS OF BREATH OR WHEEZING 18 g 3  . budesonide-formoterol (SYMBICORT) 160-4.5 MCG/ACT inhaler Inhale 2 puffs into the lungs 2 (two) times daily. 1 Inhaler 0  . guaiFENesin (MUCINEX) 600 MG 12 hr tablet Take 600 mg by mouth every 12 (twelve) hours as needed (w/ flutter valve).     . OXYGEN-HELIUM IN Inhale 2 L into the lungs at bedtime.     . predniSONE (DELTASONE) 10 MG tablet 2 tabs day for 1 week, 1 tab day for 1 week , then 1/2 daily 60 tablet 2  . SPIRIVA HANDIHALER 18 MCG inhalation capsule INHALE CONTENTS OF 1 CAPSULE VIA HANDIHALER EVERY DAY 30 capsule 5  . triamterene-hydrochlorothiazide (DYAZIDE) 37.5-25 MG capsule Take 1 capsule by mouth daily.    . rivaroxaban (XARELTO) 20 MG TABS tablet Take 20 mg by mouth daily with supper.     No current facility-administered medications on file prior to visit.      Review of Systems Constitutional:   No  weight loss, night sweats,  Fevers, chills,  +fatigue, or  lassitude.  HEENT:   No headaches,  Difficulty swallowing,  Tooth/dental problems, or  Sore throat,                No sneezing, itching, ear ache, nasal congestion, post nasal drip,   CV:  No chest pain,  Orthopnea, PND,   anasarca, dizziness, palpitations, syncope.   GI  No heartburn, indigestion, abdominal pain, nausea, vomiting, diarrhea, change in bowel habits, loss of appetite, bloody stools.   Resp:  .  No chest wall deformity  Skin: no rash or lesions.  GU: no dysuria, change in color of urine, no urgency or frequency.  No flank pain, no hematuria   MS:  No joint pain or swelling.  No decreased range of motion.  No back pain.  Psych:  No change in mood or affect. No depression or  anxiety.  No memory loss.         Objective:   Physical Exam  Vitals:   10/13/16 1408  BP: 130/78  Pulse: 100  Temp: 98 F (36.7 C)  TempSrc: Oral  SpO2: 96%  Weight: 214 lb 6.4 oz (97.3 kg)  Height: 6\' 4"  (1.93 m)   GEN: A/Ox3; pleasant , NAD, elderly    HEENT:  [place]/AT,  EACs-clear, TMs-wnl, NOSE-clear, THROAT-clear, no lesions, no postnasal drip or exudate noted.   NECK:  Supple w/ fair ROM; no JVD; normal carotid impulses w/o bruits; no thyromegaly or nodules palpated; no lymphadenopathy.    RESP  Coarse rhonchi bilaterally , no accessory muscle use, no dullness to percussion  CARD:  RRR, no m/r/g  , tr -1  peripheral edema, pulses intact, no cyanosis or clubbing.  GI:   Soft & nt; nml bowel sounds; no organomegaly or masses detected.   Musco: Warm bil, no deformities or joint swelling noted.   Neuro: alert, no focal deficits noted.    Skin: Warm, no lesions or rashes  Mellissa Conley NP-C  [facility]  10/13/2016       Assessment & Plan:

## 2016-10-13 NOTE — Assessment & Plan Note (Signed)
Recent flare now resolved  Hold on low dose steroids (chronic since 2015)   Plan  Patient Instructions  Continue on Symbicort and Spiriva .  Mucinex DM Twice daily  As needed  Cough/congestion  Continue on Prednisone 5mg  daily  Follow up  In 3 months with McQuaid and As needed   Please contact office for sooner follow up if symptoms do not improve or worsen or seek emergency care

## 2016-10-13 NOTE — Patient Instructions (Addendum)
Continue on Symbicort and Spiriva .  Mucinex DM Twice daily  As needed  Cough/congestion  Continue on Prednisone 5mg  daily  Follow up  In 3 months with McQuaid and As needed   Please contact office for sooner follow up if symptoms do not improve or worsen or seek emergency care

## 2016-10-14 NOTE — Progress Notes (Signed)
Reviewed, agree 

## 2016-10-20 ENCOUNTER — Ambulatory Visit (HOSPITAL_COMMUNITY): Payer: Medicare Other | Admitting: Certified Registered Nurse Anesthetist

## 2016-10-20 ENCOUNTER — Encounter (HOSPITAL_COMMUNITY)
Admission: RE | Disposition: A | Discharge status: Home / Self Care | Payer: Self-pay | Source: Ambulatory Visit | Attending: Gastroenterology

## 2016-10-20 ENCOUNTER — Encounter (HOSPITAL_COMMUNITY): Payer: Self-pay

## 2016-10-20 ENCOUNTER — Ambulatory Visit (HOSPITAL_COMMUNITY): Admit: 2016-10-20 | Payer: Medicare Other | Admitting: Gastroenterology

## 2016-10-20 ENCOUNTER — Other Ambulatory Visit: Payer: Self-pay

## 2016-10-20 ENCOUNTER — Ambulatory Visit (HOSPITAL_COMMUNITY)
Admission: RE | Admit: 2016-10-20 | Discharge: 2016-10-20 | Disposition: A | Discharge status: Home / Self Care | Payer: Medicare Other | Source: Ambulatory Visit | Attending: Gastroenterology | Admitting: Gastroenterology

## 2016-10-20 ENCOUNTER — Encounter (HOSPITAL_COMMUNITY): Payer: Self-pay | Admitting: *Deleted

## 2016-10-20 DIAGNOSIS — Z1212 Encounter for screening for malignant neoplasm of rectum: Secondary | ICD-10-CM | POA: Diagnosis not present

## 2016-10-20 DIAGNOSIS — F419 Anxiety disorder, unspecified: Secondary | ICD-10-CM | POA: Insufficient documentation

## 2016-10-20 DIAGNOSIS — I10 Essential (primary) hypertension: Secondary | ICD-10-CM | POA: Insufficient documentation

## 2016-10-20 DIAGNOSIS — I252 Old myocardial infarction: Secondary | ICD-10-CM | POA: Diagnosis not present

## 2016-10-20 DIAGNOSIS — K5730 Diverticulosis of large intestine without perforation or abscess without bleeding: Secondary | ICD-10-CM | POA: Insufficient documentation

## 2016-10-20 DIAGNOSIS — J449 Chronic obstructive pulmonary disease, unspecified: Secondary | ICD-10-CM | POA: Diagnosis not present

## 2016-10-20 DIAGNOSIS — Z9981 Dependence on supplemental oxygen: Secondary | ICD-10-CM | POA: Insufficient documentation

## 2016-10-20 DIAGNOSIS — Z1211 Encounter for screening for malignant neoplasm of colon: Secondary | ICD-10-CM | POA: Insufficient documentation

## 2016-10-20 DIAGNOSIS — I2510 Atherosclerotic heart disease of native coronary artery without angina pectoris: Secondary | ICD-10-CM | POA: Diagnosis not present

## 2016-10-20 DIAGNOSIS — M1990 Unspecified osteoarthritis, unspecified site: Secondary | ICD-10-CM | POA: Diagnosis not present

## 2016-10-20 DIAGNOSIS — Z87891 Personal history of nicotine dependence: Secondary | ICD-10-CM | POA: Diagnosis not present

## 2016-10-20 DIAGNOSIS — K219 Gastro-esophageal reflux disease without esophagitis: Secondary | ICD-10-CM | POA: Diagnosis not present

## 2016-10-20 DIAGNOSIS — M549 Dorsalgia, unspecified: Secondary | ICD-10-CM | POA: Diagnosis not present

## 2016-10-20 DIAGNOSIS — K640 First degree hemorrhoids: Secondary | ICD-10-CM | POA: Insufficient documentation

## 2016-10-20 DIAGNOSIS — Z79899 Other long term (current) drug therapy: Secondary | ICD-10-CM | POA: Insufficient documentation

## 2016-10-20 DIAGNOSIS — Z86010 Personal history of colonic polyps: Secondary | ICD-10-CM

## 2016-10-20 DIAGNOSIS — Z86711 Personal history of pulmonary embolism: Secondary | ICD-10-CM | POA: Insufficient documentation

## 2016-10-20 HISTORY — PX: COLONOSCOPY WITH PROPOFOL: SHX5780

## 2016-10-20 SURGERY — COLONOSCOPY WITH PROPOFOL
Anesthesia: Monitor Anesthesia Care

## 2016-10-20 MED ORDER — PROPOFOL 500 MG/50ML IV EMUL
INTRAVENOUS | Status: DC | PRN
  Administered 2016-10-20: 200 ug/kg/min via INTRAVENOUS

## 2016-10-20 MED ORDER — ONDANSETRON HCL 4 MG/2ML IJ SOLN
INTRAMUSCULAR | Status: DC | PRN
  Administered 2016-10-20: 4 mg via INTRAVENOUS

## 2016-10-20 MED ORDER — PROPOFOL 10 MG/ML IV BOLUS
INTRAVENOUS | Status: DC | PRN
  Administered 2016-10-20 (×2): 20 mg via INTRAVENOUS
  Administered 2016-10-20: 30 mg via INTRAVENOUS

## 2016-10-20 MED ORDER — ONDANSETRON HCL 4 MG/2ML IJ SOLN
INTRAMUSCULAR | Status: AC
  Filled 2016-10-20: qty 2

## 2016-10-20 MED ORDER — LACTATED RINGERS IV SOLN
INTRAVENOUS | Status: DC
  Administered 2016-10-20: 1000 mL via INTRAVENOUS

## 2016-10-20 MED ORDER — PROPOFOL 10 MG/ML IV BOLUS
INTRAVENOUS | Status: AC
  Filled 2016-10-20: qty 40

## 2016-10-20 MED ORDER — PROPOFOL 10 MG/ML IV BOLUS
INTRAVENOUS | Status: AC
  Filled 2016-10-20: qty 20

## 2016-10-20 SURGICAL SUPPLY — 21 items

## 2016-10-20 NOTE — Anesthesia Postprocedure Evaluation (Signed)
Anesthesia Post Note  Patient: Theodore BlalockJames W [name]  Procedure(s) Performed: Procedure(s) (LRB): COLONOSCOPY WITH PROPOFOL (N/A)  Patient location during evaluation: Endoscopy Anesthesia Type: MAC Level of consciousness: awake and alert Pain management: pain level controlled Vital Signs Assessment: post-procedure vital signs reviewed and stable Respiratory status: spontaneous breathing, nonlabored ventilation, respiratory function stable and patient connected to nasal cannula oxygen Cardiovascular status: stable and blood pressure returned to baseline Anesthetic complications: no    Last Vitals:  Vitals:   10/20/16 0754 10/20/16 0936  BP: (!) 153/90 101/65  Pulse: (!) 105 88  Resp: 20 (!) 21  Temp: 36.9 C 36.7 C    Last Pain:  Vitals:   10/20/16 0936  TempSrc: Oral                 Theodore [name], Lenni Reckner

## 2016-10-20 NOTE — Anesthesia Preprocedure Evaluation (Signed)
Anesthesia Evaluation  Patient identified by MRN, date of birth, ID band Patient awake    Reviewed: Allergy & Precautions, NPO status , Patient's Chart, lab work & pertinent test results  Airway Mallampati: II  TM Distance: >3 FB Neck ROM: Full    Dental no notable dental hx.    Pulmonary asthma , COPD,  COPD inhaler and oxygen dependent, former smoker, PE   Pulmonary exam normal breath sounds clear to auscultation       Cardiovascular hypertension, Pt. on medications + CAD and + Past MI  Normal cardiovascular exam Rhythm:Regular Rate:Normal     Neuro/Psych negative neurological ROS  negative psych ROS   GI/Hepatic negative GI ROS, Neg liver ROS,   Endo/Other  negative endocrine ROSdiabetes  Renal/GU negative Renal ROS  negative genitourinary   Musculoskeletal negative musculoskeletal ROS (+)   Abdominal   Peds negative pediatric ROS (+)  Hematology negative hematology ROS (+)   Anesthesia Other Findings   Reproductive/Obstetrics negative OB ROS                             Anesthesia Physical Anesthesia Plan  ASA: III  Anesthesia Plan: MAC   Post-op Pain Management:    Induction:   Airway Management Planned: Simple Face Mask  Additional Equipment:   Intra-op Plan:   Post-operative Plan:   Informed Consent: I have reviewed the patients History and Physical, chart, labs and discussed the procedure including the risks, benefits and alternatives for the proposed anesthesia with the patient or authorized representative who has indicated his/her understanding and acceptance.   Dental advisory given  Plan Discussed with: CRNA  Anesthesia Plan Comments:         Anesthesia Quick Evaluation

## 2016-10-20 NOTE — Discharge Instructions (Signed)
YOU HAD AN ENDOSCOPIC PROCEDURE TODAY: Refer to the procedure report and other information in the discharge instructions given to you for any specific questions about what was found during the examination. If this information does not answer your questions, please call [place] office at [phone] to clarify.  ° °YOU SHOULD EXPECT: Some feelings of bloating in the abdomen. Passage of more gas than usual. Walking can help get rid of the air that was put into your GI tract during the procedure and reduce the bloating. If you had a lower endoscopy (such as a colonoscopy or flexible sigmoidoscopy) you may notice spotting of blood in your stool or on the toilet paper. Some abdominal soreness may be present for a day or two, also. ° °DIET: Your first meal following the procedure should be a light meal and then it is ok to progress to your normal diet. A half-sandwich or bowl of soup is an example of a good first meal. Heavy or fried foods are harder to digest and may make you feel nauseous or bloated. Drink plenty of fluids but you should avoid alcoholic beverages for 24 hours. If you had a esophageal dilation, please see attached instructions for diet.   ° °ACTIVITY: Your care partner should take you home directly after the procedure. You should plan to take it easy, moving slowly for the rest of the day. You can resume normal activity the day after the procedure however YOU SHOULD NOT DRIVE, use power tools, machinery or perform tasks that involve climbing or major physical exertion for 24 hours (because of the sedation medicines used during the test).  ° °SYMPTOMS TO REPORT IMMEDIATELY: °A gastroenterologist can be reached at any hour. Please call [phone]  for any of the following symptoms:  °Following lower endoscopy (colonoscopy, flexible sigmoidoscopy) °Excessive amounts of blood in the stool  °Significant tenderness, worsening of abdominal pains  °Swelling of the abdomen that is new, acute  °Fever of 100° or  higher  °Following upper endoscopy (EGD, EUS, ERCP, esophageal dilation) °Vomiting of blood or coffee ground material  °New, significant abdominal pain  °New, significant chest pain or pain under the shoulder blades  °Painful or persistently difficult swallowing  °New shortness of breath  °Black, tarry-looking or red, bloody stools ° °FOLLOW UP:  °If any biopsies were taken you will be contacted by phone or by letter within the next 1-3 weeks. Call [phone]  if you have not heard about the biopsies in 3 weeks.  °Please also call with any specific questions about appointments or follow up tests. ° °

## 2016-10-20 NOTE — Interval H&P Note (Signed)
History and Physical Interval Note:  10/20/2016 8:23 AM  Theodore GuestJames W [name]  has presented today for surgery, with the diagnosis of Colon screening History of polyps  The various methods of treatment have been discussed with the patient and family. After consideration of risks, benefits and other options for treatment, the patient has consented to  Procedure(s): COLONOSCOPY WITH PROPOFOL (N/A) as a surgical intervention .  The patient's history has been reviewed, patient examined, no change in status, stable for surgery.  I have reviewed the patient's chart and labs.  Questions were answered to the patient's satisfaction.     [name]

## 2016-10-20 NOTE — H&P (Signed)
History:  This patient presents for endoscopic testing for screening colonoscopy.  Theodore GuestJames W [name] Referring physician: [name], Gregory, FNP  Past Medical History: Past Medical History:  Diagnosis Date  . Anxiety   . Arthritis    "back" (01/24/2014)  . Asthma   . Chronic bronchitis (HCC)   . Cluster headache    "last bout was summer 2014; had them q spring 1991-2001" (01/24/2014)  . COPD (chronic obstructive pulmonary disease) (HCC)   . GERD (gastroesophageal reflux disease)   . NSTEMI (non-ST elevated myocardial infarction) (HCC) 01/2014  . On home oxygen therapy    "2L; 24/7" (01/24/2014)  . Pneumonia 06/2013; 01/2014  . Pulmonary embolism (HCC) 01/23/2014  . Seasonal allergies      Past Surgical History: Past Surgical History:  Procedure Laterality Date  . NASAL SEPTOPLASTY W/ TURBINOPLASTY Left 1983  . TURBINATE REDUCTION Bilateral ~ 1983    Allergies: Allergies  Allergen Reactions  . Naproxen Sodium Itching    Outpatient Meds: Current Facility-Administered Medications  Medication Dose Route Frequency Provider Last Rate Last Dose  . lactated ringers infusion   Intravenous Continuous [name], MD 10 mL/hr at 10/20/16 0759 1,000 mL at 10/20/16 0759      ___________________________________________________________________ Objective   Exam:  BP (!) 153/90   Pulse (!) 105   Temp 98.5 F (36.9 C) (Oral)   Resp 20   Ht 6\' 4"  (1.93 m)   Wt 214 lb (97.1 kg)   SpO2 98%   BMI 26.05 kg/m    CV: RRR without murmur, S1/S2, no JVD, no peripheral edema  Resp:globally decreased breath sounds ,  normal RR and effort noted  GI: soft, no tenderness, with active bowel sounds. No guarding or palpable organomegaly noted.  Neuro: awake, alert and oriented x 3. Normal gross motor function and fluent speech   Assessment:  Colon cancer screening  Plan:  Colonoscopy. Increased risk procedure due to COPD and anticoagulation use   [name]

## 2016-10-20 NOTE — Op Note (Addendum)
[facility] Patient Name: Theodore FortisJames [name] Procedure Date: 10/20/2016 MRN: [id number] Attending MD: Starr LakeHenry L. Myrtie [name] , MD Date of Birth: Mar 21, 1956 CSN: [id number] Age: 60 Admit Type: Outpatient Procedure:                Colonoscopy Indications:              Screening for colorectal malignant neoplasm Providers:                Sherilyn CooterHenry L. Myrtie Neitheranis, MD, Dwain SarnaPatricia Ford, RN, Kandice RobinsonsGuillaume                            Awaka, Technician Referring MD:             Tama HeadingsGregory D. Calone, FNP Medicines:                Monitored Anesthesia Care Complications:            No immediate complications. Estimated Blood Loss:     Estimated blood loss: none. Procedure:                Pre-Anesthesia Assessment:                           - Prior to the procedure, a History and Physical                            was performed, and patient medications and                            allergies were reviewed. The patient's tolerance of                            previous anesthesia was also reviewed. The risks                            and benefits of the procedure and the sedation                            options and risks were discussed with the patient.                            All questions were answered, and informed consent                            was obtained. Prior Anticoagulants: The patient has                            taken no previous anticoagulant or antiplatelet                            agents. ASA Grade Assessment: III - A patient with                            severe systemic disease. After reviewing the risks  and benefits, the patient was deemed in                            satisfactory condition to undergo the procedure.                           After obtaining informed consent, the colonoscope                            was passed under direct vision. Throughout the                            procedure, the patient's blood pressure, pulse, and                      oxygen saturations were monitored continuously. The                            EC-[id number] ([id number]) scope was introduced through                            the anus and advanced to the the cecum, identified                            by appendiceal orifice and ileocecal valve. The                            colonoscopy was performed without difficulty. The                            patient tolerated the procedure well. The quality                            of the bowel preparation was good. The ileocecal                            valve, appendiceal orifice, and rectum were                            photographed. The bowel preparation used was                            SUPREP. The quality of the bowel preparation was                            evaluated using the BBPS [facility] Bowel Preparation                            Scale) with scores of: Right Colon = 2, Transverse                            Colon = 3 and Left Colon = 2. The total BBPS score  equals 7. Scope In: 9:15:39 AM Scope Out: 9:30:57 AM Scope Withdrawal Time: 0 hours 8 minutes 53 seconds  Total Procedure Duration: 0 hours 15 minutes 18 seconds  Findings:      The perianal and digital rectal examinations were normal.      Multiple medium-mouthed diverticula were found in the left colon.      Internal hemorrhoids were found during retroflexion. The hemorrhoids       were small and Grade I (internal hemorrhoids that do not prolapse).      The exam was otherwise without abnormality. Impression:               - Diverticulosis in the left colon.                           - Internal hemorrhoids.                           - The examination was otherwise normal.                           - No specimens collected. Moderate Sedation:      MAC sedation used Recommendation:           - Patient has a contact number available for                            emergencies. The signs and  symptoms of potential                            delayed complications were discussed with the                            patient. Return to normal activities tomorrow.                            Written discharge instructions were provided to the                            patient.                           - Resume previous diet.                           - Continue present medications.                           - Repeat colonoscopy in 10 years for screening                            purposes. Procedure Code(s):        --- Professional ---                           [phone], Colonoscopy, flexible; diagnostic, including                            collection of specimen(s) by brushing  or washing,                            when performed (separate procedure) Diagnosis Code(s):        --- Professional ---                           Z12.11, Encounter for screening for malignant                            neoplasm of colon                           K64.0, First degree hemorrhoids                           K57.30, Diverticulosis of large intestine without                            perforation or abscess without bleeding CPT copyright 2016 [employer] Association. All rights reserved. The codes documented in this report are preliminary and upon coder review may  be revised to meet current compliance requirements. Theodore L. Myrtie Neither, MD 10/20/2016 9:40:15 AM This report has been signed electronically. Number of Addenda: 0

## 2016-10-20 NOTE — Transfer of Care (Signed)
Immediate Anesthesia Transfer of Care Note  Patient: Theodore [name]  Procedure(s) Performed: Procedure(s): COLONOSCOPY WITH PROPOFOL (N/A)  Patient Location: PACU  Anesthesia Type:MAC  Level of Consciousness:  sedated, patient cooperative and responds to stimulation  Airway & Oxygen Therapy:Patient Spontanous Breathing and Patient connected to face mask oxgen  Post-op Assessment:  Report given to PACU RN and Post -op Vital signs reviewed and stable  Post vital signs:  Reviewed and stable  Last Vitals:  Vitals:   10/20/16 0754  BP: (!) 153/90  Pulse: (!) 105  Resp: 20  Temp: 36.9 C    Complications: No apparent anesthesia complications

## 2016-10-22 ENCOUNTER — Encounter (HOSPITAL_COMMUNITY): Payer: Self-pay | Admitting: Gastroenterology

## 2016-11-28 ENCOUNTER — Other Ambulatory Visit: Payer: Self-pay | Admitting: Family

## 2016-11-30 ENCOUNTER — Telehealth: Payer: Self-pay | Admitting: Pulmonary Disease

## 2016-11-30 MED ORDER — PREDNISONE 10 MG PO TABS: ORAL_TABLET | ORAL | 0 refills | Status: DC

## 2016-11-30 MED ORDER — LEVOFLOXACIN 500 MG PO TABS: 500.0000 mg | ORAL_TABLET | Freq: Every day | ORAL | 0 refills | Status: DC

## 2016-11-30 NOTE — Telephone Encounter (Signed)
Darl PikesSusan returning our call - she can be reached at [phone] -pr

## 2016-11-30 NOTE — Telephone Encounter (Signed)
Spoke with pt, who c/o increased sob while on 2L O2. Pt states tried walking about 15-20 feet on 2L O2 and pt became very sob. Pt states it took him 5-6 minutes to catch his breathe. Pt also c/o prod cough prod with clear mucus, wheezing & R sided back pain with exhaling X 2d Pt denies any fever, chills or sweats.  I have advised pt to go to the ED if he worsens in the meantime.   RB please advise. Thanks.

## 2016-11-30 NOTE — Telephone Encounter (Signed)
You have not prescribed this for pt in the past. Please advise

## 2016-11-30 NOTE — Telephone Encounter (Signed)
Needs blood pressure follow up and potassium check.

## 2016-11-30 NOTE — Telephone Encounter (Signed)
Spoke with the pt and notified of recs per RB  He verbalized understanding  I scheduled him with SG for 12/02/16  He is aware to go to ED sooner if symptoms persist/worsen  Rxs were sent

## 2016-11-30 NOTE — Telephone Encounter (Signed)
He is to be treated for an acute exacerbation and possible pneumonia. I'm concerned about the chest discomfort. I believe he needs to be evaluated either here or in urgent care / ED. Please ask to take meds as below, have him seen either here as acute OV tomorrow. If he worsens in any way then he needs to go to ED.   Prednisone: Take 40mg  daily for 3 days, then 30mg  daily for 3 days, then 20mg  daily for 3 days, then 10mg  daily for 3 days, then stop  levaquin 500mg  qd x 7 days.

## 2016-11-30 NOTE — Telephone Encounter (Signed)
LM x 1 

## 2016-12-02 ENCOUNTER — Encounter: Payer: Self-pay | Admitting: Acute Care

## 2016-12-02 ENCOUNTER — Other Ambulatory Visit (INDEPENDENT_AMBULATORY_CARE_PROVIDER_SITE_OTHER): Payer: Medicare Other

## 2016-12-02 ENCOUNTER — Ambulatory Visit (INDEPENDENT_AMBULATORY_CARE_PROVIDER_SITE_OTHER): Payer: Medicare Other | Admitting: Acute Care

## 2016-12-02 ENCOUNTER — Ambulatory Visit (INDEPENDENT_AMBULATORY_CARE_PROVIDER_SITE_OTHER)
Admission: RE | Admit: 2016-12-02 | Discharge: 2016-12-02 | Disposition: A | Discharge status: Home / Self Care | Payer: Medicare Other | Source: Ambulatory Visit | Attending: Acute Care | Admitting: Acute Care

## 2016-12-02 VITALS — BP 142/72 | HR 109 | Temp 98.0°F | Ht 76.0 in | Wt 211.8 lb

## 2016-12-02 DIAGNOSIS — R0602 Shortness of breath: Secondary | ICD-10-CM

## 2016-12-02 DIAGNOSIS — J449 Chronic obstructive pulmonary disease, unspecified: Secondary | ICD-10-CM

## 2016-12-02 LAB — CBC WITH DIFFERENTIAL/PLATELET
BASOS ABS: 0 10*3/uL (ref 0.0–0.1)
Basophils Relative: 0.2 % (ref 0.0–3.0)
Eosinophils Absolute: 0 10*3/uL (ref 0.0–0.7)
Eosinophils Relative: 0.1 % (ref 0.0–5.0)
HEMATOCRIT: 41.8 % (ref 39.0–52.0)
Hemoglobin: 14.6 g/dL (ref 13.0–17.0)
LYMPHS PCT: 3.7 % — AB (ref 12.0–46.0)
Lymphs Abs: 0.5 10*3/uL — ABNORMAL LOW (ref 0.7–4.0)
MCHC: 34.8 g/dL (ref 30.0–36.0)
MCV: 90.1 fl (ref 78.0–100.0)
MONOS PCT: 3.3 % (ref 3.0–12.0)
Monocytes Absolute: 0.4 10*3/uL (ref 0.1–1.0)
NEUTROS ABS: 11.7 10*3/uL — AB (ref 1.4–7.7)
Neutrophils Relative %: 92.7 % — ABNORMAL HIGH (ref 43.0–77.0)
Platelets: 326 10*3/uL (ref 150.0–400.0)
RBC: 4.64 Mil/uL (ref 4.22–5.81)
RDW: 14.9 % (ref 11.5–15.5)
WBC: 12.6 10*3/uL — ABNORMAL HIGH (ref 4.0–10.5)

## 2016-12-02 LAB — BASIC METABOLIC PANEL
BUN: 15 mg/dL (ref 6–23)
CHLORIDE: 92 meq/L — AB (ref 96–112)
CO2: 29 meq/L (ref 19–32)
CREATININE: 0.9 mg/dL (ref 0.40–1.50)
Calcium: 9.8 mg/dL (ref 8.4–10.5)
GFR: 90.7 mL/min (ref >60.00)
GLUCOSE: 123 mg/dL — AB (ref 70–99)
Potassium: 4.8 mEq/L (ref 3.5–5.1)
Sodium: 131 mEq/L — ABNORMAL LOW (ref 135–145)

## 2016-12-02 NOTE — Progress Notes (Addendum)
History of Present Illness Theodore [name] is a 61 y.o. male withstage IV COPD (steroid dependent) and history of a provoked pulmonary embolism after a hospitalization in 2015 formerly followed by Dr. Sherene [name], now followed by Dr. Kendrick [name]   Dr. Sherene [name] summarized his situation as follows: - PFT's 12/02/2011  FEV1  0.94 (24%) ratio 39 -  18% response to B 2 and DLCO 60%  -Alpha 1 genotype sent 03/01/12 >  MM  -07/14/2013 Med calendar , 10/24/2013  - self titration of steroids with ceiling of pred 20/ floor of zero rec 11/07/13 > did not follow action plan 12/11/2013 > repeated same 01/19/14  - 08/06/2014 p extensive coaching HFA effectiveness =    90%  - Smoked 3 packs a day at the most, decreased to 1 ppd for a "few years", he quit altogether since 2015 - Stopped Xaralto 07/2016  Synopsis: Has stage IV COPD (steroid dependent) and history of a pulmonary embolism after a hospitalization in 2015 formerly followed by Dr. Sherene [name]. Dr. Sherene [name] summarized his situation as follows: - PFT's 12/02/2011  FEV1  0.94 (24%) ratio 39 -  18% response to B 2 and DLCO 60%  -Alpha 1 genotype sent 03/01/12 >  MM  -07/14/2013 Med calendar , 10/24/2013  - self titration of steroids with ceiling of pred 20/ floor of zero rec 11/07/13 > did not follow action plan 12/11/2013 > repeated same 01/19/14  - 08/06/2014 p extensive coaching HFA effectiveness =    90%  - Smoked 3 packs a day at the most, decreased to 1 ppd for a "few years", he quit altogether since 2015   12/02/2016 Acute OV: Pt. Presents with history of increased shortness of breath with activity after  15-20 feet of ambulation.Marland KitchenHe sleeps with nocturnal oxygen, but has had to use it the last several days due to worsening dyspnea. He presents today in a wheel chair, which he has been using the last several days. .He states this has been going on since Saturday 11/28/2016 at 4 am when he awoke with a pain in his mid right back.He states this pain did go away, but the shortness of  breath has not improved. The pain was on inspiration. He states he is coughing no more than usual, and that the cough is productive for tan secretions. He is taking Mucinex. He called the office 11/30/16 and was prescribed Levaquin x 7 days  and Pred Taper, and scheduled for an office visit. Dr. Delton [name] had concerns of pneumonia with COPD exacerbation. He is compliant with Symbicort and Spiriva, and takes 10 mg prednisone daily x the last 3-4 years.He was taking [name] until Sept of 2017, when it was d/c'd due to the fact PE appeared to be provoked and d-dimer at the time was negative.He states that he does feel better since starting  the Levaquin and the increased prednisone. He states his back pain has resolved with Levaquin. He denies any leg pain or tenderness or swelling, no recent automobile travel. He denies fever, orthopnea, or hemoptysis.   Tests this visit  CXR 12/02/2016>> COPD without acute abnormality. Personally reviewed by me.  12/03/2015>> D-Dimer>> 1.56 ( Resulted 12/03/16)  CT Angio 12/03/2016 IMPRESSION: Positive for acute PE with CTevidence of right heart strain (RV/LV Ratio = 1) consistent with at least submassive (intermediate risk) PE. The presence of right heart strain has been associated with an increased risk of morbidity and mortality.  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  Stable right upper lobe nodule.  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  12/03/2016>> Pt. Was notified of the results by me while in the CT suite. He was told to go to the ED at [facility] for further work up, evaluation and treatment of this new finding. He verbalized understanding, and went to the ED. Please see triad hospitalists note.  12/03/2016 Echo: Left ventricle: The cavity size was normal. Systolic function was   normal. The estimated ejection fraction was in the range of 60%   to 65%. Wall motion was normal; there  were no regional wall   motion abnormalities. - Aortic root: The aortic root was mildly dilated. - Pulmonary arteries: Systolic pressure was mildly increased.  Pt. Was hemodynamically stable, echo was negative, so patient was discharged home on Xaralto with pulmonary follow up within 2 weeks. Appointment was made for 12/23/2016.   Past medical hx Past Medical History:  Diagnosis Date  . Anxiety   . Arthritis    "back" (01/24/2014)  . Asthma   . Chronic bronchitis (HCC)   . Cluster headache    "last bout was summer 2014; had them q spring 1991-2001" (01/24/2014)  . COPD (chronic obstructive pulmonary disease) (HCC)   . GERD (gastroesophageal reflux disease)   . NSTEMI (non-ST elevated myocardial infarction) (HCC) 01/2014  . On home oxygen therapy    "2L; 24/7" (01/24/2014)  . Pneumonia 06/2013; 01/2014  . Pulmonary embolism (HCC) 01/23/2014  . Seasonal allergies      Past surgical hx, Family hx, Social hx all reviewed.  Current Outpatient Prescriptions on File Prior to Visit  Medication Sig  . albuterol (PROVENTIL) (2.5 MG/3ML) 0.083% nebulizer solution Take 2.5 mg by nebulization every 6 (six) hours as needed for wheezing or shortness of breath.  Marland Kitchen albuterol (VENTOLIN HFA) 108 (90 Base) MCG/ACT inhaler INHALE 2 PUFFS INTO THE LUNGS EVERY 4 HOURS AS NEEDED FOR SHORTNESS OF BREATH OR WHEEZING  . budesonide-formoterol (SYMBICORT) 160-4.5 MCG/ACT inhaler Inhale 2 puffs into the lungs 2 (two) times daily.  Marland Kitchen guaiFENesin (MUCINEX) 600 MG 12 hr tablet Take 600 mg by mouth every 12 (twelve) hours as needed (w/ flutter valve).   Marland Kitchen levofloxacin (LEVAQUIN) 500 MG tablet Take 1 tablet (500 mg total) by mouth daily.  . Multiple Vitamin (MULTIVITAMIN WITH MINERALS) TABS tablet Take 1 tablet by mouth 3 (three) times a week.  . OXYGEN-HELIUM IN Inhale 2 L into the lungs at bedtime.   . predniSONE (DELTASONE) 10 MG tablet 4 x 3 days, 3 x 3 days, 2 x 3 days, 1 x 3 days then stop  . sodium chloride  (OCEAN) 0.65 % SOLN nasal spray Place 1 spray into both nostrils 4 (four) times daily as needed for congestion.  Marland Kitchen SPIRIVA HANDIHALER 18 MCG inhalation capsule INHALE CONTENTS OF 1 CAPSULE VIA HANDIHALER EVERY DAY  . triamterene-hydrochlorothiazide (MAXZIDE-25) 37.5-25 MG tablet TAKE 1 TABLET BY MOUTH DAILY   No current facility-administered medications on file prior to visit.      Allergies  Allergen Reactions  . Naproxen Sodium Itching    Review Of Systems:  Constitutional:   No  weight loss, night sweats,  ? Fevers, chills, +fatigue, or  lassitude.  HEENT:   No headaches,  Difficulty swallowing,  Tooth/dental problems, or  Sore throat,                No sneezing, itching, ear ache, nasal congestion, post nasal drip,  CV:  No chest pain,  Orthopnea, PND, swelling in lower extremities, anasarca, dizziness, palpitations, syncope.   GI  No heartburn, indigestion, abdominal pain, nausea, vomiting, diarrhea, change in bowel habits, loss of appetite, bloody stools.   Resp: + shortness of breath with exertion less at rest.  + excess mucus, + productive cough,  No non-productive cough,  No coughing up of blood.  + change in color of mucus.( tan)  No wheezing.  No chest wall deformity  Skin: no rash or lesions.  GU: no dysuria, change in color of urine, no urgency or frequency.  No flank pain, no hematuria   MS:  No joint pain or swelling.  No decreased range of motion.  Resolved  back pain.  Psych:  No change in mood or affect. No depression or anxiety.  No memory loss.   Vital Signs BP (!) 142/72 (BP Location: Right Arm, Cuff Size: Normal)   Pulse (!) 109   Temp 98 F (36.7 C) (Oral)   Ht 6\' 4"  (1.93 m)   Wt 211 lb 12.8 oz (96.1 kg)   SpO2 95%   BMI 25.78 kg/m    Physical Exam:  General- No distress,  A&Ox3, frail gentleman appears older that stated age, in wheel chair ENT: No sinus tenderness, TM clear, pale nasal mucosa, no oral exudate,no post nasal drip, no  LAN Cardiac: S1, S2, regular rate and rhythm, no murmur Chest: No wheeze/ rales/ dullness; no accessory muscle use, no nasal flaring, no sternal retractions Abd.: Soft Non-tender, flat Ext: No clubbing cyanosis, edema Neuro:  Deconditioned in wheel Chair Skin: No rashes, warm and dry Psych: anxious   Assessment/Plan  COPD GOLD IV  Worsening Dyspnea x several days Treatment initiated with Levaquin/ Pred taper 11/30/16  CXR 12/02/2016 without acute abnormality. Xaralto stopped 07/2016 ( Provoked PE after hospitalization 2015) Differential includes COPD exacerbation/ Pneumonia/ PE Plan: CXR today- Stat D-dimer today stat in the lab. BMET and CBC with Diff today. If this lab is positive we will schedule you for a CT to rule out Pulmonary Embolism. Continue Levaquin daily until gone. Continue prednisone taper until complete and then resume your 10 mg daily. Monitor your oxygen saturations, wear nocturnal oxygen with drop in saturations below 88% We will place an order for a portable saturation probe. Saturation goal is 88-92% Follow up appointment with Sarah or Dr. Kendrick FriesMcQuaid  in 3 weeks. Please contact office for sooner follow up if symptoms do not improve or worsen or seek emergency care       Bevelyn NgoSarah F Groce, NP 12/02/2016  7:39 PM

## 2016-12-02 NOTE — Patient Instructions (Addendum)
It is nice to meet you today. CXR today- Stat D-dimer today stat in the lab. BMET and CBC with Diff today. If this lab is positive we will schedule you for a CT to rule out Pulmonary Embolism. Continue Levaquin daily until gone. Continue prednisone taper until complete and then resume your 10 mg daily. We will place an order for a portable saturation probe. Saturation goal is 88-92% Follow up appointment with Krishang Reading or Dr. Kendrick FriesMcQuaid  in 3 weeks. Please contact office for sooner follow up if symptoms do not improve or worsen or seek emergency care

## 2016-12-02 NOTE — Assessment & Plan Note (Signed)
Worsening Dyspnea x several days Treatment initiated with Levaquin/ Pred taper 11/30/16  CXR 12/02/2016 without acute abnormality. Xaralto stopped 07/2016 ( Provoked PE after hospitalization 2015) Differential includes COPD exacerbation/ Pneumonia/ PE Plan: CXR today- Stat D-dimer today stat in the lab. BMET and CBC with Diff today. If this lab is positive we will schedule you for a CT to rule out Pulmonary Embolism. Continue Levaquin daily until gone. Continue prednisone taper until complete and then resume your 10 mg daily. Monitor your oxygen saturations, wear nocturnal oxygen with drop in saturations below 88% We will place an order for a portable saturation probe. Saturation goal is 88-92% Follow up appointment with Heitor Steinhoff or Dr. Kendrick FriesMcQuaid  in 3 weeks. Please contact office for sooner follow up if symptoms do not improve or worsen or seek emergency care

## 2016-12-03 ENCOUNTER — Encounter (HOSPITAL_COMMUNITY): Payer: Self-pay

## 2016-12-03 ENCOUNTER — Inpatient Hospital Stay (HOSPITAL_COMMUNITY)
Admission: EM | Admit: 2016-12-03 | Discharge: 2016-12-03 | DRG: 175 | Disposition: A | Discharge status: Home / Self Care | Payer: Medicare Other | Attending: Emergency Medicine | Admitting: Emergency Medicine

## 2016-12-03 ENCOUNTER — Other Ambulatory Visit: Payer: Self-pay | Admitting: Acute Care

## 2016-12-03 ENCOUNTER — Ambulatory Visit (INDEPENDENT_AMBULATORY_CARE_PROVIDER_SITE_OTHER)
Admission: RE | Admit: 2016-12-03 | Discharge: 2016-12-03 | Disposition: A | Discharge status: Home / Self Care | Payer: Medicare Other | Source: Ambulatory Visit | Attending: Acute Care | Admitting: Acute Care

## 2016-12-03 ENCOUNTER — Telehealth: Payer: Self-pay | Admitting: Acute Care

## 2016-12-03 ENCOUNTER — Observation Stay (HOSPITAL_COMMUNITY): Payer: Medicare Other

## 2016-12-03 DIAGNOSIS — K219 Gastro-esophageal reflux disease without esophagitis: Secondary | ICD-10-CM | POA: Diagnosis present

## 2016-12-03 DIAGNOSIS — Z79899 Other long term (current) drug therapy: Secondary | ICD-10-CM | POA: Diagnosis not present

## 2016-12-03 DIAGNOSIS — I10 Essential (primary) hypertension: Secondary | ICD-10-CM | POA: Diagnosis present

## 2016-12-03 DIAGNOSIS — F411 Generalized anxiety disorder: Secondary | ICD-10-CM | POA: Diagnosis present

## 2016-12-03 DIAGNOSIS — R0603 Acute respiratory distress: Secondary | ICD-10-CM | POA: Diagnosis not present

## 2016-12-03 DIAGNOSIS — J449 Chronic obstructive pulmonary disease, unspecified: Secondary | ICD-10-CM | POA: Diagnosis present

## 2016-12-03 DIAGNOSIS — E118 Type 2 diabetes mellitus with unspecified complications: Secondary | ICD-10-CM | POA: Insufficient documentation

## 2016-12-03 DIAGNOSIS — I252 Old myocardial infarction: Secondary | ICD-10-CM | POA: Diagnosis not present

## 2016-12-03 DIAGNOSIS — I2699 Other pulmonary embolism without acute cor pulmonale: Secondary | ICD-10-CM | POA: Diagnosis not present

## 2016-12-03 DIAGNOSIS — I2510 Atherosclerotic heart disease of native coronary artery without angina pectoris: Secondary | ICD-10-CM | POA: Diagnosis present

## 2016-12-03 DIAGNOSIS — Z87891 Personal history of nicotine dependence: Secondary | ICD-10-CM | POA: Diagnosis not present

## 2016-12-03 DIAGNOSIS — I2602 Saddle embolus of pulmonary artery with acute cor pulmonale: Secondary | ICD-10-CM

## 2016-12-03 DIAGNOSIS — R079 Chest pain, unspecified: Secondary | ICD-10-CM | POA: Diagnosis present

## 2016-12-03 DIAGNOSIS — Z886 Allergy status to analgesic agent status: Secondary | ICD-10-CM

## 2016-12-03 DIAGNOSIS — E119 Type 2 diabetes mellitus without complications: Secondary | ICD-10-CM | POA: Diagnosis present

## 2016-12-03 DIAGNOSIS — Z7951 Long term (current) use of inhaled steroids: Secondary | ICD-10-CM | POA: Diagnosis not present

## 2016-12-03 DIAGNOSIS — Z9981 Dependence on supplemental oxygen: Secondary | ICD-10-CM | POA: Diagnosis not present

## 2016-12-03 DIAGNOSIS — R0600 Dyspnea, unspecified: Secondary | ICD-10-CM | POA: Diagnosis not present

## 2016-12-03 DIAGNOSIS — I2609 Other pulmonary embolism with acute cor pulmonale: Secondary | ICD-10-CM | POA: Diagnosis present

## 2016-12-03 DIAGNOSIS — R0609 Other forms of dyspnea: Secondary | ICD-10-CM

## 2016-12-03 DIAGNOSIS — Z8249 Family history of ischemic heart disease and other diseases of the circulatory system: Secondary | ICD-10-CM | POA: Diagnosis not present

## 2016-12-03 DIAGNOSIS — Z86711 Personal history of pulmonary embolism: Secondary | ICD-10-CM

## 2016-12-03 LAB — PROTIME-INR
INR: 0.95
Prothrombin Time: 12.7 seconds (ref 11.4–15.2)

## 2016-12-03 LAB — CBC
HCT: 42.1 % (ref 39.0–52.0)
Hemoglobin: 14.2 g/dL (ref 13.0–17.0)
MCH: 31.3 pg (ref 26.0–34.0)
MCHC: 33.7 g/dL (ref 30.0–36.0)
MCV: 92.9 fL (ref 78.0–100.0)
Platelets: 260 10*3/uL (ref 150–400)
RBC: 4.53 MIL/uL (ref 4.22–5.81)
RDW: 14.6 % (ref 11.5–15.5)
WBC: 13.1 10*3/uL — ABNORMAL HIGH (ref 4.0–10.5)

## 2016-12-03 LAB — BASIC METABOLIC PANEL
Anion gap: 11 (ref 5–15)
BUN: 14 mg/dL (ref 6–20)
CO2: 27 mmol/L (ref 22–32)
Calcium: 9.3 mg/dL (ref 8.9–10.3)
Chloride: 97 mmol/L — ABNORMAL LOW (ref 101–111)
Creatinine, Ser: 0.97 mg/dL (ref 0.61–1.24)
GFR calc Af Amer: >60 mL/min (ref >60)
GFR calc non Af Amer: >60 mL/min (ref >60)
Glucose, Bld: 94 mg/dL (ref 65–99)
Potassium: 4 mmol/L (ref 3.5–5.1)
Sodium: 135 mmol/L (ref 135–145)

## 2016-12-03 LAB — ECHOCARDIOGRAM COMPLETE
Height: 76 in
WEIGHTICAEL: 3376 [oz_av]

## 2016-12-03 LAB — I-STAT TROPONIN, ED: Troponin i, poc: 0 ng/mL (ref 0.00–0.08)

## 2016-12-03 LAB — TROPONIN I: Troponin I: <0.03 ng/mL (ref <0.03)

## 2016-12-03 LAB — D-DIMER, QUANTITATIVE (NOT AT ARMC): D DIMER QUANT: 1.56 ug{FEU}/mL — AB (ref <0.50)

## 2016-12-03 MED ORDER — ONDANSETRON HCL 4 MG/2ML IJ SOLN: 4.0000 mg | Freq: Four times a day (QID) | INTRAMUSCULAR | Status: DC | PRN

## 2016-12-03 MED ORDER — LEVOFLOXACIN 500 MG PO TABS
500.0000 mg | ORAL_TABLET | Freq: Every day | ORAL | Status: DC
  Administered 2016-12-03: 500 mg via ORAL
  Filled 2016-12-03: qty 1

## 2016-12-03 MED ORDER — ALBUTEROL SULFATE (2.5 MG/3ML) 0.083% IN NEBU: 2.5000 mg | INHALATION_SOLUTION | Freq: Four times a day (QID) | RESPIRATORY_TRACT | Status: DC

## 2016-12-03 MED ORDER — MORPHINE SULFATE (PF) 4 MG/ML IV SOLN: 2.0000 mg | INTRAVENOUS | Status: DC | PRN

## 2016-12-03 MED ORDER — HEPARIN (PORCINE) IN NACL 100-0.45 UNIT/ML-% IJ SOLN
1500.0000 [IU]/h | INTRAMUSCULAR | Status: DC
  Administered 2016-12-03: 1500 [IU]/h via INTRAVENOUS
  Filled 2016-12-03: qty 250

## 2016-12-03 MED ORDER — TIOTROPIUM BROMIDE MONOHYDRATE 18 MCG IN CAPS: 18.0000 ug | ORAL_CAPSULE | Freq: Every day | RESPIRATORY_TRACT | Status: DC

## 2016-12-03 MED ORDER — ALBUTEROL SULFATE (2.5 MG/3ML) 0.083% IN NEBU: 2.5000 mg | INHALATION_SOLUTION | Freq: Four times a day (QID) | RESPIRATORY_TRACT | Status: DC | PRN

## 2016-12-03 MED ORDER — SODIUM CHLORIDE 0.9 % IV SOLN: INTRAVENOUS | Status: DC

## 2016-12-03 MED ORDER — GI COCKTAIL ~~LOC~~: 30.0000 mL | Freq: Four times a day (QID) | ORAL | Status: DC | PRN

## 2016-12-03 MED ORDER — IOPAMIDOL (ISOVUE-370) INJECTION 76%
80.0000 mL | Freq: Once | INTRAVENOUS | Status: AC | PRN
  Administered 2016-12-03: 80 mL via INTRAVENOUS

## 2016-12-03 MED ORDER — HEPARIN BOLUS VIA INFUSION
4000.0000 [IU] | Freq: Once | INTRAVENOUS | Status: AC
  Administered 2016-12-03: 4000 [IU] via INTRAVENOUS
  Filled 2016-12-03: qty 4000

## 2016-12-03 MED ORDER — GUAIFENESIN ER 600 MG PO TB12: 600.0000 mg | ORAL_TABLET | Freq: Two times a day (BID) | ORAL | Status: DC | PRN

## 2016-12-03 MED ORDER — ACETAMINOPHEN 325 MG PO TABS: 650.0000 mg | ORAL_TABLET | ORAL | Status: DC | PRN

## 2016-12-03 MED ORDER — SALINE SPRAY 0.65 % NA SOLN: 1.0000 | Freq: Four times a day (QID) | NASAL | Status: DC | PRN

## 2016-12-03 MED ORDER — RIVAROXABAN (XARELTO) VTE STARTER PACK (15 & 20 MG): 1.0000 | ORAL_TABLET | Freq: Two times a day (BID) | ORAL | 0 refills | Status: DC

## 2016-12-03 MED ORDER — MOMETASONE FURO-FORMOTEROL FUM 200-5 MCG/ACT IN AERO
2.0000 | INHALATION_SPRAY | Freq: Two times a day (BID) | RESPIRATORY_TRACT | Status: DC
Start: 2016-12-03 — End: 2016-12-03

## 2016-12-03 MED ORDER — ZOLPIDEM TARTRATE 5 MG PO TABS: 5.0000 mg | ORAL_TABLET | Freq: Every evening | ORAL | Status: DC | PRN

## 2016-12-03 NOTE — Telephone Encounter (Signed)
I Received a call report for a positive acute PE with CT evidence of Right heart strain ( RV/LV ratio = 1). I spoke with [name], who was still at the scanner and explained that he had a recurrent PE. I advised [facility], where he would be evaluated by the hospitalists. I told him to plan on admission. I have spoken with Dr. Isaiah SergeMannam, who is am [phone] pager for pulmonary consults. He will evaluate the patient for EKOS/ ICU admission vs. Admission per hospitalists service for treatment of PE. This is the second PE, therefore pt. Will require  life long anticoagulation. Pt. Verbalized understanding of the above, and was going directly [facility]. I also called the ED triage RN to make her aware the patient was on the way.

## 2016-12-03 NOTE — Progress Notes (Signed)
  Echocardiogram 2D Echocardiogram has been performed.  Leta JunglingCooper, Dalanie Kisner M 12/03/2016, 2:52 PM

## 2016-12-03 NOTE — ED Triage Notes (Signed)
[facility]. States that he developed shortness of breath on Saturday with thoracic back pain, hx of PE. No CP or distress on arival

## 2016-12-03 NOTE — ED Provider Notes (Signed)
[facility]-EMERGENCY DEPT Provider Note   CSN: [id number] Arrival date & time: 12/03/16  1137   By signing my name below, I, Freida Busman, attest that this documentation has been prepared under the direction and in the presence of Raeford Razor, MD . Electronically Signed: Freida Busman, Scribe. 12/03/2016. 12:08 PM.  History   Chief Complaint Chief Complaint  Patient presents with  . confirmed PE    The history is provided by the patient. No language interpreter was used.     HPI Comments:  Theodore [name] is a 61 y.o. male with a history of PE, who presents to the Emergency Department complaining of gradual onset, persistent SOB x ~ 6 days. He reports associated back pain at time of onset but has resolved with Tylenol and DOE. He also notes chronic cough secondary to COPD but states he has has had dark rusty sputum since yesterday. He notes he was on xarelto previously for past PE which he was thought to have developed following a long hospital stay. He states he stopped taking Xarelto ~ 4-5 months ago. He denies recent long periods of immobilizations and hospitalizations. Pt uses 2L home O2 at night. He was sent to the ED from [facility] imaging for confirmed PE.   [facility] [place])- PCP [facility]- Pulmonologist   Past Medical History:  Diagnosis Date  . Anxiety   . Arthritis    "back" (01/24/2014)  . Asthma   . Chronic bronchitis (HCC)   . Cluster headache    "last bout was summer 2014; had them q spring 1991-2001" (01/24/2014)  . COPD (chronic obstructive pulmonary disease) (HCC)   . GERD (gastroesophageal reflux disease)   . NSTEMI (non-ST elevated myocardial infarction) (HCC) 01/2014  . On home oxygen therapy    "2L; 24/7" (01/24/2014)  . Pneumonia 06/2013; 01/2014  . Pulmonary embolism (HCC) 01/23/2014  . Seasonal allergies     Patient Active Problem List   Diagnosis Date Noted  . Anxiety state 10/30/2015  . Routine general medical examination at a health care facility  09/03/2014  . Back pain 09/03/2014  . Nausea without vomiting 08/22/2014  . HBP (high blood pressure) 01/29/2014  . Acute pulmonary embolism (HCC) 01/23/2014  . Pulmonary embolism (HCC) 01/23/2014  . CAD (coronary artery disease) 01/23/2014  . Acute respiratory failure (HCC) 01/23/2014  . HCAP (healthcare-associated pneumonia) 01/20/2014  . Chronic respiratory failure assoc with cor pulmonale 01/20/2014  . Acute cor pulmonale (HCC) 01/12/2014  . NSTEMI (non-ST elevated myocardial infarction) (HCC) 01/11/2014  . DM (diabetes mellitus) (HCC) 01/10/2014  . Multiple lung nodules 10/24/2013  . PNA (pneumonia) 06/25/2013  . COPD exacerbation (HCC) 06/13/2013  . Smoking 03/03/2012  . COPD GOLD IV  09/28/2011    Past Surgical History:  Procedure Laterality Date  . COLONOSCOPY WITH PROPOFOL N/A 10/20/2016   Procedure: COLONOSCOPY WITH PROPOFOL;  Surgeon: Sherrilyn Rist, MD;  Location: [facility];  Service: Gastroenterology;  Laterality: N/A;  . NASAL SEPTOPLASTY W/ TURBINOPLASTY Left 1983  . TURBINATE REDUCTION Bilateral ~ 1983       Home Medications    Prior to Admission medications   Medication Sig Start Date End Date Taking? Authorizing Provider  albuterol (PROVENTIL) (2.5 MG/3ML) 0.083% nebulizer solution Take 2.5 mg by nebulization every 6 (six) hours as needed for wheezing or shortness of breath.    Historical Provider, MD  albuterol (VENTOLIN HFA) 108 (90 Base) MCG/ACT inhaler INHALE 2 PUFFS INTO THE LUNGS EVERY 4 HOURS AS NEEDED FOR SHORTNESS OF BREATH  OR WHEEZING 09/01/16   Tammy S Parrett, NP  budesonide-formoterol (SYMBICORT) 160-4.5 MCG/ACT inhaler Inhale 2 puffs into the lungs 2 (two) times daily. 07/21/16   Lupita Leash, MD  guaiFENesin (MUCINEX) 600 MG 12 hr tablet Take 600 mg by mouth every 12 (twelve) hours as needed (w/ flutter valve).     Historical Provider, MD  levofloxacin (LEVAQUIN) 500 MG tablet Take 1 tablet (500 mg total) by mouth daily. 11/30/16    Leslye Peer, MD  Multiple Vitamin (MULTIVITAMIN WITH MINERALS) TABS tablet Take 1 tablet by mouth 3 (three) times a week.    Historical Provider, MD  OXYGEN-HELIUM IN Inhale 2 L into the lungs at bedtime.     Historical Provider, MD  predniSONE (DELTASONE) 10 MG tablet 4 x 3 days, 3 x 3 days, 2 x 3 days, 1 x 3 days then stop 11/30/16   Leslye Peer, MD  sodium chloride (OCEAN) 0.65 % SOLN nasal spray Place 1 spray into both nostrils 4 (four) times daily as needed for congestion.    Historical Provider, MD  SPIRIVA HANDIHALER 18 MCG inhalation capsule INHALE CONTENTS OF 1 CAPSULE VIA HANDIHALER EVERY DAY 08/13/16   Lupita Leash, MD  triamterene-hydrochlorothiazide [facility]) 37.5-25 MG tablet TAKE 1 TABLET BY MOUTH DAILY 11/30/16   [name], FNP    Family History Family History  Problem Relation Age of Onset  . Leukemia Father   . Heart disease Father   . Asthma Father   . Asthma Sister   . Emphysema Sister   . Heart disease Sister   . High blood pressure Mother   . Colon cancer Cousin     First cousin    Social History Social History  Substance Use Topics  . Smoking status: Former Smoker    Packs/day: 2.00    Years: 35.00    Types: Cigarettes    Quit date: 01/24/2012  . Smokeless tobacco: Never Used  . Alcohol use Yes     Comment: 01/24/2014 "holidays; family birthdays"     Allergies   Naproxen sodium   Review of Systems Review of Systems  Constitutional: Negative for chills and fever.  Respiratory: Positive for cough and shortness of breath.   Cardiovascular: Negative for chest pain.  Musculoskeletal: Positive for back pain (resolved).  All other systems reviewed and are negative.    Physical Exam Updated Vital Signs BP 116/94 (BP Location: Left Arm)   Pulse 88   Temp 98.3 F (36.8 C) (Oral)   Resp 20   Ht 6\' 4"  (1.93 m)   Wt 211 lb (95.7 kg)   SpO2 98%   BMI 25.68 kg/m   Physical Exam  Constitutional: He is oriented to person, place,  and time. He appears well-developed and well-nourished. No distress.  HENT:  Head: Normocephalic and atraumatic.  Eyes: EOM are normal.  Neck: Normal range of motion.  Cardiovascular: Normal rate, regular rhythm, normal heart sounds and intact distal pulses.   Pulmonary/Chest: Breath sounds normal. Tachypnea noted. No respiratory distress.  Short of breath  getting from wheelchair to bed   Abdominal: Soft. He exhibits no distension. There is no tenderness.  Musculoskeletal: Normal range of motion. He exhibits edema. He exhibits no tenderness.  Asymmetric BLE edema R>L  no calf tenderness   Neurological: He is alert and oriented to person, place, and time.  Skin: Skin is warm and dry.  Psychiatric: He has a normal mood and affect. Judgment normal.  Nursing note and  vitals reviewed.    ED Treatments / Results  DIAGNOSTIC STUDIES:  Oxygen Saturation is 97% on RA, normal by my interpretation.    COORDINATION OF CARE:  12:05 PM Discussed treatment plan with pt at bedside which includes hospital admission and pt agreed to plan.  Labs (all labs ordered are listed, but only abnormal results are displayed) Labs Reviewed  BASIC METABOLIC PANEL - Abnormal; Notable for the following:       Result Value   Chloride 97 (*)    All other components within normal limits  CBC - Abnormal; Notable for the following:    WBC 13.1 (*)    All other components within normal limits  PROTIME-INR  TROPONIN I  I-STAT TROPOININ, ED    EKG  EKG Interpretation None       Radiology Dg Chest 2 View  Result Date: 12/02/2016 CLINICAL DATA:  Dyspnea on exertion for several days EXAM: CHEST  2 VIEW COMPARISON:  06/09/2016 FINDINGS: Cardiac shadow is within normal limits. The lungs are hyper expanded bilaterally consistent with COPD. Chronic scarring is noted bilaterally. No acute infiltrate is seen. No bony abnormality is noted. IMPRESSION: COPD without acute abnormality. Electronically Signed   By:  Alcide CleverMark  Lukens M.D.   On: 12/02/2016 15:57   Ct Angio Chest Pe W Or Wo Contrast  Result Date: 12/03/2016 CLINICAL DATA:  Shortness of Breath EXAM: CT ANGIOGRAPHY CHEST WITH CONTRAST TECHNIQUE: Multidetector CT imaging of the chest was performed using the standard protocol during bolus administration of intravenous contrast. Multiplanar CT image reconstructions and MIPs were obtained to evaluate the vascular anatomy. CONTRAST:  80 mL Isovue 370. COMPARISON:  01/23/2014 FINDINGS: Cardiovascular: Thoracic aorta demonstrates atherosclerotic calcifications without aneurysmal dilatation or dissection. The pulmonary artery is well visualized and demonstrates multiple filling defects bilaterally consistent with pulmonary emboli. The RV/ LV ratio is approximately 1 suggesting some mild right heart strain. Scattered coronary calcifications are seen. Mediastinum/Nodes: The thoracic inlet is within normal limits. No significant hilar or mediastinal adenopathy is identified. Lungs/Pleura: Emphysematous changes are noted in the lungs bilaterally. Stable nodular density is noted in the upper lobe on the right. It again measures approximately 10 x 7 mm. Mild atelectatic changes are noted in the left lower lobe. Some patchy changes are noted in the right lower lobe which may be related to pulmonary infarct. Some right upper lobe scarring is noted sequela from prior infarct. Upper Abdomen: Within normal limits. Musculoskeletal: Degenerative change of the thoracic spine is seen. No acute rib abnormality is noted. Review of the MIP images confirms the above findings. IMPRESSION: Positive for acute PE with CTevidence of right heart strain (RV/LV Ratio = 1) consistent with at least submassive (intermediate risk) PE. The presence of right heart strain has been associated with an increased risk of morbidity and mortality. Changes are noted in the right lower lobe likely related to early pulmonary infarct. Stable right upper lobe nodule.  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 Critical Value/emergent results were called by telephone at the time of interpretation on 12/03/2016 at 10:59 am to [facility] at the referring office, who verbally acknowledged these results and will pass to Kandice RobinsonsSarah Groce, NP. Electronically Signed   By: Alcide CleverMark  Lukens M.D.   On: 12/03/2016 11:04    Procedures Procedures (including critical care time)  CRITICAL CARE Performed by: Raeford RazorKOHUT, Mcclain Shall Total critical care time: 40 minutes Critical care time was exclusive of separately billable procedures and treating other patients. Critical care was  necessary to treat or prevent imminent or life-threatening deterioration. Critical care was time spent personally by me on the following activities: development of treatment plan with patient and/or surrogate as well as nursing, discussions with consultants, evaluation of patient's response to treatment, examination of patient, obtaining history from patient or surrogate, ordering and performing treatments and interventions, ordering and review of laboratory studies, ordering and review of radiographic studies, pulse oximetry and re-evaluation of patient's condition.   Medications Ordered in ED Medications - No data to display   Initial Impression / Assessment and Plan / ED Course  I have reviewed the triage vital signs and the nursing notes.  Pertinent labs & imaging results that were available during my care of the patient were reviewed by me and considered in my medical decision making (see chart for details).    61 year old male with right thoracic back pain and significant dyspnea with exertion. CT done as an outpatient significant is for submassive PE, possible early pulmonary infarction and some right heart strain. He is not hypoxic or hypotensive. Will check a troponin.  He is followed by pulmonology as an outpatient. Will discuss with them for possible admission. If not, will admit  to the medical service. Will start unfractionated heparin for now, but doesn't seem he is currently a candidate for direct lysis. He does have assymmetric LE swelling, but he reports this is chronic/unchanged.   Final Clinical Impressions(s) / ED Diagnoses   Final diagnoses:  Other acute pulmonary embolism with acute cor pulmonale (HCC)    New Prescriptions New Prescriptions   No medications on file   I personally preformed the services scribed in my presence. The recorded information has been reviewed is accurate. Raeford Razor, MD.     Raeford Razor, MD 12/03/16 [phone]

## 2016-12-03 NOTE — ED Notes (Signed)
Patient advised that admitting doctor stated he may be discharged as long as his echocardiogram was okay. This RN page admitting physician to see how to proceed before taking patient up to bed on 3E

## 2016-12-03 NOTE — ED Notes (Signed)
Tiffany, RN on 3E made aware that Dr Toya SmothersMerell stated patient will most likely be discharged and that RN getting handoff from this RN will call when a definitive decision is made about if patient will be discharged or coming to the floor.

## 2016-12-03 NOTE — Telephone Encounter (Signed)
Have called Theodore [name]. I explained that his CXR was clear for pneumonia, and that his D-Dimer was slightly positive. We discussed the option of CT Chest Angio. I explained that with his history of PE  ( 2015) we did need to consider this as a possible cause of his shortness of breath, as he has recently ( 07/2016)  Stopped Xaralto.He wanted to proceed with the CT Angio Chest. I have placed the order for the imaging to be done today. He verbalized understanding of the above.

## 2016-12-03 NOTE — ED Notes (Signed)
ED Provider at bedside. 

## 2016-12-03 NOTE — ED Provider Notes (Signed)
  Physical Exam  BP 130/95 (BP Location: Right Arm)   Pulse 89   Temp 97.5 F (36.4 C) (Oral)   Resp 19   Ht 6\' 4"  (1.93 m)   Wt 211 lb (95.7 kg)   SpO2 97%   BMI 25.68 kg/m   Physical Exam  ED Course  Procedures  MDM Negative echocardiogram. After discussion with medicine and pulmonology patient will be discharged.       Benjiman CoreNathan Dorma Altman, MD 12/03/16 [phone]

## 2016-12-03 NOTE — H&P (Signed)
History and Physical    Theodore GuestJames W Melikian AVW:[id number] DOB: 27-Jun-1956 DOA: 12/03/2016  PCP: [name], Gregory, FNP Patient coming from: home  Chief Complaint: DOE/chest pain  HPI: Theodore [name] is a very pleasant 61 y.o. male with medical history significant of COPD on oxygen at night, pulmonary embolus in 2015, hypertension, diabetes presents to the emergency Department chief complaint of dyspnea on exertion and chest pain for the last week. Initial evaluation reveals PE.  Information is obtained from the patient. He reports that he stays ago he developed right sided chest pain that radiated to his back. He describes the pain as sharp worse with deep inspiration and movement. Associated symptoms include dyspnea with exertion productive cough with thick sputum. He states he took some Tylenol with some improvement over the next couple of days the dyspnea with exertion worsened as did the cough. He went to his primary care provider who provided him with a steroid taper and Levaquin. He also had outpatient CT which revealed a PE mild heart strain. He denies headache dizziness syncope or near-syncope. He denies abdominal pain nausea vomiting diarrhea constipation melena. He denies dysuria hematuria frequency or urgency. He does endorse some lower extremity edema with right greater than left.   ED Course: In the emergency department he's afebrile hemodynamically stable and not hypoxic. Provided with heparin gtt  Review of Systems: As per HPI otherwise 10 point review of systems negative.   Ambulatory Status: Moderate independently with a steady gait no recent falls  Past Medical History:  Diagnosis Date  . Anxiety   . Arthritis    "back" (01/24/2014)  . Asthma   . Chronic bronchitis (HCC)   . Cluster headache    "last bout was summer 2014; had them q spring 1991-2001" (01/24/2014)  . COPD (chronic obstructive pulmonary disease) (HCC)   . GERD (gastroesophageal reflux disease)   . NSTEMI  (non-ST elevated myocardial infarction) (HCC) 01/2014  . On home oxygen therapy    "2L; 24/7" (01/24/2014)  . Pneumonia 06/2013; 01/2014  . Pulmonary embolism (HCC) 01/23/2014  . Seasonal allergies     Past Surgical History:  Procedure Laterality Date  . COLONOSCOPY WITH PROPOFOL N/A 10/20/2016   Procedure: COLONOSCOPY WITH PROPOFOL;  Surgeon: Sherrilyn RistHenry L Danis III, MD;  Location: [facility];  Service: Gastroenterology;  Laterality: N/A;  . NASAL SEPTOPLASTY W/ TURBINOPLASTY Left 1983  . TURBINATE REDUCTION Bilateral ~ 1983    Social History   Social History  . Marital status: Divorced    Spouse name: N/A  . Number of children: 0  . Years of education: 14   Occupational History  . [employer]ability [employer]   Social History Main Topics  . Smoking status: Former Smoker    Packs/day: 2.00    Years: 35.00    Types: Cigarettes    Quit date: 01/24/2012  . Smokeless tobacco: Never Used  . Alcohol use Yes     Comment: 01/24/2014 "holidays; family birthdays"  . Drug use: No  . Sexual activity: No   Other Topics Concern  . Not on file   Social History Narrative   [place] and moved around a lot. 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          Allergies  Allergen Reactions  . Naproxen Sodium Itching    Family History  Problem Relation Age of Onset  . Leukemia Father   . Heart disease Father   . Asthma Father   . Asthma  Sister   . Emphysema Sister   . Heart disease Sister   . High blood pressure Mother   . Colon cancer Cousin     First cousin    Prior to Admission medications   Medication Sig Start Date End Date Taking? Authorizing Provider  albuterol (PROVENTIL) (2.5 MG/3ML) 0.083% nebulizer solution Take 2.5 mg by nebulization every 6 (six) hours as needed for wheezing or shortness of breath.   Yes Historical Provider, MD  albuterol (VENTOLIN HFA) 108 (90 Base) MCG/ACT inhaler INHALE 2 PUFFS INTO THE LUNGS EVERY 4 HOURS AS NEEDED FOR  SHORTNESS OF BREATH OR WHEEZING 09/01/16  Yes Tammy S Parrett, NP  budesonide-formoterol (SYMBICORT) 160-4.5 MCG/ACT inhaler Inhale 2 puffs into the lungs 2 (two) times daily. 07/21/16  Yes Lupita Leash, MD  guaiFENesin (MUCINEX) 600 MG 12 hr tablet Take 600 mg by mouth every 12 (twelve) hours as needed (w/ flutter valve).    Yes Historical Provider, MD  levofloxacin (LEVAQUIN) 500 MG tablet Take 1 tablet (500 mg total) by mouth daily. 11/30/16  Yes Leslye Peer, MD  Multiple Vitamin (MULTIVITAMIN WITH MINERALS) TABS tablet Take 1 tablet by mouth 3 (three) times a week.   Yes Historical Provider, MD  OXYGEN-HELIUM IN Inhale 2 L into the lungs at bedtime.    Yes Historical Provider, MD  predniSONE (DELTASONE) 10 MG tablet 4 x 3 days, 3 x 3 days, 2 x 3 days, 1 x 3 days then stop Patient taking differently: Take 30 mg by mouth daily. 4 x 3 days, 3 x 3 days, 2 x 3 days, 1 x 3 days then stop 11/30/16  Yes Leslye Peer, MD  sodium chloride (OCEAN) 0.65 % SOLN nasal spray Place 1 spray into both nostrils 4 (four) times daily as needed for congestion.   Yes Historical Provider, MD  SPIRIVA HANDIHALER 18 MCG inhalation capsule INHALE CONTENTS OF 1 CAPSULE VIA HANDIHALER EVERY DAY 08/13/16  Yes Lupita Leash, MD  triamterene-hydrochlorothiazide (MAXZIDE-25) 37.5-25 MG tablet TAKE 1 TABLET BY MOUTH DAILY 11/30/16  Yes [name], FNP    Physical Exam: Vitals:   12/03/16 1245 12/03/16 1300 12/03/16 1315 12/03/16 1330  BP: 127/84 128/81 136/79 125/72  Pulse: 85 85 83 79  Resp: 16 16 20 14   Temp:      TempSrc:      SpO2: 95% 97% 95% 97%  Weight:      Height:         General:  Appears calm and comfortable No acute distress Eyes:  PERRL, EOMI, normal lids, iris ENT:  grossly normal hearing, lips & tongue, very poor dentition, mucous membranes of his mouth are pink slightly dry Neck:  no LAD, masses or thyromegaly Cardiovascular:  RRR somewhat distant no m/r/g. Trace lower extremity edema  on right 1+ edema on left pedal pulses present and palpable Respiratory:  Increased work of breathing. Breath sounds somewhat distant throughout. Faint end-expiratory wheezing Abdomen:  soft, ntnd, is a bowel sounds throughout no guarding or rebounding Skin:  no rash or induration seen on limited exam Musculoskeletal:  grossly normal tone BUE/BLE, good ROM, no bony abnormality Psychiatric:  grossly normal mood and affect, speech fluent and appropriate, AOx3 Neurologic:  CN 2-12 grossly intact, moves all extremities in coordinated fashion, sensation intact speech clear facial symmetry  Labs on Admission: I have personally reviewed following labs and imaging studies  CBC:  Recent Labs Lab 12/02/16 1611 12/03/16 1156  WBC 12.6* 13.1*  NEUTROABS  11.7*  --   HGB 14.6 14.2  HCT 41.8 42.1  MCV 90.1 92.9  PLT 326.0 260   Basic Metabolic Panel:  Recent Labs Lab 12/02/16 1611 12/03/16 1156  NA 131* 135  K 4.8 4.0  CL 92* 97*  CO2 29 27  GLUCOSE 123* 94  BUN 15 14  CREATININE 0.90 0.97  CALCIUM 9.8 9.3   GFR: Estimated Creatinine Clearance: 96.9 mL/min (by C-G formula based on SCr of 0.97 mg/dL). Liver Function Tests: No results for input(s): AST, ALT, ALKPHOS, BILITOT, PROT, ALBUMIN in the last 168 hours. No results for input(s): LIPASE, AMYLASE in the last 168 hours. No results for input(s): AMMONIA in the last 168 hours. Coagulation Profile:  Recent Labs Lab 12/03/16 1156  INR 0.95   Cardiac Enzymes:  Recent Labs Lab 12/03/16 1214  TROPONINI <0.03   BNP (last 3 results) No results for input(s): PROBNP in the last 8760 hours. HbA1C: No results for input(s): HGBA1C in the last 72 hours. CBG: No results for input(s): GLUCAP in the last 168 hours. Lipid Profile: No results for input(s): CHOL, HDL, LDLCALC, TRIG, CHOLHDL, LDLDIRECT in the last 72 hours. Thyroid Function Tests: No results for input(s): TSH, T4TOTAL, FREET4, T3FREE, THYROIDAB in the last 72  hours. Anemia Panel: No results for input(s): VITAMINB12, FOLATE, FERRITIN, TIBC, IRON, RETICCTPCT in the last 72 hours. Urine analysis:    Component Value Date/Time   COLORURINE YELLOW 05/17/2008 2303   APPEARANCEUR CLEAR 05/17/2008 2303   LABSPEC 1.024 05/17/2008 2303   PHURINE 6.5 05/17/2008 2303   GLUCOSEU NEGATIVE 05/17/2008 2303   HGBUR TRACE (A) 05/17/2008 2303   BILIRUBINUR NEGATIVE 05/17/2008 2303   KETONESUR NEGATIVE 05/17/2008 2303   PROTEINUR NEGATIVE 05/17/2008 2303   UROBILINOGEN 1.0 05/17/2008 2303   NITRITE NEGATIVE 05/17/2008 2303   LEUKOCYTESUR NEGATIVE 05/17/2008 2303    Creatinine Clearance: Estimated Creatinine Clearance: 96.9 mL/min (by C-G formula based on SCr of 0.97 mg/dL).  Sepsis Labs: @LABRCNTIP (procalcitonin:4,lacticidven:4) )No results found for this or any previous visit (from the past 240 hour(s)).   Radiological Exams on Admission: Dg Chest 2 View  Result Date: 12/02/2016 CLINICAL DATA:  Dyspnea on exertion for several days EXAM: CHEST  2 VIEW COMPARISON:  06/09/2016 FINDINGS: Cardiac shadow is within normal limits. The lungs are hyper expanded bilaterally consistent with COPD. Chronic scarring is noted bilaterally. No acute infiltrate is seen. No bony abnormality is noted. IMPRESSION: COPD without acute abnormality. Electronically Signed   By: Alcide Clever M.D.   On: 12/02/2016 15:57   Ct Angio Chest Pe W Or Wo Contrast  Result Date: 12/03/2016 CLINICAL DATA:  Shortness of Breath EXAM: CT ANGIOGRAPHY CHEST WITH CONTRAST TECHNIQUE: Multidetector CT imaging of the chest was performed using the standard protocol during bolus administration of intravenous contrast. Multiplanar CT image reconstructions and MIPs were obtained to evaluate the vascular anatomy. CONTRAST:  80 mL Isovue 370. COMPARISON:  01/23/2014 FINDINGS: Cardiovascular: Thoracic aorta demonstrates atherosclerotic calcifications without aneurysmal dilatation or dissection. The pulmonary  artery is well visualized and demonstrates multiple filling defects bilaterally consistent with pulmonary emboli. The RV/ LV ratio is approximately 1 suggesting some mild right heart strain. Scattered coronary calcifications are seen. Mediastinum/Nodes: The thoracic inlet is within normal limits. No significant hilar or mediastinal adenopathy is identified. Lungs/Pleura: Emphysematous changes are noted in the lungs bilaterally. Stable nodular density is noted in the upper lobe on the right. It again measures approximately 10 x 7 mm. Mild atelectatic changes are noted in the  left lower lobe. Some patchy changes are noted in the right lower lobe which may be related to pulmonary infarct. Some right upper lobe scarring is noted sequela from prior infarct. Upper Abdomen: Within normal limits. Musculoskeletal: Degenerative change of the thoracic spine is seen. No acute rib abnormality is noted. Review of the MIP images confirms the above findings. IMPRESSION: Positive for acute PE with CTevidence of right heart strain (RV/LV Ratio = 1) consistent with at least submassive (intermediate risk) PE. The presence of right heart strain has been associated with an increased risk of morbidity and mortality. Changes are noted in the right lower lobe likely related to early pulmonary infarct. Stable right upper lobe nodule. 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 Critical Value/emergent results were called by telephone at the time of interpretation on 12/03/2016 at 10:59 am to [facility], at the referring office, who verbally acknowledged these results and will pass to Kandice Robinsons, NP. Electronically Signed   By: Alcide Clever M.D.   On: 12/03/2016 11:04    EKG: Independently reviewed. Sinus rhythm with Premature supraventricular complexes Nonspecific ST abnormality  Assessment/Plan Principal Problem:   Chest pain Active Problems:   COPD GOLD IV    DM (diabetes mellitus) (HCC)   Pulmonary  embolism (HCC)   CAD (coronary artery disease)   HBP (high blood pressure)   Anxiety state   DOE (dyspnea on exertion)   #1. Chest pain. Heart score 4. Hx cad. NSTEMI 2015. Likely related to recurrent PE. Initial troponin negative. EKG without acute changes. CT of the chest positive for acute PE with evidence of right heart strain. She is not hypoxic. -Admit to telemetry -Cycle troponins -Serial EKG -Heparin drip -Monitor oxygen saturation level -Oxygen supplementation as indicated  #2. Dyspnea with exertion/acute pulmonary embolism. Recurrent after discontinuing Xarelto 5 months ago. Patient diagnosed with PE in 2015 thought to be provoked by hospitalization. Xarelto discontinued 5 months ago. -See #1 -monitor  #3. COPD. Patient is on home oxygen at night. Appears stable at baseline. -Continue home meds -Monitor oxygen saturation leve   #4. Diabetes. Serum glucose 123 on admission. -Carb modified diet -Monitor  #5. Hypertension. Controlled in the emergency department. Home medications include act side. -We'll hold this for now -Monitor closely -Resume as indicated    DVT prophylaxis: heparin gtt Code Status: full  Family Communication: none present  Disposition Plan: home  Consults called: dr Kristopher Oppenheim  Admission status: obs    Gwenyth Bender MD [facility]  If 7PM-7AM, please contact night-coverage [email or web address] Password [email or web address]  12/03/2016, 1:46 PM

## 2016-12-03 NOTE — Progress Notes (Signed)
ANTICOAGULATION CONSULT NOTE - Initial Consult  Pharmacy Consult for heparin Indication: pulmonary embolus  Allergies  Allergen Reactions  . Naproxen Sodium Itching    Patient Measurements: Height: 6\' 4"  (193 cm) Weight: 211 lb (95.7 kg) IBW/kg (Calculated) : 86.8 Heparin Dosing Weight:  95.7 kg  Vital Signs: Temp: 98.3 F (36.8 C) (01/25 1141) Temp Source: Oral (01/25 1141) BP: 154/98 (01/25 1203) Pulse Rate: 88 (01/25 1141)  Labs:  Recent Labs  12/02/16 1611  HGB 14.6  HCT 41.8  PLT 326.0  CREATININE 0.90    Estimated Creatinine Clearance: 104.5 mL/min (by C-G formula based on SCr of 0.9 mg/dL).   Medical History: Past Medical History:  Diagnosis Date  . Anxiety   . Arthritis    "back" (01/24/2014)  . Asthma   . Chronic bronchitis (HCC)   . Cluster headache    "last bout was summer 2014; had them q spring 1991-2001" (01/24/2014)  . COPD (chronic obstructive pulmonary disease) (HCC)   . GERD (gastroesophageal reflux disease)   . NSTEMI (non-ST elevated myocardial infarction) (HCC) 01/2014  . On home oxygen therapy    "2L; 24/7" (01/24/2014)  . Pneumonia 06/2013; 01/2014  . Pulmonary embolism (HCC) 01/23/2014  . Seasonal allergies     Assessment: CTA positive for acute PE with R heart strain Pt has a history of PE, for which he used to take Xarelto > no longer taking this SCr 0.9, CBC stable and wnl   Goal of Therapy:  Heparin level 0.3-0.7 units/ml Monitor platelets by anticoagulation protocol: Yes   Plan:  -Heparin bolus 4000 units x1 then 1500 units/hr -Daily HL, CBC -Level tonight -F/u plan for oral anticoagulation  Tristine Langi, Darl HouseholderAlison M 12/03/2016,12:18 PM

## 2016-12-03 NOTE — Consult Note (Signed)
Medical Consultation   Theodore [name]  ZOX:[id number]  DOB: 1956-09-23  DOA: 12/03/2016  PCP: Jeanine Luz, FNP   Outpatient Specialists: Kendrick Fries   Requesting physician: Dr Juleen China ED  Reason for consultation: Admission for PE   History of Present Illness: Theodore [name] is an 61 y.o. male with medical history significant of COPD on oxygen at night, pulmonary embolus in 2015, hypertension, diabetes presents to the emergency Department chief complaint of dyspnea on exertion and chest pain for the last week. Initial evaluation reveals PE.  Information is obtained from the patient. He reports that he stays ago he developed right sided chest pain that radiated to his back. He describes the pain as sharp worse with deep inspiration and movement. Associated symptoms include dyspnea with exertion productive cough with thick sputum. He states he took some Tylenol with some improvement over the next couple of days the dyspnea with exertion worsened as did the cough. He went to his primary care provider who provided him with a steroid taper and Levaquin. He also had outpatient CT which revealed a PE mild heart strain. He denies headache dizziness syncope or near-syncope. He denies abdominal pain nausea vomiting diarrhea constipation melena. He denies dysuria hematuria frequency or urgency. He does endorse some lower extremity edema with right greater than left.    Review of Systems:  ROS As per HPI otherwise 10 point review of systems negative.  14 point review of systems complete and all systems are negative except as indicated in the history of present illness Past Medical History: Past Medical History:  Diagnosis Date  . Anxiety   . Arthritis    "back" (01/24/2014)  . Asthma   . Chronic bronchitis (HCC)   . Cluster headache    "last bout was summer 2014; had them q spring 1991-2001" (01/24/2014)  . COPD (chronic obstructive pulmonary disease) (HCC)   . GERD  (gastroesophageal reflux disease)   . NSTEMI (non-ST elevated myocardial infarction) (HCC) 01/2014  . On home oxygen therapy    "2L; 24/7" (01/24/2014)  . Pneumonia 06/2013; 01/2014  . Pulmonary embolism (HCC) 01/23/2014  . Seasonal allergies     Past Surgical History: Past Surgical History:  Procedure Laterality Date  . COLONOSCOPY WITH PROPOFOL N/A 10/20/2016   Procedure: COLONOSCOPY WITH PROPOFOL;  Surgeon: Sherrilyn Rist, MD;  Location: [facility];  Service: Gastroenterology;  Laterality: N/A;  . NASAL SEPTOPLASTY W/ TURBINOPLASTY Left 1983  . TURBINATE REDUCTION Bilateral ~ 1983     Allergies:   Allergies  Allergen Reactions  . Naproxen Sodium Itching     Social History:  reports that he quit smoking about 4 years ago. His smoking use included Cigarettes. He has a 70.00 pack-year smoking history. He has never used smokeless tobacco. He reports that he drinks alcohol. He reports that he does not use drugs.   Family History: Family History  Problem Relation Age of Onset  . Leukemia Father   . Heart disease Father   . Asthma Father   . Asthma Sister   . Emphysema Sister   . Heart disease Sister   . High blood pressure Mother   . Colon cancer Cousin     First cousin     Physical Exam: Vitals:   12/03/16 1300 12/03/16 1315 12/03/16 1330 12/03/16 1345  BP: 128/81 136/79 125/72 121/80  Pulse: 85 83 79 83  Resp: 16 20 14  17  Temp:      TempSrc:      SpO2: 97% 95% 97% 96%  Weight:      Height:        Constitutional: Appearance,  Alert and awake, oriented x3, not in any acute distress. Eyes: PERLA, EOMI, irises appear normal, anicteric sclera,  ENMT: external ears and nose appear normal, normal hearing or hard of hearing            Lips appears normal, oropharynx mucosa, tongue, posterior pharynx appear normal Report dentition  Neck: neck appears normal, no masses, normal ROM, no thyromegaly, no JVD  CVS: Regular rate and rhythm heart sounds somewhat distant  no murmur gallop or rub. Asymmetrical lower extremity edema with left greater than right  Respiratory:  Normal effort throughout. Breath sounds somewhat diminished. I do hear faint end expiratory wheezing. No crackles Abdomen: soft nontender, nondistended, normal bowel sounds, no hepatosplenomegaly, no hernias  Musculoskeletal: : no cyanosis, clubbing or edema noted bilaterally                       Neuro: Cranial nerves II-XII intact, strength, sensation, reflexes Psych: judgement and insight appear normal, stable mood and affect, mental status Skin: no rashes or lesions or ulcers, no induration or nodules   Data reviewed:  I have personally reviewed following labs and imaging studies Labs:  CBC:  Recent Labs Lab 12/02/16 1611 12/03/16 1156  WBC 12.6* 13.1*  NEUTROABS 11.7*  --   HGB 14.6 14.2  HCT 41.8 42.1  MCV 90.1 92.9  PLT 326.0 260    Basic Metabolic Panel:  Recent Labs Lab 12/02/16 1611 12/03/16 1156  NA 131* 135  K 4.8 4.0  CL 92* 97*  CO2 29 27  GLUCOSE 123* 94  BUN 15 14  CREATININE 0.90 0.97  CALCIUM 9.8 9.3   GFR Estimated Creatinine Clearance: 96.9 mL/min (by C-G formula based on SCr of 0.97 mg/dL). Liver Function Tests: No results for input(s): AST, ALT, ALKPHOS, BILITOT, PROT, ALBUMIN in the last 168 hours. No results for input(s): LIPASE, AMYLASE in the last 168 hours. No results for input(s): AMMONIA in the last 168 hours. Coagulation profile  Recent Labs Lab 12/03/16 1156  INR 0.95    Cardiac Enzymes:  Recent Labs Lab 12/03/16 1214  TROPONINI <0.03   BNP: Invalid input(s): POCBNP CBG: No results for input(s): GLUCAP in the last 168 hours. D-Dimer  Recent Labs  12/02/16 1611  DDIMER 1.56*   Hgb A1c No results for input(s): HGBA1C in the last 72 hours. Lipid Profile No results for input(s): CHOL, HDL, LDLCALC, TRIG, CHOLHDL, LDLDIRECT in the last 72 hours. Thyroid function studies No results for input(s): TSH, T4TOTAL,  T3FREE, THYROIDAB in the last 72 hours.  Invalid input(s): FREET3 Anemia work up No results for input(s): VITAMINB12, FOLATE, FERRITIN, TIBC, IRON, RETICCTPCT in the last 72 hours. Urinalysis    Component Value Date/Time   COLORURINE YELLOW 05/17/2008 2303   APPEARANCEUR CLEAR 05/17/2008 2303   LABSPEC 1.024 05/17/2008 2303   PHURINE 6.5 05/17/2008 2303   GLUCOSEU NEGATIVE 05/17/2008 2303   HGBUR TRACE (A) 05/17/2008 2303   BILIRUBINUR NEGATIVE 05/17/2008 2303   KETONESUR NEGATIVE 05/17/2008 2303   PROTEINUR NEGATIVE 05/17/2008 2303   UROBILINOGEN 1.0 05/17/2008 2303   NITRITE NEGATIVE 05/17/2008 2303   LEUKOCYTESUR NEGATIVE 05/17/2008 2303     Microbiology No results found for this or any previous visit (from the past 240 hour(s)).     Inpatient  Medications:   Scheduled Meds: . albuterol  2 puff Inhalation Q6H  . levofloxacin  500 mg Oral Daily  . mometasone-formoterol  2 puff Inhalation BID  . tiotropium  18 mcg Inhalation Daily   Continuous Infusions: . sodium chloride    . heparin 1,500 Units/hr (12/03/16 1343)     Radiological Exams on Admission: Dg Chest 2 View  Result Date: 12/02/2016 CLINICAL DATA:  Dyspnea on exertion for several days EXAM: CHEST  2 VIEW COMPARISON:  06/09/2016 FINDINGS: Cardiac shadow is within normal limits. The lungs are hyper expanded bilaterally consistent with COPD. Chronic scarring is noted bilaterally. No acute infiltrate is seen. No bony abnormality is noted. IMPRESSION: COPD without acute abnormality. Electronically Signed   By: Alcide Clever M.D.   On: 12/02/2016 15:57   Ct Angio Chest Pe W Or Wo Contrast  Result Date: 12/03/2016 CLINICAL DATA:  Shortness of Breath EXAM: CT ANGIOGRAPHY CHEST WITH CONTRAST TECHNIQUE: Multidetector CT imaging of the chest was performed using the standard protocol during bolus administration of intravenous contrast. Multiplanar CT image reconstructions and MIPs were obtained to evaluate the vascular  anatomy. CONTRAST:  80 mL Isovue 370. COMPARISON:  01/23/2014 FINDINGS: Cardiovascular: Thoracic aorta demonstrates atherosclerotic calcifications without aneurysmal dilatation or dissection. The pulmonary artery is well visualized and demonstrates multiple filling defects bilaterally consistent with pulmonary emboli. The RV/ LV ratio is approximately 1 suggesting some mild right heart strain. Scattered coronary calcifications are seen. Mediastinum/Nodes: The thoracic inlet is within normal limits. No significant hilar or mediastinal adenopathy is identified. Lungs/Pleura: Emphysematous changes are noted in the lungs bilaterally. Stable nodular density is noted in the upper lobe on the right. It again measures approximately 10 x 7 mm. Mild atelectatic changes are noted in the left lower lobe. Some patchy changes are noted in the right lower lobe which may be related to pulmonary infarct. Some right upper lobe scarring is noted sequela from prior infarct. Upper Abdomen: Within normal limits. Musculoskeletal: Degenerative change of the thoracic spine is seen. No acute rib abnormality is noted. Review of the MIP images confirms the above findings. IMPRESSION: Positive for acute PE with CTevidence of right heart strain (RV/LV Ratio = 1) consistent with at least submassive (intermediate risk) PE. The presence of right heart strain has been associated with an increased risk of morbidity and mortality. Changes are noted in the right lower lobe likely related to early pulmonary infarct. Stable right upper lobe nodule. 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 Critical Value/emergent results were called by telephone at the time of interpretation on 12/03/2016 at 10:59 am to [facility], at the referring office, who verbally acknowledged these results and will pass to Kandice Robinsons, NP. Electronically Signed   By: Alcide Clever M.D.   On: 12/03/2016 11:04    Impression/Recommendations Principal  Problem:   Chest pain Active Problems:   COPD GOLD IV    DM (diabetes mellitus) (HCC)   Pulmonary embolism (HCC)   CAD (coronary artery disease)   HBP (high blood pressure)   Anxiety state   DOE (dyspnea on exertion)  #1. Chest pain. Heart score 4. Hx cad. NSTEMI 2015. Likely related to recurrent PE. Initial troponin negative. EKG without acute changes. CT of the chest positive for acute PE with evidence of right heart strain. She is not hypoxic. -chest pain resolved -DOE improved, ocygen saturation level greater than 90% -obtain echo -Recommendation: discharge to home. Follow up with pulmonary 1-2 weeks  #2. Dyspnea with exertion/acute  pulmonary embolism. Recurrent after discontinuing Xarelto 5 months ago. Patient diagnosed with PE in 2015 thought to be provoked by hospitalization. Xarelto discontinued 5 months ago. Evaluated by pulmonology. Pulmonology agreed with recommendation to discharge to home with anti-coag if no significant findings on echo.  -RECOMMENDATION discharge to home with anti-coagulation -follow up with PCP 1-2 weeks  #3. COPD. Patient is on home oxygen at night. Appears stable at baseline. -Continue home med   #4. Diabetes. Serum glucose 123 on admission  #5. Hypertension. Controlled in the emergency department.   Thank you for this consultation.    Time Spent: 1 hour  Gwenyth Bender M.D. [facility] 12/03/2016, 2:05 PM

## 2016-12-03 NOTE — Consult Note (Signed)
PULMONARY / CRITICAL CARE MEDICINE   Name: Theodore [name] MRN: [id number] DOB: 1956-10-27    ADMISSION DATE:  12/03/2016 CONSULTATION DATE:  1/25  REFERRING MD:  EDP  CHIEF COMPLAINT:  DOE/Chest pain  HISTORY OF PRESENT ILLNESS:   61 yo former smoker quit cigarettes 6 years ago(still uses nicorette gum) COPD with SOB walking >60 feet not on O2. Who had a PE 2015 after being in the hospital. Noel Christmas stopped 5 months ago as it was presumed reaction to hospital caused pe. He notes that last Saturday he had rt side chest pain and increased DOE that has worsened. Seen in the office and repeat CTA reveals PE with mild RV strain. He will be admitted and PCCM will follow along as a consult.  PAST MEDICAL HISTORY :  He  has a past medical history of Anxiety; Arthritis; Asthma; Chronic bronchitis (HCC); Cluster headache; COPD (chronic obstructive pulmonary disease) (HCC); GERD (gastroesophageal reflux disease); NSTEMI (non-ST elevated myocardial infarction) (HCC) (01/2014); On home oxygen therapy; Pneumonia (06/2013; 01/2014); Pulmonary embolism (HCC) (01/23/2014); and Seasonal allergies.  PAST SURGICAL HISTORY: He  has a past surgical history that includes Nasal septoplasty w/ turbinoplasty (Left, 1983); Turbinate reduction (Bilateral, ~ 1983); and Colonoscopy with propofol (N/A, 10/20/2016).  Allergies  Allergen Reactions  . Naproxen Sodium Itching    No current facility-administered medications on file prior to encounter.    Current Outpatient Prescriptions on File Prior to Encounter  Medication Sig  . albuterol (PROVENTIL) (2.5 MG/3ML) 0.083% nebulizer solution Take 2.5 mg by nebulization every 6 (six) hours as needed for wheezing or shortness of breath.  Marland Kitchen albuterol (VENTOLIN HFA) 108 (90 Base) MCG/ACT inhaler INHALE 2 PUFFS INTO THE LUNGS EVERY 4 HOURS AS NEEDED FOR SHORTNESS OF BREATH OR WHEEZING  . budesonide-formoterol (SYMBICORT) 160-4.5 MCG/ACT inhaler Inhale 2 puffs into the lungs 2  (two) times daily.  Marland Kitchen guaiFENesin (MUCINEX) 600 MG 12 hr tablet Take 600 mg by mouth every 12 (twelve) hours as needed (w/ flutter valve).   Marland Kitchen levofloxacin (LEVAQUIN) 500 MG tablet Take 1 tablet (500 mg total) by mouth daily.  . Multiple Vitamin (MULTIVITAMIN WITH MINERALS) TABS tablet Take 1 tablet by mouth 3 (three) times a week.  . OXYGEN-HELIUM IN Inhale 2 L into the lungs at bedtime.   . predniSONE (DELTASONE) 10 MG tablet 4 x 3 days, 3 x 3 days, 2 x 3 days, 1 x 3 days then stop (Patient taking differently: Take 30 mg by mouth daily. 4 x 3 days, 3 x 3 days, 2 x 3 days, 1 x 3 days then stop)  . sodium chloride (OCEAN) 0.65 % SOLN nasal spray Place 1 spray into both nostrils 4 (four) times daily as needed for congestion.  Marland Kitchen SPIRIVA HANDIHALER 18 MCG inhalation capsule INHALE CONTENTS OF 1 CAPSULE VIA HANDIHALER EVERY DAY  . triamterene-hydrochlorothiazide (MAXZIDE-25) 37.5-25 MG tablet TAKE 1 TABLET BY MOUTH DAILY    FAMILY HISTORY:  His indicated that his mother is alive. He indicated that the status of his father is unknown. He indicated that the status of his cousin is unknown.    SOCIAL HISTORY: He  reports that he quit smoking about 4 years ago. His smoking use included Cigarettes. He has a 70.00 pack-year smoking history. He has never used smokeless tobacco. He reports that he drinks alcohol. He reports that he does not use drugs.  REVIEW OF SYSTEMS:   10 point review of system taken, please see HPI for positives and negatives.  SUBJECTIVE:  NAD  VITAL SIGNS: BP 154/98   Pulse 88   Temp 98.3 F (36.8 C) (Oral)   Resp 23   Ht 6\' 4"  (1.93 m)   Wt 211 lb (95.7 kg)   SpO2 98%   BMI 25.68 kg/m   HEMODYNAMICS:    VENTILATOR SETTINGS:    INTAKE / OUTPUT: No intake/output data recorded.  PHYSICAL EXAMINATION: General:WNWD WM in NAD Neuro: Intact HEENT: No JVD/LAN Cardiovascular:  HSr RRR Lungs: MIld rhonchi Abdomen:  Soft +bs Musculoskeletal:  Intact Skin:   Warm and dry  LABS:  BMET  Recent Labs Lab 12/02/16 1611 12/03/16 1156  NA 131* 135  K 4.8 4.0  CL 92* 97*  CO2 29 27  BUN 15 14  CREATININE 0.90 0.97  GLUCOSE 123* 94    Electrolytes  Recent Labs Lab 12/02/16 1611 12/03/16 1156  CALCIUM 9.8 9.3    CBC  Recent Labs Lab 12/02/16 1611 12/03/16 1156  WBC 12.6* 13.1*  HGB 14.6 14.2  HCT 41.8 42.1  PLT 326.0 260    Coag's  Recent Labs Lab 12/03/16 1156  INR 0.95    Sepsis Markers No results for input(s): LATICACIDVEN, PROCALCITON, O2SATVEN in the last 168 hours.  ABG No results for input(s): PHART, PCO2ART, PO2ART in the last 168 hours.  Liver Enzymes No results for input(s): AST, ALT, ALKPHOS, BILITOT, ALBUMIN in the last 168 hours.  Cardiac Enzymes No results for input(s): TROPONINI, PROBNP in the last 168 hours.  Glucose No results for input(s): GLUCAP in the last 168 hours.  Imaging Dg Chest 2 View  Result Date: 12/02/2016 CLINICAL DATA:  Dyspnea on exertion for several days EXAM: CHEST  2 VIEW COMPARISON:  06/09/2016 FINDINGS: Cardiac shadow is within normal limits. The lungs are hyper expanded bilaterally consistent with COPD. Chronic scarring is noted bilaterally. No acute infiltrate is seen. No bony abnormality is noted. IMPRESSION: COPD without acute abnormality. Electronically Signed   By: Alcide CleverMark  Lukens M.D.   On: 12/02/2016 15:57   Ct Angio Chest Pe W Or Wo Contrast  Result Date: 12/03/2016 CLINICAL DATA:  Shortness of Breath EXAM: CT ANGIOGRAPHY CHEST WITH CONTRAST TECHNIQUE: Multidetector CT imaging of the chest was performed using the standard protocol during bolus administration of intravenous contrast. Multiplanar CT image reconstructions and MIPs were obtained to evaluate the vascular anatomy. CONTRAST:  80 mL Isovue 370. COMPARISON:  01/23/2014 FINDINGS: Cardiovascular: Thoracic aorta demonstrates atherosclerotic calcifications without aneurysmal dilatation or dissection. The pulmonary  artery is well visualized and demonstrates multiple filling defects bilaterally consistent with pulmonary emboli. The RV/ LV ratio is approximately 1 suggesting some mild right heart strain. Scattered coronary calcifications are seen. Mediastinum/Nodes: The thoracic inlet is within normal limits. No significant hilar or mediastinal adenopathy is identified. Lungs/Pleura: Emphysematous changes are noted in the lungs bilaterally. Stable nodular density is noted in the upper lobe on the right. It again measures approximately 10 x 7 mm. Mild atelectatic changes are noted in the left lower lobe. Some patchy changes are noted in the right lower lobe which may be related to pulmonary infarct. Some right upper lobe scarring is noted sequela from prior infarct. Upper Abdomen: Within normal limits. Musculoskeletal: Degenerative change of the thoracic spine is seen. No acute rib abnormality is noted. Review of the MIP images confirms the above findings. IMPRESSION: Positive for acute PE with CTevidence of right heart strain (RV/LV Ratio = 1) consistent with at least submassive (intermediate risk) PE. The presence of right heart strain  has been associated with an increased risk of morbidity and mortality. Changes are noted in the right lower lobe likely related to early pulmonary infarct. Stable right upper lobe nodule. 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 Critical Value/emergent results were called by telephone at the time of interpretation on 12/03/2016 at 10:59 am to [facility], at the referring office, who verbally acknowledged these results and will pass to Kandice Robinsons, NP. Electronically Signed   By: Alcide Clever M.D.   On: 12/03/2016 11:04     STUDIES:    CULTURES:   ANTIBIOTICS:   SIGNIFICANT EVENTS:   LINES/TUBES:   DISCUSSION: 61 yo former smoker quit cigarettes 6 years ago(still uses nicorette gum) COPD with SOB walking >60 feet not on O2. Who had a PE 2015 after being  in the hospital. Noel Christmas stopped 5 months ago as it was presumed reaction to hospital caused pe. He notes that last Saturday he had rt side chest pain and increased DOE that has worsened. Seen in the office and repeat CTA reveals PE with mild RV strain. He will be admitted and PCCM will follow along as a consult.  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 anticogulation with rusty sputum BD's as at home  CARDIOVASCULAR A:  CAD post STEMI HTN P:  Hold antihypertensives for now  RENAL A:   No acute issue P:     GASTROINTESTINAL A:   GERD P:   PPI  HEMATOLOGIC A:   Hx of PE with reoccurrence  P:  See pulmonary  INFECTIOUS A:   No overt s/s of infection P:   Monitor for s/s of infection    NEUROLOGIC A:   No acute issue Anxiety P:   RASS goal: 0 No sedation needed   FAMILY  - Updates: Pt updated at bedside  - Inter-disciplinary family meet or Palliative Care meeting due by:  day     Brett Canales Yumi Insalaco ACNP [facility] PCCM Pager [phone] till 3 pm If no answer page [phone] 12/03/2016, 12:47 PM

## 2016-12-03 NOTE — Telephone Encounter (Signed)
I have called [name] to let him know he is scheduled for a CT Angio at the [facility] scanner at 10:30. I explained no solid food for 2 hours prior. I told him coffee, water and tea are ok. We discussed the address and location of the scanner. He verbalized understanding of the above and had no further questions upon completion of the call.

## 2016-12-03 NOTE — ED Notes (Signed)
DR. Konrad DoloresMerrell advised to keep patient in ED since patient will most likely be discharged home from here.

## 2016-12-03 NOTE — ED Notes (Signed)
Family at bedside. 

## 2016-12-06 NOTE — Progress Notes (Signed)
Reviewed, agree 

## 2016-12-08 NOTE — Progress Notes (Signed)
Results were given to patient who verbalized understanding.

## 2016-12-08 NOTE — Progress Notes (Signed)
These labs results were reported to the patient who verbalized understanding

## 2016-12-23 ENCOUNTER — Ambulatory Visit (INDEPENDENT_AMBULATORY_CARE_PROVIDER_SITE_OTHER): Payer: Medicare Other | Admitting: Acute Care

## 2016-12-23 ENCOUNTER — Encounter: Payer: Self-pay | Admitting: Acute Care

## 2016-12-23 ENCOUNTER — Other Ambulatory Visit (INDEPENDENT_AMBULATORY_CARE_PROVIDER_SITE_OTHER): Payer: Medicare Other

## 2016-12-23 VITALS — BP 112/82 | HR 89 | Ht 76.0 in | Wt 217.0 lb

## 2016-12-23 DIAGNOSIS — Z7901 Long term (current) use of anticoagulants: Secondary | ICD-10-CM

## 2016-12-23 DIAGNOSIS — J9601 Acute respiratory failure with hypoxia: Secondary | ICD-10-CM

## 2016-12-23 DIAGNOSIS — J449 Chronic obstructive pulmonary disease, unspecified: Secondary | ICD-10-CM

## 2016-12-23 DIAGNOSIS — I2602 Saddle embolus of pulmonary artery with acute cor pulmonale: Secondary | ICD-10-CM

## 2016-12-23 LAB — CBC WITH DIFFERENTIAL/PLATELET
BASOS ABS: 0.1 10*3/uL (ref 0.0–0.1)
BASOS PCT: 0.8 % (ref 0.0–3.0)
Eosinophils Absolute: 0.1 10*3/uL (ref 0.0–0.7)
Eosinophils Relative: 0.4 % (ref 0.0–5.0)
HEMATOCRIT: 42.5 % (ref 39.0–52.0)
HEMOGLOBIN: 14.2 g/dL (ref 13.0–17.0)
LYMPHS PCT: 8.4 % — AB (ref 12.0–46.0)
Lymphs Abs: 1.1 10*3/uL (ref 0.7–4.0)
MCHC: 33.5 g/dL (ref 30.0–36.0)
MCV: 91.7 fl (ref 78.0–100.0)
MONOS PCT: 4.3 % (ref 3.0–12.0)
Monocytes Absolute: 0.6 10*3/uL (ref 0.1–1.0)
Neutro Abs: 11.8 10*3/uL — ABNORMAL HIGH (ref 1.4–7.7)
Neutrophils Relative %: 86.1 % — ABNORMAL HIGH (ref 43.0–77.0)
Platelets: 416 10*3/uL — ABNORMAL HIGH (ref 150.0–400.0)
RBC: 4.64 Mil/uL (ref 4.22–5.81)
RDW: 15 % (ref 11.5–15.5)
WBC: 13.7 10*3/uL — AB (ref 4.0–10.5)

## 2016-12-23 MED ORDER — RIVAROXABAN 20 MG PO TABS: 20.0000 mg | ORAL_TABLET | Freq: Every day | ORAL | 6 refills | Status: DC

## 2016-12-23 MED ORDER — ALBUTEROL SULFATE HFA 108 (90 BASE) MCG/ACT IN AERS: INHALATION_SPRAY | RESPIRATORY_TRACT | 3 refills | Status: DC

## 2016-12-23 NOTE — Patient Instructions (Addendum)
We will check a CBC today. Continue your Symbicort daily as you have been doing. Continue your Spiriva daily as you have been doing. Rinse mouth after use. Continue your prednisone 10 mg daily.  Wean back to your 5 mg as able. We will renew your prescription for Xaralto 20 mg once daily with food , with refills for 6 months. We will refill you proventil rescue inhaler Bleeding precautions while on Xaralto. Care with kitchen prep and shaving. Compression to any bleeding. If it does not stop, seek emergency care. Follow up with Dr. Kendrick FriesMcQuaid 01/11/2017 as is scheduled. Please contact office for sooner follow up if symptoms do not improve or worsen or seek emergency care

## 2016-12-23 NOTE — Assessment & Plan Note (Signed)
Wear oxygen at 2 L [place] at night as ordered Maintain saturations 88-92%

## 2016-12-23 NOTE — Assessment & Plan Note (Addendum)
Second pulmonary embolism after discontinuation of Xarelto September 2017 Will require lifelong anticoagulation Plan We will renew your prescription for Xaralto 20 mg once daily with food , with refills for 6 months. Bleeding precautions while on Xaralto. Care with kitchen prep and shaving. Compression to any bleeding. If it does not stop, seek emergency care. Hemoglobin stable after initiation of Xarelto treatment. Follow up with Dr. Kendrick FriesMcQuaid 01/11/2017 as is scheduled. Please contact office for sooner follow up if symptoms do not improve or worsen or seek emergency care

## 2016-12-23 NOTE — Assessment & Plan Note (Addendum)
Resolving exacerbation caused by PE Leukocytosis suspected secondary to prednisone treatment Afebrile, no purulent secretions. Plan: Continue your Symbicort daily as you have been doing. Continue your Spiriva daily as you have been doing. Rinse mouth after use. Continue your prednisone 10 mg daily.  Wean back to your 5 mg as able. We will refill you proventil rescue inhaler Follow up with Dr. Kendrick FriesMcQuaid 01/11/2017 as is scheduled. Please contact office for sooner follow up if symptoms do not improve or worsen or seek emergency care

## 2016-12-23 NOTE — Assessment & Plan Note (Signed)
Second PE 12/03/2016 off Xaralto since 07/2017 Submassive with early pulmonary infarction Some R heart strain Troponins Negative Echo EF 60-65% Plan: We will check a CBC today. We will renew your prescription for Xaralto 20 mg once daily with food , with refills for 6 months. Take this medication without fail. Bleeding precautions while on Xaralto. Care with kitchen prep and shaving. Compression to any bleeding. If it does not stop, seek emergency care. Will need follow up echo in 5-6 months. Follow up with Dr. Kendrick FriesMcQuaid 01/11/2017 as is scheduled. Please contact office for sooner follow up if symptoms do not improve or worsen or seek emergency care

## 2016-12-23 NOTE — Progress Notes (Signed)
History of Present Illness Theodore [name] is a 61 y.o. male with stage IV COPD (steroid dependent) and history of a provoked pulmonary embolism after a hospitalization in 2015. Theodore [name] was discontinued 07/2016, and patient had a second PE 11/2016. He is on chronic anticoagulation. He was  formerly followed by Theodore [name] is now followed by Theodore [name]    12/23/2016 ED Follow Up Appointment post PE: Patient presents to the office for follow-up of submassive pulmonary embolism with early pulmonary infarction and some right heart strain diagnosed December 03 2016 by [facility]. This is the second pulmonary embolism for the patient. Initial pulmonary embolism was in 2015 and was thought to be provoked posthospitalization due to decreased mobility. He was on Xarelto through 9/ 2017. He presented to the office on 12/02/2016 with worsening dyspnea on exertion and back pain. Elevated D-dimer and CT angiogram revealed  pulmonary embolism. Patient was sent to the emergency room, however due to the fact troponins were negative he was hemodynamically stable with  2-D echo that was was unimpaired ( EF 60-65%),  he was discharged home on Xaralto. He returns today for follow-up.He states he has been doing well, but is not back to his baseline.. He has been compliant with his Xaralto 15 mg twice daily since 12/03/2016.Marland Kitchen He will start the 20 mg daily within the next 3-4 days. He has the Xaralto dose pack which was dispensed at the hospital. He states he can tolerate short trips. He is able to walk longer distances since starting on the [place]. He completed his prednisone taper and is back to his 10 mg daily. He knows to try and wean back to 5 mg daily.He also completed his Levaquin. He continues to have some back pain, much less severe than 12/03/2016. There is no associated worsening shortness of breath. He does still have a cough. He has not had to use his rescue inhaler in 2 days. He is using his nebs twice daily a few days a  week for DOE.He denies chest pain, fever, orthopnea or hemoptysis. He did bite his tongue this morning, but it has stopped bleeding. He was counseled on bleeding precautions. We also discussed that he would need life long anticoagulation. We discussed that he will  also need repeat echo in approximately 6 months.He verbalized understanding.He denies fever, chest pain, orthopnea or hemoptysis.    Tests CBC>> 12/23/2016 Results for [name], Theodore GONGORA (MRN [id number]) as of 12/23/2016 19:03  Ref. Range 12/23/2016 14:35  WBC Latest Ref Range: 4.0 - 10.5 K/uL 13.7 (H)  RBC Latest Ref Range: 4.22 - 5.81 Mil/uL 4.64  Hemoglobin Latest Ref Range: 13.0 - 17.0 g/dL 40.9  HCT Latest Ref Range: 39.0 - 52.0 % 42.5  MCV Latest Ref Range: 78.0 - 100.0 fl 91.7  MCHC Latest Ref Range: 30.0 - 36.0 g/dL 81.1  RDW Latest Ref Range: 11.5 - 15.5 % 15.0  Platelets Latest Ref Range: 150.0 - 400.0 K/uL 416.0 (H)    CT Angio 12/03/2016 IMPRESSION: Positive for acute PE with CTevidence of right heart strain (RV/LV Ratio = 1) consistent with at least submassive (intermediate risk) PE. The presence of right heart strain has been associated with an increased risk of morbidity and mortality. Changes are noted in the right lower lobe likely related to early pulmonary infarct. Stable right upper lobe nodule Right upper lobe scarring from prior sequelae of pulmonary infarct.   Echo 12/03/2016 Left ventricle: The cavity size was normal. Systolic function was   normal.  The estimated ejection fraction was in the range of 60%   to 65%. Wall motion was normal; there were no regional wall   motion abnormalities. - Aortic root: The aortic root was mildly dilated. - Pulmonary arteries: Systolic pressure was mildly increased.  Troponin 12/03/2016>> < 0.03 x 2  Past medical hx Past Medical History:  Diagnosis Date  . Anxiety   . Arthritis    "back" (01/24/2014)  . Asthma   . Chronic bronchitis (HCC)   . Cluster  headache    "last bout was summer 2014; had them q spring 1991-2001" (01/24/2014)  . COPD (chronic obstructive pulmonary disease) (HCC)   . GERD (gastroesophageal reflux disease)   . NSTEMI (non-ST elevated myocardial infarction) (HCC) 01/2014  . On home oxygen therapy    "2L; 24/7" (01/24/2014)  . Pneumonia 06/2013; 01/2014  . Pulmonary embolism (HCC) 01/23/2014  . Seasonal allergies      Past surgical hx, Family hx, Social hx all reviewed.  Current Outpatient Prescriptions on File Prior to Visit  Medication Sig  . albuterol (PROVENTIL) (2.5 MG/3ML) 0.083% nebulizer solution Take 2.5 mg by nebulization every 6 (six) hours as needed for wheezing or shortness of breath.  . budesonide-formoterol (SYMBICORT) 160-4.5 MCG/ACT inhaler Inhale 2 puffs into the lungs 2 (two) times daily.  Marland Kitchen. guaiFENesin (MUCINEX) 600 MG 12 hr tablet Take 600 mg by mouth every 12 (twelve) hours as needed (w/ flutter valve).   . Multiple Vitamin (MULTIVITAMIN WITH MINERALS) TABS tablet Take 1 tablet by mouth 3 (three) times a week.  . OXYGEN-HELIUM IN Inhale 2 L into the lungs at bedtime.   . Rivaroxaban 15 & 20 MG TBPK 1 tablet by Per post-pyloric tube route 2 (two) times daily. Take as directed on package: Start with one 15mg  tablet by mouth twice a day with food. On Day 22, switch to one 20mg  tablet once a day with food.  . sodium chloride (OCEAN) 0.65 % SOLN nasal spray Place 1 spray into both nostrils 4 (four) times daily as needed for congestion.  Marland Kitchen. SPIRIVA HANDIHALER 18 MCG inhalation capsule INHALE CONTENTS OF 1 CAPSULE VIA HANDIHALER EVERY DAY  . triamterene-hydrochlorothiazide (MAXZIDE-25) 37.5-25 MG tablet TAKE 1 TABLET BY MOUTH DAILY   No current facility-administered medications on file prior to visit.      Allergies  Allergen Reactions  . Naproxen Sodium Itching    Review Of Systems:  Constitutional:   No  weight loss, night sweats,  Fevers, chills, +fatigue, or  lassitude.  HEENT:   No headaches,   Difficulty swallowing,  Tooth/dental problems, or  Sore throat,                No sneezing, itching, ear ache, nasal congestion, post nasal drip, bite marks to tongue.  CV:  No chest pain,  Orthopnea, PND, swelling in lower extremities, anasarca, dizziness, palpitations, syncope.   GI  No heartburn, indigestion, abdominal pain, nausea, vomiting, diarrhea, change in bowel habits, loss of appetite, bloody stools.   Resp: + shortness of breath with exertion less at rest.  No excess mucus, + productive cough ( baseline),  No non-productive cough,  No coughing up of blood.  No change in color of mucus.  + wheezing.  No chest wall deformity  Skin: no rash or lesions.  GU: no dysuria, change in color of urine, no urgency or frequency.  No flank pain, no hematuria   MS:  No joint pain or swelling.  No decreased range of motion.  Slight  back pain.  Psych:  No change in mood or affect. No depression or anxiety.  No memory loss.   Vital Signs BP 112/82 (BP Location: Left Arm, Cuff Size: Normal)   Pulse 89   Ht 6\' 4"  (1.93 m)   Wt 217 lb (98.4 kg)   SpO2 96%   BMI 26.41 kg/m    Physical Exam:  General- No distress,  A&Ox3, pleasant ENT: No sinus tenderness, TM clear, pale nasal mucosa, no oral exudate,no post nasal drip, no LAN Cardiac: S1, S2, regular rate and rhythm, no murmur Chest: + wheeze lower lobes/ no rales/ dullness; no accessory muscle use, no nasal flaring, no sternal retractions Abd.: Soft Non-tender, pendulous Ext: No clubbing cyanosis, edema Neuro: Moving all extremities 4 alert and oriented 3 appropriate, deconditioned Skin: No rashes, warm and dry Psych: normal mood and behavior   Assessment/Plan  COPD GOLD IV  Resolving exacerbation caused by PE Leukocytosis suspected secondary to prednisone treatment Afebrile, no purulent secretions. Plan: Continue your Symbicort daily as you have been doing. Continue your Spiriva daily as you have been doing. Rinse mouth  after use. Continue your prednisone 10 mg daily.  Wean back to your 5 mg as able. We will refill you proventil rescue inhaler Follow up with Theodore [name] 01/11/2017 as is scheduled. Please contact office for sooner follow up if symptoms do not improve or worsen or seek emergency care     Acute pulmonary embolism (HCC) Second PE 12/03/2016 off Xaralto since 07/2017 Submassive with early pulmonary infarction Some R heart strain Troponins Negative Echo EF 60-65% Plan: We will check a CBC today. We will renew your prescription for Xaralto 20 mg once daily with food , with refills for 6 months. Take this medication without fail. Bleeding precautions while on Xaralto. Care with kitchen prep and shaving. Compression to any bleeding. If it does not stop, seek emergency care. Will need follow up echo in 5-6 months. Follow up with Theodore [name] 01/11/2017 as is scheduled. Please contact office for sooner follow up if symptoms do not improve or worsen or seek emergency care     Acute respiratory failure Wear oxygen at 2 L [place] at night as ordered Maintain saturations 88-92%  Chronic anticoagulation Second pulmonary embolism after discontinuation of Xarelto September 2017 Will require lifelong anticoagulation Plan We will renew your prescription for Xaralto 20 mg once daily with food , with refills for 6 months. Bleeding precautions while on Xaralto. Care with kitchen prep and shaving. Compression to any bleeding. If it does not stop, seek emergency care. Hemoglobin stable after initiation of Xarelto treatment. Follow up with Theodore [name] 01/11/2017 as is scheduled. Please contact office for sooner follow up if symptoms do not improve or worsen or seek emergency care        Bevelyn Ngo, NP 12/23/2016  7:26 PM

## 2016-12-24 NOTE — Progress Notes (Signed)
Reviewed, agree 

## 2016-12-25 ENCOUNTER — Emergency Department (HOSPITAL_COMMUNITY)
Admission: EM | Admit: 2016-12-25 | Discharge: 2016-12-25 | Disposition: A | Discharge status: Home / Self Care | Payer: Medicare Other | Attending: Emergency Medicine | Admitting: Emergency Medicine

## 2016-12-25 ENCOUNTER — Emergency Department (HOSPITAL_COMMUNITY): Payer: Medicare Other

## 2016-12-25 ENCOUNTER — Encounter (HOSPITAL_COMMUNITY): Payer: Self-pay | Admitting: Emergency Medicine

## 2016-12-25 DIAGNOSIS — J449 Chronic obstructive pulmonary disease, unspecified: Secondary | ICD-10-CM | POA: Diagnosis not present

## 2016-12-25 DIAGNOSIS — M545 Low back pain: Secondary | ICD-10-CM | POA: Diagnosis present

## 2016-12-25 DIAGNOSIS — E1169 Type 2 diabetes mellitus with other specified complication: Secondary | ICD-10-CM | POA: Insufficient documentation

## 2016-12-25 DIAGNOSIS — M62830 Muscle spasm of back: Secondary | ICD-10-CM | POA: Insufficient documentation

## 2016-12-25 DIAGNOSIS — R05 Cough: Secondary | ICD-10-CM | POA: Diagnosis not present

## 2016-12-25 DIAGNOSIS — R079 Chest pain, unspecified: Secondary | ICD-10-CM | POA: Diagnosis not present

## 2016-12-25 DIAGNOSIS — I252 Old myocardial infarction: Secondary | ICD-10-CM | POA: Diagnosis not present

## 2016-12-25 DIAGNOSIS — Z7901 Long term (current) use of anticoagulants: Secondary | ICD-10-CM | POA: Insufficient documentation

## 2016-12-25 DIAGNOSIS — I2510 Atherosclerotic heart disease of native coronary artery without angina pectoris: Secondary | ICD-10-CM | POA: Diagnosis not present

## 2016-12-25 DIAGNOSIS — M5489 Other dorsalgia: Secondary | ICD-10-CM | POA: Diagnosis not present

## 2016-12-25 DIAGNOSIS — Z79899 Other long term (current) drug therapy: Secondary | ICD-10-CM | POA: Insufficient documentation

## 2016-12-25 DIAGNOSIS — Z87891 Personal history of nicotine dependence: Secondary | ICD-10-CM | POA: Insufficient documentation

## 2016-12-25 DIAGNOSIS — R52 Pain, unspecified: Secondary | ICD-10-CM | POA: Diagnosis not present

## 2016-12-25 LAB — BASIC METABOLIC PANEL
Anion gap: 12 (ref 5–15)
BUN: 11 mg/dL (ref 6–20)
CHLORIDE: 94 mmol/L — AB (ref 101–111)
CO2: 30 mmol/L (ref 22–32)
CREATININE: 0.99 mg/dL (ref 0.61–1.24)
Calcium: 9.3 mg/dL (ref 8.9–10.3)
GFR calc Af Amer: >60 mL/min (ref >60)
GFR calc non Af Amer: >60 mL/min (ref >60)
GLUCOSE: 92 mg/dL (ref 65–99)
Potassium: 3.9 mmol/L (ref 3.5–5.1)
SODIUM: 136 mmol/L (ref 135–145)

## 2016-12-25 LAB — CBC
HCT: 41.1 % (ref 39.0–52.0)
Hemoglobin: 13.8 g/dL (ref 13.0–17.0)
MCH: 31.4 pg (ref 26.0–34.0)
MCHC: 33.6 g/dL (ref 30.0–36.0)
MCV: 93.6 fL (ref 78.0–100.0)
PLATELETS: 319 10*3/uL (ref 150–400)
RBC: 4.39 MIL/uL (ref 4.22–5.81)
RDW: 14.5 % (ref 11.5–15.5)
WBC: 11.7 10*3/uL — ABNORMAL HIGH (ref 4.0–10.5)

## 2016-12-25 LAB — I-STAT TROPONIN, ED: Troponin i, poc: 0.01 ng/mL (ref 0.00–0.08)

## 2016-12-25 MED ORDER — FENTANYL CITRATE (PF) 100 MCG/2ML IJ SOLN
50.0000 ug | Freq: Once | INTRAMUSCULAR | Status: AC
  Administered 2016-12-25: 50 ug via INTRAVENOUS
  Filled 2016-12-25: qty 2

## 2016-12-25 MED ORDER — BACLOFEN 10 MG PO TABS: 10.0000 mg | ORAL_TABLET | Freq: Three times a day (TID) | ORAL | 0 refills | Status: DC

## 2016-12-25 MED ORDER — HYDROCODONE-ACETAMINOPHEN 5-325 MG PO TABS
1.0000 | ORAL_TABLET | Freq: Four times a day (QID) | ORAL | 0 refills | Status: DC | PRN
Start: 2016-12-25 — End: 2016-12-28

## 2016-12-25 MED ORDER — ONDANSETRON HCL 4 MG/2ML IJ SOLN
4.0000 mg | Freq: Once | INTRAMUSCULAR | Status: AC
  Administered 2016-12-25: 4 mg via INTRAVENOUS
  Filled 2016-12-25: qty 2

## 2016-12-25 MED ORDER — BACLOFEN 10 MG PO TABS
10.0000 mg | ORAL_TABLET | Freq: Once | ORAL | Status: DC | PRN
  Filled 2016-12-25: qty 1

## 2016-12-25 NOTE — ED Provider Notes (Addendum)
[facility]-EMERGENCY DEPT Provider Note   CSN: [id number] Arrival date & time: 12/25/16  [id number]     History   Chief Complaint Chief Complaint  Patient presents with  . Back Pain    left thorax     HPI Theodore [name] is a 61 y.o. male  has a past medical history of Anxiety; Arthritis; Asthma; Chronic bronchitis (HCC); Cluster headache; COPD (chronic obstructive pulmonary disease) (HCC); GERD (gastroesophageal reflux disease); NSTEMI (non-ST elevated myocardial infarction) (HCC) (01/2014); On home oxygen therapy; Pneumonia (06/2013; 01/2014); Pulmonary embolism (HCC) (01/23/2014); and Seasonal allergies.  The patient presents to the ED with Acute R upper thoracic pain. It woke him from sleep this morning at 0400. Pain is exacerbated by coughing, and exertion. He denies pleuritic pain and states that he had pain with inspiration with his previous PE but does not have it with this pain. Patient does not smoke. He is compliant with his medications including xarelto. He Denies hemoptysis and states that he feels it could be musculoskeletal. He feels improved when lying very still. He denies any changes in his cough. He denies any fever. He feels his his breathing is at baseline.  He felt a pop in his back while coughing this morning. He denies urinary sxs.      HPI  Past Medical History:  Diagnosis Date  . Anxiety   . Arthritis    "back" (01/24/2014)  . Asthma   . Chronic bronchitis (HCC)   . Cluster headache    "last bout was summer 2014; had them q spring 1991-2001" (01/24/2014)  . COPD (chronic obstructive pulmonary disease) (HCC)   . GERD (gastroesophageal reflux disease)   . NSTEMI (non-ST elevated myocardial infarction) (HCC) 01/2014  . On home oxygen therapy    "2L; 24/7" (01/24/2014)  . Pneumonia 06/2013; 01/2014  . Pulmonary embolism (HCC) 01/23/2014  . Seasonal allergies     Patient Active Problem List   Diagnosis Date Noted  . Chronic anticoagulation 12/23/2016  . DOE (dyspnea on  exertion) 12/03/2016  . Chest pain 12/03/2016  . Diabetes mellitus with complication (HCC)   . Pulmonary embolism on right (HCC)   . Anxiety state 10/30/2015  . Routine general medical examination at a health care facility 09/03/2014  . Back pain 09/03/2014  . Nausea without vomiting 08/22/2014  . HBP (high blood pressure) 01/29/2014  . Acute pulmonary embolism (HCC) 01/23/2014  . Pulmonary embolism (HCC) 01/23/2014  . CAD (coronary artery disease) 01/23/2014  . Acute respiratory failure (HCC) 01/23/2014  . HCAP (healthcare-associated pneumonia) 01/20/2014  . Chronic respiratory failure assoc with cor pulmonale 01/20/2014  . Acute cor pulmonale (HCC) 01/12/2014  . NSTEMI (non-ST elevated myocardial infarction) (HCC) 01/11/2014  . DM (diabetes mellitus) (HCC) 01/10/2014  . Multiple lung nodules 10/24/2013  . PNA (pneumonia) 06/25/2013  . COPD exacerbation (HCC) 06/13/2013  . Smoking 03/03/2012  . COPD GOLD IV  09/28/2011    Past Surgical History:  Procedure Laterality Date  . COLONOSCOPY WITH PROPOFOL N/A 10/20/2016   Procedure: COLONOSCOPY WITH PROPOFOL;  Surgeon: Sherrilyn RistHenry L Danis III, MD;  Location: [facility];  Service: Gastroenterology;  Laterality: N/A;  . NASAL SEPTOPLASTY W/ TURBINOPLASTY Left 1983  . TURBINATE REDUCTION Bilateral ~ 1983       Home Medications    Prior to Admission medications   Medication Sig Start Date End Date Taking? Authorizing Provider  albuterol (PROVENTIL) (2.5 MG/3ML) 0.083% nebulizer solution Take 2.5 mg by nebulization every 6 (six) hours as needed for wheezing  or shortness of breath.    Historical Provider, MD  albuterol (VENTOLIN HFA) 108 (90 Base) MCG/ACT inhaler INHALE 2 PUFFS INTO THE LUNGS EVERY 4 HOURS AS NEEDED FOR SHORTNESS OF BREATH OR WHEEZING 12/23/16   Bevelyn Ngo, NP  budesonide-formoterol [facility]) 160-4.5 MCG/ACT inhaler Inhale 2 puffs into the lungs 2 (two) times daily. 07/21/16   Lupita Leash, MD  guaiFENesin  (MUCINEX) 600 MG 12 hr tablet Take 600 mg by mouth every 12 (twelve) hours as needed (w/ flutter valve).     Historical Provider, MD  Multiple Vitamin (MULTIVITAMIN WITH MINERALS) TABS tablet Take 1 tablet by mouth 3 (three) times a week.    Historical Provider, MD  OXYGEN-HELIUM IN Inhale 2 L into the lungs at bedtime.     Historical Provider, MD  predniSONE (DELTASONE) 10 MG tablet Take 10 mg by mouth daily with breakfast.    Historical Provider, MD  rivaroxaban (XARELTO) 20 MG TABS tablet Take 1 tablet (20 mg total) by mouth daily with supper. 12/23/16   Bevelyn Ngo, NP  Rivaroxaban 15 & 20 MG TBPK 1 tablet by Per post-pyloric tube route 2 (two) times daily. Take as directed on package: Start with one 15mg  tablet by mouth twice a day with food. On Day 22, switch to one 20mg  tablet once a day with food. 12/03/16   Raeford Razor, MD  sodium chloride (OCEAN) 0.65 % SOLN nasal spray Place 1 spray into both nostrils 4 (four) times daily as needed for congestion.    Historical Provider, MD  SPIRIVA HANDIHALER 18 MCG inhalation capsule INHALE CONTENTS OF 1 CAPSULE VIA HANDIHALER EVERY DAY 08/13/16   Lupita Leash, MD  triamterene-hydrochlorothiazide [facility]) 37.5-25 MG tablet TAKE 1 TABLET BY MOUTH DAILY 11/30/16   [name], FNP    Family History Family History  Problem Relation Age of Onset  . Leukemia Father   . Heart disease Father   . Asthma Father   . Asthma Sister   . Emphysema Sister   . Heart disease Sister   . High blood pressure Mother   . Colon cancer Cousin     First cousin    Social History Social History  Substance Use Topics  . Smoking status: Former Smoker    Packs/day: 2.00    Years: 35.00    Types: Cigarettes    Quit date: 01/24/2012  . Smokeless tobacco: Never Used     Comment: Remain smoke free  . Alcohol use Yes     Comment: 01/24/2014 "holidays; family birthdays"     Allergies   Naproxen sodium   Review of Systems Review of Systems Ten  systems reviewed and are negative for acute change, except as noted in the HPI.   Physical Exam Updated Vital Signs BP 147/89   Pulse 76   Temp 97.7 F (36.5 C) (Oral)   Resp 13   Ht 6\' 4"  (1.93 m)   Wt 98.4 kg   SpO2 100%   BMI 26.41 kg/m   Physical Exam  Constitutional: He appears well-developed and well-nourished. No distress.  HENT:  Head: Normocephalic and atraumatic.  Eyes: Conjunctivae are normal. No scleral icterus.  Neck: Normal range of motion. Neck supple.  Cardiovascular: Normal rate, regular rhythm and normal heart sounds.   Pulmonary/Chest: Effort normal and breath sounds normal. No respiratory distress.  Abdominal: Soft. There is no tenderness.  Musculoskeletal: He exhibits no edema.       Arms: Neurological: He is alert.  Skin:  Skin is warm and dry. He is not diaphoretic.  Psychiatric: His behavior is normal.  Nursing note and vitals reviewed.    ED Treatments / Results  Labs (all labs ordered are listed, but only abnormal results are displayed) Labs Reviewed  BASIC METABOLIC PANEL - Abnormal; Notable for the following:       Result Value   Chloride 94 (*)    All other components within normal limits  CBC - Abnormal; Notable for the following:    WBC 11.7 (*)    All other components within normal limits  I-STAT TROPOININ, ED    EKG  EKG Interpretation  Date/Time:  Friday December 25 2016 05:29:38 EST Ventricular Rate:  72 PR Interval:    QRS Duration: 110 QT Interval:  421 QTC Calculation: 461 R Axis:   86 Text Interpretation:  Sinus rhythm Interpretation limited secondary to artifact Borderline right axis deviation Confirmed by Bebe Shaggy  MD, DONALD ([id number]) on 12/25/2016 5:36:32 AM       Radiology Dg Chest 2 View  Result Date: 12/25/2016 CLINICAL DATA:  Left thoracic chest pain increasing with cough and movement. Previous history of pulmonary embolus. EXAM: CHEST  2 VIEW COMPARISON:  CT chest 12/03/2016.  Chest 12/02/2016. FINDINGS:  Emphysematous changes and scattered fibrosis in the lungs. No focal airspace disease or consolidation. No blunting of costophrenic angles. No pneumothorax. Mediastinal contours appear intact. Normal heart size and pulmonary vascularity. Mild degenerative changes in the spine. IMPRESSION: Emphysematous changes in the lungs. No evidence of active pulmonary disease. Electronically Signed   By: Burman Nieves M.D.   On: 12/25/2016 05:55    Procedures Procedures (including critical care time)  Medications Ordered in ED Medications - No data to display   Initial Impression / Assessment and Plan / ED Course  I have reviewed the triage vital signs and the nursing notes.  Pertinent labs & imaging results that were available during my care of the patient were reviewed by me and considered in my medical decision making (see chart for details).     The patient labs, chest x-ray and EKG reviewed. No concerning findings. Patient is compliant with his relatives. He is on treatment for his very embolus. His pain is reproducible with palpation of the midthoracic region. Patient was seen in shared visit with Dr. Melene Plan, who acknowledges this reproducible pain. I feel this is musculoskeletal back exam. Patient will be discharged with baclofen and pain medications. I discussed reasons to seek immediate medical care. He appears safe for discharge at this time.  Final Clinical Impressions(s) / ED Diagnoses   Final diagnoses:  Muscle spasm of back    New Prescriptions New Prescriptions   No medications on file     Arthor Captain, PA-C 12/25/16 1914    Melene Plan, DO 12/25/16 [id number]    Arthor Captain, PA-C 01/06/17 [id number]    Melene Plan, DO 01/08/17 1601

## 2016-12-25 NOTE — ED Notes (Signed)
Patient transported to X-ray 

## 2016-12-25 NOTE — ED Triage Notes (Signed)
Pt brought to ED by [facility] from home for c/o 8/10 left thoracic pain increases with cough and movement, prior hx of same with massive PE complicated with MI. Pt O2 dependent for sleep 96% on RA. Afib on monitor with frequently PVC.

## 2016-12-25 NOTE — Discharge Instructions (Signed)
HOME CARE INSTRUCTIONS  Stay well hydrated. Drink enough water and fluids to keep your urine clear or pale yellow. It may be helpful to massage, stretch, and relax the affected muscle. For tight or tense muscles, use a warm towel, heating pad, or hot shower water directed to the affected area. If you are sore or have pain after a cramp or spasm, applying ice to the affected area may relieve discomfort. Put ice in a plastic bag. Place a towel between your skin and the bag. Leave the ice on for 15-20 minutes, 3-4 times a day. Medicines used to treat a known cause of cramps or spasms may help reduce their frequency or severity. Only take over-the-counter or prescription medicines as directed by your caregiver. SEEK MEDICAL CARE IF:  Your cramps or spasms get more severe, more frequent, or do not improve over time.

## 2016-12-27 NOTE — Progress Notes (Signed)
Subjective:    Patient ID: Theodore [name], male    DOB: 1956/03/02, 61 y.o.   MRN: [id number]  Chief Complaint  Patient presents with  . Back Pain    follow up    HPI:  Theodore [name] is a 61 y.o. male who  has a past medical history of Anxiety; Arthritis; Asthma; Chronic bronchitis (HCC); Cluster headache; COPD (chronic obstructive pulmonary disease) (HCC); GERD (gastroesophageal reflux disease); NSTEMI (non-ST elevated myocardial infarction) (HCC) (01/2014); On home oxygen therapy; Pneumonia (06/2013; 01/2014); Pulmonary embolism (HCC) (01/23/2014); and Seasonal allergies. and presents today for a follow up office visit.   This is a new problem. Associated symptoms of acute right upper thoracic pain starting hours before his presentation to the emergency department exacerbated by coughing and exertion. Denied pleuritic chest pain. Compliant with his Xarelto from previous pulmonary embolism. Symptoms are improved when lying still. EKG revealed sinus rhythm. Chest x-ray showed emphysema changes in the lungs with no evidence of acute pulmonary disease. Given symptoms believed to be muscle skeletal in nature advised to follow-up with primary care. He was given baclofen and hydrocodone-acetaminophen. All ED records, labs, and imaging reviewed in detail.  Since leaving the emergency department he reports that he has had some improvements, however continues to experience pain located in his thoracic spine. Pain is described as sharp when he coughs and there is also some achiness. Modifying factors include splinting which does help with his symptoms. Reports taking the baclofen and hydrocodone as prescribed and denies adverse side effects or constipation. Overall course of the symptoms appear stable with some little improvements. Does continue to cough. No numbness or tingling. He also notes bilateral lower extremity edema that has been going on for several weeks. Not following a low-sodium diet  currently. Modifying factors include triamterene-hydrochlorothiazide which she reports taking as prescribed and denies adverse side effects. This has not helped greatly with his symptoms. Denies elevation of legs. Symptoms are currently positive for chest wall pain with no cardiac related chest pain. Continues to have shortness of breath  Allergies  Allergen Reactions  . Naproxen Sodium Itching      Outpatient Medications Prior to Visit  Medication Sig Dispense Refill  . albuterol (PROVENTIL) (2.5 MG/3ML) 0.083% nebulizer solution Take 2.5 mg by nebulization every 6 (six) hours as needed for wheezing or shortness of breath.    Marland Kitchen. albuterol (VENTOLIN HFA) 108 (90 Base) MCG/ACT inhaler INHALE 2 PUFFS INTO THE LUNGS EVERY 4 HOURS AS NEEDED FOR SHORTNESS OF BREATH OR WHEEZING 18 g 3  . baclofen (LIORESAL) 10 MG tablet Take 1 tablet (10 mg total) by mouth 3 (three) times daily. 30 each 0  . budesonide-formoterol (SYMBICORT) 160-4.5 MCG/ACT inhaler Inhale 2 puffs into the lungs 2 (two) times daily. 1 Inhaler 0  . guaiFENesin (MUCINEX) 600 MG 12 hr tablet Take 1,200 mg by mouth 2 (two) times daily.     . Multiple Vitamin (MULTIVITAMIN WITH MINERALS) TABS tablet Take 1 tablet by mouth 3 (three) times a week.    . nicotine polacrilex (NICORETTE) 4 MG gum Take 4 mg by mouth daily as needed for smoking cessation.    . OXYGEN-HELIUM IN Inhale 2 L into the lungs at bedtime.     . predniSONE (DELTASONE) 10 MG tablet Take 10 mg by mouth daily with breakfast.    . rivaroxaban (XARELTO) 20 MG TABS tablet Take 1 tablet (20 mg total) by mouth daily with supper. 30 tablet 6  . Rivaroxaban 15 &  20 MG TBPK 1 tablet by Per post-pyloric tube route 2 (two) times daily. Take as directed on package: Start with one 15mg  tablet by mouth twice a day with food. On Day 22, switch to one 20mg  tablet once a day with food. 51 each 0  . sodium chloride (OCEAN) 0.65 % SOLN nasal spray Place 1 spray into both nostrils 4 (four) times  daily as needed for congestion.    Marland Kitchen SPIRIVA HANDIHALER 18 MCG inhalation capsule INHALE CONTENTS OF 1 CAPSULE VIA HANDIHALER EVERY DAY 30 capsule 5  . triamterene-hydrochlorothiazide (MAXZIDE-25) 37.5-25 MG tablet TAKE 1 TABLET BY MOUTH DAILY 30 tablet 0  . HYDROcodone-acetaminophen (NORCO) 5-325 MG tablet Take 1-2 tablets by mouth every 6 (six) hours as needed for moderate pain. 20 tablet 0   No facility-administered medications prior to visit.       Past Surgical History:  Procedure Laterality Date  . COLONOSCOPY WITH PROPOFOL N/A 10/20/2016   Procedure: COLONOSCOPY WITH PROPOFOL;  Surgeon: Sherrilyn Rist, MD;  Location: [facility];  Service: Gastroenterology;  Laterality: N/A;  . NASAL SEPTOPLASTY W/ TURBINOPLASTY Left 1983  . TURBINATE REDUCTION Bilateral ~ 1983      Past Medical History:  Diagnosis Date  . Anxiety   . Arthritis    "back" (01/24/2014)  . Asthma   . Chronic bronchitis (HCC)   . Cluster headache    "last bout was summer 2014; had them q spring 1991-2001" (01/24/2014)  . COPD (chronic obstructive pulmonary disease) (HCC)   . GERD (gastroesophageal reflux disease)   . NSTEMI (non-ST elevated myocardial infarction) (HCC) 01/2014  . On home oxygen therapy    "2L; 24/7" (01/24/2014)  . Pneumonia 06/2013; 01/2014  . Pulmonary embolism (HCC) 01/23/2014  . Seasonal allergies       Review of Systems  Constitutional: Negative for chills, diaphoresis, fever and unexpected weight change.  Respiratory: Positive for cough and shortness of breath. Negative for chest tightness.   Cardiovascular: Positive for chest pain and leg swelling. Negative for palpitations.  Musculoskeletal:       Positive for chest wall pain  Neurological: Negative for weakness and numbness.      Objective:    BP 132/82   Pulse 92   Temp 97.8 F (36.6 C)   Ht 6\' 4"  (1.93 m)   Wt 221 lb (100.2 kg)   BMI 26.90 kg/m  Nursing note and vital signs reviewed.  Physical Exam    Constitutional: He is oriented to person, place, and time. He appears well-developed and well-nourished. No distress.  Cardiovascular: Normal rate, regular rhythm, normal heart sounds and intact distal pulses.   Bilateral lower extremities with moderate 1+ pitting edema. Pulses are 1+. Sensation intact and appropriate.  Pulmonary/Chest: Effort normal and breath sounds normal.  Right chest wall - no obvious deformity, discoloration, or edema. Thoracic paraspinal musculature within normal limits and nontender. There is generalized tenderness over the midthoracic ribs with positive compression test. Range of motion limited secondary to pain and discomfort.  Neurological: He is alert and oriented to person, place, and time.  Skin: Skin is warm and dry.  Psychiatric: He has a normal mood and affect. His behavior is normal. Judgment and thought content normal.       Assessment & Plan:   Problem List Items Addressed This Visit      Other   Costochondral chest pain    New-onset costochondral chest pain located on the right midthoracic ribs most likely related from coughing.  Workup for pulmonary embolism negative in the emergency department and maintained on his are alto for anticoagulation with no adverse side effects. Obtain rib x-rays to rule out fracture. Treat conservatively with ice/moist heat, deep breaths/coughing multiple times throughout per hour, continue Tylenol on scheduled basis and hydrocodone as needed for breakthrough pain. Abdominal binder as necessary for discomfort. Follow-up pending x-ray results and if symptoms worsen or do not improve.      Relevant Medications   HYDROcodone-acetaminophen (NORCO) 5-325 MG tablet   Other Relevant Orders   DG Ribs Unilateral Right (Completed)   Bilateral lower extremity edema    Bilateral lower extremity edema most likely associated with respiratory cor pulmonale. Increase triamterene-hydrochlorothiazide 4 days. Encouraged to elevate feet when  seated, decrease sodium in diet, and compression sleeves as necessary. Follow-up if symptoms worsen or do not improve.          I have changed Mr. Rayford HYDROcodone-acetaminophen. I am also having him maintain his guaiFENesin, OXYGEN-HELIUM IN, albuterol, budesonide-formoterol, SPIRIVA HANDIHALER, multivitamin with minerals, sodium chloride, triamterene-hydrochlorothiazide, Rivaroxaban, predniSONE, albuterol, rivaroxaban, nicotine polacrilex, and baclofen.   Meds ordered this encounter  Medications  . HYDROcodone-acetaminophen (NORCO) 5-325 MG tablet    Sig: Take 1 tablet by mouth every 6 (six) hours as needed for moderate pain.    Dispense:  20 tablet    Refill:  0    Order Specific Question:   Supervising Provider    Answer:   Hillard Danker A [[id number]]     Follow-up: Return if symptoms worsen or fail to improve.  Jeanine Luz, FNP

## 2016-12-28 ENCOUNTER — Ambulatory Visit (INDEPENDENT_AMBULATORY_CARE_PROVIDER_SITE_OTHER): Payer: Medicare Other | Admitting: Family

## 2016-12-28 ENCOUNTER — Ambulatory Visit (INDEPENDENT_AMBULATORY_CARE_PROVIDER_SITE_OTHER)
Admission: RE | Admit: 2016-12-28 | Discharge: 2016-12-28 | Disposition: A | Discharge status: Home / Self Care | Payer: Medicare Other | Source: Ambulatory Visit | Attending: Family | Admitting: Family

## 2016-12-28 ENCOUNTER — Other Ambulatory Visit: Payer: Self-pay | Admitting: Family

## 2016-12-28 ENCOUNTER — Encounter: Payer: Self-pay | Admitting: Family

## 2016-12-28 DIAGNOSIS — R0789 Other chest pain: Secondary | ICD-10-CM

## 2016-12-28 DIAGNOSIS — R0781 Pleurodynia: Secondary | ICD-10-CM | POA: Diagnosis not present

## 2016-12-28 DIAGNOSIS — R600 Localized edema: Secondary | ICD-10-CM | POA: Diagnosis not present

## 2016-12-28 DIAGNOSIS — R071 Chest pain on breathing: Secondary | ICD-10-CM | POA: Diagnosis not present

## 2016-12-28 MED ORDER — HYDROCODONE-ACETAMINOPHEN 5-325 MG PO TABS: 1.0000 | ORAL_TABLET | Freq: Four times a day (QID) | ORAL | 0 refills | Status: DC | PRN

## 2016-12-28 NOTE — Progress Notes (Signed)
Pre visit review using our clinic review tool, if applicable. No additional management support is needed unless otherwise documented below in the visit note. 

## 2016-12-28 NOTE — Assessment & Plan Note (Addendum)
New-onset costochondral chest pain located on the right midthoracic ribs most likely related from coughing. Workup for pulmonary embolism negative in the emergency department and maintained on his are alto for anticoagulation with no adverse side effects. Obtain rib x-rays to rule out fracture. Treat conservatively with ice/moist heat, deep breaths/coughing multiple times throughout per hour, continue Tylenol on scheduled basis and hydrocodone as needed for breakthrough pain. Abdominal binder as necessary for discomfort. Follow-up pending x-ray results and if symptoms worsen or do not improve.

## 2016-12-28 NOTE — Assessment & Plan Note (Signed)
Bilateral lower extremity edema most likely associated with respiratory cor pulmonale. Increase triamterene-hydrochlorothiazide 4 days. Encouraged to elevate feet when seated, decrease sodium in diet, and compression sleeves as necessary. Follow-up if symptoms worsen or do not improve.

## 2016-12-28 NOTE — Patient Instructions (Signed)
Thank you for choosing [facility].  SUMMARY AND INSTRUCTIONS:  Deep breathing and coughs multiple times per hour.  Abdominal binder as needed for discomfort. Splinting with cough and sneezing.  Elevate your legs when seated.  Decrease sodium in diet.  Compression socks as needed.   Medication:  Please take 2 of your triamterene-hydrochlorothiazide x 3 days.  Tylenol 1000 mg  3 times daily for 4 days then as needed.  Continue hydrocodone as needed for BREAKTHROUGH pain not controlled by Tylenol.   Your prescription(s) have been submitted to your pharmacy or been printed and provided for you. Please take as directed and contact our office if you believe you are having problem(s) with the medication(s) or have any questions.  Imaging / Radiology:  Please stop by radiology on the basement level of the building for your x-rays. Your results will be released to MyChart (or called to you) after review, usually within 72 hours after test completion. If any treatments or changes are necessary, you will be notified at that same time.  Follow up:  If your symptoms worsen or fail to improve, please contact our office for further instruction, or in case of emergency go directly to the emergency room at the closest medical facility.     Costochondritis Costochondritis is swelling and irritation (inflammation) of the tissue (cartilage) that connects your ribs to your breastbone (sternum). This causes pain in the front of your chest. Usually, the pain:  Starts gradually.  Is in more than one rib. This condition usually goes away on its own over time. Follow these instructions at home:  Do not do anything that makes your pain worse.  If directed, put ice on the painful area:  Put ice in a plastic bag.  Place a towel between your skin and the bag.  Leave the ice on for 20 minutes, 2-3 times a day.  If directed, put heat on the affected area as often as told by your doctor.  Use the heat source that your doctor tells you to use, such as a moist heat pack or a heating pad.  Place a towel between your skin and the heat source.  Leave the heat on for 20-30 minutes.  Take off the heat if your skin turns bright red. This is very important if you cannot feel pain, heat, or cold. You may have a greater risk of getting burned.  Take over-the-counter and prescription medicines only as told by your doctor.  Return to your normal activities as told by your doctor. Ask your doctor what activities are safe for you.  Keep all follow-up visits as told by your doctor. This is important. Contact a doctor if:  You have chills or a fever.  Your pain does not go away or it gets worse.  You have a cough that does not go away. Get help right away if:  You are short of breath. This information is not intended to replace advice given to you by your health care provider. Make sure you discuss any questions you have with your health care provider. Document Released: 04/13/2008 Document Revised: 05/15/2016 Document Reviewed: 02/19/2016 Elsevier Interactive Patient Education  2017 [employer]

## 2017-01-11 ENCOUNTER — Ambulatory Visit (INDEPENDENT_AMBULATORY_CARE_PROVIDER_SITE_OTHER): Payer: Medicare Other | Admitting: Pulmonary Disease

## 2017-01-11 ENCOUNTER — Encounter: Payer: Self-pay | Admitting: Pulmonary Disease

## 2017-01-11 VITALS — BP 98/60 | HR 104 | Ht 76.0 in | Wt 215.6 lb

## 2017-01-11 DIAGNOSIS — J9611 Chronic respiratory failure with hypoxia: Secondary | ICD-10-CM

## 2017-01-11 DIAGNOSIS — J449 Chronic obstructive pulmonary disease, unspecified: Secondary | ICD-10-CM | POA: Diagnosis not present

## 2017-01-11 DIAGNOSIS — F17200 Nicotine dependence, unspecified, uncomplicated: Secondary | ICD-10-CM | POA: Diagnosis not present

## 2017-01-11 DIAGNOSIS — R0600 Dyspnea, unspecified: Secondary | ICD-10-CM | POA: Diagnosis not present

## 2017-01-11 DIAGNOSIS — I2601 Septic pulmonary embolism with acute cor pulmonale: Secondary | ICD-10-CM | POA: Diagnosis not present

## 2017-01-11 NOTE — Patient Instructions (Signed)
Keep taking Symbicort and Spiriva as you are doing We will refer you to pulmonary rehabilitation Keep taking your Xarelto as you are doing We will see you back in 3 months or sooner if needed

## 2017-01-11 NOTE — Addendum Note (Signed)
Addended by: Velvet BatheAULFIELD, Calen Posch L on: 01/11/2017 02:33 PM   Modules accepted: Orders

## 2017-01-11 NOTE — Assessment & Plan Note (Signed)
He continues to struggle with symptoms from his severe COPD.  In the last few weeks he hasn't had exacerbations, but he had a bad PE lastly.   He remains prednisone dependent and has been unable to take less than 10mg  daily when he tried to decrease it at my direction.  Plan: Continue Symbicort and Spiriva as prescribed Pulmonary rehab referral for severe deconditioning

## 2017-01-11 NOTE — Progress Notes (Signed)
Subjective:    Patient ID: Theodore [name], male    DOB: 17-May-1956, 61 y.o.   MRN: [id number]  Synopsis: Has stage IV COPD (steroid dependent) and history of a pulmonary embolism after a hospitalization in 2015 formerly followed by Dr. Sherene SiresWert. Dr. Sherene SiresWert summarized his situation as follows: - PFT's 12/02/2011  FEV1  0.94 (24%) ratio 39 -  18% response to B 2 and DLCO 60%  -Alpha 1 genotype sent 03/01/12 >  MM  -07/14/2013 Med calendar , 10/24/2013  - self titration of steroids with ceiling of pred 20/ floor of zero rec 11/07/13 > did not follow action plan 12/11/2013 > repeated same 01/19/14  - 08/06/2014 p extensive coaching HFA effectiveness =    90%  - Smoked 3 packs a day at the most, decreased to 1 ppd for a "few years", he quit altogether since 2015 -second lifetime pulmomary embolism in January 2018  HPI Chief Complaint  Patient presents with  . Follow-up    Pt feels like his breathing has not being doing well, pt is having increase sob, chest tightness, still has a slight cough with white phlegn, Denies fever   Mr. Babs BertinJeffcoat had another pulmonary embolism after we stopped his blood thinner.  He notes that he was more sedentary for about 1-2 weeks just sitting on the couch when he thinks he formed the clot.  He ended up in the ER and was diagnosed with another PE.  He had a recent rib fracture after his PE.  He has a lot of dyspnea after walking 45-50 feet, though this has improved slightly.  He has been wheezing some and coughing a little more.  However even this has improved sightly.  He just notices more dyspnea on exertion.  He has been monitoring his oxygen saturation closely and says that his oxygen saturation has been as low as 78-79% on room air.    No recent cough, cold symptoms.  He had a dry scratchy cough a few weeks ago, this has subsided (he took a few extra prednisone during that time).  He notes chest congestion frequently.     He has not been smoking cigarettes.  Past  Medical History:  Diagnosis Date  . Anxiety   . Arthritis    "back" (01/24/2014)  . Asthma   . Chronic bronchitis (HCC)   . Cluster headache    "last bout was summer 2014; had them q spring 1991-2001" (01/24/2014)  . COPD (chronic obstructive pulmonary disease) (HCC)   . GERD (gastroesophageal reflux disease)   . NSTEMI (non-ST elevated myocardial infarction) (HCC) 01/2014  . On home oxygen therapy    "2L; 24/7" (01/24/2014)  . Pneumonia 06/2013; 01/2014  . Pulmonary embolism (HCC) 01/23/2014  . Seasonal allergies       Review of Systems  Constitutional: Positive for fatigue. Negative for chills and fever.  HENT: Negative for postnasal drip, rhinorrhea and sinus pressure.   Respiratory: Positive for chest tightness, shortness of breath and wheezing.   Cardiovascular: Positive for leg swelling. Negative for chest pain and palpitations.       Objective:   Physical Exam  Vitals:   01/11/17 1403  BP: 98/60  Pulse: (!) 104  SpO2: 97%  Weight: 215 lb 9.6 oz (97.8 kg)  Height: 6\' 4"  (1.93 m)   RA  O2 saturation measured while ambulating on room air, walked 125 feet O2 saturation went to 92%, no lower  Gen: chronically ill appearing HENT: OP clear, TM's clear,  neck supple PULM: Poor air movement, no wheezing today, normal percussion CV: RRR, no mgr, trace edema GI: BS+, soft, nontender Derm: no cyanosis or rash Psyche: normal mood and affect   August 2017 serum IgE normal, serum eosinophils normal.   Records from his visit with his primary care physician on February 19 reviewed. A chest x-ray ordered to evaluate for rib fracture.  February 19 chest x-ray independently reviewed showing a rib fracture on the right     Assessment & Plan:  COPD GOLD IV  He continues to struggle with symptoms from his severe COPD.  In the last few weeks he hasn't had exacerbations, but he had a bad PE lastly.   He remains prednisone dependent and has been unable to take less than 10mg   daily when he tried to decrease it at my direction.  Plan: Continue Symbicort and Spiriva as prescribed Pulmonary rehab referral for severe deconditioning   Smoking Tells me he quit smoking several years ago  Chronic respiratory failure assoc with cor pulmonale Continue treatment for COPD and PE. Consider repeat echo if no improvement in dyspnea after pulmonary rehab  Ambulatory oxygen saturation monitoring in clinic today  Pulmonary embolism (HCC) Continue life long Xarelto  Current Outpatient Prescriptions:  .  albuterol (PROVENTIL) (2.5 MG/3ML) 0.083% nebulizer solution, Take 2.5 mg by nebulization every 6 (six) hours as needed for wheezing or shortness of breath., Disp: , Rfl:  .  albuterol (VENTOLIN HFA) 108 (90 Base) MCG/ACT inhaler, INHALE 2 PUFFS INTO THE LUNGS EVERY 4 HOURS AS NEEDED FOR SHORTNESS OF BREATH OR WHEEZING, Disp: 18 g, Rfl: 3 .  budesonide-formoterol (SYMBICORT) 160-4.5 MCG/ACT inhaler, Inhale 2 puffs into the lungs 2 (two) times daily., Disp: 1 Inhaler, Rfl: 0 .  guaiFENesin (MUCINEX) 600 MG 12 hr tablet, Take 1,200 mg by mouth 2 (two) times daily. , Disp: , Rfl:  .  Multiple Vitamin (MULTIVITAMIN WITH MINERALS) TABS tablet, Take 1 tablet by mouth 3 (three) times a week., Disp: , Rfl:  .  nicotine polacrilex (NICORETTE) 4 MG gum, Take 4 mg by mouth daily as needed for smoking cessation., Disp: , Rfl:  .  OXYGEN-HELIUM IN, Inhale 2 L into the lungs at bedtime. , Disp: , Rfl:  .  predniSONE (DELTASONE) 10 MG tablet, Take 10 mg by mouth daily with breakfast., Disp: , Rfl:  .  rivaroxaban (XARELTO) 20 MG TABS tablet, Take 1 tablet (20 mg total) by mouth daily with supper., Disp: 30 tablet, Rfl: 6 .  Rivaroxaban 15 & 20 MG TBPK, 1 tablet by Per post-pyloric tube route 2 (two) times daily. Take as directed on package: Start with one 15mg  tablet by mouth twice a day with food. On Day 22, switch to one 20mg  tablet once a day with food., Disp: 51 each, Rfl: 0 .  sodium  chloride (OCEAN) 0.65 % SOLN nasal spray, Place 1 spray into both nostrils 4 (four) times daily as needed for congestion., Disp: , Rfl:  .  SPIRIVA HANDIHALER 18 MCG inhalation capsule, INHALE CONTENTS OF 1 CAPSULE VIA HANDIHALER EVERY DAY, Disp: 30 capsule, Rfl: 5 .  triamterene-hydrochlorothiazide (MAXZIDE-25) 37.5-25 MG tablet, TAKE 1 TABLET BY MOUTH DAILY, Disp: 30 tablet, Rfl: 5 .  baclofen (LIORESAL) 10 MG tablet, Take 1 tablet (10 mg total) by mouth 3 (three) times daily. (Patient not taking: Reported on 01/11/2017), Disp: 30 each, Rfl: 0 .  HYDROcodone-acetaminophen (NORCO) 5-325 MG tablet, Take 1 tablet by mouth every 6 (six) hours as needed for  moderate pain. (Patient not taking: Reported on 01/11/2017), Disp: 20 tablet, Rfl: 0

## 2017-01-11 NOTE — Assessment & Plan Note (Addendum)
Continue treatment for COPD and PE. Consider repeat echo if no improvement in dyspnea after pulmonary rehab  Ambulatory oxygen saturation monitoring in clinic today

## 2017-01-11 NOTE — Assessment & Plan Note (Signed)
Tells me he quit smoking several years ago

## 2017-01-11 NOTE — Assessment & Plan Note (Signed)
Continue lifelong Xarelto 

## 2017-01-22 ENCOUNTER — Ambulatory Visit (INDEPENDENT_AMBULATORY_CARE_PROVIDER_SITE_OTHER): Payer: Medicare Other | Admitting: Pulmonary Disease

## 2017-01-22 DIAGNOSIS — J449 Chronic obstructive pulmonary disease, unspecified: Secondary | ICD-10-CM

## 2017-01-22 DIAGNOSIS — R0600 Dyspnea, unspecified: Secondary | ICD-10-CM

## 2017-01-22 LAB — PULMONARY FUNCTION TEST
DL/VA % pred: 46 %
DL/VA: 2.3 ml/min/mmHg/L
DLCO COR % PRED: 37 %
DLCO COR: 15.01 ml/min/mmHg
DLCO UNC: 15.3 ml/min/mmHg
DLCO unc % pred: 37 %
FEF 25-75 Post: 0.34 L/sec
FEF 25-75 Pre: 0.26 L/sec
FEF2575-%Change-Post: 29 %
FEF2575-%PRED-PRE: 7 %
FEF2575-%Pred-Post: 9 %
FEV1-%Change-Post: 19 %
FEV1-%PRED-POST: 19 %
FEV1-%Pred-Pre: 16 %
FEV1-POST: 0.85 L
FEV1-Pre: 0.71 L
FEV1FVC-%Change-Post: 7 %
FEV1FVC-%Pred-Pre: 40 %
FEV6-%Change-Post: 18 %
FEV6-%PRED-PRE: 35 %
FEV6-%Pred-Post: 42 %
FEV6-POST: 2.31 L
FEV6-Pre: 1.94 L
FEV6FVC-%Change-Post: 6 %
FEV6FVC-%PRED-PRE: 86 %
FEV6FVC-%Pred-Post: 92 %
FVC-%Change-Post: 11 %
FVC-%PRED-POST: 45 %
FVC-%PRED-PRE: 40 %
FVC-POST: 2.6 L
FVC-PRE: 2.34 L
POST FEV6/FVC RATIO: 89 %
PRE FEV1/FVC RATIO: 30 %
Post FEV1/FVC ratio: 33 %
Pre FEV6/FVC Ratio: 83 %

## 2017-01-22 NOTE — Progress Notes (Signed)
PFT done today. 

## 2017-01-25 ENCOUNTER — Encounter (HOSPITAL_COMMUNITY)
Admission: RE | Admit: 2017-01-25 | Discharge: 2017-01-25 | Disposition: A | Discharge status: Home / Self Care | Payer: Medicare Other | Source: Ambulatory Visit | Attending: Pulmonary Disease | Admitting: Pulmonary Disease

## 2017-01-25 ENCOUNTER — Encounter (HOSPITAL_COMMUNITY): Payer: Self-pay

## 2017-01-25 VITALS — BP 144/85 | HR 96 | Resp 20 | Ht 76.25 in | Wt 217.2 lb

## 2017-01-25 DIAGNOSIS — Z87891 Personal history of nicotine dependence: Secondary | ICD-10-CM | POA: Insufficient documentation

## 2017-01-25 DIAGNOSIS — J449 Chronic obstructive pulmonary disease, unspecified: Secondary | ICD-10-CM

## 2017-01-25 NOTE — Progress Notes (Signed)
Theodore [name] 61 y.o. male Pulmonary Rehab Orientation Note Patient arrived today in Cardiac and Pulmonary Rehab for orientation to Pulmonary Rehab. [facility] via wheel chair. He does not carry portable oxygen. Per pt, he uses oxygen continuously at night at 2 liters. He states that if he has "a coughing spell" he will use his oxygen during the day to recover. He has never been told that he needs oxygen during exertion. Color good, skin warm and dry. Patient is oriented to time and place. Patient's medical history, psychosocial health, and medications reviewed. Psychosocial assessment reveals pt lives alone, however since his last PE diagnosis in Jan 2018, his sister has moved in with him to help with household choirs. Pt is currently disabled. 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 Pt hobbies include fishing but he has not done this in several years. He has no other hobbies and spends most of his time watching TV. Pt reports his stress level is moderate. Areas of stress/anxiety include Health.  Pt does exibit signs of depression. Signs of depression include difficulty maintaining sleep. He stated he did go through a period of panic attacks and was place on anxiety medication. Since, his medication has been discontinued and his panic attacks have not returned. PHQ2/9 score 5/12. Pt shows fair  coping skills with positive outlook. He was offered emotional support and reassurance. Will continue to monitor and evaluate progress toward psychosocial goal(s) of decreasing PHQ2/9 scores through physical activity. He feels as the weather warms and he is able to get out he will have better scores. Physical assessment reveals heart rate is normal, breath sounds moderate to severe expiratory wheezing heard diffusely throughout both lungs. He was encouraged to use his rescue inhaler as prescribed and he demonstrated  good technique with good results. Wheezes remained post bronchodilator but the severity decreased. Patient stated he felt less short of breath.  Grip strength equal, strong. Distal pulses palpable but faint. Moderate amount of lower leg and ankle/foot pitting edema, R>L. Patient states this has been the case since his diagnosis of PE and MD aware. No evidence of DVT. Homans sign negative bilat. Patient reports he  does take medications as prescribed. Patient states he follows a Regular diet. 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 He was encouraged to utilize a low carb diabetic diet as well as given information on low sodium diet. He has a history of NSTEMI he states.  The patient reports no specific efforts to gain or lose weight. Patient's weight will be monitored closely. Demonstration and practice of PLB using pulse oximeter. Patient able to return demonstration satisfactorily. Safety and hand hygiene in the exercise area reviewed with patient. Patient voices understanding of the information reviewed. Department expectations discussed with patient and achievable goals were set. The patient shows enthusiasm about attending the program and we look forward to working with this nice gentleman. The patient is scheduled for a 6 min walk test on 3/22 and to begin exercise on 2/29 after his face to face with the pulmonary rehab medical director.   45 minutes was spent on a variety of activities such as assessment of the patient, obtaining baseline data including height, weight, BMI, and grip strength, verifying medical history, allergies, and current medications, and teaching patient strategies for performing tasks with less respiratory effort with emphasis on pursed lip breathing.

## 2017-01-28 ENCOUNTER — Encounter (HOSPITAL_COMMUNITY)
Admission: RE | Admit: 2017-01-28 | Discharge: 2017-01-28 | Disposition: A | Discharge status: Home / Self Care | Payer: Medicare Other | Source: Ambulatory Visit | Attending: Pulmonary Disease | Admitting: Pulmonary Disease

## 2017-01-28 DIAGNOSIS — Z87891 Personal history of nicotine dependence: Secondary | ICD-10-CM | POA: Diagnosis not present

## 2017-01-28 DIAGNOSIS — J449 Chronic obstructive pulmonary disease, unspecified: Secondary | ICD-10-CM

## 2017-01-28 NOTE — Progress Notes (Signed)
Pulmonary Individual Treatment Plan  Patient Details  Name: Theodore [name] MRN: [id number] Date of Birth: January 20, 1956 Referring Provider:     Pulmonary Rehab Walk Test from 01/28/2017 in [facility]ARD[facility]  Referring Provider  Dr. Kendrick Fries      Initial Encounter Date:    Pulmonary Rehab Walk Test from 01/28/2017 in [facility] CARDI[facility] REHAB  Date  01/28/17  Referring Provider  Dr. 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  albuterol (PROVENTIL) (2.5 MG/3ML) 0.083% nebulizer solution, Take 2.5 mg by nebulization every 6 (six) hours as needed for wheezing or shortness of breath., Disp: , Rfl:  .  albuterol (VENTOLIN HFA) 108 (90 Base) MCG/ACT inhaler, INHALE 2 PUFFS INTO THE LUNGS EVERY 4 HOURS AS NEEDED FOR SHORTNESS OF BREATH OR WHEEZING, Disp: 18 g, Rfl: 3 .  baclofen (LIORESAL) 10 MG tablet, Take 1 tablet (10 mg total) by mouth 3 (three) times daily. (Patient not taking: Reported on 01/11/2017), Disp: 30 each, Rfl: 0 .  budesonide-formoterol (SYMBICORT) 160-4.5 MCG/ACT inhaler, Inhale 2 puffs into the lungs 2 (two) times daily., Disp: 1 Inhaler, Rfl: 0 .  guaiFENesin (MUCINEX) 600 MG 12 hr tablet, Take 1,200 mg by mouth 2 (two) times daily. , Disp: , Rfl:  .  HYDROcodone-acetaminophen (NORCO) 5-325 MG tablet, Take 1 tablet by mouth every 6 (six) hours as needed for moderate pain. (Patient not taking: Reported on 01/11/2017), Disp: 20 tablet, Rfl: 0 .  Multiple Vitamin (MULTIVITAMIN WITH MINERALS) TABS tablet, Take 1 tablet by mouth 3 (three) times a week., Disp: , Rfl:  .  nicotine polacrilex (NICORETTE) 4 MG gum, Take 4 mg by mouth daily as needed for smoking cessation., Disp: , Rfl:  .  OXYGEN-HELIUM IN, Inhale 2 L into the lungs at bedtime. , Disp: , Rfl:  .  predniSONE (DELTASONE) 10 MG tablet, Take 10 mg by mouth daily with  breakfast., Disp: , Rfl:  .  rivaroxaban (XARELTO) 20 MG TABS tablet, Take 1 tablet (20 mg total) by mouth daily with supper., Disp: 30 tablet, Rfl: 6 .  sodium chloride (OCEAN) 0.65 % SOLN nasal spray, Place 1 spray into both nostrils 4 (four) times daily as needed for congestion., Disp: , Rfl:  .  SPIRIVA HANDIHALER 18 MCG inhalation capsule, INHALE CONTENTS OF 1 CAPSULE VIA HANDIHALER EVERY DAY, Disp: 30 capsule, Rfl: 5 .  triamterene-hydrochlorothiazide (MAXZIDE-25) 37.5-25 MG tablet, TAKE 1 TABLET BY MOUTH DAILY, Disp: 30 tablet, Rfl: 5  Past Medical History: Past Medical History:  Diagnosis Date  . Anxiety   . Arthritis    "back" (01/24/2014)  . Asthma   . Chronic bronchitis (HCC)   . Cluster headache    "last bout was summer 2014; had them q spring 1991-2001" (01/24/2014)  . COPD (chronic obstructive pulmonary disease) (HCC)   . GERD (gastroesophageal reflux disease)   . NSTEMI (non-ST elevated myocardial infarction) (HCC) 01/2014  . On home oxygen therapy    "2L; 24/7" (01/24/2014)  . Pneumonia 06/2013; 01/2014  . Pulmonary embolism (HCC) 01/23/2014  . Seasonal allergies     Tobacco Use: History  Smoking Status  . Former Smoker  . Packs/day: 2.00  . Years: 35.00  . Types: Cigarettes  . Quit date: 01/24/2012  Smokeless Tobacco  . Never Used    Comment: Remain smoke free    Labs:  Recent Review Flowsheet Data    Labs for ITP Cardiac and Pulmonary Rehab Latest Ref Rng & Units 01/10/2014 01/11/2014 09/03/2014 03/25/2015 04/30/2016   Cholestrol 0 - 200 mg/dL - 469 629(B) - 284(X)   LDLCALC 0 - 99 mg/dL - 82 324(M) - 010(U)   HDL >39.00 mg/dL - 81 72.53 - 66.44   Trlycerides 0.0 - 149.0 mg/dL - 75 034.7 - 425.9   Hemoglobin A1c 4.6 - 6.5 % 5.9(H) - - 5.9 5.8   PHART 7.350 - 7.450 7.382 - - - -   PCO2ART 35.0 - 45.0 mmHg 34.7(L) - - - -   HCO3 20.0 - 24.0 mEq/L 20.1 - - - -   TCO2 0 - 100 mmol/L 17.6 - - - -   ACIDBASEDEF 0.0 - 2.0 mmol/L 3.7(H) - - - -   O2SAT % 95.6 - - - -       Capillary Blood Glucose: Lab Results  Component Value Date   GLUCAP 109 (H) 01/13/2014   GLUCAP 96 01/13/2014   GLUCAP 138 (H) 01/12/2014   GLUCAP 169 (H) 01/12/2014   GLUCAP 135 (H) 01/12/2014     ADL UCSD:   Pulmonary Function Assessment:     Pulmonary Function Assessment - 01/25/17 1511      Breath   Bilateral Breath Sounds Wheezes;Expiratory   Shortness of Breath Limiting activity;Yes      Exercise Target Goals: Date: 01/28/17  Exercise Program Goal: Individual exercise prescription set with THRR, safety & activity barriers. Participant demonstrates ability to understand and report RPE using BORG scale, to self-measure pulse accurately, and to acknowledge the importance of the exercise prescription.  Exercise Prescription Goal: Starting with aerobic activity 30 plus minutes a day, 3 days per week for initial exercise prescription. Provide home exercise prescription and guidelines that participant acknowledges understanding prior to discharge.  Activity Barriers & Risk Stratification:     Activity Barriers & Cardiac Risk Stratification - 01/25/17 1429      Activity Barriers & Cardiac Risk Stratification   Activity Barriers Deconditioning;Shortness of Breath      6 Minute Walk:     6 Minute Walk    Row Name 01/28/17 1607         6 Minute Walk   Phase Initial     Distance 700 feet     Walk Time -  4 minutes 10 seconds total     # of Rest Breaks 1  1 minute 50 seconds     MPH 1.3     METS 2     RPE 18     Perceived Dyspnea  4     Symptoms Yes (comment)     Comments leg fatigue     Resting HR 122 bpm     Resting BP 106/70     Max Ex. HR 154 bpm     Max Ex. BP 154/90     2 Minute Post BP 154/80       Interval HR   Baseline HR 122     1 Minute HR 122     2 Minute HR 136     3 Minute HR 139     4 Minute HR 136     5 Minute HR 148     6 Minute HR 154     2 Minute Post HR 137     Interval Heart Rate? Yes       Interval Oxygen    Interval Oxygen? Yes  Baseline Oxygen Saturation % 91 %     Baseline Liters of Oxygen 0 L     1 Minute Oxygen Saturation % 91 %     1 Minute Liters of Oxygen 0 L     2 Minute Oxygen Saturation % 91 %     2 Minute Liters of Oxygen 0 L     3 Minute Oxygen Saturation % 93 %     3 Minute Liters of Oxygen 0 L     4 Minute Oxygen Saturation % 94 %     4 Minute Liters of Oxygen 0 L     5 Minute Oxygen Saturation % 92 %     5 Minute Liters of Oxygen 0 L     6 Minute Oxygen Saturation % 92 %     6 Minute Liters of Oxygen 0 L     2 Minute Post Oxygen Saturation % 95 %     2 Minute Post Liters of Oxygen 0 L        Oxygen Initial Assessment:     Oxygen Initial Assessment - 01/25/17 1428      Home Oxygen   Home Oxygen Device Home Concentrator   Sleep Oxygen Prescription Continuous   Liters per minute 2   Home Exercise Oxygen Prescription None   Home at Rest Exercise Oxygen Prescription None   Compliance with Home Oxygen Use Yes      Oxygen Re-Evaluation:   Oxygen Discharge (Final Oxygen Re-Evaluation):   Initial Exercise Prescription:     Initial Exercise Prescription - 01/28/17 1600      Date of Initial Exercise RX and Referring Provider   Date 01/28/17   Referring Provider Dr. Kendrick Fries     NuStep   Level 1   Minutes 34   METs 1.5     Track   Laps 3   Minutes 17     Prescription Details   Frequency (times per week) 2   Duration Progress to 45 minutes of aerobic exercise without signs/symptoms of physical distress     Intensity   THRR 40-80% of Max Heartrate 63-126   Ratings of Perceived Exertion 11-13   Perceived Dyspnea 0-4     Progression   Progression Continue progressive overload as per policy without signs/symptoms or physical distress.     Resistance Training   Training Prescription Yes   Weight orange bands   Reps 10-15      Perform Capillary Blood Glucose checks as needed.  Exercise Prescription Changes:   Exercise  Comments:   Exercise Goals and Review:     Exercise Goals    Row Name 01/25/17 1431             Exercise Goals   Increase Physical Activity Yes       Intervention Provide advice, education, support and counseling about physical activity/exercise needs.;Develop an individualized exercise prescription for aerobic and resistive training based on initial evaluation findings, risk stratification, comorbidities and participant's personal goals.       Expected Outcomes Achievement of increased cardiorespiratory fitness and enhanced flexibility, muscular endurance and strength shown through measurements of functional capacity and personal statement of participant.       Increase Strength and Stamina Yes       Intervention Provide advice, education, support and counseling about physical activity/exercise needs.;Develop an individualized exercise prescription for aerobic and resistive training based on initial evaluation findings, risk stratification, comorbidities and participant's personal goals.  Expected Outcomes Achievement of increased cardiorespiratory fitness and enhanced flexibility, muscular endurance and strength shown through measurements of functional capacity and personal statement of participant.          Exercise Goals Re-Evaluation :   Discharge Exercise Prescription (Final Exercise Prescription Changes):   Nutrition:  Target Goals: Understanding of nutrition guidelines, daily intake of sodium 1500mg , cholesterol 200mg , calories 30% from fat and 7% or less from saturated fats, daily to have 5 or more servings of fruits and vegetables.  Biometrics:     Pre Biometrics - 01/25/17 1430      Pre Biometrics   Height 6' 4.25" (1.937 m)   Weight 217 lb 2.5 oz (98.5 kg)   BMI (Calculated) 26.3   Grip Strength 30 kg       Nutrition Therapy Plan and Nutrition Goals:   Nutrition Discharge: Rate Your Plate Scores:   Nutrition Goals Re-Evaluation:   Nutrition  Goals Discharge (Final Nutrition Goals Re-Evaluation):   Psychosocial: Target Goals: Acknowledge presence or absence of significant depression and/or stress, maximize coping skills, provide positive support system. Participant is able to verbalize types and ability to use techniques and skills needed for reducing stress and depression.  Initial Review & Psychosocial Screening:     Initial Psych Review & Screening - 01/25/17 1511      Initial Review   Current issues with Current Stress Concerns   Source of Stress Concerns Chronic Illness     Family Dynamics   Good Support System? Yes     Barriers   Psychosocial barriers to participate in program There are no identifiable barriers or psychosocial needs.     Screening Interventions   Interventions Encouraged to exercise      Quality of Life Scores:   PHQ-9: Recent Review Flowsheet Data    [facility] 01/25/2017 04/30/2016   Decreased Interest 2 0   Down, Depressed, Hopeless 3 0   PHQ - 2 Score 5 0   Altered sleeping 1 -   Tired, decreased energy 3 -   Change in appetite 0 -   Feeling bad or failure about yourself  3 -   Trouble concentrating 0 -   Moving slowly or fidgety/restless 0 -   Suicidal thoughts 0 -   PHQ-9 Score 12 -   Difficult doing work/chores Somewhat difficult -     Interpretation of Total Score  Total Score Depression Severity:  1-4 = Minimal depression, 5-9 = Mild depression, 10-14 = Moderate depression, 15-19 = Moderately severe depression, 20-27 = Severe depression   Psychosocial Evaluation and Intervention:     Psychosocial Evaluation - 01/25/17 1515      Psychosocial Evaluation & Interventions   Continue Psychosocial Services  Follow up required by counselor      Psychosocial Re-Evaluation:   Psychosocial Discharge (Final Psychosocial Re-Evaluation):   Education: Education Goals: Education classes will be provided on a weekly basis, covering required topics. Participant  will state understanding/return demonstration of topics presented.  Learning Barriers/Preferences:     Learning Barriers/Preferences - 01/25/17 1510      Learning Barriers/Preferences   Learning Barriers None   Learning Preferences Computer/Internet;Written Material;Verbal Instruction;Individual Instruction;Group Instruction      Education Topics: Risk Factor Reduction:  -Group instruction that is supported by a PowerPoint presentation. Instructor discusses the definition of a risk factor, different risk factors for pulmonary disease, and how the heart and lungs work together.     Nutrition for Pulmonary Patient:  -Group instruction provided  by PowerPoint slides, verbal discussion, and written materials to support subject matter. The instructor gives an explanation and review of healthy diet recommendations, which includes a discussion on weight management, recommendations for fruit and vegetable consumption, as well as protein, fluid, caffeine, fiber, sodium, sugar, and alcohol. Tips for eating when patients are short of breath are discussed.   Pursed Lip Breathing:  -Group instruction that is supported by demonstration and informational handouts. Instructor discusses the benefits of pursed lip and diaphragmatic breathing and detailed demonstration on how to preform both.     Oxygen Safety:  -Group instruction provided by PowerPoint, verbal discussion, and written material to support subject matter. There is an overview of "What is Oxygen" and "Why do we need it".  Instructor also reviews how to create a safe environment for oxygen use, the importance of using oxygen as prescribed, and the risks of noncompliance. There is a brief discussion on traveling with oxygen and resources the patient may utilize.   Oxygen Equipment:  -Group instruction provided by [facility] Staff utilizing handouts, written materials, and equipment demonstrations.   Signs and Symptoms:  -Group instruction  provided by written material and verbal discussion to support subject matter. Warning signs and symptoms of infection, stroke, and heart attack are reviewed and when to call the physician/911 reinforced. Tips for preventing the spread of infection discussed.   Advanced Directives:  -Group instruction provided by verbal instruction and written material to support subject matter. Instructor reviews Advanced Directive laws and proper instruction for filling out document.   Pulmonary Video:  -Group video education that reviews the importance of medication and oxygen compliance, exercise, good nutrition, pulmonary hygiene, and pursed lip and diaphragmatic breathing for the pulmonary patient.   Exercise for the Pulmonary Patient:  -Group instruction that is supported by a PowerPoint presentation. Instructor discusses benefits of exercise, core components of exercise, frequency, duration, and intensity of an exercise routine, importance of utilizing pulse oximetry during exercise, safety while exercising, and options of places to exercise outside of rehab.     Pulmonary Medications:  -Verbally interactive group education provided by instructor with focus on inhaled medications and proper administration.   Anatomy and Physiology of the Respiratory System and Intimacy:  -Group instruction provided by PowerPoint, verbal discussion, and written material to support subject matter. Instructor reviews respiratory cycle and anatomical components of the respiratory system and their functions. Instructor also reviews differences in obstructive and restrictive respiratory diseases with examples of each. Intimacy, Sex, and Sexuality differences are reviewed with a discussion on how relationships can change when diagnosed with pulmonary disease. Common sexual concerns are reviewed.   Knowledge Questionnaire Score:   Core Components/Risk Factors/Patient Goals at Admission:     Personal Goals and Risk Factors  at Admission - 01/25/17 1515      Core Components/Risk Factors/Patient Goals on Admission    Weight Management Yes   Intervention Weight Management: Develop a combined nutrition and exercise program designed to reach desired caloric intake, while maintaining appropriate intake of nutrient and fiber, sodium and fats, and appropriate energy expenditure required for the weight goal.   Expected Outcomes Short Term: Continue to assess and modify interventions until short term weight is achieved;Weight Loss: Understanding of general recommendations for a balanced deficit meal plan, which promotes 1-2 lb weight loss per week and includes a negative energy balance of [phone] kcal/d;Understanding recommendations for meals to include 15-35% energy as protein, 25-35% energy from fat, 35-60% energy from carbohydrates, less than 200mg  of dietary  cholesterol, 20-35 gm of total fiber daily;Understanding of distribution of calorie intake throughout the day with the consumption of 4-5 meals/snacks   Improve shortness of breath with ADL's Yes   Intervention Provide education, individualized exercise plan and daily activity instruction to help decrease symptoms of SOB with activities of daily living.   Expected Outcomes Short Term: Achieves a reduction of symptoms when performing activities of daily living.   Develop more efficient breathing techniques such as purse lipped breathing and diaphragmatic breathing; and practicing self-pacing with activity Yes   Intervention Provide education, demonstration and support about specific breathing techniuqes utilized for more efficient breathing. Include techniques such as pursed lipped breathing, diaphragmatic breathing and self-pacing activity.   Expected Outcomes Short Term: Participant will be able to demonstrate and use breathing techniques as needed throughout daily activities.   Intervention Refer participants experiencing significant psychosocial distress to appropriate  mental health specialists for further evaluation and treatment. When possible, include family members and significant others in education/counseling sessions.;Offer individual and/or small group education and counseling on adjustment to heart disease, stress management and health-related lifestyle change. Teach and support self-help strategies.   Expected Outcomes Long Term: Emotional wellbeing is indicated by absence of clinically significant psychosocial distress or social isolation.;Short Term: Participant demonstrates changes in health-related behavior, relaxation and other stress management skills, ability to obtain effective social support, and compliance with psychotropic medications if prescribed.      Core Components/Risk Factors/Patient Goals Review:    Core Components/Risk Factors/Patient Goals at Discharge (Final Review):    ITP Comments:   Comments:

## 2017-01-30 ENCOUNTER — Other Ambulatory Visit: Payer: Self-pay | Admitting: Pulmonary Disease

## 2017-01-31 ENCOUNTER — Other Ambulatory Visit: Payer: Self-pay | Admitting: Pulmonary Disease

## 2017-02-04 ENCOUNTER — Telehealth (HOSPITAL_COMMUNITY): Payer: Self-pay | Admitting: *Deleted

## 2017-02-04 ENCOUNTER — Telehealth: Payer: Self-pay | Admitting: Pulmonary Disease

## 2017-02-04 ENCOUNTER — Encounter (HOSPITAL_COMMUNITY)
Admission: RE | Admit: 2017-02-04 | Discharge: 2017-02-04 | Disposition: A | Discharge status: Home / Self Care | Payer: Medicare Other | Source: Ambulatory Visit | Attending: Pulmonary Disease | Admitting: Pulmonary Disease

## 2017-02-04 VITALS — Wt 214.9 lb

## 2017-02-04 DIAGNOSIS — J449 Chronic obstructive pulmonary disease, unspecified: Secondary | ICD-10-CM

## 2017-02-04 DIAGNOSIS — Z87891 Personal history of nicotine dependence: Secondary | ICD-10-CM | POA: Diagnosis not present

## 2017-02-04 DIAGNOSIS — J9610 Chronic respiratory failure, unspecified whether with hypoxia or hypercapnia: Secondary | ICD-10-CM

## 2017-02-04 NOTE — Progress Notes (Signed)
Daily Session Note  Patient Details  Name: Theodore [name] MRN: [id number] Date of Birth: [birth year] Referring Provider:     Pulmonary Rehab Walk Test from 01/28/2017 in [place]  Referring Provider  Dr. Lake Bells      Encounter Date: 02/04/2017  Check In:     Session Check In - 02/04/17 1017      Check-In   Location [facility]   Staff Present Rosebud Poles, RN, BSN;Krystyne Tewksbury, MS, ACSM RCEP, Exercise Physiologist;Lisa Ysidro Evert, RN   Supervising physician immediately available to respond to emergencies [facility] immediately available   Physician(s) Dr. Sloan Leiter   Medication changes reported     No   Fall or balance concerns reported    No   Tobacco Cessation No Change   Warm-up and Cool-down Performed as group-led instruction   Resistance Training Performed Yes   VAD Patient? No     Pain Assessment   Currently in Pain? No/denies   Multiple Pain Sites No      Capillary Blood Glucose: No results found for this or any previous visit (from the past 24 hour(s)).      Exercise Prescription Changes - 02/04/17 1200      Response to Exercise   Blood Pressure (Admit) 100/70   Blood Pressure (Exercise) 124/60   Blood Pressure (Exit) 112/70   Heart Rate (Admit) 88 bpm   Heart Rate (Exercise) 121 bpm   Heart Rate (Exit) 102 bpm   Oxygen Saturation (Admit) 97 %   Oxygen Saturation (Exercise) 95 %   Oxygen Saturation (Exit) 96 %   Rating of Perceived Exertion (Exercise) 13   Perceived Dyspnea (Exercise) 3   Duration Continue with 45 min of aerobic exercise without signs/symptoms of physical distress.   Intensity THRR unchanged     Progression   Progression Continue to progress workloads to maintain intensity without signs/symptoms of physical distress.     Resistance Training   Training Prescription Yes   Weight orange bands   Reps 10-15   Time 10 Minutes     Interval Training   Interval Training No     NuStep   Level 1   Minutes 34   METs 1.5      History  Smoking Status  . Former Smoker  . Packs/day: 2.00  . Years: 35.00  . Types: Cigarettes  . Quit date: 01/24/2012  Smokeless Tobacco  . Never Used    Comment: Remain smoke free    Goals Met:  Exercise tolerated well No report of cardiac concerns or symptoms Strength training completed today  Goals Unmet:  Not Applicable  Comments: Service time is from 10:30a to 12:30p    Dr. Rush Farmer is Medical Director for Pulmonary Rehab at [facility].

## 2017-02-04 NOTE — Progress Notes (Signed)
Initial Pulmonary Rehab Evaluation Note  S: reports feeling a cough that started 1 wk ago where he increased steroids and now feels better.  O: Tremulous but over all appears at baseline for him HEENT: [place]/AT, PERRL, EOM-I and MMM Hrt: RRR, Nl S1/S2, -M/R/G. Lung: Quite but clear with no wheezing. Abd: Soft, NT, ND and +BS. Ext: erythematous but not hypoxic (sat 97% on RA at rest and did not drop with activity), clubbing Skin: erythematous but intact.  A/P: 61 year old male with COPD who quite smoking years ago but dependent on 2L O2 at night not during the day.  Clubbing evident and reports sometime with activity O2 sat drops to 70's.    - Will need overnight desat study  - Monitor O2 sat with activity (only walked 700 feet and HR increased and test was stopped)  - Echo with mild pulmonary HTN but clubbing is concerning.  Alyson ReedyWesam G. Yacoub, M.D. [facility]. Pager: [phone]. After hours pager: [phone].

## 2017-02-04 NOTE — Telephone Encounter (Signed)
lmomtcb x1 

## 2017-02-08 NOTE — Telephone Encounter (Signed)
Spoke with pt and let him know someone will be calling him to set this up  Order sent to [facility]

## 2017-02-08 NOTE — Telephone Encounter (Signed)
Spoke with Theodore [name], who states Dr. Angelene Giovanni came in and spoke with pt during his first day of pulm rehab. Pt was experiencing  some discoloration. Pt's O2 sat were within normal range at pulm rehab. Dr. Angelene Giovanni is concerned that pt's O2 may desatting qhs, and recommended ordering an ONO.  CY please advise, as BQ is 11pm elink.  Thanks.

## 2017-02-08 NOTE — Telephone Encounter (Signed)
Ok - DME    ONOX on his O2 as suggested by Dr Molli Knock       Dx COPD mixed type

## 2017-02-09 ENCOUNTER — Encounter (HOSPITAL_COMMUNITY): Payer: Medicare Other

## 2017-02-11 ENCOUNTER — Encounter (HOSPITAL_COMMUNITY): Payer: Medicare Other

## 2017-02-16 ENCOUNTER — Encounter (HOSPITAL_COMMUNITY)
Admission: RE | Admit: 2017-02-16 | Discharge: 2017-02-16 | Disposition: A | Discharge status: Home / Self Care | Payer: Medicare Other | Source: Ambulatory Visit | Attending: Pulmonary Disease | Admitting: Pulmonary Disease

## 2017-02-16 VITALS — Wt 218.9 lb

## 2017-02-16 DIAGNOSIS — Z87891 Personal history of nicotine dependence: Secondary | ICD-10-CM | POA: Diagnosis not present

## 2017-02-16 DIAGNOSIS — J449 Chronic obstructive pulmonary disease, unspecified: Secondary | ICD-10-CM | POA: Insufficient documentation

## 2017-02-16 NOTE — Progress Notes (Signed)
Pulmonary Individual Treatment Plan  Patient Details  Name: Theodore [name] MRN: [id number] Date of Birth: 1956/05/28 Referring Provider:     Pulmonary Rehab Walk Test from 01/28/2017 in [place]  Referring Provider  Dr. Lake Bells      Initial Encounter Date:    Pulmonary Rehab Walk Test from 01/28/2017 in [place]  Date  01/28/17  Referring Provider  Dr. 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  albuterol (PROVENTIL) (2.5 MG/3ML) 0.083% nebulizer solution, Take 2.5 mg by nebulization every 6 (six) hours as needed for wheezing or shortness of breath., Disp: , Rfl:  .  albuterol (VENTOLIN HFA) 108 (90 Base) MCG/ACT inhaler, INHALE 2 PUFFS INTO THE LUNGS EVERY 4 HOURS AS NEEDED FOR SHORTNESS OF BREATH OR WHEEZING, Disp: 18 g, Rfl: 3 .  baclofen (LIORESAL) 10 MG tablet, Take 1 tablet (10 mg total) by mouth 3 (three) times daily. (Patient not taking: Reported on 01/11/2017), Disp: 30 each, Rfl: 0 .  budesonide-formoterol (SYMBICORT) 160-4.5 MCG/ACT inhaler, Inhale 2 puffs into the lungs 2 (two) times daily., Disp: 1 Inhaler, Rfl: 0 .  guaiFENesin (MUCINEX) 600 MG 12 hr tablet, Take 1,200 mg by mouth 2 (two) times daily. , Disp: , Rfl:  .  HYDROcodone-acetaminophen (NORCO) 5-325 MG tablet, Take 1 tablet by mouth every 6 (six) hours as needed for moderate pain. (Patient not taking: Reported on 01/11/2017), Disp: 20 tablet, Rfl: 0 .  Multiple Vitamin (MULTIVITAMIN WITH MINERALS) TABS tablet, Take 1 tablet by mouth 3 (three) times a week., Disp: , Rfl:  .  nicotine polacrilex (NICORETTE) 4 MG gum, Take 4 mg by mouth daily as needed for smoking cessation., Disp: , Rfl:  .  OXYGEN-HELIUM IN, Inhale 2 L into the lungs at bedtime. , Disp: , Rfl:  .  predniSONE (DELTASONE) 10 MG tablet, Take 10 mg by mouth daily with  breakfast., Disp: , Rfl:  .  rivaroxaban (XARELTO) 20 MG TABS tablet, Take 1 tablet (20 mg total) by mouth daily with supper., Disp: 30 tablet, Rfl: 6 .  sodium chloride (OCEAN) 0.65 % SOLN nasal spray, Place 1 spray into both nostrils 4 (four) times daily as needed for congestion., Disp: , Rfl:  .  SPIRIVA HANDIHALER 18 MCG inhalation capsule, INHALE THE CONTENTS OF 1 CAPSULE VIA INHALATION DEVICE EVERY DAY, Disp: 30 capsule, Rfl: 5 .  SYMBICORT 160-4.5 MCG/ACT inhaler, TAKE 2 PUFFS FIRST THING IN THE MORNING AND THEN ANOTHER 2 PUFFS ABOUT 12 HOURS LATER, Disp: 10.2 g, Rfl: 5 .  triamterene-hydrochlorothiazide (MAXZIDE-25) 37.5-25 MG tablet, TAKE 1 TABLET BY MOUTH DAILY, Disp: 30 tablet, Rfl: 5  Past Medical History: Past Medical History:  Diagnosis Date  . Anxiety   . Arthritis    "back" (01/24/2014)  . Asthma   . Chronic bronchitis ([place])   . Cluster headache    "last bout was summer 2014; had them q spring 1991-2001" (01/24/2014)  . COPD (chronic obstructive pulmonary disease) ([place])   . GERD (gastroesophageal reflux disease)   . NSTEMI (non-ST elevated myocardial infarction) ([place]) 01/2014  . On home oxygen therapy    "2L; 24/7" (01/24/2014)  . Pneumonia 06/2013; 01/2014  . Pulmonary embolism ([place]) 01/23/2014  . Seasonal allergies     Tobacco Use: History  Smoking Status  . Former Smoker  . Packs/day: 2.00  .  Years: 35.00  . Types: Cigarettes  . Quit date: 01/24/2012  Smokeless Tobacco  . Never Used    Comment: Remain smoke free    Labs: Recent Review Flowsheet Data    Labs for ITP Cardiac and Pulmonary Rehab Latest Ref Rng & Units 01/10/2014 01/11/2014 09/03/2014 03/25/2015 04/30/2016   Cholestrol 0 - 200 mg/dL - 178 231(H) - 217(H)   LDLCALC 0 - 99 mg/dL - 82 145(H) - 135(H)   HDL >39.00 mg/dL - 81 65.50 - 61.70   Trlycerides 0.0 - 149.0 mg/dL - 75 101.0 - 102.0   Hemoglobin A1c 4.6 - 6.5 % 5.9(H) - - 5.9 5.8   PHART 7.350 - 7.450 7.382 - - - -   PCO2ART 35.0 - 45.0 mmHg  34.7(L) - - - -   HCO3 20.0 - 24.0 mEq/L 20.1 - - - -   TCO2 0 - 100 mmol/L 17.6 - - - -   ACIDBASEDEF 0.0 - 2.0 mmol/L 3.7(H) - - - -   O2SAT % 95.6 - - - -      Capillary Blood Glucose: Lab Results  Component Value Date   GLUCAP 109 (H) 01/13/2014   GLUCAP 96 01/13/2014   GLUCAP 138 (H) 01/12/2014   GLUCAP 169 (H) 01/12/2014   GLUCAP 135 (H) 01/12/2014     ADL UCSD:   Pulmonary Function Assessment:     Pulmonary Function Assessment - 01/25/17 1511      Breath   Bilateral Breath Sounds Wheezes;Expiratory   Shortness of Breath Limiting activity;Yes      Exercise Target Goals:    Exercise Program Goal: Individual exercise prescription set with THRR, safety & activity barriers. Participant demonstrates ability to understand and report RPE using BORG scale, to self-measure pulse accurately, and to acknowledge the importance of the exercise prescription.  Exercise Prescription Goal: Starting with aerobic activity 30 plus minutes a day, 3 days per week for initial exercise prescription. Provide home exercise prescription and guidelines that participant acknowledges understanding prior to discharge.  Activity Barriers & Risk Stratification:     Activity Barriers & Cardiac Risk Stratification - 01/25/17 1429      Activity Barriers & Cardiac Risk Stratification   Activity Barriers Deconditioning;Shortness of Breath      6 Minute Walk:     6 Minute Walk    Row Name 01/28/17 1607         6 Minute Walk   Phase Initial     Distance 700 feet     Walk Time -  4 minutes 10 seconds total     # of Rest Breaks 1  1 minute 50 seconds     MPH 1.3     METS 2     RPE 18     Perceived Dyspnea  4     Symptoms Yes (comment)     Comments leg fatigue     Resting HR 122 bpm     Resting BP 106/70     Max Ex. HR 154 bpm     Max Ex. BP 154/90     2 Minute Post BP 154/80       Interval HR   Baseline HR 122     1 Minute HR 122     2 Minute HR 136     3 Minute HR 139      4 Minute HR 136     5 Minute HR 148     6 Minute HR 154  2 Minute Post HR 137     Interval Heart Rate? Yes       Interval Oxygen   Interval Oxygen? Yes     Baseline Oxygen Saturation % 91 %     Baseline Liters of Oxygen 0 L     1 Minute Oxygen Saturation % 91 %     1 Minute Liters of Oxygen 0 L     2 Minute Oxygen Saturation % 91 %     2 Minute Liters of Oxygen 0 L     3 Minute Oxygen Saturation % 93 %     3 Minute Liters of Oxygen 0 L     4 Minute Oxygen Saturation % 94 %     4 Minute Liters of Oxygen 0 L     5 Minute Oxygen Saturation % 92 %     5 Minute Liters of Oxygen 0 L     6 Minute Oxygen Saturation % 92 %     6 Minute Liters of Oxygen 0 L     2 Minute Post Oxygen Saturation % 95 %     2 Minute Post Liters of Oxygen 0 L        Oxygen Initial Assessment:     Oxygen Initial Assessment - 01/25/17 1428      Home Oxygen   Home Oxygen Device Home Concentrator   Sleep Oxygen Prescription Continuous   Liters per minute 2   Home Exercise Oxygen Prescription None   Home at Rest Exercise Oxygen Prescription None   Compliance with Home Oxygen Use Yes      Oxygen Re-Evaluation:     Oxygen Re-Evaluation    Row Name 02/15/17 1146             Program Oxygen Prescription   Program Oxygen Prescription None         Home Oxygen   Home Oxygen Device Home Concentrator       Sleep Oxygen Prescription Continuous       Liters per minute 2       Home Exercise Oxygen Prescription None       Home at Rest Exercise Oxygen Prescription None       Compliance with Home Oxygen Use Yes          Oxygen Discharge (Final Oxygen Re-Evaluation):     Oxygen Re-Evaluation - 02/15/17 1146      Program Oxygen Prescription   Program Oxygen Prescription None     Home Oxygen   Home Oxygen Device Home Concentrator   Sleep Oxygen Prescription Continuous   Liters per minute 2   Home Exercise Oxygen Prescription None   Home at Rest Exercise Oxygen Prescription None    Compliance with Home Oxygen Use Yes      Initial Exercise Prescription:     Initial Exercise Prescription - 01/28/17 1600      Date of Initial Exercise RX and Referring Provider   Date 01/28/17   Referring Provider Dr. Lake Bells     NuStep   Level 1   Minutes 34   METs 1.5     Track   Laps 3   Minutes 17     Prescription Details   Frequency (times per week) 2   Duration Progress to 45 minutes of aerobic exercise without signs/symptoms of physical distress     Intensity   THRR 40-80% of Max Heartrate 63-126   Ratings of Perceived Exertion 11-13   Perceived Dyspnea 0-4  Progression   Progression Continue progressive overload as per policy without signs/symptoms or physical distress.     Resistance Training   Training Prescription Yes   Weight orange bands   Reps 10-15      Perform Capillary Blood Glucose checks as needed.  Exercise Prescription Changes:     Exercise Prescription Changes    Row Name 02/04/17 1200             Response to Exercise   Blood Pressure (Admit) 100/70       Blood Pressure (Exercise) 124/60       Blood Pressure (Exit) 112/70       Heart Rate (Admit) 88 bpm       Heart Rate (Exercise) 121 bpm       Heart Rate (Exit) 102 bpm       Oxygen Saturation (Admit) 97 %       Oxygen Saturation (Exercise) 95 %       Oxygen Saturation (Exit) 96 %       Rating of Perceived Exertion (Exercise) 13       Perceived Dyspnea (Exercise) 3       Duration Continue with 45 min of aerobic exercise without signs/symptoms of physical distress.       Intensity THRR unchanged         Progression   Progression Continue to progress workloads to maintain intensity without signs/symptoms of physical distress.         Resistance Training   Training Prescription Yes       Weight orange bands       Reps 10-15       Time 10 Minutes         Interval Training   Interval Training No         NuStep   Level 1       Minutes 34       METs 1.5           Exercise Comments:   Exercise Goals and Review:     Exercise Goals    Row Name 01/25/17 1431             Exercise Goals   Increase Physical Activity Yes       Intervention Provide advice, education, support and counseling about physical activity/exercise needs.;Develop an individualized exercise prescription for aerobic and resistive training based on initial evaluation findings, risk stratification, comorbidities and participant's personal goals.       Expected Outcomes Achievement of increased cardiorespiratory fitness and enhanced flexibility, muscular endurance and strength shown through measurements of functional capacity and personal statement of participant.       Increase Strength and Stamina Yes       Intervention Provide advice, education, support and counseling about physical activity/exercise needs.;Develop an individualized exercise prescription for aerobic and resistive training based on initial evaluation findings, risk stratification, comorbidities and participant's personal goals.       Expected Outcomes Achievement of increased cardiorespiratory fitness and enhanced flexibility, muscular endurance and strength shown through measurements of functional capacity and personal statement of participant.          Exercise Goals Re-Evaluation :     Exercise Goals Re-Evaluation    Row Name 02/16/17 0752             Exercise Goal Re-Evaluation   Exercise Goals Review Increase Physical Activity;Increase Strenth and Stamina       Comments Patient has only attended one exercise session. Will cont.  to monitor and progress as able.        Expected Outcomes Patient will cont. to increase endurance and stamina through the exercises here at rehab. He will also practice his breathing techinques to become less short of breath on exertion.           Discharge Exercise Prescription (Final Exercise Prescription Changes):     Exercise Prescription Changes - 02/04/17 1200       Response to Exercise   Blood Pressure (Admit) 100/70   Blood Pressure (Exercise) 124/60   Blood Pressure (Exit) 112/70   Heart Rate (Admit) 88 bpm   Heart Rate (Exercise) 121 bpm   Heart Rate (Exit) 102 bpm   Oxygen Saturation (Admit) 97 %   Oxygen Saturation (Exercise) 95 %   Oxygen Saturation (Exit) 96 %   Rating of Perceived Exertion (Exercise) 13   Perceived Dyspnea (Exercise) 3   Duration Continue with 45 min of aerobic exercise without signs/symptoms of physical distress.   Intensity THRR unchanged     Progression   Progression Continue to progress workloads to maintain intensity without signs/symptoms of physical distress.     Resistance Training   Training Prescription Yes   Weight orange bands   Reps 10-15   Time 10 Minutes     Interval Training   Interval Training No     NuStep   Level 1   Minutes 34   METs 1.5      Nutrition:  Target Goals: Understanding of nutrition guidelines, daily intake of sodium <1540m, cholesterol <2028m calories 30% from fat and 7% or less from saturated fats, daily to have 5 or more servings of fruits and vegetables.  Biometrics:     Pre Biometrics - 01/25/17 1430      Pre Biometrics   Height 6' 4.25" (1.937 m)   Weight 217 lb 2.5 oz (98.5 kg)   BMI (Calculated) 26.3   Grip Strength 30 kg       Nutrition Therapy Plan and Nutrition Goals:   Nutrition Discharge: Rate Your Plate Scores:   Nutrition Goals Re-Evaluation:   Nutrition Goals Discharge (Final Nutrition Goals Re-Evaluation):   Psychosocial: Target Goals: Acknowledge presence or absence of significant depression and/or stress, maximize coping skills, provide positive support system. Participant is able to verbalize types and ability to use techniques and skills needed for reducing stress and depression.  Initial Review & Psychosocial Screening:     Initial Psych Review & Screening - 01/25/17 1511      Initial Review   Current issues with Current  Stress Concerns   Source of Stress Concerns Chronic Illness     Family Dynamics   Good Support System? Yes     Barriers   Psychosocial barriers to participate in program There are no identifiable barriers or psychosocial needs.     Screening Interventions   Interventions Encouraged to exercise      Quality of Life Scores:   PHQ-9: Recent Review Flowsheet Data    [facility] 01/25/2017 04/30/2016   Decreased Interest 2 0   Down, Depressed, Hopeless 3 0   PHQ - 2 Score 5 0   Altered sleeping 1 -   Tired, decreased energy 3 -   Change in appetite 0 -   Feeling bad or failure about yourself  3 -   Trouble concentrating 0 -   Moving slowly or fidgety/restless 0 -   Suicidal thoughts 0 -   PHQ-9 Score 12 -  Difficult doing work/chores Somewhat difficult -     Interpretation of Total Score  Total Score Depression Severity:  1-4 = Minimal depression, 5-9 = Mild depression, 10-14 = Moderate depression, 15-19 = Moderately severe depression, 20-27 = Severe depression   Psychosocial Evaluation and Intervention:     Psychosocial Evaluation - 01/25/17 1515      Psychosocial Evaluation & Interventions   Continue Psychosocial Services  Follow up required by counselor      Psychosocial Re-Evaluation:     Psychosocial Re-Evaluation    [place] Name 02/15/17 1155             Psychosocial Re-Evaluation   Current issues with Current Stress Concerns       Comments Feels stressed due to his chronic illness, is presently seeing a counselor to help deal with these feelings.       Expected Outcomes Hopefully exercise will improve his quality of life by improving his strength and stamina        Interventions Encouraged to attend Pulmonary Rehabilitation for the exercise  is also seeing a counselor       Continue Psychosocial Services  Follow up required by counselor         Initial Review   Source of Stress Concerns Chronic Illness          Psychosocial Discharge  (Final Psychosocial Re-Evaluation):     Psychosocial Re-Evaluation - 02/15/17 1155      Psychosocial Re-Evaluation   Current issues with Current Stress Concerns   Comments Feels stressed due to his chronic illness, is presently seeing a counselor to help deal with these feelings.   Expected Outcomes Hopefully exercise will improve his quality of life by improving his strength and stamina    Interventions Encouraged to attend Pulmonary Rehabilitation for the exercise  is also seeing a counselor   Continue Psychosocial Services  Follow up required by counselor     Initial Review   Source of Stress Concerns Chronic Illness      Education: Education Goals: Education classes will be provided on a weekly basis, covering required topics. Participant will state understanding/return demonstration of topics presented.  Learning Barriers/Preferences:     Learning Barriers/Preferences - 01/25/17 1510      Learning Barriers/Preferences   Learning Barriers None   Learning Preferences Computer/Internet;Written Material;Verbal Instruction;Individual Instruction;Group Instruction      Education Topics: Risk Factor Reduction:  -Group instruction that is supported by a PowerPoint presentation. Instructor discusses the definition of a risk factor, different risk factors for pulmonary disease, and how the heart and lungs work together.     Nutrition for Pulmonary Patient:  -Group instruction provided by PowerPoint slides, verbal discussion, and written materials to support subject matter. The instructor gives an explanation and review of healthy diet recommendations, which includes a discussion on weight management, recommendations for fruit and vegetable consumption, as well as protein, fluid, caffeine, fiber, sodium, sugar, and alcohol. Tips for eating when patients are short of breath are discussed.   Pursed Lip Breathing:  -Group instruction that is supported by demonstration and  informational handouts. Instructor discusses the benefits of pursed lip and diaphragmatic breathing and detailed demonstration on how to preform both.     Oxygen Safety:  -Group instruction provided by PowerPoint, verbal discussion, and written material to support subject matter. There is an overview of "What is Oxygen" and "Why do we need it".  Instructor also reviews how to create a safe environment for oxygen use, the importance of  using oxygen as prescribed, and the risks of noncompliance. There is a brief discussion on traveling with oxygen and resources the patient may utilize.   Oxygen Equipment:  -Group instruction provided by [facility] Staff utilizing handouts, written materials, and equipment demonstrations.   Signs and Symptoms:  -Group instruction provided by written material and verbal discussion to support subject matter. Warning signs and symptoms of infection, stroke, and heart attack are reviewed and when to call the physician/911 reinforced. Tips for preventing the spread of infection discussed.   Advanced Directives:  -Group instruction provided by verbal instruction and written material to support subject matter. Instructor reviews Advanced Directive laws and proper instruction for filling out document.   Pulmonary Video:  -Group video education that reviews the importance of medication and oxygen compliance, exercise, good nutrition, pulmonary hygiene, and pursed lip and diaphragmatic breathing for the pulmonary patient.   Exercise for the Pulmonary Patient:  -Group instruction that is supported by a PowerPoint presentation. Instructor discusses benefits of exercise, core components of exercise, frequency, duration, and intensity of an exercise routine, importance of utilizing pulse oximetry during exercise, safety while exercising, and options of places to exercise outside of rehab.     Pulmonary Medications:  -Verbally interactive group education provided by  instructor with focus on inhaled medications and proper administration.   PULMONARY REHAB CHRONIC OBSTRUCTIVE PULMONARY DISEASE from 02/04/2017 in [place]  Date  02/04/17  Educator  Pharm  Instruction Review Code  2- meets goals/outcomes      Anatomy and Physiology of the Respiratory System and Intimacy:  -Group instruction provided by PowerPoint, verbal discussion, and written material to support subject matter. Instructor reviews respiratory cycle and anatomical components of the respiratory system and their functions. Instructor also reviews differences in obstructive and restrictive respiratory diseases with examples of each. Intimacy, Sex, and Sexuality differences are reviewed with a discussion on how relationships can change when diagnosed with pulmonary disease. Common sexual concerns are reviewed.   Knowledge Questionnaire Score:   Core Components/Risk Factors/Patient Goals at Admission:     Personal Goals and Risk Factors at Admission - 01/25/17 1515      Core Components/Risk Factors/Patient Goals on Admission    Weight Management Yes   Intervention Weight Management: Develop a combined nutrition and exercise program designed to reach desired caloric intake, while maintaining appropriate intake of nutrient and fiber, sodium and fats, and appropriate energy expenditure required for the weight goal.   Expected Outcomes Short Term: Continue to assess and modify interventions until short term weight is achieved;Weight Loss: Understanding of general recommendations for a balanced deficit meal plan, which promotes 1-2 lb weight loss per week and includes a negative energy balance of [phone] kcal/d;Understanding recommendations for meals to include 15-35% energy as protein, 25-35% energy from fat, 35-60% energy from carbohydrates, less than '200mg'$  of dietary cholesterol, 20-35 gm of total fiber daily;Understanding of distribution of calorie intake  throughout the day with the consumption of 4-5 meals/snacks   Improve shortness of breath with ADL's Yes   Intervention Provide education, individualized exercise plan and daily activity instruction to help decrease symptoms of SOB with activities of daily living.   Expected Outcomes Short Term: Achieves a reduction of symptoms when performing activities of daily living.   Develop more efficient breathing techniques such as purse lipped breathing and diaphragmatic breathing; and practicing self-pacing with activity Yes   Intervention Provide education, demonstration and support about specific breathing techniuqes utilized for more efficient  breathing. Include techniques such as pursed lipped breathing, diaphragmatic breathing and self-pacing activity.   Expected Outcomes Short Term: Participant will be able to demonstrate and use breathing techniques as needed throughout daily activities.   Intervention Refer participants experiencing significant psychosocial distress to appropriate mental health specialists for further evaluation and treatment. When possible, include family members and significant others in education/counseling sessions.;Offer individual and/or small group education and counseling on adjustment to heart disease, stress management and health-related lifestyle change. Teach and support self-help strategies.   Expected Outcomes Long Term: Emotional wellbeing is indicated by absence of clinically significant psychosocial distress or social isolation.;Short Term: Participant demonstrates changes in health-related behavior, relaxation and other stress management skills, ability to obtain effective social support, and compliance with psychotropic medications if prescribed.      Core Components/Risk Factors/Patient Goals Review:      Goals and Risk Factor Review    Row Name 02/15/17 1151             Core Components/Risk Factors/Patient Goals Review   Personal Goals Review Weight  Management/Obesity;Improve shortness of breath with ADL's;Develop more efficient breathing techniques such as purse lipped breathing and diaphragmatic breathing and practicing self-pacing with activity.;Stress       Review Just started program, has only attended 1 exercise session, too early to have met any goals       Expected Outcomes Expect to be more independent with exercise and feeling more comfortable in the exercise setting          Core Components/Risk Factors/Patient Goals at Discharge (Final Review):      Goals and Risk Factor Review - 02/15/17 1151      Core Components/Risk Factors/Patient Goals Review   Personal Goals Review Weight Management/Obesity;Improve shortness of breath with ADL's;Develop more efficient breathing techniques such as purse lipped breathing and diaphragmatic breathing and practicing self-pacing with activity.;Stress   Review Just started program, has only attended 1 exercise session, too early to have met any goals   Expected Outcomes Expect to be more independent with exercise and feeling more comfortable in the exercise setting      ITP Comments:   Comments: ITP REVIEW Pt is making expected progress toward pulmonary rehab goals after completing 1 sessions. Recommend continued exercise, life style modification, education, and utilization of breathing techniques to increase stamina and strength and decrease shortness of breath with exertion.

## 2017-02-16 NOTE — Progress Notes (Signed)
Daily Session Note  Patient Details  Name: Theodore [name] MRN: [id number] Date of Birth: 07/13/1956 Referring Provider:     Pulmonary Rehab Walk Test from 01/28/2017 in [place]  Referring Provider  Dr. Lake Bells      Encounter Date: 02/16/2017  Check In:     Session Check In - 02/16/17 1522      Check-In   Location [facility]   Staff Present Su Hilt, MS, ACSM RCEP, Exercise Physiologist;Benn Tarver Ysidro Evert, RN   Physician(s) Dr. Ree Kida   Medication changes reported     No   Fall or balance concerns reported    No   Tobacco Cessation No Change   Warm-up and Cool-down Performed as group-led instruction   Resistance Training Performed Yes   VAD Patient? No     Pain Assessment   Currently in Pain? No/denies   Multiple Pain Sites No      Capillary Blood Glucose: No results found for this or any previous visit (from the past 24 hour(s)).      Exercise Prescription Changes - 02/16/17 1500      Response to Exercise   Blood Pressure (Admit) 112/70   Blood Pressure (Exercise) 114/66   Blood Pressure (Exit) 100/60   Heart Rate (Admit) 92 bpm   Heart Rate (Exercise) 125 bpm   Heart Rate (Exit) 105 bpm   Oxygen Saturation (Admit) 95 %   Oxygen Saturation (Exercise) 94 %   Oxygen Saturation (Exit) 95 %   Rating of Perceived Exertion (Exercise) 15   Perceived Dyspnea (Exercise) 4   Duration Progress to 45 minutes of aerobic exercise without signs/symptoms of physical distress   Intensity THRR unchanged     Progression   Progression Continue to progress workloads to maintain intensity without signs/symptoms of physical distress.     Resistance Training   Training Prescription Yes   Weight orangebands   Reps 10-15   Time 10 Minutes     Interval Training   Interval Training No     NuStep   Level 1   Minutes 34   METs 1.6     Track   Laps 5   Minutes 17      History  Smoking Status  . Former Smoker  .  Packs/day: 2.00  . Years: 35.00  . Types: Cigarettes  . Quit date: 01/24/2012  Smokeless Tobacco  . Never Used    Comment: Remain smoke free    Goals Met:  No report of cardiac concerns or symptoms Strength training completed today  Goals Unmet:  Not Applicable  Comments: Service time is from 1330 to 1500. Pt had to use his rescue inhaler pre exercise    Dr. Rush Farmer is Medical Director for Pulmonary Rehab at [facility].

## 2017-02-18 ENCOUNTER — Encounter (HOSPITAL_COMMUNITY)
Admission: RE | Admit: 2017-02-18 | Discharge: 2017-02-18 | Disposition: A | Discharge status: Home / Self Care | Payer: Medicare Other | Source: Ambulatory Visit | Attending: Pulmonary Disease | Admitting: Pulmonary Disease

## 2017-02-18 VITALS — Wt 218.3 lb

## 2017-02-18 DIAGNOSIS — Z87891 Personal history of nicotine dependence: Secondary | ICD-10-CM | POA: Diagnosis not present

## 2017-02-18 DIAGNOSIS — J449 Chronic obstructive pulmonary disease, unspecified: Secondary | ICD-10-CM

## 2017-02-18 NOTE — Progress Notes (Signed)
Daily Session Note  Patient Details  Name: Theodore [name] MRN: [id number] Date of Birth: Sep 16, 1956 Referring Provider:     Pulmonary Rehab Walk Test from 01/28/2017 in [place]  Referring Provider  Dr. Lake Bells      Encounter Date: 02/18/2017  Check In:     Session Check In - 02/18/17 1338      Check-In   [facility]   Staff Present Su Hilt, MS, ACSM RCEP, Exercise Physiologist;Joan Leonia Reeves, RN, BSN;Kassidi Elza Ysidro Evert, RN;Portia Rollene Rotunda, RN, BSN   Supervising physician immediately available to respond to emergencies [facility] immediately available   Physician(s) Dr. Cathlean Sauer   Medication changes reported     No   Fall or balance concerns reported    No   Tobacco Cessation No Change   Warm-up and Cool-down Performed as group-led instruction   Resistance Training Performed Yes   VAD Patient? No     Pain Assessment   Currently in Pain? No/denies   Multiple Pain Sites No      Capillary Blood Glucose: No results found for this or any previous visit (from the past 24 hour(s)).      Exercise Prescription Changes - 02/18/17 1500      Response to Exercise   Blood Pressure (Admit) 104/60   Blood Pressure (Exercise) 140/80   Blood Pressure (Exit) 100/70   Heart Rate (Admit) 88 bpm   Heart Rate (Exercise) 99 bpm   Heart Rate (Exit) 100 bpm   Oxygen Saturation (Admit) 95 %   Oxygen Saturation (Exercise) 93 %   Oxygen Saturation (Exit) 96 %   Rating of Perceived Exertion (Exercise) 13   Perceived Dyspnea (Exercise) 4   Duration Progress to 45 minutes of aerobic exercise without signs/symptoms of physical distress   Intensity THRR unchanged     Progression   Progression Continue to progress workloads to maintain intensity without signs/symptoms of physical distress.     Resistance Training   Training Prescription Yes   Weight orange bands   Reps 10-15   Time 10 Minutes     Interval Training   Interval Training No     NuStep   Level 2   Minutes 34   METs 1.8      History  Smoking Status  . Former Smoker  . Packs/day: 2.00  . Years: 35.00  . Types: Cigarettes  . Quit date: 01/24/2012  Smokeless Tobacco  . Never Used    Comment: Remain smoke free    Goals Met:  No report of cardiac concerns or symptoms Strength training completed today  Goals Unmet:  Not Applicable  Comments: Service time is from 1330 to [place]    Dr. Rush Farmer is Medical Director for Pulmonary Rehab at [facility].

## 2017-02-23 ENCOUNTER — Encounter (HOSPITAL_COMMUNITY)
Admission: RE | Admit: 2017-02-23 | Discharge: 2017-02-23 | Disposition: A | Discharge status: Home / Self Care | Payer: Medicare Other | Source: Ambulatory Visit | Attending: Pulmonary Disease | Admitting: Pulmonary Disease

## 2017-02-23 VITALS — Wt 216.5 lb

## 2017-02-23 DIAGNOSIS — J449 Chronic obstructive pulmonary disease, unspecified: Secondary | ICD-10-CM | POA: Diagnosis not present

## 2017-02-23 DIAGNOSIS — Z87891 Personal history of nicotine dependence: Secondary | ICD-10-CM | POA: Diagnosis not present

## 2017-02-23 NOTE — Progress Notes (Signed)
Daily Session Note  Patient Details  Name: Theodore [name] MRN: [id number] Date of Birth: 1956/09/20 Referring Provider:     Pulmonary Rehab Walk Test from 01/28/2017 in [place]  Referring Provider  Dr. Lake Bells      Encounter Date: 02/23/2017  Check In:     Session Check In - 02/23/17 1330      Check-In   [facility]   Staff Present Su Hilt, MS, ACSM RCEP, Exercise Physiologist;Joan Leonia Reeves, RN, BSN;Lisa Hughes, RN;Jamin Humphries Rollene Rotunda, RN, BSN   Supervising physician immediately available to respond to emergencies [facility] immediately available   Physician(s) Dr. Cathlean Sauer   Medication changes reported     No   Fall or balance concerns reported    No   Tobacco Cessation No Change   Warm-up and Cool-down Performed as group-led instruction   Resistance Training Performed Yes   VAD Patient? No     Pain Assessment   Currently in Pain? No/denies   Multiple Pain Sites No      Capillary Blood Glucose: No results found for this or any previous visit (from the past 24 hour(s)).      Exercise Prescription Changes - 02/23/17 1503      Response to Exercise   Blood Pressure (Admit) 138/80   Blood Pressure (Exercise) 131/90   Blood Pressure (Exit) 122/80   Heart Rate (Admit) 97 bpm   Heart Rate (Exercise) 132 bpm   Heart Rate (Exit) 95 bpm   Oxygen Saturation (Admit) 97 %   Oxygen Saturation (Exercise) 94 %   Oxygen Saturation (Exit) 94 %   Rating of Perceived Exertion (Exercise) 15   Perceived Dyspnea (Exercise) 4   Duration Progress to 45 minutes of aerobic exercise without signs/symptoms of physical distress   Intensity THRR unchanged     Progression   Progression Continue to progress workloads to maintain intensity without signs/symptoms of physical distress.     Resistance Training   Training Prescription Yes   Weight orange bands   Reps 10-15   Time 10 Minutes     Interval Training   Interval Training No     NuStep   Level 2   Minutes 34   METs 1.6     Track   Laps 4   Minutes 17      History  Smoking Status  . Former Smoker  . Packs/day: 2.00  . Years: 35.00  . Types: Cigarettes  . Quit date: 01/24/2012  Smokeless Tobacco  . Never Used    Comment: Remain smoke free    Goals Met:  Exercise tolerated well Queuing for purse lip breathing No report of cardiac concerns or symptoms Strength training completed today  Goals Unmet:  Not Applicable  Comments: Service time is from 1330 to 1500   Dr. Rush Farmer is Medical Director for Pulmonary Rehab at [facility].

## 2017-02-25 ENCOUNTER — Encounter (HOSPITAL_COMMUNITY)
Admission: RE | Admit: 2017-02-25 | Discharge: 2017-02-25 | Disposition: A | Discharge status: Home / Self Care | Payer: Medicare Other | Source: Ambulatory Visit | Attending: Pulmonary Disease | Admitting: Pulmonary Disease

## 2017-02-25 VITALS — Wt 215.8 lb

## 2017-02-25 DIAGNOSIS — J449 Chronic obstructive pulmonary disease, unspecified: Secondary | ICD-10-CM

## 2017-02-25 DIAGNOSIS — Z87891 Personal history of nicotine dependence: Secondary | ICD-10-CM | POA: Diagnosis not present

## 2017-02-25 NOTE — Progress Notes (Signed)
Daily Session Note  Patient Details  Name: Theodore [name] MRN: [id number] Date of Birth: Mar 03, 1956 Referring Provider:     Pulmonary Rehab Walk Test from 01/28/2017 in [place]  Referring Provider  Dr. Lake Bells      Encounter Date: 02/25/2017  Check In:     Session Check In - 02/25/17 1405      Check-In   [facility]   Staff Present Su Hilt, MS, ACSM RCEP, Exercise Physiologist;Joan Leonia Reeves, RN, Luisa Hart, RN, BSN   Supervising physician immediately available to respond to emergencies [facility] immediately available   Physician(s) Dr. Candiss Norse   Medication changes reported     No   Fall or balance concerns reported    No   Tobacco Cessation No Change   Warm-up and Cool-down Performed as group-led instruction   Resistance Training Performed Yes   VAD Patient? No     Pain Assessment   Currently in Pain? No/denies   Multiple Pain Sites No      Capillary Blood Glucose: No results found for this or any previous visit (from the past 24 hour(s)).      Exercise Prescription Changes - 02/25/17 1646      Response to Exercise   Blood Pressure (Admit) 114/60   Blood Pressure (Exercise) 126/70   Blood Pressure (Exit) 108/70   Heart Rate (Admit) 92 bpm   Heart Rate (Exercise) 118 bpm   Heart Rate (Exit) 100 bpm   Oxygen Saturation (Admit) 94 %   Oxygen Saturation (Exercise) 92 %   Oxygen Saturation (Exit) 90 %   Rating of Perceived Exertion (Exercise) 11   Perceived Dyspnea (Exercise) 4   Duration Progress to 45 minutes of aerobic exercise without signs/symptoms of physical distress   Intensity THRR unchanged     Progression   Progression Continue to progress workloads to maintain intensity without signs/symptoms of physical distress.     Resistance Training   Training Prescription Yes   Weight orange bands   Reps 10-15   Time 10 Minutes     Interval Training   Interval Training No     NuStep   Level 2   Minutes 34   METs 1.7      History  Smoking Status  . Former Smoker  . Packs/day: 2.00  . Years: 35.00  . Types: Cigarettes  . Quit date: 01/24/2012  Smokeless Tobacco  . Never Used    Comment: Remain smoke free    Goals Met:  Exercise tolerated well No report of cardiac concerns or symptoms Strength training completed today  Goals Unmet:  Not Applicable  Comments: Service time is from 1330 to 1530   Dr. Rush Farmer is Medical Director for Pulmonary Rehab at [facility].

## 2017-02-25 NOTE — Progress Notes (Addendum)
Theodore [name] presents to pulmonary rehab for his bi-weekly exercise session. I have completed his thirty day face to face review and determined that Theodore [name] is on track for meeting their pulmonary rehab goals. There are not barriers identified that will prevent them from continuing their exercise in pulmonary rehab as prescribed.   Alyson Reedy, M.D. [facility] Pulmonary/Critical Care Medicine. Pager: [phone]. After hours pager: [phone].

## 2017-03-02 ENCOUNTER — Encounter (HOSPITAL_COMMUNITY)
Admission: RE | Admit: 2017-03-02 | Discharge: 2017-03-02 | Disposition: A | Discharge status: Home / Self Care | Payer: Medicare Other | Source: Ambulatory Visit | Attending: Pulmonary Disease | Admitting: Pulmonary Disease

## 2017-03-02 VITALS — Wt 215.4 lb

## 2017-03-02 DIAGNOSIS — Z87891 Personal history of nicotine dependence: Secondary | ICD-10-CM | POA: Diagnosis not present

## 2017-03-02 DIAGNOSIS — J449 Chronic obstructive pulmonary disease, unspecified: Secondary | ICD-10-CM | POA: Diagnosis not present

## 2017-03-02 NOTE — Progress Notes (Signed)
Daily Session Note  Patient Details  Name: Theodore [name] MRN: [id number] Date of Birth: February 21, 1956 Referring Provider:     Pulmonary Rehab Walk Test from 01/28/2017 in [place]  Referring Provider  Dr. Lake Bells      Encounter Date: 03/02/2017  Check In:     Session Check In - 03/02/17 1326      Check-In   [facility]   Staff Present Su Hilt, MS, ACSM RCEP, Exercise Physiologist;Ayan Yankey Leonia Reeves, RN, Luisa Hart, RN, Roque Cash, RN   Supervising physician immediately available to respond to emergencies [facility] immediately available   Physician(s) Dr. Candiss Norse   Medication changes reported     No   Fall or balance concerns reported    No   Tobacco Cessation No Change   Warm-up and Cool-down Performed as group-led instruction   Resistance Training Performed Yes   VAD Patient? No     Pain Assessment   Currently in Pain? No/denies   Multiple Pain Sites No      Capillary Blood Glucose: No results found for this or any previous visit (from the past 24 hour(s)).      Exercise Prescription Changes - 03/02/17 1600      Response to Exercise   Blood Pressure (Admit) 114/80   Blood Pressure (Exercise) 120/62   Blood Pressure (Exit) 96/60   Heart Rate (Admit) 109 bpm   Heart Rate (Exercise) 122 bpm   Heart Rate (Exit) 112 bpm   Oxygen Saturation (Admit) 96 %   Oxygen Saturation (Exercise) 95 %   Oxygen Saturation (Exit) 94 %   Rating of Perceived Exertion (Exercise) 11   Perceived Dyspnea (Exercise) 3   Duration Progress to 45 minutes of aerobic exercise without signs/symptoms of physical distress   Intensity THRR unchanged     Progression   Progression Continue to progress workloads to maintain intensity without signs/symptoms of physical distress.     Resistance Training   Training Prescription Yes   Weight orange bands   Reps 10-15   Time 10 Minutes     Interval Training   Interval Training No     NuStep   Level 2   Minutes 34   METs 1.8     Track   Laps 6.5   Minutes 17      History  Smoking Status  . Former Smoker  . Packs/day: 2.00  . Years: 35.00  . Types: Cigarettes  . Quit date: 01/24/2012  Smokeless Tobacco  . Never Used    Comment: Remain smoke free    Goals Met:  Exercise tolerated well Strength training completed today  Goals Unmet:  Not Applicable  Comments: Service time is from 1330 to 1500    Dr. Rush Farmer is Medical Director for Pulmonary Rehab at [facility].

## 2017-03-02 NOTE — Progress Notes (Signed)
Theodore [name] 61 y.o. male  30 day Psychosocial Assessment  Patient psychosocial assessment reveals no barriers to participation in Pulmonary Rehab. Patient does not feel he is making progress toward Pulmonary Rehab goals. Patient reports his health and activity level has not improved in the past 30 days as evidenced by patient's report of not significantly changed energy level. 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 He is slow to catch on to purse lip breathing. His oxygen saturations are great, but experiences severe SOB with exertion.  Patient states family/friends have not noticed changes in his activity or mood. Patient reports  feeling positive about current and projected progression in Pulmonary Rehab. We discussed not to give up and to keep exercising with pulmonary rehab and that hopefully he will see progress in the next 30 days.   After reviewing the patient's treatment plan, the patient is making progress toward Pulmonary Rehab goals. Patient's rate of progress toward rehab goals is fair. Plan of action to help patient continue to work towards rehab goals include increasing workloads slowly as he tolerates the increase. Will continue to monitor and evaluate progress toward psychosocial goal(s).  Goal(s) in progress: Increase workloads slowly as tolerated. Help patient work toward returning to meaningful activities that improve patient's QOL and are attainable with patient's lung disease

## 2017-03-04 ENCOUNTER — Encounter (HOSPITAL_COMMUNITY)
Admission: RE | Admit: 2017-03-04 | Discharge: 2017-03-04 | Disposition: A | Discharge status: Home / Self Care | Payer: Medicare Other | Source: Ambulatory Visit | Attending: Pulmonary Disease | Admitting: Pulmonary Disease

## 2017-03-04 VITALS — Wt 217.6 lb

## 2017-03-04 DIAGNOSIS — Z87891 Personal history of nicotine dependence: Secondary | ICD-10-CM | POA: Diagnosis not present

## 2017-03-04 DIAGNOSIS — J449 Chronic obstructive pulmonary disease, unspecified: Secondary | ICD-10-CM | POA: Diagnosis not present

## 2017-03-04 NOTE — Progress Notes (Signed)
Daily Session Note  Patient Details  Name: Theodore [name] MRN: [id number] Date of Birth: Mar 09, 1956 Referring Provider:     Pulmonary Rehab Walk Test from 01/28/2017 in [place]  Referring Provider  Dr. Lake Bells      Encounter Date: 03/04/2017  Check In:     Session Check In - 03/04/17 1330      Check-In   Location [facility]   Staff Present Rosebud Poles, RN, BSN;Molly diVincenzo, MS, ACSM RCEP, Exercise Physiologist;Lisa Ysidro Evert, RN;Portia Rollene Rotunda, RN, BSN   Supervising physician immediately available to respond to emergencies [facility] immediately available   Physician(s) Dr. Posey Pronto   Medication changes reported     No   Fall or balance concerns reported    No   Tobacco Cessation No Change   Warm-up and Cool-down Performed as group-led instruction   Resistance Training Performed Yes   VAD Patient? No     Pain Assessment   Currently in Pain? No/denies   Multiple Pain Sites No      Capillary Blood Glucose: No results found for this or any previous visit (from the past 24 hour(s)).      Exercise Prescription Changes - 03/04/17 1600      Response to Exercise   Blood Pressure (Admit) 110/70   Blood Pressure (Exercise) 154/84   Blood Pressure (Exit) 100/60   Heart Rate (Admit) 102 bpm   Heart Rate (Exercise) 133 bpm   Heart Rate (Exit) 107 bpm   Oxygen Saturation (Admit) 96 %   Oxygen Saturation (Exercise) 92 %   Oxygen Saturation (Exit) 96 %   Rating of Perceived Exertion (Exercise) 14   Perceived Dyspnea (Exercise) 4   Duration Progress to 45 minutes of aerobic exercise without signs/symptoms of physical distress   Intensity THRR unchanged     Progression   Progression Continue to progress workloads to maintain intensity without signs/symptoms of physical distress.     Resistance Training   Training Prescription Yes   Weight orange bands   Reps 10-15   Time 10 Minutes     Interval Training   Interval Training No     NuStep   Level 2   Minutes 17   METs 1.6     Track   Laps 5   Minutes 17      History  Smoking Status  . Former Smoker  . Packs/day: 2.00  . Years: 35.00  . Types: Cigarettes  . Quit date: 01/24/2012  Smokeless Tobacco  . Never Used    Comment: Remain smoke free    Goals Met:  Exercise tolerated well Strength training completed today  Goals Unmet:  Not Applicable  Comments: Service time is from 1330 to 1515.  Patient attended Doctor Day lecture.    Dr. Rush Farmer is Medical Director for Pulmonary Rehab at [facility].

## 2017-03-09 ENCOUNTER — Telehealth: Payer: Self-pay | Admitting: Pulmonary Disease

## 2017-03-09 ENCOUNTER — Telehealth (HOSPITAL_COMMUNITY): Payer: Self-pay | Admitting: *Deleted

## 2017-03-09 ENCOUNTER — Encounter (HOSPITAL_COMMUNITY)
Admission: RE | Admit: 2017-03-09 | Discharge: 2017-03-09 | Disposition: A | Discharge status: Home / Self Care | Payer: Medicare Other | Source: Ambulatory Visit | Attending: Pulmonary Disease | Admitting: Pulmonary Disease

## 2017-03-09 VITALS — Wt 215.6 lb

## 2017-03-09 DIAGNOSIS — J449 Chronic obstructive pulmonary disease, unspecified: Secondary | ICD-10-CM | POA: Diagnosis not present

## 2017-03-09 DIAGNOSIS — Z87891 Personal history of nicotine dependence: Secondary | ICD-10-CM | POA: Diagnosis not present

## 2017-03-09 NOTE — Progress Notes (Signed)
Daily Session Note  Patient Details  Name: Theodore [name] MRN: [id number] Date of Birth: 06-23-1956 Referring Provider:     Pulmonary Rehab Walk Test from 01/28/2017 in [place]  Referring Provider  Dr. Lake Bells      Encounter Date: 03/09/2017  Check In:     Session Check In - 03/09/17 1330      Check-In   Location [facility]   Staff Present Rosebud Poles, RN, BSN;Molly diVincenzo, MS, ACSM RCEP, Exercise Physiologist;Portia Rollene Rotunda, RN, BSN   Supervising physician immediately available to respond to emergencies [facility] immediately available   Physician(s) Dr. Posey Pronto   Medication changes reported     No   Fall or balance concerns reported    No   Tobacco Cessation No Change   Warm-up and Cool-down Performed as group-led instruction   Resistance Training Performed Yes   VAD Patient? No     Pain Assessment   Currently in Pain? No/denies   Multiple Pain Sites No      Capillary Blood Glucose: No results found for this or any previous visit (from the past 24 hour(s)).      Exercise Prescription Changes - 03/09/17 1500      Response to Exercise   Blood Pressure (Admit) 102/60   Blood Pressure (Exercise) 146/70   Blood Pressure (Exit) 106/70   Heart Rate (Admit) 94 bpm   Heart Rate (Exercise) 121 bpm   Heart Rate (Exit) 104 bpm   Oxygen Saturation (Admit) 96 %   Oxygen Saturation (Exercise) 92 %   Oxygen Saturation (Exit) 96 %   Rating of Perceived Exertion (Exercise) 13   Perceived Dyspnea (Exercise) 3   Duration Progress to 45 minutes of aerobic exercise without signs/symptoms of physical distress   Intensity THRR unchanged     Progression   Progression Continue to progress workloads to maintain intensity without signs/symptoms of physical distress.     Resistance Training   Training Prescription Yes   Weight orange bands   Reps 10-15   Time 10 Minutes     Interval Training   Interval Training No      NuStep   Level 2   Minutes 34   METs 1.7     Track   Laps 8   Minutes 17      History  Smoking Status  . Former Smoker  . Packs/day: 2.00  . Years: 35.00  . Types: Cigarettes  . Quit date: 01/24/2012  Smokeless Tobacco  . Never Used    Comment: Remain smoke free    Goals Met:  Exercise tolerated well Strength training completed today  Goals Unmet:  Not Applicable  Comments: Service time is from 1330 to 1500    Dr. Rush Farmer is Medical Director for Pulmonary Rehab at [facility].

## 2017-03-09 NOTE — Telephone Encounter (Signed)
Spoke to [email or web address]  [email or web address] was placed on 02/08/17 for ono through [facility] and pt has not heard from them I sent a priority message to [facility] [email or web address]  to see what the hold up is Tobe Sos

## 2017-03-10 NOTE — Telephone Encounter (Signed)
[facility] states they had the pt's phone # wrong and have now updates it and will have RT call pt asap Tobe Sos

## 2017-03-10 NOTE — Telephone Encounter (Signed)
snt a 2nd message today for [facility] Tobe Sos

## 2017-03-11 ENCOUNTER — Encounter (HOSPITAL_COMMUNITY)
Admission: RE | Admit: 2017-03-11 | Discharge: 2017-03-11 | Disposition: A | Discharge status: Home / Self Care | Payer: Medicare Other | Source: Ambulatory Visit | Attending: Pulmonary Disease | Admitting: Pulmonary Disease

## 2017-03-11 VITALS — Wt 216.1 lb

## 2017-03-11 DIAGNOSIS — J449 Chronic obstructive pulmonary disease, unspecified: Secondary | ICD-10-CM

## 2017-03-11 DIAGNOSIS — Z87891 Personal history of nicotine dependence: Secondary | ICD-10-CM | POA: Diagnosis not present

## 2017-03-11 NOTE — Progress Notes (Signed)
Daily Session Note  Patient Details  Name: Theodore [name] MRN: [id number] Date of Birth: 1956-06-02 Referring Provider:     Pulmonary Rehab Walk Test from 01/28/2017 in [place]  Referring Provider  Dr. Lake Bells      Encounter Date: 03/11/2017  Check In:     Session Check In - 03/11/17 1357      Check-In   Location [facility]   Staff Present Trish Fountain, RN, BSN;Molly diVincenzo, MS, ACSM RCEP, Exercise Physiologist;Lisa Ysidro Evert, RN   Supervising physician immediately available to respond to emergencies [facility] immediately available   Physician(s) Dr. Sloan Leiter   Medication changes reported     No   Fall or balance concerns reported    No   Tobacco Cessation No Change   Warm-up and Cool-down Performed as group-led instruction   Resistance Training Performed Yes   VAD Patient? No     Pain Assessment   Currently in Pain? No/denies   Multiple Pain Sites No      Capillary Blood Glucose: No results found for this or any previous visit (from the past 24 hour(s)).      Exercise Prescription Changes - 03/11/17 1600      Response to Exercise   Blood Pressure (Admit) 110/60   Blood Pressure (Exercise) 156/80   Blood Pressure (Exit) 100/78   Heart Rate (Admit) 105 bpm   Heart Rate (Exercise) 120 bpm   Heart Rate (Exit) 99 bpm   Oxygen Saturation (Admit) 96 %   Oxygen Saturation (Exercise) 92 %   Oxygen Saturation (Exit) 95 %   Rating of Perceived Exertion (Exercise) 12   Perceived Dyspnea (Exercise) 3   Duration Progress to 45 minutes of aerobic exercise without signs/symptoms of physical distress   Intensity THRR unchanged     Progression   Progression Continue to progress workloads to maintain intensity without signs/symptoms of physical distress.     Resistance Training   Training Prescription Yes   Weight orange bands   Reps 10-15   Time 10 Minutes     Interval Training   Interval Training No     NuStep   Level 3   Minutes 17   METs 1.7     Track   Laps 10   Minutes 17      History  Smoking Status  . Former Smoker  . Packs/day: 2.00  . Years: 35.00  . Types: Cigarettes  . Quit date: 01/24/2012  Smokeless Tobacco  . Never Used    Comment: Remain smoke free    Goals Met:  Exercise tolerated well Strength training completed today  Goals Unmet:  Not Applicable  Comments: Service time is from 1330 to [place]      Dr. Rush Farmer is Medical Director for Pulmonary Rehab at [facility].

## 2017-03-16 ENCOUNTER — Encounter (HOSPITAL_COMMUNITY)
Admission: RE | Admit: 2017-03-16 | Discharge: 2017-03-16 | Disposition: A | Discharge status: Home / Self Care | Payer: Medicare Other | Source: Ambulatory Visit | Attending: Pulmonary Disease | Admitting: Pulmonary Disease

## 2017-03-16 VITALS — Wt 213.8 lb

## 2017-03-16 DIAGNOSIS — Z87891 Personal history of nicotine dependence: Secondary | ICD-10-CM | POA: Diagnosis not present

## 2017-03-16 DIAGNOSIS — J449 Chronic obstructive pulmonary disease, unspecified: Secondary | ICD-10-CM | POA: Diagnosis not present

## 2017-03-16 NOTE — Progress Notes (Signed)
Pulmonary Individual Treatment Plan  Patient Details  Name: Theodore [name] MRN: [id number] Date of Birth: 1956/05/28 Referring Provider:     Pulmonary Rehab Walk Test from 01/28/2017 in [place]  Referring Provider  Dr. Lake Bells      Initial Encounter Date:    Pulmonary Rehab Walk Test from 01/28/2017 in [place]  Date  01/28/17  Referring Provider  Dr. 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  albuterol (PROVENTIL) (2.5 MG/3ML) 0.083% nebulizer solution, Take 2.5 mg by nebulization every 6 (six) hours as needed for wheezing or shortness of breath., Disp: , Rfl:  .  albuterol (VENTOLIN HFA) 108 (90 Base) MCG/ACT inhaler, INHALE 2 PUFFS INTO THE LUNGS EVERY 4 HOURS AS NEEDED FOR SHORTNESS OF BREATH OR WHEEZING, Disp: 18 g, Rfl: 3 .  baclofen (LIORESAL) 10 MG tablet, Take 1 tablet (10 mg total) by mouth 3 (three) times daily. (Patient not taking: Reported on 01/11/2017), Disp: 30 each, Rfl: 0 .  budesonide-formoterol (SYMBICORT) 160-4.5 MCG/ACT inhaler, Inhale 2 puffs into the lungs 2 (two) times daily., Disp: 1 Inhaler, Rfl: 0 .  guaiFENesin (MUCINEX) 600 MG 12 hr tablet, Take 1,200 mg by mouth 2 (two) times daily. , Disp: , Rfl:  .  HYDROcodone-acetaminophen (NORCO) 5-325 MG tablet, Take 1 tablet by mouth every 6 (six) hours as needed for moderate pain. (Patient not taking: Reported on 01/11/2017), Disp: 20 tablet, Rfl: 0 .  Multiple Vitamin (MULTIVITAMIN WITH MINERALS) TABS tablet, Take 1 tablet by mouth 3 (three) times a week., Disp: , Rfl:  .  nicotine polacrilex (NICORETTE) 4 MG gum, Take 4 mg by mouth daily as needed for smoking cessation., Disp: , Rfl:  .  OXYGEN-HELIUM IN, Inhale 2 L into the lungs at bedtime. , Disp: , Rfl:  .  predniSONE (DELTASONE) 10 MG tablet, Take 10 mg by mouth daily with  breakfast., Disp: , Rfl:  .  rivaroxaban (XARELTO) 20 MG TABS tablet, Take 1 tablet (20 mg total) by mouth daily with supper., Disp: 30 tablet, Rfl: 6 .  sodium chloride (OCEAN) 0.65 % SOLN nasal spray, Place 1 spray into both nostrils 4 (four) times daily as needed for congestion., Disp: , Rfl:  .  SPIRIVA HANDIHALER 18 MCG inhalation capsule, INHALE THE CONTENTS OF 1 CAPSULE VIA INHALATION DEVICE EVERY DAY, Disp: 30 capsule, Rfl: 5 .  SYMBICORT 160-4.5 MCG/ACT inhaler, TAKE 2 PUFFS FIRST THING IN THE MORNING AND THEN ANOTHER 2 PUFFS ABOUT 12 HOURS LATER, Disp: 10.2 g, Rfl: 5 .  triamterene-hydrochlorothiazide (MAXZIDE-25) 37.5-25 MG tablet, TAKE 1 TABLET BY MOUTH DAILY, Disp: 30 tablet, Rfl: 5  Past Medical History: Past Medical History:  Diagnosis Date  . Anxiety   . Arthritis    "back" (01/24/2014)  . Asthma   . Chronic bronchitis ([place])   . Cluster headache    "last bout was summer 2014; had them q spring 1991-2001" (01/24/2014)  . COPD (chronic obstructive pulmonary disease) ([place])   . GERD (gastroesophageal reflux disease)   . NSTEMI (non-ST elevated myocardial infarction) ([place]) 01/2014  . On home oxygen therapy    "2L; 24/7" (01/24/2014)  . Pneumonia 06/2013; 01/2014  . Pulmonary embolism ([place]) 01/23/2014  . Seasonal allergies     Tobacco Use: History  Smoking Status  . Former Smoker  . Packs/day: 2.00  .  Years: 35.00  . Types: Cigarettes  . Quit date: 01/24/2012  Smokeless Tobacco  . Never Used    Comment: Remain smoke free    Labs: Recent Review Flowsheet Data    Labs for ITP Cardiac and Pulmonary Rehab Latest Ref Rng & Units 01/10/2014 01/11/2014 09/03/2014 03/25/2015 04/30/2016   Cholestrol 0 - 200 mg/dL - 178 231(H) - 217(H)   LDLCALC 0 - 99 mg/dL - 82 145(H) - 135(H)   HDL >39.00 mg/dL - 81 65.50 - 61.70   Trlycerides 0.0 - 149.0 mg/dL - 75 101.0 - 102.0   Hemoglobin A1c 4.6 - 6.5 % 5.9(H) - - 5.9 5.8   PHART 7.350 - 7.450 7.382 - - - -   PCO2ART 35.0 - 45.0 mmHg  34.7(L) - - - -   HCO3 20.0 - 24.0 mEq/L 20.1 - - - -   TCO2 0 - 100 mmol/L 17.6 - - - -   ACIDBASEDEF 0.0 - 2.0 mmol/L 3.7(H) - - - -   O2SAT % 95.6 - - - -      Capillary Blood Glucose: Lab Results  Component Value Date   GLUCAP 109 (H) 01/13/2014   GLUCAP 96 01/13/2014   GLUCAP 138 (H) 01/12/2014   GLUCAP 169 (H) 01/12/2014   GLUCAP 135 (H) 01/12/2014     ADL UCSD:   Pulmonary Function Assessment:     Pulmonary Function Assessment - 01/25/17 1511      Breath   Bilateral Breath Sounds Wheezes;Expiratory   Shortness of Breath Limiting activity;Yes      Exercise Target Goals:    Exercise Program Goal: Individual exercise prescription set with THRR, safety & activity barriers. Participant demonstrates ability to understand and report RPE using BORG scale, to self-measure pulse accurately, and to acknowledge the importance of the exercise prescription.  Exercise Prescription Goal: Starting with aerobic activity 30 plus minutes a day, 3 days per week for initial exercise prescription. Provide home exercise prescription and guidelines that participant acknowledges understanding prior to discharge.  Activity Barriers & Risk Stratification:     Activity Barriers & Cardiac Risk Stratification - 01/25/17 1429      Activity Barriers & Cardiac Risk Stratification   Activity Barriers Deconditioning;Shortness of Breath      6 Minute Walk:     6 Minute Walk    Row Name 01/28/17 1607         6 Minute Walk   Phase Initial     Distance 700 feet     Walk Time -  4 minutes 10 seconds total     # of Rest Breaks 1  1 minute 50 seconds     MPH 1.3     METS 2     RPE 18     Perceived Dyspnea  4     Symptoms Yes (comment)     Comments leg fatigue     Resting HR 122 bpm     Resting BP 106/70     Max Ex. HR 154 bpm     Max Ex. BP 154/90     2 Minute Post BP 154/80       Interval HR   Baseline HR 122     1 Minute HR 122     2 Minute HR 136     3 Minute HR 139      4 Minute HR 136     5 Minute HR 148     6 Minute HR 154  2 Minute Post HR 137     Interval Heart Rate? Yes       Interval Oxygen   Interval Oxygen? Yes     Baseline Oxygen Saturation % 91 %     Baseline Liters of Oxygen 0 L     1 Minute Oxygen Saturation % 91 %     1 Minute Liters of Oxygen 0 L     2 Minute Oxygen Saturation % 91 %     2 Minute Liters of Oxygen 0 L     3 Minute Oxygen Saturation % 93 %     3 Minute Liters of Oxygen 0 L     4 Minute Oxygen Saturation % 94 %     4 Minute Liters of Oxygen 0 L     5 Minute Oxygen Saturation % 92 %     5 Minute Liters of Oxygen 0 L     6 Minute Oxygen Saturation % 92 %     6 Minute Liters of Oxygen 0 L     2 Minute Post Oxygen Saturation % 95 %     2 Minute Post Liters of Oxygen 0 L        Oxygen Initial Assessment:     Oxygen Initial Assessment - 01/25/17 1428      Home Oxygen   Home Oxygen Device Home Concentrator   Sleep Oxygen Prescription Continuous   Liters per minute 2   Home Exercise Oxygen Prescription None   Home at Rest Exercise Oxygen Prescription None   Compliance with Home Oxygen Use Yes      Oxygen Re-Evaluation:     Oxygen Re-Evaluation    Row Name 02/15/17 1146 03/16/17 1607           Program Oxygen Prescription   Program Oxygen Prescription None None        Home Oxygen   Home Oxygen Device Home Concentrator None      Sleep Oxygen Prescription Continuous None      Liters per minute 2  -      Home Exercise Oxygen Prescription None None      Home at Rest Exercise Oxygen Prescription None None      Compliance with Home Oxygen Use Yes No         Oxygen Discharge (Final Oxygen Re-Evaluation):     Oxygen Re-Evaluation - 03/16/17 1607      Program Oxygen Prescription   Program Oxygen Prescription None     Home Oxygen   Home Oxygen Device None   Sleep Oxygen Prescription None   Home Exercise Oxygen Prescription None   Home at Rest Exercise Oxygen Prescription None   Compliance  with Home Oxygen Use No      Initial Exercise Prescription:     Initial Exercise Prescription - 01/28/17 1600      Date of Initial Exercise RX and Referring Provider   Date 01/28/17   Referring Provider Dr. Lake Bells     NuStep   Level 1   Minutes 34   METs 1.5     Track   Laps 3   Minutes 17     Prescription Details   Frequency (times per week) 2   Duration Progress to 45 minutes of aerobic exercise without signs/symptoms of physical distress     Intensity   THRR 40-80% of Max Heartrate 63-126   Ratings of Perceived Exertion 11-13   Perceived Dyspnea 0-4     Progression   Progression Continue  progressive overload as per policy without signs/symptoms or physical distress.     Resistance Training   Training Prescription Yes   Weight orange bands   Reps 10-15      Perform Capillary Blood Glucose checks as needed.  Exercise Prescription Changes:     Exercise Prescription Changes    Row Name 02/04/17 1200 02/16/17 1500 02/18/17 1500 02/23/17 1503 02/25/17 1646     Response to Exercise   Blood Pressure (Admit) 100/70 112/70 104/60 138/80 114/60   Blood Pressure (Exercise) 124/60 114/66 140/80 131/90 126/70   Blood Pressure (Exit) 112/70 100/60 100/70 122/80 108/70   Heart Rate (Admit) 88 bpm 92 bpm 88 bpm 97 bpm 92 bpm   Heart Rate (Exercise) 121 bpm 125 bpm 99 bpm 132 bpm 118 bpm   Heart Rate (Exit) 102 bpm 105 bpm 100 bpm 95 bpm 100 bpm   Oxygen Saturation (Admit) 97 % 95 % 95 % 97 % 94 %   Oxygen Saturation (Exercise) 95 % 94 % 93 % 94 % 92 %   Oxygen Saturation (Exit) 96 % 95 % 96 % 94 % 90 %   Rating of Perceived Exertion (Exercise) _0 Perceived Dyspnea (Exercise) _1 Duration Continue with 45 min of aerobic exercise without signs/symptoms of physical distress. Progress to 45 minutes of aerobic exercise without signs/symptoms of physical distress Progress to 45 minutes of aerobic exercise without signs/symptoms of physical distress  Progress to 45 minutes of aerobic exercise without signs/symptoms of physical distress Progress to 45 minutes of aerobic exercise without signs/symptoms of physical distress   Intensity _2      Progression   Progression Continue to progress workloads to maintain intensity without signs/symptoms of physical distress. Continue to progress workloads to maintain intensity without signs/symptoms of physical distress. Continue to progress workloads to maintain intensity without signs/symptoms of physical distress. Continue to progress workloads to maintain intensity without signs/symptoms of physical distress. Continue to progress workloads to maintain intensity without signs/symptoms of physical distress.     Resistance Training   Training Prescription _3    Weight orange bands orangebands orange bands orange bands orange bands   Reps 10-15 10-15 10-15 10-15 10-15   Time 10 Minutes 10 Minutes 10 Minutes 10 Minutes 10 Minutes     Interval Training   Interval Training _4      NuStep   Level _5 Minutes 34 34 34 34 34   METs 1.5 1.6 1.8 1.6 1.7     Track   Laps  - 5  - 4  -   Minutes  - 17  - 17  -   Row Name 03/02/17 1600 03/04/17 1600 03/09/17 1500 03/11/17 1600 03/16/17 1500     Response to Exercise   Blood Pressure (Admit) 114/80 110/70 102/60 110/60 110/64   Blood Pressure (Exercise) 120/62 154/84 146/70 156/80 110/60   Blood Pressure (Exit) 96/60 100/60 106/70 100/78 112/64   Heart Rate (Admit) 109 bpm 102 bpm 94 bpm 105 bpm 88 bpm   Heart Rate (Exercise) 122 bpm 133 bpm 121 bpm 120 bpm 134 bpm   Heart Rate (Exit) 112 bpm 107 bpm 104 bpm 99 bpm 105 bpm   Oxygen Saturation (Admit) 96 % 96 % 96 % 96 % 96 %   Oxygen Saturation (Exercise) 95 % 92 %  92 % 92 % 94 %   Oxygen Saturation (Exit) 94 % 96 % 96 % 95 % 96 %   Rating of Perceived Exertion (Exercise) _0 Perceived  Dyspnea (Exercise) _1 Duration Progress to 45 minutes of aerobic exercise without signs/symptoms of physical distress Progress to 45 minutes of aerobic exercise without signs/symptoms of physical distress Progress to 45 minutes of aerobic exercise without signs/symptoms of physical distress Progress to 45 minutes of aerobic exercise without signs/symptoms of physical distress Progress to 45 minutes of aerobic exercise without signs/symptoms of physical distress   Intensity _2      Progression   Progression Continue to progress workloads to maintain intensity without signs/symptoms of physical distress. Continue to progress workloads to maintain intensity without signs/symptoms of physical distress. Continue to progress workloads to maintain intensity without signs/symptoms of physical distress. Continue to progress workloads to maintain intensity without signs/symptoms of physical distress. Continue to progress workloads to maintain intensity without signs/symptoms of physical distress.     Resistance Training   Training Prescription _3    Weight _4    Reps 10-15 10-15 10-15 10-15 10-15   Time 10 Minutes 10 Minutes 10 Minutes 10 Minutes 10 Minutes     Interval Training   Interval Training _5      NuStep   Level _6 Minutes 34 17 34 17 34   METs 1.8 1.6 1.7 1.7 1.7     Track   Laps 6._7 Minutes _8 Exercise Comments:   Exercise Goals and Review:     Exercise Goals    Row Name 01/25/17 1431             Exercise Goals   Increase Physical Activity Yes       Intervention Provide advice, education, support and counseling about physical activity/exercise needs.;Develop an individualized exercise prescription for aerobic and resistive training based on initial evaluation findings,  risk stratification, comorbidities and participant's personal goals.       Expected Outcomes Achievement of increased cardiorespiratory fitness and enhanced flexibility, muscular endurance and strength shown through measurements of functional capacity and personal statement of participant.       Increase Strength and Stamina Yes       Intervention Provide advice, education, support and counseling about physical activity/exercise needs.;Develop an individualized exercise prescription for aerobic and resistive training based on initial evaluation findings, risk stratification, comorbidities and participant's personal goals.       Expected Outcomes Achievement of increased cardiorespiratory fitness and enhanced flexibility, muscular endurance and strength shown through measurements of functional capacity and personal statement of participant.          Exercise Goals Re-Evaluation :     Exercise Goals Re-Evaluation    Row Name 02/16/17 0752 03/15/17 1421           Exercise Goal Re-Evaluation   Exercise Goals Review Increase Physical Activity;Increase Strenth and Stamina Increase Physical Activity;Increase Strenth and Stamina      Comments Patient has only attended one exercise session. Will cont. to monitor and progress as able.  Patient is progressing slowly. Is limited by his extreme shortness of breath. 1.5-1.8 METS on stepper. Pursed lip breathing highly encouraged.  Will cont to monitor and progress as able.      Expected Outcomes Patient will cont. to increase endurance and stamina through the exercises here at rehab. He will also practice his breathing techinques to become less short of breath on exertion.  Through exercising at rehab and at home the patient will increase strength and stamina which will make ADL's easier to preform.         Discharge Exercise Prescription (Final Exercise Prescription Changes):     Exercise Prescription Changes - 03/16/17 1500      Response to Exercise    Blood Pressure (Admit) 110/64   Blood Pressure (Exercise) 110/60   Blood Pressure (Exit) 112/64   Heart Rate (Admit) 88 bpm   Heart Rate (Exercise) 134 bpm   Heart Rate (Exit) 105 bpm   Oxygen Saturation (Admit) 96 %   Oxygen Saturation (Exercise) 94 %   Oxygen Saturation (Exit) 96 %   Rating of Perceived Exertion (Exercise) 13   Perceived Dyspnea (Exercise) 3   Duration Progress to 45 minutes of aerobic exercise without signs/symptoms of physical distress   Intensity THRR unchanged     Progression   Progression Continue to progress workloads to maintain intensity without signs/symptoms of physical distress.     Resistance Training   Training Prescription Yes   Weight orange bands   Reps 10-15   Time 10 Minutes     Interval Training   Interval Training No     NuStep   Level 4   Minutes 34   METs 1.7     Track   Laps 5   Minutes 17      Nutrition:  Target Goals: Understanding of nutrition guidelines, daily intake of sodium <1523m, cholesterol <205m calories 30% from fat and 7% or less from saturated fats, daily to have 5 or more servings of fruits and vegetables.  Biometrics:     Pre Biometrics - 01/25/17 1430      Pre Biometrics   Height 6' 4.25" (1.937 m)   Weight 217 lb 2.5 oz (98.5 kg)   BMI (Calculated) 26.3   Grip Strength 30 kg       Nutrition Therapy Plan and Nutrition Goals:   Nutrition Discharge: Rate Your Plate Scores:   Nutrition Goals Re-Evaluation:   Nutrition Goals Discharge (Final Nutrition Goals Re-Evaluation):   Psychosocial: Target Goals: Acknowledge presence or absence of significant depression and/or stress, maximize coping skills, provide positive support system. Participant is able to verbalize types and ability to use techniques and skills needed for reducing stress and depression.  Initial Review & Psychosocial Screening:     Initial Psych Review & Screening - 01/25/17 1511      Initial Review   Current issues  with Current Stress Concerns   Source of Stress Concerns Chronic Illness     Family Dynamics   Good Support System? Yes     Barriers   Psychosocial barriers to participate in program There are no identifiable barriers or psychosocial needs.     Screening Interventions   Interventions Encouraged to exercise      Quality of Life Scores:   PHQ-9: Recent Review Flowsheet Data    [facility] 04/30/2016   Decreased Interest 2 0   Down, Depressed, Hopeless 3 0   PHQ - 2 Score 5 0   Altered sleeping 1 -   Tired, decreased energy 3 -   Change in appetite 0 -   Feeling bad or failure  about yourself  3 -   Trouble concentrating 0 -   Moving slowly or fidgety/restless 0 -   Suicidal thoughts 0 -   PHQ-9 Score 12 -   Difficult doing work/chores Somewhat difficult -     Interpretation of Total Score  Total Score Depression Severity:  1-4 = Minimal depression, 5-9 = Mild depression, 10-14 = Moderate depression, 15-19 = Moderately severe depression, 20-27 = Severe depression   Psychosocial Evaluation and Intervention:     Psychosocial Evaluation - 01/25/17 1515      Psychosocial Evaluation & Interventions   Continue Psychosocial Services  Follow up required by counselor      Psychosocial Re-Evaluation:     Psychosocial Re-Evaluation    Ama Name 02/15/17 1155 03/16/17 1612           Psychosocial Re-Evaluation   Current issues with Current Stress Concerns Current Stress Concerns      Comments Feels stressed due to his chronic illness, is presently seeing a counselor to help deal with these feelings. hoping PR will improve his health      Expected Outcomes Hopefully exercise will improve his quality of life by improving his strength and stamina  continue counselling and exercise      Interventions Encouraged to attend Pulmonary Rehabilitation for the exercise  is also seeing a counselor Encouraged to attend Pulmonary Rehabilitation for the exercise       Continue Psychosocial Services  Follow up required by counselor Follow up required by counselor        Initial Review   Source of Stress Concerns Chronic Illness Chronic Illness         Psychosocial Discharge (Final Psychosocial Re-Evaluation):     Psychosocial Re-Evaluation - 03/16/17 1612      Psychosocial Re-Evaluation   Current issues with Current Stress Concerns   Comments hoping PR will improve his health   Expected Outcomes continue counselling and exercise   Interventions Encouraged to attend Pulmonary Rehabilitation for the exercise   Continue Psychosocial Services  Follow up required by counselor     Initial Review   Source of Stress Concerns Chronic Illness      Education: Education Goals: Education classes will be provided on a weekly basis, covering required topics. Participant will state understanding/return demonstration of topics presented.  Learning Barriers/Preferences:     Learning Barriers/Preferences - 01/25/17 1510      Learning Barriers/Preferences   Learning Barriers None   Learning Preferences Computer/Internet;Written Material;Verbal Instruction;Individual Instruction;Group Instruction      Education Topics: Risk Factor Reduction:  -Group instruction that is supported by a PowerPoint presentation. Instructor discusses the definition of a risk factor, different risk factors for pulmonary disease, and how the heart and lungs work together.     Nutrition for Pulmonary Patient:  -Group instruction provided by PowerPoint slides, verbal discussion, and written materials to support subject matter. The instructor gives an explanation and review of healthy diet recommendations, which includes a discussion on weight management, recommendations for fruit and vegetable consumption, as well as protein, fluid, caffeine, fiber, sodium, sugar, and alcohol. Tips for eating when patients are short of breath are discussed.   Pursed Lip Breathing:  -Group  instruction that is supported by demonstration and informational handouts. Instructor discusses the benefits of pursed lip and diaphragmatic breathing and detailed demonstration on how to preform both.     Oxygen Safety:  -Group instruction provided by PowerPoint, verbal discussion, and written material to support subject matter. There is an overview  of "What is Oxygen" and "Why do we need it".  Instructor also reviews how to create a safe environment for oxygen use, the importance of using oxygen as prescribed, and the risks of noncompliance. There is a brief discussion on traveling with oxygen and resources the patient may utilize.   Oxygen Equipment:  -Group instruction provided by [facility] Staff utilizing handouts, written materials, and equipment demonstrations.   Signs and Symptoms:  -Group instruction provided by written material and verbal discussion to support subject matter. Warning signs and symptoms of infection, stroke, and heart attack are reviewed and when to call the physician/911 reinforced. Tips for preventing the spread of infection discussed.   PULMONARY REHAB CHRONIC OBSTRUCTIVE PULMONARY DISEASE from 03/11/2017 in [place]  Date  03/11/17  Educator  RN  Instruction Review Code  2- meets goals/outcomes      Advanced Directives:  -Group instruction provided by verbal instruction and written material to support subject matter. Instructor reviews Advanced Directive laws and proper instruction for filling out document.   Pulmonary Video:  -Group video education that reviews the importance of medication and oxygen compliance, exercise, good nutrition, pulmonary hygiene, and pursed lip and diaphragmatic breathing for the pulmonary patient.   PULMONARY REHAB CHRONIC OBSTRUCTIVE PULMONARY DISEASE from 03/11/2017 in [place]  Date  02/18/17  Instruction Review Code  2- meets goals/outcomes      Exercise for  the Pulmonary Patient:  -Group instruction that is supported by a PowerPoint presentation. Instructor discusses benefits of exercise, core components of exercise, frequency, duration, and intensity of an exercise routine, importance of utilizing pulse oximetry during exercise, safety while exercising, and options of places to exercise outside of rehab.     PULMONARY REHAB CHRONIC OBSTRUCTIVE PULMONARY DISEASE from 03/11/2017 in [place]  Date  02/25/17  Educator  ep  Instruction Review Code  2- meets goals/outcomes      Pulmonary Medications:  -Verbally interactive group education provided by instructor with focus on inhaled medications and proper administration.   PULMONARY REHAB CHRONIC OBSTRUCTIVE PULMONARY DISEASE from 03/11/2017 in [place]  Date  02/04/17  Educator  Pharm  Instruction Review Code  2- meets goals/outcomes      Anatomy and Physiology of the Respiratory System and Intimacy:  -Group instruction provided by PowerPoint, verbal discussion, and written material to support subject matter. Instructor reviews respiratory cycle and anatomical components of the respiratory system and their functions. Instructor also reviews differences in obstructive and restrictive respiratory diseases with examples of each. Intimacy, Sex, and Sexuality differences are reviewed with a discussion on how relationships can change when diagnosed with pulmonary disease. Common sexual concerns are reviewed.   Knowledge Questionnaire Score:   Core Components/Risk Factors/Patient Goals at Admission:     Personal Goals and Risk Factors at Admission - 01/25/17 1515      Core Components/Risk Factors/Patient Goals on Admission    Weight Management Yes   Intervention Weight Management: Develop a combined nutrition and exercise program designed to reach desired caloric intake, while maintaining appropriate intake of nutrient and fiber,  sodium and fats, and appropriate energy expenditure required for the weight goal.   Expected Outcomes Short Term: Continue to assess and modify interventions until short term weight is achieved;Weight Loss: Understanding of general recommendations for a balanced deficit meal plan, which promotes 1-2 lb weight loss per week and includes a negative energy balance of [phone]  kcal/d;Understanding recommendations for meals to include 15-35% energy as protein, 25-35% energy from fat, 35-60% energy from carbohydrates, less than 240m of dietary cholesterol, 20-35 gm of total fiber daily;Understanding of distribution of calorie intake throughout the day with the consumption of 4-5 meals/snacks   Improve shortness of breath with ADL's Yes   Intervention Provide education, individualized exercise plan and daily activity instruction to help decrease symptoms of SOB with activities of daily living.   Expected Outcomes Short Term: Achieves a reduction of symptoms when performing activities of daily living.   Develop more efficient breathing techniques such as purse lipped breathing and diaphragmatic breathing; and practicing self-pacing with activity Yes   Intervention Provide education, demonstration and support about specific breathing techniuqes utilized for more efficient breathing. Include techniques such as pursed lipped breathing, diaphragmatic breathing and self-pacing activity.   Expected Outcomes Short Term: Participant will be able to demonstrate and use breathing techniques as needed throughout daily activities.   Intervention Refer participants experiencing significant psychosocial distress to appropriate mental health specialists for further evaluation and treatment. When possible, include family members and significant others in education/counseling sessions.;Offer individual and/or small group education and counseling on adjustment to heart disease, stress management and health-related lifestyle change.  Teach and support self-help strategies.   Expected Outcomes Long Term: Emotional wellbeing is indicated by absence of clinically significant psychosocial distress or social isolation.;Short Term: Participant demonstrates changes in health-related behavior, relaxation and other stress management skills, ability to obtain effective social support, and compliance with psychotropic medications if prescribed.      Core Components/Risk Factors/Patient Goals Review:      Goals and Risk Factor Review    Row Name 02/15/17 1151 03/16/17 1608           Core Components/Risk Factors/Patient Goals Review   Personal Goals Review Weight Management/Obesity;Improve shortness of breath with ADL's;Develop more efficient breathing techniques such as purse lipped breathing and diaphragmatic breathing and practicing self-pacing with activity.;Stress Develop more efficient breathing techniques such as purse lipped breathing and diaphragmatic breathing and practicing self-pacing with activity.      Review Just started program, has only attended 1 exercise session, too early to have met any goals Has stage 4 COPD, very limited re: his ability to exercise and balance his SOB.  PLB reinforced with each session, little to no progression.  Great attendance!      Expected Outcomes Expect to be more independent with exercise and feeling more comfortable in the exercise setting Continue with plan and hope for continued very slow progress.           Core Components/Risk Factors/Patient Goals at Discharge (Final Review):      Goals and Risk Factor Review - 03/16/17 1608      Core Components/Risk Factors/Patient Goals Review   Personal Goals Review Develop more efficient breathing techniques such as purse lipped breathing and diaphragmatic breathing and practicing self-pacing with activity.   Review Has stage 4 COPD, very limited re: his ability to exercise and balance his SOB.  PLB reinforced with each session, little to  no progression.  Great attendance!   Expected Outcomes Continue with plan and hope for continued very slow progress.        ITP Comments:   Comments: ITP REVIEW Pt is making expected progress toward pulmonary rehab goals after completing 10 sessions. Recommend continued exercise, life style modification, education, and utilization of breathing techniques to increase stamina and strength and decrease shortness of breath with exertion.

## 2017-03-16 NOTE — Progress Notes (Signed)
Daily Session Note  Patient Details  Name: Theodore [name] MRN: [id number] Date of Birth: August 14, 1956 Referring Provider:     Pulmonary Rehab Walk Test from 01/28/2017 in [place]  Referring Provider  Dr. Lake Bells      Encounter Date: 03/16/2017  Check In:     Session Check In - 03/16/17 1330      Check-In   Location [facility]   Staff Present Trish Fountain, RN, Maxcine Ham, RN, BSN;Molly diVincenzo, MS, ACSM RCEP, Exercise Physiologist;Vienne Corcoran Ysidro Evert, RN   Supervising physician immediately available to respond to emergencies [facility] immediately available   Physician(s) Dr. Almetta Lovely   Medication changes reported     No   Fall or balance concerns reported    No   Tobacco Cessation No Change   Warm-up and Cool-down Performed as group-led instruction   Resistance Training Performed Yes   VAD Patient? No     Pain Assessment   Currently in Pain? No/denies   Multiple Pain Sites No      Capillary Blood Glucose: No results found for this or any previous visit (from the past 24 hour(s)).      Exercise Prescription Changes - 03/16/17 1500      Response to Exercise   Blood Pressure (Admit) 110/64   Blood Pressure (Exercise) 110/60   Blood Pressure (Exit) 112/64   Heart Rate (Admit) 88 bpm   Heart Rate (Exercise) 134 bpm   Heart Rate (Exit) 105 bpm   Oxygen Saturation (Admit) 96 %   Oxygen Saturation (Exercise) 94 %   Oxygen Saturation (Exit) 96 %   Rating of Perceived Exertion (Exercise) 13   Perceived Dyspnea (Exercise) 3   Duration Progress to 45 minutes of aerobic exercise without signs/symptoms of physical distress   Intensity THRR unchanged     Progression   Progression Continue to progress workloads to maintain intensity without signs/symptoms of physical distress.     Resistance Training   Training Prescription Yes   Weight orange bands   Reps 10-15   Time 10 Minutes     Interval Training   Interval Training No     NuStep   Level 4   Minutes 34   METs 1.7     Track   Laps 5   Minutes 17      History  Smoking Status  . Former Smoker  . Packs/day: 2.00  . Years: 35.00  . Types: Cigarettes  . Quit date: 01/24/2012  Smokeless Tobacco  . Never Used    Comment: Remain smoke free    Goals Met:  Exercise tolerated well No report of cardiac concerns or symptoms Strength training completed today  Goals Unmet:  Not Applicable  Comments: Service time is from 1330 to 1500    Dr. Rush Farmer is Medical Director for Pulmonary Rehab at [facility].

## 2017-03-18 ENCOUNTER — Encounter (HOSPITAL_COMMUNITY)
Admission: RE | Admit: 2017-03-18 | Discharge: 2017-03-18 | Disposition: A | Discharge status: Home / Self Care | Payer: Medicare Other | Source: Ambulatory Visit | Attending: Pulmonary Disease | Admitting: Pulmonary Disease

## 2017-03-18 VITALS — Wt 214.3 lb

## 2017-03-18 DIAGNOSIS — J449 Chronic obstructive pulmonary disease, unspecified: Secondary | ICD-10-CM

## 2017-03-18 DIAGNOSIS — Z87891 Personal history of nicotine dependence: Secondary | ICD-10-CM | POA: Diagnosis not present

## 2017-03-18 NOTE — Progress Notes (Signed)
Daily Session Note  Patient Details  Name: Theodore [name] MRN: [id number] Date of Birth: 10/02/1956 Referring Provider:     Pulmonary Rehab Walk Test from 01/28/2017 in [place]  Referring Provider  Dr. Lake Bells      Encounter Date: 03/18/2017  Check In:     Session Check In - 03/18/17 1330      Check-In   Location [facility]   Staff Present Rosebud Poles, RN, BSN;Lisa Ysidro Evert, RN;Tylisa Alcivar Rollene Rotunda, RN, BSN   Supervising physician immediately available to respond to emergencies [facility] immediately available   Physician(s) Dr. Wendee Beavers   Medication changes reported     No   Fall or balance concerns reported    No   Tobacco Cessation No Change   Warm-up and Cool-down Performed as group-led instruction   Resistance Training Performed Yes   VAD Patient? No     Pain Assessment   Currently in Pain? No/denies   Multiple Pain Sites No      Capillary Blood Glucose: No results found for this or any previous visit (from the past 24 hour(s)).      Exercise Prescription Changes - 03/18/17 1620      Response to Exercise   Blood Pressure (Admit) 112/60   Blood Pressure (Exit) 110/60   Heart Rate (Admit) 93 bpm   Heart Rate (Exercise) 93 bpm   Heart Rate (Exit) 95 bpm   Oxygen Saturation (Admit) 96 %   Oxygen Saturation (Exercise) 96 %   Oxygen Saturation (Exit) 94 %   Rating of Perceived Exertion (Exercise) 12   Perceived Dyspnea (Exercise) 1   Duration Progress to 45 minutes of aerobic exercise without signs/symptoms of physical distress   Intensity THRR unchanged     Progression   Progression Continue to progress workloads to maintain intensity without signs/symptoms of physical distress.     Resistance Training   Training Prescription Yes   Weight orange bands   Reps 10-15   Time 10 Minutes     Interval Training   Interval Training No     NuStep   Level 4   Minutes 17      History  Smoking Status  .  Former Smoker  . Packs/day: 2.00  . Years: 35.00  . Types: Cigarettes  . Quit date: 01/24/2012  Smokeless Tobacco  . Never Used    Comment: Remain smoke free    Goals Met:  Exercise tolerated well Queuing for purse lip breathing No report of cardiac concerns or symptoms Strength training completed today  Goals Unmet:  Not Applicable  Comments: Service time is from 1330 to 1530   Dr. Rush Farmer is Medical Director for Pulmonary Rehab at [facility].

## 2017-03-18 NOTE — Progress Notes (Addendum)
Theodore BlalockJames W [name] 61 y.o. male Nutrition Note Spoke with pt. Pt is overweight and wants to lose wt. Pt reports he has been watching his portion sizes and decreased his consumption of sugar.   Pt eats 2 meals a day. There are some ways the pt can make his eating habits healthier, which were discussed when pt's Rate Your Plate results reviewed. Pt does not avoid salty food; uses canned/ convenience food.  Pt does not add salt to food at the table. The role of sodium in lung disease reviewed with pt. Pt is a diet-controlled diabetic. Pt's CBG's are well-controlled according to his last A1c. Pt taking Prednisone and is not taking a calcium supplement at this time. Pt has recently had difficulty shopping for groceries. Alternatives to going to the grocery store discussed. Pt expressed understanding of the information reviewed via feedback method.    Lab Results  Component Value Date   HGBA1C 5.8 04/30/2016   Nutrition Diagnosis ? Food-and nutrition-related knowledge deficit related to lack of exposure to information as related to diagnosis of pulmonary disease ? Overweight related to excessive energy intake as evidenced by a BMI of 26.3  Nutrition Intervention ? Pt's individual nutrition plan and goals reviewed with pt. ? Benefits of adopting healthy eating habits discussed when pt's Rate Your Plate reviewed. ? Pt to add 600 mg Calcium with vitamin D and Magnesium with meals BID ? Pt to attend the Nutrition and Lung Disease class ? Continual client-centered nutrition education by RD, as part of interdisciplinary care.  Goal(s) 1. Pt to identify and limit food sources of sodium. 2. The pt will consider grocery shopping on-line for food when necessary so that nourishing foods are always available at home. 3. Identify food quantities necessary to achieve wt loss of  -2# per week to a goal wt loss of 6-20 lb at graduation from pulmonary rehab. Monitor and Evaluate progress toward nutrition goal with  team.   Mickle PlumbEdna Landrum Carbonell, M.Ed, RD, LDN, CDE 03/18/2017 3:26 PM

## 2017-03-19 ENCOUNTER — Other Ambulatory Visit: Payer: Self-pay | Admitting: Adult Health

## 2017-03-23 ENCOUNTER — Encounter (HOSPITAL_COMMUNITY)
Admission: RE | Admit: 2017-03-23 | Discharge: 2017-03-23 | Disposition: A | Discharge status: Home / Self Care | Payer: Medicare Other | Source: Ambulatory Visit | Attending: Pulmonary Disease | Admitting: Pulmonary Disease

## 2017-03-23 VITALS — Wt 214.7 lb

## 2017-03-23 DIAGNOSIS — Z87891 Personal history of nicotine dependence: Secondary | ICD-10-CM | POA: Diagnosis not present

## 2017-03-23 DIAGNOSIS — J449 Chronic obstructive pulmonary disease, unspecified: Secondary | ICD-10-CM

## 2017-03-23 NOTE — Progress Notes (Signed)
Daily Session Note  Patient Details  Name: Theodore [name] MRN: [id number] Date of Birth: 08-30-1956 Referring Provider:     Pulmonary Rehab Walk Test from 01/28/2017 in [place]  Referring Provider  Dr. Lake Bells      Encounter Date: 03/23/2017  Check In:     Session Check In - 03/23/17 1330      Check-In   Location [facility]   Staff Present Rosebud Poles, RN, BSN;Molly diVincenzo, MS, ACSM RCEP, Exercise Physiologist;Lisa Ysidro Evert, RN;Portia Rollene Rotunda, RN, BSN   Supervising physician immediately available to respond to emergencies [facility] immediately available   Physician(s) Dr. Wendee Beavers   Medication changes reported     No   Fall or balance concerns reported    No   Tobacco Cessation No Change   Warm-up and Cool-down Performed as group-led instruction   Resistance Training Performed Yes   VAD Patient? No     Pain Assessment   Currently in Pain? No/denies   Multiple Pain Sites No      Capillary Blood Glucose: No results found for this or any previous visit (from the past 24 hour(s)).      Exercise Prescription Changes - 03/23/17 1500      Response to Exercise   Blood Pressure (Admit) 105/73   Blood Pressure (Exercise) 120/80   Blood Pressure (Exit) 98/60   Heart Rate (Admit) 100 bpm   Heart Rate (Exercise) 136 bpm   Heart Rate (Exit) 105 bpm   Oxygen Saturation (Admit) 96 %   Oxygen Saturation (Exercise) 91 %   Oxygen Saturation (Exit) 96 %   Rating of Perceived Exertion (Exercise) 13   Perceived Dyspnea (Exercise) 2   Duration Progress to 45 minutes of aerobic exercise without signs/symptoms of physical distress   Intensity THRR unchanged     Progression   Progression Continue to progress workloads to maintain intensity without signs/symptoms of physical distress.     Resistance Training   Training Prescription Yes   Weight orange bands   Reps 10-15   Time 10 Minutes     Interval Training   Interval  Training No     NuStep   Level 4   Minutes 34   METs 2.1     Track   Laps 8   Minutes 17      History  Smoking Status  . Former Smoker  . Packs/day: 2.00  . Years: 35.00  . Types: Cigarettes  . Quit date: 01/24/2012  Smokeless Tobacco  . Never Used    Comment: Remain smoke free    Goals Met:  Exercise tolerated well Strength training completed today  Goals Unmet:  Not Applicable  Comments: Service time is from 1030 to 1500    Dr. Rush Farmer is Medical Director for Pulmonary Rehab at [facility].

## 2017-03-25 ENCOUNTER — Encounter (HOSPITAL_COMMUNITY)
Admission: RE | Admit: 2017-03-25 | Discharge: 2017-03-25 | Disposition: A | Discharge status: Home / Self Care | Payer: Medicare Other | Source: Ambulatory Visit | Attending: Pulmonary Disease | Admitting: Pulmonary Disease

## 2017-03-25 VITALS — Wt 214.5 lb

## 2017-03-25 DIAGNOSIS — J449 Chronic obstructive pulmonary disease, unspecified: Secondary | ICD-10-CM

## 2017-03-25 DIAGNOSIS — Z87891 Personal history of nicotine dependence: Secondary | ICD-10-CM | POA: Diagnosis not present

## 2017-03-25 NOTE — Progress Notes (Signed)
Daily Session Note  Patient Details  Name: Theodore [name] MRN: [id number] Date of Birth: Dec 30, 1955 Referring Provider:     Pulmonary Rehab Walk Test from 01/28/2017 in [place]  Referring Provider  Dr. Lake Bells      Encounter Date: 03/25/2017  Check In:     Session Check In - 03/25/17 1343      Check-In   Location [facility]   Staff Present Su Hilt, MS, ACSM RCEP, Exercise Physiologist;[place] [facility] Leonia Reeves, RN, Roque Cash, RN   Supervising physician immediately available to respond to emergencies [facility] immediately available   Physician(s) Dr. Broadus [name]   Medication changes reported     No   Fall or balance concerns reported    No   Tobacco Cessation No Change   Warm-up and Cool-down Performed as group-led instruction   Resistance Training Performed Yes   VAD Patient? No     Pain Assessment   Currently in Pain? No/denies   Multiple Pain Sites No      Capillary Blood Glucose: No results found for this or any previous visit (from the past 24 hour(s)).      Exercise Prescription Changes - 03/25/17 1500      Response to Exercise   Blood Pressure (Admit) 119/77   Blood Pressure (Exercise) 116/76   Blood Pressure (Exit) 110/70   Heart Rate (Admit) 98 bpm   Heart Rate (Exercise) 110 bpm   Heart Rate (Exit) 97 bpm   Oxygen Saturation (Admit) 94 %   Oxygen Saturation (Exercise) 93 %   Oxygen Saturation (Exit) 96 %   Rating of Perceived Exertion (Exercise) 13   Perceived Dyspnea (Exercise) 3   Duration Progress to 45 minutes of aerobic exercise without signs/symptoms of physical distress   Intensity THRR unchanged     Progression   Progression Continue to progress workloads to maintain intensity without signs/symptoms of physical distress.     Resistance Training   Training Prescription Yes   Weight orange bands   Reps 10-15   Time 10 Minutes     Interval Training   Interval Training No     NuStep   Level 4   Minutes 34   METs 2      History  Smoking Status  . Former Smoker  . Packs/day: 2.00  . Years: 35.00  . Types: Cigarettes  . Quit date: 01/24/2012  Smokeless Tobacco  . Never Used    Comment: Remain smoke free    Goals Met:  Exercise tolerated well Strength training completed today  Goals Unmet:  Not Applicable  Comments: Service time is from 1330 to 1510.    Dr. Rush Farmer is Medical Director for Pulmonary Rehab at [facility].

## 2017-03-29 ENCOUNTER — Ambulatory Visit: Payer: Self-pay | Admitting: Pulmonary Disease

## 2017-03-30 ENCOUNTER — Encounter: Payer: Self-pay | Admitting: Pulmonary Disease

## 2017-03-30 ENCOUNTER — Encounter (HOSPITAL_COMMUNITY)
Admission: RE | Admit: 2017-03-30 | Discharge: 2017-03-30 | Disposition: A | Discharge status: Home / Self Care | Payer: Medicare Other | Source: Ambulatory Visit | Attending: Pulmonary Disease | Admitting: Pulmonary Disease

## 2017-03-30 ENCOUNTER — Ambulatory Visit (INDEPENDENT_AMBULATORY_CARE_PROVIDER_SITE_OTHER): Payer: Medicare Other | Admitting: Pulmonary Disease

## 2017-03-30 VITALS — Wt 211.9 lb

## 2017-03-30 VITALS — BP 120/66 | HR 107 | Ht 76.0 in | Wt 210.8 lb

## 2017-03-30 DIAGNOSIS — J449 Chronic obstructive pulmonary disease, unspecified: Secondary | ICD-10-CM

## 2017-03-30 DIAGNOSIS — Z87891 Personal history of nicotine dependence: Secondary | ICD-10-CM | POA: Diagnosis not present

## 2017-03-30 DIAGNOSIS — J9610 Chronic respiratory failure, unspecified whether with hypoxia or hypercapnia: Secondary | ICD-10-CM | POA: Diagnosis not present

## 2017-03-30 DIAGNOSIS — I4891 Unspecified atrial fibrillation: Secondary | ICD-10-CM | POA: Diagnosis not present

## 2017-03-30 DIAGNOSIS — I270 Primary pulmonary hypertension: Secondary | ICD-10-CM

## 2017-03-30 MED ORDER — PREDNISONE 10 MG PO TABS: ORAL_TABLET | ORAL | 0 refills | Status: DC

## 2017-03-30 NOTE — Progress Notes (Signed)
Subjective:    Patient ID: Theodore [name], male    DOB: 12-20-1955, 61 y.o.   MRN: [id number]  Synopsis: Has stage IV COPD (steroid dependent) and history of a pulmonary embolism after a hospitalization in 2015 formerly followed by Dr. Sherene SiresWert. Dr. Sherene SiresWert summarized his situation as follows: - PFT's 12/02/2011  FEV1  0.94 (24%) ratio 39 -  18% response to B 2 and DLCO 60%  -Alpha 1 genotype sent 03/01/12 >  MM  -07/14/2013 Med calendar , 10/24/2013  - self titration of steroids with ceiling of pred 20/ floor of zero rec 11/07/13 > did not follow action plan 12/11/2013 > repeated same 01/19/14  - 08/06/2014 p extensive coaching HFA effectiveness =    90%  - Smoked 3 packs a day at the most, decreased to 1 ppd for a "few years", he quit altogether since 2015 -second lifetime pulmomary embolism in January 2018  HPI Chief Complaint  Patient presents with  . Follow-up    pt c/o increased sob with exertion.  states pulm rehab urged him to come in for further evaluation.  also notes a prod cough with white mucus.     Theodore [name] says that he is not having a good day today.  He can't walk as far without getting short of breath.  His chest congestion was worse earlier in the month, but lately he has been less congested but more short of breath.  He doesn't wheeze too much.  No chest tightness or pain.  No fever or chills.  He has more head congestion than normal.  This has been going on for a few weeks.  He started flonase again recently and its not helping much.  No sick contacts.    He still has all his medicines Symbicort, Spiriva.  He is taking them all.  He is having a hard time getting his albuterol refilled.    His leg swelling has improved.    He has been slowly decreasing his prednisone by 1mg  a week.  He is on day 3-4 8mg .  He was told that he may have Afib while in pulmonary rehab. He says that he will be exercising and then note that his pulse will increase and is associated with increasing dyspnea.     Past Medical History:  Diagnosis Date  . Anxiety   . Arthritis    "back" (01/24/2014)  . Asthma   . Chronic bronchitis (HCC)   . Cluster headache    "last bout was summer 2014; had them q spring 1991-2001" (01/24/2014)  . COPD (chronic obstructive pulmonary disease) (HCC)   . GERD (gastroesophageal reflux disease)   . NSTEMI (non-ST elevated myocardial infarction) (HCC) 01/2014  . On home oxygen therapy    "2L; 24/7" (01/24/2014)  . Pneumonia 06/2013; 01/2014  . Pulmonary embolism (HCC) 01/23/2014  . Seasonal allergies       Review of Systems  Constitutional: Positive for fatigue. Negative for chills and fever.  HENT: Negative for postnasal drip, rhinorrhea and sinus pressure.   Respiratory: Positive for chest tightness, shortness of breath and wheezing.   Cardiovascular: Positive for leg swelling. Negative for chest pain and palpitations.       Objective:   Physical Exam  Vitals:   03/30/17 1613  BP: 120/66  Pulse: (!) 107  SpO2: 96%  Weight: 210 lb 12.8 oz (95.6 kg)  Height: 6\' 4"  (1.93 m)   RA  O2 saturation measured while ambulating on room air, walked 125 feet  O2 saturation went to 92%, no lower  Gen: chronicallyill appearing HENT: OP clear, TM's clear, neck supple PULM: Wheezing bialterally B, normal percussion CV: RRR, no mgr, trace edema GI: BS+, soft, nontender Derm: no cyanosis or rash Psyche: normal mood and affect   Records from pulmonary rehabilitation reviewed were he was evaluated for wheezing and cough and referred to our clinic  January 2018 echocardiogram reviewed showing a normal LVEF but mild pulmonary hypertension     Assessment & Plan:  Impression: Severe COPD Recurrent exacerbations of COPD Prednisone dependence Possible atrial fibrillation Recurrent pulmonary emboli  Discussion: Slayde has recurrent severe COPD exacerbations and is having another one again today. It's unclear to me why he's having persistent exacerbations. We are  unable to reduce the dose of prednisone lower than 10 mg daily. I would like to consider adding daily azithromycin but I'm not comfortable doing this with the possibility of atrial fibrillation (seen recently well exercising).  For his recurrent pulmonary emboli he will need to remain on lifelong anticoagulation.  For your COPD exacerbation: We will prescribe a prednisone taper Keep taking Symbicort and Spiriva On your next visit we may consider adding daily azithromycin Acute finish the prednisone taper take 10 mg of prednisone daily until you see [place] next  For fatigue: We will check an overnight oxygen test  For the possible A. fib, abnormal heart rhythm: We will repeat an echocardiogram We will refer you to Dr. 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

## 2017-03-30 NOTE — Patient Instructions (Addendum)
For your COPD exacerbation: We will prescribe a prednisone taper Keep taking Symbicort and Spiriva On your next visit we may consider adding daily azithromycin Acute finish the prednisone taper take 10 mg of prednisone daily until you see us next  For fatigue: We will check an overnight oxygen test  For the possible A. fib, abnormal heart rhythm: We will repeat an echocardiogram We will refer you to Dr. 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

## 2017-03-30 NOTE — Progress Notes (Addendum)
Daily Session Note  Patient Details  Name: Theodore [name] MRN: [id number] Date of Birth: 1956-09-20 Referring Provider:     Pulmonary Rehab Walk Test from 01/28/2017 in [place]  Referring Provider  Dr. Lake Bells      Encounter Date: 03/30/2017  Check In:     Session Check In - 03/30/17 1330      Check-In   Location [facility]   Staff Present Rosebud Poles, RN, BSN;Molly diVincenzo, MS, ACSM RCEP, Exercise Physiologist;Lisa Ysidro Evert, RN;Portia Rollene Rotunda, RN, BSN   Supervising physician immediately available to respond to emergencies [facility] immediately available   Physician(s) Dr. Broadus [name]   Medication changes reported     No   Fall or balance concerns reported    No   Tobacco Cessation No Change   Warm-up and Cool-down Performed as group-led instruction   Resistance Training Performed Yes   VAD Patient? No     Pain Assessment   Currently in Pain? No/denies   Multiple Pain Sites No      Capillary Blood Glucose: No results found for this or any previous visit (from the past 24 hour(s)).      Exercise Prescription Changes - 03/30/17 1500      Response to Exercise   Blood Pressure (Admit) 129/71   Blood Pressure (Exercise) 122/70   Blood Pressure (Exit) 100/70   Heart Rate (Admit) 92 bpm   Heart Rate (Exercise) 118 bpm   Heart Rate (Exit) 95 bpm   Oxygen Saturation (Admit) 95 %   Oxygen Saturation (Exercise) 92 %   Oxygen Saturation (Exit) 95 %   Rating of Perceived Exertion (Exercise) 15   Perceived Dyspnea (Exercise) 3   Duration Progress to 45 minutes of aerobic exercise without signs/symptoms of physical distress   Intensity THRR unchanged     Progression   Progression Continue to progress workloads to maintain intensity without signs/symptoms of physical distress.     Resistance Training   Training Prescription Yes   Weight orange bands   Reps 10-15   Time 10 Minutes     Interval Training   Interval Training No     NuStep   Level 4   Minutes 34   METs 2.1     Track   Laps 3   Minutes 17      History  Smoking Status  . Former Smoker  . Packs/day: 2.00  . Years: 35.00  . Types: Cigarettes  . Quit date: 01/24/2012  Smokeless Tobacco  . Never Used    Comment: Remain smoke free    Goals Met:  Exercise tolerated well Strength training completed today  Goals Unmet: None unmet Service time is from 1400 to 1500.  Face to face done with Dr. Nelda Marseille, pulmonary rehab medical director prior to service time.  Dr. Rush Farmer is Medical Director for Pulmonary Rehab at [facility].

## 2017-03-30 NOTE — Progress Notes (Signed)
Len BlalockJames W Maxon presents to pulmonary rehab for his bi-weekly exercise session. I have completed his thirty day face to face review and determined that Lind GuestJames W Etheredge is on track for meeting their pulmonary rehab goals. There are no barriers identified that will prevent them from continuing their exercise in pulmonary rehab as prescribed.   Alyson ReedyWesam G. Yacoub, M.D. [facility]. Pager: [phone]. After hours pager: [phone].

## 2017-04-01 ENCOUNTER — Encounter (HOSPITAL_COMMUNITY)
Admission: RE | Admit: 2017-04-01 | Discharge: 2017-04-01 | Disposition: A | Discharge status: Home / Self Care | Payer: Medicare Other | Source: Ambulatory Visit | Attending: Pulmonary Disease | Admitting: Pulmonary Disease

## 2017-04-01 DIAGNOSIS — J449 Chronic obstructive pulmonary disease, unspecified: Secondary | ICD-10-CM

## 2017-04-01 NOTE — Progress Notes (Signed)
Theodore BlalockJames W [name] 61 y.o. male  4260 day Psychosocial Note  Patient psychosocial assessment reveals  barriers to participation in Pulmonary Rehab. Psychosocial areas  that are currently affecting patient's progress is his health.  He has regularly participated in the pulmonary rehab exercise sessions for 60 days and still is experiencing severe shortness of breath with any activity.  He had a office visit with Dr. Kendrick [name] 2 days prior to this note and he has referred him to Dr. Rennis [name] for a workup to see if there is a cardiac issue that is contributing to his severe shortness of breath along with his stage 4 COPD.   Patient does continue to exhibit positive coping skills to deal with his psychosocial concerns. Offered emotional support and reassurance. Patient does feel he is making very slow progress toward Pulmonary Rehab goals. Patient reports his health and activity level has not improved in the past 30 days as evidenced by patient's report of not significantly changed ability to do any physical activity without extreme shortness of breath. Patient states family/friends have not noticed changes in his activity or mood. Patient reports  feeling positive about current and projected progression in Pulmonary Rehab. After reviewing the patient's treatment plan, the patient is making progress toward Pulmonary Rehab goals. Patient's rate of progress toward rehab goals is poor. Plan of action to help patient continue to work towards rehab goals include referral to his cardiologist to see if there is a cardiac component related to his shortness of breath. Will continue to monitor and evaluate progress toward psychosocial goal(s).  Goal(s) in progress: Improved coping skills Help patient work toward returning to meaningful activities that improve patient's QOL and are attainable with patient's lung disease Increase workloads as tolerated to increase

## 2017-04-06 ENCOUNTER — Encounter (HOSPITAL_COMMUNITY): Payer: Medicare Other

## 2017-04-08 ENCOUNTER — Encounter (HOSPITAL_COMMUNITY): Admission: RE | Admit: 2017-04-08 | Payer: Medicare Other | Source: Ambulatory Visit

## 2017-04-12 ENCOUNTER — Ambulatory Visit (HOSPITAL_COMMUNITY): Payer: Medicare Other | Attending: Cardiovascular Disease

## 2017-04-12 ENCOUNTER — Other Ambulatory Visit: Payer: Self-pay

## 2017-04-12 DIAGNOSIS — I272 Pulmonary hypertension, unspecified: Secondary | ICD-10-CM | POA: Diagnosis not present

## 2017-04-12 DIAGNOSIS — I270 Primary pulmonary hypertension: Secondary | ICD-10-CM

## 2017-04-12 LAB — ECHOCARDIOGRAM COMPLETE
AOASC: 37 cm
AVLVOTPG: 5 mmHg
Area-P 1/2: 2.59 cm2
E decel time: 289 msec
FS: 38 % (ref 28–44)
IV/PV OW: 1
LA ID, A-P, ES: 32 mm
LA diam index: 1.41 cm/m2
LA vol A4C: 27.1 ml
LA vol index: 13.9 mL/m2
LA vol: 31.5 mL
LDCA: 3.8 cm2
LEFT ATRIUM END SYS DIAM: 32 mm
LVOT SV: 73 mL
LVOT VTI: 19.3 cm
LVOT diameter: 22 mm
LVOTPV: 113 cm/s
MV Dec: 289
MV pk A vel: 66.8 m/s
MVPG: 3 mmHg
MVPKEVEL: 88 m/s
P 1/2 time: 85 ms
PW: 10 mm — AB (ref 0.6–1.1)
RV TAPSE: 24.9 mm
Reg peak vel: 267 cm/s
TR max vel: 267 cm/s

## 2017-04-13 ENCOUNTER — Encounter (HOSPITAL_COMMUNITY)
Admission: RE | Admit: 2017-04-13 | Discharge: 2017-04-13 | Disposition: A | Discharge status: Home / Self Care | Payer: Medicare Other | Source: Ambulatory Visit | Attending: Pulmonary Disease | Admitting: Pulmonary Disease

## 2017-04-13 DIAGNOSIS — J449 Chronic obstructive pulmonary disease, unspecified: Secondary | ICD-10-CM | POA: Insufficient documentation

## 2017-04-13 DIAGNOSIS — Z87891 Personal history of nicotine dependence: Secondary | ICD-10-CM | POA: Insufficient documentation

## 2017-04-14 ENCOUNTER — Telehealth: Payer: Self-pay | Admitting: Pulmonary Disease

## 2017-04-14 ENCOUNTER — Ambulatory Visit: Payer: Self-pay | Admitting: Pulmonary Disease

## 2017-04-14 NOTE — Telephone Encounter (Signed)
A,  Please let the patient know this showed some blood pressure elevation in her lungs due to his COPD, f/u with cardiology regarding Afib.  Thanks,  B  Spoke with pt and notified of results per Dr. Kendrick FriesMcQuaid Pt verbalized understanding and denied any questions.

## 2017-04-14 NOTE — Progress Notes (Signed)
Pt notified of results

## 2017-04-15 ENCOUNTER — Encounter (HOSPITAL_COMMUNITY)
Admission: RE | Admit: 2017-04-15 | Discharge: 2017-04-15 | Disposition: A | Discharge status: Home / Self Care | Payer: Medicare Other | Source: Ambulatory Visit | Attending: Pulmonary Disease | Admitting: Pulmonary Disease

## 2017-04-15 DIAGNOSIS — J449 Chronic obstructive pulmonary disease, unspecified: Secondary | ICD-10-CM

## 2017-04-15 NOTE — Progress Notes (Signed)
Pulmonary Individual Treatment Plan  Patient Details  Name: Theodore [name] MRN: [id number] Date of Birth: [birth year] Referring Provider:     Pulmonary Rehab Walk Test from 01/28/2017 in [place]  Referring Provider  Dr. Lake Bells      Initial Encounter Date:    Pulmonary Rehab Walk Test from 01/28/2017 in [facility]  Date  01/28/17  Referring Provider  Dr. Lake Bells      Visit Diagnosis: Stage 4 very severe COPD by GOLD classification ([place])  Patient's Home Medications on Admission:   Current Outpatient Prescriptions:  .  albuterol (PROVENTIL) (2.5 MG/3ML) 0.083% nebulizer solution, Take 2.5 mg by nebulization every 6 (six) hours as needed for wheezing or shortness of breath., Disp: , Rfl:  .  albuterol (VENTOLIN HFA) 108 (90 Base) MCG/ACT inhaler, INHALE 2 PUFFS INTO THE LUNGS EVERY 4 HOURS AS NEEDED FOR SHORTNESS OF BREATH OR WHEEZING, Disp: 18 g, Rfl: 3 .  baclofen (LIORESAL) 10 MG tablet, Take 1 tablet (10 mg total) by mouth 3 (three) times daily., Disp: 30 each, Rfl: 0 .  guaiFENesin (MUCINEX) 600 MG 12 hr tablet, Take 1,200 mg by mouth 2 (two) times daily. , Disp: , Rfl:  .  HYDROcodone-acetaminophen (NORCO) 5-325 MG tablet, Take 1 tablet by mouth every 6 (six) hours as needed for moderate pain., Disp: 20 tablet, Rfl: 0 .  Multiple Vitamin (MULTIVITAMIN WITH MINERALS) TABS tablet, Take 1 tablet by mouth 3 (three) times a week., Disp: , Rfl:  .  nicotine polacrilex (NICORETTE) 4 MG gum, Take 4 mg by mouth daily as needed for smoking cessation., Disp: , Rfl:  .  OXYGEN-HELIUM IN, Inhale 2 L into the lungs at bedtime. , Disp: , Rfl:  .  predniSONE (DELTASONE) 10 MG tablet, Take 10 mg by mouth daily with breakfast., Disp: , Rfl:  .  predniSONE (DELTASONE) 10 MG tablet, 40mX2 days, 380mX2 days, 2046m2 days, then remain at 47m54mily., Disp: 50 tablet, Rfl: 0 .  rivaroxaban (XARELTO) 20 MG TABS tablet, Take 1 tablet (20  mg total) by mouth daily with supper., Disp: 30 tablet, Rfl: 6 .  sodium chloride (OCEAN) 0.65 % SOLN nasal spray, Place 1 spray into both nostrils 4 (four) times daily as needed for congestion., Disp: , Rfl:  .  SPIRIVA HANDIHALER 18 MCG inhalation capsule, INHALE THE CONTENTS OF 1 CAPSULE VIA INHALATION DEVICE EVERY DAY, Disp: 30 capsule, Rfl: 5 .  SYMBICORT 160-4.5 MCG/ACT inhaler, TAKE 2 PUFFS FIRST THING IN THE MORNING AND THEN ANOTHER 2 PUFFS ABOUT 12 HOURS LATER, Disp: 10.2 g, Rfl: 5 .  triamterene-hydrochlorothiazide (MAXZIDE-25) 37.5-25 MG tablet, TAKE 1 TABLET BY MOUTH DAILY, Disp: 30 tablet, Rfl: 5  Past Medical History: Past Medical History:  Diagnosis Date  . Anxiety   . Arthritis    "back" (01/24/2014)  . Asthma   . Chronic bronchitis (HCC)[place]. Cluster headache    "last bout was summer 2014; had them q spring 1991-2001" (01/24/2014)  . COPD (chronic obstructive pulmonary disease) (HCC)[place]. GERD (gastroesophageal reflux disease)   . NSTEMI (non-ST elevated myocardial infarction) (HCC)[place]2015  . On home oxygen therapy    "2L; 24/7" (01/24/2014)  . Pneumonia 06/2013; 01/2014  . Pulmonary embolism (HCC)[place]17/2015  . Seasonal allergies     Tobacco Use: History  Smoking Status  . Former Smoker  . Packs/day: 2.00  . Years: 35.00  . Types: Cigarettes  .  Quit date: 01/24/2012  Smokeless Tobacco  . Never Used    Comment: Remain smoke free    Labs: Recent Review Flowsheet Data    Labs for ITP Cardiac and Pulmonary Rehab Latest Ref Rng & Units 01/10/2014 01/11/2014 09/03/2014 03/25/2015 04/30/2016   Cholestrol 0 - 200 mg/dL - 178 231(H) - 217(H)   LDLCALC 0 - 99 mg/dL - 82 145(H) - 135(H)   HDL >39.00 mg/dL - 81 65.50 - 61.70   Trlycerides 0.0 - 149.0 mg/dL - 75 101.0 - 102.0   Hemoglobin A1c 4.6 - 6.5 % 5.9(H) - - 5.9 5.8   PHART 7.350 - 7.450 7.382 - - - -   PCO2ART 35.0 - 45.0 mmHg 34.7(L) - - - -   HCO3 20.0 - 24.0 mEq/L 20.1 - - - -   TCO2 0 - 100 mmol/L 17.6 - - - -    ACIDBASEDEF 0.0 - 2.0 mmol/L 3.7(H) - - - -   O2SAT % 95.6 - - - -      Capillary Blood Glucose: Lab Results  Component Value Date   GLUCAP 109 (H) 01/13/2014   GLUCAP 96 01/13/2014   GLUCAP 138 (H) 01/12/2014   GLUCAP 169 (H) 01/12/2014   GLUCAP 135 (H) 01/12/2014     ADL [facility]:     Pulmonary Assessment Scores    Row Name 04/14/17 1511         ADL [facility]   ADL Phase Entry     SOB Score total 92       CAT Score   CAT Score 33  Entry        Pulmonary Function Assessment:     Pulmonary Function Assessment - 01/25/17 1511      Breath   Bilateral Breath Sounds Wheezes;Expiratory   Shortness of Breath Limiting activity;Yes      Exercise Target Goals:    Exercise Program Goal: Individual exercise prescription set with THRR, safety & activity barriers. Participant demonstrates ability to understand and report RPE using BORG scale, to self-measure pulse accurately, and to acknowledge the importance of the exercise prescription.  Exercise Prescription Goal: Starting with aerobic activity 30 plus minutes a day, 3 days per week for initial exercise prescription. Provide home exercise prescription and guidelines that participant acknowledges understanding prior to discharge.  Activity Barriers & Risk Stratification:     Activity Barriers & Cardiac Risk Stratification - 01/25/17 1429      Activity Barriers & Cardiac Risk Stratification   Activity Barriers Deconditioning;Shortness of Breath      6 Minute Walk:     6 Minute Walk    Row Name 01/28/17 1607         6 Minute Walk   Phase Initial     Distance 700 feet     Walk Time -  4 minutes 10 seconds total     # of Rest Breaks 1  1 minute 50 seconds     MPH 1.3     METS 2     RPE 18     Perceived Dyspnea  4     Symptoms Yes (comment)     Comments leg fatigue     Resting HR 122 bpm     Resting BP 106/70     Max Ex. HR 154 bpm     Max Ex. BP 154/90     2 Minute Post BP 154/80       Interval HR    Baseline HR 122  1 Minute HR 122     2 Minute HR 136     3 Minute HR 139     4 Minute HR 136     5 Minute HR 148     6 Minute HR 154     2 Minute Post HR 137     Interval Heart Rate? Yes       Interval Oxygen   Interval Oxygen? Yes     Baseline Oxygen Saturation % 91 %     Baseline Liters of Oxygen 0 L     1 Minute Oxygen Saturation % 91 %     1 Minute Liters of Oxygen 0 L     2 Minute Oxygen Saturation % 91 %     2 Minute Liters of Oxygen 0 L     3 Minute Oxygen Saturation % 93 %     3 Minute Liters of Oxygen 0 L     4 Minute Oxygen Saturation % 94 %     4 Minute Liters of Oxygen 0 L     5 Minute Oxygen Saturation % 92 %     5 Minute Liters of Oxygen 0 L     6 Minute Oxygen Saturation % 92 %     6 Minute Liters of Oxygen 0 L     2 Minute Post Oxygen Saturation % 95 %     2 Minute Post Liters of Oxygen 0 L        Oxygen Initial Assessment:     Oxygen Initial Assessment - 01/25/17 1428      Home Oxygen   Home Oxygen Device Home Concentrator   Sleep Oxygen Prescription Continuous   Liters per minute 2   Home Exercise Oxygen Prescription None   Home at Rest Exercise Oxygen Prescription None   Compliance with Home Oxygen Use Yes      Oxygen Re-Evaluation:     Oxygen Re-Evaluation    Row Name 02/15/17 1146 03/16/17 1607 04/15/17 0907         Program Oxygen Prescription   Program Oxygen Prescription None None None       Home Oxygen   Home Oxygen Device Home Concentrator None None     Sleep Oxygen Prescription Continuous None None     Liters per minute 2  -  -     Home Exercise Oxygen Prescription None None None     Home at Rest Exercise Oxygen Prescription None None None     Compliance with Home Oxygen Use Yes No  -        Oxygen Discharge (Final Oxygen Re-Evaluation):     Oxygen Re-Evaluation - 04/15/17 0907      Program Oxygen Prescription   Program Oxygen Prescription None     Home Oxygen   Home Oxygen Device None   Sleep Oxygen  Prescription None   Home Exercise Oxygen Prescription None   Home at Rest Exercise Oxygen Prescription None      Initial Exercise Prescription:     Initial Exercise Prescription - 01/28/17 1600      Date of Initial Exercise RX and Referring Provider   Date 01/28/17   Referring Provider Dr. Lake Bells     NuStep   Level 1   Minutes 34   METs 1.5     Track   Laps 3   Minutes 17     Prescription Details   Frequency (times per week) 2   Duration Progress to 45 minutes  of aerobic exercise without signs/symptoms of physical distress     Intensity   THRR 40-80% of Max Heartrate 63-126   Ratings of Perceived Exertion 11-13   Perceived Dyspnea 0-4     Progression   Progression Continue progressive overload as per policy without signs/symptoms or physical distress.     Resistance Training   Training Prescription Yes   Weight orange bands   Reps 10-15      Perform Capillary Blood Glucose checks as needed.  Exercise Prescription Changes:     Exercise Prescription Changes    Row Name 02/04/17 1200 02/16/17 1500 02/18/17 1500 02/23/17 1503 02/25/17 1646     Response to Exercise   Blood Pressure (Admit) 100/70 112/70 104/60 138/80 114/60   Blood Pressure (Exercise) 124/60 114/66 140/80 131/90 126/70   Blood Pressure (Exit) 112/70 100/60 100/70 122/80 108/70   Heart Rate (Admit) 88 bpm 92 bpm 88 bpm 97 bpm 92 bpm   Heart Rate (Exercise) 121 bpm 125 bpm 99 bpm 132 bpm 118 bpm   Heart Rate (Exit) 102 bpm 105 bpm 100 bpm 95 bpm 100 bpm   Oxygen Saturation (Admit) 97 % 95 % 95 % 97 % 94 %   Oxygen Saturation (Exercise) 95 % 94 % 93 % 94 % 92 %   Oxygen Saturation (Exit) 96 % 95 % 96 % 94 % 90 %   Rating of Perceived Exertion (Exercise) _0 Perceived Dyspnea (Exercise) _1 Duration Continue with 45 min of aerobic exercise without signs/symptoms of physical distress. Progress to 45 minutes of aerobic exercise without signs/symptoms of physical distress  Progress to 45 minutes of aerobic exercise without signs/symptoms of physical distress Progress to 45 minutes of aerobic exercise without signs/symptoms of physical distress Progress to 45 minutes of aerobic exercise without signs/symptoms of physical distress   Intensity _2      Progression   Progression Continue to progress workloads to maintain intensity without signs/symptoms of physical distress. Continue to progress workloads to maintain intensity without signs/symptoms of physical distress. Continue to progress workloads to maintain intensity without signs/symptoms of physical distress. Continue to progress workloads to maintain intensity without signs/symptoms of physical distress. Continue to progress workloads to maintain intensity without signs/symptoms of physical distress.     Resistance Training   Training Prescription _3    Weight orange bands orangebands orange bands orange bands orange bands   Reps 10-15 10-15 10-15 10-15 10-15   Time 10 Minutes 10 Minutes 10 Minutes 10 Minutes 10 Minutes     Interval Training   Interval Training _4      NuStep   Level _5 Minutes 34 34 34 34 34   METs 1.5 1.6 1.8 1.6 1.7     Track   Laps  - 5  - 4  -   Minutes  - [place] Name 03/02/17 1600 03/04/17 1600 03/09/17 1500 03/11/17 1600 03/16/17 1500     Response to Exercise   Blood Pressure (Admit) 114/80 110/70 102/60 110/60 110/64   Blood Pressure (Exercise) 120/62 154/84 146/70 156/80 110/60   Blood Pressure (Exit) 96/60 100/60 106/70 100/78 112/64   Heart Rate (Admit) 109 bpm 102 bpm 94 bpm 105 bpm 88 bpm   Heart Rate (Exercise) 122 bpm 133 bpm 121 bpm  120 bpm 134 bpm   Heart Rate (Exit) 112 bpm 107 bpm 104 bpm 99 bpm 105 bpm   Oxygen Saturation (Admit) 96 % 96 % 96 % 96 % 96 %   Oxygen Saturation (Exercise) 95 % 92 % 92 % 92 % 94 %   Oxygen Saturation (Exit) 94 % 96 %  96 % 95 % 96 %   Rating of Perceived Exertion (Exercise) _0 Perceived Dyspnea (Exercise) _1 Duration Progress to 45 minutes of aerobic exercise without signs/symptoms of physical distress Progress to 45 minutes of aerobic exercise without signs/symptoms of physical distress Progress to 45 minutes of aerobic exercise without signs/symptoms of physical distress Progress to 45 minutes of aerobic exercise without signs/symptoms of physical distress Progress to 45 minutes of aerobic exercise without signs/symptoms of physical distress   Intensity _2      Progression   Progression Continue to progress workloads to maintain intensity without signs/symptoms of physical distress. Continue to progress workloads to maintain intensity without signs/symptoms of physical distress. Continue to progress workloads to maintain intensity without signs/symptoms of physical distress. Continue to progress workloads to maintain intensity without signs/symptoms of physical distress. Continue to progress workloads to maintain intensity without signs/symptoms of physical distress.     Resistance Training   Training Prescription _3    Weight _4    Reps 10-15 10-15 10-15 10-15 10-15   Time 10 Minutes 10 Minutes 10 Minutes 10 Minutes 10 Minutes     Interval Training   Interval Training _5      NuStep   Level _6 Minutes 34 17 34 17 34   METs 1.8 1.6 1.7 1.7 1.7     Track   Laps 6._7 Minutes _8 Row Name 03/18/17 1620 03/23/17 1500 03/25/17 1500 03/30/17 1500       Response to Exercise   Blood Pressure (Admit) 112/60 105/73 119/77 129/71    Blood Pressure (Exercise)  - 120/80 116/76 122/70    Blood Pressure (Exit) 110/60 98/60 110/70 100/70    Heart Rate (Admit) 93 bpm 100 bpm 98 bpm 92 bpm    Heart  Rate (Exercise) 93 bpm 136 bpm 110 bpm 118 bpm    Heart Rate (Exit) 95 bpm 105 bpm 97 bpm 95 bpm    Oxygen Saturation (Admit) 96 % 96 % 94 % 95 %    Oxygen Saturation (Exercise) 96 % 91 % 93 % 92 %    Oxygen Saturation (Exit) 94 % 96 % 96 % 95 %    Rating of Perceived Exertion (Exercise) _9 Perceived Dyspnea (Exercise) _10 Duration Progress to 45 minutes of aerobic exercise without signs/symptoms of physical distress Progress to 45 minutes of aerobic exercise without signs/symptoms of physical distress Progress to 45 minutes of aerobic exercise without signs/symptoms of physical distress Progress to 45 minutes of aerobic exercise without signs/symptoms of physical distress    Intensity THRR unchanged THRR unchanged THRR unchanged THRR unchanged      Progression   Progression Continue to progress workloads to maintain intensity without signs/symptoms of physical distress. Continue to progress workloads to maintain intensity without signs/symptoms of physical distress.  Continue to progress workloads to maintain intensity without signs/symptoms of physical distress. Continue to progress workloads to maintain intensity without signs/symptoms of physical distress.      Resistance Training   Training Prescription Yes Yes Yes Yes    Weight orange bands orange bands orange bands orange bands    Reps 10-15 10-15 10-15 10-15    Time 10 Minutes 10 Minutes 10 Minutes 10 Minutes      Interval Training   Interval Training No No No No      NuStep   Level _0 Minutes 17 34 34 34    METs  - 2.1 2 2.1      Track   Laps  - 8  - 3    Minutes  - 17  - 17       Exercise Comments:   Exercise Goals and Review:     Exercise Goals    Row Name 01/25/17 1431             Exercise Goals   Increase Physical Activity Yes       Intervention Provide advice, education, support and counseling about physical activity/exercise needs.;Develop an individualized exercise prescription  for aerobic and resistive training based on initial evaluation findings, risk stratification, comorbidities and participant's personal goals.       Expected Outcomes Achievement of increased cardiorespiratory fitness and enhanced flexibility, muscular endurance and strength shown through measurements of functional capacity and personal statement of participant.       Increase Strength and Stamina Yes       Intervention Provide advice, education, support and counseling about physical activity/exercise needs.;Develop an individualized exercise prescription for aerobic and resistive training based on initial evaluation findings, risk stratification, comorbidities and participant's personal goals.       Expected Outcomes Achievement of increased cardiorespiratory fitness and enhanced flexibility, muscular endurance and strength shown through measurements of functional capacity and personal statement of participant.          Exercise Goals Re-Evaluation :     Exercise Goals Re-Evaluation    Row Name 02/16/17 2025 03/15/17 1421 04/12/17 1135         Exercise Goal Re-Evaluation   Exercise Goals Review Increase Physical Activity;Increase Strenth and Stamina Increase Physical Activity;Increase Strenth and Stamina Increase Physical Activity;Increase Strenth and Stamina     Comments Patient has only attended one exercise session. Will cont. to monitor and progress as able.  Patient is progressing slowly. Is limited by his extreme shortness of breath. 1.5-1.8 METS on stepper. Pursed lip breathing highly encouraged. Will cont to monitor and progress as able. Patient has been out of rehab since 03/30/17. MET levels have increase to 2.0-2.5. Will cont to monitor and progress as able. Home exercise prescription has not yet been administered due to not being physically ready to exercise at home.      Expected Outcomes Patient will cont. to increase endurance and stamina through the exercises here at rehab. He will  also practice his breathing techinques to become less short of breath on exertion.  Through exercising at rehab and at home the patient will increase strength and stamina which will make ADL's easier to preform. Through exercising at rehab and at home, patient will increase physical strength and stamina and find ADL's easier to perform.         Discharge Exercise Prescription (Final Exercise Prescription Changes):     Exercise Prescription Changes - 03/30/17 1500  Response to Exercise   Blood Pressure (Admit) 129/71   Blood Pressure (Exercise) 122/70   Blood Pressure (Exit) 100/70   Heart Rate (Admit) 92 bpm   Heart Rate (Exercise) 118 bpm   Heart Rate (Exit) 95 bpm   Oxygen Saturation (Admit) 95 %   Oxygen Saturation (Exercise) 92 %   Oxygen Saturation (Exit) 95 %   Rating of Perceived Exertion (Exercise) 15   Perceived Dyspnea (Exercise) 3   Duration Progress to 45 minutes of aerobic exercise without signs/symptoms of physical distress   Intensity THRR unchanged     Progression   Progression Continue to progress workloads to maintain intensity without signs/symptoms of physical distress.     Resistance Training   Training Prescription Yes   Weight orange bands   Reps 10-15   Time 10 Minutes     Interval Training   Interval Training No     NuStep   Level 4   Minutes 34   METs 2.1     Track   Laps 3   Minutes 17      Nutrition:  Target Goals: Understanding of nutrition guidelines, daily intake of sodium <1566m, cholesterol <2026m calories 30% from fat and 7% or less from saturated fats, daily to have 5 or more servings of fruits and vegetables.  Biometrics:     Pre Biometrics - 01/25/17 1430      Pre Biometrics   Height 6' 4.25" (1.937 m)   Weight 217 lb 2.5 oz (98.5 kg)   BMI (Calculated) 26.3   Grip Strength 30 kg       Nutrition Therapy Plan and Nutrition Goals:     Nutrition Therapy & Goals - 03/18/17 1533      Nutrition Therapy   Diet  Carb Modified, Therapeutic Lifestyle Changes     Personal Nutrition Goals   Nutrition Goal Wt loss of 1-2 lb/week to a wt loss goal of 6-20 lb at graduation from Pulmonary Rehab.    Personal Goal #2 Pt to identify and limit food sources of sodium    Personal Goal #3 The pt will consider grocery shopping on-line for food when necessary so that nourishing foods are always available at home     Intervention Plan   Intervention Prescribe, educate and counsel regarding individualized specific dietary modifications aiming towards targeted core components such as weight, hypertension, lipid management, diabetes, heart failure and other comorbidities.   Expected Outcomes Short Term Goal: Understand basic principles of dietary content, such as calories, fat, sodium, cholesterol and nutrients.;Long Term Goal: Adherence to prescribed nutrition plan.      Nutrition Discharge: Rate Your Plate Scores:     Nutrition Assessments - 03/18/17 1533      Rate Your Plate Scores   Pre Score 44      Nutrition Goals Re-Evaluation:   Nutrition Goals Discharge (Final Nutrition Goals Re-Evaluation):   Psychosocial: Target Goals: Acknowledge presence or absence of significant depression and/or stress, maximize coping skills, provide positive support system. Participant is able to verbalize types and ability to use techniques and skills needed for reducing stress and depression.  Initial Review & Psychosocial Screening:     Initial Psych Review & Screening - 01/25/17 1511      Initial Review   Current issues with Current Stress Concerns   Source of Stress Concerns Chronic Illness     Family Dynamics   Good Support System? Yes     Barriers   Psychosocial barriers to participate in program There  are no identifiable barriers or psychosocial needs.     Screening Interventions   Interventions Encouraged to exercise      Quality of Life Scores:   PHQ-9: Recent Review Flowsheet Data    Depression  screen [facility] 2/9 01/25/2017 04/30/2016   Decreased Interest 2 0   Down, Depressed, Hopeless 3 0   PHQ - 2 Score 5 0   Altered sleeping 1 -   Tired, decreased energy 3 -   Change in appetite 0 -   Feeling bad or failure about yourself  3 -   Trouble concentrating 0 -   Moving slowly or fidgety/restless 0 -   Suicidal thoughts 0 -   PHQ-9 Score 12 -   Difficult doing work/chores Somewhat difficult -     Interpretation of Total Score  Total Score Depression Severity:  1-4 = Minimal depression, 5-9 = Mild depression, 10-14 = Moderate depression, 15-19 = Moderately severe depression, 20-27 = Severe depression   Psychosocial Evaluation and Intervention:     Psychosocial Evaluation - 01/25/17 1515      Psychosocial Evaluation & Interventions   Continue Psychosocial Services  Follow up required by counselor      Psychosocial Re-Evaluation:     Psychosocial Re-Evaluation    [place] Name 02/15/17 1155 03/16/17 1612 04/15/17 0910         Psychosocial Re-Evaluation   Current issues with Current Stress Concerns Current Stress Concerns  -     Comments Feels stressed due to his chronic illness, is presently seeing a counselor to help deal with these feelings. hoping PR will improve his health his health is a concern for him, has been absent for 2 weeks due to a COPD exacerbation     Expected Outcomes Hopefully exercise will improve his quality of life by improving his strength and stamina  continue counselling and exercise continue counselling and exercise when returns to program     Interventions Encouraged to attend Pulmonary Rehabilitation for the exercise  is also seeing a counselor Encouraged to attend Pulmonary Rehabilitation for the exercise  -     Continue Psychosocial Services  Follow up required by counselor Follow up required by counselor Follow up required by staff       Initial Review   Source of Stress Concerns Chronic Illness Chronic Illness  -        Psychosocial Discharge  (Final Psychosocial Re-Evaluation):     Psychosocial Re-Evaluation - 04/15/17 0910      Psychosocial Re-Evaluation   Comments his health is a concern for him, has been absent for 2 weeks due to a COPD exacerbation   Expected Outcomes continue counselling and exercise when returns to program   Continue Psychosocial Services  Follow up required by staff      Education: Education Goals: Education classes will be provided on a weekly basis, covering required topics. Participant will state understanding/return demonstration of topics presented.  Learning Barriers/Preferences:     Learning Barriers/Preferences - 01/25/17 1510      Learning Barriers/Preferences   Learning Barriers None   Learning Preferences Computer/Internet;Written Material;Verbal Instruction;Individual Instruction;Group Instruction      Education Topics: Risk Factor Reduction:  -Group instruction that is supported by a PowerPoint presentation. Instructor discusses the definition of a risk factor, different risk factors for pulmonary disease, and how the heart and lungs work together.     Nutrition for Pulmonary Patient:  -Group instruction provided by PowerPoint slides, verbal discussion, and written materials to support subject matter.  The instructor gives an explanation and review of healthy diet recommendations, which includes a discussion on weight management, recommendations for fruit and vegetable consumption, as well as protein, fluid, caffeine, fiber, sodium, sugar, and alcohol. Tips for eating when patients are short of breath are discussed.   Pursed Lip Breathing:  -Group instruction that is supported by demonstration and informational handouts. Instructor discusses the benefits of pursed lip and diaphragmatic breathing and detailed demonstration on how to preform both.     Oxygen Safety:  -Group instruction provided by PowerPoint, verbal discussion, and written material to support subject matter. There  is an overview of "What is Oxygen" and "Why do we need it".  Instructor also reviews how to create a safe environment for oxygen use, the importance of using oxygen as prescribed, and the risks of noncompliance. There is a brief discussion on traveling with oxygen and resources the patient may utilize.   Oxygen Equipment:  -Group instruction provided by [facility] Staff utilizing handouts, written materials, and equipment demonstrations.   Signs and Symptoms:  -Group instruction provided by written material and verbal discussion to support subject matter. Warning signs and symptoms of infection, stroke, and heart attack are reviewed and when to call the physician/911 reinforced. Tips for preventing the spread of infection discussed.   PULMONARY REHAB CHRONIC OBSTRUCTIVE PULMONARY DISEASE from 03/25/2017 in [place]  Date  03/11/17  Educator  RN  Instruction Review Code  2- meets goals/outcomes      Advanced Directives:  -Group instruction provided by verbal instruction and written material to support subject matter. Instructor reviews Advanced Directive laws and proper instruction for filling out document.   Pulmonary Video:  -Group video education that reviews the importance of medication and oxygen compliance, exercise, good nutrition, pulmonary hygiene, and pursed lip and diaphragmatic breathing for the pulmonary patient.   PULMONARY REHAB CHRONIC OBSTRUCTIVE PULMONARY DISEASE from 03/25/2017 in [place]  Date  02/18/17  Instruction Review Code  2- meets goals/outcomes      Exercise for the Pulmonary Patient:  -Group instruction that is supported by a PowerPoint presentation. Instructor discusses benefits of exercise, core components of exercise, frequency, duration, and intensity of an exercise routine, importance of utilizing pulse oximetry during exercise, safety while exercising, and options of places to exercise  outside of rehab.     PULMONARY REHAB CHRONIC OBSTRUCTIVE PULMONARY DISEASE from 03/25/2017 in [place]  Date  02/25/17  Educator  ep  Instruction Review Code  2- meets goals/outcomes      Pulmonary Medications:  -Verbally interactive group education provided by instructor with focus on inhaled medications and proper administration.   PULMONARY REHAB CHRONIC OBSTRUCTIVE PULMONARY DISEASE from 03/25/2017 in [place]  Date  03/25/17  Educator  Pharm  Instruction Review Code  2- meets goals/outcomes      Anatomy and Physiology of the Respiratory System and Intimacy:  -Group instruction provided by PowerPoint, verbal discussion, and written material to support subject matter. Instructor reviews respiratory cycle and anatomical components of the respiratory system and their functions. Instructor also reviews differences in obstructive and restrictive respiratory diseases with examples of each. Intimacy, Sex, and Sexuality differences are reviewed with a discussion on how relationships can change when diagnosed with pulmonary disease. Common sexual concerns are reviewed.   PULMONARY REHAB CHRONIC OBSTRUCTIVE PULMONARY DISEASE from 03/25/2017 in [place]  Date  03/18/17  Educator  RN  Instruction Review Code  2- meets goals/outcomes      MD DAY -A group question and answer session with a medical doctor that allows participants to ask questions that relate to their pulmonary disease state.   OTHER EDUCATION -Group or individual verbal, written, or video instructions that support the educational goals of the pulmonary rehab program.   Knowledge Questionnaire Score:     Knowledge Questionnaire Score - 04/14/17 1510      Knowledge Questionnaire Score   Pre Score 10/13      Core Components/Risk Factors/Patient Goals at Admission:     Personal Goals and Risk Factors at Admission -  01/25/17 1515      Core Components/Risk Factors/Patient Goals on Admission    Weight Management Yes   Intervention Weight Management: Develop a combined nutrition and exercise program designed to reach desired caloric intake, while maintaining appropriate intake of nutrient and fiber, sodium and fats, and appropriate energy expenditure required for the weight goal.   Expected Outcomes Short Term: Continue to assess and modify interventions until short term weight is achieved;Weight Loss: Understanding of general recommendations for a balanced deficit meal plan, which promotes 1-2 lb weight loss per week and includes a negative energy balance of [phone] kcal/d;Understanding recommendations for meals to include 15-35% energy as protein, 25-35% energy from fat, 35-60% energy from carbohydrates, less than 210m of dietary cholesterol, 20-35 gm of total fiber daily;Understanding of distribution of calorie intake throughout the day with the consumption of 4-5 meals/snacks   Improve shortness of breath with ADL's Yes   Intervention Provide education, individualized exercise plan and daily activity instruction to help decrease symptoms of SOB with activities of daily living.   Expected Outcomes Short Term: Achieves a reduction of symptoms when performing activities of daily living.   Develop more efficient breathing techniques such as purse lipped breathing and diaphragmatic breathing; and practicing self-pacing with activity Yes   Intervention Provide education, demonstration and support about specific breathing techniuqes utilized for more efficient breathing. Include techniques such as pursed lipped breathing, diaphragmatic breathing and self-pacing activity.   Expected Outcomes Short Term: Participant will be able to demonstrate and use breathing techniques as needed throughout daily activities.   Intervention Refer participants experiencing significant psychosocial distress to appropriate mental health  specialists for further evaluation and treatment. When possible, include family members and significant others in education/counseling sessions.;Offer individual and/or small group education and counseling on adjustment to heart disease, stress management and health-related lifestyle change. Teach and support self-help strategies.   Expected Outcomes Long Term: Emotional wellbeing is indicated by absence of clinically significant psychosocial distress or social isolation.;Short Term: Participant demonstrates changes in health-related behavior, relaxation and other stress management skills, ability to obtain effective social support, and compliance with psychotropic medications if prescribed.      Core Components/Risk Factors/Patient Goals Review:      Goals and Risk Factor Review    Row Name 02/15/17 1151 03/16/17 1608 04/15/17 0907         Core Components/Risk Factors/Patient Goals Review   Personal Goals Review Weight Management/Obesity;Improve shortness of breath with ADL's;Develop more efficient breathing techniques such as purse lipped breathing and diaphragmatic breathing and practicing self-pacing with activity.;Stress Develop more efficient breathing techniques such as purse lipped breathing and diaphragmatic breathing and practicing self-pacing with activity. Develop more efficient breathing techniques such as purse lipped breathing and diaphragmatic breathing and practicing self-pacing with activity.;Improve shortness of breath with ADL's;Increase knowledge of respiratory medications and  ability to use respiratory devices properly.     Review Just started program, has only attended 1 exercise session, too early to have met any goals Has stage 4 COPD, very limited re: his ability to exercise and balance his SOB.  PLB reinforced with each session, little to no progression.  Great attendance! Presently is being treated for a COPD exacerbation and has been absent since 03/30/17.  Little to no  progression before this.     Expected Outcomes Expect to be more independent with exercise and feeling more comfortable in the exercise setting Continue with plan and hope for continued very slow progress.   Continue to work with patient with strengthening and PLB.        Core Components/Risk Factors/Patient Goals at Discharge (Final Review):      Goals and Risk Factor Review - 04/15/17 0907      Core Components/Risk Factors/Patient Goals Review   Personal Goals Review Develop more efficient breathing techniques such as purse lipped breathing and diaphragmatic breathing and practicing self-pacing with activity.;Improve shortness of breath with ADL's;Increase knowledge of respiratory medications and ability to use respiratory devices properly.   Review Presently is being treated for a COPD exacerbation and has been absent since 03/30/17.  Little to no progression before this.   Expected Outcomes Continue to work with patient with strengthening and PLB.      ITP Comments:   Comments: ITP REVIEW Pt is making expected progress toward pulmonary rehab goals after completing 14 sessions. Recommend continued exercise, life style modification, education, and utilization of breathing techniques to increase stamina and strength and decrease shortness of breath with exertion.

## 2017-04-19 ENCOUNTER — Telehealth (HOSPITAL_COMMUNITY): Payer: Self-pay | Admitting: *Deleted

## 2017-04-20 ENCOUNTER — Encounter (HOSPITAL_COMMUNITY): Payer: Medicare Other

## 2017-04-22 ENCOUNTER — Encounter (HOSPITAL_COMMUNITY): Payer: Medicare Other

## 2017-04-22 DIAGNOSIS — J9610 Chronic respiratory failure, unspecified whether with hypoxia or hypercapnia: Secondary | ICD-10-CM | POA: Diagnosis not present

## 2017-04-27 ENCOUNTER — Encounter (HOSPITAL_COMMUNITY): Payer: Medicare Other

## 2017-04-27 ENCOUNTER — Telehealth: Payer: Self-pay

## 2017-04-27 NOTE — Telephone Encounter (Signed)
Spoke with pt, aware of ONO results/recs.  Nothing further needed.

## 2017-04-27 NOTE — Telephone Encounter (Signed)
-----   Message from Lupita Leashouglas B McQuaid, MD sent at 04/23/2017  6:01 PM EDT ----- A, Please let him know that his ONO was OK. Thanks,  B

## 2017-04-29 ENCOUNTER — Encounter: Payer: Self-pay | Admitting: Pulmonary Disease

## 2017-04-29 ENCOUNTER — Encounter (HOSPITAL_COMMUNITY): Payer: Medicare Other

## 2017-04-29 ENCOUNTER — Ambulatory Visit (INDEPENDENT_AMBULATORY_CARE_PROVIDER_SITE_OTHER): Payer: Medicare Other | Admitting: Pulmonary Disease

## 2017-04-29 VITALS — BP 114/80 | HR 88 | Ht 76.0 in | Wt 213.4 lb

## 2017-04-29 DIAGNOSIS — J9610 Chronic respiratory failure, unspecified whether with hypoxia or hypercapnia: Secondary | ICD-10-CM

## 2017-04-29 MED ORDER — ALBUTEROL SULFATE HFA 108 (90 BASE) MCG/ACT IN AERS: 2.0000 | INHALATION_SPRAY | Freq: Four times a day (QID) | RESPIRATORY_TRACT | 3 refills | Status: DC | PRN

## 2017-04-29 NOTE — Patient Instructions (Addendum)
For your severe COPD: Continue taking prednisone 10 mg daily We will change Ventolin to ProAir Keep taking Spiriva and Symbicort If cardiology says that she do not have evidence of A. fib then we will start daily azithromycin on the next visit  For your nocturnal hypoxemia: We will repeat an overnight oxygen test while you are breathing room air  For your recurrent episodes of shortness of breath with a history of possible A. fib: Follow-up with cardiology  We will see you back in 3 months or sooner if needed

## 2017-04-29 NOTE — Progress Notes (Signed)
Subjective:    Patient ID: Theodore [name], male    DOB: 07-Nov-1956, 61 y.o.   MRN: [id number]  Synopsis: Has stage IV COPD (steroid dependent) and history of a pulmonary embolism after a hospitalization in 2015 formerly followed by Dr. Sherene SiresWert. Dr. Sherene SiresWert summarized his situation as follows: - PFT's 12/02/2011  FEV1  0.94 (24%) ratio 39 -  18% response to B 2 and DLCO 60%  -Alpha 1 genotype sent 03/01/12 >  MM  -07/14/2013 Med calendar , 10/24/2013  - self titration of steroids with ceiling of pred 20/ floor of zero rec 11/07/13 > did not follow action plan 12/11/2013 > repeated same 01/19/14  - 08/06/2014 p extensive coaching HFA effectiveness =    90%  - Smoked 3 packs a day at the most, decreased to 1 ppd for a "few years", he quit altogether since 2015 -second lifetime pulmomary embolism in January 2018  HPI Chief Complaint  Patient presents with  . Follow-up    Pt states that he was doing pulmonary rehab but staff heard rattles in chest and wanted him to move his appt up. Pt came in 03/29/17 and is wondering if this appt today was not taken off the list from his visit on 03/29/17. Pt needs to get a Rx switched from Ventolin to ProAir. Pt is also low on nebulizer. Chronic SOB and prod. cough with thick white to clear mucus. DME: concentrator after using [facility] 2L oxygen at bedtime.    Theodore [name] Has been doing okay since the last visit. He has good days and bad days. Some days he will have more trouble breathing than others and he says that more and more lately this does not correlate with his episodes of chest tightness or mucus production. On the last visit we had a tree with a prednisone taper for COPD exacerbation and he eventually saw improvement several days into the prednisone taper. Lately he says that his mucus production is at baseline. He does try to walk every day. He continues to participate in pulmonary rehabilitation.  On multiple occasions at pulmonary rehabilitation he's been found to have a  heart rhythm suggestive of atrial fibrillation. He's never seen cardiology for this.  Past Medical History:  Diagnosis Date  . Anxiety   . Arthritis    "back" (01/24/2014)  . Asthma   . Chronic bronchitis (HCC)   . Cluster headache    "last bout was summer 2014; had them q spring 1991-2001" (01/24/2014)  . COPD (chronic obstructive pulmonary disease) (HCC)   . GERD (gastroesophageal reflux disease)   . NSTEMI (non-ST elevated myocardial infarction) (HCC) 01/2014  . On home oxygen therapy    "2L; 24/7" (01/24/2014)  . Pneumonia 06/2013; 01/2014  . Pulmonary embolism (HCC) 01/23/2014  . Seasonal allergies       Review of Systems  Constitutional: Positive for fatigue. Negative for chills and fever.  HENT: Negative for postnasal drip, rhinorrhea and sinus pressure.   Respiratory: Positive for chest tightness, shortness of breath and wheezing.   Cardiovascular: Positive for leg swelling. Negative for chest pain and palpitations.       Objective:   Physical Exam  Vitals:   04/29/17 1551 04/29/17 1553  BP:  114/80  Pulse: 88   SpO2: 97%   Weight: 213 lb 6.4 oz (96.8 kg)   Height: 6\' 4"  (1.93 m)    RA  O2 saturation measured while ambulating on room air, walked 125 feet O2 saturation went to 92%, no  lower  Gen: chronically ill appearing HENT: OP clear, TM's clear, neck supple PULM: Few wheezes B, normal percussion CV: RRR, no mgr, trace edema GI: BS+, soft, nontender Derm: thin skin Psyche: normal mood and affect    Records from pulmonary rehabilitation reviewed were he was evaluated for wheezing and cough and referred to our clinic  January 2018 echocardiogram reviewed showing a normal LVEF but mild pulmonary hypertension     Assessment & Plan:   Impression: Severe COPD, steroid-dependent Nocturnal hypoxemia Palpitations 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 We need to get an overnight oxygen test on room air to see if he still needs supplemental oxygen in the evenings.  Because of recurrent episodes of a fast heart rate seen by EMS when he was dyspneic and while on telemetry at pulmonary rehabilitation, I would like for him to be seen by cardiology to be evaluated for possible A. fib. This would help me decide whether or not it safe for him to use daily azithromycin to prevent exacerbations of COPD.  Plan: For your severe COPD: Continue taking prednisone 10 mg daily We will change Ventolin to ProAir Keep taking Spiriva and Symbicort If cardiology says that she do not have evidence of A. fib then we will start daily azithromycin on the next visit  For your nocturnal hypoxemia: We will repeat an overnight oxygen test while you are breathing room air  For your recurrent episodes of shortness of breath with a history of possible A. fib: Follow-up with cardiology  For your recurrent pulmonary emboli: Continue Xarelto for life  We will see you back in 3 months or sooner if needed     Current Outpatient Prescriptions:  .  albuterol (PROVENTIL) (2.5 MG/3ML) 0.083% nebulizer solution, Take 2.5 mg by nebulization every 6 (six) hours as needed for wheezing or shortness of breath., Disp: , Rfl:  .  fluticasone (FLONASE) 50 MCG/ACT nasal spray, Place 2 sprays into both nostrils daily., Disp: , Rfl:  .  guaiFENesin (MUCINEX) 600 MG 12 hr tablet, Take 1,200 mg by mouth 2 (two) times daily. , Disp: , Rfl:  .  Multiple Vitamin (MULTIVITAMIN WITH MINERALS) TABS tablet, Take 1 tablet by mouth 3 (three) times a week., Disp: , Rfl:  .  nicotine polacrilex (NICORETTE) 4 MG gum, Take 4 mg by mouth daily as needed for smoking cessation., Disp: , Rfl:  .  OXYGEN-HELIUM IN, Inhale 2 L into the lungs at bedtime. , Disp: , Rfl:  .  predniSONE (DELTASONE) 10 MG tablet, Take 10 mg by mouth daily with breakfast., Disp: , Rfl:  .  rivaroxaban  (XARELTO) 20 MG TABS tablet, Take 1 tablet (20 mg total) by mouth daily with supper., Disp: 30 tablet, Rfl: 6 .  sodium chloride (OCEAN) 0.65 % SOLN nasal spray, Place 1 spray into both nostrils 4 (four) times daily as needed for congestion., Disp: , Rfl:  .  SPIRIVA HANDIHALER 18 MCG inhalation capsule, INHALE THE CONTENTS OF 1 CAPSULE VIA INHALATION DEVICE EVERY DAY, Disp: 30 capsule, Rfl: 5 .  SYMBICORT 160-4.5 MCG/ACT inhaler, TAKE 2 PUFFS FIRST THING IN THE MORNING AND THEN ANOTHER 2 PUFFS ABOUT 12 HOURS LATER, Disp: 10.2 g, Rfl: 5 .  triamterene-hydrochlorothiazide (MAXZIDE-25) 37.5-25 MG tablet, TAKE 1 TABLET BY MOUTH DAILY, Disp: 30 tablet, Rfl: 5 .  albuterol (PROAIR HFA) 108 (90 Base) MCG/ACT inhaler, Inhale 2 puffs into the lungs  every 6 (six) hours as needed for wheezing or shortness of breath., Disp: 1 Inhaler, Rfl: 3

## 2017-05-04 ENCOUNTER — Encounter (HOSPITAL_COMMUNITY): Payer: Medicare Other

## 2017-05-05 ENCOUNTER — Ambulatory Visit: Payer: Medicare Other | Admitting: Internal Medicine

## 2017-05-06 ENCOUNTER — Encounter (HOSPITAL_COMMUNITY): Payer: Medicare Other

## 2017-05-11 ENCOUNTER — Encounter (HOSPITAL_COMMUNITY): Payer: Medicare Other

## 2017-05-13 ENCOUNTER — Encounter (HOSPITAL_COMMUNITY): Payer: Medicare Other

## 2017-05-18 ENCOUNTER — Telehealth: Payer: Self-pay | Admitting: Pulmonary Disease

## 2017-05-18 ENCOUNTER — Encounter (HOSPITAL_COMMUNITY): Payer: Medicare Other

## 2017-05-18 MED ORDER — PREDNISONE 10 MG PO TABS: 10.0000 mg | ORAL_TABLET | Freq: Every day | ORAL | 2 refills | Status: DC

## 2017-05-18 NOTE — Telephone Encounter (Signed)
Spoke with patient. He is requesting a refill on his prednisone 10mg  to be sent into [facility] on [address]. RX has been sent into pharmacy. Patient verbalized understanding. Nothing else needed at time of visit.

## 2017-05-18 NOTE — Addendum Note (Signed)
Encounter addended by: Drema PryBehrens, Folashade Gamboa A, RN on: 05/18/2017  4:24 PM<BR>    Actions taken: Sign clinical note, Episode resolved

## 2017-05-18 NOTE — Progress Notes (Signed)
Pulmonary Individual Treatment Plan  Patient Details  Name: Theodore [name] MRN: [id number] Date of Birth: 10/19/1956 Referring Provider:     Pulmonary Rehab Walk Test from 01/28/2017 in [place]  Referring Provider  Dr. Lake Bells      Initial Encounter Date:    Pulmonary Rehab Walk Test from 01/28/2017 in [place]  Date  01/28/17  Referring Provider  Dr. 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  albuterol (PROAIR HFA) 108 (90 Base) MCG/ACT inhaler, Inhale 2 puffs into the lungs every 6 (six) hours as needed for wheezing or shortness of breath., Disp: 1 Inhaler, Rfl: 3 .  albuterol (PROVENTIL) (2.5 MG/3ML) 0.083% nebulizer solution, Take 2.5 mg by nebulization every 6 (six) hours as needed for wheezing or shortness of breath., Disp: , Rfl:  .  fluticasone (FLONASE) 50 MCG/ACT nasal spray, Place 2 sprays into both nostrils daily., Disp: , Rfl:  .  guaiFENesin (MUCINEX) 600 MG 12 hr tablet, Take 1,200 mg by mouth 2 (two) times daily. , Disp: , Rfl:  .  Multiple Vitamin (MULTIVITAMIN WITH MINERALS) TABS tablet, Take 1 tablet by mouth 3 (three) times a week., Disp: , Rfl:  .  nicotine polacrilex (NICORETTE) 4 MG gum, Take 4 mg by mouth daily as needed for smoking cessation., Disp: , Rfl:  .  OXYGEN-HELIUM IN, Inhale 2 L into the lungs at bedtime. , Disp: , Rfl:  .  predniSONE (DELTASONE) 10 MG tablet, Take 1 tablet (10 mg total) by mouth daily with breakfast., Disp: 30 tablet, Rfl: 2 .  rivaroxaban (XARELTO) 20 MG TABS tablet, Take 1 tablet (20 mg total) by mouth daily with supper., Disp: 30 tablet, Rfl: 6 .  sodium chloride (OCEAN) 0.65 % SOLN nasal spray, Place 1 spray into both nostrils 4 (four) times daily as needed for congestion., Disp: , Rfl:  .  SPIRIVA HANDIHALER 18 MCG inhalation capsule,  INHALE THE CONTENTS OF 1 CAPSULE VIA INHALATION DEVICE EVERY DAY, Disp: 30 capsule, Rfl: 5 .  SYMBICORT 160-4.5 MCG/ACT inhaler, TAKE 2 PUFFS FIRST THING IN THE MORNING AND THEN ANOTHER 2 PUFFS ABOUT 12 HOURS LATER, Disp: 10.2 g, Rfl: 5 .  triamterene-hydrochlorothiazide (MAXZIDE-25) 37.5-25 MG tablet, TAKE 1 TABLET BY MOUTH DAILY, Disp: 30 tablet, Rfl: 5  Past Medical History: Past Medical History:  Diagnosis Date  . Anxiety   . Arthritis    "back" (01/24/2014)  . Asthma   . Chronic bronchitis ([place])   . Cluster headache    "last bout was summer 2014; had them q spring 1991-2001" (01/24/2014)  . COPD (chronic obstructive pulmonary disease) ([place])   . GERD (gastroesophageal reflux disease)   . NSTEMI (non-ST elevated myocardial infarction) ([place]) 01/2014  . On home oxygen therapy    "2L; 24/7" (01/24/2014)  . Pneumonia 06/2013; 01/2014  . Pulmonary embolism ([place]) 01/23/2014  . Seasonal allergies     Tobacco Use: History  Smoking Status  . Former Smoker  . Packs/day: 2.00  . Years: 35.00  . Types: Cigarettes  . Quit date: 01/24/2012  Smokeless Tobacco  . Never Used    Comment: Remain smoke free    Labs: Recent Review Flowsheet Data    Labs for ITP Cardiac and Pulmonary Rehab Latest Ref Rng & Units 01/10/2014 01/11/2014 09/03/2014 03/25/2015 04/30/2016   Cholestrol 0 - 200  mg/dL - 178 231(H) - 217(H)   LDLCALC 0 - 99 mg/dL - 82 145(H) - 135(H)   HDL >39.00 mg/dL - 81 65.50 - 61.70   Trlycerides 0.0 - 149.0 mg/dL - 75 101.0 - 102.0   Hemoglobin A1c 4.6 - 6.5 % 5.9(H) - - 5.9 5.8   PHART 7.350 - 7.450 7.382 - - - -   PCO2ART 35.0 - 45.0 mmHg 34.7(L) - - - -   HCO3 20.0 - 24.0 mEq/L 20.1 - - - -   TCO2 0 - 100 mmol/L 17.6 - - - -   ACIDBASEDEF 0.0 - 2.0 mmol/L 3.7(H) - - - -   O2SAT % 95.6 - - - -      Capillary Blood Glucose: Lab Results  Component Value Date   GLUCAP 109 (H) 01/13/2014   GLUCAP 96 01/13/2014   GLUCAP 138 (H) 01/12/2014   GLUCAP 169 (H) 01/12/2014   GLUCAP 135  (H) 01/12/2014     ADL UCSD:     Pulmonary Assessment Scores    Row Name 04/14/17 1511         ADL UCSD   ADL Phase Entry     SOB Score total 92       CAT Score   CAT Score 33  Entry        Pulmonary Function Assessment:     Pulmonary Function Assessment - 01/25/17 1511      Breath   Bilateral Breath Sounds Wheezes;Expiratory   Shortness of Breath Limiting activity;Yes      Exercise Target Goals:    Exercise Program Goal: Individual exercise prescription set with THRR, safety & activity barriers. Participant demonstrates ability to understand and report RPE using BORG scale, to self-measure pulse accurately, and to acknowledge the importance of the exercise prescription.  Exercise Prescription Goal: Starting with aerobic activity 30 plus minutes a day, 3 days per week for initial exercise prescription. Provide home exercise prescription and guidelines that participant acknowledges understanding prior to discharge.  Activity Barriers & Risk Stratification:     Activity Barriers & Cardiac Risk Stratification - 01/25/17 1429      Activity Barriers & Cardiac Risk Stratification   Activity Barriers Deconditioning;Shortness of Breath      6 Minute Walk:     6 Minute Walk    Row Name 01/28/17 1607         6 Minute Walk   Phase Initial     Distance 700 feet     Walk Time -  4 minutes 10 seconds total     # of Rest Breaks 1  1 minute 50 seconds     MPH 1.3     METS 2     RPE 18     Perceived Dyspnea  4     Symptoms Yes (comment)     Comments leg fatigue     Resting HR 122 bpm     Resting BP 106/70     Max Ex. HR 154 bpm     Max Ex. BP 154/90     2 Minute Post BP 154/80       Interval HR   Baseline HR 122     1 Minute HR 122     2 Minute HR 136     3 Minute HR 139     4 Minute HR 136     5 Minute HR 148     6 Minute HR 154     2 Minute  Post HR 137     Interval Heart Rate? Yes       Interval Oxygen   Interval Oxygen? Yes     Baseline  Oxygen Saturation % 91 %     Baseline Liters of Oxygen 0 L     1 Minute Oxygen Saturation % 91 %     1 Minute Liters of Oxygen 0 L     2 Minute Oxygen Saturation % 91 %     2 Minute Liters of Oxygen 0 L     3 Minute Oxygen Saturation % 93 %     3 Minute Liters of Oxygen 0 L     4 Minute Oxygen Saturation % 94 %     4 Minute Liters of Oxygen 0 L     5 Minute Oxygen Saturation % 92 %     5 Minute Liters of Oxygen 0 L     6 Minute Oxygen Saturation % 92 %     6 Minute Liters of Oxygen 0 L     2 Minute Post Oxygen Saturation % 95 %     2 Minute Post Liters of Oxygen 0 L        Oxygen Initial Assessment:     Oxygen Initial Assessment - 01/25/17 1428      Home Oxygen   Home Oxygen Device Home Concentrator   Sleep Oxygen Prescription Continuous   Liters per minute 2   Home Exercise Oxygen Prescription None   Home at Rest Exercise Oxygen Prescription None   Compliance with Home Oxygen Use Yes      Oxygen Re-Evaluation:     Oxygen Re-Evaluation    Row Name 02/15/17 1146 03/16/17 1607 04/15/17 0907         Program Oxygen Prescription   Program Oxygen Prescription None None None       Home Oxygen   Home Oxygen Device Home Concentrator None None     Sleep Oxygen Prescription Continuous None None     Liters per minute 2  -  -     Home Exercise Oxygen Prescription None None None     Home at Rest Exercise Oxygen Prescription None None None     Compliance with Home Oxygen Use Yes No  -        Oxygen Discharge (Final Oxygen Re-Evaluation):     Oxygen Re-Evaluation - 04/15/17 0907      Program Oxygen Prescription   Program Oxygen Prescription None     Home Oxygen   Home Oxygen Device None   Sleep Oxygen Prescription None   Home Exercise Oxygen Prescription None   Home at Rest Exercise Oxygen Prescription None      Initial Exercise Prescription:     Initial Exercise Prescription - 01/28/17 1600      Date of Initial Exercise RX and Referring Provider   Date  01/28/17   Referring Provider Dr. Kendrick Fries     NuStep   Level 1   Minutes 34   METs 1.5     Track   Laps 3   Minutes 17     Prescription Details   Frequency (times per week) 2   Duration Progress to 45 minutes of aerobic exercise without signs/symptoms of physical distress     Intensity   THRR 40-80% of Max Heartrate 63-126   Ratings of Perceived Exertion 11-13   Perceived Dyspnea 0-4     Progression   Progression Continue progressive overload as per policy without signs/symptoms  or physical distress.     Resistance Training   Training Prescription Yes   Weight orange bands   Reps 10-15      Perform Capillary Blood Glucose checks as needed.  Exercise Prescription Changes:     Exercise Prescription Changes    Row Name 02/04/17 1200 02/16/17 1500 02/18/17 1500 02/23/17 1503 02/25/17 1646     Response to Exercise   Blood Pressure (Admit) 100/70 112/70 104/60 138/80 114/60   Blood Pressure (Exercise) 124/60 114/66 140/80 131/90 126/70   Blood Pressure (Exit) 112/70 100/60 100/70 122/80 108/70   Heart Rate (Admit) 88 bpm 92 bpm 88 bpm 97 bpm 92 bpm   Heart Rate (Exercise) 121 bpm 125 bpm 99 bpm 132 bpm 118 bpm   Heart Rate (Exit) 102 bpm 105 bpm 100 bpm 95 bpm 100 bpm   Oxygen Saturation (Admit) 97 % 95 % 95 % 97 % 94 %   Oxygen Saturation (Exercise) 95 % 94 % 93 % 94 % 92 %   Oxygen Saturation (Exit) 96 % 95 % 96 % 94 % 90 %   Rating of Perceived Exertion (Exercise) _0 Perceived Dyspnea (Exercise) _1 Duration Continue with 45 min of aerobic exercise without signs/symptoms of physical distress. Progress to 45 minutes of aerobic exercise without signs/symptoms of physical distress Progress to 45 minutes of aerobic exercise without signs/symptoms of physical distress Progress to 45 minutes of aerobic exercise without signs/symptoms of physical distress Progress to 45 minutes of aerobic exercise without signs/symptoms of physical distress   Intensity  _2      Progression   Progression Continue to progress workloads to maintain intensity without signs/symptoms of physical distress. Continue to progress workloads to maintain intensity without signs/symptoms of physical distress. Continue to progress workloads to maintain intensity without signs/symptoms of physical distress. Continue to progress workloads to maintain intensity without signs/symptoms of physical distress. Continue to progress workloads to maintain intensity without signs/symptoms of physical distress.     Resistance Training   Training Prescription _3    Weight orange bands orangebands orange bands orange bands orange bands   Reps 10-15 10-15 10-15 10-15 10-15   Time 10 Minutes 10 Minutes 10 Minutes 10 Minutes 10 Minutes     Interval Training   Interval Training _4      NuStep   Level _5 Minutes 34 34 34 34 34   METs 1.5 1.6 1.8 1.6 1.7     Track   Laps  - 5  - 4  -   Minutes  - 17  - 17  -   Row Name 03/02/17 1600 03/04/17 1600 03/09/17 1500 03/11/17 1600 03/16/17 1500     Response to Exercise   Blood Pressure (Admit) 114/80 110/70 102/60 110/60 110/64   Blood Pressure (Exercise) 120/62 154/84 146/70 156/80 110/60   Blood Pressure (Exit) 96/60 100/60 106/70 100/78 112/64   Heart Rate (Admit) 109 bpm 102 bpm 94 bpm 105 bpm 88 bpm   Heart Rate (Exercise) 122 bpm 133 bpm 121 bpm 120 bpm 134 bpm   Heart Rate (Exit) 112 bpm 107 bpm 104 bpm 99 bpm 105 bpm   Oxygen Saturation (Admit) 96 % 96 % 96 % 96 % 96 %   Oxygen Saturation (Exercise) 95 % 92 % 92 % 92 % 94 %  Oxygen Saturation (Exit) 94 % 96 % 96 % 95 % 96 %   Rating of Perceived Exertion (Exercise) _0 Perceived Dyspnea (Exercise) _1 Duration Progress to 45 minutes of aerobic exercise without signs/symptoms of physical distress Progress to 45 minutes of aerobic exercise without  signs/symptoms of physical distress Progress to 45 minutes of aerobic exercise without signs/symptoms of physical distress Progress to 45 minutes of aerobic exercise without signs/symptoms of physical distress Progress to 45 minutes of aerobic exercise without signs/symptoms of physical distress   Intensity _2      Progression   Progression Continue to progress workloads to maintain intensity without signs/symptoms of physical distress. Continue to progress workloads to maintain intensity without signs/symptoms of physical distress. Continue to progress workloads to maintain intensity without signs/symptoms of physical distress. Continue to progress workloads to maintain intensity without signs/symptoms of physical distress. Continue to progress workloads to maintain intensity without signs/symptoms of physical distress.     Resistance Training   Training Prescription _3    Weight _4    Reps 10-15 10-15 10-15 10-15 10-15   Time 10 Minutes 10 Minutes 10 Minutes 10 Minutes 10 Minutes     Interval Training   Interval Training _5      NuStep   Level _6 Minutes 34 17 34 17 34   METs 1.8 1.6 1.7 1.7 1.7     Track   Laps 6._7 Minutes _8 Row Name 03/18/17 1620 03/23/17 1500 03/25/17 1500 03/30/17 1500       Response to Exercise   Blood Pressure (Admit) 112/60 105/73 119/77 129/71    Blood Pressure (Exercise)  - 120/80 116/76 122/70    Blood Pressure (Exit) 110/60 98/60 110/70 100/70    Heart Rate (Admit) 93 bpm 100 bpm 98 bpm 92 bpm    Heart Rate (Exercise) 93 bpm 136 bpm 110 bpm 118 bpm    Heart Rate (Exit) 95 bpm 105 bpm 97 bpm 95 bpm    Oxygen Saturation (Admit) 96 % 96 % 94 % 95 %    Oxygen Saturation (Exercise) 96 % 91 % 93 % 92 %    Oxygen Saturation (Exit) 94 % 96 % 96 % 95 %    Rating of  Perceived Exertion (Exercise) _9 Perceived Dyspnea (Exercise) _10 Duration Progress to 45 minutes of aerobic exercise without signs/symptoms of physical distress Progress to 45 minutes of aerobic exercise without signs/symptoms of physical distress Progress to 45 minutes of aerobic exercise without signs/symptoms of physical distress Progress to 45 minutes of aerobic exercise without signs/symptoms of physical distress    Intensity THRR unchanged THRR unchanged THRR unchanged THRR unchanged      Progression   Progression Continue to progress workloads to maintain intensity without signs/symptoms of physical distress. Continue to progress workloads to maintain intensity without signs/symptoms of physical distress. Continue to progress workloads to maintain intensity without signs/symptoms of physical distress. Continue to progress workloads to maintain intensity without signs/symptoms of physical distress.      Resistance Training   Training Prescription Yes Yes Yes Yes    Weight orange bands orange bands orange bands orange bands  Reps 10-15 10-15 10-15 10-15    Time 10 Minutes 10 Minutes 10 Minutes 10 Minutes      Interval Training   Interval Training No No No No      NuStep   Level _0 Minutes 17 34 34 34    METs  - 2.1 2 2.1      Track   Laps  - 8  - 3    Minutes  - 17  - 17       Exercise Comments:   Exercise Goals and Review:     Exercise Goals    Row Name 01/25/17 1431             Exercise Goals   Increase Physical Activity Yes       Intervention Provide advice, education, support and counseling about physical activity/exercise needs.;Develop an individualized exercise prescription for aerobic and resistive training based on initial evaluation findings, risk stratification, comorbidities and participant's personal goals.       Expected Outcomes Achievement of increased cardiorespiratory fitness and enhanced flexibility, muscular endurance  and strength shown through measurements of functional capacity and personal statement of participant.       Increase Strength and Stamina Yes       Intervention Provide advice, education, support and counseling about physical activity/exercise needs.;Develop an individualized exercise prescription for aerobic and resistive training based on initial evaluation findings, risk stratification, comorbidities and participant's personal goals.       Expected Outcomes Achievement of increased cardiorespiratory fitness and enhanced flexibility, muscular endurance and strength shown through measurements of functional capacity and personal statement of participant.          Exercise Goals Re-Evaluation :     Exercise Goals Re-Evaluation    Row Name 02/16/17 7591 03/15/17 1421 04/12/17 1135         Exercise Goal Re-Evaluation   Exercise Goals Review Increase Physical Activity;Increase Strenth and Stamina Increase Physical Activity;Increase Strenth and Stamina Increase Physical Activity;Increase Strenth and Stamina     Comments Patient has only attended one exercise session. Will cont. to monitor and progress as able.  Patient is progressing slowly. Is limited by his extreme shortness of breath. 1.5-1.8 METS on stepper. Pursed lip breathing highly encouraged. Will cont to monitor and progress as able. Patient has been out of rehab since 03/30/17. MET levels have increase to 2.0-2.5. Will cont to monitor and progress as able. Home exercise prescription has not yet been administered due to not being physically ready to exercise at home.      Expected Outcomes Patient will cont. to increase endurance and stamina through the exercises here at rehab. He will also practice his breathing techinques to become less short of breath on exertion.  Through exercising at rehab and at home the patient will increase strength and stamina which will make ADL's easier to preform. Through exercising at rehab and at home, patient  will increase physical strength and stamina and find ADL's easier to perform.         Discharge Exercise Prescription (Final Exercise Prescription Changes):     Exercise Prescription Changes - 03/30/17 1500      Response to Exercise   Blood Pressure (Admit) 129/71   Blood Pressure (Exercise) 122/70   Blood Pressure (Exit) 100/70   Heart Rate (Admit) 92 bpm   Heart Rate (Exercise) 118 bpm   Heart Rate (Exit) 95 bpm   Oxygen Saturation (Admit) 95 %   Oxygen  Saturation (Exercise) 92 %   Oxygen Saturation (Exit) 95 %   Rating of Perceived Exertion (Exercise) 15   Perceived Dyspnea (Exercise) 3   Duration Progress to 45 minutes of aerobic exercise without signs/symptoms of physical distress   Intensity THRR unchanged     Progression   Progression Continue to progress workloads to maintain intensity without signs/symptoms of physical distress.     Resistance Training   Training Prescription Yes   Weight orange bands   Reps 10-15   Time 10 Minutes     Interval Training   Interval Training No     NuStep   Level 4   Minutes 34   METs 2.1     Track   Laps 3   Minutes 17      Nutrition:  Target Goals: Understanding of nutrition guidelines, daily intake of sodium <1556m, cholesterol <2038m calories 30% from fat and 7% or less from saturated fats, daily to have 5 or more servings of fruits and vegetables.  Biometrics:     Pre Biometrics - 01/25/17 1430      Pre Biometrics   Height 6' 4.25" (1.937 m)   Weight 217 lb 2.5 oz (98.5 kg)   BMI (Calculated) 26.3   Grip Strength 30 kg       Nutrition Therapy Plan and Nutrition Goals:     Nutrition Therapy & Goals - 03/18/17 1533      Nutrition Therapy   Diet Carb Modified, Therapeutic Lifestyle Changes     Personal Nutrition Goals   Nutrition Goal Wt loss of 1-2 lb/week to a wt loss goal of 6-20 lb at graduation from Pulmonary Rehab.    Personal Goal #2 Pt to identify and limit food sources of sodium     Personal Goal #3 The pt will consider grocery shopping on-line for food when necessary so that nourishing foods are always available at home     Intervention Plan   Intervention Prescribe, educate and counsel regarding individualized specific dietary modifications aiming towards targeted core components such as weight, hypertension, lipid management, diabetes, heart failure and other comorbidities.   Expected Outcomes Short Term Goal: Understand basic principles of dietary content, such as calories, fat, sodium, cholesterol and nutrients.;Long Term Goal: Adherence to prescribed nutrition plan.      Nutrition Discharge: Rate Your Plate Scores:     Nutrition Assessments - 03/18/17 1533      Rate Your Plate Scores   Pre Score 44      Nutrition Goals Re-Evaluation:     Nutrition Goals Re-Evaluation    Row Name 05/18/17 1602             Goals   Current Weight 210 lb 12.2 oz (95.6 kg)       Nutrition Goal Wt loss of 1-2 lb/week to a wt loss goal of 6-20 lb at graduation from Pulmonary Rehab.        Comment Pt wt is down 5 lb since admission. Wt loss goal of 6-20 lb not met. Unable to evaluate other nutrition goals; have not received post-nutrition survey at this time.          Personal Goal #2 Re-Evaluation   Personal Goal #2 Pt to identify and limit food sources of sodium          Personal Goal #3 Re-Evaluation   Personal Goal #3 The pt will consider grocery shopping on-line for food when necessary so that nourishing foods are always available at home  Nutrition Goals Discharge (Final Nutrition Goals Re-Evaluation):     Nutrition Goals Re-Evaluation - 05/18/17 1602      Goals   Current Weight 210 lb 12.2 oz (95.6 kg)   Nutrition Goal Wt loss of 1-2 lb/week to a wt loss goal of 6-20 lb at graduation from Pulmonary Rehab.    Comment Pt wt is down 5 lb since admission. Wt loss goal of 6-20 lb not met. Unable to evaluate other nutrition goals; have not received  post-nutrition survey at this time.      Personal Goal #2 Re-Evaluation   Personal Goal #2 Pt to identify and limit food sources of sodium      Personal Goal #3 Re-Evaluation   Personal Goal #3 The pt will consider grocery shopping on-line for food when necessary so that nourishing foods are always available at home      Psychosocial: Target Goals: Acknowledge presence or absence of significant depression and/or stress, maximize coping skills, provide positive support system. Participant is able to verbalize types and ability to use techniques and skills needed for reducing stress and depression.  Initial Review & Psychosocial Screening:     Initial Psych Review & Screening - 01/25/17 1511      Initial Review   Current issues with Current Stress Concerns   Source of Stress Concerns Chronic Illness     Family Dynamics   Good Support System? Yes     Barriers   Psychosocial barriers to participate in program There are no identifiable barriers or psychosocial needs.     Screening Interventions   Interventions Encouraged to exercise      Quality of Life Scores:   PHQ-9: Recent Review Flowsheet Data    Depression screen [facility] 2/9 01/25/2017 04/30/2016   Decreased Interest 2 0   Down, Depressed, Hopeless 3 0   PHQ - 2 Score 5 0   Altered sleeping 1 -   Tired, decreased energy 3 -   Change in appetite 0 -   Feeling bad or failure about yourself  3 -   Trouble concentrating 0 -   Moving slowly or fidgety/restless 0 -   Suicidal thoughts 0 -   PHQ-9 Score 12 -   Difficult doing work/chores Somewhat difficult -     Interpretation of Total Score  Total Score Depression Severity:  1-4 = Minimal depression, 5-9 = Mild depression, 10-14 = Moderate depression, 15-19 = Moderately severe depression, 20-27 = Severe depression   Psychosocial Evaluation and Intervention:     Psychosocial Evaluation - 01/25/17 1515      Psychosocial Evaluation & Interventions   Continue  Psychosocial Services  Follow up required by counselor      Psychosocial Re-Evaluation:     Psychosocial Re-Evaluation    [place] Name 02/15/17 1155 03/16/17 1612 04/15/17 0910         Psychosocial Re-Evaluation   Current issues with Current Stress Concerns Current Stress Concerns  -     Comments Feels stressed due to his chronic illness, is presently seeing a counselor to help deal with these feelings. hoping PR will improve his health his health is a concern for him, has been absent for 2 weeks due to a COPD exacerbation     Expected Outcomes Hopefully exercise will improve his quality of life by improving his strength and stamina  continue counselling and exercise continue counselling and exercise when returns to program     Interventions Encouraged to attend Pulmonary Rehabilitation for the exercise  is  also seeing a counselor Encouraged to attend Pulmonary Rehabilitation for the exercise  -     Continue Psychosocial Services  Follow up required by counselor Follow up required by counselor Follow up required by staff       Initial Review   Source of Stress Concerns Chronic Illness Chronic Illness  -        Psychosocial Discharge (Final Psychosocial Re-Evaluation):     Psychosocial Re-Evaluation - 04/15/17 0910      Psychosocial Re-Evaluation   Comments his health is a concern for him, has been absent for 2 weeks due to a COPD exacerbation   Expected Outcomes continue counselling and exercise when returns to program   Continue Psychosocial Services  Follow up required by staff      Education: Education Goals: Education classes will be provided on a weekly basis, covering required topics. Participant will state understanding/return demonstration of topics presented.  Learning Barriers/Preferences:     Learning Barriers/Preferences - 01/25/17 1510      Learning Barriers/Preferences   Learning Barriers None   Learning Preferences Computer/Internet;Written Material;Verbal  Instruction;Individual Instruction;Group Instruction      Education Topics: Risk Factor Reduction:  -Group instruction that is supported by a PowerPoint presentation. Instructor discusses the definition of a risk factor, different risk factors for pulmonary disease, and how the heart and lungs work together.     Nutrition for Pulmonary Patient:  -Group instruction provided by PowerPoint slides, verbal discussion, and written materials to support subject matter. The instructor gives an explanation and review of healthy diet recommendations, which includes a discussion on weight management, recommendations for fruit and vegetable consumption, as well as protein, fluid, caffeine, fiber, sodium, sugar, and alcohol. Tips for eating when patients are short of breath are discussed.   Pursed Lip Breathing:  -Group instruction that is supported by demonstration and informational handouts. Instructor discusses the benefits of pursed lip and diaphragmatic breathing and detailed demonstration on how to preform both.     Oxygen Safety:  -Group instruction provided by PowerPoint, verbal discussion, and written material to support subject matter. There is an overview of "What is Oxygen" and "Why do we need it".  Instructor also reviews how to create a safe environment for oxygen use, the importance of using oxygen as prescribed, and the risks of noncompliance. There is a brief discussion on traveling with oxygen and resources the patient may utilize.   Oxygen Equipment:  -Group instruction provided by [facility] Staff utilizing handouts, written materials, and equipment demonstrations.   Signs and Symptoms:  -Group instruction provided by written material and verbal discussion to support subject matter. Warning signs and symptoms of infection, stroke, and heart attack are reviewed and when to call the physician/911 reinforced. Tips for preventing the spread of infection discussed.   PULMONARY REHAB  CHRONIC OBSTRUCTIVE PULMONARY DISEASE from 03/25/2017 in [place]  Date  03/11/17  Educator  RN  Instruction Review Code  2- meets goals/outcomes      Advanced Directives:  -Group instruction provided by verbal instruction and written material to support subject matter. Instructor reviews Advanced Directive laws and proper instruction for filling out document.   Pulmonary Video:  -Group video education that reviews the importance of medication and oxygen compliance, exercise, good nutrition, pulmonary hygiene, and pursed lip and diaphragmatic breathing for the pulmonary patient.   PULMONARY REHAB CHRONIC OBSTRUCTIVE PULMONARY DISEASE from 03/25/2017 in [place]  Date  02/18/17  Instruction Review  Code  2- meets goals/outcomes      Exercise for the Pulmonary Patient:  -Group instruction that is supported by a PowerPoint presentation. Instructor discusses benefits of exercise, core components of exercise, frequency, duration, and intensity of an exercise routine, importance of utilizing pulse oximetry during exercise, safety while exercising, and options of places to exercise outside of rehab.     PULMONARY REHAB CHRONIC OBSTRUCTIVE PULMONARY DISEASE from 03/25/2017 in [place]  Date  02/25/17  Educator  ep  Instruction Review Code  2- meets goals/outcomes      Pulmonary Medications:  -Verbally interactive group education provided by instructor with focus on inhaled medications and proper administration.   PULMONARY REHAB CHRONIC OBSTRUCTIVE PULMONARY DISEASE from 03/25/2017 in [place]  Date  03/25/17  Educator  Pharm  Instruction Review Code  2- meets goals/outcomes      Anatomy and Physiology of the Respiratory System and Intimacy:  -Group instruction provided by PowerPoint, verbal discussion, and written material to support subject matter.  Instructor reviews respiratory cycle and anatomical components of the respiratory system and their functions. Instructor also reviews differences in obstructive and restrictive respiratory diseases with examples of each. Intimacy, Sex, and Sexuality differences are reviewed with a discussion on how relationships can change when diagnosed with pulmonary disease. Common sexual concerns are reviewed.   PULMONARY REHAB CHRONIC OBSTRUCTIVE PULMONARY DISEASE from 03/25/2017 in [place]  Date  03/18/17  Educator  RN  Instruction Review Code  2- meets goals/outcomes      MD DAY -A group question and answer session with a medical doctor that allows participants to ask questions that relate to their pulmonary disease state.   OTHER EDUCATION -Group or individual verbal, written, or video instructions that support the educational goals of the pulmonary rehab program.   Knowledge Questionnaire Score:     Knowledge Questionnaire Score - 04/14/17 1510      Knowledge Questionnaire Score   Pre Score 10/13      Core Components/Risk Factors/Patient Goals at Admission:     Personal Goals and Risk Factors at Admission - 01/25/17 1515      Core Components/Risk Factors/Patient Goals on Admission    Weight Management Yes   Intervention Weight Management: Develop a combined nutrition and exercise program designed to reach desired caloric intake, while maintaining appropriate intake of nutrient and fiber, sodium and fats, and appropriate energy expenditure required for the weight goal.   Expected Outcomes Short Term: Continue to assess and modify interventions until short term weight is achieved;Weight Loss: Understanding of general recommendations for a balanced deficit meal plan, which promotes 1-2 lb weight loss per week and includes a negative energy balance of [phone] kcal/d;Understanding recommendations for meals to include 15-35% energy as protein, 25-35% energy from  fat, 35-60% energy from carbohydrates, less than '200mg'$  of dietary cholesterol, 20-35 gm of total fiber daily;Understanding of distribution of calorie intake throughout the day with the consumption of 4-5 meals/snacks   Improve shortness of breath with ADL's Yes   Intervention Provide education, individualized exercise plan and daily activity instruction to help decrease symptoms of SOB with activities of daily living.   Expected Outcomes Short Term: Achieves a reduction of symptoms when performing activities of daily living.   Develop more efficient breathing techniques such as purse lipped breathing and diaphragmatic breathing; and practicing self-pacing with activity Yes   Intervention Provide education, demonstration and support about specific breathing techniuqes  utilized for more efficient breathing. Include techniques such as pursed lipped breathing, diaphragmatic breathing and self-pacing activity.   Expected Outcomes Short Term: Participant will be able to demonstrate and use breathing techniques as needed throughout daily activities.   Intervention Refer participants experiencing significant psychosocial distress to appropriate mental health specialists for further evaluation and treatment. When possible, include family members and significant others in education/counseling sessions.;Offer individual and/or small group education and counseling on adjustment to heart disease, stress management and health-related lifestyle change. Teach and support self-help strategies.   Expected Outcomes Long Term: Emotional wellbeing is indicated by absence of clinically significant psychosocial distress or social isolation.;Short Term: Participant demonstrates changes in health-related behavior, relaxation and other stress management skills, ability to obtain effective social support, and compliance with psychotropic medications if prescribed.      Core Components/Risk Factors/Patient Goals Review:       Goals and Risk Factor Review    Row Name 02/15/17 1151 03/16/17 1608 04/15/17 0907 05/18/17 1604       Core Components/Risk Factors/Patient Goals Review   Personal Goals Review Weight Management/Obesity;Improve shortness of breath with ADL's;Develop more efficient breathing techniques such as purse lipped breathing and diaphragmatic breathing and practicing self-pacing with activity.;Stress Develop more efficient breathing techniques such as purse lipped breathing and diaphragmatic breathing and practicing self-pacing with activity. Develop more efficient breathing techniques such as purse lipped breathing and diaphragmatic breathing and practicing self-pacing with activity.;Improve shortness of breath with ADL's;Increase knowledge of respiratory medications and ability to use respiratory devices properly. Weight Management/Obesity    Review Just started program, has only attended 1 exercise session, too early to have met any goals Has stage 4 COPD, very limited re: his ability to exercise and balance his SOB.  PLB reinforced with each session, little to no progression.  Great attendance! Presently is being treated for a COPD exacerbation and has been absent since 03/30/17.  Little to no progression before this. Pt has lost 5 lb since admission, which is desired. Pt's wt loss goal of 6-20 lb not met.     Expected Outcomes Expect to be more independent with exercise and feeling more comfortable in the exercise setting Continue with plan and hope for continued very slow progress.   Continue to work with patient with strengthening and PLB. Slow wt loss of 1-2 lb/week       Core Components/Risk Factors/Patient Goals at Discharge (Final Review):      Goals and Risk Factor Review - 05/18/17 1604      Core Components/Risk Factors/Patient Goals Review   Personal Goals Review Weight Management/Obesity   Review Pt has lost 5 lb since admission, which is desired. Pt's wt loss goal of 6-20 lb not met.     Expected Outcomes Slow wt loss of 1-2 lb/week      ITP Comments:   Comments: ITP REVIEW Pt was discharged from pulmonary rehab after attending 14 exercise sessions.  He dropped out after not seeing any improvement in his COPD.  He has severe COPD.

## 2017-05-18 NOTE — Addendum Note (Signed)
Encounter addended by: Jacques EarthlyFranko, Mohanad Carsten Brewbaker, RD on: 05/18/2017  4:07 PM<BR>    Actions taken: Flowsheet data copied forward, Visit Navigator Flowsheet section accepted

## 2017-05-20 ENCOUNTER — Encounter (HOSPITAL_COMMUNITY): Payer: Medicare Other

## 2017-05-25 ENCOUNTER — Encounter (HOSPITAL_COMMUNITY): Payer: Medicare Other

## 2017-06-08 DIAGNOSIS — J449 Chronic obstructive pulmonary disease, unspecified: Secondary | ICD-10-CM | POA: Diagnosis not present

## 2017-06-11 ENCOUNTER — Other Ambulatory Visit: Payer: Self-pay | Admitting: Pulmonary Disease

## 2017-06-11 ENCOUNTER — Telehealth: Payer: Self-pay

## 2017-06-11 NOTE — Telephone Encounter (Signed)
Spoke with pt, aware of results/recs.  Nothing further needed.  

## 2017-06-11 NOTE — Telephone Encounter (Signed)
-----   Message from Lupita Leashouglas B McQuaid, MD sent at 06/11/2017  3:28 PM EDT ----- A, Please let him know that his ONO on RA was OK. Thanks, B

## 2017-06-15 IMAGING — DX DG RIBS 2V*R*
4 series · 4 of 4 positions shown · non-contrast
Comparison: 12/25/2016

CLINICAL DATA: Right rib pain for 4 days after coughing

EXAM:
RIGHT RIBS - 2 VIEW

[rib pa]
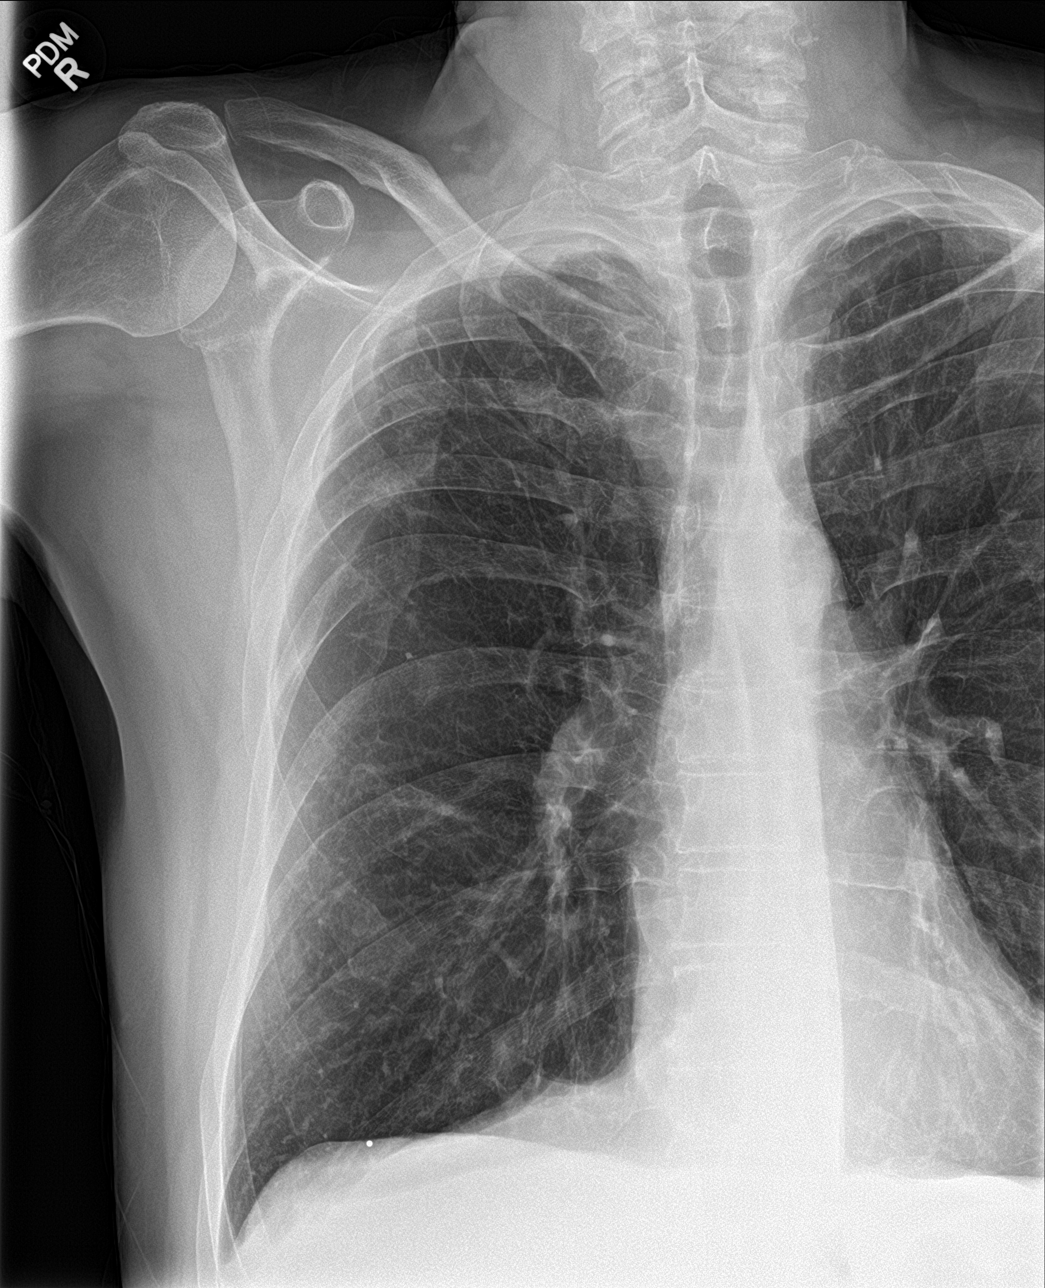

[rib ap]
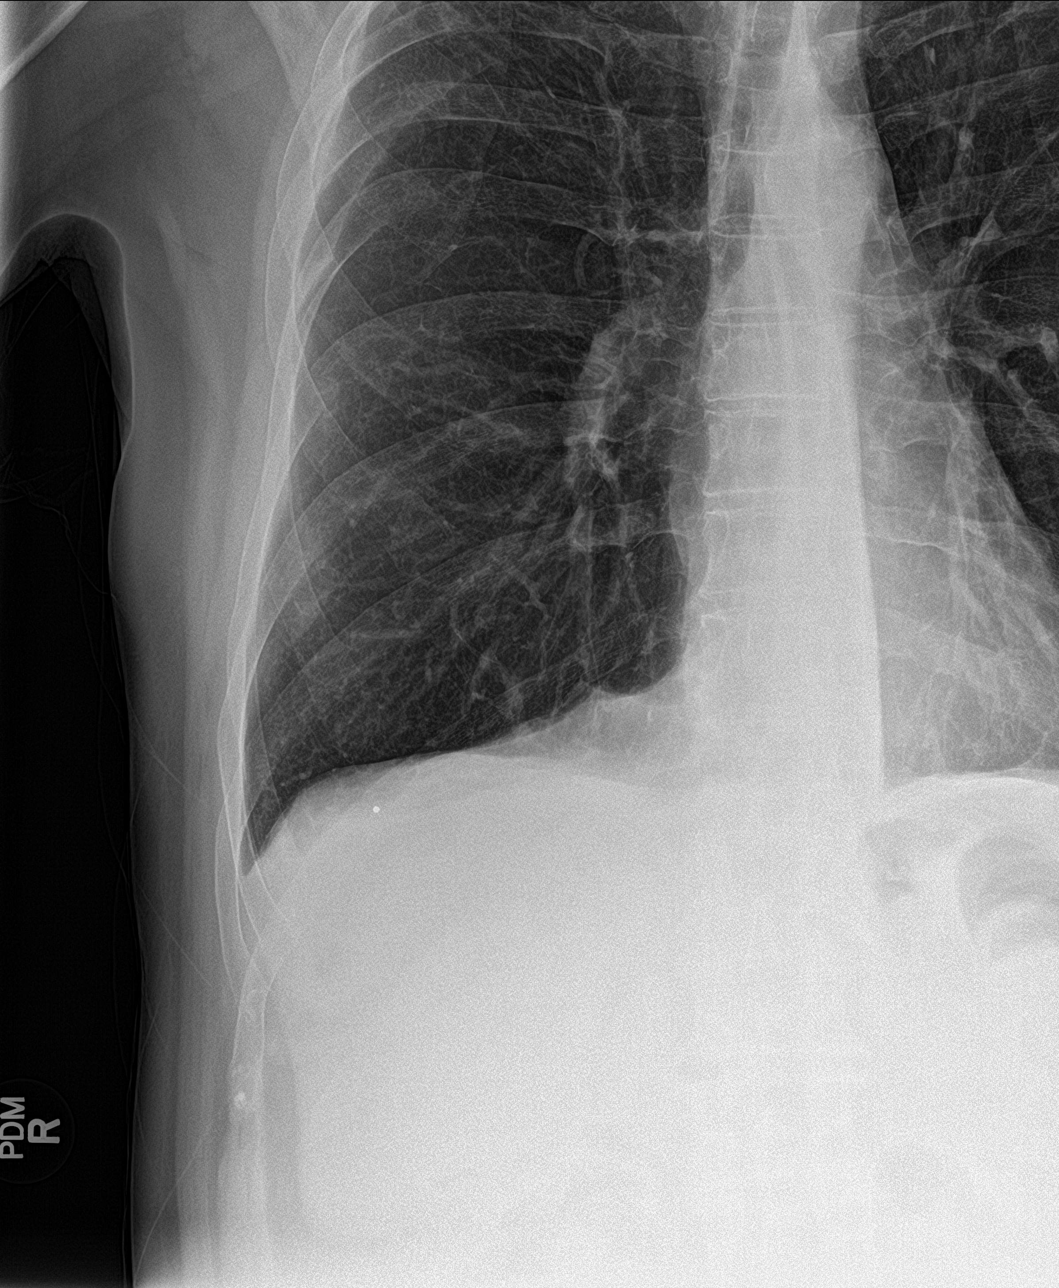

[rib obl (1 of 2)]
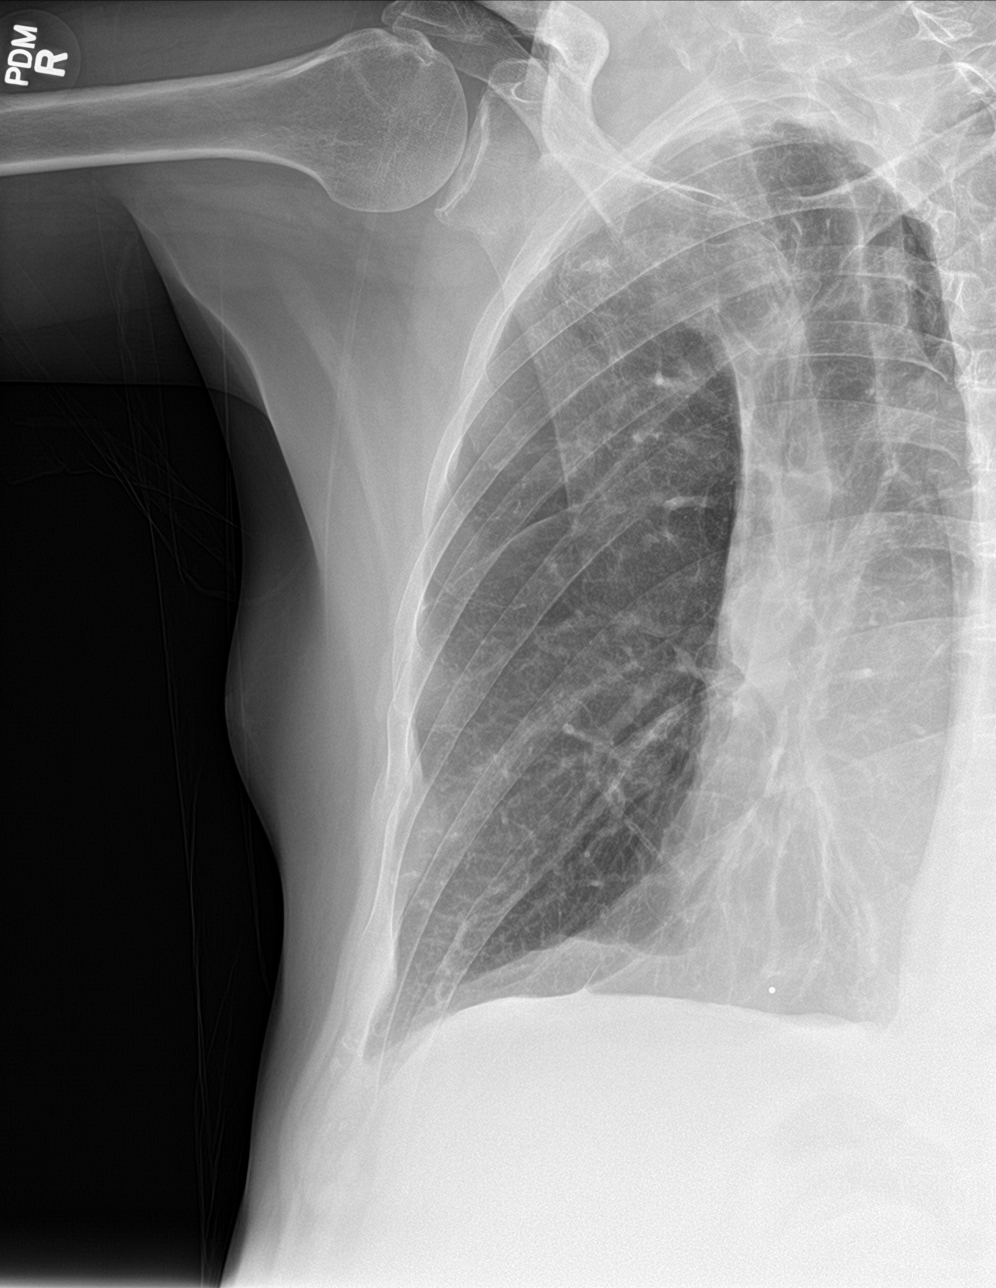

[rib obl (2 of 2)]
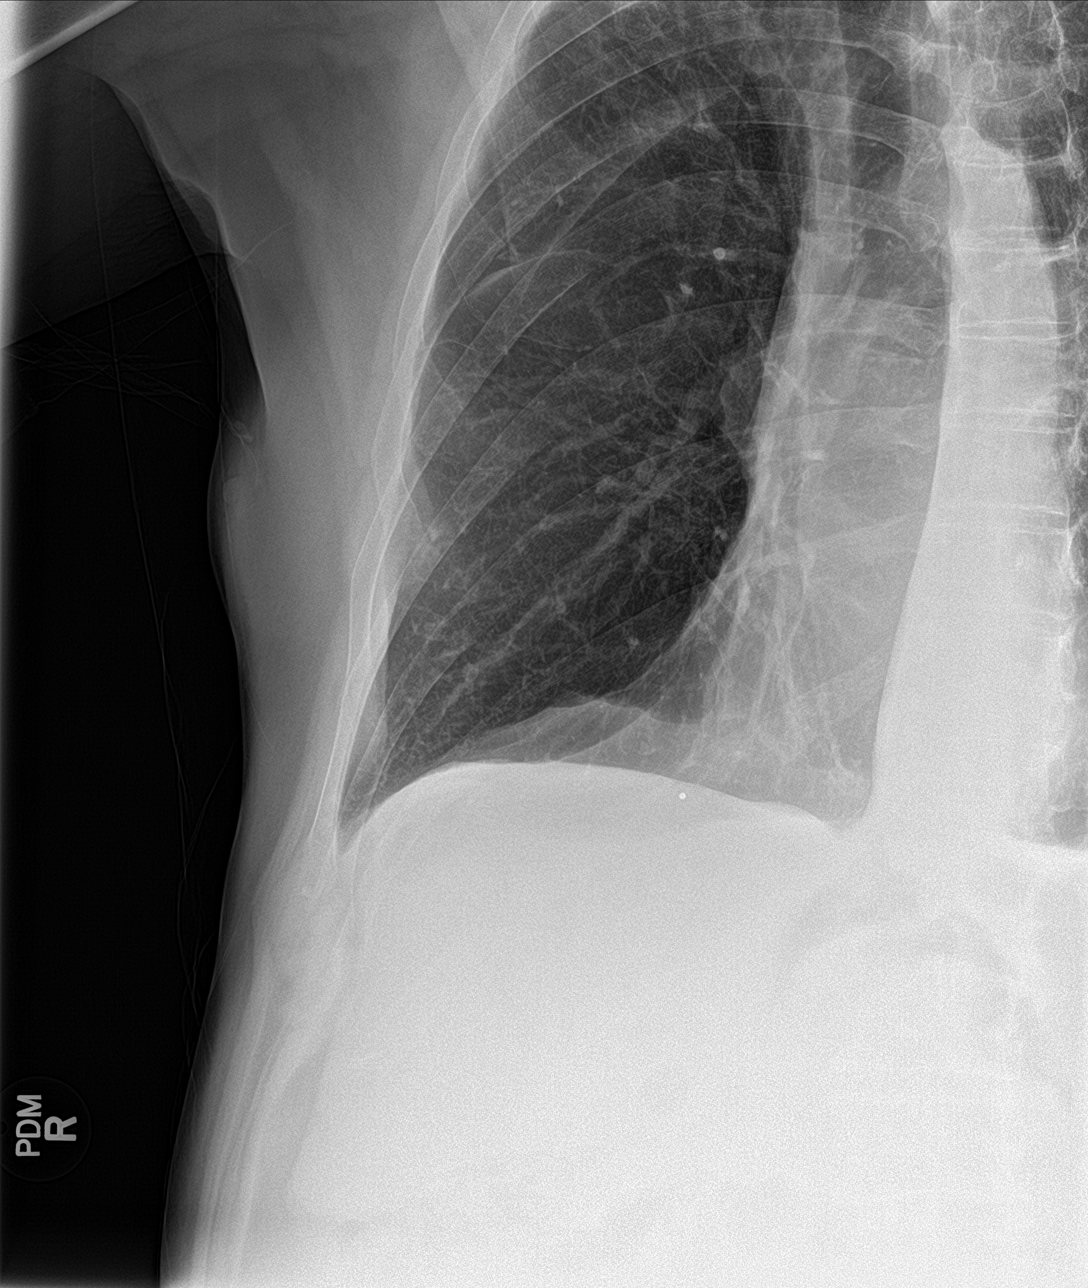

[4 of 4 positions shown; findings below may reference images not displayed]

FINDINGS: Cardiomediastinal silhouette is stable. No infiltrate or pulmonary
edema. No definite posterior rib fracture is identified. Question
subtle nondisplaced fracture of the right anterior sixth rib . No
pneumothorax.
IMPRESSION: No infiltrate or pulmonary edema. No definite posterior rib fracture
is identified. Question subtle nondisplaced fracture of the right
anterior sixth rib . No pneumothorax. Clinical correlation is
necessary.

## 2017-06-24 ENCOUNTER — Other Ambulatory Visit: Payer: Self-pay | Admitting: Family

## 2017-07-06 ENCOUNTER — Encounter: Payer: Self-pay | Admitting: Internal Medicine

## 2017-07-06 ENCOUNTER — Ambulatory Visit (INDEPENDENT_AMBULATORY_CARE_PROVIDER_SITE_OTHER): Payer: Medicare Other | Admitting: Internal Medicine

## 2017-07-06 VITALS — BP 128/70 | HR 97 | Ht 76.0 in | Wt 209.6 lb

## 2017-07-06 DIAGNOSIS — Z7901 Long term (current) use of anticoagulants: Secondary | ICD-10-CM | POA: Diagnosis not present

## 2017-07-06 DIAGNOSIS — J449 Chronic obstructive pulmonary disease, unspecified: Secondary | ICD-10-CM | POA: Diagnosis not present

## 2017-07-06 DIAGNOSIS — I2601 Septic pulmonary embolism with acute cor pulmonale: Secondary | ICD-10-CM | POA: Diagnosis not present

## 2017-07-06 DIAGNOSIS — I499 Cardiac arrhythmia, unspecified: Secondary | ICD-10-CM | POA: Diagnosis not present

## 2017-07-06 DIAGNOSIS — I2510 Atherosclerotic heart disease of native coronary artery without angina pectoris: Secondary | ICD-10-CM | POA: Diagnosis not present

## 2017-07-06 NOTE — Progress Notes (Signed)
OFFICE CONSULT NOTE  Chief Complaint:  Possible a-fib  Primary Care Physician: [name], FNP  HPI:  Theodore [name] is a 61 y.o. male who is being seen today for the evaluation of possible a-fib at the request of Dr. Kendrick Fries. Theodore [name] is a 61 year old male with a history of COPD and non-STEMI in 2015. Recently he's had worsening COPD and is on home oxygen and 10 mg of prednisone daily along with the usual inhalers and nebulizers at home. He has had several recent COPD exacerbations in which EMS was called. Once his heart rate is racing and felt to be irregular, concerning for possible atrial fibrillation. This was not definitively diagnosed. He's also had several other episodes of irregularity to his heart rate. At times he says he has bradycardia as well with heart rates as low as the 40s but has not had any episodes like that recently. He denies any chest pain. Today significantly short of breath. He has used his inhalers this morning but is coughing. Recently an echo showed normal systolic function with mild pulmonary hypertension an EF of 65-70% in June 2018. Biatrial size was normal. EKG in the office today shows a sinus arrhythmia at a rate of 97. 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 He is not currently on any AV nodal blocking medications.   PMHx:  Past Medical History:  Diagnosis Date  . Anxiety   . Arthritis    "back" (01/24/2014)  . Asthma   . Chronic bronchitis (HCC)   . Cluster headache    "last bout was summer 2014; had them q spring 1991-2001" (01/24/2014)  . COPD (chronic obstructive pulmonary disease) (HCC)   . GERD (gastroesophageal reflux disease)   . NSTEMI (non-ST elevated myocardial infarction) (HCC) 01/2014  . On home oxygen therapy    "2L; 24/7" (01/24/2014)  . Pneumonia 06/2013; 01/2014  . Pulmonary embolism (HCC) 01/23/2014  . Seasonal allergies     Past Surgical History:  Procedure Laterality Date  .  COLONOSCOPY WITH PROPOFOL N/A 10/20/2016   Procedure: COLONOSCOPY WITH PROPOFOL;  Surgeon: Sherrilyn Rist, MD;  Location: [facility];  Service: Gastroenterology;  Laterality: N/A;  . NASAL SEPTOPLASTY W/ TURBINOPLASTY Left 1983  . TURBINATE REDUCTION Bilateral ~ 1983    FAMHx:  Family History  Problem Relation Age of Onset  . Leukemia Father   . Heart disease Father   . Asthma Father   . Asthma Sister   . Emphysema Sister   . Heart disease Sister   . High blood pressure Mother   . Colon cancer Cousin        First cousin    SOCHx:   reports that he quit smoking about 5 years ago. His smoking use included Cigarettes. He has a 70.00 pack-year smoking history. He has never used smokeless tobacco. He reports that he drinks alcohol. He reports that he does not use drugs.  ALLERGIES:  Allergies  Allergen Reactions  . Naproxen Sodium Itching    ROS: Pertinent items noted in HPI and remainder of comprehensive ROS otherwise negative.  HOME MEDS: Current Outpatient Prescriptions on File Prior to Visit  Medication Sig Dispense Refill  . albuterol (PROAIR HFA) 108 (90 Base) MCG/ACT inhaler Inhale 2 puffs into the lungs every 6 (six) hours as needed for wheezing or shortness of breath. 1 Inhaler 3  . albuterol (PROVENTIL) (2.5 MG/3ML) 0.083% nebulizer solution Take 2.5 mg by nebulization every 6 (  six) hours as needed for wheezing or shortness of breath.    . fluticasone (FLONASE) 50 MCG/ACT nasal spray Place 2 sprays into both nostrils daily.    Marland Kitchen guaiFENesin (MUCINEX) 600 MG 12 hr tablet Take 1,200 mg by mouth 2 (two) times daily.     . Multiple Vitamin (MULTIVITAMIN WITH MINERALS) TABS tablet Take 1 tablet by mouth 3 (three) times a week.    . nicotine polacrilex (NICORETTE) 4 MG gum Take 4 mg by mouth daily as needed for smoking cessation.    . predniSONE (DELTASONE) 10 MG tablet Take 1 tablet (10 mg total) by mouth daily with breakfast. 30 tablet 2  . predniSONE (DELTASONE) 10 MG  tablet Take 1 tablet (10 mg total) by mouth daily with breakfast. 30 tablet 2  . rivaroxaban (XARELTO) 20 MG TABS tablet Take 1 tablet (20 mg total) by mouth daily with supper. 30 tablet 6  . sodium chloride (OCEAN) 0.65 % SOLN nasal spray Place 1 spray into both nostrils 4 (four) times daily as needed for congestion.    Marland Kitchen SPIRIVA HANDIHALER 18 MCG inhalation capsule INHALE THE CONTENTS OF 1 CAPSULE VIA INHALATION DEVICE EVERY DAY 30 capsule 5  . SYMBICORT 160-4.5 MCG/ACT inhaler TAKE 2 PUFFS FIRST THING IN THE MORNING AND THEN ANOTHER 2 PUFFS ABOUT 12 HOURS LATER 10.2 g 5  . triamterene-hydrochlorothiazide (MAXZIDE-25) 37.5-25 MG tablet TAKE 1 TABLET BY MOUTH DAILY 30 tablet 0   No current facility-administered medications on file prior to visit.     LABS/IMAGING: No results found for this or any previous visit (from the past 48 hour(s)). No results found.  LIPID PANEL:    Component Value Date/Time   CHOL 217 (H) 04/30/2016 0909   TRIG 102.0 04/30/2016 0909   HDL 61.70 04/30/2016 0909   CHOLHDL 4 04/30/2016 0909   VLDL 20.4 04/30/2016 0909   LDLCALC 135 (H) 04/30/2016 0909    WEIGHTS: Wt Readings from Last 3 Encounters:  07/06/17 209 lb 9.6 oz (95.1 kg)  04/29/17 213 lb 6.4 oz (96.8 kg)  03/30/17 211 lb 13.8 oz (96.1 kg)    VITALS: BP 128/70   Pulse 97   Ht 6\' 4"  (1.93 m)   Wt 209 lb 9.6 oz (95.1 kg)   BMI 25.51 kg/m   EXAM: General appearance: alert, moderate distress and Accessory muscle use Neck: no carotid bruit, no JVD and thyroid not enlarged, symmetric, no tenderness/mass/nodules Lungs: diminished breath sounds bilaterally Heart: regular rate and rhythm Abdomen: soft, non-tender; bowel sounds normal; no masses,  no organomegaly Extremities: extremities normal, atraumatic, no cyanosis or edema Pulses: 2+ and symmetric Skin: Pale, cool, dry Neurologic: Mental status: Alert, oriented, thought content appropriate Psych: Pleasant  EKG: Sinus rhythm with marked  sinus arrhythmia 97 - personally reviewed  ASSESSMENT: 1. Possible atrial fibrillation versus MAT 2. Severe COPD on oral steroids and oxygen 3. History of non-STEMI 4. 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   Theodore [name] may be having paroxysmal atrial fibrillation. He has sinus arrhythmia today and could also have MAT. This may be difficult to distinguish on monitoring. We certainly could consider monitoring however it is unlikely to change management. He has had recurrent PE and will likely be on Xarelto lifelong. He reports some increase in heart rate but mostly associated with exertion. He does not seem to be more symptomatic with this tachycardia than it abnormal heart rate. If necessary, he could use low-dose diltiazem for some rate control. He  would rather not do monitoring due to cost issues. I think it's reasonable to continue him on the Xarelto and consider using diltiazem as needed. I did not anticipate an antiarrhythmic medication and therefore would not see a concern for him been on long-term a azithromycin. QTC is 408 ms.  Thanks for the kind referral. Please let me know if I can be of any further assistance. Follow-up with me as needed.  Chrystie Nose, MD, [facility]  [place]  [facility]  Attending Cardiologist  Direct Dial: [phone]  Fax: [phone]  Website:  [email or web address] Nicely Dong Nimmons 07/06/2017, 5:03 PM

## 2017-07-06 NOTE — Patient Instructions (Signed)
Your physician recommends that you schedule a follow-up appointment as needed with Dr. Hilty.  

## 2017-07-14 ENCOUNTER — Inpatient Hospital Stay (HOSPITAL_COMMUNITY)
Admission: EM | Admit: 2017-07-14 | Discharge: 2017-07-21 | DRG: 470 | Disposition: A | Discharge status: Skilled Nursing Facility | Payer: Medicare Other | Attending: Nephrology | Admitting: Nephrology

## 2017-07-14 ENCOUNTER — Emergency Department (HOSPITAL_COMMUNITY): Payer: Medicare Other

## 2017-07-14 ENCOUNTER — Encounter (HOSPITAL_COMMUNITY): Payer: Self-pay | Admitting: *Deleted

## 2017-07-14 DIAGNOSIS — Z7901 Long term (current) use of anticoagulants: Secondary | ICD-10-CM

## 2017-07-14 DIAGNOSIS — E871 Hypo-osmolality and hyponatremia: Secondary | ICD-10-CM | POA: Diagnosis present

## 2017-07-14 DIAGNOSIS — R1319 Other dysphagia: Secondary | ICD-10-CM | POA: Diagnosis not present

## 2017-07-14 DIAGNOSIS — Z86711 Personal history of pulmonary embolism: Secondary | ICD-10-CM | POA: Diagnosis not present

## 2017-07-14 DIAGNOSIS — R262 Difficulty in walking, not elsewhere classified: Secondary | ICD-10-CM | POA: Diagnosis not present

## 2017-07-14 DIAGNOSIS — K567 Ileus, unspecified: Secondary | ICD-10-CM | POA: Diagnosis not present

## 2017-07-14 DIAGNOSIS — E119 Type 2 diabetes mellitus without complications: Secondary | ICD-10-CM | POA: Diagnosis not present

## 2017-07-14 DIAGNOSIS — Z87891 Personal history of nicotine dependence: Secondary | ICD-10-CM | POA: Diagnosis not present

## 2017-07-14 DIAGNOSIS — S79911A Unspecified injury of right hip, initial encounter: Secondary | ICD-10-CM | POA: Diagnosis not present

## 2017-07-14 DIAGNOSIS — I10 Essential (primary) hypertension: Secondary | ICD-10-CM | POA: Diagnosis not present

## 2017-07-14 DIAGNOSIS — J45909 Unspecified asthma, uncomplicated: Secondary | ICD-10-CM | POA: Diagnosis not present

## 2017-07-14 DIAGNOSIS — Z7952 Long term (current) use of systemic steroids: Secondary | ICD-10-CM

## 2017-07-14 DIAGNOSIS — Z23 Encounter for immunization: Secondary | ICD-10-CM | POA: Diagnosis not present

## 2017-07-14 DIAGNOSIS — S72012A Unspecified intracapsular fracture of left femur, initial encounter for closed fracture: Secondary | ICD-10-CM | POA: Diagnosis not present

## 2017-07-14 DIAGNOSIS — S72002A Fracture of unspecified part of neck of left femur, initial encounter for closed fracture: Secondary | ICD-10-CM | POA: Diagnosis not present

## 2017-07-14 DIAGNOSIS — S72002D Fracture of unspecified part of neck of left femur, subsequent encounter for closed fracture with routine healing: Secondary | ICD-10-CM | POA: Diagnosis not present

## 2017-07-14 DIAGNOSIS — Z8249 Family history of ischemic heart disease and other diseases of the circulatory system: Secondary | ICD-10-CM

## 2017-07-14 DIAGNOSIS — D72829 Elevated white blood cell count, unspecified: Secondary | ICD-10-CM | POA: Diagnosis present

## 2017-07-14 DIAGNOSIS — I459 Conduction disorder, unspecified: Secondary | ICD-10-CM | POA: Diagnosis present

## 2017-07-14 DIAGNOSIS — M6281 Muscle weakness (generalized): Secondary | ICD-10-CM | POA: Diagnosis not present

## 2017-07-14 DIAGNOSIS — E876 Hypokalemia: Secondary | ICD-10-CM

## 2017-07-14 DIAGNOSIS — E118 Type 2 diabetes mellitus with unspecified complications: Secondary | ICD-10-CM | POA: Diagnosis not present

## 2017-07-14 DIAGNOSIS — I2510 Atherosclerotic heart disease of native coronary artery without angina pectoris: Secondary | ICD-10-CM | POA: Diagnosis not present

## 2017-07-14 DIAGNOSIS — R05 Cough: Secondary | ICD-10-CM | POA: Diagnosis not present

## 2017-07-14 DIAGNOSIS — I252 Old myocardial infarction: Secondary | ICD-10-CM | POA: Diagnosis not present

## 2017-07-14 DIAGNOSIS — Z79899 Other long term (current) drug therapy: Secondary | ICD-10-CM

## 2017-07-14 DIAGNOSIS — J441 Chronic obstructive pulmonary disease with (acute) exacerbation: Secondary | ICD-10-CM | POA: Diagnosis present

## 2017-07-14 DIAGNOSIS — Y92009 Unspecified place in unspecified non-institutional (private) residence as the place of occurrence of the external cause: Secondary | ICD-10-CM

## 2017-07-14 DIAGNOSIS — F17200 Nicotine dependence, unspecified, uncomplicated: Secondary | ICD-10-CM | POA: Diagnosis not present

## 2017-07-14 DIAGNOSIS — J9611 Chronic respiratory failure with hypoxia: Secondary | ICD-10-CM | POA: Diagnosis present

## 2017-07-14 DIAGNOSIS — Z96641 Presence of right artificial hip joint: Secondary | ICD-10-CM | POA: Diagnosis not present

## 2017-07-14 DIAGNOSIS — S51012A Laceration without foreign body of left elbow, initial encounter: Secondary | ICD-10-CM | POA: Diagnosis present

## 2017-07-14 DIAGNOSIS — W010XXA Fall on same level from slipping, tripping and stumbling without subsequent striking against object, initial encounter: Secondary | ICD-10-CM | POA: Diagnosis present

## 2017-07-14 DIAGNOSIS — Z419 Encounter for procedure for purposes other than remedying health state, unspecified: Secondary | ICD-10-CM

## 2017-07-14 DIAGNOSIS — Z96642 Presence of left artificial hip joint: Secondary | ICD-10-CM | POA: Diagnosis not present

## 2017-07-14 DIAGNOSIS — R0602 Shortness of breath: Secondary | ICD-10-CM | POA: Diagnosis not present

## 2017-07-14 DIAGNOSIS — Z7951 Long term (current) use of inhaled steroids: Secondary | ICD-10-CM

## 2017-07-14 DIAGNOSIS — T148XXA Other injury of unspecified body region, initial encounter: Secondary | ICD-10-CM | POA: Diagnosis not present

## 2017-07-14 DIAGNOSIS — J449 Chronic obstructive pulmonary disease, unspecified: Secondary | ICD-10-CM | POA: Diagnosis not present

## 2017-07-14 DIAGNOSIS — I2782 Chronic pulmonary embolism: Secondary | ICD-10-CM

## 2017-07-14 DIAGNOSIS — Z96649 Presence of unspecified artificial hip joint: Secondary | ICD-10-CM

## 2017-07-14 DIAGNOSIS — M25552 Pain in left hip: Secondary | ICD-10-CM | POA: Diagnosis not present

## 2017-07-14 DIAGNOSIS — Z825 Family history of asthma and other chronic lower respiratory diseases: Secondary | ICD-10-CM

## 2017-07-14 DIAGNOSIS — K219 Gastro-esophageal reflux disease without esophagitis: Secondary | ICD-10-CM | POA: Diagnosis present

## 2017-07-14 DIAGNOSIS — R0689 Other abnormalities of breathing: Secondary | ICD-10-CM | POA: Diagnosis not present

## 2017-07-14 DIAGNOSIS — R140 Abdominal distension (gaseous): Secondary | ICD-10-CM | POA: Diagnosis not present

## 2017-07-14 DIAGNOSIS — R0600 Dyspnea, unspecified: Secondary | ICD-10-CM

## 2017-07-14 DIAGNOSIS — Z806 Family history of leukemia: Secondary | ICD-10-CM | POA: Diagnosis not present

## 2017-07-14 DIAGNOSIS — M12552 Traumatic arthropathy, left hip: Secondary | ICD-10-CM | POA: Diagnosis not present

## 2017-07-14 DIAGNOSIS — S72009A Fracture of unspecified part of neck of unspecified femur, initial encounter for closed fracture: Secondary | ICD-10-CM | POA: Diagnosis not present

## 2017-07-14 DIAGNOSIS — Z471 Aftercare following joint replacement surgery: Secondary | ICD-10-CM | POA: Diagnosis not present

## 2017-07-14 LAB — CBC WITH DIFFERENTIAL/PLATELET
Basophils Absolute: 0 10*3/uL (ref 0.0–0.1)
Basophils Relative: 0 %
Eosinophils Absolute: 0.1 10*3/uL (ref 0.0–0.7)
Eosinophils Relative: 1 %
HCT: 42.9 % (ref 39.0–52.0)
Hemoglobin: 15.3 g/dL (ref 13.0–17.0)
Lymphocytes Relative: 10 %
Lymphs Abs: 1.8 10*3/uL (ref 0.7–4.0)
MCH: 32 pg (ref 26.0–34.0)
MCHC: 35.7 g/dL (ref 30.0–36.0)
MCV: 89.7 fL (ref 78.0–100.0)
Monocytes Absolute: 0.6 10*3/uL (ref 0.1–1.0)
Monocytes Relative: 4 %
Neutro Abs: 15.2 10*3/uL — ABNORMAL HIGH (ref 1.7–7.7)
Neutrophils Relative %: 85 %
Platelets: 364 10*3/uL (ref 150–400)
RBC: 4.78 MIL/uL (ref 4.22–5.81)
RDW: 13.6 % (ref 11.5–15.5)
WBC: 17.7 10*3/uL — ABNORMAL HIGH (ref 4.0–10.5)

## 2017-07-14 LAB — TROPONIN I: Troponin I: <0.03 ng/mL (ref <0.03)

## 2017-07-14 LAB — BASIC METABOLIC PANEL
Anion gap: 9 (ref 5–15)
BUN: 12 mg/dL (ref 6–20)
CO2: 30 mmol/L (ref 22–32)
Calcium: 9 mg/dL (ref 8.9–10.3)
Chloride: 94 mmol/L — ABNORMAL LOW (ref 101–111)
Creatinine, Ser: 0.99 mg/dL (ref 0.61–1.24)
GFR calc Af Amer: >60 mL/min (ref >60)
GFR calc non Af Amer: >60 mL/min (ref >60)
Glucose, Bld: 99 mg/dL (ref 65–99)
Potassium: 3.4 mmol/L — ABNORMAL LOW (ref 3.5–5.1)
Sodium: 133 mmol/L — ABNORMAL LOW (ref 135–145)

## 2017-07-14 MED ORDER — SALINE SPRAY 0.65 % NA SOLN
1.0000 | Freq: Four times a day (QID) | NASAL | Status: DC | PRN
  Administered 2017-07-19: 1 via NASAL
  Filled 2017-07-14 (×2): qty 44

## 2017-07-14 MED ORDER — MOMETASONE FURO-FORMOTEROL FUM 200-5 MCG/ACT IN AERO
2.0000 | INHALATION_SPRAY | Freq: Two times a day (BID) | RESPIRATORY_TRACT | Status: DC
Start: 2017-07-14 — End: 2017-07-21
  Administered 2017-07-15 – 2017-07-21 (×12): 2 via RESPIRATORY_TRACT
  Filled 2017-07-14 (×2): qty 8.8

## 2017-07-14 MED ORDER — HYDROCODONE-ACETAMINOPHEN 5-325 MG PO TABS
1.0000 | ORAL_TABLET | Freq: Four times a day (QID) | ORAL | Status: DC | PRN
  Administered 2017-07-14 – 2017-07-17 (×6): 2 via ORAL
  Administered 2017-07-18 – 2017-07-19 (×2): 1 via ORAL
  Filled 2017-07-14 (×3): qty 2
  Filled 2017-07-14: qty 1
  Filled 2017-07-14 (×4): qty 2

## 2017-07-14 MED ORDER — DOCUSATE SODIUM 100 MG PO CAPS
100.0000 mg | ORAL_CAPSULE | Freq: Two times a day (BID) | ORAL | Status: DC
  Administered 2017-07-14 – 2017-07-21 (×12): 100 mg via ORAL
  Filled 2017-07-14 (×12): qty 1

## 2017-07-14 MED ORDER — POTASSIUM CHLORIDE IN NACL 40-0.9 MEQ/L-% IV SOLN
INTRAVENOUS | Status: DC
  Administered 2017-07-14: 50 mL/h via INTRAVENOUS
  Filled 2017-07-14 (×2): qty 1000

## 2017-07-14 MED ORDER — POTASSIUM CHLORIDE CRYS ER 20 MEQ PO TBCR
40.0000 meq | EXTENDED_RELEASE_TABLET | Freq: Once | ORAL | Status: AC
  Administered 2017-07-14: 40 meq via ORAL
  Filled 2017-07-14: qty 2

## 2017-07-14 MED ORDER — TETANUS-DIPHTH-ACELL PERTUSSIS 5-2.5-18.5 LF-MCG/0.5 IM SUSP
0.5000 mL | Freq: Once | INTRAMUSCULAR | Status: AC
  Administered 2017-07-14: 0.5 mL via INTRAMUSCULAR
  Filled 2017-07-14: qty 0.5

## 2017-07-14 MED ORDER — SODIUM CHLORIDE 0.9 % IV SOLN
INTRAVENOUS | Status: DC
  Administered 2017-07-14: 50 mL/h via INTRAVENOUS

## 2017-07-14 MED ORDER — MORPHINE SULFATE (PF) 2 MG/ML IV SOLN
0.5000 mg | INTRAVENOUS | Status: DC | PRN
  Administered 2017-07-15 (×2): 0.5 mg via INTRAVENOUS
  Filled 2017-07-14 (×2): qty 1

## 2017-07-14 MED ORDER — LIDOCAINE HCL (PF) 1 % IJ SOLN
5.0000 mL | Freq: Once | INTRAMUSCULAR | Status: AC
  Administered 2017-07-14: 5 mL
  Filled 2017-07-14: qty 30

## 2017-07-14 MED ORDER — TIOTROPIUM BROMIDE MONOHYDRATE 18 MCG IN CAPS
18.0000 ug | ORAL_CAPSULE | Freq: Every day | RESPIRATORY_TRACT | Status: DC
  Administered 2017-07-15: 18 ug via RESPIRATORY_TRACT
  Filled 2017-07-14: qty 5

## 2017-07-14 MED ORDER — GUAIFENESIN ER 600 MG PO TB12
1200.0000 mg | ORAL_TABLET | Freq: Two times a day (BID) | ORAL | Status: DC
  Administered 2017-07-14 – 2017-07-18 (×8): 1200 mg via ORAL
  Filled 2017-07-14 (×9): qty 2

## 2017-07-14 MED ORDER — PREDNISONE 10 MG PO TABS
10.0000 mg | ORAL_TABLET | Freq: Every day | ORAL | Status: DC
  Administered 2017-07-14 – 2017-07-16 (×3): 10 mg via ORAL
  Filled 2017-07-14 (×3): qty 1

## 2017-07-14 MED ORDER — ALBUTEROL SULFATE (2.5 MG/3ML) 0.083% IN NEBU
2.5000 mg | INHALATION_SOLUTION | RESPIRATORY_TRACT | Status: DC | PRN
  Administered 2017-07-15 – 2017-07-21 (×2): 2.5 mg via RESPIRATORY_TRACT
  Filled 2017-07-14 (×2): qty 3

## 2017-07-14 MED ORDER — MORPHINE SULFATE (PF) 4 MG/ML IV SOLN
4.0000 mg | INTRAVENOUS | Status: DC | PRN
  Administered 2017-07-14: 4 mg via INTRAVENOUS
  Filled 2017-07-14: qty 1

## 2017-07-14 MED ORDER — IPRATROPIUM-ALBUTEROL 0.5-2.5 (3) MG/3ML IN SOLN
3.0000 mL | RESPIRATORY_TRACT | Status: DC | PRN
  Administered 2017-07-14 – 2017-07-17 (×6): 3 mL via RESPIRATORY_TRACT
  Filled 2017-07-14 (×6): qty 3

## 2017-07-14 NOTE — Care Management (Signed)
ED CM reviewed CM consult. [facility] is being admitted [facility]. Care Management will continue to follow for discharge needs. Rubie Maidrystal Uchenna Seufert RN CCM

## 2017-07-14 NOTE — ED Triage Notes (Signed)
Per EMS pt form home with c/o tripping over a fan and falling on his left side. Pt c/o left hip pain, per EMS no obvious deformity, pulses intact. Denies LOC denies head injury.  Pt is on blood thinners

## 2017-07-14 NOTE — ED Notes (Signed)
Respiratory contacted for duoneb tx

## 2017-07-14 NOTE — H&P (Addendum)
History and Physical    Theodore GuestJames W [name] BMW:[id number] DOB: 1956-04-27 DOA: 07/14/2017  PCP: Theodore Speakalone, Gregory D, FNP  Patient coming from:Home  Chief Complaint:fall and left hip pain  HPI: Theodore [name] is a 61 y.o. male with medical history significant of COPD, recently taken off off oxygen by his pulmonologist, coronary artery disease, GERD and anxiety, pulmonary embolism on xarelto, presented after a fall sustaining left hip pain. Patient reported that he was feeling good until he tripped on the floor fan and fell on the ground. He fell on the left side and caused left hip pain. He reported that it was accidental. Denied headache, dizziness, loss of consciousness, chest pain, shortness of breath, nausea, vomiting, abdominal pain. No fever, chills, dysuria, urgency, diarrhea or constipation. He was using 2 L of oxygen in the night until 2 months ago when the oxygen was taken off by his pulmonologist.   ED Course: In the ER, found to have WBC of 17.7, potassium 3.4, sodium 133. X-ray of hip consistent with a comminuted subcapital left femoral neck fracture. Admitted for further evaluation. Arthur consulted.  Review of Systems: As per HPI otherwise 10 point review of systems negative.    Past Medical History:  Diagnosis Date  . Anxiety   . Arthritis    "back" (01/24/2014)  . Asthma   . Chronic bronchitis (HCC)   . Cluster headache    "last bout was summer 2014; had them q spring 1991-2001" (01/24/2014)  . COPD (chronic obstructive pulmonary disease) (HCC)   . GERD (gastroesophageal reflux disease)   . NSTEMI (non-ST elevated myocardial infarction) (HCC) 01/2014  . On home oxygen therapy    "2L; 24/7" (01/24/2014)  . Pneumonia 06/2013; 01/2014  . Pulmonary embolism (HCC) 01/23/2014  . Seasonal allergies     Past Surgical History:  Procedure Laterality Date  . COLONOSCOPY WITH PROPOFOL N/A 10/20/2016   Procedure: COLONOSCOPY WITH PROPOFOL;  Surgeon: Sherrilyn RistHenry L Danis III, MD;  Location: [facility]  ENDOSCOPY;  Service: Gastroenterology;  Laterality: N/A;  . NASAL SEPTOPLASTY W/ TURBINOPLASTY Left 1983  . TURBINATE REDUCTION Bilateral ~ 1983     Social History: reports that he quit smoking about 5 years ago. His smoking use included Cigarettes. He has a 70.00 pack-year smoking history. He has never used smokeless tobacco. He reports that he drinks alcohol. He reports that he does not use drugs.  Allergies  Allergen Reactions  . Naproxen Sodium Itching    Family History  Problem Relation Age of Onset  . Leukemia Father   . Heart disease Father   . Asthma Father   . Asthma Sister   . Emphysema Sister   . Heart disease Sister   . High blood pressure Mother   . Colon cancer Cousin        First cousin     Prior to Admission medications   Medication Sig Start Date End Date Taking? Authorizing Provider  albuterol (PROAIR HFA) 108 (90 Base) MCG/ACT inhaler Inhale 2 puffs into the lungs every 6 (six) hours as needed for wheezing or shortness of breath. 04/29/17  Yes McQuaid, Brooke Paceouglas B, MD  albuterol (PROVENTIL) (2.5 MG/3ML) 0.083% nebulizer solution Take 2.5 mg by nebulization every 6 (six) hours as needed for wheezing or shortness of breath.   Yes [provider]  guaiFENesin (MUCINEX) 600 MG 12 hr tablet Take 1,200 mg by mouth 2 (two) times daily.    Yes [provider]  nicotine polacrilex (NICORETTE) 4 MG gum Take 4  mg by mouth daily as needed for smoking cessation.   Yes [provider]  predniSONE (DELTASONE) 10 MG tablet Take 1 tablet (10 mg total) by mouth daily with breakfast. 06/11/17  Yes Lupita Leash, MD  rivaroxaban (XARELTO) 20 MG TABS tablet Take 1 tablet (20 mg total) by mouth daily with supper. 12/23/16  Yes Bevelyn Ngo, NP  sodium chloride (OCEAN) 0.65 % SOLN nasal spray Place 1 spray into both nostrils 4 (four) times daily as needed for congestion.   Yes [provider]  SPIRIVA HANDIHALER 18 MCG inhalation capsule INHALE THE  CONTENTS OF 1 CAPSULE VIA INHALATION DEVICE EVERY DAY 02/01/17  Yes Lupita Leash, MD  SYMBICORT 160-4.5 MCG/ACT inhaler TAKE 2 PUFFS FIRST THING IN THE MORNING AND THEN ANOTHER 2 PUFFS ABOUT 12 HOURS LATER 02/01/17  Yes Lupita Leash, MD  triamterene-hydrochlorothiazide (MAXZIDE-25) 37.5-25 MG tablet TAKE 1 TABLET BY MOUTH DAILY 06/24/17  Yes Theodore Speak, FNP  predniSONE (DELTASONE) 10 MG tablet Take 1 tablet (10 mg total) by mouth daily with breakfast. Patient not taking: Reported on 07/14/2017 05/18/17   Lupita Leash, MD    Physical Exam: Vitals:   07/14/17 1019  BP: 114/88  Pulse: 88  Resp: 18  Temp: 98.2 F (36.8 C)  TempSrc: Oral  SpO2: 92%      Constitutional: NAD, calm, comfortable Vitals:   07/14/17 1019  BP: 114/88  Pulse: 88  Resp: 18  Temp: 98.2 F (36.8 C)  TempSrc: Oral  SpO2: 92%   Eyes: PERRL, lids and conjunctivae normal ENMT: Mucous membranes are dry. Normal dentition.  Neck: normal, supple Respiratory: clear to auscultation bilaterally, no wheezing, no crackles. Normal respiratory effort. No accessory muscle use.  Cardiovascular: Regular rate and rhythm, S1S2 Normal. no murmurs / rubs / gallops. No extremity edema.   Abdomen: Soft, Non-tender, non-distended. Bowel sounds positive.  Musculoskeletal: Left leg externally rotated. No edema, no cyanosis. Left elbow has a small laceration on the dorsal side. Neurologic: CN 2-12 grossly intact. Sensation intact.   Psychiatric: Normal judgment and insight. Alert and oriented x 3. Normal mood.    Labs on Admission: I have personally reviewed following labs and imaging studies  CBC:  Recent Labs Lab 07/14/17 1101  WBC 17.7*  NEUTROABS 15.2*  HGB 15.3  HCT 42.9  MCV 89.7  PLT 364   Basic Metabolic Panel:  Recent Labs Lab 07/14/17 1101  NA 133*  K 3.4*  CL 94*  CO2 30  GLUCOSE 99  BUN 12  CREATININE 0.99  CALCIUM 9.0   GFR: Estimated Creatinine Clearance: 93.8 mL/min (by  C-G formula based on SCr of 0.99 mg/dL). Liver Function Tests: No results for input(s): AST, ALT, ALKPHOS, BILITOT, PROT, ALBUMIN in the last 168 hours. No results for input(s): LIPASE, AMYLASE in the last 168 hours. No results for input(s): AMMONIA in the last 168 hours. Coagulation Profile: No results for input(s): INR, PROTIME in the last 168 hours. Cardiac Enzymes: No results for input(s): CKTOTAL, CKMB, CKMBINDEX, TROPONINI in the last 168 hours. BNP (last 3 results) No results for input(s): PROBNP in the last 8760 hours. HbA1C: No results for input(s): HGBA1C in the last 72 hours. CBG: No results for input(s): GLUCAP in the last 168 hours. Lipid Profile: No results for input(s): CHOL, HDL, LDLCALC, TRIG, CHOLHDL, LDLDIRECT in the last 72 hours. Thyroid Function Tests: No results for input(s): TSH, T4TOTAL, FREET4, T3FREE, THYROIDAB in the last 72 hours. Anemia Panel: No results  for input(s): VITAMINB12, FOLATE, FERRITIN, TIBC, IRON, RETICCTPCT in the last 72 hours. Urine analysis:    Component Value Date/Time   COLORURINE YELLOW 05/17/2008 2303   APPEARANCEUR CLEAR 05/17/2008 2303   LABSPEC 1.024 05/17/2008 2303   PHURINE 6.5 05/17/2008 2303   GLUCOSEU NEGATIVE 05/17/2008 2303   HGBUR TRACE (A) 05/17/2008 2303   BILIRUBINUR NEGATIVE 05/17/2008 2303   KETONESUR NEGATIVE 05/17/2008 2303   PROTEINUR NEGATIVE 05/17/2008 2303   UROBILINOGEN 1.0 05/17/2008 2303   NITRITE NEGATIVE 05/17/2008 2303   LEUKOCYTESUR NEGATIVE 05/17/2008 2303   Sepsis Labs: !!!!!!!!!!!!!!!!!!!!!!!!!!!!!!!!!!!!!!!!!!!! @LABRCNTIP (procalcitonin:4,lacticidven:4) )No results found for this or any previous visit (from the past 240 hour(s)).   Radiological Exams on Admission: Dg Chest 1 View  Result Date: 07/14/2017 CLINICAL DATA:  Pain following fall.  Cough. EXAM: CHEST 1 VIEW COMPARISON:  December 28, 2016 FINDINGS: There is slight upper lobe scarring bilaterally. There is no edema or consolidation.  Heart size and pulmonary vascularity are normal. No adenopathy. No pneumothorax. No acute fracture evident. IMPRESSION: Mild upper lobe scarring.  No edema or consolidation. Electronically Signed   By: Bretta Bang III M.[name].   On: 07/14/2017 10:59   Dg Hip Unilat With Pelvis 2-3 Views Left  Result Date: 07/14/2017 CLINICAL DATA:  Fall.  Left hip pain. EXAM: DG HIP (WITH OR WITHOUT PELVIS) 2-3V LEFT COMPARISON:  None. FINDINGS: There is a comminuted subcapital left femoral neck fracture with mild 4 mm lateral displacement of the dominant distal fracture fragment, with mild impaction and with minimal apex anterior/ lateral angulation. No additional fracture. No left hip dislocation. No suspicious focal osseous lesions. No pelvic diastasis. Vascular calcifications throughout the soft tissues. No radiopaque foreign body. IMPRESSION: Comminuted subcapital left femoral neck fracture as detailed. Electronically Signed   By: Delbert Phenix M.[name].   On: 07/14/2017 10:54    EKG: Ordered  Assessment/Plan  #  Closed displaced fracture of left femoral neck [facility]): Patient reported fall after he tripped over the floor fan. He denied any preceding sign or symptoms. No loss of consciousness. It looks like it is mechanical fall. -Orthopedics consulted and I discussed with Dr.Xu. He recommended to keep nothing by mouth from midnight and transferred to [facility] for possible surgical intervention in the morning. -Start diet now and nothing by mouth from midnight -Gentle IV hydration and supportive care -Pain management ordered, bowel regimen -EKG ordered, follow-up -order PT/INR/PTT  -The patient had echo in June 2018 with EF of 65-70% with normal wall motion. He does not have chest pain or shortness of breath. Follow-up troponin and EKG. -Xarelto on hold now.   # History of pulmonary embolism twice: Xarelto on hold now. Resume after Orthopedics surgery and plan.  # History of COPD: Patient is not  hypoxic. Continue bronchodilators. DuoNeb added. Chest x-ray with  mild upper lobe scaring with no edema or consolidation. Continue chronic prednisone, Mucinex. Do not think patient needs stress dose.  #History of hypertension: Blood pressure acceptable. Her I will hold Maxzide today, before the surgery.  # Small left elbow laceration on the dorsal area.he is able to move. As per Dr. Juleen China he is going to suture the wound. Continue wound care.  # Hypokalemia and mild hyponatremia: Replete potassium chloride oral and IV. Repeat lab in the morning.  #Leukocytosis likely stress related after the fall/fracture and may also be due to chronic prednisone. Patient is afebrile. Monitor lab.  I discussed with the patient and he agreed with the plan. I also  discussed with the ER physician.  DVT prophylaxis: SCD. No anticoagulation because of surgical intervention tomorrow Code Status: Full code Family Communication: No family member at bedside in the ER Disposition Plan: Transferred to [facility] as brought to Consults called: Orthopedics Admission status: Inpatient   Vedder Brittian Jaynie Collins MD [facility] Pager [phone]  If 7PM-7AM, please contact night-coverage [email or web address] Password [facility]  07/14/2017, 12:39 PM

## 2017-07-14 NOTE — ED Notes (Signed)
BLOOD DRAW ATTEMPTED X2 UNSUCCESSFUL 

## 2017-07-14 NOTE — ED Notes (Signed)
Patient transported to X-ray 

## 2017-07-14 NOTE — ED Provider Notes (Signed)
[facility]-EMERGENCY DEPT Provider Note   CSN: [id number] Arrival date & time: 07/14/17  1007     History   Chief Complaint Chief Complaint  Patient presents with  . Fall  . Hip Pain    HPI Theodore [name] is a 61 y.o. male.  HPI   61 year old male with left hip pain. Mechanical fall prior to arrival. He states he tripped over a box fan and lost his balance. He fell onto his left side. Severe and persistent hip pain since then. Tolerable at rest. Much worse with any attempted range of motion. Also, small laceration to his left elbow. He is on the Route though. He does not think he hit his head. No acute headaches, neck or back pain. Unsure of his last tetanus shot.  Past Medical History:  Diagnosis Date  . Anxiety   . Arthritis    "back" (01/24/2014)  . Asthma   . Chronic bronchitis (HCC)   . Cluster headache    "last bout was summer 2014; had them q spring 1991-2001" (01/24/2014)  . COPD (chronic obstructive pulmonary disease) (HCC)   . GERD (gastroesophageal reflux disease)   . NSTEMI (non-ST elevated myocardial infarction) (HCC) 01/2014  . On home oxygen therapy    "2L; 24/7" (01/24/2014)  . Pneumonia 06/2013; 01/2014  . Pulmonary embolism (HCC) 01/23/2014  . Seasonal allergies     Patient Active Problem List   Diagnosis Date Noted  . Cardiac arrhythmia 07/06/2017  . Costochondral chest pain 12/28/2016  . Bilateral lower extremity edema 12/28/2016  . Chronic anticoagulation 12/23/2016  . DOE (dyspnea on exertion) 12/03/2016  . Chest pain 12/03/2016  . Diabetes mellitus with complication (HCC)   . Pulmonary embolism on right (HCC)   . Anxiety state 10/30/2015  . Routine general medical examination at a health care facility 09/03/2014  . Back pain 09/03/2014  . Nausea without vomiting 08/22/2014  . HBP (high blood pressure) 01/29/2014  . Acute pulmonary embolism (HCC) 01/23/2014  . Pulmonary embolism (HCC) 01/23/2014  . CAD (coronary artery disease) 01/23/2014  . Acute  respiratory failure (HCC) 01/23/2014  . HCAP (healthcare-associated pneumonia) 01/20/2014  . Chronic respiratory failure assoc with cor pulmonale 01/20/2014  . Acute cor pulmonale (HCC) 01/12/2014  . NSTEMI (non-ST elevated myocardial infarction) (HCC) 01/11/2014  . DM (diabetes mellitus) (HCC) 01/10/2014  . Multiple lung nodules 10/24/2013  . PNA (pneumonia) 06/25/2013  . COPD exacerbation (HCC) 06/13/2013  . Smoking 03/03/2012  . COPD GOLD IV  09/28/2011    Past Surgical History:  Procedure Laterality Date  . COLONOSCOPY WITH PROPOFOL N/A 10/20/2016   Procedure: COLONOSCOPY WITH PROPOFOL;  Surgeon: Sherrilyn Rist, MD;  Location: [facility] ENDOSCOPY;  Service: Gastroenterology;  Laterality: N/A;  . NASAL SEPTOPLASTY W/ TURBINOPLASTY Left 1983  . TURBINATE REDUCTION Bilateral ~ 1983       Home Medications    Prior to Admission medications   Medication Sig Start Date End Date Taking? Authorizing Provider  albuterol (PROAIR HFA) 108 (90 Base) MCG/ACT inhaler Inhale 2 puffs into the lungs every 6 (six) hours as needed for wheezing or shortness of breath. 04/29/17  Yes McQuaid, Brooke Pace, MD  albuterol (PROVENTIL) (2.5 MG/3ML) 0.083% nebulizer solution Take 2.5 mg by nebulization every 6 (six) hours as needed for wheezing or shortness of breath.   Yes [provider]  guaiFENesin (MUCINEX) 600 MG 12 hr tablet Take 1,200 mg by mouth 2 (two) times daily.    Yes [provider]  nicotine polacrilex (  NICORETTE) 4 MG gum Take 4 mg by mouth daily as needed for smoking cessation.   Yes [provider]  predniSONE (DELTASONE) 10 MG tablet Take 1 tablet (10 mg total) by mouth daily with breakfast. 06/11/17  Yes Lupita Leash, MD  rivaroxaban (XARELTO) 20 MG TABS tablet Take 1 tablet (20 mg total) by mouth daily with supper. 12/23/16  Yes Bevelyn Ngo, NP  sodium chloride (OCEAN) 0.65 % SOLN nasal spray Place 1 spray into both nostrils 4 (four) times daily as needed for  congestion.   Yes [provider]  SPIRIVA HANDIHALER 18 MCG inhalation capsule INHALE THE CONTENTS OF 1 CAPSULE VIA INHALATION DEVICE EVERY DAY 02/01/17  Yes Lupita Leash, MD  SYMBICORT 160-4.5 MCG/ACT inhaler TAKE 2 PUFFS FIRST THING IN THE MORNING AND THEN ANOTHER 2 PUFFS ABOUT 12 HOURS LATER 02/01/17  Yes Lupita Leash, MD  triamterene-hydrochlorothiazide (MAXZIDE-25) 37.5-25 MG tablet TAKE 1 TABLET BY MOUTH DAILY 06/24/17  Yes [name], FNP  predniSONE (DELTASONE) 10 MG tablet Take 1 tablet (10 mg total) by mouth daily with breakfast. Patient not taking: Reported on 07/14/2017 05/18/17   Lupita Leash, MD    Family History Family History  Problem Relation Age of Onset  . Leukemia Father   . Heart disease Father   . Asthma Father   . Asthma Sister   . Emphysema Sister   . Heart disease Sister   . High blood pressure Mother   . Colon cancer Cousin        First cousin    Social History Social History  Substance Use Topics  . Smoking status: Former Smoker    Packs/day: 2.00    Years: 35.00    Types: Cigarettes    Quit date: 01/24/2012  . Smokeless tobacco: Never Used     Comment: Remain smoke free  . Alcohol use Yes     Comment: 01/24/2014 "holidays; family birthdays"     Allergies   Naproxen sodium   Review of Systems Review of Systems  All systems reviewed and negative, other than as noted in HPI.  Physical Exam Updated Vital Signs BP 114/88   Pulse 88   Temp 98.2 F (36.8 C) (Oral)   Resp 18   SpO2 92%   Physical Exam  Constitutional: He appears well-developed and well-nourished. No distress.  HENT:  Head: Normocephalic and atraumatic.  Eyes: Conjunctivae are normal. Right eye exhibits no discharge. Left eye exhibits no discharge.  Neck: Neck supple.  Cardiovascular: Normal rate, regular rhythm and normal heart sounds.  Exam reveals no gallop and no friction rub.   No murmur heard. Pulmonary/Chest: Effort normal and breath  sounds normal. No respiratory distress.  Abdominal: Soft. He exhibits no distension. There is no tenderness.  Musculoskeletal:  Left lower extremity is externally rotated.Her pain with attempted range of motion. Closed injury. Neurovascular intact. 2.5 cm laceration just above the left elbow. Skin tear just proximal to it. No significant bony tenderness or pain with range of motion. NVI distally.   Neurological: He is alert.  Skin: Skin is warm and dry.  Psychiatric: He has a normal mood and affect. His behavior is normal. Thought content normal.  Nursing note and vitals reviewed.    ED Treatments / Results  Labs (all labs ordered are listed, but only abnormal results are displayed) Labs Reviewed  CBC WITH DIFFERENTIAL/PLATELET - Abnormal; Notable for the following:       Result Value   WBC  17.7 (*)    Neutro Abs 15.2 (*)    All other components within normal limits  BASIC METABOLIC PANEL - Abnormal; Notable for the following:    Sodium 133 (*)    Potassium 3.4 (*)    Chloride 94 (*)    All other components within normal limits  TROPONIN I  HIV ANTIBODY (ROUTINE TESTING)  CBC  BASIC METABOLIC PANEL  PROTIME-INR  APTT    EKG  EKG Interpretation None       Radiology Dg Chest 1 View  Result Date: 07/14/2017 CLINICAL DATA:  Pain following fall.  Cough. EXAM: CHEST 1 VIEW COMPARISON:  December 28, 2016 FINDINGS: There is slight upper lobe scarring bilaterally. There is no edema or consolidation. Heart size and pulmonary vascularity are normal. No adenopathy. No pneumothorax. No acute fracture evident. IMPRESSION: Mild upper lobe scarring.  No edema or consolidation. Electronically Signed   By: Bretta BangWilliam  Woodruff III M.D.   On: 07/14/2017 10:59   Dg Hip Unilat With Pelvis 2-3 Views Left  Result Date: 07/14/2017 CLINICAL DATA:  Fall.  Left hip pain. EXAM: DG HIP (WITH OR WITHOUT PELVIS) 2-3V LEFT COMPARISON:  None. FINDINGS: There is a comminuted subcapital left femoral neck  fracture with mild 4 mm lateral displacement of the dominant distal fracture fragment, with mild impaction and with minimal apex anterior/ lateral angulation. No additional fracture. No left hip dislocation. No suspicious focal osseous lesions. No pelvic diastasis. Vascular calcifications throughout the soft tissues. No radiopaque foreign body. IMPRESSION: Comminuted subcapital left femoral neck fracture as detailed. Electronically Signed   By: Delbert PhenixJason A Poff M.D.   On: 07/14/2017 10:54    Procedures Procedures (including critical care time)  LACERATION REPAIR Performed by: Raeford RazorKOHUT, Therese Rocco Authorized by: Raeford RazorKOHUT, Bengie Kaucher Consent: Verbal consent obtained. Risks and benefits: risks, benefits and alternatives were discussed Consent given by: patient Patient identity confirmed: provided demographic data Prepped and Draped in normal sterile fashion Wound explored  Laceration Location: L elbow pain  Laceration Length: 2.5 cm  No Foreign Bodies seen or palpated  Anesthesia: local infiltration  Local anesthetic: lidocaine 1% w/o epinephrine  Anesthetic total: 2 ml  Irrigation method: syringe Amount of cleaning: standard  Skin closure: 4-0 monocryl  Number of sutures: 3  Technique: simple interupted Patient tolerance: Patient tolerated the procedure well with no immediate complications.   Medications Ordered in ED Medications  morphine 4 MG/ML injection 4 mg (not administered)  0.9 %  sodium chloride infusion (not administered)  lidocaine (PF) (XYLOCAINE) 1 % injection 5 mL (not administered)  Tdap (BOOSTRIX) injection 0.5 mL (not administered)     Initial Impression / Assessment and Plan / ED Course  I have reviewed the triage vital signs and the nursing notes.  Pertinent labs & imaging results that were available during my care of the patient were reviewed by me and considered in my medical decision making (see chart for details).     Left hip fracture. NVI. Orthopedic  consultation. Medicine admission. Small elbow laceration which I will repair. Update tetanus. PRN pain meds.   Final Clinical Impressions(s) / ED Diagnoses   Final diagnoses:  Closed subcapital fracture of left femur, initial encounter (HCC)  Elbow laceration  New Prescriptions New Prescriptions   No medications on file     Raeford RazorKohut, Aladdin Kollmann, MD 07/15/17 [phone]

## 2017-07-14 NOTE — ED Notes (Signed)
Bed: [id number] Expected date:  Expected time:  Means of arrival:  Comments: EMS Fall/hip pain

## 2017-07-15 DIAGNOSIS — J449 Chronic obstructive pulmonary disease, unspecified: Secondary | ICD-10-CM

## 2017-07-15 DIAGNOSIS — M12552 Traumatic arthropathy, left hip: Secondary | ICD-10-CM

## 2017-07-15 DIAGNOSIS — S72002A Fracture of unspecified part of neck of left femur, initial encounter for closed fracture: Secondary | ICD-10-CM

## 2017-07-15 LAB — BASIC METABOLIC PANEL
Anion gap: 12 (ref 5–15)
BUN: 14 mg/dL (ref 6–20)
CO2: 22 mmol/L (ref 22–32)
CREATININE: 0.88 mg/dL (ref 0.61–1.24)
Calcium: 8.7 mg/dL — ABNORMAL LOW (ref 8.9–10.3)
Chloride: 96 mmol/L — ABNORMAL LOW (ref 101–111)
Glucose, Bld: 101 mg/dL — ABNORMAL HIGH (ref 65–99)
Potassium: 4.5 mmol/L (ref 3.5–5.1)
Sodium: 130 mmol/L — ABNORMAL LOW (ref 135–145)

## 2017-07-15 LAB — SURGICAL PCR SCREEN
MRSA, PCR: NEGATIVE
STAPHYLOCOCCUS AUREUS: NEGATIVE

## 2017-07-15 LAB — CBC
HCT: 43.5 % (ref 39.0–52.0)
HEMOGLOBIN: 14.6 g/dL (ref 13.0–17.0)
MCH: 31.6 pg (ref 26.0–34.0)
MCHC: 33.6 g/dL (ref 30.0–36.0)
MCV: 94.2 fL (ref 78.0–100.0)
Platelets: 322 10*3/uL (ref 150–400)
RBC: 4.62 MIL/uL (ref 4.22–5.81)
RDW: 14.2 % (ref 11.5–15.5)
WBC: 15 10*3/uL — ABNORMAL HIGH (ref 4.0–10.5)

## 2017-07-15 LAB — APTT: aPTT: 31 seconds (ref 24–36)

## 2017-07-15 LAB — HIV ANTIBODY (ROUTINE TESTING W REFLEX): HIV SCREEN 4TH GENERATION: NONREACTIVE

## 2017-07-15 LAB — PROTIME-INR
INR: 1.21
PROTHROMBIN TIME: 15.2 s (ref 11.4–15.2)

## 2017-07-15 MED ORDER — MORPHINE SULFATE (PF) 4 MG/ML IV SOLN
0.5000 mg | INTRAVENOUS | Status: DC | PRN
  Administered 2017-07-15 – 2017-07-16 (×6): 0.52 mg via INTRAVENOUS
  Filled 2017-07-15 (×5): qty 1

## 2017-07-15 MED ORDER — SODIUM CHLORIDE 0.9 % IV SOLN
INTRAVENOUS | Status: AC
  Administered 2017-07-15: 18:00:00 via INTRAVENOUS

## 2017-07-15 MED ORDER — MORPHINE SULFATE (PF) 4 MG/ML IV SOLN: 0.5000 mg | INTRAVENOUS | Status: DC | PRN

## 2017-07-15 MED ORDER — MORPHINE SULFATE (PF) 4 MG/ML IV SOLN
INTRAVENOUS | Status: AC
  Filled 2017-07-15: qty 1

## 2017-07-15 NOTE — Consult Note (Signed)
ORTHOPAEDIC CONSULTATION  REQUESTING PHYSICIAN: Louellen Molder, MD  Chief Complaint: Left femoral neck fracture  HPI: Theodore [name] is a 61 y.o. male who presents with left femoral neck fracture s/p mechanical fall PTA.  The patient endorses severe pain in the left hip, that does not radiate, grinding in quality, worse with any movement, better with immobilization.  Denies LOC/fever/chills/nausea/vomiting.  Walks without assistive devices (walker, cane, wheelchair).  Does live alone.  Denies LOC, neck pain, abd pain.  Has h/o PE on xarelto  Past Medical History:  Diagnosis Date  . Anxiety   . Arthritis    "back" (01/24/2014)  . Asthma   . Chronic bronchitis ([place])   . Cluster headache    "last bout was summer 2014; had them q spring 1991-2001" (01/24/2014)  . COPD (chronic obstructive pulmonary disease) ([place])   . GERD (gastroesophageal reflux disease)   . NSTEMI (non-ST elevated myocardial infarction) ([place]) 01/2014  . On home oxygen therapy    "2L; 24/7" (01/24/2014)  . Pneumonia 06/2013; 01/2014  . Pulmonary embolism ([place]) 01/23/2014  . Seasonal allergies    Past Surgical History:  Procedure Laterality Date  . COLONOSCOPY WITH PROPOFOL N/A 10/20/2016   Procedure: COLONOSCOPY WITH PROPOFOL;  Surgeon: Doran Stabler, MD;  Location: [facility];  Service: Gastroenterology;  Laterality: N/A;  . NASAL SEPTOPLASTY W/ TURBINOPLASTY Left 1983  . TURBINATE REDUCTION Bilateral ~ 1983   Social History   Social History  . Marital status: Divorced    Spouse name: N/A  . Number of children: 0  . Years of education: 14   Occupational History  . [employer]   Social History Main Topics  . Smoking status: Former Smoker    Packs/day: 2.00    Years: 35.00    Types: Cigarettes    Quit date: 01/24/2012  . Smokeless tobacco: Never Used     Comment: Remain smoke free  . Alcohol use Yes     Comment: 01/24/2014 "holidays; family birthdays"  . Drug use: No  .  Sexual activity: No   Other Topics Concern  . None   Social History Narrative   Born in [place], [place] and moved around a lot. Dad was an Chief Financial Officer and moved frequently.         Family History  Problem Relation Age of Onset  . Leukemia Father   . Heart disease Father   . Asthma Father   . Asthma Sister   . Emphysema Sister   . Heart disease Sister   . High blood pressure Mother   . Colon cancer Cousin        First cousin   Allergies  Allergen Reactions  . Naproxen Sodium Itching   Prior to Admission medications   Medication Sig Start Date End Date Taking? Authorizing Provider  albuterol (PROAIR HFA) 108 (90 Base) MCG/ACT inhaler Inhale 2 puffs into the lungs every 6 (six) hours as needed for wheezing or shortness of breath. 04/29/17  Yes McQuaid, Ronie Spies, MD  albuterol (PROVENTIL) (2.5 MG/3ML) 0.083% nebulizer solution Take 2.5 mg by nebulization every 6 (six) hours as needed for wheezing or shortness of breath.   Yes [provider]  guaiFENesin (MUCINEX) 600 MG 12 hr tablet Take 1,200 mg by mouth 2 (two) times daily.    Yes [provider]  nicotine polacrilex (NICORETTE) 4 MG gum Take 4 mg by mouth daily as needed for smoking cessation.   Yes [provider]  predniSONE (DELTASONE)  10 MG tablet Take 1 tablet (10 mg total) by mouth daily with breakfast. 06/11/17  Yes Juanito Doom, MD  rivaroxaban (XARELTO) 20 MG TABS tablet Take 1 tablet (20 mg total) by mouth daily with supper. 12/23/16  Yes Magdalen Spatz, NP  sodium chloride (OCEAN) 0.65 % SOLN nasal spray Place 1 spray into both nostrils 4 (four) times daily as needed for congestion.   Yes [provider]  SPIRIVA HANDIHALER 18 MCG inhalation capsule INHALE THE CONTENTS OF 1 CAPSULE VIA INHALATION DEVICE EVERY DAY 02/01/17  Yes Juanito Doom, MD  SYMBICORT 160-4.5 MCG/ACT inhaler TAKE 2 PUFFS FIRST THING IN THE MORNING AND THEN ANOTHER 2 PUFFS ABOUT 12 HOURS LATER 02/01/17  Yes  Juanito Doom, MD  triamterene-hydrochlorothiazide (MAXZIDE-25) 37.5-25 MG tablet TAKE 1 TABLET BY MOUTH DAILY 06/24/17  Yes Golden Circle, FNP  predniSONE (DELTASONE) 10 MG tablet Take 1 tablet (10 mg total) by mouth daily with breakfast. Patient not taking: Reported on 07/14/2017 05/18/17   Juanito Doom, MD   Dg Chest 1 View  Result Date: 07/14/2017 CLINICAL DATA:  Pain following fall.  Cough. EXAM: CHEST 1 VIEW COMPARISON:  December 28, 2016 FINDINGS: There is slight upper lobe scarring bilaterally. There is no edema or consolidation. Heart size and pulmonary vascularity are normal. No adenopathy. No pneumothorax. No acute fracture evident. IMPRESSION: Mild upper lobe scarring.  No edema or consolidation. Electronically Signed   By: Lowella Grip III M.D.   On: 07/14/2017 10:59   Dg Hip Unilat With Pelvis 2-3 Views Left  Result Date: 07/14/2017 CLINICAL DATA:  Fall.  Left hip pain. EXAM: DG HIP (WITH OR WITHOUT PELVIS) 2-3V LEFT COMPARISON:  None. FINDINGS: There is a comminuted subcapital left femoral neck fracture with mild 4 mm lateral displacement of the dominant distal fracture fragment, with mild impaction and with minimal apex anterior/ lateral angulation. No additional fracture. No left hip dislocation. No suspicious focal osseous lesions. No pelvic diastasis. Vascular calcifications throughout the soft tissues. No radiopaque foreign body. IMPRESSION: Comminuted subcapital left femoral neck fracture as detailed. Electronically Signed   By: Ilona Sorrel M.D.   On: 07/14/2017 10:54    All pertinent xrays, MRI, CT independently reviewed and interpreted  Positive ROS: All other systems have been reviewed and were otherwise negative with the exception of those mentioned in the HPI and as above.  Physical Exam: General: Alert, no acute distress Cardiovascular: No pedal edema Respiratory: No cyanosis, no use of accessory musculature GI: No organomegaly, abdomen is soft and  non-tender Skin: No lesions in the area of chief complaint Neurologic: Sensation intact distally Psychiatric: Patient is competent for consent with normal mood and affect Lymphatic: No axillary or cervical lymphadenopathy  MUSCULOSKELETAL:  - pain with movement of the hip and extremity - skin intact - NVI distally - compartments soft  Assessment: Left femoral neck fracture  Plan: - total hip replacement is recommended, patient and family are aware of r/b/a and wish to proceed - consent obtained - medical optimization per primary team - comorbidities are stable - xarelto on hold, may have subq heparin - surgery is planned for Friday due to taking xarelto on Tuesday - NPO after midnight   - Based on history and fracture pattern this likely represents a fragility fracture. - Fragility fractures affect up to one half of women and one third of men after age 23 years and occur in the setting of bone disorder such as osteoporosis or osteopenia  and warrant appropriate work-up. - The following are general recommendations that may serve as an outline for an appropriate work-up:  1.) Obtain bone density measurement to confirm presumptive diagnosis, assess severity of osteoporosis and risk of future fracture, and use as baseline for monitoring treatment  2.) Obtain laboratory tests: CBC, ESR, serum calcium, creatinine, albumin,phosphate, alkaline phosphatase, liver transaminases, protein electrophoresis, urinalysis, 25-hydroxyvitamin D.  3.) Exclude secondary causes of low bone mass and skeletal fragility (eg,multiple myeloma, lymphoma) as indicated.  4.) Obtain radiograph of thoracic and lumbar spine, particularly among individuals with back pain or height loss to assess presence of vertebral fractures  5.) Intermittent administration of recombinant human parathyroid hormone  6.) Optimize nutritional status using nutritional supplementation.  7.) Patient/family education to prevent  future falls.  8.) Early mobilization and exercise program - exercise decreases the rate of bone loss and has been associated with decreased rate of fragility fractures   Thank you for the consult and the opportunity to see Theodore [name]. Eduard Roux, MD [place] 7:24 AM

## 2017-07-15 NOTE — Progress Notes (Signed)
PROGRESS NOTE                                                                                                                                                                                                             Patient Demographics:    Theodore [name], is a 61 y.o. male, DOB - 02-22-1956, AVW:[id number]  Admit date - 07/14/2017   Admitting Physician No admitting provider for patient encounter.  Outpatient Primary MD for the patient is [name], FNP  LOS - 1  Outpatient Specialists:None  Chief Complaint  Patient presents with  . Fall  . Hip Pain       Brief Narrative   61 year old male with history of COPD (recently taken off oxygen by his pulmonologist), CAD, GERD, anxiety, history of PE on Xarelto presented to the ED after sustaining a mechanical fall at home and landed on his left hip. Patient denies loss of consciousness, dizziness, chest pain, headache, shortness of breath, fevers, nausea, vomiting, abdominal pain, bowel or urinary symptoms. In the ED vitals were stable. Blood work showed WBC of 17.7 K, sodium of 133 and potassium of 3.4. X-ray of the hip showed comminuted subcapital left femoral neck fracture. Orthopedics consulted who recommended transfer to [facility] for surgery on 9/7.    Subjective:   Patient seen in the ED, waiting to be transferred to [facility]. Reports left hip pain which is controlled on pain medication.  Assessment  & Plan :    Principal problem  Closed displaced fracture of the left femoral neck (HCC) Secondary to mechanical fall. Patient waiting to be transferred to [facility] for surgical repair tomorrow (orthopedics recommended waiting another day to clear Xarelto from his system) Continue gentle IV hydration. Pain control with Vicodin when necessary. Added bowel regimen.  preop clearance EKG shows PVCs and intraventricular conduction delay. No ST-T changes. 2-D  echo done 3 months back shows EF of 65-70% with no wall motion abnormality. -Patient does not have any chest pain symptoms or shortness of breath at rest. He is able to ablate about 200 feet before getting tired. Has limitations using stairs due to some shortness of breath and arthritis.  -based on [name] perioperative risk for MI and cardiac arrest score,he has 0.6% risk of MI or cardiac arrest periopweratively or upto 30 days post op.  He  is not on perioperative beta blocker and will not be placed on it. Patient is at mild-perioperative cardiac and pulmonary risk for surgery. He does not need any further preoperative cardiac testing.  COPD Was on oxygen until 2 months back and taken off by his pulmonologist has he did not need them. Continue chronic low-dose prednisone and home inhaler. Added when necessary albuterol inhaler. Sats normal on room air.  Essential hypertension Stable. Maxide held for surgery.  Small left elbow laceration Sutured in the ED.  Hypokalemia Replenished in IV fluids.  Mild hyponatremia Continue gentle hydration.  Leukocytosis Suspect stress related and due to chronic prednisone. No signs of infection.   History of pulmonary embolism 2  On chronic Xarelto which is held for surgery.   Code Status : Full code  Family Communication  : None at bedside  Disposition Plan  : Transfer to [facility] for surgery in a.m.  Barriers For Discharge : Active symptoms  Consults  :  [facility] (Dr Roda ShuttersXu)  Procedures  : None  DVT Prophylaxis  : Xarelto (held for surgery tomorrow, resume postop)  Lab Results  Component Value Date   PLT 364 07/14/2017    Antibiotics  :    Anti-infectives    None        Objective:   Vitals:   07/15/17 0030 07/15/17 0220 07/15/17 0414 07/15/17 0730  BP: 133/90 (!) 143/100 133/90 (!) 141/102  Pulse: (!) 105 95 95 (!) 101  Resp: 20 16 18 20   Temp:      TempSrc:      SpO2: 93% 94% 96% 95%    Wt Readings from  Last 3 Encounters:  07/06/17 95.1 kg (209 lb 9.6 oz)  04/29/17 96.8 kg (213 lb 6.4 oz)  03/30/17 96.1 kg (211 lb 13.8 oz)    No intake or output data in the 24 hours ending 07/15/17 0849   Physical Exam  Gen: not in distress HEENT: no pallor, moist mucosa, supple neck Chest: clear b/l, no added sounds CVS: N S1&S2, no murmurs, rubs or gallop GI: soft, NT, ND, BS+ Musculoskeletal: warm, no edema, limited mobility of left hip     Data Review:    CBC  Recent Labs Lab 07/14/17 1101  WBC 17.7*  HGB 15.3  HCT 42.9  PLT 364  MCV 89.7  MCH 32.0  MCHC 35.7  RDW 13.6  LYMPHSABS 1.8  MONOABS 0.6  EOSABS 0.1  BASOSABS 0.0    Chemistries   Recent Labs Lab 07/14/17 1101  NA 133*  K 3.4*  CL 94*  CO2 30  GLUCOSE 99  BUN 12  CREATININE 0.99  CALCIUM 9.0   ------------------------------------------------------------------------------------------------------------------ No results for input(s): CHOL, HDL, LDLCALC, TRIG, CHOLHDL, LDLDIRECT in the last 72 hours.  Lab Results  Component Value Date   HGBA1C 5.8 04/30/2016   ------------------------------------------------------------------------------------------------------------------ No results for input(s): TSH, T4TOTAL, T3FREE, THYROIDAB in the last 72 hours.  Invalid input(s): FREET3 ------------------------------------------------------------------------------------------------------------------ No results for input(s): VITAMINB12, FOLATE, FERRITIN, TIBC, IRON, RETICCTPCT in the last 72 hours.  Coagulation profile No results for input(s): INR, PROTIME in the last 168 hours.  No results for input(s): DDIMER in the last 72 hours.  Cardiac Enzymes  Recent Labs Lab 07/14/17 1101  TROPONINI <0.03   ------------------------------------------------------------------------------------------------------------------ No results found for: BNP  Inpatient Medications  Scheduled Meds: . docusate sodium  100  mg Oral BID  . guaiFENesin  1,200 mg Oral BID  . mometasone-formoterol  2 puff Inhalation BID  .  predniSONE  10 mg Oral Q breakfast  . tiotropium  18 mcg Inhalation Daily   Continuous Infusions: . 0.9 % NaCl with KCl 40 mEq / L 50 mL/hr (07/14/17 1824)   PRN Meds:.albuterol, HYDROcodone-acetaminophen, ipratropium-albuterol, morphine injection, sodium chloride  Micro Results No results found for this or any previous visit (from the past 240 hour(s)).  Radiology Reports Dg Chest 1 View  Result Date: 07/14/2017 CLINICAL DATA:  Pain following fall.  Cough. EXAM: CHEST 1 VIEW COMPARISON:  December 28, 2016 FINDINGS: There is slight upper lobe scarring bilaterally. There is no edema or consolidation. Heart size and pulmonary vascularity are normal. No adenopathy. No pneumothorax. No acute fracture evident. IMPRESSION: Mild upper lobe scarring.  No edema or consolidation. Electronically Signed   By: Bretta Bang III M.D.   On: 07/14/2017 10:59   Dg Hip Unilat With Pelvis 2-3 Views Left  Result Date: 07/14/2017 CLINICAL DATA:  Fall.  Left hip pain. EXAM: DG HIP (WITH OR WITHOUT PELVIS) 2-3V LEFT COMPARISON:  None. FINDINGS: There is a comminuted subcapital left femoral neck fracture with mild 4 mm lateral displacement of the dominant distal fracture fragment, with mild impaction and with minimal apex anterior/ lateral angulation. No additional fracture. No left hip dislocation. No suspicious focal osseous lesions. No pelvic diastasis. Vascular calcifications throughout the soft tissues. No radiopaque foreign body. IMPRESSION: Comminuted subcapital left femoral neck fracture as detailed. Electronically Signed   By: Delbert Phenix M.D.   On: 07/14/2017 10:54    Time Spent in minutes  25   Eddie North M.D on 07/15/2017 at 8:49 AM  Between 7am to 7pm - Pager - [phone]  After 7pm go to [email or web address] - password [facility]  [facility] -  Office  [phone]

## 2017-07-15 NOTE — Progress Notes (Signed)
dulera given. 2puffs

## 2017-07-16 ENCOUNTER — Inpatient Hospital Stay (HOSPITAL_COMMUNITY): Payer: Medicare Other

## 2017-07-16 ENCOUNTER — Inpatient Hospital Stay (HOSPITAL_COMMUNITY): Payer: Medicare Other | Admitting: Certified Registered"

## 2017-07-16 ENCOUNTER — Encounter (HOSPITAL_COMMUNITY): Payer: Self-pay | Admitting: Orthopedic Surgery

## 2017-07-16 ENCOUNTER — Encounter (HOSPITAL_COMMUNITY)
Admission: EM | Disposition: A | Discharge status: Skilled Nursing Facility | Payer: Self-pay | Source: Home / Self Care | Attending: Family Medicine

## 2017-07-16 ENCOUNTER — Inpatient Hospital Stay (HOSPITAL_COMMUNITY): Admission: RE | Admit: 2017-07-16 | Payer: Medicare Other | Source: Ambulatory Visit | Admitting: Orthopaedic Surgery

## 2017-07-16 DIAGNOSIS — S72012A Unspecified intracapsular fracture of left femur, initial encounter for closed fracture: Principal | ICD-10-CM

## 2017-07-16 DIAGNOSIS — J9611 Chronic respiratory failure with hypoxia: Secondary | ICD-10-CM

## 2017-07-16 DIAGNOSIS — M12552 Traumatic arthropathy, left hip: Secondary | ICD-10-CM

## 2017-07-16 DIAGNOSIS — S72002A Fracture of unspecified part of neck of left femur, initial encounter for closed fracture: Secondary | ICD-10-CM

## 2017-07-16 HISTORY — PX: TOTAL HIP ARTHROPLASTY: SHX124

## 2017-07-16 LAB — BASIC METABOLIC PANEL
ANION GAP: 8 (ref 5–15)
BUN: 14 mg/dL (ref 6–20)
CHLORIDE: 101 mmol/L (ref 101–111)
CO2: 23 mmol/L (ref 22–32)
CREATININE: 0.89 mg/dL (ref 0.61–1.24)
Calcium: 8.5 mg/dL — ABNORMAL LOW (ref 8.9–10.3)
GFR calc non Af Amer: >60 mL/min (ref >60)
Glucose, Bld: 98 mg/dL (ref 65–99)
POTASSIUM: 4.4 mmol/L (ref 3.5–5.1)
Sodium: 132 mmol/L — ABNORMAL LOW (ref 135–145)

## 2017-07-16 LAB — CBC
HEMATOCRIT: 39.8 % (ref 39.0–52.0)
HEMOGLOBIN: 13 g/dL (ref 13.0–17.0)
MCH: 30.7 pg (ref 26.0–34.0)
MCHC: 32.7 g/dL (ref 30.0–36.0)
MCV: 93.9 fL (ref 78.0–100.0)
Platelets: 278 10*3/uL (ref 150–400)
RBC: 4.24 MIL/uL (ref 4.22–5.81)
RDW: 14.2 % (ref 11.5–15.5)
WBC: 12.2 10*3/uL — ABNORMAL HIGH (ref 4.0–10.5)

## 2017-07-16 LAB — PROTIME-INR
INR: 1.15
PROTHROMBIN TIME: 14.7 s (ref 11.4–15.2)

## 2017-07-16 SURGERY — ARTHROPLASTY, HIP, TOTAL, ANTERIOR APPROACH
Anesthesia: Spinal | Laterality: Left

## 2017-07-16 MED ORDER — METOCLOPRAMIDE HCL 5 MG/ML IJ SOLN: 5.0000 mg | Freq: Three times a day (TID) | INTRAMUSCULAR | Status: DC | PRN

## 2017-07-16 MED ORDER — PREDNISONE 20 MG PO TABS
40.0000 mg | ORAL_TABLET | Freq: Every day | ORAL | Status: DC
  Administered 2017-07-17 – 2017-07-21 (×5): 40 mg via ORAL
  Filled 2017-07-16 (×6): qty 2

## 2017-07-16 MED ORDER — RIVAROXABAN 20 MG PO TABS: 20.0000 mg | ORAL_TABLET | Freq: Every day | ORAL | 0 refills | Status: DC

## 2017-07-16 MED ORDER — IPRATROPIUM-ALBUTEROL 0.5-2.5 (3) MG/3ML IN SOLN
3.0000 mL | Freq: Three times a day (TID) | RESPIRATORY_TRACT | Status: DC
  Administered 2017-07-16 – 2017-07-21 (×15): 3 mL via RESPIRATORY_TRACT
  Filled 2017-07-16 (×16): qty 3

## 2017-07-16 MED ORDER — CEFAZOLIN SODIUM-DEXTROSE 2-3 GM-% IV SOLR
INTRAVENOUS | Status: DC | PRN
  Administered 2017-07-16: 2 g via INTRAVENOUS

## 2017-07-16 MED ORDER — CEFAZOLIN SODIUM-DEXTROSE 2-4 GM/100ML-% IV SOLN
2.0000 g | Freq: Four times a day (QID) | INTRAVENOUS | Status: AC
  Administered 2017-07-16 – 2017-07-17 (×3): 2 g via INTRAVENOUS
  Filled 2017-07-16 (×3): qty 100

## 2017-07-16 MED ORDER — FENTANYL CITRATE (PF) 100 MCG/2ML IJ SOLN: 25.0000 ug | INTRAMUSCULAR | Status: DC | PRN

## 2017-07-16 MED ORDER — PHENOL 1.4 % MT LIQD: 1.0000 | OROMUCOSAL | Status: DC | PRN

## 2017-07-16 MED ORDER — CEFAZOLIN SODIUM-DEXTROSE 2-4 GM/100ML-% IV SOLN
2.0000 g | INTRAVENOUS | Status: DC
  Filled 2017-07-16: qty 100

## 2017-07-16 MED ORDER — MENTHOL 3 MG MT LOZG: 1.0000 | LOZENGE | OROMUCOSAL | Status: DC | PRN

## 2017-07-16 MED ORDER — ALBUTEROL SULFATE HFA 108 (90 BASE) MCG/ACT IN AERS
INHALATION_SPRAY | RESPIRATORY_TRACT | Status: DC | PRN
  Administered 2017-07-16: 2 via RESPIRATORY_TRACT

## 2017-07-16 MED ORDER — POVIDONE-IODINE 10 % EX SWAB: 2.0000 "application " | Freq: Once | CUTANEOUS | Status: DC

## 2017-07-16 MED ORDER — GLYCOPYRROLATE 0.2 MG/ML IJ SOLN
INTRAMUSCULAR | Status: DC | PRN
  Administered 2017-07-16: 0.2 mg via INTRAVENOUS

## 2017-07-16 MED ORDER — MIDAZOLAM HCL 2 MG/2ML IJ SOLN
INTRAMUSCULAR | Status: DC | PRN
  Administered 2017-07-16 (×2): 1 mg via INTRAVENOUS

## 2017-07-16 MED ORDER — ALUM & MAG HYDROXIDE-SIMETH 200-200-20 MG/5ML PO SUSP
30.0000 mL | ORAL | Status: DC | PRN
  Administered 2017-07-18 – 2017-07-19 (×4): 30 mL via ORAL
  Filled 2017-07-16 (×4): qty 30

## 2017-07-16 MED ORDER — ALBUTEROL SULFATE HFA 108 (90 BASE) MCG/ACT IN AERS
INHALATION_SPRAY | RESPIRATORY_TRACT | Status: AC
  Filled 2017-07-16: qty 6.7

## 2017-07-16 MED ORDER — METHYLPREDNISOLONE SODIUM SUCC 125 MG IJ SOLR
60.0000 mg | Freq: Two times a day (BID) | INTRAMUSCULAR | Status: AC
  Administered 2017-07-16 (×2): 60 mg via INTRAVENOUS
  Filled 2017-07-16 (×2): qty 2

## 2017-07-16 MED ORDER — TRANEXAMIC ACID 1000 MG/10ML IV SOLN
INTRAVENOUS | Status: AC | PRN
  Administered 2017-07-16: 2000 mg via TOPICAL

## 2017-07-16 MED ORDER — SODIUM CHLORIDE 0.9 % IV SOLN
INTRAVENOUS | Status: DC
  Administered 2017-07-16 – 2017-07-17 (×2): via INTRAVENOUS

## 2017-07-16 MED ORDER — ONDANSETRON HCL 4 MG/2ML IJ SOLN
INTRAMUSCULAR | Status: DC | PRN
  Administered 2017-07-16: 4 mg via INTRAVENOUS

## 2017-07-16 MED ORDER — ONDANSETRON HCL 4 MG/2ML IJ SOLN: 4.0000 mg | Freq: Once | INTRAMUSCULAR | Status: DC | PRN

## 2017-07-16 MED ORDER — METHOCARBAMOL 1000 MG/10ML IJ SOLN
500.0000 mg | Freq: Four times a day (QID) | INTRAVENOUS | Status: DC | PRN
  Filled 2017-07-16: qty 5

## 2017-07-16 MED ORDER — FENTANYL CITRATE (PF) 250 MCG/5ML IJ SOLN
INTRAMUSCULAR | Status: AC
  Filled 2017-07-16: qty 5

## 2017-07-16 MED ORDER — OXYCODONE HCL 5 MG PO TABS
5.0000 mg | ORAL_TABLET | ORAL | Status: DC | PRN
  Administered 2017-07-16 – 2017-07-17 (×3): 10 mg via ORAL
  Filled 2017-07-16 (×2): qty 2

## 2017-07-16 MED ORDER — HYDROCODONE-ACETAMINOPHEN 5-325 MG PO TABS: 1.0000 | ORAL_TABLET | Freq: Four times a day (QID) | ORAL | Status: DC | PRN

## 2017-07-16 MED ORDER — FENTANYL CITRATE (PF) 250 MCG/5ML IJ SOLN
INTRAMUSCULAR | Status: DC | PRN
  Administered 2017-07-16: 25 ug via INTRAVENOUS
  Administered 2017-07-16: 50 ug via INTRAVENOUS
  Administered 2017-07-16: 25 ug via INTRAVENOUS

## 2017-07-16 MED ORDER — 0.9 % SODIUM CHLORIDE (POUR BTL) OPTIME
TOPICAL | Status: DC | PRN
  Administered 2017-07-16: 1000 mL

## 2017-07-16 MED ORDER — LACTATED RINGERS IV SOLN
INTRAVENOUS | Status: DC
  Administered 2017-07-16: 16:00:00 via INTRAVENOUS

## 2017-07-16 MED ORDER — MIDAZOLAM HCL 2 MG/2ML IJ SOLN
INTRAMUSCULAR | Status: AC
  Filled 2017-07-16: qty 2

## 2017-07-16 MED ORDER — RIVAROXABAN 20 MG PO TABS
20.0000 mg | ORAL_TABLET | Freq: Every day | ORAL | Status: DC
  Administered 2017-07-17 – 2017-07-21 (×5): 20 mg via ORAL
  Filled 2017-07-16 (×5): qty 1

## 2017-07-16 MED ORDER — PROPOFOL 10 MG/ML IV BOLUS
INTRAVENOUS | Status: DC | PRN
  Administered 2017-07-16 (×4): 20 mg via INTRAVENOUS

## 2017-07-16 MED ORDER — DEXAMETHASONE SODIUM PHOSPHATE 10 MG/ML IJ SOLN
INTRAMUSCULAR | Status: AC
  Filled 2017-07-16: qty 1

## 2017-07-16 MED ORDER — ACETAMINOPHEN 650 MG RE SUPP: 650.0000 mg | Freq: Four times a day (QID) | RECTAL | Status: DC | PRN

## 2017-07-16 MED ORDER — SODIUM CHLORIDE 0.9 % IR SOLN
Status: DC | PRN
  Administered 2017-07-16: 3000 mL

## 2017-07-16 MED ORDER — TRANEXAMIC ACID 1000 MG/10ML IV SOLN
2000.0000 mg | Freq: Once | INTRAVENOUS | Status: DC
  Filled 2017-07-16: qty 20

## 2017-07-16 MED ORDER — DEXAMETHASONE SODIUM PHOSPHATE 10 MG/ML IJ SOLN
INTRAMUSCULAR | Status: DC | PRN
  Administered 2017-07-16: 10 mg via INTRAVENOUS

## 2017-07-16 MED ORDER — BUPIVACAINE IN DEXTROSE 0.75-8.25 % IT SOLN
INTRATHECAL | Status: DC | PRN
  Administered 2017-07-16: 2 mL via INTRATHECAL

## 2017-07-16 MED ORDER — METHOCARBAMOL 500 MG PO TABS
500.0000 mg | ORAL_TABLET | Freq: Four times a day (QID) | ORAL | Status: DC | PRN
  Administered 2017-07-17 – 2017-07-20 (×5): 500 mg via ORAL
  Filled 2017-07-16 (×6): qty 1

## 2017-07-16 MED ORDER — MORPHINE SULFATE (PF) 4 MG/ML IV SOLN: 0.5000 mg | INTRAVENOUS | Status: DC | PRN

## 2017-07-16 MED ORDER — ACETAMINOPHEN 325 MG PO TABS
650.0000 mg | ORAL_TABLET | Freq: Four times a day (QID) | ORAL | Status: DC | PRN
  Administered 2017-07-18 – 2017-07-21 (×8): 650 mg via ORAL
  Filled 2017-07-16 (×9): qty 2

## 2017-07-16 MED ORDER — ONDANSETRON HCL 4 MG/2ML IJ SOLN: 4.0000 mg | Freq: Four times a day (QID) | INTRAMUSCULAR | Status: DC | PRN

## 2017-07-16 MED ORDER — METOCLOPRAMIDE HCL 5 MG PO TABS: 5.0000 mg | ORAL_TABLET | Freq: Three times a day (TID) | ORAL | Status: DC | PRN

## 2017-07-16 MED ORDER — PHENYLEPHRINE HCL 10 MG/ML IJ SOLN
INTRAMUSCULAR | Status: DC | PRN
  Administered 2017-07-16: 25 ug/min via INTRAVENOUS

## 2017-07-16 MED ORDER — PROPOFOL 500 MG/50ML IV EMUL
INTRAVENOUS | Status: DC | PRN
  Administered 2017-07-16: 25 ug/kg/min via INTRAVENOUS

## 2017-07-16 MED ORDER — OXYCODONE-ACETAMINOPHEN 5-325 MG PO TABS: 1.0000 | ORAL_TABLET | ORAL | 0 refills | Status: DC | PRN

## 2017-07-16 MED ORDER — ONDANSETRON HCL 4 MG/2ML IJ SOLN
INTRAMUSCULAR | Status: AC
  Filled 2017-07-16: qty 2

## 2017-07-16 MED ORDER — ONDANSETRON HCL 4 MG PO TABS: 4.0000 mg | ORAL_TABLET | Freq: Four times a day (QID) | ORAL | Status: DC | PRN

## 2017-07-16 SURGICAL SUPPLY — 40 items
BAG DECANTER FOR FLEXI CONT (MISCELLANEOUS) ×3 IMPLANT
CAPT HIP TOTAL 2 ×3 IMPLANT
CELLS DAT CNTRL 66122 CELL SVR (MISCELLANEOUS) ×1 IMPLANT
COVER SURGICAL LIGHT HANDLE (MISCELLANEOUS) ×3 IMPLANT
DRAPE C-ARM 42X72 X-RAY (DRAPES) ×3 IMPLANT
DRAPE STERI IOBAN 125X83 (DRAPES) ×3 IMPLANT
DRAPE U-SHAPE 47X51 STRL (DRAPES) ×6 IMPLANT
DRSG AQUACEL AG ADV 3.5X10 (GAUZE/BANDAGES/DRESSINGS) ×3 IMPLANT
DURAPREP 26ML APPLICATOR (WOUND CARE) ×3 IMPLANT
ELECT BLADE 4.0 EZ CLEAN MEGAD (MISCELLANEOUS) ×3
ELECT REM PT RETURN 9FT ADLT (ELECTROSURGICAL) ×3
ELECTRODE BLDE 4.0 EZ CLN MEGD (MISCELLANEOUS) ×1 IMPLANT
ELECTRODE REM PT RTRN 9FT ADLT (ELECTROSURGICAL) ×1 IMPLANT
GLOVE SKINSENSE NS SZ7.5 (GLOVE) ×2
GLOVE SKINSENSE STRL SZ7.5 (GLOVE) ×1 IMPLANT
GLOVE SURG SYN 7.5  E (GLOVE) ×4
GLOVE SURG SYN 7.5 E (GLOVE) ×2 IMPLANT
GOWN SRG XL XLNG 56XLVL 4 (GOWN DISPOSABLE) ×1 IMPLANT
GOWN STRL NON-REIN XL XLG LVL4 (GOWN DISPOSABLE) ×2
GOWN STRL REUS W/ TWL LRG LVL3 (GOWN DISPOSABLE) ×1 IMPLANT
GOWN STRL REUS W/TWL LRG LVL3 (GOWN DISPOSABLE) ×2
HANDPIECE INTERPULSE COAX TIP (DISPOSABLE) ×2
HOOD PEEL AWAY FLYTE STAYCOOL (MISCELLANEOUS) ×6 IMPLANT
IV NS IRRIG 3000ML ARTHROMATIC (IV SOLUTION) ×3 IMPLANT
KIT BASIN OR (CUSTOM PROCEDURE TRAY) ×3 IMPLANT
MARKER SKIN DUAL TIP RULER LAB (MISCELLANEOUS) ×3 IMPLANT
PACK TOTAL JOINT (CUSTOM PROCEDURE TRAY) ×3 IMPLANT
PACK UNIVERSAL I (CUSTOM PROCEDURE TRAY) ×3 IMPLANT
RTRCTR WOUND ALEXIS 18CM MED (MISCELLANEOUS) ×3
SAW OSC TIP CART 19.5X105X1.3 (SAW) ×3 IMPLANT
SET HNDPC FAN SPRY TIP SCT (DISPOSABLE) ×1 IMPLANT
STAPLER VISISTAT 35W (STAPLE) IMPLANT
SUT ETHIBOND 2 V 37 (SUTURE) ×3 IMPLANT
SUT VIC AB 1 CT1 27 (SUTURE) ×2
SUT VIC AB 1 CT1 27XBRD ANBCTR (SUTURE) ×1 IMPLANT
SUT VIC AB 2-0 CT1 27 (SUTURE) ×2
SUT VIC AB 2-0 CT1 TAPERPNT 27 (SUTURE) ×1 IMPLANT
TOWEL OR 17X26 10 PK STRL BLUE (TOWEL DISPOSABLE) ×3 IMPLANT
TRAY CATH 16FR W/PLASTIC CATH (SET/KITS/TRAYS/PACK) IMPLANT
YANKAUER SUCT BULB TIP NO VENT (SUCTIONS) ×3 IMPLANT

## 2017-07-16 NOTE — Anesthesia Procedure Notes (Signed)
Spinal  Patient location during procedure: OR Start time: 07/16/2017 4:15 PM End time: 07/16/2017 4:25 PM Staffing Anesthesiologist: Karna ChristmasELLENDER, RYAN P Performed: anesthesiologist  Preanesthetic Checklist Completed: patient identified, surgical consent, pre-op evaluation, timeout performed, IV checked, risks and benefits discussed and monitors and equipment checked Spinal Block Patient position: left lateral decubitus Prep: DuraPrep Patient monitoring: cardiac monitor, continuous pulse ox and blood pressure Approach: left paramedian Location: L3-4 Injection technique: single-shot Needle Needle type: Pencan  Needle gauge: 24 G Needle length: 9 cm Assessment Sensory level: T10 Additional Notes Functioning IV was confirmed and monitors were applied. Sterile prep and drape, including hand hygiene and sterile gloves were used. The patient was positioned and the spine was prepped. The skin was anesthetized with lidocaine.  Free flow of clear CSF was obtained prior to injecting local anesthetic into the CSF.  The spinal needle aspirated freely following injection.  The needle was carefully withdrawn.  The patient tolerated the procedure well.

## 2017-07-16 NOTE — H&P (Signed)
H&P update  The surgical history has been reviewed and remains accurate without interval change.  The patient was re-examined and patient's physiologic condition has not changed significantly in the last 30 days. The condition still exists that makes this procedure necessary. The treatment plan remains the same, without new options for care.  No new pharmacological allergies or types of therapy has been initiated that would change the plan or the appropriateness of the plan.  The patient and/or family understand the potential benefits and risks.  Mayra ReelN. Michael Rebacca [place], MD 07/16/2017 7:26 AM

## 2017-07-16 NOTE — Transfer of Care (Signed)
Immediate Anesthesia Transfer of Care Note  Patient: Theodore [name] [facility]  Procedure(s) Performed: Procedure(s): TOTAL HIP ARTHROPLASTY ANTERIOR APPROACH (Left)  Patient Location: PACU  Anesthesia Type:MAC and Spinal  Level of Consciousness: awake, alert  and oriented  Airway & Oxygen Therapy: Patient Spontanous Breathing and Patient connected to nasal cannula oxygen  Post-op Assessment: Report given to RN  Post vital signs: Reviewed and stable  Last Vitals:  Vitals:   07/16/17 1358 07/16/17 1758  BP:    Pulse:  90  Resp:  18  Temp:  36.6 C  SpO2: 95% 96%    Last Pain:  Vitals:   07/16/17 1028  TempSrc:   PainSc: Asleep      Patients Stated Pain Goal: 2 (47/42/59 [id number])  Complications: No apparent anesthesia complications

## 2017-07-16 NOTE — Progress Notes (Signed)
PROGRESS NOTE    Theodore GuestJames W Mawhinney  ZOX:[id number] DOB: October 28, 1956 DOA: 07/14/2017 PCP: Veryl Speakalone, Gregory D, FNP   Brief Narrative: Theodore [name] is a 61 y.o. male with history of COPD (recently taken off oxygen by his pulmonologist), CAD, GERD, anxiety, history of PE on Xarelto presented to the ED after sustaining a mechanical fall at home and landed on his left hip. Patient denies loss of consciousness, dizziness, chest pain, headache, shortness of breath, fevers, nausea, vomiting, abdominal pain, bowel or urinary symptoms. In the ED vitals were stable. Blood work showed WBC of 17.7 K, sodium of 133 and potassium of 3.4. X-ray of the hip showed comminuted subcapital left femoral neck fracture. Orthopedics consulted who recommended transfer [facility] for [facility] on 9/7.   Assessment & Plan:   Active Problems:   COPD exacerbation (HCC)   DM (diabetes mellitus) (HCC)   CAD (coronary artery disease)   Left displaced femoral neck fracture (HCC)   Closed displaced fracture of left femoral neck (HCC)   Chronic respiratory failure with hypoxia (HCC)   Left displaced femoral neck fracture Surgery planned for today -ortho recommendations -continue analgesics -PT/OT/SW consult after surgery  COPD With mild exacerbation. -increase to treatment dose steroids -continue Duonebs -continue guaifenesin  Chronic respiratory failure Secondary to COPD. Weaned off of oxygen recently  History of NSTEMI No chest pain.  Essential hypertension Maxide held on admission  History of PE -resume Xarelto pending ortho recommendations after surgery  History of diabetes mellitus Last A1C of 5.8. Not on therapy -continue dietary modifications    DVT prophylaxis: SCDs Code Status: Full code Family Communication: None at bedside Disposition Plan: Discharge pending orthopedic workup   Consultants:   Orthopedic surgery  Procedures:   None  Antimicrobials:  None     Subjective: Coughing. No chest pain. Continued hip pain.  Objective: Vitals:   07/15/17 1600 07/15/17 1700 07/15/17 2016 07/16/17 0100  BP: (!) 160/140 (!) 155/88 (!) 155/68 132/60  Pulse: 97 (!) 101 (!) 101 (!) 104  Resp: 19 18    Temp:  98.1 F (36.7 C)  97.7 F (36.5 C)  TempSrc:  Oral  Oral  SpO2: 94% 97% 93% 94%    Intake/Output Summary (Last 24 hours) at 07/16/17 0818 Last data filed at 07/15/17 1900  Gross per 24 hour  Intake              220 ml  Output                0 ml  Net              220 ml   There were no vitals filed for this visit.  Examination:  General exam: Appears calm and comfortable  Respiratory system: Prolonged expiratory phase with end expiratory wheezing. Respiratory effort normal. Cardiovascular system: S1 & S2 heard, RRR. No murmurs. Gastrointestinal system: Abdomen is nondistended, soft and nontender. Normal bowel sounds heard. Central nervous system: Alert and oriented. No focal neurological deficits. Extremities: No edema. No calf tenderness Skin: No cyanosis. No rashes Psychiatry: Judgement and insight appear normal. Mood & affect appropriate.     Data Reviewed: I have personally reviewed following labs and imaging studies  CBC:  Recent Labs Lab 07/14/17 1101 07/15/17 0933 07/16/17 0445  WBC 17.7* 15.0* 12.2*  NEUTROABS 15.2*  --   --   HGB 15.3 14.6 13.0  HCT 42.9 43.5 39.8  MCV 89.7 94.2 93.9  PLT 364 322 278  Basic Metabolic Panel:  Recent Labs Lab 07/14/17 1101 07/15/17 0933 07/16/17 0445  NA 133* 130* 132*  K 3.4* 4.5 4.4  CL 94* 96* 101  CO2 GLUCOSE 99 101* 98  BUN CREATININE 0.99 0.88 0.89  CALCIUM 9.0 8.7* 8.5*   GFR: Estimated Creatinine Clearance: 104.3 mL/min (by C-G formula based on SCr of 0.89 mg/dL). Liver Function Tests: No results for input(s): AST, ALT, ALKPHOS, BILITOT, PROT, ALBUMIN in the last 168 hours. No results for input(s): LIPASE, AMYLASE in the last 168  hours. No results for input(s): AMMONIA in the last 168 hours. Coagulation Profile:  Recent Labs Lab 07/15/17 0933  INR 1.21   Cardiac Enzymes:  Recent Labs Lab 07/14/17 1101  TROPONINI <0.03   BNP (last 3 results) No results for input(s): PROBNP in the last 8760 hours. HbA1C: No results for input(s): HGBA1C in the last 72 hours. CBG: No results for input(s): GLUCAP in the last 168 hours. Lipid Profile: No results for input(s): CHOL, HDL, LDLCALC, TRIG, CHOLHDL, LDLDIRECT in the last 72 hours. Thyroid Function Tests: No results for input(s): TSH, T4TOTAL, FREET4, T3FREE, THYROIDAB in the last 72 hours. Anemia Panel: No results for input(s): VITAMINB12, FOLATE, FERRITIN, TIBC, IRON, RETICCTPCT in the last 72 hours. Sepsis Labs: No results for input(s): PROCALCITON, LATICACIDVEN in the last 168 hours.  Recent Results (from the past 240 hour(s))  Surgical PCR screen     Status: None   Collection Time: 07/15/17  6:02 PM  Result Value Ref Range Status   MRSA, PCR NEGATIVE NEGATIVE Final   Staphylococcus aureus NEGATIVE NEGATIVE Final    Comment: (NOTE) The Xpert SA Assay (FDA approved for NASAL specimens in patients 22 years of age and older), is one component of a comprehensive surveillance program. It is not intended to diagnose infection nor to guide or monitor treatment.          Radiology Studies: Dg Chest 1 View  Result Date: 07/14/2017 CLINICAL DATA:  Pain following fall.  Cough. EXAM: CHEST 1 VIEW COMPARISON:  December 28, 2016 FINDINGS: There is slight upper lobe scarring bilaterally. There is no edema or consolidation. Heart size and pulmonary vascularity are normal. No adenopathy. No pneumothorax. No acute fracture evident. IMPRESSION: Mild upper lobe scarring.  No edema or consolidation. Electronically Signed   By: Bretta Bang III M.D.   On: 07/14/2017 10:59   Dg Hip Unilat With Pelvis 2-3 Views Left  Result Date: 07/14/2017 CLINICAL DATA:  Fall.   Left hip pain. EXAM: DG HIP (WITH OR WITHOUT PELVIS) 2-3V LEFT COMPARISON:  None. FINDINGS: There is a comminuted subcapital left femoral neck fracture with mild 4 mm lateral displacement of the dominant distal fracture fragment, with mild impaction and with minimal apex anterior/ lateral angulation. No additional fracture. No left hip dislocation. No suspicious focal osseous lesions. No pelvic diastasis. Vascular calcifications throughout the soft tissues. No radiopaque foreign body. IMPRESSION: Comminuted subcapital left femoral neck fracture as detailed. Electronically Signed   By: Delbert Phenix M.D.   On: 07/14/2017 10:54        Scheduled Meds: . docusate sodium  100 mg Oral BID  . guaiFENesin  1,200 mg Oral BID  . methylPREDNISolone (SOLU-MEDROL) injection  60 mg Intravenous Q12H  . mometasone-formoterol  2 puff Inhalation BID  . povidone-iodine  2 application Topical Once  . [START ON 07/17/2017] predniSONE  40 mg Oral Q breakfast  . tiotropium  18 mcg Inhalation  Daily   Continuous Infusions: . sodium chloride 75 mL/hr at 07/15/17 1740  .  ceFAZolin (ANCEF) IV       LOS: 2 days     Jacquelin Hawking, MD [facility] 07/16/2017, 8:18 AM Pager: [phone]  If 7PM-7AM, please contact night-coverage [email or web address] Password [email or web address] 07/16/2017, 8:18 AM

## 2017-07-16 NOTE — Op Note (Signed)
TOTAL HIP ARTHROPLASTY ANTERIOR APPROACH  Procedure Note Theodore [name]   [id number]  Pre-op Diagnosis: left femoral neck fracture     Post-op Diagnosis: same   Operative Procedures  1. Total hip replacement; Left hip; uncemented [id number]   Personnel  Surgeon(s): Tarry KosXu,  M, MD   Anesthesia: spinal  Prosthesis: Depuy Acetabulum: Pinnacle 56 mm Femur: Corail KHO 14 Head: 36 mm size: +1.5 Liner: +4 Bearing Type: ceramic on poly  Total Hip Arthroplasty (Anterior Approach) Op Note:  After informed consent was obtained and the operative extremity marked in the holding area, the patient was brought back to the operating room and placed supine on the HANA table. Next, the operative extremity was prepped and draped in normal sterile fashion. Surgical timeout occurred verifying patient identification, surgical site, surgical procedure and administration of antibiotics.  A modified anterior Smith-Peterson approach to the hip was performed, using the interval between tensor fascia lata and sartorius.  Dissection was carried bluntly down onto the anterior hip capsule. The lateral femoral circumflex vessels were identified and coagulated. A capsulotomy was performed and the capsular flaps tagged for later repair.  Fluoroscopy was utilized to prepare for the femoral neck cut. The neck osteotomy was performed. The femoral head was removed, the acetabular rim was cleared of soft tissue and attention was turned to reaming the acetabulum.  Sequential reaming was performed under fluoroscopic guidance. We reamed to a size 55 mm, and then impacted the acetabular shell. The liner was then placed after irrigation and attention turned to the femur.  After placing the femoral hook, the leg was taken to externally rotated, extended and adducted position taking care to perform soft tissue releases to allow for adequate mobilization of the femur. Soft tissue was cleared from the shoulder of the greater  trochanter and the hook elevator used to improve exposure of the proximal femur. Sequential broaching performed up to a size 14. Trial neck and head were placed. The leg was brought back up to neutral and the construct reduced. The position and sizing of components, offset and leg lengths were checked using fluoroscopy. Stability of the  construct was checked in extension and external rotation without any subluxation or impingement of prosthesis. We dislocated the prosthesis, dropped the leg back into position, removed trial components, and irrigated copiously. The final stem and head was then placed, the leg brought back up, the system reduced and fluoroscopy used to verify positioning.  We irrigated, obtained hemostasis and closed the capsule using #2 ethibond suture.  Dilute betadyne solution was used. The fascia was closed with #1 vicryl plus, the deep fat layer was closed with 0 vicryl, the subcutaneous layers closed with 2.0 Vicryl Plus and the skin closed with staples. A sterile dressing was applied. The patient was awakened in the operating room and taken to recovery in stable condition.  All sponge, needle, and instrument counts were correct at the end of the case.   Position: supine  Complications: none.  Time Out: performed   Drains/Packing: none  Estimated blood loss: 250 cc  Returned to Recovery Room: in good condition.   Antibiotics: yes   Mechanical VTE (DVT) Prophylaxis: sequential compression devices, TED thigh-high  Chemical VTE (DVT) Prophylaxis: xarelto   Fluid Replacement: see anesthesia record  Specimens Removed: 1 to pathology   Sponge and Instrument Count Correct? yes   PACU: portable radiograph - low AP   Admission: inpatient status  Plan/RTC: Return in 2 weeks for staple removal. Weight Bearing/Load Lower  Extremity: full  Hip precautions: none Suture Removal: 10-14 days  Betadine to incision twice daily once dressing is removed on POD#7  N. Glee Arvin,  MD [facility] [phone] 5:41 PM      Implant Name Type Inv. Item Serial No. Manufacturer Lot No. LRB No. Used  PINNACLE ALTRX PLUS 4 N [id number] - [id number] Hips PINNACLE ALTRX PLUS 4 N [id number]  DEPUY SYNTHES [id number] Left 1  PINN SECTOR W/GRIP ACE CUP 56 - [id number] Hips PINN SECTOR W/GRIP ACE CUP 56  DEPUY SYNTHES [id number] Left 1  CORAIL SZ14 - [id number] Hips CORAIL SZ14  DEPUY SYNTHES [id number] Left 1  HEAD CERAMIC DELTA 36 PLUS 1.5 - [id number] Hips HEAD CERAMIC DELTA 36 PLUS 1.5   DEPUY SYNTHES [id number] Left 1

## 2017-07-16 NOTE — Plan of Care (Signed)
Problem: Pain Managment: Goal: General experience of comfort will improve Patient voices understanding of pain scale and calls for pain medication when needed   

## 2017-07-16 NOTE — Anesthesia Preprocedure Evaluation (Addendum)
Anesthesia Evaluation  Patient identified by MRN, date of birth, ID band Patient awake    Reviewed: Allergy & Precautions, NPO status , Patient's Chart, lab work & pertinent test results  Airway Mallampati: II  TM Distance: >3 FB Neck ROM: Full    Dental no notable dental hx.    Pulmonary asthma , COPD,  COPD inhaler, former smoker, PE Off home oxygen for over one month Sees pulmonologist     Pulmonary exam normal breath sounds clear to auscultation       Cardiovascular hypertension, Pt. on medications + CAD and + Past MI  Normal cardiovascular exam Rhythm:Regular Rate:Normal  ECG: ST, rate 105  ECHO:  Left ventricle: The cavity size was normal. Wall thickness was normal. Systolic function was vigorous. The estimated ejection fraction was in the range of 65% to 70%. Wall motion was normal; there were no regional wall motion abnormalities. Pulmonary arteries: Systolic pressure was mildly increased. PA peak pressure: 37 mm Hg (S).    Neuro/Psych  Headaches, negative psych ROS   GI/Hepatic Neg liver ROS, GERD  Controlled,  Endo/Other    Renal/GU negative Renal ROS     Musculoskeletal negative musculoskeletal ROS (+)   Abdominal   Peds  Hematology negative hematology ROS (+)   Anesthesia Other Findings   Reproductive/Obstetrics                           Anesthesia Physical  Anesthesia Plan  ASA: III  Anesthesia Plan: Spinal   Post-op Pain Management:    Induction: Intravenous  PONV Risk Score and Plan: 2 and Ondansetron, Dexamethasone, Propofol infusion and Treatment may vary due to age or medical condition  Airway Management Planned: Natural Airway  Additional Equipment:   Intra-op Plan:   Post-operative Plan:   Informed Consent: I have reviewed the patients History and Physical, chart, labs and discussed the procedure including the risks, benefits and alternatives for the  proposed anesthesia with the patient or authorized representative who has indicated his/her understanding and acceptance.   Dental advisory given  Plan Discussed with: CRNA  Anesthesia Plan Comments:      Anesthesia Quick Evaluation

## 2017-07-17 ENCOUNTER — Inpatient Hospital Stay (HOSPITAL_COMMUNITY): Payer: Medicare Other

## 2017-07-17 LAB — BASIC METABOLIC PANEL
Anion gap: 8 (ref 5–15)
BUN: 15 mg/dL (ref 6–20)
CHLORIDE: 101 mmol/L (ref 101–111)
CO2: 22 mmol/L (ref 22–32)
CREATININE: 0.98 mg/dL (ref 0.61–1.24)
Calcium: 8.3 mg/dL — ABNORMAL LOW (ref 8.9–10.3)
Glucose, Bld: 148 mg/dL — ABNORMAL HIGH (ref 65–99)
POTASSIUM: 4.4 mmol/L (ref 3.5–5.1)
SODIUM: 131 mmol/L — AB (ref 135–145)

## 2017-07-17 LAB — CBC
HCT: 35.4 % — ABNORMAL LOW (ref 39.0–52.0)
Hemoglobin: 11.5 g/dL — ABNORMAL LOW (ref 13.0–17.0)
MCH: 30.3 pg (ref 26.0–34.0)
MCHC: 32.5 g/dL (ref 30.0–36.0)
MCV: 93.4 fL (ref 78.0–100.0)
PLATELETS: 263 10*3/uL (ref 150–400)
RBC: 3.79 MIL/uL — AB (ref 4.22–5.81)
RDW: 14 % (ref 11.5–15.5)
WBC: 11.9 10*3/uL — AB (ref 4.0–10.5)

## 2017-07-17 MED ORDER — POLYETHYLENE GLYCOL 3350 17 G PO PACK
17.0000 g | PACK | Freq: Every day | ORAL | Status: DC
Start: 2017-07-17 — End: 2017-07-18
  Administered 2017-07-17 – 2017-07-18 (×2): 17 g via ORAL
  Filled 2017-07-17 (×2): qty 1

## 2017-07-17 MED ORDER — AZITHROMYCIN 500 MG PO TABS
500.0000 mg | ORAL_TABLET | Freq: Every day | ORAL | Status: AC
  Administered 2017-07-17: 500 mg via ORAL
  Filled 2017-07-17: qty 1

## 2017-07-17 MED ORDER — AZITHROMYCIN 250 MG PO TABS
250.0000 mg | ORAL_TABLET | Freq: Every day | ORAL | Status: AC
  Administered 2017-07-18 – 2017-07-21 (×4): 250 mg via ORAL
  Filled 2017-07-17 (×4): qty 1

## 2017-07-17 NOTE — Clinical Social Work Placement (Signed)
   CLINICAL SOCIAL WORK PLACEMENT  NOTE  Date:  07/17/2017  Patient Details  Name: Theodore [name] MRN: [id number] Date of Birth: [birth year]  Clinical Social Work is seeking post-discharge placement for this patient at the Skilled  Nursing Facility level of care (*CSW will initial, date and re-position this form in  chart as items are completed):  Yes   Patient/family provided with [facility] Clinical Social Work Department's list of facilities offering this level of care within the geographic area requested by the patient (or if unable, by the patient's family).  Yes   Patient/family informed of their freedom to choose among providers that offer the needed level of care, that participate in Medicare, Medicaid or managed care program needed by the patient, have an available bed and are willing to accept the patient.  Yes   Patient/family informed of [facility]'s ownership interest in [facility] and [facility], as well as of the fact that they are under no obligation to receive care at these facilities.  PASRR submitted to EDS on 07/17/17     PASRR number received on 07/17/17     Existing PASRR number confirmed on       FL2 transmitted to all facilities in geographic area requested by pt/family on 07/17/17     FL2 transmitted to all facilities within larger geographic area on       Patient informed that his/her managed care company has contracts with or will negotiate with certain facilities, including the following:            Patient/family informed of bed offers received.  Patient chooses bed at       Physician recommends and patient chooses bed at      Patient to be transferred to   on  .  Patient to be transferred to facility by       Patient family notified on   of transfer.  Name of family member notified:        PHYSICIAN Please sign FL2     Additional Comment:    _______________________________________________ Theodore [name], Theodore Glendinning N, LCSW 07/17/2017, 3:33  PM

## 2017-07-17 NOTE — Discharge Instructions (Signed)
° ° °  1. Change dressings as needed 2. May shower but keep incisions covered and dry 3. Take xarelto to prevent blood clots 4. Take stool softeners as needed 5. Take pain meds as needed    Information on my medicine - XARELTO (rivaroxaban)  This medication education was reviewed with me or my healthcare representative as part of my discharge preparation.  The pharmacist that spoke with me during my hospital stay was:  Lennon AlstromDang, Kenlynn Houde Dien, [facility]  WHY WAS Carlena HurlXARELTO PRESCRIBED FOR YOU? Xarelto was prescribed to treat blood clots that may have been found in the veins of your legs (deep vein thrombosis) or in your lungs (pulmonary embolism) and to reduce the risk of them occurring again.  What do you need to know about Xarelto? The dose is one 20 mg tablet taken ONCE A DAY with your evening meal.  DO NOT stop taking Xarelto without talking to the health care provider who prescribed the medication.  Refill your prescription for 20 mg tablets before you run out.  After discharge, you should have regular check-up appointments with your healthcare provider that is prescribing your Xarelto.  In the future your dose may need to be changed if your kidney function changes by a significant amount.  What do you do if you miss a dose? If you are taking Xarelto TWICE DAILY and you miss a dose, take it as soon as you remember. You may take two 15 mg tablets (total 30 mg) at the same time then resume your regularly scheduled 15 mg twice daily the next day.  If you are taking Xarelto ONCE DAILY and you miss a dose, take it as soon as you remember on the same day then continue your regularly scheduled once daily regimen the next day. Do not take two doses of Xarelto at the same time.   Important Safety Information Xarelto is a blood thinner medicine that can cause bleeding. You should call your healthcare provider right away if you experience any of the following: ? Bleeding from an injury or your nose  that does not stop. ? Unusual colored urine (red or dark brown) or unusual colored stools (red or black). ? Unusual bruising for unknown reasons. ? A serious fall or if you hit your head (even if there is no bleeding).  Some medicines may interact with Xarelto and might increase your risk of bleeding while on Xarelto. To help avoid this, consult your healthcare provider or pharmacist prior to using any new prescription or non-prescription medications, including herbals, vitamins, non-steroidal anti-inflammatory drugs (NSAIDs) and supplements.  This website has more information on Xarelto: [email or web address].

## 2017-07-17 NOTE — Therapy (Signed)
Occupational Therapy Evaluation Patient Details Name: Theodore [name] Theodore [name] MRN: [id number] DOB: 05-Jul-1956 Today's Date: 07/17/2017    History of Present Illness Theodore [name] Stiff is a 61 y.o. male who presents with left femoral neck fracture s/p mechanical fall PTA.  The pt had L direct anterior THR on 07-16-17.  Prior - without assistive devices (walker, cane, wheelchair).  Does live alone.  Pt does have COPD but was off his O2 prior to fall.   Clinical Impression   Pt reports being independent with ADLs PTA. Currently pt requires min assist for LB ADLs, functional mobility at RW level, and toilet transfers. Set/supervision for all other ADLs. Pt limited by SOB and decreased activity tolerance. Pt SpO2 at 96 on 2 liters prior to standing. After functional mobility SpO2 at 91 on 2 liters and HR 121. Pt would benefit from rehab at SNF to maximize independence with ADLs and functional mobility. OT will follow acutely to address established goals.     Follow Up Recommendations  SNF;Supervision/Assistance - 24 hour    Equipment Recommendations  None recommended by OT    Recommendations for Other Services       Precautions / Restrictions Precautions Precautions: Fall Precaution Comments: no hip precautions Restrictions Weight Bearing Restrictions: Yes LLE Weight Bearing: Weight bearing as tolerated      Mobility Bed Mobility Overal bed mobility: Needs Assistance Bed Mobility: Supine to Sit     Supine to sit: Min assist;HOB elevated     General bed mobility comments: Pt sitting in chair upon arrival  Transfers Overall transfer level: Needs assistance Equipment used: Rolling walker (2 wheeled) Transfers: Sit to/from Stand Sit to Stand: Min assist         General transfer comment: Verbal cuesfor hand placement and technque. Pt reminded to breath during mobilty    Balance Overall balance assessment: Needs assistance Sitting-balance support: No upper extremity supported;Feet  supported Sitting balance-Leahy Scale: Good     Standing balance support: Bilateral upper extremity supported;During functional activity Standing balance-Leahy Scale: Fair Standing balance comment: Relies on RW for stability                           ADL either performed or assessed with clinical judgement   ADL Overall ADL's : Needs assistance/impaired     Grooming: Set up;Sitting   Upper Body Bathing: Supervision/ safety;Set up;Sitting   Lower Body Bathing: Minimal assistance;Sitting/lateral leans   Upper Body Dressing : Supervision/safety;Set up;Sitting   Lower Body Dressing: Minimal assistance;Sitting/lateral leans   Toilet Transfer: Minimal assistance;Ambulation;RW   Toileting- Clothing Manipulation and Hygiene: Supervision/safety;Set up;Sitting/lateral lean   Tub/ Shower Transfer: Minimal assistance;Ambulation;Rolling walker;Shower seat   Functional mobility during ADLs: Minimal assistance;Rolling walker       Vision         Perception     Praxis      Pertinent Vitals/Pain Pain Assessment: No/denies pain Pain Score: 2  (pt pleasantly surprised with pain control) Pain Location: left hip Pain Descriptors / Indicators: Burning;Operative site guarding Pain Intervention(s): Ice applied;Monitored during session     Hand Dominance     Extremity/Trunk Assessment Upper Extremity Assessment Upper Extremity Assessment: Overall WFL for tasks assessed   Lower Extremity Assessment Lower Extremity Assessment: Defer to PT evaluation LLE Deficits / Details:  Cervical / Trunk Assessment Cervical / Trunk Assessment: Normal   Communication Communication Communication: No difficulties   Cognition Arousal/Alertness: Awake/alert Behavior During Therapy: WFL for tasks assessed/performed Overall Cognitive  Status: Within Functional Limits for tasks assessed                                     General Comments  Pt SpO2 at 96 on 2 liters  prior to standing. After functional mobility SpO2 at 91 on 3 liters and HR 121.     Exercises     Shoulder Instructions      Home Living Family/patient expects to be discharged to:: Skilled nursing facility Living Arrangements: Alone                               Additional Comments: pt has mother and sister living near by but not in best health either       Prior Functioning/Environment Level of Independence: Independent        Comments: pt lives alone with 5 step entrance with 2 rails that are shaky.  pt not sure of the equipment he has - will get sister to check - pt had BSC but cant remember if he loaned it out, has walker but cant remember if it has wheels.  Pt has wheelchair if needed.  PLOF - pt was grocery shopping - no devices but limited due to SOB - has to park close and take lots of standing rest breaks.        OT Problem List: Decreased activity tolerance;Cardiopulmonary status limiting activity      OT Treatment/Interventions: Self-care/ADL training;Therapeutic activities;Patient/family education;Balance training;Energy conservation    OT Goals(Current goals can be found in the care plan section) Acute Rehab OT Goals Patient Stated Goal: To be independent OT Goal Formulation: With patient Time For Goal Achievement: 07/31/17 Potential to Achieve Goals: Good ADL Goals Pt Will Transfer to Toilet: with supervision;ambulating;bedside commode Additional ADL Goal #1: Pt will independently use energy conservation strategies during ADLs.   OT Frequency: Min 2X/week   Barriers to D/C:            Co-evaluation              AM-PAC PT "6 Clicks" Daily Activity     Outcome Measure Help from another person eating meals?: None Help from another person taking care of personal grooming?: A Little Help from another person toileting, which includes using toliet, bedpan, or urinal?: A Little Help from another person bathing (including washing, rinsing,  drying)?: A Little Help from another person to put on and taking off regular upper body clothing?: A Little Help from another person to put on and taking off regular lower body clothing?: A Little 6 Click Score: 19   End of Session Equipment Utilized During Treatment: Gait belt;Rolling walker Nurse Communication: Mobility status  Activity Tolerance: Patient tolerated treatment well Patient left: in chair;with call bell/phone within reach  OT Visit Diagnosis: Unsteadiness on feet (R26.81)                Time: 4098-1191 OT Time Calculation (min): 24 min Charges:  OT General Charges $OT Visit: 1 Visit OT Evaluation $OT Eval Moderate Complexity: 1 Mod OT Treatments $Self Care/Home Management : 8-22 mins G-Codes:     Cammy Copa, OTS [phone]   Cammy Copa 07/17/2017, 3:16 PM

## 2017-07-17 NOTE — Evaluation (Signed)
Physical Therapy Evaluation Patient Details Name: Theodore [name] Cavendish MRN: [id number] DOB: 26-Dec-1955 Today's Date: 07/17/2017   History of Present Illness  Theodore [name] Cohron is a 61 y.o. male who presents with left femoral neck fracture s/p mechanical fall PTA.  The pt had L direct anterior THR on 07-16-17.  Prior - without assistive devices (walker, cane, wheelchair).  Does live alone.  Pt does have COPD but was off his O2 prior to fall.  Clinical Impression  Pt limited by his SOB today.  Pt didn't use O2 prior to surgery.  Pt walked 8 feet today on room air and O2 sats to 80% and recovered with O2 in 4 minutes.  Pt educated on safety.  Pt very cooperative with PT eval.  Anticipate that pt is going to have slow recovery due to respiratory status.  He will be using O2 during further treatments. Pt will benefit from intensive PT to return to functional status.  Pt has 2/10 hip pain - very happy with hip and pain control.    Follow Up Recommendations DC plan and follow up therapy as arranged by surgeon;Supervision for mobility/OOB    Equipment Recommendations   (pt to check wtih sister to see what equipment he has available)    Recommendations for Other Services       Precautions / Restrictions Precautions Precautions: Fall Precaution Comments: no hip precautions Restrictions Weight Bearing Restrictions: Yes LLE Weight Bearing: Weight bearing as tolerated      Mobility  Bed Mobility Overal bed mobility: Needs Assistance Bed Mobility: Supine to Sit     Supine to sit: Min assist;HOB elevated     General bed mobility comments: pt used rail to help get into sitting.  Pt needed min assist for left leg.  Pt reminded to breath with mobility  Transfers Overall transfer level: Needs assistance Equipment used: Rolling walker (2 wheeled) Transfers: Sit to/from Stand Sit to Stand: Min assist;From elevated surface         General transfer comment: pt needed cueing for hand placement and  technque.  pt reminded to breath during mobilty  Ambulation/Gait Ambulation/Gait assistance: Min assist Ambulation Distance (Feet): 8 Feet Assistive device: Rolling walker (2 wheeled) (chair following pt) Gait Pattern/deviations: Step-to pattern;Trunk flexed;Shuffle;Decreased stance time - left     General Gait Details: pt needed constant cues for sequencing and breathing.  pt encouraged to move slowly and not talk while walking.  Chair pulled up behind pt.  Pt at rest 92% on 2L O2.   pt walked 8 feet on RA and O2 sats dropped to 80% and took 4 min to recover.  Nursing aware.  pt wtih wet cough, congestion and wheezing - worse than his normal.  encouraged pt to try to cough up congestion  Stairs            Wheelchair Mobility    Modified Rankin (Stroke Patients Only)       Balance Overall balance assessment: Needs assistance             Standing balance comment: pt relies on RW for standign at this time                             Pertinent Vitals/Pain Pain Assessment: 0-10 Pain Score: 2  (pt pleasantly surprised with pain control) Pain Location: left hip Pain Descriptors / Indicators: Burning;Operative site guarding Pain Intervention(s): Monitored during session;Repositioned    Home Living Family/patient expects  to be discharged to:: Skilled nursing facility Living Arrangements: Alone               Additional Comments: pt has mother and sister living near by but not in best health either     Prior Function Level of Independence: Independent         Comments: pt lives alone with 5 step entrance with 2 rails that are shaky.  pt not sure of the equipment he has - will get sister to check - pt had BSC but cant remember if he loaned it out, has walker but cant remember if it has wheels.  Pt has wheelchair if needed.  PLOF - pt was grocery shopping - no devices but limited due to SOB - has to park close and take lots of standing rest breaks.      Hand Dominance        Extremity/Trunk Assessment   Upper Extremity Assessment Upper Extremity Assessment: Defer to OT evaluation    Lower Extremity Assessment Lower Extremity Assessment: LLE deficits/detail LLE Deficits / Details: limited due to recent surgery - no obvious deficits.  right leg WFL    Cervical / Trunk Assessment Cervical / Trunk Assessment: Normal  Communication   Communication: No difficulties  Cognition Arousal/Alertness: Awake/alert Behavior During Therapy: WFL for tasks assessed/performed Overall Cognitive Status: Within Functional Limits for tasks assessed                                        General Comments      Exercises     Assessment/Plan    PT Assessment Patient needs continued PT services  PT Problem List Decreased strength;Decreased mobility;Decreased range of motion;Decreased activity tolerance;Decreased balance;Decreased knowledge of use of DME;Decreased safety awareness;Cardiopulmonary status limiting activity;Pain       PT Treatment Interventions DME instruction;Gait training;Functional mobility training;Therapeutic activities;Therapeutic exercise;Balance training;Patient/family education    PT Goals (Current goals can be found in the Care Plan section)  Acute Rehab PT Goals Patient Stated Goal: to get back home and be abel to be on his own PT Goal Formulation: With patient Time For Goal Achievement: 07/24/17 Potential to Achieve Goals: Good    Frequency 7X/week   Barriers to discharge Decreased caregiver support pt has sister and mother near by but said neither are in great health themselves    Co-evaluation               AM-PAC PT "6 Clicks" Daily Activity  Outcome Measure Difficulty turning over in bed (including adjusting bedclothes, sheets and blankets)?: A Little Difficulty moving from lying on back to sitting on the side of the bed? : A Little Difficulty sitting down on and standing up  from a chair with arms (e.g., wheelchair, bedside commode, etc,.)?: A Little Help needed moving to and from a bed to chair (including a wheelchair)?: A Little Help needed walking in hospital room?: A Lot Help needed climbing 3-5 steps with a railing? : Total 6 Click Score: 15    End of Session Equipment Utilized During Treatment: Gait belt (needs O2 at all times next visit) Activity Tolerance: Treatment limited secondary to medical complications (Comment) Patient left: in chair;with call bell/phone within reach Nurse Communication: Mobility status PT Visit Diagnosis: Other abnormalities of gait and mobility (R26.89);Pain Pain - Right/Left: Left Pain - part of body: Hip    Time: 1150-1230 PT Time Calculation (min) (  ACUTE ONLY): 40 min   Charges:   PT Evaluation $PT Eval Moderate Complexity: 1 Mod PT Treatments $Gait Training: 8-22 mins   PT G Codes:       08-10-17   Ranae Palms, PT   Judson Roch Aug 10, 2017, 12:55 PM

## 2017-07-17 NOTE — Clinical Social Work Note (Signed)
Clinical Social Work Assessment  Patient Details  Name: Theodore [name] Hout MRN: [id number] Date of Birth: 07/26/1956  Date of referral:  07/17/17               Reason for consult:  Facility Placement, Discharge Planning                Permission sought to share information with:  Case Manager, Magazine features editoracility Contact Representative Permission granted to share information::  Yes, Verbal Permission Granted  Name::        Agency::  SNF bed search  Relationship::     Contact Information:     Housing/Transportation Living arrangements for the past 2 months:  Single Family Home Source of Information:  Patient, Medical Team, Case Manager Patient Interpreter Needed:  None Criminal Activity/Legal Involvement Pertinent to Current Situation/Hospitalization:  No - Comment as needed Significant Relationships:  Other Family Members, Siblings, Parents Lives with:  Self Do you feel safe going back to the place where you live?  No Need for family participation in patient care:  No (Coment)  Care giving concerns:   Theodore [name] Casavant is a 61 y.o. male with medical history significant of COPD, recently taken off off oxygen by his pulmonologist, coronary artery disease, GERD and anxiety, pulmonary embolism on xarelto, presented after a fall sustaining left hip pain. Patient reported that he was feeling good until he tripped on the floor fan and fell on the ground. He fell on the left side and caused left hip pain. He reported that it was accidental.  Patient reports he lives alone but has strong family support with sisters. Patient reports he is having pain however he is progressing slowly however he is realistic and willing to complete therapy at SNF.  Patient has no preference with SNF bed, thus will complete referrals and follow up with bed offers.   Social Worker assessment / plan:  Consult completed for SNF.  Assessment and SNF work up in progress. LCSW will follow up with bed offers and continue to assist  with placement.  Plan: SNF  Employment status:  Disabled (Comment on whether or not currently receiving Disability) (since 2015 for breathing issues) Insurance information:  Medicare PT Recommendations:  Skilled Nursing Facility Information / Referral to community resources:  Skilled Nursing Facility  Patient/Family's Response to care:  Agreeable to treatment plan  Patient/Family's Understanding of and Emotional Response to Diagnosis, Current Treatment, and Prognosis:  Patient reports he is doing well overall and in positive spirits as he progresses each day. He process his pain levels and motivation to get stronger and return home  Emotional Assessment Appearance:    Attitude/Demeanor/Rapport:    Affect (typically observed):  Accepting, Adaptable, Pleasant Orientation:  Oriented to Self, Oriented to Place, Oriented to  Time, Oriented to Situation Alcohol / Substance use:  Not Applicable Psych involvement (Current and /or in the community):  No (Comment)  Discharge Needs  Concerns to be addressed:  No discharge needs identified Readmission within the last 30 days:  No Current discharge risk:  None Barriers to Discharge:  No Barriers Identified   Raye SorrowCoble, Lavoris Sparling N, LCSW 07/17/2017, 3:34 PM

## 2017-07-17 NOTE — Progress Notes (Signed)
Subjective: Patient stable.  Hip is feeling better   Objective: Vital signs in last 24 hours: Temp:  [97.7 F (36.5 C)-98.1 F (36.7 C)] 97.7 F (36.5 C) (09/08 0500) Pulse Rate:  [89-99] 96 (09/08 0500) Resp:  [17-20] 20 (09/07 2248) BP: (108-134)/(71-82) 134/76 (09/08 0500) SpO2:  [94 %-100 %] 100 % (09/08 0735) Weight:  [209 lb 9.6 oz (95.1 kg)] 209 lb 9.6 oz (95.1 kg) (09/07 1537)  Intake/Output from previous day: 09/07 0701 - 09/08 0700 In: 50 [I.V.:50] Out: 1100 [Urine:800; Blood:300] Intake/Output this shift: No intake/output data recorded.  Exam:  Dorsiflexion/Plantar flexion intact  Labs:  Recent Labs  07/14/17 1101 07/15/17 0933 07/16/17 0445 07/17/17 0353  HGB 15.3 14.6 13.0 11.5*    Recent Labs  07/16/17 0445 07/17/17 0353  WBC 12.2* 11.9*  RBC 4.24 3.79*  HCT 39.8 35.4*  PLT 278 263    Recent Labs  07/16/17 0445 07/17/17 0353  NA 132* 131*  K 4.4 4.4  CL 101 101  CO2 23 22  BUN 14 15  CREATININE 0.89 0.98  GLUCOSE 98 148*  CALCIUM 8.5* 8.3*    Recent Labs  07/15/17 0933 07/16/17 1512  INR 1.21 1.15    Assessment/Plan: Plan to continue mobilization with physical therapy.  Placement is pending   Burnard BuntingG Scott Venus Ruhe 07/17/2017, 8:55 AM

## 2017-07-17 NOTE — Progress Notes (Addendum)
PROGRESS NOTE    Theodore GuestJames W Dayton  ZOX:[id number] DOB: [birth year] DOA: 07/14/2017 PCP: Veryl Speakalone, Gregory D, FNP   Brief Narrative: Theodore [name] is a 61 y.o. male with history of COPD (recently taken off oxygen by his pulmonologist), CAD, GERD, anxiety, history of PE on Xarelto presented to the ED after sustaining a mechanical fall at home and landed on his left hip. Patient denies loss of consciousness, dizziness, chest pain, headache, shortness of breath, fevers, nausea, vomiting, abdominal pain, bowel or urinary symptoms. In the ED vitals were stable. Blood work showed WBC of 17.7 K, sodium of 133 and potassium of 3.4. X-ray of the hip showed comminuted subcapital left femoral neck fracture. Orthopedics consulted who recommended transfer to [facility] for [facility] on 9/7.   Assessment & Plan:   Active Problems:   COPD exacerbation (HCC)   DM (diabetes mellitus) (HCC)   CAD (coronary artery disease)   Left displaced femoral neck fracture (HCC)   Closed displaced fracture of left femoral neck (HCC)   Chronic respiratory failure with hypoxia (HCC)   Left displaced femoral neck fracture Surgery planned for today -ortho recommendations -continue analgesics -PT/OT/SW consult after surgery  COPD With exacerbation. Patient on chronic steroids -continue prednisone 40mg  -continue Duonebs -continue guaifenesin -azithromycin -CXR  Chronic respiratory failure Secondary to COPD. Weaned off of oxygen recently -keep O2 >88%  History of NSTEMI No chest pain.  Essential hypertension Maxide held on admission. Normotensive.  History of PE -resume Xarelto pending ortho recommendations after surgery  History of diabetes mellitus Last A1C of 5.8. Not on therapy -continue dietary modifications    DVT prophylaxis: SCDs Code Status: Full code Family Communication: None at bedside Disposition Plan: Discharge pending orthopedic workup and improvement of COPD  exacerbation   Consultants:   Orthopedic surgery  Procedures:   None  Antimicrobials:  None    Subjective: Cough, sputum.  Objective: Vitals:   07/17/17 0500 07/17/17 0735 07/17/17 1303 07/17/17 1430  BP: 134/76   135/76  Pulse: 96   88  Resp:    18  Temp: 97.7 F (36.5 C)   98 F (36.7 C)  TempSrc: Oral   Oral  SpO2: 94% 100% 97% 97%  Weight:      Height:        Intake/Output Summary (Last 24 hours) at 07/17/17 1432 Last data filed at 07/17/17 1430  Gross per 24 hour  Intake              770 ml  Output             1900 ml  Net            -1130 ml   Filed Weights   07/16/17 1537  Weight: 95.1 kg (209 lb 9.6 oz)    Examination:  General exam: Appears calm and comfortable  Respiratory system: Prolonged expiratory phase with end expiratory wheezing. Frequent pauses for breaths. Cardiovascular system: S1 & S2 heard, RRR. No murmurs. Gastrointestinal system: Abdomen is nondistended, soft and nontender. Normal bowel sounds heard. Central nervous system: Alert and oriented. No focal neurological deficits. Extremities: No edema. No calf tenderness Skin: No cyanosis. No rashes Psychiatry: Judgement and insight appear normal. Mood & affect appropriate.     Data Reviewed: I have personally reviewed following labs and imaging studies  CBC:  Recent Labs Lab 07/14/17 1101 07/15/17 0933 07/16/17 0445 07/17/17 0353  WBC 17.7* 15.0* 12.2* 11.9*  NEUTROABS 15.2*  --   --   --  HGB 15.3 14.6 13.0 11.5*  HCT 42.9 43.5 39.8 35.4*  MCV 89.7 94.2 93.9 93.4  PLT 364 322 278 263   Basic Metabolic Panel:  Recent Labs Lab 07/14/17 1101 07/15/17 0933 07/16/17 0445 07/17/17 0353  NA 133* 130* 132* 131*  K 3.4* 4.5 4.4 4.4  CL 94* 96* 101 101  CO2 GLUCOSE 99 101* 98 148*  BUN CREATININE 0.99 0.88 0.89 0.98  CALCIUM 9.0 8.7* 8.5* 8.3*   GFR: Estimated Creatinine Clearance: 94.7 mL/min (by C-G formula based on SCr of 0.98  mg/dL). Liver Function Tests: No results for input(s): AST, ALT, ALKPHOS, BILITOT, PROT, ALBUMIN in the last 168 hours. No results for input(s): LIPASE, AMYLASE in the last 168 hours. No results for input(s): AMMONIA in the last 168 hours. Coagulation Profile:  Recent Labs Lab 07/15/17 0933 07/16/17 1512  INR 1.21 1.15   Cardiac Enzymes:  Recent Labs Lab 07/14/17 1101  TROPONINI <0.03   BNP (last 3 results) No results for input(s): PROBNP in the last 8760 hours. HbA1C: No results for input(s): HGBA1C in the last 72 hours. CBG: No results for input(s): GLUCAP in the last 168 hours. Lipid Profile: No results for input(s): CHOL, HDL, LDLCALC, TRIG, CHOLHDL, LDLDIRECT in the last 72 hours. Thyroid Function Tests: No results for input(s): TSH, T4TOTAL, FREET4, T3FREE, THYROIDAB in the last 72 hours. Anemia Panel: No results for input(s): VITAMINB12, FOLATE, FERRITIN, TIBC, IRON, RETICCTPCT in the last 72 hours. Sepsis Labs: No results for input(s): PROCALCITON, LATICACIDVEN in the last 168 hours.  Recent Results (from the past 240 hour(s))  Surgical PCR screen     Status: None   Collection Time: 07/15/17  6:02 PM  Result Value Ref Range Status   MRSA, PCR NEGATIVE NEGATIVE Final   Staphylococcus aureus NEGATIVE NEGATIVE Final    Comment: (NOTE) The Xpert SA Assay (FDA approved for NASAL specimens in patients 13 years of age and older), is one component of a comprehensive surveillance program. It is not intended to diagnose infection nor to guide or monitor treatment.          Radiology Studies: Pelvis Portable  Result Date: 07/16/2017 CLINICAL DATA:  Postop left hip arthroplasty EXAM: PORTABLE PELVIS 1-2 VIEWS COMPARISON:  07/14/2017 FINDINGS: Interval left hip replacement with normal alignment. Pubic symphysis is intact. Vascular calcifications. Moderate degenerative changes of the right hip. Cutaneous staples left hip. Moderate soft tissue gas. IMPRESSION: Interval  left hip replacement with normal alignment. Expected postsurgical changes. Electronically Signed   By: Jasmine Pang M.D.   On: 07/16/2017 19:53   Dg Chest Port 1 View  Result Date: 07/17/2017 CLINICAL DATA:  61 year old male with decreased breath sounds EXAM: PORTABLE CHEST 1 VIEW COMPARISON:  Prior chest x-ray 07/14/2017 FINDINGS: The lungs are clear and negative for focal airspace consolidation, pulmonary edema or suspicious pulmonary nodule. No pleural effusion or pneumothorax. Cardiac and mediastinal contours are within normal limits. No acute fracture or lytic or blastic osseous lesions. The visualized upper abdominal bowel gas pattern is unremarkable. IMPRESSION: Negative chest x-ray. Electronically Signed   By: Malachy Moan M.D.   On: 07/17/2017 10:48   Dg C-arm 1-60 Min  Result Date: 07/16/2017 CLINICAL DATA:  Left total hip arthroplasty. EXAM: OPERATIVE LEFT HIP WITH PELVIS; DG C-ARM 61-120 MIN COMPARISON:  Left hip radiographs 07/14/2017 FLUOROSCOPY TIME:  C-arm fluoroscopic images were obtained intraoperatively and submitted for post operative interpretation. Please see the performing provider's  procedural report for the fluoroscopy time utilized. FINDINGS: Two intraoperative spot fluoroscopic images are provided. A left total hip arthroplasty has been performed. The prosthetic components appear normally located on these limited images. IMPRESSION: Intraoperative images during left hip arthroplasty. Electronically Signed   By: Sebastian Ache M.D.   On: 07/16/2017 18:05   Dg Hip Operative Unilat W Or W/o Pelvis Left  Result Date: 07/16/2017 CLINICAL DATA:  Left total hip arthroplasty. EXAM: OPERATIVE LEFT HIP WITH PELVIS; DG C-ARM 61-120 MIN COMPARISON:  Left hip radiographs 07/14/2017 FLUOROSCOPY TIME:  C-arm fluoroscopic images were obtained intraoperatively and submitted for post operative interpretation. Please see the performing provider's procedural report for the fluoroscopy time  utilized. FINDINGS: Two intraoperative spot fluoroscopic images are provided. A left total hip arthroplasty has been performed. The prosthetic components appear normally located on these limited images. IMPRESSION: Intraoperative images during left hip arthroplasty. Electronically Signed   By: Sebastian Ache M.D.   On: 07/16/2017 18:05        Scheduled Meds: . [START ON 07/18/2017] azithromycin  250 mg Oral Daily  . docusate sodium  100 mg Oral BID  . guaiFENesin  1,200 mg Oral BID  . ipratropium-albuterol  3 mL Nebulization TID  . mometasone-formoterol  2 puff Inhalation BID  . polyethylene glycol  17 g Oral Daily  . predniSONE  40 mg Oral Q breakfast  . rivaroxaban  20 mg Oral Q supper   Continuous Infusions: . sodium chloride 125 mL/hr at 07/17/17 0631  . lactated ringers 10 mL/hr at 07/16/17 1540  . methocarbamol (ROBAXIN)  IV       LOS: 3 days     Jacquelin Hawking, MD [facility] 07/17/2017, 2:32 PM Pager: [phone]  If 7PM-7AM, please contact night-coverage [email or web address] Password [email or web address] 07/17/2017, 2:32 PM

## 2017-07-17 NOTE — NC FL2 (Signed)
[place] MEDICAID FL2 LEVEL OF CARE SCREENING TOOL     IDENTIFICATION  Patient Name: Theodore [name] Birthdate: [birth year] Sex: male Admission Date (Current Location): 07/14/2017  [facility] [place]   Facility and Address:  [facility], [address], [place], [place] [address]      Provider Number: [id number]  Attending Physician Name and Address:  Narda BondsNettey, Ralph A, MD  Relative Name and Phone Number:       Current Level of Care: Hospital Recommended Level of Care: Skilled Nursing Facility Prior Approval Number:    Date Approved/Denied:   PASRR Number:   [id number]  Discharge Plan: SNF    Current Diagnoses: Patient Active Problem List   Diagnosis Date Noted  . Chronic respiratory failure with hypoxia (HCC) 07/16/2017  . Left displaced femoral neck fracture (HCC) 07/14/2017  . Closed displaced fracture of left femoral neck (HCC) 07/14/2017  . Hypokalemia   . Cardiac arrhythmia 07/06/2017  . Costochondral chest pain 12/28/2016  . Bilateral lower extremity edema 12/28/2016  . Chronic anticoagulation 12/23/2016  . DOE (dyspnea on exertion) 12/03/2016  . Chest pain 12/03/2016  . Diabetes mellitus with complication (HCC)   . Pulmonary embolism on right (HCC)   . Anxiety state 10/30/2015  . Routine general medical examination at a health care facility 09/03/2014  . Back pain 09/03/2014  . Nausea without vomiting 08/22/2014  . HBP (high blood pressure) 01/29/2014  . Acute pulmonary embolism (HCC) 01/23/2014  . Pulmonary embolism (HCC) 01/23/2014  . CAD (coronary artery disease) 01/23/2014  . Acute respiratory failure (HCC) 01/23/2014  . HCAP (healthcare-associated pneumonia) 01/20/2014  . Chronic respiratory failure assoc with cor pulmonale 01/20/2014  . Acute cor pulmonale (HCC) 01/12/2014  . NSTEMI (non-ST elevated myocardial infarction) (HCC) 01/11/2014  . DM (diabetes mellitus) (HCC) 01/10/2014  . Multiple lung  nodules 10/24/2013  . PNA (pneumonia) 06/25/2013  . COPD exacerbation (HCC) 06/13/2013  . Smoking 03/03/2012  . COPD GOLD IV  09/28/2011    Orientation RESPIRATION BLADDER Height & Weight     Self, Time, Situation, Place  O2 (2L) Continent Weight: 209 lb 9.6 oz (95.1 kg) Height:  6\' 4"  (193 cm)  BEHAVIORAL SYMPTOMS/MOOD NEUROLOGICAL BOWEL NUTRITION STATUS      Continent Diet (regular)  AMBULATORY STATUS COMMUNICATION OF NEEDS Skin   Extensive Assist Verbally Surgical wounds, Skin abrasions, Bruising                       Personal Care Assistance Level of Assistance  Bathing, Feeding, Dressing Bathing Assistance: Maximum assistance Feeding assistance: Independent Dressing Assistance: Maximum assistance     Functional Limitations Info  Sight, Hearing, Speech Sight Info: Adequate Hearing Info: Adequate Speech Info: Adequate    SPECIAL CARE FACTORS FREQUENCY  PT (By licensed PT), OT (By licensed OT)     PT Frequency: 5x a week OT Frequency: 5x a week            Contractures Contractures Info: Not present    Additional Factors Info  Code Status, Allergies Code Status Info: Full Code Allergies Info: Naproxen Sodium           Current Medications (07/17/2017):  This is the current hospital active medication list Current Facility-Administered Medications  Medication Dose Route Frequency Provider Last Rate Last Dose  . acetaminophen (TYLENOL) tablet 650 mg  650 mg Oral Q6H PRN Tarry KosXu, Naiping M, MD       Or  .  acetaminophen (TYLENOL) suppository 650 mg  650 mg Rectal Q6H PRN Tarry Kos, MD      . albuterol (PROVENTIL) (2.5 MG/3ML) 0.083% nebulizer solution 2.5 mg  2.5 mg Nebulization Q4H PRN Opyd, Lavone Neri, MD   2.5 mg at 07/15/17 1417  . alum & mag hydroxide-simeth (MAALOX/MYLANTA) 200-200-20 MG/5ML suspension 30 mL  30 mL Oral Q4H PRN Tarry Kos, MD      . Melene Muller ON 07/18/2017] azithromycin (ZITHROMAX) tablet 250 mg  250 mg Oral Daily Narda Bonds, MD       . docusate sodium (COLACE) capsule 100 mg  100 mg Oral BID Maxie Barb, MD   100 mg at 07/17/17 0800  . guaiFENesin (MUCINEX) 12 hr tablet 1,200 mg  1,200 mg Oral BID Maxie Barb, MD   1,200 mg at 07/17/17 0802  . HYDROcodone-acetaminophen (NORCO/VICODIN) 5-325 MG per tablet 1-2 tablet  1-2 tablet Oral Q6H PRN Maxie Barb, MD   2 tablet at 07/17/17 0954  . ipratropium-albuterol (DUONEB) 0.5-2.5 (3) MG/3ML nebulizer solution 3 mL  3 mL Nebulization Q4H PRN Maxie Barb, MD   3 mL at 07/17/17 0543  . ipratropium-albuterol (DUONEB) 0.5-2.5 (3) MG/3ML nebulizer solution 3 mL  3 mL Nebulization TID Narda Bonds, MD   3 mL at 07/17/17 1301  . lactated ringers infusion   Intravenous Continuous Ellender, Catheryn Bacon, MD 10 mL/hr at 07/16/17 1540    . menthol-cetylpyridinium (CEPACOL) lozenge 3 mg  1 lozenge Oral PRN Tarry Kos, MD       Or  . phenol (CHLORASEPTIC) mouth spray 1 spray  1 spray Mouth/Throat PRN Tarry Kos, MD      . methocarbamol (ROBAXIN) tablet 500 mg  500 mg Oral Q6H PRN Tarry Kos, MD   500 mg at 07/17/17 0543   Or  . methocarbamol (ROBAXIN) 500 mg in dextrose 5 % 50 mL IVPB  500 mg Intravenous Q6H PRN Tarry Kos, MD      . metoCLOPramide (REGLAN) tablet 5-10 mg  5-10 mg Oral Q8H PRN Tarry Kos, MD       Or  . metoCLOPramide (REGLAN) injection 5-10 mg  5-10 mg Intravenous Q8H PRN Tarry Kos, MD      . mometasone-formoterol [facility]) 200-5 MCG/ACT inhaler 2 puff  2 puff Inhalation BID Maxie Barb, MD   2 puff at 07/17/17 [phone]  . morphine 4 MG/ML injection 0.52 mg  0.52 mg Intravenous Q2H PRN Tarry Kos, MD      . ondansetron [facility]) tablet 4 mg  4 mg Oral Q6H PRN Tarry Kos, MD       Or  . ondansetron [facility]) injection 4 mg  4 mg Intravenous Q6H PRN Tarry Kos, MD      . oxyCODONE (Oxy IR/ROXICODONE) immediate release tablet 5-10 mg  5-10 mg Oral Q4H PRN Tarry Kos, MD   10 mg at 07/17/17 0543  .  polyethylene glycol (MIRALAX / GLYCOLAX) packet 17 g  17 g Oral Daily Narda Bonds, MD   17 g at 07/17/17 1139  . predniSONE (DELTASONE) tablet 40 mg  40 mg Oral Q breakfast Narda Bonds, MD   40 mg at 07/17/17 0800  . rivaroxaban (XARELTO) tablet 20 mg  20 mg Oral Q supper Tarry Kos, MD      . sodium chloride (OCEAN) 0.65 % nasal spray 1 spray  1 spray Each Nare QID PRN  Maxie Barb, MD         Discharge Medications: Please see discharge summary for a list of discharge medications.  Relevant Imaging Results:  Relevant Lab Results:   Additional Information SSN:  [id number]  Raye Sorrow, [place]

## 2017-07-17 NOTE — Anesthesia Postprocedure Evaluation (Signed)
Anesthesia Post Note  Patient: Len BlalockJames W Girdler  Procedure(s) Performed: Procedure(s) (LRB): TOTAL HIP ARTHROPLASTY ANTERIOR APPROACH (Left)     Patient location during evaluation: PACU Anesthesia Type: Spinal Level of consciousness: awake and alert Pain management: pain level controlled Vital Signs Assessment: post-procedure vital signs reviewed and stable Respiratory status: spontaneous breathing, nonlabored ventilation, respiratory function stable and patient connected to nasal cannula oxygen Cardiovascular status: blood pressure returned to baseline and stable Postop Assessment: no signs of nausea or vomiting and spinal receding Anesthetic complications: no    Last Vitals:  Vitals:   07/17/17 0500 07/17/17 0735  BP: 134/76   Pulse: 96   Resp:    Temp: 36.5 C   SpO2: 94% 100%    Last Pain:  Vitals:   07/17/17 0500  TempSrc: Oral  PainSc:                  Ryan P Ellender

## 2017-07-18 ENCOUNTER — Inpatient Hospital Stay (HOSPITAL_COMMUNITY): Payer: Medicare Other

## 2017-07-18 DIAGNOSIS — I2510 Atherosclerotic heart disease of native coronary artery without angina pectoris: Secondary | ICD-10-CM

## 2017-07-18 LAB — BASIC METABOLIC PANEL
Anion gap: 7 (ref 5–15)
BUN: 17 mg/dL (ref 6–20)
CALCIUM: 8.4 mg/dL — AB (ref 8.9–10.3)
CHLORIDE: 98 mmol/L — AB (ref 101–111)
CO2: 24 mmol/L (ref 22–32)
Creatinine, Ser: 0.76 mg/dL (ref 0.61–1.24)
GFR calc non Af Amer: >60 mL/min (ref >60)
Glucose, Bld: 105 mg/dL — ABNORMAL HIGH (ref 65–99)
Potassium: 4.3 mmol/L (ref 3.5–5.1)
Sodium: 129 mmol/L — ABNORMAL LOW (ref 135–145)

## 2017-07-18 LAB — CBC
HEMATOCRIT: 34.5 % — AB (ref 39.0–52.0)
HEMOGLOBIN: 11.5 g/dL — AB (ref 13.0–17.0)
MCH: 30.7 pg (ref 26.0–34.0)
MCHC: 33.3 g/dL (ref 30.0–36.0)
MCV: 92 fL (ref 78.0–100.0)
Platelets: 290 10*3/uL (ref 150–400)
RBC: 3.75 MIL/uL — ABNORMAL LOW (ref 4.22–5.81)
RDW: 13.9 % (ref 11.5–15.5)
WBC: 12.9 10*3/uL — AB (ref 4.0–10.5)

## 2017-07-18 MED ORDER — POLYETHYLENE GLYCOL 3350 17 G PO PACK
17.0000 g | PACK | Freq: Two times a day (BID) | ORAL | Status: DC
  Administered 2017-07-18 – 2017-07-21 (×6): 17 g via ORAL
  Filled 2017-07-18 (×6): qty 1

## 2017-07-18 NOTE — Progress Notes (Signed)
Physical Therapy Treatment Patient Details Name: Theodore [name] MRN: [id number] DOB: 1956-04-23 Today's Date: 07/18/2017    History of Present Illness Theodore [name] is a 61 y.o. male who presents with left femoral neck fracture s/p mechanical fall PTA.  The pt had L direct anterior THR on 07-16-17.  Prior - without assistive devices (walker, cane, wheelchair).  Does live alone.  Pt does have COPD but was off his O2 prior to fall.    PT Comments    Patient was limited by DOE and SpO2 desat with mobility. Pt on 1L O2 via [place] beginning of session and SpO2 >92% at rest and with mobility SpO2 88-91% with 2L O2 via [place]. Continue to progress as tolerated.   Follow Up Recommendations  DC plan and follow up therapy as arranged by surgeon;Supervision for mobility/OOB     Equipment Recommendations   (pt to check wtih sister to see what equipment he has available)    Recommendations for Other Services       Precautions / Restrictions Precautions Precautions: Fall Precaution Comments: no hip precautions Restrictions Weight Bearing Restrictions: Yes LLE Weight Bearing: Weight bearing as tolerated    Mobility  Bed Mobility Overal bed mobility: Needs Assistance Bed Mobility: Supine to Sit     Supine to sit: Min assist;HOB elevated     General bed mobility comments: assist to bring L LE to EOB; cues for technique  Transfers Overall transfer level: Needs assistance Equipment used: Rolling walker (2 wheeled) Transfers: Sit to/from Stand Sit to Stand: Min assist         General transfer comment: cues for safe hand placement and positioning of feet in preparation to stand  Ambulation/Gait Ambulation/Gait assistance: Min assist Ambulation Distance (Feet): 10 Feet Assistive device: Rolling walker (2 wheeled) (chair following pt) Gait Pattern/deviations: Trunk flexed;Decreased stance time - left;Step-through pattern;Decreased step length - right;Decreased step length -  left;Antalgic Gait velocity: decreased   General Gait Details: cues for pursed lip breathing technique, sequencing, and posture; SpO2 88-91% on 2L O2 via [place] with mobility   Stairs            Wheelchair Mobility    Modified Rankin (Stroke Patients Only)       Balance Overall balance assessment: Needs assistance Sitting-balance support: No upper extremity supported;Feet supported Sitting balance-Leahy Scale: Good     Standing balance support: Bilateral upper extremity supported;During functional activity Standing balance-Leahy Scale: Fair Standing balance comment: Relies on RW for stability                            Cognition Arousal/Alertness: Awake/alert Behavior During Therapy: WFL for tasks assessed/performed Overall Cognitive Status: Within Functional Limits for tasks assessed                                        Exercises Total Joint Exercises Quad Sets: AROM;Left;10 reps;Supine Heel Slides: AROM;Left;10 reps;Supine Hip ABduction/ADduction: AROM;Left;10 reps;Supine    General Comments General comments (skin integrity, edema, etc.): pt encouraged to use IS 10 times every hour       Pertinent Vitals/Pain Pain Assessment: Faces Faces Pain Scale: Hurts little more Pain Location: left hip Pain Descriptors / Indicators: Burning;Operative site guarding Pain Intervention(s): Limited activity within patient's tolerance;Monitored during session;Premedicated before session;Repositioned    Home Living  Prior Function            PT Goals (current goals can now be found in the care plan section) Acute Rehab PT Goals Patient Stated Goal: To be independent PT Goal Formulation: With patient Time For Goal Achievement: 07/24/17 Potential to Achieve Goals: Good Progress towards PT goals: Progressing toward goals    Frequency    7X/week      PT Plan Current plan remains appropriate     Co-evaluation              AM-PAC PT "6 Clicks" Daily Activity  Outcome Measure  Difficulty turning over in bed (including adjusting bedclothes, sheets and blankets)?: A Little Difficulty moving from lying on back to sitting on the side of the bed? : A Little Difficulty sitting down on and standing up from a chair with arms (e.g., wheelchair, bedside commode, etc,.)?: A Little Help needed moving to and from a bed to chair (including a wheelchair)?: A Little Help needed walking in hospital room?: A Little Help needed climbing 3-5 steps with a railing? : A Lot 6 Click Score: 17    End of Session Equipment Utilized During Treatment: Gait belt;Oxygen Activity Tolerance: Other (comment) (limited by 3/4 DOE) Patient left: in chair;with call bell/phone within reach Nurse Communication: Mobility status PT Visit Diagnosis: Other abnormalities of gait and mobility (R26.89);Pain Pain - Right/Left: Left Pain - part of body: Hip     Time: 1132-1207 PT Time Calculation (min) (ACUTE ONLY): 35 min  Charges:  $Gait Training: 8-22 mins $Therapeutic Exercise: 8-22 mins                    G Codes:       Erline Levine, PTA Pager: [phone]     Carolynne Edouard 07/18/2017, 3:15 PM

## 2017-07-18 NOTE — Progress Notes (Signed)
PROGRESS NOTE    Theodore [name]  YQM:[id number] DOB: [birth year] DOA: 07/14/2017 PCP: [name], FNP   Brief Narrative: Theodore [name] is a 61 y.o. male with history of COPD (recently taken off oxygen by his pulmonologist), CAD, GERD, anxiety, history of PE on Xarelto presented to the ED after sustaining a mechanical fall at home and landed on his left hip. Patient denies loss of consciousness, dizziness, chest pain, headache, shortness of breath, fevers, nausea, vomiting, abdominal pain, bowel or urinary symptoms. In the ED vitals were stable. Blood work showed WBC of 17.7 K, sodium of 133 and potassium of 3.4. X-ray of the hip showed comminuted subcapital left femoral neck fracture. Orthopedics consulted who recommended transfer to [facility] for surgery on 9/7.   Assessment & Plan:   Active Problems:   COPD exacerbation (HCC)   DM (diabetes mellitus) (HCC)   CAD (coronary artery disease)   Left displaced femoral neck fracture (HCC)   Closed displaced fracture of left femoral neck (HCC)   Chronic respiratory failure with hypoxia (HCC)   Left displaced femoral neck fracture Surgery planned for today -ortho recommendations -continue analgesics -PT recommendations  COPD With exacerbation. Patient on chronic steroids -continue prednisone  -continue Duonebs -continue guaifenesin -continue azithromycin  Chronic respiratory failure Secondary to COPD. Weaned off of oxygen recently -keep O2 >88%, may need to go home on oxygen  History of NSTEMI No chest pain.  Essential hypertension Maxide held on admission. Normotensive.  History of PE -resume Xarelto pending ortho recommendations after surgery  History of diabetes mellitus Last A1C of 5.8. Not on therapy -continue dietary modifications    DVT prophylaxis: SCDs Code Status: Full code Family Communication: None at bedside Disposition Plan: Discharge pending orthopedic workup and improvement of COPD  exacerbation   Consultants:   Orthopedic surgery  Procedures:   None  Antimicrobials:  None    Subjective: Coughing still. Decreased sputum. Decreased wheezing.  Objective: Vitals:   07/17/17 2034 07/17/17 2048 07/18/17 0445 07/18/17 0735  BP:  138/64 131/78   Pulse:  90 (!) 117   Resp:  18 18   Temp:  98.5 F (36.9 C) 98.5 F (36.9 C)   TempSrc:  Oral Oral   SpO2: 97% 97% 97% 96%  Weight:      Height:        Intake/Output Summary (Last 24 hours) at 07/18/17 1308 Last data filed at 07/18/17 0446  Gross per 24 hour  Intake              720 ml  Output             1400 ml  Net             -680 ml   Filed Weights   07/16/17 1537  Weight: 95.1 kg (209 lb 9.6 oz)    Examination:  General exam: Appears calm and comfortable  Respiratory system: Prolonged expiratory phase with mild end expiratory wheezing. No distress Cardiovascular system: S1 & S2 heard, RRR. No murmurs. Gastrointestinal system: Abdomen is nondistended, soft and nontender. Normal bowel sounds heard. Central nervous system: Alert and oriented. No focal neurological deficits. Extremities: No edema. No calf tenderness Skin: No cyanosis. No rashes Psychiatry: Judgement and insight appear normal. Mood & affect appropriate.     Data Reviewed: I have personally reviewed following labs and imaging studies  CBC:  Recent Labs Lab 07/14/17 1101 07/15/17 0933 07/16/17 0445 07/17/17 0353 07/18/17 0355  WBC 17.7* 15.0* 12.2*  11.9* 12.9*  NEUTROABS 15.2*  --   --   --   --   HGB 15.3 14.6 13.0 11.5* 11.5*  HCT 42.9 43.5 39.8 35.4* 34.5*  MCV 89.7 94.2 93.9 93.4 92.0  PLT 364 322 278 263 290   Basic Metabolic Panel:  Recent Labs Lab 07/14/17 1101 07/15/17 0933 07/16/17 0445 07/17/17 0353 07/18/17 0355  NA 133* 130* 132* 131* 129*  K 3.4* 4.5 4.4 4.4 4.3  CL 94* 96* 101 101 98*  CO2 30 22 23 22 24   GLUCOSE 99 101* 98 148* 105*  BUN 12 14 14 15 17   CREATININE 0.99 0.88 0.89 0.98 0.76    CALCIUM 9.0 8.7* 8.5* 8.3* 8.4*   GFR: Estimated Creatinine Clearance: 116 mL/min (by C-G formula based on SCr of 0.76 mg/dL). Liver Function Tests: No results for input(s): AST, ALT, ALKPHOS, BILITOT, PROT, ALBUMIN in the last 168 hours. No results for input(s): LIPASE, AMYLASE in the last 168 hours. No results for input(s): AMMONIA in the last 168 hours. Coagulation Profile:  Recent Labs Lab 07/15/17 0933 07/16/17 1512  INR 1.21 1.15   Cardiac Enzymes:  Recent Labs Lab 07/14/17 1101  TROPONINI <0.03   BNP (last 3 results) No results for input(s): PROBNP in the last 8760 hours. HbA1C: No results for input(s): HGBA1C in the last 72 hours. CBG: No results for input(s): GLUCAP in the last 168 hours. Lipid Profile: No results for input(s): CHOL, HDL, LDLCALC, TRIG, CHOLHDL, LDLDIRECT in the last 72 hours. Thyroid Function Tests: No results for input(s): TSH, T4TOTAL, FREET4, T3FREE, THYROIDAB in the last 72 hours. Anemia Panel: No results for input(s): VITAMINB12, FOLATE, FERRITIN, TIBC, IRON, RETICCTPCT in the last 72 hours. Sepsis Labs: No results for input(s): PROCALCITON, LATICACIDVEN in the last 168 hours.  Recent Results (from the past 240 hour(s))  Surgical PCR screen     Status: None   Collection Time: 07/15/17  6:02 PM  Result Value Ref Range Status   MRSA, PCR NEGATIVE NEGATIVE Final   Staphylococcus aureus NEGATIVE NEGATIVE Final    Comment: (NOTE) The Xpert SA Assay (FDA approved for NASAL specimens in patients 63 years of age and older), is one component of a comprehensive surveillance program. It is not intended to diagnose infection nor to guide or monitor treatment.          Radiology Studies: Dg Abd 1 View  Result Date: 07/18/2017 CLINICAL DATA:  Acute abdominal distension. EXAM: ABDOMEN - 1 VIEW COMPARISON:  Radiographs of May 17, 2008. FINDINGS: Status post left hip arthroplasty. No small bowel dilatation is noted. Mildly dilated air-filled  transverse colon is noted which may represent ileus. Moderate amount of stool is seen in the right and proximal transverse colon. IMPRESSION: Moderate stool burden is noted. Mildly dilated air-filled transverse colon is noted which may represent ileus. No small bowel dilatation is noted. Electronically Signed   By: Lupita RaiderJames  Green Jr, M.D.   On: 07/18/2017 11:40   Pelvis Portable  Result Date: 07/16/2017 CLINICAL DATA:  Postop left hip arthroplasty EXAM: PORTABLE PELVIS 1-2 VIEWS COMPARISON:  07/14/2017 FINDINGS: Interval left hip replacement with normal alignment. Pubic symphysis is intact. Vascular calcifications. Moderate degenerative changes of the right hip. Cutaneous staples left hip. Moderate soft tissue gas. IMPRESSION: Interval left hip replacement with normal alignment. Expected postsurgical changes. Electronically Signed   By: Jasmine PangKim  Fujinaga M.D.   On: 07/16/2017 19:53   Dg Chest Port 1 View  Result Date: 07/17/2017 CLINICAL DATA:  61 year old  male with decreased breath sounds EXAM: PORTABLE CHEST 1 VIEW COMPARISON:  Prior chest x-ray 07/14/2017 FINDINGS: The lungs are clear and negative for focal airspace consolidation, pulmonary edema or suspicious pulmonary nodule. No pleural effusion or pneumothorax. Cardiac and mediastinal contours are within normal limits. No acute fracture or lytic or blastic osseous lesions. The visualized upper abdominal bowel gas pattern is unremarkable. IMPRESSION: Negative chest x-ray. Electronically Signed   By: Malachy Moan M.D.   On: 07/17/2017 10:48   Dg C-arm 1-60 Min  Result Date: 07/16/2017 CLINICAL DATA:  Left total hip arthroplasty. EXAM: OPERATIVE LEFT HIP WITH PELVIS; DG C-ARM 61-120 MIN COMPARISON:  Left hip radiographs 07/14/2017 FLUOROSCOPY TIME:  C-arm fluoroscopic images were obtained intraoperatively and submitted for post operative interpretation. Please see the performing provider's procedural report for the fluoroscopy time utilized. FINDINGS: Two  intraoperative spot fluoroscopic images are provided. A left total hip arthroplasty has been performed. The prosthetic components appear normally located on these limited images. IMPRESSION: Intraoperative images during left hip arthroplasty. Electronically Signed   By: Sebastian Ache M.D.   On: 07/16/2017 18:05   Dg Hip Operative Unilat W Or W/o Pelvis Left  Result Date: 07/16/2017 CLINICAL DATA:  Left total hip arthroplasty. EXAM: OPERATIVE LEFT HIP WITH PELVIS; DG C-ARM 61-120 MIN COMPARISON:  Left hip radiographs 07/14/2017 FLUOROSCOPY TIME:  C-arm fluoroscopic images were obtained intraoperatively and submitted for post operative interpretation. Please see the performing provider's procedural report for the fluoroscopy time utilized. FINDINGS: Two intraoperative spot fluoroscopic images are provided. A left total hip arthroplasty has been performed. The prosthetic components appear normally located on these limited images. IMPRESSION: Intraoperative images during left hip arthroplasty. Electronically Signed   By: Sebastian Ache M.D.   On: 07/16/2017 18:05        Scheduled Meds: . azithromycin  250 mg Oral Daily  . docusate sodium  100 mg Oral BID  . guaiFENesin  1,200 mg Oral BID  . ipratropium-albuterol  3 mL Nebulization TID  . mometasone-formoterol  2 puff Inhalation BID  . polyethylene glycol  17 g Oral BID  . predniSONE  40 mg Oral Q breakfast  . rivaroxaban  20 mg Oral Q supper   Continuous Infusions: . lactated ringers 10 mL/hr at 07/16/17 1540  . methocarbamol (ROBAXIN)  IV       LOS: 4 days     Jacquelin Hawking, MD [facility] 07/18/2017, 1:08 PM Pager: [phone]  If 7PM-7AM, please contact night-coverage [email or web address] Password [email or web address] 07/18/2017, 1:08 PM

## 2017-07-19 ENCOUNTER — Encounter (HOSPITAL_COMMUNITY): Payer: Self-pay | Admitting: Orthopaedic Surgery

## 2017-07-19 DIAGNOSIS — R0689 Other abnormalities of breathing: Secondary | ICD-10-CM

## 2017-07-19 DIAGNOSIS — R140 Abdominal distension (gaseous): Secondary | ICD-10-CM

## 2017-07-19 LAB — CBC
HCT: 32.9 % — ABNORMAL LOW (ref 39.0–52.0)
Hemoglobin: 10.8 g/dL — ABNORMAL LOW (ref 13.0–17.0)
MCH: 30.4 pg (ref 26.0–34.0)
MCHC: 32.8 g/dL (ref 30.0–36.0)
MCV: 92.7 fL (ref 78.0–100.0)
PLATELETS: 267 10*3/uL (ref 150–400)
RBC: 3.55 MIL/uL — AB (ref 4.22–5.81)
RDW: 14.1 % (ref 11.5–15.5)
WBC: 10.8 10*3/uL — AB (ref 4.0–10.5)

## 2017-07-19 MED ORDER — GUAIFENESIN-DM 100-10 MG/5ML PO SYRP
10.0000 mL | ORAL_SOLUTION | ORAL | Status: DC | PRN
  Administered 2017-07-19: 10 mL via ORAL
  Filled 2017-07-19: qty 10

## 2017-07-19 NOTE — Care Management Important Message (Signed)
Important Message  Patient Details  Name: Theodore GuestJames W Hopfensperger MRN: [id number] Date of Birth: 1956-08-18   Medicare Important Message Given:  Yes    Anntionette Madkins 07/19/2017, 1:22 PM

## 2017-07-19 NOTE — Social Work (Signed)
CSW met with patient to discuss bed offers. CSW provided patient the list of SNF's that have offered bed. Pt will review and CSW will f/u.  Elissa Hefty, LCSW Clinical Social Worker [phone]

## 2017-07-19 NOTE — Progress Notes (Signed)
Physical Therapy Treatment Patient Details Name: Theodore GuestJames W [name] MRN: [id number] DOB: 04-29-1956 Today's Date: 07/19/2017    History of Present Illness 61 yo male s/p fall with left femur fx s/p THA anterior approach 9/7. PMhx: COPD not on home O2, coronary artery disease, GERD and anxiety, pulmonary embolism    PT Comments    Pt pleasant and willing to mobilize. On arrival pt 100% on 2L [place] with transition to RA SpO2 97% and maintained 92-95% with mobility. At rest in chair pt sats 94% with 2 brief drops to 84% with kick 5 second rebound back to the 90s. Pt educated for HEP and activity progression. Will continue to follow.     Follow Up Recommendations  SNF;Supervision/Assistance - 24 hour     Equipment Recommendations  Rolling walker with 5" wheels;3in1 (PT)    Recommendations for Other Services       Precautions / Restrictions Precautions Precautions: Fall Precaution Comments: watch sats Restrictions Weight Bearing Restrictions: Yes LLE Weight Bearing: Weight bearing as tolerated    Mobility  Bed Mobility Overal bed mobility: Needs Assistance Bed Mobility: Supine to Sit     Supine to sit: Supervision;HOB elevated     General bed mobility comments: HOB 30 degree with use of rail but no physical assist  Transfers Overall transfer level: Needs assistance   Transfers: Sit to/from Stand Sit to Stand: Min guard         General transfer comment: cues for hand placement  Ambulation/Gait Ambulation/Gait assistance: Min guard Ambulation Distance (Feet): 35 Feet Assistive device: Rolling walker (2 wheeled) Gait Pattern/deviations: Step-to pattern;Trunk flexed;Decreased stance time - left   Gait velocity interpretation: Below normal speed for age/gender General Gait Details: cues for posture, position in RW and sequence. Pt utilizing pursed lip breathing throughout   Stairs            Wheelchair Mobility    Modified Rankin (Stroke Patients Only)        Balance Overall balance assessment: Needs assistance   Sitting balance-Leahy Scale: Good       Standing balance-Leahy Scale: Fair                              Cognition Arousal/Alertness: Awake/alert Behavior During Therapy: WFL for tasks assessed/performed Overall Cognitive Status: Within Functional Limits for tasks assessed                                        Exercises Total Joint Exercises Heel Slides: AROM;Left;Supine;10 reps Hip ABduction/ADduction: AROM;Left;Supine;10 reps Long Arc Quad: AROM;Left;Seated;10 reps Marching in Standing: AROM;Left;Seated;10 reps    General Comments        Pertinent Vitals/Pain Pain Score: 5  Pain Location: left hip Pain Descriptors / Indicators: Burning Pain Intervention(s): Limited activity within patient's tolerance;Repositioned;RN gave pain meds during session    Home Living                      Prior Function            PT Goals (current goals can now be found in the care plan section) Progress towards PT goals: Progressing toward goals    Frequency    Min 5X/week      PT Plan Current plan remains appropriate    Co-evaluation  AM-PAC PT "6 Clicks" Daily Activity  Outcome Measure  Difficulty turning over in bed (including adjusting bedclothes, sheets and blankets)?: A Little Difficulty moving from lying on back to sitting on the side of the bed? : A Little Difficulty sitting down on and standing up from a chair with arms (e.g., wheelchair, bedside commode, etc,.)?: A Little Help needed moving to and from a bed to chair (including a wheelchair)?: A Little Help needed walking in hospital room?: A Little Help needed climbing 3-5 steps with a railing? : A Lot 6 Click Score: 17    End of Session Equipment Utilized During Treatment: Gait belt Activity Tolerance: Patient tolerated treatment well Patient left: in chair;with call bell/phone within  reach;with chair alarm set;with nursing/sitter in room Nurse Communication: Mobility status PT Visit Diagnosis: Other abnormalities of gait and mobility (R26.89);Difficulty in walking, not elsewhere classified (R26.2);History of falling (Z91.81) Pain - Right/Left: Left Pain - part of body: Hip     Time: 0981-1914 PT Time Calculation (min) (ACUTE ONLY): 25 min  Charges:  $Gait Training: 8-22 mins $Therapeutic Exercise: 8-22 mins                    G Codes:       Theodore [name], PT [phone]   Theodore [name] 07/19/2017, 11:32 AM

## 2017-07-19 NOTE — Progress Notes (Signed)
PROGRESS NOTE    Theodore [name]  [id number]:[id number] DOB: 1956-05-26 DOA: 07/14/2017 PCP: [name], FNP   Brief Narrative: Theodore [name] is a 61 y.o. male with history of COPD (recently taken off oxygen by his pulmonologist), CAD, GERD, anxiety, history of PE on Xarelto presented to the ED after sustaining a mechanical fall at home and landed on his left hip. Patient denies loss of consciousness, dizziness, chest pain, headache, shortness of breath, fevers, nausea, vomiting, abdominal pain, bowel or urinary symptoms. In the ED vitals were stable. Blood work showed WBC of 17.7 K, sodium of 133 and potassium of 3.4. X-ray of the hip showed comminuted subcapital left femoral neck fracture. Orthopedics consulted who recommended transfer to [facility] for surgery on 9/7.   Assessment & Plan:   Active Problems:   COPD exacerbation (HCC)   DM (diabetes mellitus) (HCC)   CAD (coronary artery disease)   Left displaced femoral neck fracture (HCC)   Closed displaced fracture of left femoral neck (HCC)   Chronic respiratory failure with hypoxia (HCC)   Left displaced femoral neck fracture Surgery planned for today -ortho recommendations -continue analgesics -PT recommendations  COPD With exacerbation. Patient on chronic steroids. Slightly worsened today likely secondary to reflux -continue prednisone  burst -continue Duonebs -continue guaifenesin -continue azithromycin  Chronic respiratory failure Secondary to COPD. Weaned off of oxygen recently -keep O2 >88%, may need to go home on oxygen  History of NSTEMI No chest pain.  Essential hypertension Maxide held on admission. Normotensive.  History of PE -resume Xarelto pending ortho recommendations after surgery  History of diabetes mellitus Last A1C of 5.8. Not on therapy -continue dietary modifications  GERD Patient states he does not tolerate Protonix or Pepcid -Tums TID  Ileus Secondary to narcotics and  surgery. Passing gas. Mild abdominal pain. -continue Miralax    DVT prophylaxis: SCDs Code Status: Full code Family Communication: None at bedside Disposition Plan: Discharge pending orthopedic workup and improvement of COPD exacerbation   Consultants:   Orthopedic surgery  Procedures:   None  Antimicrobials:  None    Subjective: Persistent cough. Lost voice overnight. Reflux.  Objective: Vitals:   07/18/17 1935 07/18/17 2039 07/19/17 0300 07/19/17 0921  BP: 113/75  126/70   Pulse: 87  77   Resp: 18  18   Temp: 97.7 F (36.5 C)  (!) 97.5 F (36.4 C)   TempSrc: Oral  Oral   SpO2: 96% 97% 99% 97%  Weight:      Height:        Intake/Output Summary (Last 24 hours) at 07/19/17 1000 Last data filed at 07/19/17 0211  Gross per 24 hour  Intake              720 ml  Output             1450 ml  Net             -730 ml   Filed Weights   07/16/17 1537  Weight: 95.1 kg (209 lb 9.6 oz)    Examination:  General exam: Appears calm and comfortable  Respiratory system: Prolonged expiratory phase with moderate end expiratory wheezing. No distress Cardiovascular system: S1 & S2 heard, RRR. No murmurs. Gastrointestinal system: Abdomen is distended, soft and nontender. Diminished bowel sounds heard. Central nervous system: Alert and oriented. No focal neurological deficits. Extremities: No edema. No calf tenderness Skin: No cyanosis. No rashes Psychiatry: Judgement and insight appear normal. Mood & affect appropriate.  Data Reviewed: I have personally reviewed following labs and imaging studies  CBC:  Recent Labs Lab 07/14/17 1101 07/15/17 0933 07/16/17 0445 07/17/17 0353 07/18/17 0355 07/19/17 0248  WBC 17.7* 15.0* 12.2* 11.9* 12.9* 10.8*  NEUTROABS 15.2*  --   --   --   --   --   HGB 15.3 14.6 13.0 11.5* 11.5* 10.8*  HCT 42.9 43.5 39.8 35.4* 34.5* 32.9*  MCV 89.7 94.2 93.9 93.4 92.0 92.7  PLT 364 322 278 263 290 267   Basic Metabolic  Panel:  Recent Labs Lab 07/14/17 1101 07/15/17 0933 07/16/17 0445 07/17/17 0353 07/18/17 0355  NA 133* 130* 132* 131* 129*  K 3.4* 4.5 4.4 4.4 4.3  CL 94* 96* 101 101 98*  CO2 30 22 23 22 24   GLUCOSE 99 101* 98 148* 105*  BUN 12 14 14 15 17   CREATININE 0.99 0.88 0.89 0.98 0.76  CALCIUM 9.0 8.7* 8.5* 8.3* 8.4*   GFR: Estimated Creatinine Clearance: 116 mL/min (by C-G formula based on SCr of 0.76 mg/dL). Liver Function Tests: No results for input(s): AST, ALT, ALKPHOS, BILITOT, PROT, ALBUMIN in the last 168 hours. No results for input(s): LIPASE, AMYLASE in the last 168 hours. No results for input(s): AMMONIA in the last 168 hours. Coagulation Profile:  Recent Labs Lab 07/15/17 0933 07/16/17 1512  INR 1.21 1.15   Cardiac Enzymes:  Recent Labs Lab 07/14/17 1101  TROPONINI <0.03   BNP (last 3 results) No results for input(s): PROBNP in the last 8760 hours. HbA1C: No results for input(s): HGBA1C in the last 72 hours. CBG: No results for input(s): GLUCAP in the last 168 hours. Lipid Profile: No results for input(s): CHOL, HDL, LDLCALC, TRIG, CHOLHDL, LDLDIRECT in the last 72 hours. Thyroid Function Tests: No results for input(s): TSH, T4TOTAL, FREET4, T3FREE, THYROIDAB in the last 72 hours. Anemia Panel: No results for input(s): VITAMINB12, FOLATE, FERRITIN, TIBC, IRON, RETICCTPCT in the last 72 hours. Sepsis Labs: No results for input(s): PROCALCITON, LATICACIDVEN in the last 168 hours.  Recent Results (from the past 240 hour(s))  Surgical PCR screen     Status: None   Collection Time: 07/15/17  6:02 PM  Result Value Ref Range Status   MRSA, PCR NEGATIVE NEGATIVE Final   Staphylococcus aureus NEGATIVE NEGATIVE Final    Comment: (NOTE) The Xpert SA Assay (FDA approved for NASAL specimens in patients 63 years of age and older), is one component of a comprehensive surveillance program. It is not intended to diagnose infection nor to guide or monitor  treatment.          Radiology Studies: Dg Abd 1 View  Result Date: 07/18/2017 CLINICAL DATA:  Acute abdominal distension. EXAM: ABDOMEN - 1 VIEW COMPARISON:  Radiographs of May 17, 2008. FINDINGS: Status post left hip arthroplasty. No small bowel dilatation is noted. Mildly dilated air-filled transverse colon is noted which may represent ileus. Moderate amount of stool is seen in the right and proximal transverse colon. IMPRESSION: Moderate stool burden is noted. Mildly dilated air-filled transverse colon is noted which may represent ileus. No small bowel dilatation is noted. Electronically Signed   By: Lupita RaiderJames  Green Jr, M.D.   On: 07/18/2017 11:40   Dg Chest Port 1 View  Result Date: 07/17/2017 CLINICAL DATA:  61 year old male with decreased breath sounds EXAM: PORTABLE CHEST 1 VIEW COMPARISON:  Prior chest x-ray 07/14/2017 FINDINGS: The lungs are clear and negative for focal airspace consolidation, pulmonary edema or suspicious pulmonary nodule. No pleural effusion  or pneumothorax. Cardiac and mediastinal contours are within normal limits. No acute fracture or lytic or blastic osseous lesions. The visualized upper abdominal bowel gas pattern is unremarkable. IMPRESSION: Negative chest x-ray. Electronically Signed   By: Malachy Moan M.D.   On: 07/17/2017 10:48        Scheduled Meds: . azithromycin  250 mg Oral Daily  . docusate sodium  100 mg Oral BID  . ipratropium-albuterol  3 mL Nebulization TID  . mometasone-formoterol  2 puff Inhalation BID  . polyethylene glycol  17 g Oral BID  . predniSONE  40 mg Oral Q breakfast  . rivaroxaban  20 mg Oral Q supper   Continuous Infusions: . lactated ringers 10 mL/hr at 07/16/17 1540  . methocarbamol (ROBAXIN)  IV       LOS: 5 days     Jacquelin Hawking, MD [facility] 07/19/2017, 10:00 AM Pager: [phone]  If 7PM-7AM, please contact night-coverage [email or web address] Password [email or web address] 07/19/2017, 10:00 AM

## 2017-07-19 NOTE — Social Work (Signed)
CSW met with patient and he selected [facility] for short term rehab.  CSW confirmed bed offer with SNF.  CSW will f/u tomorrow for disposition to SNF.  Elissa Hefty, LCSW Clinical Social Worker [phone]

## 2017-07-19 NOTE — Care Management Note (Signed)
Case Management Note  Patient Details  Name: Theodore [name] MRN: [id number] Date of Birth: 1956-10-31  Subjective/Objective:                    Action/Plan: Plan is for SNF when medically ready. CM following.  Expected Discharge Date:   (unknown)               Expected Discharge Plan:     In-House Referral:     Discharge planning Services     Post Acute Care Choice:    Choice offered to:     DME Arranged:    DME Agency:     HH Arranged:    HH Agency:     Status of Service:     If discussed at [employer] Meetings, dates discussed:    Additional Comments:  Theodore BaloKelli F Britton Perkinson, RN 07/19/2017, 4:05 PM

## 2017-07-20 DIAGNOSIS — K567 Ileus, unspecified: Secondary | ICD-10-CM

## 2017-07-20 MED ORDER — GUAIFENESIN ER 600 MG PO TB12
1200.0000 mg | ORAL_TABLET | Freq: Two times a day (BID) | ORAL | Status: DC
  Administered 2017-07-20 – 2017-07-21 (×3): 1200 mg via ORAL
  Filled 2017-07-20 (×3): qty 2

## 2017-07-20 MED ORDER — METOCLOPRAMIDE HCL 5 MG/ML IJ SOLN
10.0000 mg | Freq: Three times a day (TID) | INTRAMUSCULAR | Status: AC
Start: 2017-07-20 — End: 2017-07-20
  Administered 2017-07-20 (×2): 10 mg via INTRAVENOUS
  Filled 2017-07-20 (×2): qty 2

## 2017-07-20 MED ORDER — METOCLOPRAMIDE HCL 5 MG/ML IJ SOLN: 10.0000 mg | Freq: Three times a day (TID) | INTRAMUSCULAR | Status: DC

## 2017-07-20 MED ORDER — BENZONATATE 100 MG PO CAPS
200.0000 mg | ORAL_CAPSULE | Freq: Three times a day (TID) | ORAL | Status: DC
  Administered 2017-07-20 – 2017-07-21 (×5): 200 mg via ORAL
  Filled 2017-07-20 (×5): qty 2

## 2017-07-20 NOTE — Social Work (Addendum)
CSW still following up for disposition.  [facility] when medically [facility].  Keene BreathPatricia Kendrew Paci, LCSW Clinical Social Worker [phone]

## 2017-07-20 NOTE — Progress Notes (Signed)
PROGRESS NOTE    Theodore GuestJames W Pettway  YNW:[id number] DOB: Apr 04, 1956 DOA: 07/14/2017 PCP: Veryl Speakalone, Gregory D, FNP   Brief Narrative: Theodore [name] is a 61 y.o. male with history of COPD (recently taken off oxygen by his pulmonologist), CAD, GERD, anxiety, history of PE on Xarelto presented to the ED after sustaining a mechanical fall at home and landed on his left hip. Patient denies loss of consciousness, dizziness, chest pain, headache, shortness of breath, fevers, nausea, vomiting, abdominal pain, bowel or urinary symptoms. In the ED vitals were stable. Blood work showed WBC of 17.7 K, sodium of 133 and potassium of 3.4. X-ray of the hip showed comminuted subcapital left femoral neck fracture. Orthopedics consulted who [facility]7.   Assessment & Plan:   Active Problems:   COPD exacerbation (HCC)   DM (diabetes mellitus) (HCC)   CAD (coronary artery disease)   Left displaced femoral neck fracture (HCC)   Closed displaced fracture of left femoral neck (HCC)   Chronic respiratory failure with hypoxia (HCC)   Left displaced femoral neck fracture Surgery planned for today -ortho recommendations -continue analgesics -PT recommendations: SNF  COPD With exacerbation. Patient on chronic steroids. Slightly worsened today likely secondary to reflux. On prednisone chronically as an outpatient. -continue prednisone 40mg  burst -continue Duonebs -continue guaifenesin -start tessalon perles -continue azithromycin  Chronic respiratory failure Secondary to COPD. Weaned off of oxygen recently -keep O2 >88%, may need to go home on oxygen  History of NSTEMI No chest pain.  Essential hypertension Maxide held on admission. Normotensive.  History of PE -Continu Xarelto  History of diabetes mellitus Last A1C of 5.8. Not on therapy -continue dietary modifications  GERD Patient states he does not tolerate Protonix or Pepcid -Tums  TID  Ileus Secondary to narcotics and surgery. Passing gas. No bowel movement yet -continue Miralax -reglan x2 doses    DVT prophylaxis: Xarelto Code Status: Full code Family Communication: None at bedside Disposition Plan: Discharge pending orthopedic workup and improvement of COPD exacerbation   Consultants:   Orthopedic surgery  Procedures:   None  Antimicrobials:  None    Subjective: Cough continues and is productive. Flutter helping  Objective: Vitals:   07/19/17 1500 07/19/17 2025 07/19/17 2300 07/20/17 0503  BP: 113/67  128/73 124/83  Pulse: 99  84 (!) 123  Resp: 18  18 19   Temp: 98 F (36.7 C)  98.2 F (36.8 C) 98 F (36.7 C)  TempSrc: Oral  Oral Oral  SpO2: 95% 96% 98% 100%  Weight:      Height:        Intake/Output Summary (Last 24 hours) at 07/20/17 1003 Last data filed at 07/20/17 0500  Gross per 24 hour  Intake              360 ml  Output              650 ml  Net             -290 ml   Filed Weights   07/16/17 1537  Weight: 95.1 kg (209 lb 9.6 oz)    Examination:  General exam: Appears calm and comfortable  Respiratory system: Prolonged expiratory phase with mild end expiratory wheezing. No distress Cardiovascular system: S1 & S2 heard, RRR. No murmurs. Gastrointestinal system: Abdomen is distended, soft and nontender. Diminished bowel sounds heard. Central nervous system: Alert and oriented. No focal neurological deficits. Extremities: No edema. No calf tenderness Skin: No  cyanosis. No rashes Psychiatry: Judgement and insight appear normal. Mood & affect appropriate.     Data Reviewed: I have personally reviewed following labs and imaging studies  CBC:  Recent Labs Lab 07/14/17 1101 07/15/17 0933 07/16/17 0445 07/17/17 0353 07/18/17 0355 07/19/17 0248  WBC 17.7* 15.0* 12.2* 11.9* 12.9* 10.8*  NEUTROABS 15.2*  --   --   --   --   --   HGB 15.3 14.6 13.0 11.5* 11.5* 10.8*  HCT 42.9 43.5 39.8 35.4* 34.5* 32.9*  MCV 89.7  94.2 93.9 93.4 92.0 92.7  PLT 364 322 278 263 290 267   Basic Metabolic Panel:  Recent Labs Lab 07/14/17 1101 07/15/17 0933 07/16/17 0445 07/17/17 0353 07/18/17 0355  NA 133* 130* 132* 131* 129*  K 3.4* 4.5 4.4 4.4 4.3  CL 94* 96* 101 101 98*  CO2 GLUCOSE 99 101* 98 148* 105*  BUN CREATININE 0.99 0.88 0.89 0.98 0.76  CALCIUM 9.0 8.7* 8.5* 8.3* 8.4*   GFR: Estimated Creatinine Clearance: 116 mL/min (by C-G formula based on SCr of 0.76 mg/dL). Liver Function Tests: No results for input(s): AST, ALT, ALKPHOS, BILITOT, PROT, ALBUMIN in the last 168 hours. No results for input(s): LIPASE, AMYLASE in the last 168 hours. No results for input(s): AMMONIA in the last 168 hours. Coagulation Profile:  Recent Labs Lab 07/15/17 0933 07/16/17 1512  INR 1.21 1.15   Cardiac Enzymes:  Recent Labs Lab 07/14/17 1101  TROPONINI <0.03   BNP (last 3 results) No results for input(s): PROBNP in the last 8760 hours. HbA1C: No results for input(s): HGBA1C in the last 72 hours. CBG: No results for input(s): GLUCAP in the last 168 hours. Lipid Profile: No results for input(s): CHOL, HDL, LDLCALC, TRIG, CHOLHDL, LDLDIRECT in the last 72 hours. Thyroid Function Tests: No results for input(s): TSH, T4TOTAL, FREET4, T3FREE, THYROIDAB in the last 72 hours. Anemia Panel: No results for input(s): VITAMINB12, FOLATE, FERRITIN, TIBC, IRON, RETICCTPCT in the last 72 hours. Sepsis Labs: No results for input(s): PROCALCITON, LATICACIDVEN in the last 168 hours.  Recent Results (from the past 240 hour(s))  Surgical PCR screen     Status: None   Collection Time: 07/15/17  6:02 PM  Result Value Ref Range Status   MRSA, PCR NEGATIVE NEGATIVE Final   Staphylococcus aureus NEGATIVE NEGATIVE Final    Comment: (NOTE) The Xpert SA Assay (FDA approved for NASAL specimens in patients 4 years of age and older), is one component of a comprehensive surveillance program. It  is not intended to diagnose infection nor to guide or monitor treatment.          Radiology Studies: Dg Abd 1 View  Result Date: 07/18/2017 CLINICAL DATA:  Acute abdominal distension. EXAM: ABDOMEN - 1 VIEW COMPARISON:  Radiographs of May 17, 2008. FINDINGS: Status post left hip arthroplasty. No small bowel dilatation is noted. Mildly dilated air-filled transverse colon is noted which may represent ileus. Moderate amount of stool is seen in the right and proximal transverse colon. IMPRESSION: Moderate stool burden is noted. Mildly dilated air-filled transverse colon is noted which may represent ileus. No small bowel dilatation is noted. Electronically Signed   By: Lupita Raider, M.D.   On: 07/18/2017 11:40        Scheduled Meds: . azithromycin  250 mg Oral Daily  . benzonatate  200 mg Oral TID  . docusate sodium  100 mg Oral BID  .  guaiFENesin  1,200 mg Oral BID  . ipratropium-albuterol  3 mL Nebulization TID  . metoCLOPramide (REGLAN) injection  10 mg Intravenous Q8H  . mometasone-formoterol  2 puff Inhalation BID  . polyethylene glycol  17 g Oral BID  . predniSONE  40 mg Oral Q breakfast  . rivaroxaban  20 mg Oral Q supper   Continuous Infusions: . lactated ringers 10 mL/hr at 07/16/17 1540  . methocarbamol (ROBAXIN)  IV       LOS: 6 days     Jacquelin Hawking, MD [facility] 07/20/2017, 10:03 AM Pager: [phone]  If 7PM-7AM, please contact night-coverage [email or web address] Password [email or web address] 07/20/2017, 10:03 AM

## 2017-07-20 NOTE — Progress Notes (Signed)
Physical Therapy Treatment Patient Details Name: Theodore [name] MRN: [id number] DOB: Jun 13, 1956 Today's Date: 07/20/2017    History of Present Illness 61 yo male s/p fall with left femur fx s/p THA anterior approach 9/7. PMhx: COPD not on home O2, coronary artery disease, GERD and anxiety, pulmonary embolism    PT Comments    Pt performed limited gait as he remains to fatigue quickly with increased WOB and desaturation.  Pt O2 sats ranged from 80%-92% on RA during tx with increased time to recover and at one point post gait he required 2L to recover.  Nursing in room post gait and adminstered a breathing tx.  Pt wishing to rest and therapeutic exercise limited during session.     Follow Up Recommendations  SNF;Supervision/Assistance - 24 hour     Equipment Recommendations       Recommendations for Other Services       Precautions / Restrictions Precautions Precautions: Fall Precaution Comments: watch sats Restrictions Weight Bearing Restrictions: Yes LLE Weight Bearing: Weight bearing as tolerated    Mobility  Bed Mobility               General bed mobility comments: OOB in recliner   Transfers Overall transfer level: Needs assistance Equipment used: Rolling walker (2 wheeled) Transfers: Sit to/from Stand Sit to Stand: Min assist         General transfer comment: assist to rise and steady   Ambulation/Gait Ambulation/Gait assistance: Min guard Ambulation Distance (Feet): 30 Feet (after 15 ft O2 sats dropped to 82% on RA.  ) Assistive device: Rolling walker (2 wheeled) Gait Pattern/deviations: Step-to pattern;Trunk flexed;Decreased stance time - left Gait velocity: decreased Gait velocity interpretation: Below normal speed for age/gender General Gait Details: cues for posture, position in RW and sequence. Pt utilizing pursed lip breathing throughout.  After 15 ft patient required standing rest break in hall around 45 seconds for O2 sats to improve greater  than 90%.  Pt performed 15 ft back to room and after sitting he dropped to 80% and required 2L to recover to 94%.  RN entered room and issued a breathing tx.     Stairs            Wheelchair Mobility    Modified Rankin (Stroke Patients Only)       Balance Overall balance assessment: Needs assistance Sitting-balance support: No upper extremity supported;Feet supported Sitting balance-Leahy Scale: Good     Standing balance support: Bilateral upper extremity supported;During functional activity Standing balance-Leahy Scale: Fair Standing balance comment: Relies on RW for stability                            Cognition Arousal/Alertness: Awake/alert Behavior During Therapy: WFL for tasks assessed/performed Overall Cognitive Status: Within Functional Limits for tasks assessed                                        Exercises Total Joint Exercises Long Arc Quad: AROM;10 reps;Left;Seated    General Comments        Pertinent Vitals/Pain Pain Assessment: 0-10 Pain Score: 6  Faces Pain Scale: Hurts a little bit Pain Location: left hip Pain Descriptors / Indicators: Burning;Sore Pain Intervention(s): Monitored during session;Repositioned    Home Living  Prior Function            PT Goals (current goals can now be found in the care plan section) Acute Rehab PT Goals Patient Stated Goal: To be independent Potential to Achieve Goals: Good Progress towards PT goals: Progressing toward goals    Frequency    Min 5X/week      PT Plan Current plan remains appropriate    Co-evaluation              AM-PAC PT "6 Clicks" Daily Activity  Outcome Measure  Difficulty turning over in bed (including adjusting bedclothes, sheets and blankets)?: A Little Difficulty moving from lying on back to sitting on the side of the bed? : A Little Difficulty sitting down on and standing up from a chair with arms (e.g.,  wheelchair, bedside commode, etc,.)?: Unable Help needed moving to and from a bed to chair (including a wheelchair)?: A Little Help needed walking in hospital room?: A Little Help needed climbing 3-5 steps with a railing? : A Lot 6 Click Score: 15    End of Session Equipment Utilized During Treatment: Gait belt Activity Tolerance: Patient limited by fatigue (desaturated with activity.  ) Patient left: in chair;with call bell/phone within reach;with nursing/sitter in room Nurse Communication: Mobility status PT Visit Diagnosis: Other abnormalities of gait and mobility (R26.89);Difficulty in walking, not elsewhere classified (R26.2);History of falling (Z91.81) Pain - Right/Left: Left Pain - part of body: Hip     Time: 1610-9604 PT Time Calculation (min) (ACUTE ONLY): 22 min  Charges:  $Gait Training: 8-22 mins                    G Codes:       Joycelyn Rua, PTA pager [phone]    Florestine Avers 07/20/2017, 4:33 PM

## 2017-07-20 NOTE — Progress Notes (Signed)
Occupational Therapy Treatment Patient Details Name: Theodore GuestJames W Zeien MRN: [id number] DOB: 1956/03/18 Today's Date: 07/20/2017    History of present illness 61 yo male s/p fall with left femur fx s/p THA anterior approach 9/7. PMhx: COPD not on home O2, coronary artery disease, GERD and anxiety, pulmonary embolism   OT comments  Pt progressing toward goals, mostly limited by dyspnea with mobility. Educated on energy conservation and deep breathing techniques throughout session. Pt completes functional mobility and toilet transfer at RW with overall MinA, seated grooming ADLs with setup, requires seated rest breaks throughout. Based upon Pt's current level feel SNF recommendation remains appropriate at this time. Will continue to follow acutely to progress Pt's activity tolerance, safety, and independence with ADLs and functional mobility.   Follow Up Recommendations  SNF;Supervision/Assistance - 24 hour    Equipment Recommendations  None recommended by OT          Precautions / Restrictions Precautions Precautions: Fall Precaution Comments: watch sats Restrictions Weight Bearing Restrictions: Yes LLE Weight Bearing: Weight bearing as tolerated       Mobility Bed Mobility               General bed mobility comments: OOB in recliner   Transfers Overall transfer level: Needs assistance Equipment used: Rolling walker (2 wheeled) Transfers: Sit to/from Stand Sit to Stand: Min assist         General transfer comment: assist to rise and steady     Balance Overall balance assessment: Needs assistance Sitting-balance support: No upper extremity supported;Feet supported Sitting balance-Leahy Scale: Good     Standing balance support: Bilateral upper extremity supported;During functional activity Standing balance-Leahy Scale: Fair Standing balance comment: Relies on RW for stability                           ADL either performed or assessed with clinical  judgement   ADL Overall ADL's : Needs assistance/impaired     Grooming: Set up;Sitting                   Toilet Transfer: Minimal assistance;Ambulation;RW;Regular Toilet;Grab bars   Toileting- Clothing Manipulation and Hygiene: Maximal assistance;Sit to/from stand;Sitting/lateral lean Toileting - Clothing Manipulation Details (indicate cue type and reason): Pt attempting lateral leans to complete peri-care after BM, requires additional assist from therapist while Pt stands at RW      Functional mobility during ADLs: Minimal assistance;Rolling walker General ADL Comments: Pt with increased dyspnea during mobility; initially on RA with sats above 95% start of session. O2 sats dropped to 83-84% after mobility from recliner to toilet in bathroom on RA, returning to 90% and above within approx 30 seconds and deep breathing; administered 1L O2 for functional mobility back to recliner with sats dropping to high 80's, returning to above 90% after seated rest break and deep breathing; educated Pt on energy conservation techniques, deep breathing techniques; Pt requires extended seated rest breaks throughout; O2 sats above 95% on RA end of session                       Cognition Arousal/Alertness: Awake/alert Behavior During Therapy: WFL for tasks assessed/performed Overall Cognitive Status: Within Functional Limits for tasks assessed  Pertinent Vitals/ Pain       Pain Assessment: Faces Faces Pain Scale: Hurts a little bit Pain Location: left hip Pain Descriptors / Indicators: Burning;Sore Pain Intervention(s): Limited activity within patient's tolerance;Monitored during session;Repositioned                                                          Frequency  Min 2X/week        Progress Toward Goals  OT Goals(current goals can now be found in the care plan  section)  Progress towards OT goals: Progressing toward goals  Acute Rehab OT Goals Patient Stated Goal: To be independent OT Goal Formulation: With patient Time For Goal Achievement: 07/31/17 Potential to Achieve Goals: Good  Plan Discharge plan remains appropriate                     AM-PAC PT "6 Clicks" Daily Activity     Outcome Measure   Help from another person eating meals?: None Help from another person taking care of personal grooming?: A Little Help from another person toileting, which includes using toliet, bedpan, or urinal?: A Little Help from another person bathing (including washing, rinsing, drying)?: A Little Help from another person to put on and taking off regular upper body clothing?: A Little Help from another person to put on and taking off regular lower body clothing?: A Little 6 Click Score: 19    End of Session Equipment Utilized During Treatment: Gait belt;Rolling walker  OT Visit Diagnosis: Unsteadiness on feet (R26.81)   Activity Tolerance Patient tolerated treatment well;Patient limited by fatigue   Patient Left in chair;with call bell/phone within reach   Nurse Communication Mobility status        Time: 1420-1450 OT Time Calculation (min): 30 min  Charges: OT General Charges $OT Visit: 1 Visit OT Treatments $Self Care/Home Management : 23-37 mins  Marcy Siren, OT Pager [phone] 07/20/2017    Orlando Penner 07/20/2017, 3:33 PM

## 2017-07-20 NOTE — Progress Notes (Signed)
Have repositioned pt every 2 hours. Tolerating well.

## 2017-07-21 DIAGNOSIS — S79911A Unspecified injury of right hip, initial encounter: Secondary | ICD-10-CM | POA: Diagnosis not present

## 2017-07-21 DIAGNOSIS — I2699 Other pulmonary embolism without acute cor pulmonale: Secondary | ICD-10-CM | POA: Diagnosis not present

## 2017-07-21 DIAGNOSIS — S72002D Fracture of unspecified part of neck of left femur, subsequent encounter for closed fracture with routine healing: Secondary | ICD-10-CM | POA: Diagnosis not present

## 2017-07-21 DIAGNOSIS — J449 Chronic obstructive pulmonary disease, unspecified: Secondary | ICD-10-CM | POA: Diagnosis not present

## 2017-07-21 DIAGNOSIS — R1319 Other dysphagia: Secondary | ICD-10-CM | POA: Diagnosis not present

## 2017-07-21 DIAGNOSIS — S72002A Fracture of unspecified part of neck of left femur, initial encounter for closed fracture: Secondary | ICD-10-CM | POA: Diagnosis not present

## 2017-07-21 DIAGNOSIS — K5903 Drug induced constipation: Secondary | ICD-10-CM | POA: Diagnosis not present

## 2017-07-21 DIAGNOSIS — F419 Anxiety disorder, unspecified: Secondary | ICD-10-CM | POA: Diagnosis not present

## 2017-07-21 DIAGNOSIS — E119 Type 2 diabetes mellitus without complications: Secondary | ICD-10-CM | POA: Diagnosis not present

## 2017-07-21 DIAGNOSIS — W010XXA Fall on same level from slipping, tripping and stumbling without subsequent striking against object, initial encounter: Secondary | ICD-10-CM | POA: Diagnosis not present

## 2017-07-21 DIAGNOSIS — J441 Chronic obstructive pulmonary disease with (acute) exacerbation: Secondary | ICD-10-CM

## 2017-07-21 DIAGNOSIS — Z23 Encounter for immunization: Secondary | ICD-10-CM | POA: Diagnosis not present

## 2017-07-21 DIAGNOSIS — J9610 Chronic respiratory failure, unspecified whether with hypoxia or hypercapnia: Secondary | ICD-10-CM | POA: Diagnosis not present

## 2017-07-21 DIAGNOSIS — I252 Old myocardial infarction: Secondary | ICD-10-CM | POA: Diagnosis not present

## 2017-07-21 DIAGNOSIS — R600 Localized edema: Secondary | ICD-10-CM | POA: Diagnosis not present

## 2017-07-21 DIAGNOSIS — S72009A Fracture of unspecified part of neck of unspecified femur, initial encounter for closed fracture: Secondary | ICD-10-CM | POA: Diagnosis not present

## 2017-07-21 DIAGNOSIS — Z7901 Long term (current) use of anticoagulants: Secondary | ICD-10-CM | POA: Diagnosis not present

## 2017-07-21 DIAGNOSIS — S51012A Laceration without foreign body of left elbow, initial encounter: Secondary | ICD-10-CM | POA: Diagnosis not present

## 2017-07-21 DIAGNOSIS — Z720 Tobacco use: Secondary | ICD-10-CM | POA: Diagnosis not present

## 2017-07-21 DIAGNOSIS — R739 Hyperglycemia, unspecified: Secondary | ICD-10-CM | POA: Diagnosis not present

## 2017-07-21 DIAGNOSIS — M25552 Pain in left hip: Secondary | ICD-10-CM | POA: Diagnosis not present

## 2017-07-21 DIAGNOSIS — E118 Type 2 diabetes mellitus with unspecified complications: Secondary | ICD-10-CM | POA: Diagnosis not present

## 2017-07-21 DIAGNOSIS — I2510 Atherosclerotic heart disease of native coronary artery without angina pectoris: Secondary | ICD-10-CM | POA: Diagnosis not present

## 2017-07-21 DIAGNOSIS — J9611 Chronic respiratory failure with hypoxia: Secondary | ICD-10-CM | POA: Diagnosis not present

## 2017-07-21 DIAGNOSIS — T402X5A Adverse effect of other opioids, initial encounter: Secondary | ICD-10-CM | POA: Diagnosis not present

## 2017-07-21 DIAGNOSIS — F17200 Nicotine dependence, unspecified, uncomplicated: Secondary | ICD-10-CM | POA: Diagnosis not present

## 2017-07-21 DIAGNOSIS — R262 Difficulty in walking, not elsewhere classified: Secondary | ICD-10-CM | POA: Diagnosis not present

## 2017-07-21 DIAGNOSIS — Y92009 Unspecified place in unspecified non-institutional (private) residence as the place of occurrence of the external cause: Secondary | ICD-10-CM | POA: Diagnosis not present

## 2017-07-21 DIAGNOSIS — M6281 Muscle weakness (generalized): Secondary | ICD-10-CM | POA: Diagnosis not present

## 2017-07-21 DIAGNOSIS — S72012A Unspecified intracapsular fracture of left femur, initial encounter for closed fracture: Secondary | ICD-10-CM | POA: Diagnosis not present

## 2017-07-21 DIAGNOSIS — Z96642 Presence of left artificial hip joint: Secondary | ICD-10-CM | POA: Diagnosis not present

## 2017-07-21 LAB — BASIC METABOLIC PANEL
ANION GAP: 6 (ref 5–15)
BUN: 10 mg/dL (ref 6–20)
CHLORIDE: 100 mmol/L — AB (ref 101–111)
CO2: 27 mmol/L (ref 22–32)
CREATININE: 0.8 mg/dL (ref 0.61–1.24)
Calcium: 8.2 mg/dL — ABNORMAL LOW (ref 8.9–10.3)
GFR calc non Af Amer: >60 mL/min (ref >60)
Glucose, Bld: 111 mg/dL — ABNORMAL HIGH (ref 65–99)
POTASSIUM: 3.6 mmol/L (ref 3.5–5.1)
SODIUM: 133 mmol/L — AB (ref 135–145)

## 2017-07-21 MED ORDER — DOCUSATE SODIUM 100 MG PO CAPS: 100.0000 mg | ORAL_CAPSULE | Freq: Two times a day (BID) | ORAL | 0 refills | Status: DC

## 2017-07-21 MED ORDER — POLYETHYLENE GLYCOL 3350 17 G PO PACK: 17.0000 g | PACK | Freq: Every day | ORAL | 0 refills | Status: DC | PRN

## 2017-07-21 MED ORDER — METHOCARBAMOL 500 MG PO TABS: 500.0000 mg | ORAL_TABLET | Freq: Three times a day (TID) | ORAL | 0 refills | Status: DC | PRN

## 2017-07-21 NOTE — Progress Notes (Signed)
Physical Therapy Treatment Patient Details Name: Theodore [name] MRN: [id number] DOB: [birth year] Today's Date: 07/21/2017    History of Present Illness 61 yo male s/p fall with left femur fx s/p THA anterior approach 9/7. PMhx: COPD not on home O2, coronary artery disease, GERD and anxiety, pulmonary embolism    PT Comments    Pt performed increased supine exercises during session but presented with increased WOB post exercises and only able to stand to donn lower body dressing in prep for d/c with PTAR to SNF.  Pt with increased SHOB and requested to sit down.  Breathing tx donned post transfer.  Pt will continue to benefit from short term SNF to improve strength and endurance before returning home.  Pt intermittently on O2 post there ex.  Pt during therex on RA saturated 90%-94%.  RN informed.     Follow Up Recommendations  SNF;Supervision/Assistance - 24 hour     Equipment Recommendations  Rolling walker with 5" wheels;3in1 (PT)    Recommendations for Other Services       Precautions / Restrictions Precautions Precautions: Fall Precaution Comments: watch sats Restrictions Weight Bearing Restrictions: Yes LLE Weight Bearing: Weight bearing as tolerated    Mobility  Bed Mobility               General bed mobility comments: OOB in recliner   Transfers Overall transfer level: Needs assistance Equipment used: Rolling walker (2 wheeled) Transfers: Sit to/from Stand Sit to Stand: Min assist         General transfer comment: assist to rise and steady.  Pt with increased WOB upon standing and unable to perform gait training at this time.  Pt stood around min before requiring return to chair.    Ambulation/Gait                 Stairs            Wheelchair Mobility    Modified Rankin (Stroke Patients Only)       Balance Overall balance assessment: Needs assistance   Sitting balance-Leahy Scale: Good       Standing balance-Leahy Scale: Fair                               Cognition Arousal/Alertness: Awake/alert Behavior During Therapy: WFL for tasks assessed/performed Overall Cognitive Status: Within Functional Limits for tasks assessed                                        Exercises Total Joint Exercises Ankle Circles/Pumps: AROM;Both;10 reps;Supine Quad Sets: AROM;Both;10 reps;Supine Short Arc Quad: AROM;Left;10 reps;Supine Heel Slides: AROM;Left;10 reps;Supine Hip ABduction/ADduction: AROM;Left;10 reps;Supine Long Arc Quad: AROM;10 reps;Left;Seated    General Comments        Pertinent Vitals/Pain Pain Assessment: 0-10 Pain Score: 6  Pain Location: left hip Pain Descriptors / Indicators: Burning;Sore Pain Intervention(s): Monitored during session;Repositioned;RN gave pain meds during session;Ice applied    Home Living                      Prior Function            PT Goals (current goals can now be found in the care plan section) Acute Rehab PT Goals Patient Stated Goal: To be independent Potential to Achieve Goals: Good Progress towards PT goals: Not progressing toward  goals - comment (pt remains limited due to respiratory function.  )    Frequency    Min 5X/week      PT Plan Current plan remains appropriate    Co-evaluation              AM-PAC PT "6 Clicks" Daily Activity  Outcome Measure  Difficulty turning over in bed (including adjusting bedclothes, sheets and blankets)?: A Little Difficulty moving from lying on back to sitting on the side of the bed? : A Little Difficulty sitting down on and standing up from a chair with arms (e.g., wheelchair, bedside commode, etc,.)?: Unable Help needed moving to and from a bed to chair (including a wheelchair)?: A Little Help needed walking in hospital room?: A Little Help needed climbing 3-5 steps with a railing? : A Lot 6 Click Score: 15    End of Session Equipment Utilized During Treatment: Gait  belt Activity Tolerance: Patient limited by fatigue (unable to progress gait due to increased WOB.  ) Patient left: in chair;with call bell/phone within reach;with nursing/sitter in room Nurse Communication: Mobility status PT Visit Diagnosis: Other abnormalities of gait and mobility (R26.89);Difficulty in walking, not elsewhere classified (R26.2);History of falling (Z91.81) Pain - Right/Left: Left Pain - part of body: Hip     Time: 3244-0102 PT Time Calculation (min) (ACUTE ONLY): 21 min  Charges:  $Therapeutic Activity: 8-22 mins                    G Codes:       Joycelyn Rua, PTA pager [phone]    Florestine Avers 07/21/2017, 1:57 PM

## 2017-07-21 NOTE — Clinical Social Work Placement (Signed)
   CLINICAL SOCIAL WORK PLACEMENT  NOTE  Date:  07/21/2017  Patient Details  Name: Theodore [name] MRN: [id number] Date of Birth: 09/12/1956  Clinical Social Work is seeking post-discharge placement for this patient at the Skilled  Nursing Facility level of care (*CSW will initial, date and re-position this form in  chart as items are completed):  Yes   Patient/family provided with [facility] Clinical Social Work Department's list of facilities offering this level of care within the geographic area requested by the patient (or if unable, by the patient's family).  Yes   Patient/family informed of their freedom to choose among providers that offer the needed level of care, that participate in Medicare, Medicaid or managed care program needed by the patient, have an available bed and are willing to accept the patient.  Yes   Patient/family informed of [facility]'s ownership interest in [facility] and [facility], as well as of the fact that they are under no obligation to receive care at these facilities.  PASRR submitted to EDS on 07/17/17     PASRR number received on 07/17/17     Existing PASRR number confirmed on       FL2 transmitted to all facilities in geographic area requested by pt/family on 07/17/17     FL2 transmitted to all facilities within larger geographic area on       Patient informed that his/her managed care company has contracts with or will negotiate with certain facilities, including the following:        Yes   Patient/family informed of bed offers received.  Patient [facility] bed at  [name]     Physician recommends and patient chooses bed at      Patient to be transferred to  [name]) on 07/21/17.  Patient to be transferred to facility by PTAR     Patient family notified on 07/21/17 of transfer.  Name of family member notified:  patient responsible for self     PHYSICIAN Please sign FL2, Please prepare  priority discharge summary, including medications, Please prepare prescriptions     Additional Comment:    _______________________________________________ Tresa MoorePatricia V Harlen Danford, LCSW 07/21/2017, 12:55 PM

## 2017-07-21 NOTE — Progress Notes (Signed)
Pt ready for d/c to SNF today per MD. Report called to Jonita AlbeeKennette Berg at [facility], all questions answered. PIV removed. Pt's belongings sent with pt. Sutures in L elbow were removed per MD order. Pt will be transferred to facility via [facility].  BensonHudson, Latricia HeftKorie G

## 2017-07-21 NOTE — Discharge Summary (Signed)
Physician Discharge Summary  Theodore [name] [facility] UJW:[id number] DOB: August 24, 1956 DOA: 07/14/2017  PCP: [name], FNP  Admit date: 07/14/2017 Discharge date: 07/21/2017  Admitted From:home Disposition:SNF  Recommendations for Outpatient Follow-up:  1. Follow up with PCP in 1-2 weeks 2. Please obtain BMP/CBC in one week  Home Health:SNF Equipment/Devices:none Discharge Condition:stable CODE STATUS:full code Diet recommendation:heart healthy  Brief/Interim Summary: 61 y.o. male with history of COPD (recently taken off oxygen by his pulmonologist), CAD, GERD, anxiety, history of PE on Xarelto presented to the ED after sustaining a mechanical fall at home and landed on his left hip. Patient denies loss of consciousness, dizziness, chest pain, headache, shortness of breath, fevers, nausea, vomiting, abdominal pain, bowel or urinary symptoms. In the ED vitals were stable. Blood work showed WBC of 17.7 K, sodium of 133 and potassium of 3.4. X-ray of the hip showed comminuted subcapital left femoral neck fracture.  Patient was evaluated by orthopedics status post total hip replacement of left side. Patient clinically improved. Evaluated by PT OT. Patient will be discharged to rehabilitation facility. He will be followed up by orthopedics and PCP as an outpatient. Clinically improved.  Patient was treated for COPD with acute exacerbation with prednisone and bronchodilators. Clinically improved. He is on chronic low-dose prednisone.  Ileus improved on discharge. Patient reported to bowel movement yesterday. On bowel regimen. He has good bowel sound.  During hospitalization he was managed for essential hypertension, history of PE, diabetes, GERD.  Discharge Diagnoses:  Active Problems:   COPD exacerbation (HCC)   DM (diabetes mellitus) (HCC)   CAD (coronary artery disease)   Left displaced femoral neck fracture (HCC)   Closed displaced fracture of left femoral neck (HCC)   Chronic  respiratory failure with hypoxia [facility])    Discharge Instructions  Discharge Instructions    Call MD for:  difficulty breathing, headache or visual disturbances    Complete by:  As directed    Call MD for:  extreme fatigue    Complete by:  As directed    Call MD for:  hives    Complete by:  As directed    Call MD for:  persistant dizziness or light-headedness    Complete by:  As directed    Call MD for:  persistant nausea and vomiting    Complete by:  As directed    Call MD for:  severe uncontrolled pain    Complete by:  As directed    Call MD for:  temperature >100.4    Complete by:  As directed    Diet - low sodium heart healthy    Complete by:  As directed    Increase activity slowly    Complete by:  As directed    Weight bearing as tolerated    Complete by:  As directed      Allergies as of 07/21/2017      Reactions   Naproxen Sodium Itching      Medication List    STOP taking these medications   triamterene-hydrochlorothiazide 37.5-25 MG tablet Commonly known as:  MAXZIDE-25     TAKE these medications   albuterol (2.5 MG/3ML) 0.083% nebulizer solution Commonly known as:  PROVENTIL Take 2.5 mg by nebulization every 6 (six) hours as needed for wheezing or shortness of breath.   albuterol 108 (90 Base) MCG/ACT inhaler Commonly known as:  PROAIR HFA Inhale 2 puffs into the lungs every 6 (six) hours as needed for wheezing or shortness of breath.   docusate  sodium 100 MG capsule Commonly known as:  COLACE Take 1 capsule (100 mg total) by mouth 2 (two) times daily.   guaiFENesin 600 MG 12 hr tablet Commonly known as:  MUCINEX Take 1,200 mg by mouth 2 (two) times daily.   methocarbamol 500 MG tablet Commonly known as:  ROBAXIN Take 1 tablet (500 mg total) by mouth every 8 (eight) hours as needed for muscle spasms.   nicotine polacrilex 4 MG gum Commonly known as:  NICORETTE Take 4 mg by mouth daily as needed for smoking cessation.   oxyCODONE-acetaminophen  5-325 MG tablet Commonly known as:  PERCOCET Take 1-2 tablets by mouth every 4 (four) hours as needed for severe pain.   polyethylene glycol packet Commonly known as:  MIRALAX / GLYCOLAX Take 17 g by mouth daily as needed.   predniSONE 10 MG tablet Commonly known as:  DELTASONE Take 1 tablet (10 mg total) by mouth daily with breakfast. What changed:  Another medication with the same name was removed. Continue taking this medication, and follow the directions you see here.   rivaroxaban 20 MG Tabs tablet Commonly known as:  XARELTO Take 1 tablet (20 mg total) by mouth daily with supper.   sodium chloride 0.65 % Soln nasal spray Commonly known as:  OCEAN Place 1 spray into both nostrils 4 (four) times daily as needed for congestion.   SPIRIVA HANDIHALER 18 MCG inhalation capsule Generic drug:  tiotropium INHALE THE CONTENTS OF 1 CAPSULE VIA INHALATION DEVICE EVERY DAY   SYMBICORT 160-4.5 MCG/ACT inhaler Generic drug:  budesonide-formoterol TAKE 2 PUFFS FIRST THING IN THE MORNING AND THEN ANOTHER 2 PUFFS ABOUT 12 HOURS LATER            Discharge Care Instructions        Start     Ordered   07/21/17 0000  methocarbamol (ROBAXIN) 500 MG tablet  Every 8 hours PRN     07/21/17 1207   07/21/17 0000  Increase activity slowly     07/21/17 1207   07/21/17 0000  Diet - low sodium heart healthy     07/21/17 1207   07/21/17 0000  docusate sodium (COLACE) 100 MG capsule  2 times daily     07/21/17 1207   07/21/17 0000  polyethylene glycol (MIRALAX / GLYCOLAX) packet  Daily PRN     07/21/17 1207   07/21/17 0000  Call MD for:  temperature >100.4     07/21/17 1207   07/21/17 0000  Call MD for:  persistant nausea and vomiting     07/21/17 1207   07/21/17 0000  Call MD for:  severe uncontrolled pain     07/21/17 1207   07/21/17 0000  Call MD for:  difficulty breathing, headache or visual disturbances     07/21/17 1207   07/21/17 0000  Call MD for:  hives     07/21/17 1207    07/21/17 0000  Call MD for:  persistant dizziness or light-headedness     07/21/17 1207   07/21/17 0000  Call MD for:  extreme fatigue     07/21/17 1207   07/16/17 0000  rivaroxaban (XARELTO) 20 MG TABS tablet  Daily with supper     07/16/17 1744   07/16/17 0000  Weight bearing as tolerated     07/16/17 1744   07/16/17 0000  oxyCODONE-acetaminophen (PERCOCET) 5-325 MG tablet  Every 4 hours PRN     07/16/17 1744      Contact information for follow-up providers  Tarry KosXu, Naiping M, MD Follow up in 2 week(s).   Specialty:  Orthopedic Surgery Why:  For suture removal, For wound re-check Contact information: [address] [place] [address] [phone]        Veryl Speakalone, Gregory D, FNP. Schedule an appointment as soon as possible for a visit in 1 week(s).   Specialties:  Family Medicine, Infectious Diseases Contact information: [address] [place] [address] [phone]            Contact information for after-discharge care    Destination    [facility] Follow up.   Specialty:  Skilled Nursing Facility Contact information: [address] [place] [phone]                 Allergies  Allergen Reactions  . Naproxen Sodium Itching    Consultations: Orthopedics  Procedures/Studies: Hip surgery  Subjective: Seen and examined at bedside. Reported bowel movement and doing well. Denied headache, dizziness, nausea vomiting chest pain shortness of breath. No pain.  Discharge Exam: Vitals:   07/20/17 2058 07/21/17 0417  BP: 121/72 130/75  Pulse: 72 82  Resp: 16 16  Temp: 97.9 F (36.6 C) 98 F (36.7 C)  SpO2: 97% 98%   Vitals:   07/20/17 0503 07/20/17 1300 07/20/17 2058 07/21/17 0417  BP: 124/83 (!) 148/81 121/72 130/75  Pulse: (!) 123 72 72 82  Resp: 19 18 16 16   Temp: 98 F (36.7 C) 98 F (36.7 C) 97.9 F (36.6 C) 98 F (36.7 C)  TempSrc: Oral Oral Oral Oral  SpO2:  100% 97% 97% 98%  Weight:      Height:        General: Pt is alert, awake, not in acute distress Cardiovascular: RRR, S1/S2 +, no rubs, no gallops Respiratory: CTA bilaterally, no wheezing, no rhonchi Abdominal: Soft, NT, ND, bowel sounds + Extremities: no edema, no cyanosis    The results of significant diagnostics from this hospitalization (including imaging, microbiology, ancillary and laboratory) are listed below for reference.     Microbiology: Recent Results (from the past 240 hour(s))  Surgical PCR screen     Status: None   Collection Time: 07/15/17  6:02 PM  Result Value Ref Range Status   MRSA, PCR NEGATIVE NEGATIVE Final   Staphylococcus aureus NEGATIVE NEGATIVE Final    Comment: (NOTE) The Xpert SA Assay (FDA approved for NASAL specimens in patients 63 years of age and older), is one component of a comprehensive surveillance program. It is not intended to diagnose infection nor to guide or monitor treatment.      Labs: BNP (last 3 results) No results for input(s): BNP in the last 8760 hours. Basic Metabolic Panel:  Recent Labs Lab 07/15/17 0933 07/16/17 0445 07/17/17 0353 07/18/17 0355 07/21/17 0813  NA 130* 132* 131* 129* 133*  K 4.5 4.4 4.4 4.3 3.6  CL 96* 101 101 98* 100*  CO2 22 23 22 24 27   GLUCOSE 101* 98 148* 105* 111*  BUN 14 14 15 17 10   CREATININE 0.88 0.89 0.98 0.76 0.80  CALCIUM 8.7* 8.5* 8.3* 8.4* 8.2*   Liver Function Tests: No results for input(s): AST, ALT, ALKPHOS, BILITOT, PROT, ALBUMIN in the last 168 hours. No results for input(s): LIPASE, AMYLASE in the last 168 hours. No results for input(s): AMMONIA in the last 168 hours. CBC:  Recent Labs Lab 07/15/17 0933 07/16/17 0445 07/17/17 0353 07/18/17 0355 07/19/17 0248  WBC  15.0* 12.2* 11.9* 12.9* 10.8*  HGB 14.6 13.0 11.5* 11.5* 10.8*  HCT 43.5 39.8 35.4* 34.5* 32.9*  MCV 94.2 93.9 93.4 92.0 92.7  PLT 322 278 263 290 267   Cardiac Enzymes: No results for input(s):  CKTOTAL, CKMB, CKMBINDEX, TROPONINI in the last 168 hours. BNP: Invalid input(s): POCBNP CBG: No results for input(s): GLUCAP in the last 168 hours. D-Dimer No results for input(s): DDIMER in the last 72 hours. Hgb A1c No results for input(s): HGBA1C in the last 72 hours. Lipid Profile No results for input(s): CHOL, HDL, LDLCALC, TRIG, CHOLHDL, LDLDIRECT in the last 72 hours. Thyroid function studies No results for input(s): TSH, T4TOTAL, T3FREE, THYROIDAB in the last 72 hours.  Invalid input(s): FREET3 Anemia work up No results for input(s): VITAMINB12, FOLATE, FERRITIN, TIBC, IRON, RETICCTPCT in the last 72 hours. Urinalysis    Component Value Date/Time   COLORURINE YELLOW 05/17/2008 2303   APPEARANCEUR CLEAR 05/17/2008 2303   LABSPEC 1.024 05/17/2008 2303   PHURINE 6.5 05/17/2008 2303   GLUCOSEU NEGATIVE 05/17/2008 2303   HGBUR TRACE (A) 05/17/2008 2303   BILIRUBINUR NEGATIVE 05/17/2008 2303   KETONESUR NEGATIVE 05/17/2008 2303   PROTEINUR NEGATIVE 05/17/2008 2303   UROBILINOGEN 1.0 05/17/2008 2303   NITRITE NEGATIVE 05/17/2008 2303   LEUKOCYTESUR NEGATIVE 05/17/2008 2303   Sepsis Labs Invalid input(s): PROCALCITONIN,  WBC,  LACTICIDVEN Microbiology Recent Results (from the past 240 hour(s))  Surgical PCR screen     Status: None   Collection Time: 07/15/17  6:02 PM  Result Value Ref Range Status   MRSA, PCR NEGATIVE NEGATIVE Final   Staphylococcus aureus NEGATIVE NEGATIVE Final    Comment: (NOTE) The Xpert SA Assay (FDA approved for NASAL specimens in patients 89 years of age and older), is one component of a comprehensive surveillance program. It is not intended to diagnose infection nor to guide or monitor treatment.      Time coordinating discharge: 30 minutes  SIGNED:   Maxie Barb, MD  [facility] 07/21/2017, 12:07 PM  If 7PM-7AM, please contact night-coverage [email or web address] Password [email or web address]

## 2017-07-22 ENCOUNTER — Encounter: Payer: Self-pay | Admitting: Adult Health

## 2017-07-22 ENCOUNTER — Non-Acute Institutional Stay (SKILLED_NURSING_FACILITY): Payer: Medicare Other | Admitting: Adult Health

## 2017-07-22 DIAGNOSIS — Z96642 Presence of left artificial hip joint: Secondary | ICD-10-CM

## 2017-07-22 DIAGNOSIS — K5903 Drug induced constipation: Secondary | ICD-10-CM

## 2017-07-22 DIAGNOSIS — Z7901 Long term (current) use of anticoagulants: Secondary | ICD-10-CM | POA: Diagnosis not present

## 2017-07-22 DIAGNOSIS — T402X5A Adverse effect of other opioids, initial encounter: Secondary | ICD-10-CM

## 2017-07-22 DIAGNOSIS — S72002A Fracture of unspecified part of neck of left femur, initial encounter for closed fracture: Secondary | ICD-10-CM

## 2017-07-22 DIAGNOSIS — J9610 Chronic respiratory failure, unspecified whether with hypoxia or hypercapnia: Secondary | ICD-10-CM | POA: Diagnosis not present

## 2017-07-22 DIAGNOSIS — J449 Chronic obstructive pulmonary disease, unspecified: Secondary | ICD-10-CM

## 2017-07-22 NOTE — Progress Notes (Signed)
Location:   [facility] Room Number: 118 A Place of Service:  SNF (31)   CODE STATUS: DNR  Allergies  Allergen Reactions  . Naproxen Sodium Itching    Chief Complaint  Patient presents with  . Hospitalization Follow-up    Hospital follow up    HPI:  He has been hospitalized from 07-14-17 through 07-21-17 for a left femoral neck head fracture with a total left knee replacement. He is here for short term rehab with his goal is to return back home. He does have some shortness of breath and constipation. He tells me that his pain is being controlled with tylenol. While he is here he will be monitored for his copd; PE; CAD. There are no nursing concerns at this time.     Past Medical History:  Diagnosis Date  . Anxiety   . Arthritis    "back" (01/24/2014)  . Asthma   . Chronic bronchitis (HCC)   . Cluster headache    "last bout was summer 2014; had them q spring 1991-2001" (01/24/2014)  . COPD (chronic obstructive pulmonary disease) (HCC)   . GERD (gastroesophageal reflux disease)   . NSTEMI (non-ST elevated myocardial infarction) (HCC) 01/2014  . On home oxygen therapy    "2L; 24/7" (01/24/2014)  . Pneumonia 06/2013; 01/2014  . Pulmonary embolism (HCC) 01/23/2014  . Seasonal allergies     Past Surgical History:  Procedure Laterality Date  . COLONOSCOPY WITH PROPOFOL N/A 10/20/2016   Procedure: COLONOSCOPY WITH PROPOFOL;  Surgeon: Sherrilyn RistHenry L Danis III, MD;  Location: [facility] END[facility]COPY;  Service: Gastroenterology;  Laterality: N/A;  . NASAL SEPTOPLASTY W/ TURBINOPLASTY Left 1983  . TOTAL HIP ARTHROPLASTY Left 07/16/2017   Procedure: TOTAL HIP ARTHROPLASTY ANTERIOR APPROACH;  Surgeon: Tarry KosXu, Naiping M, MD;  Location: [facility] OR;  Service: Orthopedics;  Laterality: Left;  . TURBINATE REDUCTION Bilateral ~ 1983    Social History   Social History  . Marital status: Divorced    Spouse name: N/A  . Number of children: 0  . Years of education: 14   Occupational History  . Disability  [employer]   Social History Main Topics  . Smoking status: Former Smoker    Packs/day: 2.00    Years: 35.00    Types: Cigarettes    Quit date: 01/24/2012  . Smokeless tobacco: Never Used     Comment: Remain smoke free  . Alcohol use Yes     Comment: 01/24/2014 "holidays; family birthdays"  . Drug use: No  . Sexual activity: No   Other Topics Concern  . Not on file   Social History Narrative   [place] and moved around a lot. 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         Family History  Problem Relation Age of Onset  . Leukemia Father   . Heart disease Father   . Asthma Father   . Asthma Sister   . Emphysema Sister   . Heart disease Sister   . High blood pressure Mother   . Colon cancer Cousin        First cousin      VITAL SIGNS BP 124/71   Pulse 94   Temp 98.6 F (37 C)   Resp 18   Ht 6\' 4"  (1.93 m)   Wt 223 lb 6.4 oz (101.3 kg)   SpO2 94%   BMI 27.19 kg/m   Patient's Medications  New Prescriptions   No medications on file  Previous Medications   ALBUTEROL (PROVENTIL) (2.5 MG/3ML) 0.083% NEBULIZER SOLUTION    Take 2.5 mg by nebulization every 6 (six) hours as needed for wheezing or shortness of breath.   DOCUSATE SODIUM (COLACE) 100 MG CAPSULE    Take 1 capsule (100 mg total) by mouth 2 (two) times daily.   GUAIFENESIN (MUCINEX) 600 MG 12 HR TABLET    Take 1,200 mg by mouth 2 (two) times daily.    METHOCARBAMOL (ROBAXIN) 500 MG TABLET    Take 1 tablet (500 mg total) by mouth every 8 (eight) hours as needed for muscle spasms.   NICOTINE POLACRILEX (NICORETTE) 4 MG GUM    Take 4 mg by mouth daily as needed for smoking cessation.   OXYCODONE-ACETAMINOPHEN (PERCOCET) 5-325 MG TABLET    Take 1-2 tablets by mouth every 4 (four) hours as needed for severe pain.   POLYETHYLENE GLYCOL (MIRALAX / GLYCOLAX) PACKET    Take 17 g by mouth daily as needed.   PREDNISONE (DELTASONE) 10 MG TABLET    Take 1 tablet (10 mg total) by mouth daily  with breakfast.   RIVAROXABAN (XARELTO) 20 MG TABS TABLET    Take 1 tablet (20 mg total) by mouth daily with supper.   SODIUM CHLORIDE (OCEAN) 0.65 % SOLN NASAL SPRAY    Place 1 spray into both nostrils 4 (four) times daily as needed for congestion.   SPIRIVA HANDIHALER 18 MCG INHALATION CAPSULE    INHALE THE CONTENTS OF 1 CAPSULE VIA INHALATION DEVICE EVERY DAY   SYMBICORT 160-4.5 MCG/ACT INHALER    TAKE 2 PUFFS FIRST THING IN THE MORNING AND THEN ANOTHER 2 PUFFS ABOUT 12 HOURS LATER  Modified Medications   No medications on file  Discontinued Medications   ALBUTEROL (PROAIR HFA) 108 (90 BASE) MCG/ACT INHALER    Inhale 2 puffs into the lungs every 6 (six) hours as needed for wheezing or shortness of breath.     SIGNIFICANT DIAGNOSTIC EXAMS  TODAY:   04-12-17: 2-d echo:  - Left ventricle: The cavity size was normal. Wall thickness was normal. Systolic function was vigorous. The estimated ejection fraction was in the range of 65% to 70%. Wall motion was normal; there were no regional wall motion abnormalities. - Pulmonary arteries: Systolic pressure was mildly increased. PA peak pressure: 37 mm Hg (S).   07-14-17: pelvic left hip x-ray: Comminuted subcapital left femoral neck fracture as detailed.  07-14-17: chest x-ray: Mild upper lobe scarring. No edema or consolidation.  07-16-17 pelvic x-ray: Interval left hip replacement with normal alignment. Expected postsurgical changes.   07-17-17: Negative chest x-ray.  LABS REVIEWED: TODAY:   07-14-17: wbc 17.7; hgb 15.3; hct 42.9; mcv 89.7; plt 364; glucose 99; bun 12; creat 0.99; k+ 3.4; na++ 133; ca 9.0 07-16-17: wbc 12.2; hgb 13.0; hct 39.8; mcv 93.9; plt 278; glucose 98; bun 14; creat 0.89; k+ 4.4 ;na++ 132; ca 8.5 07-19-17: wbc 10.9; hgb 10.8; hct 32.9 mcv 92.7; plt 267; glucose 111; bun 10; creat 0.8; k+ 3.6; na++ 133; ca 8.2  Review of Systems  Constitutional: Negative for malaise/fatigue.  Respiratory: Positive for cough and shortness of  breath.   Cardiovascular: Negative for chest pain, palpitations and leg swelling.  Gastrointestinal: Positive for constipation. Negative for abdominal pain and heartburn.  Musculoskeletal: Negative for back pain, joint pain and myalgias.       Tylenol is managing pain   Skin: Negative.   Neurological: Negative for dizziness.  Psychiatric/Behavioral: The patient is not nervous/anxious.  Physical Exam  Constitutional: He is oriented to person, place, and time. He appears well-developed and well-nourished. No distress.  Eyes: Conjunctivae are normal.  Neck: Normal range of motion. Neck supple. No thyromegaly present.  Cardiovascular: Normal rate, regular rhythm, normal heart sounds and intact distal pulses.   Pulmonary/Chest: No respiratory distress.  Has increased effort Lung sounds diminished   Abdominal: Soft. Bowel sounds are normal. He exhibits no distension. There is no tenderness.  Musculoskeletal:  No edema Can move all extremities is status post left femoral head fracture.   Lymphadenopathy:    He has no cervical adenopathy.  Neurological: He is alert and oriented to person, place, and time.  Skin: Skin is warm and dry. He is not diaphoretic.  Incision line without signs of infection present   Psychiatric: He has a normal mood and affect.  Vitals reviewed.   ASSESSMENT/ PLAN:  TODAY:   1. PE: ( on right) is stable is on chronic anticoagulation will continue xarelto 20 mg nightly and will monitor his status.   2. COPD GOLD IV: has chronic respiratory failure with hypoxia: is without change in status: will continue symbicort 160-4.5 mcg 2 puffs twice daily; spiriva 18 mcg daily prednisone 10 mg mucenix 1200 mg twice daily   Will change his albuterol neb to every 6 hours routinely for one week then return back to prn status. I have encouraged him to use his I/S and flutter valve  3. Closed displaced left femoral neck: is stable will continue tylenol as needed for pain;  has robaxin 500 mg every 8 hours as needed; and has percocet 5/325 mg 1 or 2 tabs every 4 hours as needed; will continue therapy as directed; will follow up with orthopedics as directed and will monitor his status.   4. Constipation: worse: will continue colace twice daily and will change miralax to 17 gm daily  Will check cbc; bmp   MD is aware of resident's narcotic use and is in agreement with current plan of care. We will attempt to wean resident as apropriate   Synthia Innocent NP [facility]  Contact [phone] Monday through Friday 8am- 5pm  After hours call [phone]

## 2017-07-26 ENCOUNTER — Encounter: Payer: Self-pay | Admitting: Internal Medicine

## 2017-07-26 ENCOUNTER — Non-Acute Institutional Stay (SKILLED_NURSING_FACILITY): Payer: Medicare Other | Admitting: Internal Medicine

## 2017-07-26 DIAGNOSIS — R739 Hyperglycemia, unspecified: Secondary | ICD-10-CM

## 2017-07-26 DIAGNOSIS — Z96642 Presence of left artificial hip joint: Secondary | ICD-10-CM | POA: Diagnosis not present

## 2017-07-26 DIAGNOSIS — I2699 Other pulmonary embolism without acute cor pulmonale: Secondary | ICD-10-CM | POA: Diagnosis not present

## 2017-07-26 DIAGNOSIS — Z720 Tobacco use: Secondary | ICD-10-CM | POA: Diagnosis not present

## 2017-07-26 DIAGNOSIS — I252 Old myocardial infarction: Secondary | ICD-10-CM | POA: Diagnosis not present

## 2017-07-26 DIAGNOSIS — J449 Chronic obstructive pulmonary disease, unspecified: Secondary | ICD-10-CM | POA: Diagnosis not present

## 2017-07-26 NOTE — Progress Notes (Signed)
Patient ID: Theodore [name], male   DOB: 1955/11/17, 61 y.o.   MRN: [id number]     HISTORY AND PHYSICAL   DATE:   July 26, 2017  Location:   [facility] Room Number: 118 A Place of Service: SNF (31)   Extended Emergency Contact Information Primary Emergency Contact: Conley Rolls, [place] of [place] Phone: [phone] Work Phone: [phone] Mobile Phone: [phone] Relation: Sister Secondary Emergency Contact: [place] of [place] Mobile Phone: [phone] Relation: Sister Mother: Odysse[place], [place] of [place] Phone: [phone]  Advanced Directive information Does Patient Have a Medical Advance Directive?: Yes, Would patient like information on creating a medical advance directive?: No - Patient declined, Type of Advance Directive: Out of facility DNR (pink MOST or yellow form), Pre-existing out of facility DNR order (yellow form or pink MOST form): Yellow form placed in chart (order not valid for inpatient use), Does patient want to make changes to medical advance directive?: No - Patient declined  Chief Complaint  Patient presents with  . New Admit To SNF    Admission    HPI:  61 yo male seen today as a new admission into SNF following hospital stay for left displaced femoral neck fx s/p mechanical fall at home, COPD exacerbation with hx chronic bronchitis, DM, CAD, chronic respiratory failure with hypoxia. He presented to the ED from home with left hip pain after tripping over floor fan at home and falling. Xray left hip revealed comminuted subcapital left femoral neck fx. He was taken to the OR by Ortho and underwent left THA by Dr Erlinda Hong. Post op complicated by COPD exacerbation that was tx with prednisone and bronchodilators. maxzide stopped due to low Na. Glucose 98-148; Na dropped 129-->133; K 3.4-->3.6 at d/c. He presents to SNF for short term rehab.   He reports pain well  controlled with Tylenol and he has not needed prn percocet x 4 days at least. He would like stool softeners stopped as his stools are too soft. Appetite is good. Sleep is interrupted due to pain in left hip. Tolerating tx. (+) cough but now productive. No nursing issues. No falls since admission into SNF.  COPD/chronic bronchitis/hx asthma/seasonal allergies - improving. Takes symbicort, spiriva, mucinex 1290m BID, prn albuterol neb and HFA. He is on prednisone 15mdaily long term. He has not needed [place] O2. Uses saline nasal spray prn due to nasal congestion. Followed by pulmonary Dr McLake BellsHe is still smoking cigs but now has nicorette gum  Arthritis - pain stable on prn tylenol  Hx PE/CAD/hx NSTEMI - stable on long term xeralto  Hyperglycemia - A1c 5.8% in 2017. CBG 80-90s.  Past Medical History:  Diagnosis Date  . Anxiety   . Arthritis    "back" (01/24/2014)  . Asthma   . Chronic bronchitis ([facility]  . Cluster headache    "last bout was summer 2014; had them q spring 1991-2001" (01/24/2014)  . COPD (chronic obstructive pulmonary disease) ([facility]  . GERD (gastroesophageal reflux disease)   . NSTEMI (non-ST elevated myocardial infarction) ([facility]  . On home oxygen therapy    "2L; 24/7" (01/24/2014)  . Pneumonia 06/2013; 01/2014  . Pulmonary embolism ([facility]17/2015  . Seasonal allergies     Past Surgical History:  Procedure Laterality Date  . COLONOSCOPY WITH PROPOFOL N/A 10/20/2016   Procedure: COLONOSCOPY WITH PROPOFOL;  Surgeon: HeDoran StablerMD;  [facility];  Service: Gastroenterology;  Laterality: N/A;  . NASAL SEPTOPLASTY W/ TURBINOPLASTY Left 1983  . TOTAL HIP ARTHROPLASTY Left 07/16/2017   Procedure: TOTAL HIP ARTHROPLASTY ANTERIOR APPROACH;  Surgeon: Leandrew Koyanagi, MD;  Location: [facility];  Service: Orthopedics;  Laterality: Left;  . TURBINATE REDUCTION Bilateral ~ 1983    Patient Care Team: [place], FNP as PCP - General (Family  Medicine) Wardell Honour, MD as Referring Physician (Family Medicine)  Social History   Social History  . Marital status: Divorced    Spouse name: N/A  . Number of children: 0  . Years of education: 14   Occupational History  . [employer]   Social History Main Topics  . Smoking status: Former Smoker    Packs/day: 2.00    Years: 35.00    Types: Cigarettes    Quit date: 01/24/2012  . Smokeless tobacco: Never Used     Comment: Remain smoke free  . Alcohol use Yes     Comment: 01/24/2014 "holidays; family birthdays"  . Drug use: No  . Sexual activity: No   Other Topics Concern  . Not on file   Social History Narrative   [place] in [place] and moved around a lot. Dad was an Chief Financial Officer and moved frequently.           reports that he quit smoking about 5 years ago. His smoking use included Cigarettes. He has a 70.00 pack-year smoking history. He has never used smokeless tobacco. He reports that he drinks alcohol. He reports that he does not use drugs.  Family History  Problem Relation Age of Onset  . Leukemia Father   . Heart disease Father   . Asthma Father   . Asthma Sister   . Emphysema Sister   . Heart disease Sister   . High blood pressure Mother   . Colon cancer Cousin        First cousin   Family Status  Relation Status  . Father (Not Specified)  . Sister (Not Specified)  . Sister (Not Specified)  . Mother Alive  . Cousin (Not Specified)    Immunization History  Administered Date(s) Administered  . Influenza Split 08/10/2011, 07/27/2012  . Influenza,inj,Quad PF,6+ Mos 07/14/2013, 08/06/2014, 10/17/2015, 07/21/2016  . PPD Test 07/22/2017  . Pneumococcal Conjugate-13 08/06/2014  . Pneumococcal Polysaccharide-23 02/05/2012  . Tdap 09/03/2014, 07/14/2017    Allergies  Allergen Reactions  . Naproxen Sodium Itching    Medications: Patient's Medications  New Prescriptions   No medications on file  Previous Medications    ALBUTEROL (PROVENTIL) (2.5 MG/3ML) 0.083% NEBULIZER SOLUTION    Take 2.5 mg by nebulization every 6 (six) hours as needed for wheezing or shortness of breath.   DOCUSATE SODIUM (COLACE) 100 MG CAPSULE    Take 1 capsule (100 mg total) by mouth 2 (two) times daily.   GUAIFENESIN (MUCINEX) 600 MG 12 HR TABLET    Take 1,200 mg by mouth 2 (two) times daily.    METHOCARBAMOL (ROBAXIN) 500 MG TABLET    Take 1 tablet (500 mg total) by mouth every 8 (eight) hours as needed for muscle spasms.   NICOTINE POLACRILEX (NICORETTE) 4 MG GUM    Take 4 mg by mouth daily as needed for smoking cessation.   OXYCODONE-ACETAMINOPHEN (PERCOCET) 5-325 MG TABLET    Take 1-2 tablets by mouth every 4 (four) hours as needed for severe pain.   OXYGEN    Inhale 2 L/min  into the lungs as needed. For sats less than 90%   POLYETHYLENE GLYCOL (MIRALAX / GLYCOLAX) PACKET    Take 17 g by mouth daily. Hold for constipation   PREDNISONE (DELTASONE) 10 MG TABLET    Take 1 tablet (10 mg total) by mouth daily with breakfast.   RIVAROXABAN (XARELTO) 20 MG TABS TABLET    Take 1 tablet (20 mg total) by mouth daily with supper.   SODIUM CHLORIDE (OCEAN) 0.65 % SOLN NASAL SPRAY    Place 1 spray into both nostrils 4 (four) times daily as needed for congestion.   SPIRIVA HANDIHALER 18 MCG INHALATION CAPSULE    INHALE THE CONTENTS OF 1 CAPSULE VIA INHALATION DEVICE EVERY DAY   SYMBICORT 160-4.5 MCG/ACT INHALER    TAKE 2 PUFFS FIRST THING IN THE MORNING AND THEN ANOTHER 2 PUFFS ABOUT 12 HOURS LATER  Modified Medications   No medications on file  Discontinued Medications   POLYETHYLENE GLYCOL (MIRALAX / GLYCOLAX) PACKET    Take 17 g by mouth daily as needed.    Review of Systems  Constitutional: Positive for fatigue.  HENT: Positive for congestion.   Respiratory: Positive for chest tightness, shortness of breath and wheezing.   Cardiovascular: Negative for chest pain.  Gastrointestinal: Negative for constipation.  Musculoskeletal: Positive  for arthralgias and gait problem.  Psychiatric/Behavioral: Positive for sleep disturbance.  All other systems reviewed and are negative.   Vitals:   07/26/17 0933  BP: 105/76  Pulse: 88  Resp: 18  Temp: 98.9 F (37.2 C)  SpO2: 97%  Weight: 223 lb 6.4 oz (101.3 kg)  Height: 6' 4" (1.93 m)   Body mass index is 27.19 kg/m.  Physical Exam  Constitutional: He is oriented to person, place, and time. He appears well-developed and well-nourished.  Sitting in w/c at bedside with minimum conversational dyspnea. Flutter device and incentive spirometry on tray table  HENT:  Mouth/Throat: Oropharynx is clear and moist.  MMM; no oral thrush  Eyes: Pupils are equal, round, and reactive to light. No scleral icterus.  Neck: Neck supple. Carotid bruit is not present. No thyromegaly present.  Cardiovascular: Normal rate, regular rhythm and intact distal pulses.  Exam reveals no gallop and no friction rub.   Murmur (1/6 SEM) heard. Trace L>RLE swelling. No calf TTP  Pulmonary/Chest: Effort normal. He has wheezes (L>R basilar end expiratory with prolonged expiratory phase). He has no rales. He exhibits no tenderness.  Abdominal: Soft. Normal appearance and bowel sounds are normal. He exhibits distension. He exhibits no abdominal bruit, no pulsatile midline mass and no mass. There is no hepatomegaly. There is no tenderness. There is no rigidity, no rebound and no guarding. No hernia.  Musculoskeletal: He exhibits edema and tenderness.  Left thigh dsg c/d/i with some distal swelling  Lymphadenopathy:    He has no cervical adenopathy.  Neurological: He is alert and oriented to person, place, and time.  Skin: Skin is warm and dry. No rash noted.  Psychiatric: He has a normal mood and affect. His behavior is normal. Judgment and thought content normal.     Labs reviewed: Admission on 07/14/2017, Discharged on 07/21/2017  Component Date Value Ref Range Status  . WBC 07/14/2017 17.7* 4.0 - 10.5 K/uL  Final  . RBC 07/14/2017 4.78  4.22 - 5.81 MIL/uL Final  . Hemoglobin 07/14/2017 15.3  13.0 - 17.0 g/dL Final  . HCT 07/14/2017 42.9  39.0 - 52.0 % Final  . MCV 07/14/2017 89.7  78.0 - 100.0  fL Final  . MCH 07/14/2017 32.0  26.0 - 34.0 pg Final  . MCHC 07/14/2017 35.7  30.0 - 36.0 g/dL Final  . RDW 07/14/2017 13.6  11.5 - 15.5 % Final  . Platelets 07/14/2017 364  150 - 400 K/uL Final  . Neutrophils Relative % 07/14/2017 85  % Final  . Neutro Abs 07/14/2017 15.2* 1.7 - 7.7 K/uL Final  . Lymphocytes Relative 07/14/2017 10  % Final  . Lymphs Abs 07/14/2017 1.8  0.7 - 4.0 K/uL Final  . Monocytes Relative 07/14/2017 4  % Final  . Monocytes Absolute 07/14/2017 0.6  0.1 - 1.0 K/uL Final  . Eosinophils Relative 07/14/2017 1  % Final  . Eosinophils Absolute 07/14/2017 0.1  0.0 - 0.7 K/uL Final  . Basophils Relative 07/14/2017 0  % Final  . Basophils Absolute 07/14/2017 0.0  0.0 - 0.1 K/uL Final  . Sodium 07/14/2017 133* 135 - 145 mmol/L Final  . Potassium 07/14/2017 3.4* 3.5 - 5.1 mmol/L Final  . Chloride 07/14/2017 94* 101 - 111 mmol/L Final  . CO2 07/14/2017 30  22 - 32 mmol/L Final  . Glucose, Bld 07/14/2017 99  65 - 99 mg/dL Final  . BUN 07/14/2017 12  6 - 20 mg/dL Final  . Creatinine, Ser 07/14/2017 0.99  0.61 - 1.24 mg/dL Final  . Calcium 07/14/2017 9.0  8.9 - 10.3 mg/dL Final  . GFR calc non Af Amer 07/14/2017 >60  >60 mL/min Final  . GFR calc Af Amer 07/14/2017 >60  >60 mL/min Final   Comment: (NOTE) The eGFR has been calculated using the CKD EPI equation. This calculation has not been validated in all clinical situations. eGFR's persistently <60 mL/min signify possible Chronic Kidney Disease.   . Anion gap 07/14/2017 9  5 - 15 Final  . HIV Screen 4th Generation wRfx 07/14/2017 Non Reactive  Non Reactive Final   Comment: (NOTE) Performed At: [facility] [place], [place] [id number] Lindon Romp MD [id number]   . Troponin I 07/14/2017 <0.03  <0.03  ng/mL Final  . WBC 07/15/2017 15.0* 4.0 - 10.5 K/uL Final  . RBC 07/15/2017 4.62  4.22 - 5.81 MIL/uL Final  . Hemoglobin 07/15/2017 14.6  13.0 - 17.0 g/dL Final  . HCT 07/15/2017 43.5  39.0 - 52.0 % Final  . MCV 07/15/2017 94.2  78.0 - 100.0 fL Final  . MCH 07/15/2017 31.6  26.0 - 34.0 pg Final  . MCHC 07/15/2017 33.6  30.0 - 36.0 g/dL Final  . RDW 07/15/2017 14.2  11.5 - 15.5 % Final  . Platelets 07/15/2017 322  150 - 400 K/uL Final  . Sodium 07/15/2017 130* 135 - 145 mmol/L Final  . Potassium 07/15/2017 4.5  3.5 - 5.1 mmol/L Final   Comment: DELTA CHECK NOTED SLIGHT HEMOLYSIS   . Chloride 07/15/2017 96* 101 - 111 mmol/L Final  . CO2 07/15/2017 22  22 - 32 mmol/L Final  . Glucose, Bld 07/15/2017 101* 65 - 99 mg/dL Final  . BUN 07/15/2017 14  6 - 20 mg/dL Final  . Creatinine, Ser 07/15/2017 0.88  0.61 - 1.24 mg/dL Final  . Calcium 07/15/2017 8.7* 8.9 - 10.3 mg/dL Final  . GFR calc non Af Amer 07/15/2017 >60  >60 mL/min Final  . GFR calc Af Amer 07/15/2017 >60  >60 mL/min Final   Comment: (NOTE) The eGFR has been calculated using the CKD EPI equation. This calculation has not been validated in all clinical situations. eGFR's persistently <60 mL/min  signify possible Chronic Kidney Disease.   . Anion gap 07/15/2017 12  5 - 15 Final  . Prothrombin Time 07/15/2017 15.2  11.4 - 15.2 seconds Final  . INR 07/15/2017 1.21   Final  . aPTT 07/15/2017 31  24 - 36 seconds Final  . WBC 07/16/2017 12.2* 4.0 - 10.5 K/uL Final  . RBC 07/16/2017 4.24  4.22 - 5.81 MIL/uL Final  . Hemoglobin 07/16/2017 13.0  13.0 - 17.0 g/dL Final  . HCT 07/16/2017 39.8  39.0 - 52.0 % Final  . MCV 07/16/2017 93.9  78.0 - 100.0 fL Final  . MCH 07/16/2017 30.7  26.0 - 34.0 pg Final  . MCHC 07/16/2017 32.7  30.0 - 36.0 g/dL Final  . RDW 07/16/2017 14.2  11.5 - 15.5 % Final  . Platelets 07/16/2017 278  150 - 400 K/uL Final  . Sodium 07/16/2017 132* 135 - 145 mmol/L Final  . Potassium 07/16/2017 4.4  3.5 - 5.1  mmol/L Final  . Chloride 07/16/2017 101  101 - 111 mmol/L Final  . CO2 07/16/2017 23  22 - 32 mmol/L Final  . Glucose, Bld 07/16/2017 98  65 - 99 mg/dL Final  . BUN 07/16/2017 14  6 - 20 mg/dL Final  . Creatinine, Ser 07/16/2017 0.89  0.61 - 1.24 mg/dL Final  . Calcium 07/16/2017 8.5* 8.9 - 10.3 mg/dL Final  . GFR calc non Af Amer 07/16/2017 >60  >60 mL/min Final  . GFR calc Af Amer 07/16/2017 >60  >60 mL/min Final   Comment: (NOTE) The eGFR has been calculated using the CKD EPI equation. This calculation has not been validated in all clinical situations. eGFR's persistently <60 mL/min signify possible Chronic Kidney Disease.   . Anion gap 07/16/2017 8  5 - 15 Final  . MRSA, PCR 07/15/2017 NEGATIVE  NEGATIVE Final  . Staphylococcus aureus 07/15/2017 NEGATIVE  NEGATIVE Final   Comment: (NOTE) The Xpert SA Assay (FDA approved for NASAL specimens in patients 77 years of age and older), is one component of a comprehensive surveillance program. It is not intended to diagnose infection nor to guide or monitor treatment.   . Prothrombin Time 07/16/2017 14.7  11.4 - 15.2 seconds Final  . INR 07/16/2017 1.15   Final  . WBC 07/17/2017 11.9* 4.0 - 10.5 K/uL Final  . RBC 07/17/2017 3.79* 4.22 - 5.81 MIL/uL Final  . Hemoglobin 07/17/2017 11.5* 13.0 - 17.0 g/dL Final  . HCT 07/17/2017 35.4* 39.0 - 52.0 % Final  . MCV 07/17/2017 93.4  78.0 - 100.0 fL Final  . MCH 07/17/2017 30.3  26.0 - 34.0 pg Final  . MCHC 07/17/2017 32.5  30.0 - 36.0 g/dL Final  . RDW 07/17/2017 14.0  11.5 - 15.5 % Final  . Platelets 07/17/2017 263  150 - 400 K/uL Final  . Sodium 07/17/2017 131* 135 - 145 mmol/L Final  . Potassium 07/17/2017 4.4  3.5 - 5.1 mmol/L Final  . Chloride 07/17/2017 101  101 - 111 mmol/L Final  . CO2 07/17/2017 22  22 - 32 mmol/L Final  . Glucose, Bld 07/17/2017 148* 65 - 99 mg/dL Final  . BUN 07/17/2017 15  6 - 20 mg/dL Final  . Creatinine, Ser 07/17/2017 0.98  0.61 - 1.24 mg/dL Final  .  Calcium 07/17/2017 8.3* 8.9 - 10.3 mg/dL Final  . GFR calc non Af Amer 07/17/2017 >60  >60 mL/min Final  . GFR calc Af Amer 07/17/2017 >60  >60 mL/min Final   Comment: (NOTE) The eGFR  has been calculated using the CKD EPI equation. This calculation has not been validated in all clinical situations. eGFR's persistently <60 mL/min signify possible Chronic Kidney Disease.   . Anion gap 07/17/2017 8  5 - 15 Final  . WBC 07/18/2017 12.9* 4.0 - 10.5 K/uL Final  . RBC 07/18/2017 3.75* 4.22 - 5.81 MIL/uL Final  . Hemoglobin 07/18/2017 11.5* 13.0 - 17.0 g/dL Final  . HCT 07/18/2017 34.5* 39.0 - 52.0 % Final  . MCV 07/18/2017 92.0  78.0 - 100.0 fL Final  . MCH 07/18/2017 30.7  26.0 - 34.0 pg Final  . MCHC 07/18/2017 33.3  30.0 - 36.0 g/dL Final  . RDW 07/18/2017 13.9  11.5 - 15.5 % Final  . Platelets 07/18/2017 290  150 - 400 K/uL Final  . Sodium 07/18/2017 129* 135 - 145 mmol/L Final  . Potassium 07/18/2017 4.3  3.5 - 5.1 mmol/L Final  . Chloride 07/18/2017 98* 101 - 111 mmol/L Final  . CO2 07/18/2017 24  22 - 32 mmol/L Final  . Glucose, Bld 07/18/2017 105* 65 - 99 mg/dL Final  . BUN 07/18/2017 17  6 - 20 mg/dL Final  . Creatinine, Ser 07/18/2017 0.76  0.61 - 1.24 mg/dL Final  . Calcium 07/18/2017 8.4* 8.9 - 10.3 mg/dL Final  . GFR calc non Af Amer 07/18/2017 >60  >60 mL/min Final  . GFR calc Af Amer 07/18/2017 >60  >60 mL/min Final   Comment: (NOTE) The eGFR has been calculated using the CKD EPI equation. This calculation has not been validated in all clinical situations. eGFR's persistently <60 mL/min signify possible Chronic Kidney Disease.   . Anion gap 07/18/2017 7  5 - 15 Final  . WBC 07/19/2017 10.8* 4.0 - 10.5 K/uL Final  . RBC 07/19/2017 3.55* 4.22 - 5.81 MIL/uL Final  . Hemoglobin 07/19/2017 10.8* 13.0 - 17.0 g/dL Final  . HCT 07/19/2017 32.9* 39.0 - 52.0 % Final  . MCV 07/19/2017 92.7  78.0 - 100.0 fL Final  . MCH 07/19/2017 30.4  26.0 - 34.0 pg Final  . MCHC 07/19/2017  32.8  30.0 - 36.0 g/dL Final  . RDW 07/19/2017 14.1  11.5 - 15.5 % Final  . Platelets 07/19/2017 267  150 - 400 K/uL Final  . Sodium 07/21/2017 133* 135 - 145 mmol/L Final  . Potassium 07/21/2017 3.6  3.5 - 5.1 mmol/L Final  . Chloride 07/21/2017 100* 101 - 111 mmol/L Final  . CO2 07/21/2017 27  22 - 32 mmol/L Final  . Glucose, Bld 07/21/2017 111* 65 - 99 mg/dL Final  . BUN 07/21/2017 10  6 - 20 mg/dL Final  . Creatinine, Ser 07/21/2017 0.80  0.61 - 1.24 mg/dL Final  . Calcium 07/21/2017 8.2* 8.9 - 10.3 mg/dL Final  . GFR calc non Af Amer 07/21/2017 >60  >60 mL/min Final  . GFR calc Af Amer 07/21/2017 >60  >60 mL/min Final   Comment: (NOTE) The eGFR has been calculated using the CKD EPI equation. This calculation has not been validated in all clinical situations. eGFR's persistently <60 mL/min signify possible Chronic Kidney Disease.   . Anion gap 07/21/2017 6  5 - 15 Final    Dg Chest 1 View  Result Date: 07/14/2017 CLINICAL DATA:  Pain following fall.  Cough. EXAM: CHEST 1 VIEW COMPARISON:  December 28, 2016 FINDINGS: There is slight upper lobe scarring bilaterally. There is no edema or consolidation. Heart size and pulmonary vascularity are normal. No adenopathy. No pneumothorax. No acute fracture evident. IMPRESSION:  Mild upper lobe scarring.  No edema or consolidation. Electronically Signed   By: Lowella Grip III M.D.   On: 07/14/2017 10:59   Dg Abd 1 View  Result Date: 07/18/2017 CLINICAL DATA:  Acute abdominal distension. EXAM: ABDOMEN - 1 VIEW COMPARISON:  Radiographs of May 17, 2008. FINDINGS: Status post left hip arthroplasty. No small bowel dilatation is noted. Mildly dilated air-filled transverse colon is noted which may represent ileus. Moderate amount of stool is seen in the right and proximal transverse colon. IMPRESSION: Moderate stool burden is noted. Mildly dilated air-filled transverse colon is noted which may represent ileus. No small bowel dilatation is noted.  Electronically Signed   By: Marijo Conception, M.D.   On: 07/18/2017 11:40   Pelvis Portable  Result Date: 07/16/2017 CLINICAL DATA:  Postop left hip arthroplasty EXAM: PORTABLE PELVIS 1-2 VIEWS COMPARISON:  07/14/2017 FINDINGS: Interval left hip replacement with normal alignment. Pubic symphysis is intact. Vascular calcifications. Moderate degenerative changes of the right hip. Cutaneous staples left hip. Moderate soft tissue gas. IMPRESSION: Interval left hip replacement with normal alignment. Expected postsurgical changes. Electronically Signed   By: Donavan Foil M.D.   On: 07/16/2017 19:53   Dg Chest Port 1 View  Result Date: 07/17/2017 CLINICAL DATA:  61 year old male with decreased breath sounds EXAM: PORTABLE CHEST 1 VIEW COMPARISON:  Prior chest x-ray 07/14/2017 FINDINGS: The lungs are clear and negative for focal airspace consolidation, pulmonary edema or suspicious pulmonary nodule. No pleural effusion or pneumothorax. Cardiac and mediastinal contours are within normal limits. No acute fracture or lytic or blastic osseous lesions. The visualized upper abdominal bowel gas pattern is unremarkable. IMPRESSION: Negative chest x-ray. Electronically Signed   By: Jacqulynn Cadet M.D.   On: 07/17/2017 10:48   Dg C-arm 1-60 Min  Result Date: 07/16/2017 CLINICAL DATA:  Left total hip arthroplasty. EXAM: OPERATIVE LEFT HIP WITH PELVIS; DG C-ARM 61-120 MIN COMPARISON:  Left hip radiographs 07/14/2017 FLUOROSCOPY TIME:  C-arm fluoroscopic images were obtained intraoperatively and submitted for post operative interpretation. Please see the performing provider's procedural report for the fluoroscopy time utilized. FINDINGS: Two intraoperative spot fluoroscopic images are provided. A left total hip arthroplasty has been performed. The prosthetic components appear normally located on these limited images. IMPRESSION: Intraoperative images during left hip arthroplasty. Electronically Signed   By: Logan Bores M.D.    On: 07/16/2017 18:05   Dg Hip Operative Unilat W Or W/o Pelvis Left  Result Date: 07/16/2017 CLINICAL DATA:  Left total hip arthroplasty. EXAM: OPERATIVE LEFT HIP WITH PELVIS; DG C-ARM 61-120 MIN COMPARISON:  Left hip radiographs 07/14/2017 FLUOROSCOPY TIME:  C-arm fluoroscopic images were obtained intraoperatively and submitted for post operative interpretation. Please see the performing provider's procedural report for the fluoroscopy time utilized. FINDINGS: Two intraoperative spot fluoroscopic images are provided. A left total hip arthroplasty has been performed. The prosthetic components appear normally located on these limited images. IMPRESSION: Intraoperative images during left hip arthroplasty. Electronically Signed   By: Logan Bores M.D.   On: 07/16/2017 18:05   Dg Hip Unilat With Pelvis 2-3 Views Left  Result Date: 07/14/2017 CLINICAL DATA:  Fall.  Left hip pain. EXAM: DG HIP (WITH OR WITHOUT PELVIS) 2-3V LEFT COMPARISON:  None. FINDINGS: There is a comminuted subcapital left femoral neck fracture with mild 4 mm lateral displacement of the dominant distal fracture fragment, with mild impaction and with minimal apex anterior/ lateral angulation. No additional fracture. No left hip dislocation. No suspicious focal osseous lesions. No  pelvic diastasis. Vascular calcifications throughout the soft tissues. No radiopaque foreign body. IMPRESSION: Comminuted subcapital left femoral neck fracture as detailed. Electronically Signed   By: Ilona Sorrel M.D.   On: 07/14/2017 10:54     Assessment/Plan   ICD-10-CM   1. Status post total replacement of left hip Z96.642   2. COPD GOLD IV  J44.9   3. Hyperglycemia R73.9   4. Recurrent pulmonary emboli (HCC) I26.99   5. History of MI (myocardial infarction) I25.2    NSTEMI  6. Continuous tobacco abuse Z72.0    Cont chronic prednisone tx for COPD  Cont using flutter device and incentive spirometry as directed  D/c percocet as pt no longer  needs  D/c colace due to soft stools and pt no longer on percocet  Cont other meds as ordered  CBC and BMP pending. Check A1c  F/u with pulm as scheduled  F/u with Ortho as scheduled  Wound care as ordered  PT/OT as ordered  GOAL: short term rehab and d/c home when medically appropriate. Communicated with pt and nursing.  Iden Stripling S. Perlie Gold  [facility] [address] [place], [place] [address] [phone] Cell (Monday-Friday 8 AM - 5 PM) [phone] After 5 PM and follow prompts

## 2017-07-26 NOTE — Progress Notes (Deleted)
Error

## 2017-07-27 ENCOUNTER — Other Ambulatory Visit: Payer: Self-pay | Admitting: Pulmonary Disease

## 2017-07-27 LAB — HEMOGLOBIN A1C: Hemoglobin A1C: 5.2

## 2017-07-28 LAB — BASIC METABOLIC PANEL
BUN: 10 (ref 4–21)
Creatinine: 0.8 (ref 0.6–1.3)
Glucose: 83
POTASSIUM: 4.3 (ref 3.4–5.3)
SODIUM: 136 — AB (ref 137–147)

## 2017-07-28 LAB — CBC AND DIFFERENTIAL
HEMATOCRIT: 34 — AB (ref 41–53)
HEMOGLOBIN: 11.4 — AB (ref 13.5–17.5)
NEUTROS ABS: 8
Platelets: 490 — AB (ref 150–399)
WBC: 11.5

## 2017-07-29 ENCOUNTER — Encounter: Payer: Self-pay | Admitting: Adult Health

## 2017-07-29 ENCOUNTER — Non-Acute Institutional Stay (SKILLED_NURSING_FACILITY): Payer: Medicare Other | Admitting: Adult Health

## 2017-07-29 DIAGNOSIS — R600 Localized edema: Secondary | ICD-10-CM | POA: Diagnosis not present

## 2017-07-29 DIAGNOSIS — J449 Chronic obstructive pulmonary disease, unspecified: Secondary | ICD-10-CM | POA: Diagnosis not present

## 2017-07-29 DIAGNOSIS — J9610 Chronic respiratory failure, unspecified whether with hypoxia or hypercapnia: Secondary | ICD-10-CM

## 2017-07-29 NOTE — Progress Notes (Signed)
Location:   [facility] Room Number: 118 A Place of Service:  SNF ([id number])   CODE STATUS: DNR  Allergies  Allergen Reactions  . Naproxen Sodium Itching    Chief Complaint  Patient presents with  . Acute Visit    COPD    HPI:  Therapy is reporting that he is less tolerant to activity; that he is easily short of breath. He continues to struggle with lower extremity edema. He tells me that he is short of breath denies any cough.  He is using flutter valve daily. I have discussed his status with therapy and he would benefit from chest percussion. There are no reports of chest pain present.   Past Medical History:  Diagnosis Date  . Anxiety   . Arthritis    "back" (01/24/2014)  . Asthma   . Chronic bronchitis (HCC)   . Cluster headache    "last bout was summer 2014; had them q spring 1991-2001" (01/24/2014)  . COPD (chronic obstructive pulmonary disease) (HCC)   . GERD (gastroesophageal reflux disease)   . NSTEMI (non-ST elevated myocardial infarction) (HCC) 01/2014  . On home oxygen therapy    "2L; 24/7" (01/24/2014)  . Pneumonia 06/2013; 01/2014  . Pulmonary embolism (HCC) 01/23/2014  . Seasonal allergies     Past Surgical History:  Procedure Laterality Date  . COLONOSCOPY WITH PROPOFOL N/A 10/20/2016   Procedure: COLONOSCOPY WITH PROPOFOL;  Surgeon: Sherrilyn Rist, MD;  Location: [facility];  Service: Gastroenterology;  Laterality: N/A;  . NASAL SEPTOPLASTY W/ TURBINOPLASTY Left 1983  . TOTAL HIP ARTHROPLASTY Left 07/16/2017   Procedure: TOTAL HIP ARTHROPLASTY ANTERIOR APPROACH;  Surgeon: Tarry Kos, MD;  Location: [facility] OR;  Service: Orthopedics;  Laterality: Left;  . TURBINATE REDUCTION Bilateral ~ 1983    Social History   Social History  . Marital status: Divorced    Spouse name: N/A  . Number of children: 0  . Years of education: 14   Occupational History  . Disability [employer]   Social History Main Topics  . Smoking status: Former  Smoker    Packs/day: 2.00    Years: 35.00    Types: Cigarettes    Quit date: 01/24/2012  . Smokeless tobacco: Never Used     Comment: Remain smoke free  . Alcohol use Yes     Comment: 01/24/2014 "holidays; family birthdays"  . Drug use: No  . Sexual activity: No   Other Topics Concern  . Not on file   Social History Narrative   Born in [place], [place] and moved around a lot. Dad was an Art gallery manager and moved frequently.         Family History  Problem Relation Age of Onset  . Leukemia Father   . Heart disease Father   . Asthma Father   . Asthma Sister   . Emphysema Sister   . Heart disease Sister   . High blood pressure Mother   . Colon cancer Cousin        First cousin      VITAL SIGNS BP 105/76   Pulse 88   Temp 98.9 F (37.2 C)   Resp 18   Ht  (1.93 m)   Wt 223 lb 6.4 oz (101.3 kg)   SpO2 97%   BMI 27.19 kg/m   Patient's Medications  New Prescriptions   No medications on file  Previous Medications   ALBUTEROL (PROVENTIL) (2.5 MG/3ML) 0.083% NEBULIZER SOLUTION    Take  2.5 mg by nebulization every 6 (six) hours as needed for wheezing or shortness of breath.   GUAIFENESIN (MUCINEX) 600 MG 12 HR TABLET    Take 1,200 mg by mouth 2 (two) times daily.    METHOCARBAMOL (ROBAXIN) 500 MG TABLET    Take 1 tablet (500 mg total) by mouth every 8 (eight) hours as needed for muscle spasms.   NICOTINE POLACRILEX (NICORETTE) 4 MG GUM    Take 4 mg by mouth daily as needed for smoking cessation.   OXYGEN    Inhale 2 L/min into the lungs as needed. For sats less than 90%   POLYETHYLENE GLYCOL (MIRALAX / GLYCOLAX) PACKET    Take 17 g by mouth daily. Hold for constipation   PREDNISONE (DELTASONE) 10 MG TABLET    Take 1 tablet (10 mg total) by mouth daily with breakfast.   RIVAROXABAN (XARELTO) 20 MG TABS TABLET    Take 1 tablet (20 mg total) by mouth daily with supper.   SODIUM CHLORIDE (OCEAN) 0.65 % SOLN NASAL SPRAY    Place 1 spray into both nostrils 4 (four) times daily as  needed for congestion.   SPIRIVA HANDIHALER 18 MCG INHALATION CAPSULE    INHALE THE CONTENTS OF 1 CAPSULE VIA INHALATION DEVICE EVERY DAY   SYMBICORT 160-4.5 MCG/ACT INHALER    TAKE 2 PUFFS FIRST THING IN THE MORNING AND THEN ANOTHER 2 PUFFS ABOUT 12 HOURS LATER  Modified Medications   No medications on file  Discontinued Medications   DOCUSATE SODIUM (COLACE) 100 MG CAPSULE    Take 1 capsule (100 mg total) by mouth 2 (two) times daily.   OXYCODONE-ACETAMINOPHEN (PERCOCET) 5-325 MG TABLET    Take 1-2 tablets by mouth every 4 (four) hours as needed for severe pain.     SIGNIFICANT DIAGNOSTIC EXAMS  PREVIOUS:   04-12-17: 2-d echo:  - Left ventricle: The cavity size was normal. Wall thickness was normal. Systolic function was vigorous. The estimated ejection fraction was in the range of 65% to 70%. Wall motion was normal; there were no regional wall motion abnormalities. - Pulmonary arteries: Systolic pressure was mildly increased. PA peak pressure: 37 mm Hg (S).   07-14-17: pelvic left hip x-ray: Comminuted subcapital left femoral neck fracture as detailed.  07-14-17: chest x-ray: Mild upper lobe scarring. No edema or consolidation.  07-16-17 pelvic x-ray: Interval left hip replacement with normal alignment. Expected postsurgical changes.   07-17-17: Negative chest x-ray.  NO NEW EXAMS   LABS REVIEWED: PREVIOUS:   07-14-17: wbc 17.7; hgb 15.3; hct 42.9; mcv 89.7; plt 364; glucose 99; bun 12; creat 0.99; k+ 3.4; na++ 133; ca 9.0 07-16-17: wbc 12.2; hgb 13.0; hct 39.8; mcv 93.9; plt 278; glucose 98; bun 14; creat 0.89; k+ 4.4 ;na++ 132; ca 8.5 07-19-17: wbc 10.9; hgb 10.8; hct 32.9 mcv 92.7; plt 267; glucose 111; bun 10; creat 0.8; k+ 3.6; na++ 133; ca 8.2  TODAY:   07-17-17: hgb a1c 5.2    Review of Systems  Constitutional: Negative for malaise/fatigue.  Respiratory: Positive for cough and shortness of breath.   Cardiovascular: Negative for chest pain, palpitations and leg swelling.    Gastrointestinal: Negative for abdominal pain, constipation and heartburn.  Musculoskeletal: Negative for back pain, joint pain and myalgias.  Skin: Negative.   Neurological: Negative for dizziness.  Psychiatric/Behavioral: The patient is not nervous/anxious.    Physical Exam  Constitutional: He is oriented to person, place, and time. No distress.  Eyes: Conjunctivae are normal.  Neck:  Normal range of motion. Neck supple.  Cardiovascular: Normal rate, regular rhythm, normal heart sounds and intact distal pulses.   Pulmonary/Chest: Effort normal and breath sounds normal. No respiratory distress.  Abdominal: Soft. Bowel sounds are normal. He exhibits no distension. There is no tenderness.  Musculoskeletal: He exhibits edema.  Can move all extremities is status post left femoral head fracture.   Has 3+ lower extremity edema   Lymphadenopathy:    He has no cervical adenopathy.  Neurological: He is alert and oriented to person, place, and time.  Skin: Skin is warm and dry. He is not diaphoretic.    ASSESSMENT/ PLAN:  TODAY:   1. COPD GOLD IV: has chronic respiratory failure with hypoxia: is without change in status: will continue symbicort 160-4.5 mcg 2 puffs twice daily; spiriva 18 mcg daily prednisone 10 mg mucenix 1200 mg twice daily   Will begin chest percussion daily; with flutter valve  2. Bilateral lower extremity edema: is worse will begin demadex 10 mg daily with k+ 10 meq daily will check bmp in one week     MD is aware of resident's narcotic use and is in agreement with current plan of care. We will attempt to wean resident as apropriate   Synthia Innocent NP [facility]  Contact [phone] Monday through Friday 8am- 5pm  After hours call [phone]

## 2017-07-30 ENCOUNTER — Ambulatory Visit: Payer: Self-pay | Admitting: Pulmonary Disease

## 2017-07-30 NOTE — Progress Notes (Deleted)
Subjective:    Patient ID: Theodore [name], male    DOB: November 03, 1956, 61 y.o.   MRN: [id number]  Synopsis: Has stage IV COPD (steroid dependent) and history of a pulmonary embolism after a hospitalization in 2015 formerly followed by Dr. Sherene Sires.  - Smoked 3 packs a day at the most, decreased to 1 ppd for a "few years", he quit altogether since 2015 -second lifetime pulmomary embolism in January 2018   HPI No chief complaint on file.   ***  Past Medical History:  Diagnosis Date  . Anxiety   . Arthritis    "back" (01/24/2014)  . Asthma   . Chronic bronchitis (HCC)   . Cluster headache    "last bout was summer 2014; had them q spring 1991-2001" (01/24/2014)  . COPD (chronic obstructive pulmonary disease) (HCC)   . GERD (gastroesophageal reflux disease)   . NSTEMI (non-ST elevated myocardial infarction) (HCC) 01/2014  . On home oxygen therapy    "2L; 24/7" (01/24/2014)  . Pneumonia 06/2013; 01/2014  . Pulmonary embolism (HCC) 01/23/2014  . Seasonal allergies       Review of Systems  Constitutional: Positive for fatigue. Negative for chills and fever.  HENT: Negative for postnasal drip, rhinorrhea and sinus pressure.   Respiratory: Positive for chest tightness, shortness of breath and wheezing.   Cardiovascular: Positive for leg swelling. Negative for chest pain and palpitations.       Objective:   Physical Exam  There were no vitals filed for this visit. RA    ***   PFT - PFT's 12/02/2011  FEV1  0.94 (24%) ratio 39 -  18% response to B 2 and DLCO 60%  Labs:  -Alpha 1 genotype sent 03/01/12 >  MM  -07/14/2013 Med calendar , 10/24/2013   Echo: January 2018 echocardiogram reviewed showing a normal LVEF but mild pulmonary hypertension     Assessment & Plan:   No diagnosis found.  Discussion: ***    Current Outpatient Prescriptions:  .  albuterol (PROVENTIL) (2.5 MG/3ML) 0.083% nebulizer solution, Take 2.5 mg by nebulization every 6 (six) hours as needed for  wheezing or shortness of breath., Disp: , Rfl:  .  guaiFENesin (MUCINEX) 600 MG 12 hr tablet, Take 1,200 mg by mouth 2 (two) times daily. , Disp: , Rfl:  .  methocarbamol (ROBAXIN) 500 MG tablet, Take 1 tablet (500 mg total) by mouth every 8 (eight) hours as needed for muscle spasms., Disp: 20 tablet, Rfl: 0 .  nicotine polacrilex (NICORETTE) 4 MG gum, Take 4 mg by mouth daily as needed for smoking cessation., Disp: , Rfl:  .  OXYGEN, Inhale 2 L/min into the lungs as needed. For sats less than 90%, Disp: , Rfl:  .  polyethylene glycol (MIRALAX / GLYCOLAX) packet, Take 17 g by mouth daily. Hold for constipation, Disp: , Rfl:  .  predniSONE (DELTASONE) 10 MG tablet, Take 1 tablet (10 mg total) by mouth daily with breakfast., Disp: 30 tablet, Rfl: 2 .  rivaroxaban (XARELTO) 20 MG TABS tablet, Take 1 tablet (20 mg total) by mouth daily with supper., Disp: 30 tablet, Rfl: 0 .  sodium chloride (OCEAN) 0.65 % SOLN nasal spray, Place 1 spray into both nostrils 4 (four) times daily as needed for congestion., Disp: , Rfl:  .  SPIRIVA HANDIHALER 18 MCG inhalation capsule, INHALE THE CONTENTS OF 1 CAPSULE VIA INHALATION DEVICE EVERY DAY, Disp: 30 capsule, Rfl: 5 .  SYMBICORT 160-4.5 MCG/ACT inhaler, TAKE 2 PUFFS FIRST THING IN  THE MORNING AND THEN ANOTHER 2 PUFFS ABOUT 12 HOURS LATER, Disp: 10.2 g, Rfl: 5

## 2017-08-05 ENCOUNTER — Ambulatory Visit (INDEPENDENT_AMBULATORY_CARE_PROVIDER_SITE_OTHER): Payer: No Typology Code available for payment source

## 2017-08-05 ENCOUNTER — Encounter (INDEPENDENT_AMBULATORY_CARE_PROVIDER_SITE_OTHER): Payer: Self-pay | Admitting: Orthopaedic Surgery

## 2017-08-05 ENCOUNTER — Ambulatory Visit (INDEPENDENT_AMBULATORY_CARE_PROVIDER_SITE_OTHER): Payer: Medicare Other | Admitting: Orthopaedic Surgery

## 2017-08-05 DIAGNOSIS — M25552 Pain in left hip: Secondary | ICD-10-CM

## 2017-08-05 DIAGNOSIS — S72002A Fracture of unspecified part of neck of left femur, initial encounter for closed fracture: Secondary | ICD-10-CM

## 2017-08-05 NOTE — Progress Notes (Addendum)
Patient is almost 3 weeks status post left total hip replacement for femoral neck fracture. He is working with physical therapy. He is currently living at a skilled nursing facility. He denies any pain. His COPD he has limited his progress.  Surgical incision is fully healed. No signs of infection. Leg lengths are equal. No pain with rotation of the hips.  X-ray show stable total hip replacement in good alignment.  Staples were removed today. Continue to progress with physical therapy for strengthening. Follow-up in 4 weeks for recheck.

## 2017-08-06 DIAGNOSIS — T402X5A Adverse effect of other opioids, initial encounter: Secondary | ICD-10-CM

## 2017-08-06 DIAGNOSIS — K5903 Drug induced constipation: Secondary | ICD-10-CM | POA: Insufficient documentation

## 2017-08-07 LAB — BASIC METABOLIC PANEL
BUN: 12 (ref 4–21)
Creatinine: 0.8 (ref 0.6–1.3)
GLUCOSE: 85
Potassium: 4.5 (ref 3.4–5.3)
Sodium: 140 (ref 137–147)

## 2017-08-17 ENCOUNTER — Encounter: Payer: Self-pay | Admitting: Adult Health

## 2017-08-17 ENCOUNTER — Non-Acute Institutional Stay (SKILLED_NURSING_FACILITY): Payer: Medicare Other | Admitting: Adult Health

## 2017-08-17 DIAGNOSIS — S72002A Fracture of unspecified part of neck of left femur, initial encounter for closed fracture: Secondary | ICD-10-CM | POA: Diagnosis not present

## 2017-08-17 DIAGNOSIS — Z96642 Presence of left artificial hip joint: Secondary | ICD-10-CM

## 2017-08-17 DIAGNOSIS — J449 Chronic obstructive pulmonary disease, unspecified: Secondary | ICD-10-CM

## 2017-08-17 NOTE — Progress Notes (Signed)
Location:   [facility] Room Number: 118 A Place of Service:  SNF (31)    CODE STATUS: DNR  Allergies  Allergen Reactions  . Naproxen Sodium Itching    Chief Complaint  Patient presents with  . Discharge Note    Discharging to Home    HPI:  He is being discharged to home with home health for rn/pt/ot. He will need his prescriptions written and will need to follow up with his medical provider. He will not need any dme; he has all necessary dme.  He had been hospitalized for a left femoral neck fracture status post arthroplasty. He was admitted to this facility for short term rehab; and is now ready for discharge to home.    Past Medical History:  Diagnosis Date  . Anxiety   . Arthritis    "back" (01/24/2014)  . Asthma   . Chronic bronchitis (HCC)   . Cluster headache    "last bout was summer 2014; had them q spring 1991-2001" (01/24/2014)  . COPD (chronic obstructive pulmonary disease) (HCC)   . GERD (gastroesophageal reflux disease)   . NSTEMI (non-ST elevated myocardial infarction) (HCC) 01/2014  . On home oxygen therapy    "2L; 24/7" (01/24/2014)  . Pneumonia 06/2013; 01/2014  . Pulmonary embolism (HCC) 01/23/2014  . Seasonal allergies     Past Surgical History:  Procedure Laterality Date  . COLONOSCOPY WITH PROPOFOL N/A 10/20/2016   Procedure: COLONOSCOPY WITH PROPOFOL;  Surgeon: Sherrilyn Rist, MD;  Location: [facility];  Service: Gastroenterology;  Laterality: N/A;  . NASAL SEPTOPLASTY W/ TURBINOPLASTY Left 1983  . TOTAL HIP ARTHROPLASTY Left 07/16/2017   Procedure: TOTAL HIP ARTHROPLASTY ANTERIOR APPROACH;  Surgeon: Tarry Kos, MD;  Location: [facility] OR;  Service: Orthopedics;  Laterality: Left;  . TURBINATE REDUCTION Bilateral ~ 1983    Social History   Social History  . Marital status: Divorced    Spouse name: N/A  . Number of children: 0  . Years of education: 14   Occupational History  . Disability [employer]   Social  History Main Topics  . Smoking status: Former Smoker    Packs/day: 2.00    Years: 35.00    Types: Cigarettes    Quit date: 01/24/2012  . Smokeless tobacco: Never Used     Comment: Remain smoke free  . Alcohol use Yes     Comment: 01/24/2014 "holidays; family birthdays"  . Drug use: No  . Sexual activity: No   Other Topics Concern  . Not on file   Social History Narrative   Born in [place], [place] and moved around a lot. Dad was an Art gallery manager and moved frequently.         Family History  Problem Relation Age of Onset  . Leukemia Father   . Heart disease Father   . Asthma Father   . Asthma Sister   . Emphysema Sister   . Heart disease Sister   . High blood pressure Mother   . Colon cancer Cousin        First cousin    VITAL SIGNS BP 120/73   Pulse 91   Temp 97.8 F (36.6 C)   Resp 20   Ht  (1.93 m)   Wt 223 lb 6.4 oz (101.3 kg)   SpO2 90%   BMI 27.19 kg/m   Patient's Medications  New Prescriptions   No medications on file  Previous Medications   ALBUTEROL (PROVENTIL) (2.5 MG/3ML) 0.083%  NEBULIZER SOLUTION    Take 2.5 mg by nebulization every 6 (six) hours as needed for wheezing or shortness of breath.   GUAIFENESIN (MUCINEX) 600 MG 12 HR TABLET    Take 1,200 mg by mouth 2 (two) times daily.    METHOCARBAMOL (ROBAXIN) 500 MG TABLET    Take 1 tablet (500 mg total) by mouth every 8 (eight) hours as needed for muscle spasms.   NICOTINE POLACRILEX (NICORETTE) 4 MG GUM    Take 4 mg by mouth daily as needed for smoking cessation.   OXYGEN    Inhale 2 L/min into the lungs as needed. For sats less than 90%   POLYETHYLENE GLYCOL (MIRALAX / GLYCOLAX) PACKET    Take 17 g by mouth daily. Hold for constipation   POTASSIUM CHLORIDE (K-DUR) 10 MEQ TABLET    Take 10 mEq by mouth daily.   PREDNISONE (DELTASONE) 10 MG TABLET    Take 1 tablet (10 mg total) by mouth daily with breakfast.   RIVAROXABAN (XARELTO) 20 MG TABS TABLET    Take 1 tablet (20 mg total) by mouth daily with  supper.   SODIUM CHLORIDE (OCEAN) 0.65 % SOLN NASAL SPRAY    Place 1 spray into both nostrils 4 (four) times daily as needed for congestion.   SPIRIVA HANDIHALER 18 MCG INHALATION CAPSULE    INHALE THE CONTENTS OF 1 CAPSULE VIA INHALATION DEVICE EVERY DAY   SYMBICORT 160-4.5 MCG/ACT INHALER    TAKE 2 PUFFS FIRST THING IN THE MORNING AND THEN ANOTHER 2 PUFFS ABOUT 12 HOURS LATER   TORSEMIDE (DEMADEX) 10 MG TABLET    Take 10 mg by mouth every morning.  Modified Medications   No medications on file  Discontinued Medications   No medications on file     SIGNIFICANT DIAGNOSTIC EXAMS  PREVIOUS:   04-12-17: 2-d echo:  - Left ventricle: The cavity size was normal. Wall thickness was normal. Systolic function was vigorous. The estimated ejection fraction was in the range of 65% to 70%. Wall motion was normal; there were no regional wall motion abnormalities. - Pulmonary arteries: Systolic pressure was mildly increased. PA peak pressure: 37 mm Hg (S).   07-14-17: pelvic left hip x-ray: Comminuted subcapital left femoral neck fracture as detailed.  07-14-17: chest x-ray: Mild upper lobe scarring. No edema or consolidation.  07-16-17 pelvic x-ray: Interval left hip replacement with normal alignment. Expected postsurgical changes.   07-17-17: Negative chest x-ray.  NO NEW EXAMS   LABS REVIEWED: PREVIOUS:   07-14-17: wbc 17.7; hgb 15.3; hct 42.9; mcv 89.7; plt 364; glucose 99; bun 12; creat 0.99; k+ 3.4; na++ 133; ca 9.0 07-16-17: wbc 12.2; hgb 13.0; hct 39.8; mcv 93.9; plt 278; glucose 98; bun 14; creat 0.89; k+ 4.4 ;na++ 132; ca 8.5 07-19-17: wbc 10.9; hgb 10.8; hct 32.9 mcv 92.7; plt 267; glucose 111; bun 10; creat 0.8; k+ 3.6; na++ 133; ca 8.2 07-17-17: hgb a1c 5.2  NO NEW LABS     Review of Systems  Constitutional: Negative for malaise/fatigue.  Respiratory: Negative for cough and shortness of breath.   Cardiovascular: Negative for chest pain, palpitations and leg swelling.  Gastrointestinal:  Negative for abdominal pain, constipation and heartburn.  Musculoskeletal: Negative for back pain, joint pain and myalgias.  Skin: Negative.   Neurological: Negative for dizziness.  Psychiatric/Behavioral: The patient is not nervous/anxious.    Physical Exam  Constitutional: He is oriented to person, place, and time. He appears well-developed and well-nourished. No distress.  Eyes: Conjunctivae  are normal.  Neck: Normal range of motion. Neck supple. No thyromegaly present.  Cardiovascular: Normal rate, regular rhythm, normal heart sounds and intact distal pulses.   Pulmonary/Chest: Effort normal and breath sounds normal. No respiratory distress.  Abdominal: Soft. Bowel sounds are normal. He exhibits no distension. There is no tenderness.  Musculoskeletal: He exhibits edema.  Is able to move all extremities; is status post left femoral neck fracture Has 2 + lower extremity edema   Lymphadenopathy:    He has no cervical adenopathy.  Neurological: He is alert and oriented to person, place, and time.  Skin: Skin is warm and dry. He is not diaphoretic.  Psychiatric: He has a normal mood and affect.   ASSESSMENT/ PLAN:  Patient is being discharged with the following home health services:  Pt/ot/rn: to evaluate and treat as indicated for gait; balance; strength adl training and medication management   Patient is being discharged with the following durable medical equipment:  None required   Patient has been advised to f/u with their PCP in 1-2 weeks to bring them up to date on their rehab stay.  Social services at facility was responsible for arranging this appointment.  Pt was provided with a 30 day supply of prescriptions for medications and refills must be obtained from their PCP.  For controlled substances, a more limited supply may be provided adequate until PCP appointment only.  A 30 day supply of his prescriptions have been written per the list as above.  He does no have any narcotics  .   Time spent with patient 40 minutes: for discharge process; to discuss his medications; his need for any dme; home health services and follow up with medical provider. He did veralize understanding.    Synthia Innocent NP [facility]  Contact [phone] Monday through Friday 8am- 5pm  After hours call [phone]

## 2017-08-18 ENCOUNTER — Other Ambulatory Visit: Payer: Self-pay | Admitting: Pulmonary Disease

## 2017-08-21 DIAGNOSIS — S72012D Unspecified intracapsular fracture of left femur, subsequent encounter for closed fracture with routine healing: Secondary | ICD-10-CM | POA: Diagnosis not present

## 2017-08-21 DIAGNOSIS — I10 Essential (primary) hypertension: Secondary | ICD-10-CM | POA: Diagnosis not present

## 2017-08-21 DIAGNOSIS — J449 Chronic obstructive pulmonary disease, unspecified: Secondary | ICD-10-CM | POA: Diagnosis not present

## 2017-08-21 DIAGNOSIS — E119 Type 2 diabetes mellitus without complications: Secondary | ICD-10-CM | POA: Diagnosis not present

## 2017-08-21 DIAGNOSIS — I2510 Atherosclerotic heart disease of native coronary artery without angina pectoris: Secondary | ICD-10-CM | POA: Diagnosis not present

## 2017-08-21 DIAGNOSIS — J9611 Chronic respiratory failure with hypoxia: Secondary | ICD-10-CM | POA: Diagnosis not present

## 2017-08-23 ENCOUNTER — Telehealth: Payer: Self-pay | Admitting: Family

## 2017-08-23 DIAGNOSIS — E119 Type 2 diabetes mellitus without complications: Secondary | ICD-10-CM | POA: Diagnosis not present

## 2017-08-23 DIAGNOSIS — I10 Essential (primary) hypertension: Secondary | ICD-10-CM | POA: Diagnosis not present

## 2017-08-23 DIAGNOSIS — I2510 Atherosclerotic heart disease of native coronary artery without angina pectoris: Secondary | ICD-10-CM | POA: Diagnosis not present

## 2017-08-23 DIAGNOSIS — J449 Chronic obstructive pulmonary disease, unspecified: Secondary | ICD-10-CM | POA: Diagnosis not present

## 2017-08-23 DIAGNOSIS — S72012D Unspecified intracapsular fracture of left femur, subsequent encounter for closed fracture with routine healing: Secondary | ICD-10-CM | POA: Diagnosis not present

## 2017-08-23 DIAGNOSIS — J9611 Chronic respiratory failure with hypoxia: Secondary | ICD-10-CM | POA: Diagnosis not present

## 2017-08-23 NOTE — Telephone Encounter (Signed)
Requesting verbal order for nursing for 2 week 3, 1 week 5, and 2 PRN.  COPD teaching.

## 2017-08-23 NOTE — Telephone Encounter (Signed)
Pt is schedule to see Ashleigh this Thursday, and call back w/orders once she see the patient...Raechel Chute

## 2017-08-24 DIAGNOSIS — I2510 Atherosclerotic heart disease of native coronary artery without angina pectoris: Secondary | ICD-10-CM | POA: Diagnosis not present

## 2017-08-24 DIAGNOSIS — I10 Essential (primary) hypertension: Secondary | ICD-10-CM | POA: Diagnosis not present

## 2017-08-24 DIAGNOSIS — J449 Chronic obstructive pulmonary disease, unspecified: Secondary | ICD-10-CM | POA: Diagnosis not present

## 2017-08-24 DIAGNOSIS — S72012D Unspecified intracapsular fracture of left femur, subsequent encounter for closed fracture with routine healing: Secondary | ICD-10-CM | POA: Diagnosis not present

## 2017-08-24 DIAGNOSIS — J9611 Chronic respiratory failure with hypoxia: Secondary | ICD-10-CM | POA: Diagnosis not present

## 2017-08-24 DIAGNOSIS — E119 Type 2 diabetes mellitus without complications: Secondary | ICD-10-CM | POA: Diagnosis not present

## 2017-08-25 DIAGNOSIS — J9611 Chronic respiratory failure with hypoxia: Secondary | ICD-10-CM | POA: Diagnosis not present

## 2017-08-25 DIAGNOSIS — E119 Type 2 diabetes mellitus without complications: Secondary | ICD-10-CM | POA: Diagnosis not present

## 2017-08-25 DIAGNOSIS — S72012D Unspecified intracapsular fracture of left femur, subsequent encounter for closed fracture with routine healing: Secondary | ICD-10-CM | POA: Diagnosis not present

## 2017-08-25 DIAGNOSIS — I2510 Atherosclerotic heart disease of native coronary artery without angina pectoris: Secondary | ICD-10-CM | POA: Diagnosis not present

## 2017-08-25 DIAGNOSIS — J449 Chronic obstructive pulmonary disease, unspecified: Secondary | ICD-10-CM | POA: Diagnosis not present

## 2017-08-25 DIAGNOSIS — I10 Essential (primary) hypertension: Secondary | ICD-10-CM | POA: Diagnosis not present

## 2017-08-26 ENCOUNTER — Telehealth: Payer: Self-pay | Admitting: Nurse Practitioner

## 2017-08-26 ENCOUNTER — Ambulatory Visit (INDEPENDENT_AMBULATORY_CARE_PROVIDER_SITE_OTHER): Payer: Medicare Other | Admitting: Nurse Practitioner

## 2017-08-26 ENCOUNTER — Encounter: Payer: Self-pay | Admitting: Nurse Practitioner

## 2017-08-26 VITALS — BP 112/76 | HR 96 | Temp 99.0°F | Resp 20 | Ht 76.0 in | Wt 207.0 lb

## 2017-08-26 DIAGNOSIS — Z96642 Presence of left artificial hip joint: Secondary | ICD-10-CM

## 2017-08-26 DIAGNOSIS — Z23 Encounter for immunization: Secondary | ICD-10-CM

## 2017-08-26 DIAGNOSIS — J449 Chronic obstructive pulmonary disease, unspecified: Secondary | ICD-10-CM | POA: Diagnosis not present

## 2017-08-26 NOTE — Telephone Encounter (Signed)
LVM giving verbal orders per Ashleigh.  

## 2017-08-26 NOTE — Progress Notes (Signed)
Subjective:    Patient ID: Theodore [name], male    DOB: October 09, 1956, 61 y.o.   MRN: [id number]  HPI Theodore [name] is a 61yo male Who presents today to establish care. He is transferring to me from another provider in the same clinic.  Status post total replacement of Left hip - Left hip replacement on 9/07 post hip fx. He's been doing well. He was released from OT and awaiting PT in home. Home nurse comes to check on him. Pain is minimal. He hasnt had any falls.He does reports some lower extremity swelling bilaterally. Does not need to take anything for pain. He is walking with cane now. Requesting handicap placard. His mother is currently staying with him to help after his hip surgery.  I reviewd recent hospitalization and surgical records.         COPD- He reports chronic shortness of breath on exertion.No fevers, chest pain, productive cough. He checks his pulse oximetry at home. He notices his oxygen saturation decreases to about 92% on room air when ambulating. His oxygen saturation increases after he rests. He uses his albuterol about twice daily. He uses symbicort, spiriva daily as prescribed. He usually spaces about 10 hours between his albuterol puffs. He also takes prednisone 10mg  daily at a maintenance dose for his copd. He was following pulmonology until his hip surgery and hes not made an appointment with them since. I reviewed recent diagnostic test results.  Review of Systems See HPI  Past Medical History:  Diagnosis Date  . Anxiety   . Arthritis    "back" (01/24/2014)  . Asthma   . Chronic bronchitis (HCC)   . Cluster headache    "last bout was summer 2014; had them q spring 1991-2001" (01/24/2014)  . COPD (chronic obstructive pulmonary disease) (HCC)   . GERD (gastroesophageal reflux disease)   . NSTEMI (non-ST elevated myocardial infarction) (HCC) 01/2014  . On home oxygen therapy    "2L; 24/7" (01/24/2014)  . Pneumonia 06/2013; 01/2014  . Pulmonary embolism (HCC) 01/23/2014   . Seasonal allergies      Social History   Social History  . Marital status: Divorced    Spouse name: N/A  . Number of children: 0  . Years of education: 14   Occupational History  . Disability [employer]   Social History Main Topics  . Smoking status: Former Smoker    Packs/day: 2.00    Years: 35.00    Types: Cigarettes    Quit date: 01/24/2012  . Smokeless tobacco: Never Used     Comment: Remain smoke free  . Alcohol use Yes     Comment: 01/24/2014 "holidays; family birthdays"  . Drug use: No  . Sexual activity: No   Other Topics Concern  . Not on file   Social History Narrative   Born in [place], [place] and moved around a lot. Dad was an Art gallery manager and moved frequently.          Past Surgical History:  Procedure Laterality Date  . COLONOSCOPY WITH PROPOFOL N/A 10/20/2016   Procedure: COLONOSCOPY WITH PROPOFOL;  Surgeon: Sherrilyn Rist, MD;  Location: [facility];  Service: Gastroenterology;  Laterality: N/A;  . NASAL SEPTOPLASTY W/ TURBINOPLASTY Left 1983  . TOTAL HIP ARTHROPLASTY Left 07/16/2017   Procedure: TOTAL HIP ARTHROPLASTY ANTERIOR APPROACH;  Surgeon: Tarry Kos, MD;  Location: [facility];  Service: Orthopedics;  Laterality: Left;  . TURBINATE REDUCTION Bilateral ~ 1983    Family History  Problem Relation Age of Onset  . Leukemia Father   . Heart disease Father   . Asthma Father   . Asthma Sister   . Emphysema Sister   . Heart disease Sister   . High blood pressure Mother   . Colon cancer Cousin        First cousin    Allergies  Allergen Reactions  . Naproxen Sodium Itching    Current Outpatient Prescriptions on File Prior to Visit  Medication Sig Dispense Refill  . albuterol (PROVENTIL) (2.5 MG/3ML) 0.083% nebulizer solution Take 2.5 mg by nebulization every 6 (six) hours as needed for wheezing or shortness of breath.    . guaiFENesin (MUCINEX) 600 MG 12 hr tablet Take 1,200 mg by mouth 2 (two) times daily.     .  methocarbamol (ROBAXIN) 500 MG tablet Take 1 tablet (500 mg total) by mouth every 8 (eight) hours as needed for muscle spasms. 20 tablet 0  . nicotine polacrilex (NICORETTE) 4 MG gum Take 4 mg by mouth daily as needed for smoking cessation.    . OXYGEN Inhale 2 L/min into the lungs as needed. For sats less than 90%    . polyethylene glycol (MIRALAX / GLYCOLAX) packet Take 17 g by mouth daily. Hold for constipation    . potassium chloride (K-DUR) 10 MEQ tablet TAKE 1 TABLET BY MOUTH EVERY DAY 90 tablet 1  . predniSONE (DELTASONE) 10 MG tablet Take 1 tablet (10 mg total) by mouth daily with breakfast. 30 tablet 2  . rivaroxaban (XARELTO) 20 MG TABS tablet Take 1 tablet (20 mg total) by mouth daily with supper. 30 tablet 0  . sodium chloride (OCEAN) 0.65 % SOLN nasal spray Place 1 spray into both nostrils 4 (four) times daily as needed for congestion.    Marland Kitchen. SPIRIVA HANDIHALER 18 MCG inhalation capsule INHALE THE CONTENTS OF 1 CAPSULE VIA INHALATION DEVICE EVERY DAY 30 capsule 5  . SYMBICORT 160-4.5 MCG/ACT inhaler TAKE 2 PUFFS FIRST THING IN THE MORNING AND THEN ANOTHER 2 PUFFS ABOUT 12 HOURS LATER 10.2 g 5  . torsemide (DEMADEX) 10 MG tablet Take 10 mg by mouth every morning.     No current facility-administered medications on file prior to visit.     BP 112/76 (BP Location: Left Arm, Patient Position: Sitting, Cuff Size: Large)   Pulse 96   Temp 99 F (37.2 C) (Oral)   Resp 20   Ht 6\' 4"  (1.93 m)   Wt 207 lb (93.9 kg)   SpO2 97%   BMI 25.20 kg/m      Objective:   Physical Exam  Constitutional: He is oriented to person, place, and time. Vital signs are normal.  Chronically ill appearing. Ambulatory to exam room with cane  Cardiovascular: Normal rate, regular rhythm and intact distal pulses.   Pulmonary/Chest: He has wheezes.  bilaterally  Musculoskeletal: He exhibits edema.  Mild BLE.  Neurological: He is alert and oriented to person, place, and time. He has normal strength.  Skin:  Skin is warm and dry.  Psychiatric: He has a normal mood and affect. Judgment and thought content normal.      Assessment & Plan:  Flu shot given today.

## 2017-08-26 NOTE — Telephone Encounter (Signed)
Okay for verbal orders. 

## 2017-08-26 NOTE — Patient Instructions (Addendum)
I have given the okay for nursing care at home and completed your disability placard.  Please call [facility] and let them know you'd like to schedule a follow up appointment for your COPD.  Please schedule an appointment for an annual physical at your convenience.  It was nice to meet you. Thanks for letting me take care of you today :)

## 2017-08-26 NOTE — Assessment & Plan Note (Addendum)
He is doing well post-op. Reports minimal pain. Has not needed to take muscle relaxer or tylenol. He Is walking with cane. He is awaiting PT therapy in home. VO sent for nursing care at home

## 2017-08-26 NOTE — Assessment & Plan Note (Addendum)
Maintained on spiriva, symbicort, albuterol, prednisone daily. Hes been feeling short of breath almost daily for the past several months, worsened after recent hospitalization. He will call his pulmonology office to schedule follow up.

## 2017-08-26 NOTE — Telephone Encounter (Signed)
Verbal order for PT  2x a week for 6weeks  [phone] Krystal Clark- Eric  Ok to LVM

## 2017-08-27 DIAGNOSIS — S72012D Unspecified intracapsular fracture of left femur, subsequent encounter for closed fracture with routine healing: Secondary | ICD-10-CM | POA: Diagnosis not present

## 2017-08-27 DIAGNOSIS — I10 Essential (primary) hypertension: Secondary | ICD-10-CM | POA: Diagnosis not present

## 2017-08-27 DIAGNOSIS — I2510 Atherosclerotic heart disease of native coronary artery without angina pectoris: Secondary | ICD-10-CM | POA: Diagnosis not present

## 2017-08-27 DIAGNOSIS — E119 Type 2 diabetes mellitus without complications: Secondary | ICD-10-CM | POA: Diagnosis not present

## 2017-08-27 DIAGNOSIS — J449 Chronic obstructive pulmonary disease, unspecified: Secondary | ICD-10-CM | POA: Diagnosis not present

## 2017-08-27 DIAGNOSIS — J9611 Chronic respiratory failure with hypoxia: Secondary | ICD-10-CM | POA: Diagnosis not present

## 2017-08-27 NOTE — Telephone Encounter (Signed)
LVM giving verbal orders for PT per Ashleigh.

## 2017-08-30 DIAGNOSIS — J449 Chronic obstructive pulmonary disease, unspecified: Secondary | ICD-10-CM | POA: Diagnosis not present

## 2017-08-30 DIAGNOSIS — I10 Essential (primary) hypertension: Secondary | ICD-10-CM | POA: Diagnosis not present

## 2017-08-30 DIAGNOSIS — I2510 Atherosclerotic heart disease of native coronary artery without angina pectoris: Secondary | ICD-10-CM | POA: Diagnosis not present

## 2017-08-30 DIAGNOSIS — S72012D Unspecified intracapsular fracture of left femur, subsequent encounter for closed fracture with routine healing: Secondary | ICD-10-CM | POA: Diagnosis not present

## 2017-08-30 DIAGNOSIS — E119 Type 2 diabetes mellitus without complications: Secondary | ICD-10-CM | POA: Diagnosis not present

## 2017-08-30 DIAGNOSIS — J9611 Chronic respiratory failure with hypoxia: Secondary | ICD-10-CM | POA: Diagnosis not present

## 2017-09-01 DIAGNOSIS — E119 Type 2 diabetes mellitus without complications: Secondary | ICD-10-CM | POA: Diagnosis not present

## 2017-09-01 DIAGNOSIS — I10 Essential (primary) hypertension: Secondary | ICD-10-CM | POA: Diagnosis not present

## 2017-09-01 DIAGNOSIS — J9611 Chronic respiratory failure with hypoxia: Secondary | ICD-10-CM | POA: Diagnosis not present

## 2017-09-01 DIAGNOSIS — S72012D Unspecified intracapsular fracture of left femur, subsequent encounter for closed fracture with routine healing: Secondary | ICD-10-CM | POA: Diagnosis not present

## 2017-09-01 DIAGNOSIS — J449 Chronic obstructive pulmonary disease, unspecified: Secondary | ICD-10-CM | POA: Diagnosis not present

## 2017-09-01 DIAGNOSIS — I2510 Atherosclerotic heart disease of native coronary artery without angina pectoris: Secondary | ICD-10-CM | POA: Diagnosis not present

## 2017-09-02 DIAGNOSIS — I10 Essential (primary) hypertension: Secondary | ICD-10-CM | POA: Diagnosis not present

## 2017-09-02 DIAGNOSIS — J9611 Chronic respiratory failure with hypoxia: Secondary | ICD-10-CM | POA: Diagnosis not present

## 2017-09-02 DIAGNOSIS — J449 Chronic obstructive pulmonary disease, unspecified: Secondary | ICD-10-CM | POA: Diagnosis not present

## 2017-09-02 DIAGNOSIS — E119 Type 2 diabetes mellitus without complications: Secondary | ICD-10-CM | POA: Diagnosis not present

## 2017-09-02 DIAGNOSIS — S72012D Unspecified intracapsular fracture of left femur, subsequent encounter for closed fracture with routine healing: Secondary | ICD-10-CM | POA: Diagnosis not present

## 2017-09-02 DIAGNOSIS — I2510 Atherosclerotic heart disease of native coronary artery without angina pectoris: Secondary | ICD-10-CM | POA: Diagnosis not present

## 2017-09-06 DIAGNOSIS — J449 Chronic obstructive pulmonary disease, unspecified: Secondary | ICD-10-CM | POA: Diagnosis not present

## 2017-09-06 DIAGNOSIS — I2510 Atherosclerotic heart disease of native coronary artery without angina pectoris: Secondary | ICD-10-CM | POA: Diagnosis not present

## 2017-09-06 DIAGNOSIS — S72012D Unspecified intracapsular fracture of left femur, subsequent encounter for closed fracture with routine healing: Secondary | ICD-10-CM | POA: Diagnosis not present

## 2017-09-06 DIAGNOSIS — J9611 Chronic respiratory failure with hypoxia: Secondary | ICD-10-CM | POA: Diagnosis not present

## 2017-09-06 DIAGNOSIS — I10 Essential (primary) hypertension: Secondary | ICD-10-CM | POA: Diagnosis not present

## 2017-09-06 DIAGNOSIS — E119 Type 2 diabetes mellitus without complications: Secondary | ICD-10-CM | POA: Diagnosis not present

## 2017-09-07 DIAGNOSIS — J9611 Chronic respiratory failure with hypoxia: Secondary | ICD-10-CM | POA: Diagnosis not present

## 2017-09-07 DIAGNOSIS — S72012D Unspecified intracapsular fracture of left femur, subsequent encounter for closed fracture with routine healing: Secondary | ICD-10-CM | POA: Diagnosis not present

## 2017-09-07 DIAGNOSIS — E119 Type 2 diabetes mellitus without complications: Secondary | ICD-10-CM | POA: Diagnosis not present

## 2017-09-07 DIAGNOSIS — J449 Chronic obstructive pulmonary disease, unspecified: Secondary | ICD-10-CM | POA: Diagnosis not present

## 2017-09-07 DIAGNOSIS — I10 Essential (primary) hypertension: Secondary | ICD-10-CM | POA: Diagnosis not present

## 2017-09-07 DIAGNOSIS — I2510 Atherosclerotic heart disease of native coronary artery without angina pectoris: Secondary | ICD-10-CM | POA: Diagnosis not present

## 2017-09-09 DIAGNOSIS — E119 Type 2 diabetes mellitus without complications: Secondary | ICD-10-CM | POA: Diagnosis not present

## 2017-09-09 DIAGNOSIS — I10 Essential (primary) hypertension: Secondary | ICD-10-CM | POA: Diagnosis not present

## 2017-09-09 DIAGNOSIS — J449 Chronic obstructive pulmonary disease, unspecified: Secondary | ICD-10-CM | POA: Diagnosis not present

## 2017-09-09 DIAGNOSIS — J9611 Chronic respiratory failure with hypoxia: Secondary | ICD-10-CM | POA: Diagnosis not present

## 2017-09-09 DIAGNOSIS — I2510 Atherosclerotic heart disease of native coronary artery without angina pectoris: Secondary | ICD-10-CM | POA: Diagnosis not present

## 2017-09-09 DIAGNOSIS — S72012D Unspecified intracapsular fracture of left femur, subsequent encounter for closed fracture with routine healing: Secondary | ICD-10-CM | POA: Diagnosis not present

## 2017-09-13 ENCOUNTER — Other Ambulatory Visit: Payer: Self-pay | Admitting: Pulmonary Disease

## 2017-09-13 ENCOUNTER — Ambulatory Visit (INDEPENDENT_AMBULATORY_CARE_PROVIDER_SITE_OTHER): Payer: Medicare Other

## 2017-09-13 ENCOUNTER — Encounter: Payer: Self-pay | Admitting: Pulmonary Disease

## 2017-09-13 ENCOUNTER — Ambulatory Visit (INDEPENDENT_AMBULATORY_CARE_PROVIDER_SITE_OTHER): Payer: Medicare Other | Admitting: Pulmonary Disease

## 2017-09-13 ENCOUNTER — Encounter (INDEPENDENT_AMBULATORY_CARE_PROVIDER_SITE_OTHER): Payer: Self-pay | Admitting: Orthopaedic Surgery

## 2017-09-13 ENCOUNTER — Ambulatory Visit (INDEPENDENT_AMBULATORY_CARE_PROVIDER_SITE_OTHER): Payer: Medicare Other | Admitting: Orthopaedic Surgery

## 2017-09-13 VITALS — BP 126/74 | HR 100 | Ht 76.0 in | Wt 208.0 lb

## 2017-09-13 DIAGNOSIS — Z5181 Encounter for therapeutic drug level monitoring: Secondary | ICD-10-CM

## 2017-09-13 DIAGNOSIS — I2510 Atherosclerotic heart disease of native coronary artery without angina pectoris: Secondary | ICD-10-CM | POA: Diagnosis not present

## 2017-09-13 DIAGNOSIS — J9610 Chronic respiratory failure, unspecified whether with hypoxia or hypercapnia: Secondary | ICD-10-CM | POA: Diagnosis not present

## 2017-09-13 DIAGNOSIS — I4891 Unspecified atrial fibrillation: Secondary | ICD-10-CM | POA: Diagnosis not present

## 2017-09-13 DIAGNOSIS — S72002A Fracture of unspecified part of neck of left femur, initial encounter for closed fracture: Secondary | ICD-10-CM

## 2017-09-13 DIAGNOSIS — J449 Chronic obstructive pulmonary disease, unspecified: Secondary | ICD-10-CM

## 2017-09-13 MED ORDER — TRIAMTERENE-HCTZ 37.5-25 MG PO TABS: 1.0000 | ORAL_TABLET | Freq: Every day | ORAL | 1 refills | Status: DC

## 2017-09-13 MED ORDER — AZITHROMYCIN 250 MG PO TABS: ORAL_TABLET | ORAL | 1 refills | Status: DC

## 2017-09-13 NOTE — Progress Notes (Signed)
Patient is 59 days status post left total hip replacement for a displaced femoral neck fracture.  He is doing home health physical therapy overall doing well.  He denies any significant pain.  He is ambulating with a cane.  Surgical scars well-healed.  His leg lengths are equal.  Painless range of motion of the hip.  X-rays show stable alignment of the total hip replacement.  From my standpoint he is progressing well and I would like to see him back in 2 months for final recheck.  No x-rays needed unless he is having issues.

## 2017-09-13 NOTE — Patient Instructions (Addendum)
For your COPD with recurrent exacerbations of COPD: Take azithromycin 250mg  daily no matter how you feel I am glad you had a flu shot Continue Symbicort Continue Spiriva We will bring you back in 4 weeks to check an EKG and liver function test while you are on azithromycin  For leg swelling: Stop torsemide Stop potassium Resume Maxide 1 pill daily We will check a kidney function test when you come back  For recurrent pulmonary embolism: Continue Xarelto  We will see you back in 4 weeks with a nurse practitioner

## 2017-09-13 NOTE — Progress Notes (Signed)
Subjective:    Patient ID: Theodore [name], male    DOB: 19-Jan-1956, 61 y.o.   MRN: [id number]  Synopsis: Has stage IV COPD (steroid dependent) and history of a pulmonary embolism after a hospitalization in 2015 formerly followed by Dr. Sherene Sires.  - Smoked 3 packs a day at the most, decreased to 1 ppd for a "few years", he quit altogether since 2015 -second lifetime pulmomary embolism in January 2018  HPI Chief Complaint  Patient presents with  . Follow-up    pt states he is "almost back to baseline".  Pt broke hip recently.    [name] recently had a hip fracture after he tripped over his floor fan.  He says that he was just waking up and he tripped over the fan.  He is back home now, he has been walking around again.  He says that his orthopedic surgeon is happy with how things are doing.  He says that while hospitalized his breathing was much worse and he was treatd for a COPD exacerbation.  He couldn't wlak very far wihtout getting short of breath.  That has improved and his lungs are about back to baseline.  H is working with PT now and can walk further without dyspnea.  He can walk at home now about 200 feet without getting short of breath.  His oxygen level has been OK (92-94%) while walking. He recently climbed some stairs with the physical therapist and his O2 saturation dropped down to 90% for a minute or two.  Otherwise he hasn't seen his oxygen level drop too low.  He has a cough, sometimes he produces more mucus than others.  Some days the cough is worse.    He is now taking torsemide and peeing a lot.  His weight is down, but despite this he still has swelling in his R foot.    Past Medical History:  Diagnosis Date  . Anxiety   . Arthritis    "back" (01/24/2014)  . Asthma   . Chronic bronchitis (HCC)   . Cluster headache    "last bout was summer 2014; had them q spring 1991-2001" (01/24/2014)  . COPD (chronic obstructive pulmonary disease) (HCC)   . GERD (gastroesophageal reflux  disease)   . NSTEMI (non-ST elevated myocardial infarction) (HCC) 01/2014  . On home oxygen therapy    "2L; 24/7" (01/24/2014)  . Pneumonia 06/2013; 01/2014  . Pulmonary embolism (HCC) 01/23/2014  . Seasonal allergies       Review of Systems  Constitutional: Positive for fatigue. Negative for chills and fever.  HENT: Negative for postnasal drip, rhinorrhea and sinus pressure.   Respiratory: Positive for chest tightness, shortness of breath and wheezing.   Cardiovascular: Positive for leg swelling. Negative for chest pain and palpitations.       Objective:   Physical Exam  Vitals:   09/13/17 1129  BP: 126/74  Pulse: 100  SpO2: 98%  Weight: 208 lb (94.3 kg)  Height: 6\' 4"  (1.93 m)   RA  Gen: chronically ill appearing HENT: OP clear, TM's clear, neck supple PULM: Wheezes bases B, normal percussion CV: RRR, no mgr, trace edema GI: BS+, soft, nontender Derm: no cyanosis or rash Psyche: normal mood and affect    PFT - PFT's 12/02/2011  FEV1  0.94 (24%) ratio 39 -  18% response to B 2 and DLCO 60%  Lab  -Alpha 1 genotype sent 03/01/12 >  MM    Echo: January 2018 echocardiogram reviewed showing a  normal LVEF but mild pulmonary hypertension  Records from his recent hospitalization for left hip fracture reviewed he was also treated for a COPD exacerbation Records from his visit with cardiology prior to the left hip fracture reviewed he was noted to have paroxysmal A. fib and multifocal atrial tachycardia.  They were treating it with diltiazem, they said that azithromycin should not cause problems.     Assessment & Plan:     Chronic respiratory failure, unspecified whether with hypoxia or hypercapnia (HCC)  Atrial fibrillation, unspecified type (HCC)  COPD GOLD IV   Therapeutic drug monitoring - Plan: Hepatic function panel  Discussion: Fortunately Bao had a hip fracture which was no doubt related to his chronic prednisone use.  We have been unsuccessful in being  able to get him to stop this.  Is currently not smoking cigarettes.  However, he has had recurrent exacerbations of COPD, much more frequently this year.  Because of this I would like for him to start taking daily azithromycin as this has been shown to prevent exacerbations of COPD.  Dr. Rennis Golden said this should be fine based on his recent EKG.  We will need to monitor his EKG for QT prolongation and liver function testing as there are rare reports of these azithromycin.  He recently had a flu shot..  Plan: For your COPD with recurrent exacerbations of COPD: Take azithromycin 250mg  daily no matter how you feel I am glad you had a flu shot Continue Symbicort Continue Spiriva We will bring you back in 4 weeks to check an EKG and liver function test while you are on azithromycin  For leg swelling: Stop torsemide Stop potassium Resume Maxide 1 pill daily We will check a kidney function test when you come back  For recurrent pulmonary embolism: Continue Xarelto  We will see you back in 4 weeks with a nurse practitioner     Current Outpatient Medications:  .  albuterol (PROVENTIL) (2.5 MG/3ML) 0.083% nebulizer solution, Take 2.5 mg by nebulization every 6 (six) hours as needed for wheezing or shortness of breath., Disp: , Rfl:  .  guaiFENesin (MUCINEX) 600 MG 12 hr tablet, Take 1,200 mg by mouth 2 (two) times daily. , Disp: , Rfl:  .  nicotine polacrilex (NICORETTE) 4 MG gum, Take 4 mg by mouth daily as needed for smoking cessation., Disp: , Rfl:  .  OXYGEN, Inhale 2 L/min into the lungs as needed. For sats less than 90%, Disp: , Rfl:  .  polyethylene glycol (MIRALAX / GLYCOLAX) packet, Take 17 g by mouth daily. Hold for constipation, Disp: , Rfl:  .  potassium chloride (K-DUR) 10 MEQ tablet, TAKE 1 TABLET BY MOUTH EVERY DAY, Disp: 90 tablet, Rfl: 1 .  predniSONE (DELTASONE) 10 MG tablet, Take 1 tablet (10 mg total) by mouth daily with breakfast., Disp: 30 tablet, Rfl: 2 .  rivaroxaban  (XARELTO) 20 MG TABS tablet, Take 1 tablet (20 mg total) by mouth daily with supper., Disp: 30 tablet, Rfl: 0 .  sodium chloride (OCEAN) 0.65 % SOLN nasal spray, Place 1 spray into both nostrils 4 (four) times daily as needed for congestion., Disp: , Rfl:  .  SPIRIVA HANDIHALER 18 MCG inhalation capsule, INHALE THE CONTENTS OF 1 CAPSULE VIA INHALATION DEVICE EVERY DAY, Disp: 30 capsule, Rfl: 5 .  SYMBICORT 160-4.5 MCG/ACT inhaler, TAKE 2 PUFFS FIRST THING IN THE MORNING AND THEN ANOTHER 2 PUFFS ABOUT 12 HOURS LATER, Disp: 10.2 g, Rfl: 5 .  torsemide (DEMADEX) 10  MG tablet, Take 10 mg by mouth every morning., Disp: , Rfl:

## 2017-09-15 DIAGNOSIS — I2510 Atherosclerotic heart disease of native coronary artery without angina pectoris: Secondary | ICD-10-CM | POA: Diagnosis not present

## 2017-09-15 DIAGNOSIS — E119 Type 2 diabetes mellitus without complications: Secondary | ICD-10-CM | POA: Diagnosis not present

## 2017-09-15 DIAGNOSIS — I10 Essential (primary) hypertension: Secondary | ICD-10-CM | POA: Diagnosis not present

## 2017-09-15 DIAGNOSIS — J9611 Chronic respiratory failure with hypoxia: Secondary | ICD-10-CM | POA: Diagnosis not present

## 2017-09-15 DIAGNOSIS — J449 Chronic obstructive pulmonary disease, unspecified: Secondary | ICD-10-CM | POA: Diagnosis not present

## 2017-09-15 DIAGNOSIS — S72012D Unspecified intracapsular fracture of left femur, subsequent encounter for closed fracture with routine healing: Secondary | ICD-10-CM | POA: Diagnosis not present

## 2017-09-17 DIAGNOSIS — J9611 Chronic respiratory failure with hypoxia: Secondary | ICD-10-CM | POA: Diagnosis not present

## 2017-09-17 DIAGNOSIS — S72012D Unspecified intracapsular fracture of left femur, subsequent encounter for closed fracture with routine healing: Secondary | ICD-10-CM | POA: Diagnosis not present

## 2017-09-17 DIAGNOSIS — J449 Chronic obstructive pulmonary disease, unspecified: Secondary | ICD-10-CM | POA: Diagnosis not present

## 2017-09-17 DIAGNOSIS — I2510 Atherosclerotic heart disease of native coronary artery without angina pectoris: Secondary | ICD-10-CM | POA: Diagnosis not present

## 2017-09-17 DIAGNOSIS — I10 Essential (primary) hypertension: Secondary | ICD-10-CM | POA: Diagnosis not present

## 2017-09-17 DIAGNOSIS — E119 Type 2 diabetes mellitus without complications: Secondary | ICD-10-CM | POA: Diagnosis not present

## 2017-09-20 DIAGNOSIS — Z86711 Personal history of pulmonary embolism: Secondary | ICD-10-CM

## 2017-09-20 DIAGNOSIS — I252 Old myocardial infarction: Secondary | ICD-10-CM | POA: Diagnosis not present

## 2017-09-20 DIAGNOSIS — E119 Type 2 diabetes mellitus without complications: Secondary | ICD-10-CM

## 2017-09-20 DIAGNOSIS — J449 Chronic obstructive pulmonary disease, unspecified: Secondary | ICD-10-CM | POA: Diagnosis not present

## 2017-09-20 DIAGNOSIS — F419 Anxiety disorder, unspecified: Secondary | ICD-10-CM

## 2017-09-20 DIAGNOSIS — J9611 Chronic respiratory failure with hypoxia: Secondary | ICD-10-CM | POA: Diagnosis not present

## 2017-09-20 DIAGNOSIS — Z7952 Long term (current) use of systemic steroids: Secondary | ICD-10-CM

## 2017-09-20 DIAGNOSIS — Z7901 Long term (current) use of anticoagulants: Secondary | ICD-10-CM | POA: Diagnosis not present

## 2017-09-20 DIAGNOSIS — I2510 Atherosclerotic heart disease of native coronary artery without angina pectoris: Secondary | ICD-10-CM | POA: Diagnosis not present

## 2017-09-20 DIAGNOSIS — I10 Essential (primary) hypertension: Secondary | ICD-10-CM | POA: Diagnosis not present

## 2017-09-20 DIAGNOSIS — Z8701 Personal history of pneumonia (recurrent): Secondary | ICD-10-CM

## 2017-09-20 DIAGNOSIS — Z9181 History of falling: Secondary | ICD-10-CM | POA: Diagnosis not present

## 2017-09-20 DIAGNOSIS — Z7951 Long term (current) use of inhaled steroids: Secondary | ICD-10-CM | POA: Diagnosis not present

## 2017-09-20 DIAGNOSIS — S72012D Unspecified intracapsular fracture of left femur, subsequent encounter for closed fracture with routine healing: Secondary | ICD-10-CM | POA: Diagnosis not present

## 2017-09-20 DIAGNOSIS — Z96642 Presence of left artificial hip joint: Secondary | ICD-10-CM | POA: Diagnosis not present

## 2017-09-22 DIAGNOSIS — E119 Type 2 diabetes mellitus without complications: Secondary | ICD-10-CM | POA: Diagnosis not present

## 2017-09-22 DIAGNOSIS — I2510 Atherosclerotic heart disease of native coronary artery without angina pectoris: Secondary | ICD-10-CM | POA: Diagnosis not present

## 2017-09-22 DIAGNOSIS — J449 Chronic obstructive pulmonary disease, unspecified: Secondary | ICD-10-CM | POA: Diagnosis not present

## 2017-09-22 DIAGNOSIS — J9611 Chronic respiratory failure with hypoxia: Secondary | ICD-10-CM | POA: Diagnosis not present

## 2017-09-22 DIAGNOSIS — I10 Essential (primary) hypertension: Secondary | ICD-10-CM | POA: Diagnosis not present

## 2017-09-22 DIAGNOSIS — S72012D Unspecified intracapsular fracture of left femur, subsequent encounter for closed fracture with routine healing: Secondary | ICD-10-CM | POA: Diagnosis not present

## 2017-09-23 ENCOUNTER — Telehealth: Payer: Self-pay | Admitting: Pulmonary Disease

## 2017-09-23 NOTE — Telephone Encounter (Signed)
Thanks, that's what I thought

## 2017-09-23 NOTE — Telephone Encounter (Signed)
I'm not sure what the order request is for. Honestly this sounds like [name] on a good day.  Please check with him to see if he thinks he is having any trouble.

## 2017-09-23 NOTE — Telephone Encounter (Signed)
lmtcb x1 for Theodore [name] with [facility].

## 2017-09-23 NOTE — Telephone Encounter (Signed)
Spoke with pt, he states since switching back on the triamterene, it has helped with the swelling and it is not as bad as it was. He states he is coughing but not worse than usual and cough ok considering the weather. He states he tried to convince Eunice BlaseDebbie that this is his normal state and he feels he is no worse.

## 2017-09-23 NOTE — Telephone Encounter (Signed)
Spoke with Eunice BlaseDebbie, who states pt has R foot edema,  Eunice BlaseDebbie states she did heard some middle to low right lung crackles during yesterday's eval. Debbie states pt does have occ prod cough with yellow mucus. Eunice BlaseDebbie is requesting order for breather. Eunice BlaseDebbie states she will be leaving her office at 3pm. Eunice BlaseDebbie states we can contact her on her mobile with response.  Debbie's contact number is [phone].  BQ please advise. Thanks.

## 2017-09-23 NOTE — Telephone Encounter (Signed)
Theodore [name] returned call.  She will be available until 3. tr

## 2017-09-23 NOTE — Telephone Encounter (Signed)
lmtcb x2 for Debbie with [facility].

## 2017-09-27 DIAGNOSIS — I2510 Atherosclerotic heart disease of native coronary artery without angina pectoris: Secondary | ICD-10-CM | POA: Diagnosis not present

## 2017-09-27 DIAGNOSIS — E119 Type 2 diabetes mellitus without complications: Secondary | ICD-10-CM | POA: Diagnosis not present

## 2017-09-27 DIAGNOSIS — S72012D Unspecified intracapsular fracture of left femur, subsequent encounter for closed fracture with routine healing: Secondary | ICD-10-CM | POA: Diagnosis not present

## 2017-09-27 DIAGNOSIS — J449 Chronic obstructive pulmonary disease, unspecified: Secondary | ICD-10-CM | POA: Diagnosis not present

## 2017-09-27 DIAGNOSIS — I10 Essential (primary) hypertension: Secondary | ICD-10-CM | POA: Diagnosis not present

## 2017-09-27 DIAGNOSIS — J9611 Chronic respiratory failure with hypoxia: Secondary | ICD-10-CM | POA: Diagnosis not present

## 2017-09-29 DIAGNOSIS — J449 Chronic obstructive pulmonary disease, unspecified: Secondary | ICD-10-CM | POA: Diagnosis not present

## 2017-09-29 DIAGNOSIS — I10 Essential (primary) hypertension: Secondary | ICD-10-CM | POA: Diagnosis not present

## 2017-09-29 DIAGNOSIS — S72012D Unspecified intracapsular fracture of left femur, subsequent encounter for closed fracture with routine healing: Secondary | ICD-10-CM | POA: Diagnosis not present

## 2017-09-29 DIAGNOSIS — E119 Type 2 diabetes mellitus without complications: Secondary | ICD-10-CM | POA: Diagnosis not present

## 2017-09-29 DIAGNOSIS — I2510 Atherosclerotic heart disease of native coronary artery without angina pectoris: Secondary | ICD-10-CM | POA: Diagnosis not present

## 2017-09-29 DIAGNOSIS — J9611 Chronic respiratory failure with hypoxia: Secondary | ICD-10-CM | POA: Diagnosis not present

## 2017-10-04 DIAGNOSIS — I2510 Atherosclerotic heart disease of native coronary artery without angina pectoris: Secondary | ICD-10-CM | POA: Diagnosis not present

## 2017-10-04 DIAGNOSIS — I10 Essential (primary) hypertension: Secondary | ICD-10-CM | POA: Diagnosis not present

## 2017-10-04 DIAGNOSIS — S72012D Unspecified intracapsular fracture of left femur, subsequent encounter for closed fracture with routine healing: Secondary | ICD-10-CM | POA: Diagnosis not present

## 2017-10-04 DIAGNOSIS — E119 Type 2 diabetes mellitus without complications: Secondary | ICD-10-CM | POA: Diagnosis not present

## 2017-10-04 DIAGNOSIS — J449 Chronic obstructive pulmonary disease, unspecified: Secondary | ICD-10-CM | POA: Diagnosis not present

## 2017-10-04 DIAGNOSIS — J9611 Chronic respiratory failure with hypoxia: Secondary | ICD-10-CM | POA: Diagnosis not present

## 2017-10-07 ENCOUNTER — Other Ambulatory Visit: Payer: Self-pay | Admitting: Pulmonary Disease

## 2017-10-11 ENCOUNTER — Other Ambulatory Visit (INDEPENDENT_AMBULATORY_CARE_PROVIDER_SITE_OTHER): Payer: Medicare Other

## 2017-10-11 ENCOUNTER — Ambulatory Visit (INDEPENDENT_AMBULATORY_CARE_PROVIDER_SITE_OTHER): Payer: Medicare Other | Admitting: Acute Care

## 2017-10-11 ENCOUNTER — Encounter: Payer: Self-pay | Admitting: Acute Care

## 2017-10-11 VITALS — BP 120/70 | HR 81 | Ht 76.0 in | Wt 208.0 lb

## 2017-10-11 DIAGNOSIS — Z5181 Encounter for therapeutic drug level monitoring: Secondary | ICD-10-CM | POA: Diagnosis not present

## 2017-10-11 DIAGNOSIS — J449 Chronic obstructive pulmonary disease, unspecified: Secondary | ICD-10-CM | POA: Diagnosis not present

## 2017-10-11 DIAGNOSIS — I2510 Atherosclerotic heart disease of native coronary artery without angina pectoris: Secondary | ICD-10-CM

## 2017-10-11 DIAGNOSIS — J309 Allergic rhinitis, unspecified: Secondary | ICD-10-CM | POA: Diagnosis not present

## 2017-10-11 LAB — HEPATIC FUNCTION PANEL
ALBUMIN: 4.2 g/dL (ref 3.5–5.2)
ALT: 19 U/L (ref 0–53)
AST: 19 U/L (ref 0–37)
Alkaline Phosphatase: 73 U/L (ref 39–117)
Bilirubin, Direct: 0 mg/dL (ref 0.0–0.3)
TOTAL PROTEIN: 7.3 g/dL (ref 6.0–8.3)
Total Bilirubin: 0.4 mg/dL (ref 0.2–1.2)

## 2017-10-11 MED ORDER — BUDESONIDE-FORMOTEROL FUMARATE 160-4.5 MCG/ACT IN AERO: INHALATION_SPRAY | RESPIRATORY_TRACT | 5 refills | Status: DC

## 2017-10-11 MED ORDER — TIOTROPIUM BROMIDE MONOHYDRATE 18 MCG IN CAPS: ORAL_CAPSULE | RESPIRATORY_TRACT | 5 refills | Status: DC

## 2017-10-11 NOTE — Progress Notes (Signed)
History of Present Illness Theodore [name] is a 61 y.o. male with stage IV COPD ( Steroid dependant) and history of PE.   Synopsis: Has stage IV COPD (steroid dependent) and history of a pulmonary embolism after a hospitalization in 2015 formerly followed by Dr. Sherene SiresWert.  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 Kendrick FriesMcQuaid 09/13/2017 Pt. Returns for follow up. He was seen by Dr. Kendrick FriesMcQuaid 09/13/17. He had been in the hospital with COPD exacerbation at that time after falling and fracturing his hip.He is continuing to work with PT. He is maintained on torsemide. He was started on daily azithromycin by Dr. Kendrick FriesMcQuaid to see if he has fewer COPD exacerbations. He is here for EKG and LFT's for drug monitoring. Pt. Presents today stating he thinks his cough has improved on the medication. He states he is compliant with his Symbicort and Spiriva. He is using his rescue inhaler about twice daily. He states symptoms are worse in the evening. He states his nose is irritated.He denies fever, chest pain, orthopnea or hemoptysis.Pt. States he is back to his baseline activity.We will check 12 Lead and LFT's for  drug therapy monitoring.  Test Results:  12 lead EKG 10/11/2017>> QTc >> 0.44  LFT's 10/11/2017>> pending  PFT - PFT's 12/02/2011  FEV1  0.94 (24%) ratio 39 -  18% response to B 2 and DLCO 60%  Lab  -Alpha 1 genotype sent 03/01/12 >  MM    Echo: January 2018 echocardiogram reviewed showing a normal LVEF but mild pulmonary hypertension  Records from his recent hospitalization for left hip fracture reviewed he was also treated for a COPD exacerbation Records from his visit with cardiology prior to the left hip fracture reviewed he was noted to have paroxysmal A. fib and multifocal atrial tachycardia.  They were treating it with diltiazem, they said  that azithromycin should not cause problems.  CBC Latest Ref Rng & Units 07/28/2017 07/19/2017 07/18/2017  WBC - 11.5 10.8(H) 12.9(H)  Hemoglobin 13.5 - 17.5 11.4(A) 10.8(L) 11.5(L)  Hematocrit 41 - 53 34(A) 32.9(L) 34.5(L)  Platelets 150 - 399 490(A) 267 290    BMP Latest Ref Rng & Units 08/07/2017 07/28/2017 07/21/2017  Glucose 65 - 99 mg/dL - - 086(V111(H)  BUN 4 - 21 12 10 10   Creatinine 0.6 - 1.3 0.8 0.8 0.80  Sodium 137 - 147 140 136(A) 133(L)  Potassium 3.4 - 5.3 4.5 4.3 3.6  Chloride 101 - 111 mmol/L - - 100(L)  CO2 22 - 32 mmol/L - - 27  Calcium 8.9 - 10.3 mg/dL - - 8.2(L)     ProBNP    Component Value Date/Time   PROBNP 186.3 (H) 01/23/2014 1622    PFT    Component Value Date/Time   FEV1PRE 0.71 01/22/2017 1147   FEV1POST 0.85 01/22/2017 1147   FVCPRE 2.34 01/22/2017 1147   FVCPOST 2.60 01/22/2017 1147   DLCOUNC 15.30 01/22/2017 1147   PREFEV1FVCRT 30 01/22/2017 1147   PSTFEV1FVCRT 33 01/22/2017 1147    Xr Pelvis 1-2 Views  Result Date: 09/13/2017 Stable left total hip replacement    Past medical hx Past Medical History:  Diagnosis Date  . Anxiety   . Arthritis    "back" (01/24/2014)  . Asthma   . Chronic bronchitis (HCC)   . Cluster headache    "  last bout was summer 2014; had them q spring 1991-2001" (01/24/2014)  . COPD (chronic obstructive pulmonary disease) (HCC)   . GERD (gastroesophageal reflux disease)   . NSTEMI (non-ST elevated myocardial infarction) (HCC) 01/2014  . On home oxygen therapy    "2L; 24/7" (01/24/2014)  . Pneumonia 06/2013; 01/2014  . Pulmonary embolism (HCC) 01/23/2014  . Seasonal allergies      Social History   Tobacco Use  . Smoking status: Former Smoker    Packs/day: 2.00    Years: 35.00    Pack years: 70.00    Types: Cigarettes    Last attempt to quit: 01/24/2012    Years since quitting: 5.7  . Smokeless tobacco: Never Used  . Tobacco comment: Remain smoke free  Substance Use Topics  . Alcohol use: Yes    Comment:  01/24/2014 "holidays; family birthdays"  . Drug use: No    [name] reports that he quit smoking about 5 years ago. His smoking use included cigarettes. He has a 70.00 pack-year smoking history. he has never used smokeless tobacco. He reports that he drinks alcohol. He reports that he does not use drugs.  Tobacco Cessation: Former smoker quit 2013  Past surgical hx, Family hx, Social hx all reviewed.  Current Outpatient Medications on File Prior to Visit  Medication Sig  . albuterol (PROVENTIL) (2.5 MG/3ML) 0.083% nebulizer solution Take 2.5 mg by nebulization every 6 (six) hours as needed for wheezing or shortness of breath.  Marland Kitchen. azithromycin (ZITHROMAX) 250 MG tablet 1 tablet daily (maintenance)  . guaiFENesin (MUCINEX) 600 MG 12 hr tablet Take 1,200 mg by mouth 2 (two) times daily.   . nicotine polacrilex (NICORETTE) 4 MG gum Take 4 mg by mouth daily as needed for smoking cessation.  . OXYGEN Inhale 2 L/min into the lungs as needed. For sats less than 90%  . predniSONE (DELTASONE) 10 MG tablet Take 1 tablet (10 mg total) by mouth daily with breakfast.  . sodium chloride (OCEAN) 0.65 % SOLN nasal spray Place 1 spray into both nostrils 4 (four) times daily as needed for congestion.  . triamterene-hydrochlorothiazide (MAXZIDE-25) 37.5-25 MG tablet TAKE 1 TABLET BY MOUTH DAILY  . XARELTO 20 MG TABS tablet TAKE 1 TABLET BY MOUTH EVERY EVENING   No current facility-administered medications on file prior to visit.      Allergies  Allergen Reactions  . Naproxen Sodium Itching    Review Of Systems:  Constitutional:   No  weight loss, night sweats,  Fevers, chills, fatigue, or  lassitude.  HEENT:   No headaches,  Difficulty swallowing,  Tooth/dental problems, or  Sore throat,                No sneezing, itching, ear ache, nasal congestion, post nasal drip,   CV:  No chest pain,  Orthopnea, PND, swelling in lower extremities, anasarca, dizziness, palpitations, syncope.   GI  No  heartburn, indigestion, abdominal pain, nausea, vomiting, diarrhea, change in bowel habits, loss of appetite, bloody stools.   Resp: + shortness of breath with exertion less at rest.  No excess mucus, no productive cough,  No non-productive cough,  No coughing up of blood.  No change in color of mucus.  No wheezing.  No chest wall deformity  Skin: no rash or lesions.  GU: no dysuria, change in color of urine, no urgency or frequency.  No flank pain, no hematuria   MS:  No joint pain or swelling.  No decreased range of motion.  No back pain.  Psych:  No change in mood or affect. No depression or anxiety.  No memory loss.   Vital Signs BP 120/70 (BP Location: Left Arm, Cuff Size: Normal)   Pulse 81   Ht 6\' 4"  (1.93 m)   Wt 208 lb (94.3 kg)   SpO2 96%   BMI 25.32 kg/m    Physical Exam:  General- No distress,  A&Ox3, pleasant ENT: No sinus tenderness, TM clear, pale nasal mucosa, no oral exudate,no post nasal drip, no LAN Cardiac: S1, S2, regular rate and rhythm, no murmur Chest: No wheeze/ rales/ dullness; no accessory muscle use, no nasal flaring, no sternal retractions, few rhonchi per bases Abd.: Soft Non-tender, non-distended Ext: No clubbing cyanosis, edema Neuro:  Deconditioned at baseline Skin: No rashes, warm and dry, intact, no lesions Psych: normal mood and behavior   Assessment/Plan  COPD GOLD IV  Improved with azithromycin Plan: Continue your azithromycin daily as you have been doing. Continue Symbicort 2 puffs twice daily Continue Spiriva once daily. We will send in refills for both. Rinse mouth after use. Continue your rescue inhaler as needed for breakthrough shortness of breath. We will do a 12 Lead EKG today We will check liver functions today. We will call you with results. Please contact office for sooner follow up if symptoms do not improve or worsen or seek emergency care  Follow up with Dr. Kendrick Fries in 3 months.   Allergic rhinitis Mild  exacerbation Plan: Start your Flonase for your nasal congestion You can add Zyrtec for post nasal drip as needed.    Bevelyn Ngo, NP 10/11/2017  3:31 PM

## 2017-10-11 NOTE — Assessment & Plan Note (Signed)
Mild exacerbation Plan: Start your Flonase for your nasal congestion You can add Zyrtec for post nasal drip as needed.

## 2017-10-11 NOTE — Assessment & Plan Note (Addendum)
Improved with azithromycin Plan: Continue your azithromycin daily as you have been doing. Continue Symbicort 2 puffs twice daily Continue Spiriva once daily. We will send in refills for both. Rinse mouth after use. Continue your rescue inhaler as needed for breakthrough shortness of breath. We will do a 12 Lead EKG today We will check liver functions today. We will call you with results. Please contact office for sooner follow up if symptoms do not improve or worsen or seek emergency care  Follow up with Dr. Kendrick FriesMcQuaid in 3 months.

## 2017-10-11 NOTE — Patient Instructions (Addendum)
It is nice to see you today. Continue your azithromycin daily as you have been doing. Continue Symbicort 2 puffs twice daily Continue Spiriva once daily. We will send in refills for both. Rinse mouth after use. Start your Flonase for your nasal congestion You can add Zyrtec for post nasal drip as needed. Continue your rescue inhaler as needed for breakthrough shortness of breath. We will do a 12 Lead EKG today We will check liver functions today. We will call you with results. Please contact office for sooner follow up if symptoms do not improve or worsen or seek emergency care  Follow up with Dr. Kendrick FriesMcQuaid in 3 months.

## 2017-10-12 ENCOUNTER — Ambulatory Visit: Payer: Self-pay | Admitting: Nurse Practitioner

## 2017-11-04 ENCOUNTER — Other Ambulatory Visit: Payer: Self-pay | Admitting: Pulmonary Disease

## 2017-11-06 ENCOUNTER — Other Ambulatory Visit: Payer: Self-pay | Admitting: Pulmonary Disease

## 2017-11-10 ENCOUNTER — Ambulatory Visit: Payer: Self-pay | Admitting: Nurse Practitioner

## 2017-11-11 ENCOUNTER — Other Ambulatory Visit: Payer: Self-pay | Admitting: Pulmonary Disease

## 2017-11-12 ENCOUNTER — Encounter (INDEPENDENT_AMBULATORY_CARE_PROVIDER_SITE_OTHER): Payer: Self-pay | Admitting: Orthopaedic Surgery

## 2017-11-12 ENCOUNTER — Ambulatory Visit (INDEPENDENT_AMBULATORY_CARE_PROVIDER_SITE_OTHER): Payer: PPO | Admitting: Orthopaedic Surgery

## 2017-11-12 ENCOUNTER — Ambulatory Visit (INDEPENDENT_AMBULATORY_CARE_PROVIDER_SITE_OTHER): Payer: PPO

## 2017-11-12 DIAGNOSIS — M25552 Pain in left hip: Secondary | ICD-10-CM

## 2017-11-12 DIAGNOSIS — Z96642 Presence of left artificial hip joint: Secondary | ICD-10-CM

## 2017-11-12 DIAGNOSIS — M7062 Trochanteric bursitis, left hip: Secondary | ICD-10-CM

## 2017-11-12 NOTE — Progress Notes (Signed)
Office Visit Note   Patient: Theodore [name]           Date of Birth: 01-Nov-1956           MRN: [id number] Visit Date: 11/12/2017              Requested by: Evaristo Bury, NP [address] [place], [place] [address] PCP: Evaristo Bury, NP   Assessment & Plan: Visit Diagnoses:  1. History of total hip replacement, left   2. Pain in left hip     Plan: Based on clinical exam and x-rays from today had, I do not believe there is evidence of joint infection or prosthetic loosening.  Believe this is all trochanteric bursitis that is starting to resolve.  Do not believe this is bad enough to proceed with cortisone injection however I do feel it is appropriate to instruct the patient on IT band exercise program.  If he is not any better over the next several weeks he will call us and let us know we will proceed with a cortisone injection to the left trochanteric bursa.  Otherwise he will follow-up with [place] in 8 months time for repeat evaluation and x-ray   Follow-Up Instructions: Return in about 8 months (around 07/13/2018).    Orders:  Orders Placed This Encounter  Procedures  . XR HIP UNILAT W OR W/O PELVIS 2-3 VIEWS LEFT   No orders of the defined types were placed in this encounter.     Procedures: No procedures performed   Clinical Data: No additional findings.   Subjective: Chief Complaint  Patient presents with  . Left Hip - Pain    HPI this is a pleasant 62 year old gentleman who presents to our clinic today 4 months out left anterior total hip replacement 07/16/2017.  He was doing very well up until the day after Christmas.  No specific injury but he does note sitting in a low couch for several hours on Christmas day.  Following this he had a 3-day course of pain to the left lateral hip.  Pain was worse sleeping on the affected side.  The pain is since started to subside.  Review of Systems as detailed in HPI all others reviewed and are  negative.   Objective: Vital Signs: There were no vitals taken for this visit.  Physical Exam well-developed well-nourished gentleman in no acute distress.  Alert and oriented x3.  Ortho Exam examination of his left hip reveals painless logroll.  Minimally positive straight leg raise.  Moderate tenderness over the trochanteric bursa.  He is neurovascular intact distally.  Specialty Comments:  No specialty comments available.  Imaging: Xr Hip Unilat W Or W/o Pelvis 2-3 Views Left  Result Date: 11/12/2017 3 views of the left hip reveal a well-seated prosthesis without evidence of osteolysis or subsidence    PMFS History: Patient Active Problem List   Diagnosis Date Noted  . Allergic rhinitis 10/11/2017  . Constipation due to opioid therapy 08/06/2017  . Hyperglycemia 07/26/2017  . Recurrent pulmonary emboli (HCC) 07/26/2017  . History of MI (myocardial infarction) 07/26/2017  . History of total hip replacement, left 07/26/2017  . Chronic respiratory failure with hypoxia (HCC) 07/16/2017  . Left displaced femoral neck fracture (HCC) 07/14/2017  . Closed displaced fracture of left femoral neck (HCC) 07/14/2017  . Hypokalemia   . Cardiac arrhythmia 07/06/2017  . Costochondral chest pain 12/28/2016  . Bilateral lower extremity edema 12/28/2016  . Chronic anticoagulation 12/23/2016  .  DOE (dyspnea on exertion) 12/03/2016  . Chest pain 12/03/2016  . Anxiety state 10/30/2015  . Back pain 09/03/2014  . HBP (high blood pressure) 01/29/2014  . CAD (coronary artery disease) 01/23/2014  . Chronic respiratory failure assoc with cor pulmonale 01/20/2014  . Acute cor pulmonale (HCC) 01/12/2014  . DM (diabetes mellitus) (HCC) 01/10/2014  . Multiple lung nodules 10/24/2013  . Smoking 03/03/2012  . COPD GOLD IV  09/28/2011   Past Medical History:  Diagnosis Date  . Anxiety   . Arthritis    "back" (01/24/2014)  . Asthma   . Chronic bronchitis (HCC)   . Cluster headache    "last  bout was summer 2014; had them q spring 1991-2001" (01/24/2014)  . COPD (chronic obstructive pulmonary disease) (HCC)   . GERD (gastroesophageal reflux disease)   . NSTEMI (non-ST elevated myocardial infarction) (HCC) 01/2014  . On home oxygen therapy    "2L; 24/7" (01/24/2014)  . Pneumonia 06/2013; 01/2014  . Pulmonary embolism (HCC) 01/23/2014  . Seasonal allergies     Family History  Problem Relation Age of Onset  . Leukemia Father   . Heart disease Father   . Asthma Father   . Asthma Sister   . Emphysema Sister   . Heart disease Sister   . High blood pressure Mother   . Colon cancer Cousin        First cousin    Past Surgical History:  Procedure Laterality Date  . COLONOSCOPY WITH PROPOFOL N/A 10/20/2016   Procedure: COLONOSCOPY WITH PROPOFOL;  Surgeon: Sherrilyn RistHenry L Danis III, MD;  Location: [facility];  Service: Gastroenterology;  Laterality: N/A;  . NASAL SEPTOPLASTY W/ TURBINOPLASTY Left 1983  . TOTAL HIP ARTHROPLASTY Left 07/16/2017   Procedure: TOTAL HIP ARTHROPLASTY ANTERIOR APPROACH;  Surgeon: Tarry KosXu, Naiping M, MD;  Location: [facility] OR;  Service: Orthopedics;  Laterality: Left;  . TURBINATE REDUCTION Bilateral ~ 1983   Social History   Occupational History  . Occupation: Disability    Employer: [employer]  Tobacco Use  . Smoking status: Former Smoker    Packs/day: 2.00    Years: 35.00    Pack years: 70.00    Types: Cigarettes    Last attempt to quit: 01/24/2012    Years since quitting: 5.8  . Smokeless tobacco: Never Used  . Tobacco comment: Remain smoke free  Substance and Sexual Activity  . Alcohol use: Yes    Comment: 01/24/2014 "holidays; family birthdays"  . Drug use: No  . Sexual activity: No

## 2017-12-27 ENCOUNTER — Other Ambulatory Visit: Payer: Self-pay | Admitting: Pulmonary Disease

## 2018-03-11 ENCOUNTER — Other Ambulatory Visit: Payer: Self-pay | Admitting: Pulmonary Disease

## 2018-04-11 ENCOUNTER — Other Ambulatory Visit: Payer: Self-pay | Admitting: Acute Care

## 2018-04-11 ENCOUNTER — Other Ambulatory Visit: Payer: Self-pay | Admitting: Pulmonary Disease

## 2018-04-12 ENCOUNTER — Other Ambulatory Visit: Payer: Self-pay | Admitting: Pulmonary Disease

## 2018-04-13 ENCOUNTER — Telehealth: Payer: Self-pay | Admitting: Pulmonary Disease

## 2018-04-13 MED ORDER — RIVAROXABAN 20 MG PO TABS: 20.0000 mg | ORAL_TABLET | Freq: Every evening | ORAL | 0 refills | Status: DC

## 2018-04-13 NOTE — Telephone Encounter (Signed)
Rx has been sent in. Nothing further was needed. 

## 2018-05-08 ENCOUNTER — Other Ambulatory Visit: Payer: Self-pay | Admitting: Pulmonary Disease

## 2018-05-09 ENCOUNTER — Other Ambulatory Visit: Payer: Self-pay | Admitting: Acute Care

## 2018-05-10 ENCOUNTER — Other Ambulatory Visit: Payer: Self-pay | Admitting: Pulmonary Disease

## 2018-05-11 ENCOUNTER — Other Ambulatory Visit: Payer: Self-pay | Admitting: Pulmonary Disease

## 2018-05-13 ENCOUNTER — Other Ambulatory Visit: Payer: Self-pay | Admitting: Acute Care

## 2018-05-17 ENCOUNTER — Other Ambulatory Visit: Payer: Self-pay | Admitting: Pulmonary Disease

## 2018-05-20 ENCOUNTER — Telehealth: Payer: Self-pay | Admitting: Pulmonary Disease

## 2018-05-20 MED ORDER — RIVAROXABAN 20 MG PO TABS: 20.0000 mg | ORAL_TABLET | Freq: Every evening | ORAL | 0 refills | Status: DC

## 2018-05-20 NOTE — Telephone Encounter (Signed)
Spoke with pt. He needs a refill on Xarelto. Rx has been sent in. Nothing further was needed.

## 2018-05-23 ENCOUNTER — Encounter: Payer: Self-pay | Admitting: Pulmonary Disease

## 2018-05-23 ENCOUNTER — Ambulatory Visit: Payer: PPO | Admitting: Pulmonary Disease

## 2018-05-23 VITALS — BP 144/80 | HR 80 | Ht 76.0 in | Wt 224.0 lb

## 2018-05-23 DIAGNOSIS — J449 Chronic obstructive pulmonary disease, unspecified: Secondary | ICD-10-CM | POA: Diagnosis not present

## 2018-05-23 DIAGNOSIS — J9610 Chronic respiratory failure, unspecified whether with hypoxia or hypercapnia: Secondary | ICD-10-CM | POA: Diagnosis not present

## 2018-05-23 DIAGNOSIS — I4891 Unspecified atrial fibrillation: Secondary | ICD-10-CM | POA: Diagnosis not present

## 2018-05-23 MED ORDER — TIOTROPIUM BROMIDE MONOHYDRATE 18 MCG IN CAPS: ORAL_CAPSULE | RESPIRATORY_TRACT | 5 refills | Status: DC

## 2018-05-23 MED ORDER — BUDESONIDE-FORMOTEROL FUMARATE 160-4.5 MCG/ACT IN AERO: INHALATION_SPRAY | RESPIRATORY_TRACT | 5 refills | Status: DC

## 2018-05-23 MED ORDER — RIVAROXABAN 20 MG PO TABS: 20.0000 mg | ORAL_TABLET | Freq: Every evening | ORAL | 3 refills | Status: DC

## 2018-05-23 MED ORDER — ALBUTEROL SULFATE (2.5 MG/3ML) 0.083% IN NEBU: 2.5000 mg | INHALATION_SOLUTION | Freq: Four times a day (QID) | RESPIRATORY_TRACT | 11 refills | Status: DC | PRN

## 2018-05-23 NOTE — Addendum Note (Signed)
Addended by: Sheran LuzEAST, Anabela Crayton K on: 05/23/2018 12:32 PM   Modules accepted: Orders

## 2018-05-23 NOTE — Patient Instructions (Signed)
Severe COPD with recurrent exacerbations: Continue Spiriva Continue Symbicort Prednisone: Take 10 mg daily alternating with 5 mg daily for the next month.  For example, on Monday take 10 mg, Tuesday take 5 mg, Wednesday take 10 mg etc. until you see our nurse practitioner in 4 weeks.  If you were doing well on that visit then you can either cut back to 5 mg daily or a slower adjusted taper.  I think it is reasonable to try to taper you off of the prednisone over the next 6 months but not to abruptly.  Follow-up with a nurse practitioner in 4 weeks

## 2018-05-23 NOTE — Progress Notes (Signed)
Subjective:    Patient ID: Theodore [name], male    DOB: April 12, 1956, 62 y.o.   MRN: [id number]  Synopsis: Has stage IV COPD (steroid dependent) and history of a pulmonary embolism after a hospitalization in 2015 formerly followed by Dr. Sherene Sires.  - Smoked 3 packs a day at the most, decreased to 1 ppd for a "few years", he quit altogether since 2015 -second lifetime pulmomary embolism in January 2018  HPI Chief Complaint  Patient presents with  . Follow-up   Theodore [name] says that he feels like he has been doing pretty well.  He says that rainy days are particuarly bad for him, especially with the record setting rain he has more trouble.  He says that the heat bothers him a lot so he tries to avoid it as best as possible.  He says that in general the mornings to mid afternoon are decent, but by mid afternoon it gets harder for him to breathe.  He is still using his medications routinely without difficulty.  He does try to walk in his house for exercise.    He stopped taking the azithromycin every day because he was a little paranoid about the side effects of heart rhythm problems.  He says that he didn't really think that it was helping him much.      Past Medical History:  Diagnosis Date  . Anxiety   . Arthritis    "back" (01/24/2014)  . Asthma   . Chronic bronchitis (HCC)   . Cluster headache    "last bout was summer 2014; had them q spring 1991-2001" (01/24/2014)  . COPD (chronic obstructive pulmonary disease) (HCC)   . GERD (gastroesophageal reflux disease)   . NSTEMI (non-ST elevated myocardial infarction) (HCC) 01/2014  . On home oxygen therapy    "2L; 24/7" (01/24/2014)  . Pneumonia 06/2013; 01/2014  . Pulmonary embolism (HCC) 01/23/2014  . Seasonal allergies       Review of Systems  Constitutional: Positive for fatigue. Negative for chills and fever.  HENT: Negative for postnasal drip, rhinorrhea and sinus pressure.   Respiratory: Positive for chest tightness, shortness of breath  and wheezing.   Cardiovascular: Positive for leg swelling. Negative for chest pain and palpitations.       Objective:   Physical Exam  Vitals:   05/23/18 0902  BP: (!) 144/80  Pulse: 80  SpO2: 96%  Weight: 224 lb (101.6 kg)  Height: 6\' 4"  (1.93 m)   RA  Gen: chronically ill appearing HENT: OP clear, TM's clear, neck supple PULM: CTA B, normal percussion CV: RRR, no mgr, trace edema GI: BS+, soft, nontender Derm: no cyanosis or rash Psyche: normal mood and affect  PFT - PFT's 12/02/2011  FEV1  0.94 (24%) ratio 39 -  18% response to B 2 and DLCO 60%  Lab  -Alpha 1 genotype sent 03/01/12 >  MM    Echo: January 2018 echocardiogram reviewed showing a normal LVEF but mild pulmonary hypertension  Records from his recent hospitalization for left hip fracture reviewed he was also treated for a COPD exacerbation Records from his visit with cardiology prior to the left hip fracture reviewed he was noted to have paroxysmal A. fib and multifocal atrial tachycardia.  They were treating it with diltiazem, they said that azithromycin should not cause problems.     Assessment & Plan:     COPD GOLD IV   Chronic respiratory failure, unspecified whether with hypoxia or hypercapnia (HCC)  Atrial fibrillation, unspecified  type [facility](HCC)  Discussion: This has been a stable interval for Theodore [name].  He self discontinued the azithromycin for concerns over side effects.  Fortunately, the intended effect of him being on azithromycin has been reached and that he has not had exacerbations of his COPD.  He would like to taper prednisone, I agree with this plan  Plan: Severe COPD with recurrent exacerbations: Continue Spiriva Continue Symbicort Prednisone: Take 10 mg daily alternating with 5 mg daily for the next month.  For example, on Monday take 10 mg, Tuesday take 5 mg, Wednesday take 10 mg etc. until you see our nurse practitioner in 4 weeks.  If you were doing well on that visit then  you can either cut back to 5 mg daily or a slower adjusted taper.  I think it is reasonable to try to taper you off of the prednisone over the next 6 months but not to abruptly.  Follow-up with a nurse practitioner in 4 weeks    Current Outpatient Medications:  .  albuterol (PROVENTIL) (2.5 MG/3ML) 0.083% nebulizer solution, Take 2.5 mg by nebulization every 6 (six) hours as needed for wheezing or shortness of breath., Disp: , Rfl:  .  budesonide-formoterol (SYMBICORT) 160-4.5 MCG/ACT inhaler, INHALE 2 PUFFS FIRST THING IN THE MORNING AND ANOTHER 2 PUFFS ABOUT 12 HOURS LATER, Disp: 10.2 g, Rfl: 0 .  guaiFENesin (MUCINEX) 600 MG 12 hr tablet, Take 1,200 mg by mouth 2 (two) times daily. , Disp: , Rfl:  .  nicotine polacrilex (NICORETTE) 4 MG gum, Take 4 mg by mouth daily as needed for smoking cessation., Disp: , Rfl:  .  OXYGEN, Inhale 2 L/min into the lungs as needed. For sats less than 90%, Disp: , Rfl:  .  predniSONE (DELTASONE) 10 MG tablet, TAKE 1 TABLET(10 MG) BY MOUTH DAILY WITH BREAKFAST, Disp: 30 tablet, Rfl: 0 .  PROAIR HFA 108 (90 Base) MCG/ACT inhaler, INHALE 2 PUFFS INTO THE LUNGS EVERY 6 HOURS AS NEEDED FOR WHEEZING OR SHORTNESS OF BREATH, Disp: 8.5 g, Rfl: 0 .  rivaroxaban (XARELTO) 20 MG TABS tablet, Take 1 tablet (20 mg total) by mouth every evening., Disp: 30 tablet, Rfl: 0 .  sodium chloride (OCEAN) 0.65 % SOLN nasal spray, Place 1 spray into both nostrils 4 (four) times daily as needed for congestion., Disp: , Rfl:  .  tiotropium (SPIRIVA HANDIHALER) 18 MCG inhalation capsule, INHALE THE CONTENTS OF 1 CAPSULE VIA INHALATION DEVICE EVERY DAY, Disp: 30 capsule, Rfl: 0 .  triamterene-hydrochlorothiazide (MAXZIDE-25) 37.5-25 MG tablet, TAKE 1 TABLET BY MOUTH DAILY, Disp: 30 tablet, Rfl: 5

## 2018-05-24 ENCOUNTER — Other Ambulatory Visit: Payer: Self-pay | Admitting: Pulmonary Disease

## 2018-06-01 ENCOUNTER — Other Ambulatory Visit: Payer: Self-pay | Admitting: Pulmonary Disease

## 2018-06-07 ENCOUNTER — Other Ambulatory Visit: Payer: Self-pay | Admitting: Pulmonary Disease

## 2018-06-20 ENCOUNTER — Other Ambulatory Visit: Payer: Self-pay | Admitting: Pulmonary Disease

## 2018-06-23 ENCOUNTER — Ambulatory Visit: Payer: PPO | Admitting: Primary Care

## 2018-06-23 ENCOUNTER — Encounter: Payer: Self-pay | Admitting: Primary Care

## 2018-06-23 DIAGNOSIS — J449 Chronic obstructive pulmonary disease, unspecified: Secondary | ICD-10-CM | POA: Diagnosis not present

## 2018-06-23 MED ORDER — MONTELUKAST SODIUM 10 MG PO TABS: 10.0000 mg | ORAL_TABLET | Freq: Every day | ORAL | 11 refills | Status: DC

## 2018-06-23 NOTE — Assessment & Plan Note (Addendum)
-   Doing fairly well, continues Symbicort 160 and Spiriva  - Slowly tapering prednisone (5mg  prednisone daily, alternating with 10mg  every 3rd day) - Patient will consider re-starting azirthomycin at next vist, could also consider daliresp - Add singulair  - FU in 4-6 weeks

## 2018-06-23 NOTE — Progress Notes (Signed)
 @Patient  ID: Theodore [name], male    DOB: [birth year], 62 y.o.   MRN: [id number]  Chief Complaint  Patient presents with  . Follow-up    SOb-cough-wheezing in PM    Referring provider: Evaristo BuryShambley, Ashleigh N, NP  HPI: 62 year old male, former smoker (quit 2015). 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 Patient of Dr. Kendrick FriesMcQuaid, previously followed by Dr. Sherene SiresWert.   Last seen on 05/23/18. Patient self stopped Azithromycin d/t concerns for potential heart arrhythmias and felt like it was not helping. Doing well overall during most recent visit, reports having bad days when its raining. Prednisone changed to 10mg  alternating days with 5mg . Plan to taper if doing ok.  Continues on Symbicort 160 and Spiriva   06/23/2018 Presents today for 1 month fu. Patient states that he feels about the same, no significant change with decrease in prednisone. If anything, he feels a little tighter on the days he takes 5mg  prednisone and has a scratchy cough. States that its hard to tell because his symptoms fluctuate on a regular basis. It has not been raining as much. He would like to taper down prednisone but he is afraid too. He has a congested cough, no colored sputum. Uses rescue inhaler a couple times a day. Has nebulizer as well which gives him longer relief. Does not do well with flutter valve, states that it made him cough so much he would "pass out" and he couldn't stand the feeling. He is willing to consider restarting azithromycin. He does have nasal congestion, using Flonase now. He has taken Singulair in the past, unsure why he stopped. He also takes vit D supplement     Allergies  Allergen Reactions  . Naproxen Sodium Itching    Immunization History  Administered Date(s) Administered  . Influenza Split 08/10/2011, 07/27/2012  . Influenza,inj,Quad PF,6+ Mos 07/14/2013, 08/06/2014, 10/17/2015, 07/21/2016,  08/26/2017  . PPD Test 07/22/2017, 07/29/2017  . Pneumococcal Conjugate-13 08/06/2014  . Pneumococcal Polysaccharide-23 02/05/2012  . Tdap 09/03/2014, 07/14/2017    Past Medical History:  Diagnosis Date  . Anxiety   . Arthritis    "back" (01/24/2014)  . Asthma   . Chronic bronchitis (HCC)   . Cluster headache    "last bout was summer 2014; had them q spring 1991-2001" (01/24/2014)  . COPD (chronic obstructive pulmonary disease) (HCC)   . GERD (gastroesophageal reflux disease)   . NSTEMI (non-ST elevated myocardial infarction) (HCC) 01/2014  . On home oxygen therapy    "2L; 24/7" (01/24/2014)  . Pneumonia 06/2013; 01/2014  . Pulmonary embolism (HCC) 01/23/2014  . Seasonal allergies     Tobacco History: Social History   Tobacco Use  Smoking Status Former Smoker  . Packs/day: 2.00  . Years: 35.00  . Pack years: 70.00  . Types: Cigarettes  . Last attempt to quit: 01/24/2012  . Years since quitting: 6.4  Smokeless Tobacco Never Used  Tobacco Comment   Remain smoke free   Counseling given: Not Answered Comment: Remain smoke free   Outpatient Medications Prior to Visit  Medication Sig Dispense Refill  . albuterol (PROVENTIL) (2.5 MG/3ML) 0.083% nebulizer solution Take 3 mLs (2.5 mg total) by nebulization every 6 (six) hours as needed for wheezing or shortness of breath. 75 mL 11  . budesonide-formoterol (SYMBICORT) 160-4.5 MCG/ACT inhaler INHALE 2 PUFFS FIRST THING IN THE MORNING AND ANOTHER 2 PUFFS ABOUT 12 HOURS LATER 10.2 g 5  .  fluticasone (FLONASE) 50 MCG/ACT nasal spray Place 1 spray into both nostrils daily.    Marland Kitchen. guaiFENesin (MUCINEX) 600 MG 12 hr tablet Take 1,200 mg by mouth 2 (two) times daily.     . nicotine polacrilex (NICORETTE) 4 MG gum Take 4 mg by mouth daily as needed for smoking cessation.    . OXYGEN Inhale 2 L/min into the lungs as needed. For sats less than 90%    . predniSONE (DELTASONE) 10 MG tablet TAKE 1 TABLET BY MOUTH DAILY WITH BREAKFAST 10 tablet 0    . PROAIR HFA 108 (90 Base) MCG/ACT inhaler INHALE 2 PUFFS BY MOUTH EVERY 6 HOURS AS NEEDED FOR WHEEZING OR SHORTNESS OF BREATH 8.5 g 11  . PROAIR HFA 108 (90 Base) MCG/ACT inhaler INHALE 2 PUFFS BY MOUTH EVERY 6 HOURS AS NEEDED FOR WHEEZING OR SHORTNESS OF BREATH 8.5 g 0  . rivaroxaban (XARELTO) 20 MG TABS tablet Take 1 tablet (20 mg total) by mouth every evening. 30 tablet 3  . sodium chloride (OCEAN) 0.65 % SOLN nasal spray Place 1 spray into both nostrils 4 (four) times daily as needed for congestion.    Marland Kitchen. tiotropium (SPIRIVA HANDIHALER) 18 MCG inhalation capsule INHALE THE CONTENTS OF 1 CAPSULE VIA INHALATION DEVICE EVERY DAY 30 capsule 5  . triamterene-hydrochlorothiazide (MAXZIDE-25) 37.5-25 MG tablet TAKE 1 TABLET BY MOUTH DAILY 30 tablet 0   No facility-administered medications prior to visit.     Review of Systems  Review of Systems  Constitutional: Negative.   HENT: Positive for congestion and postnasal drip.   Respiratory: Positive for cough and chest tightness. Negative for wheezing and stridor.   Cardiovascular: Negative.   Skin: Negative.     Physical Exam  BP 118/76 (BP Location: Left Arm, Cuff Size: Normal)   Pulse 87   Ht 6' (1.829 m)   Wt 225 lb (102.1 kg)   SpO2 96%   BMI 30.52 kg/m  Physical Exam  Constitutional: He is oriented to person, place, and time. Vital signs are normal. He appears well-developed and well-nourished.  Chronically ill appearing gentleman   HENT:  Head: Normocephalic and atraumatic.  Eyes: Pupils are equal, round, and reactive to light. EOM are normal.  Neck: Normal range of motion. Neck supple.  Cardiovascular: Normal rate, normal heart sounds and intact distal pulses. A regularly irregular rhythm present.  Pulmonary/Chest: Effort normal. No accessory muscle usage. No tachypnea. No respiratory distress. He has wheezes in the right upper field and the right lower field. He has no rhonchi. He has no rales.  Neurological: He is alert and  oriented to person, place, and time.  Skin: Skin is warm and dry.     Lab Results:  CBC    Component Value Date/Time   WBC 11.5 07/28/2017   WBC 10.8 (H) 07/19/2017 0248   RBC 3.55 (L) 07/19/2017 0248   HGB 11.4 (A) 07/28/2017   HCT 34 (A) 07/28/2017   PLT 490 (A) 07/28/2017   MCV 92.7 07/19/2017 0248   MCH 30.4 07/19/2017 0248   MCHC 32.8 07/19/2017 0248   RDW 14.1 07/19/2017 0248   LYMPHSABS 1.8 07/14/2017 1101   MONOABS 0.6 07/14/2017 1101   EOSABS 0.1 07/14/2017 1101   BASOSABS 0.0 07/14/2017 1101    BMET    Component Value Date/Time   NA 140 08/07/2017   K 4.5 08/07/2017   CL 100 (L) 07/21/2017 0813   CO2 27 07/21/2017 0813   GLUCOSE 111 (H) 07/21/2017 0813   BUN  12 08/07/2017   CREATININE 0.8 08/07/2017   CREATININE 0.80 07/21/2017 0813   CALCIUM 8.2 (L) 07/21/2017 0813   GFRNONAA >60 07/21/2017 0813   GFRAA >60 07/21/2017 0813    BNP No results found for: BNP  ProBNP    Component Value Date/Time   PROBNP 186.3 (H) 01/23/2014 1622    Imaging: No results found.   Assessment & Plan:  62 year old male, hx severe COPD with recurrent exacerbations. Patient is maintained on Symbicort and Spiriva. Tapering prednisone dose down, goal to slowly come off in the next 6 months if tolerates. Currently taking 10mg  alternating with 5mg  every other day for the last month. Breathing has not significantly changed. Some perceived "scratchy" cough and chest tightness on days with lower prednisone dose. No acute sob or colored sputum. He had previously been on azithromycin daily but patient self stopped d/t potential risk for cardiac arrhthymias. Records from visit with cardiology prior to left hip fracture noted patient to have paroxysmal afib and multifocal atrial tachycardia treated with diltiazem. They did not think azithromycin would cause problems. He is anticoagulated with xarelto. He is willing to reconsider starting medication again at next visit.    COPD GOLD IV    - Doing fairly well, continues Symbicort 160 and Spiriva  - Slowly tapering prednisone (5mg  prednisone daily, alternating with 10mg  every 3rd day) - Patient will consider re-starting azirthomycin at next vist, could also consider daliresp - Add singulair  - FU in 4-6 weeks     Glenford Bayley, NP 06/23/2018

## 2018-06-23 NOTE — Patient Instructions (Addendum)
Takes prednisone 5mg  x 2 days, then 10mg  on third day  (For example; Monday and Tuesday take 5mg , Wednesday take 10mg , thur and Friday take 5mg , etc)     Add singulair - will send prescription Continue mucinex twice a day and flonase daily  Continues vitamin D   FU in 4-6 weeks with Dr. Kendrick FriesMcQuaid   Notify office if breathing worsens or develop colored sputum

## 2018-06-28 NOTE — Progress Notes (Signed)
Reviewed, agree 

## 2018-06-29 ENCOUNTER — Other Ambulatory Visit: Payer: Self-pay | Admitting: Pulmonary Disease

## 2018-07-13 ENCOUNTER — Telehealth: Payer: Self-pay | Admitting: Pulmonary Disease

## 2018-07-13 MED ORDER — PREDNISONE 10 MG PO TABS: ORAL_TABLET | ORAL | 0 refills | Status: DC

## 2018-07-13 NOTE — Telephone Encounter (Signed)
Called and spoke to pt.  Pt is requesting refill on Prednisone.  Per last OV note- pt is to take prednisone 5mg  Monday and Tuesday ,10mg  on Wednesday, Thur and Friday take 5mg , etc It appears that last Rx for prednisone was sent in with only 10 tabs. Rx for prednisone 10mg  has been sent to preferred pharmacy. Nothing further is needed.

## 2018-07-13 NOTE — Telephone Encounter (Signed)
Spoke with Shanda Bumps. Advised her that per the patient's chart, he is to take 5mg  of prednisone for 2 days and then take 10mg  on the 3rd day. She verbalized understanding.   Nothing further needed at time of call.

## 2018-07-17 ENCOUNTER — Other Ambulatory Visit: Payer: Self-pay | Admitting: Pulmonary Disease

## 2018-07-28 ENCOUNTER — Ambulatory Visit: Payer: PPO | Admitting: Pulmonary Disease

## 2018-07-28 ENCOUNTER — Encounter: Payer: Self-pay | Admitting: Pulmonary Disease

## 2018-07-28 VITALS — BP 140/82 | HR 101 | Ht 75.39 in | Wt 221.6 lb

## 2018-07-28 DIAGNOSIS — Z23 Encounter for immunization: Secondary | ICD-10-CM | POA: Diagnosis not present

## 2018-07-28 DIAGNOSIS — J9610 Chronic respiratory failure, unspecified whether with hypoxia or hypercapnia: Secondary | ICD-10-CM

## 2018-07-28 DIAGNOSIS — J449 Chronic obstructive pulmonary disease, unspecified: Secondary | ICD-10-CM | POA: Diagnosis not present

## 2018-07-28 MED ORDER — PREDNISONE 5 MG PO TABS: ORAL_TABLET | ORAL | 2 refills | Status: DC

## 2018-07-28 NOTE — Addendum Note (Signed)
Addended by: Tana FeltsILLMAN, Raneisha Bress J on: 07/28/2018 10:42 AM   Modules accepted: Orders

## 2018-07-28 NOTE — Progress Notes (Signed)
Subjective:    Patient ID: Theodore [name], male    DOB: 12-Apr-1956, 62 y.o.   MRN: [id number]  Synopsis: Has stage IV COPD (steroid dependent) and history of a pulmonary embolism after a hospitalization in 2015 formerly followed by Dr. Sherene Sires.  - Smoked 3 packs a day at the most, decreased to 1 ppd for a "few years", he quit altogether since 2015 -second lifetime pulmomary embolism in January 2018 -Try daily azithromycin, stopped in early 2019 for his personal concern over risk of arrhythmia side effects  HPI Chief Complaint  Patient presents with  . Follow-up    shortness of breath, swelling in legs/feet   Fischer saw Waynetta Sandy a month ago and she prescribed singulair which helped nearly instantly.  He says that his sinuses opened up right away and his cough improved.  He says that he continues to take the singulair.  He says that his wheezing wheezing is much better since he has been using the Singulair.  He says that he has noticed that his oxygen level will go up and down, typically lower on days when he has more chest congestion.  This will clear up after he coughs up more mucus.  He says that he is taking prednisone 5 mg daily for 2 days, then 10 mg daily on the third day and continuing that pattern.  He will notice a bit more sinus congestion on the night of the second day of 5 mg.  He continues to take Symbicort and Spiriva.  He would like a flu shot.   Past Medical History:  Diagnosis Date  . Anxiety   . Arthritis    "back" (01/24/2014)  . Asthma   . Chronic bronchitis (HCC)   . Cluster headache    "last bout was summer 2014; had them q spring 1991-2001" (01/24/2014)  . COPD (chronic obstructive pulmonary disease) (HCC)   . GERD (gastroesophageal reflux disease)   . NSTEMI (non-ST elevated myocardial infarction) (HCC) 01/2014  . On home oxygen therapy    "2L; 24/7" (01/24/2014)  . Pneumonia 06/2013; 01/2014  . Pulmonary embolism (HCC) 01/23/2014  . Seasonal allergies       Review  of Systems  Constitutional: Positive for fatigue. Negative for chills and fever.  HENT: Negative for postnasal drip, rhinorrhea and sinus pressure.   Respiratory: Positive for chest tightness, shortness of breath and wheezing.   Cardiovascular: Positive for leg swelling. Negative for chest pain and palpitations.       Objective:   Physical Exam  Vitals:   07/28/18 0959  BP: 140/82  Pulse: (!) 101  SpO2: 94%  Weight: 221 lb 9.6 oz (100.5 kg)  Height: 6' 3.39" (1.915 m)   RA  Gen: chronically ill appearing HENT: OP clear, TM's clear, neck supple PULM: Wheezing bilaterally B, normal percussion CV: RRR, no mgr, trace edema GI: BS+, soft, nontender Derm: no cyanosis or rash Psyche: normal mood and affect   PFT - PFT's 12/02/2011  FEV1  0.94 (24%) ratio 39 -  18% response to B 2 and DLCO 60%  Lab  -Alpha 1 genotype sent 03/01/12 >  MM    Echo: January 2018 echocardiogram reviewed showing a normal LVEF but mild pulmonary hypertension  Records from his last visit with [place] reviewed where he was started on Singulair and prednisone was tapered further.     Assessment & Plan:     COPD GOLD IV   Chronic respiratory failure, unspecified whether with hypoxia or hypercapnia (HCC)  Discussion: Fayrene FearingJames is tolerating us very slow taper of prednisone.  Singulair was quite helpful.  Plan: Severe COPD with recurrent exacerbations: Continue Symbicort Continue Spiriva Flu shot today Practice good hand hygiene Stay active  Prednisone dependence: Continue taking prednisone 5 mg every day for 2 days then 10 mg on day 3, then resuming the pattern again. Continue this for 4 weeks, if you are feeling well then change to: 5 mg daily for 3 days then 10 mg on day 4, then resume 5 mg for 3 days and again take 10 mg on day for then continue the cycle   We will check your oxygen level today while walking  We will see you back in 8 weeks or sooner if needed   Current Outpatient  Medications:  .  albuterol (PROVENTIL) (2.5 MG/3ML) 0.083% nebulizer solution, Take 3 mLs (2.5 mg total) by nebulization every 6 (six) hours as needed for wheezing or shortness of breath., Disp: 75 mL, Rfl: 11 .  budesonide-formoterol (SYMBICORT) 160-4.5 MCG/ACT inhaler, INHALE 2 PUFFS FIRST THING IN THE MORNING AND ANOTHER 2 PUFFS ABOUT 12 HOURS LATER, Disp: 10.2 g, Rfl: 5 .  fluticasone (FLONASE) 50 MCG/ACT nasal spray, Place 1 spray into both nostrils daily., Disp: , Rfl:  .  guaiFENesin (MUCINEX) 600 MG 12 hr tablet, Take 1,200 mg by mouth 2 (two) times daily. , Disp: , Rfl:  .  montelukast (SINGULAIR) 10 MG tablet, Take 1 tablet (10 mg total) by mouth at bedtime., Disp: 30 tablet, Rfl: 11 .  nicotine polacrilex (NICORETTE) 4 MG gum, Take 4 mg by mouth daily as needed for smoking cessation., Disp: , Rfl:  .  OXYGEN, Inhale 2 L/min into the lungs as needed. For sats less than 90%, Disp: , Rfl:  .  predniSONE (DELTASONE) 10 MG tablet, Takes prednisone 5mg  x 2 days, then 10mg  on third day, Disp: 90 tablet, Rfl: 0 .  PROAIR HFA 108 (90 Base) MCG/ACT inhaler, INHALE 2 PUFFS BY MOUTH EVERY 6 HOURS AS NEEDED FOR WHEEZING OR SHORTNESS OF BREATH, Disp: 8.5 g, Rfl: 11 .  rivaroxaban (XARELTO) 20 MG TABS tablet, Take 1 tablet (20 mg total) by mouth every evening., Disp: 30 tablet, Rfl: 3 .  sodium chloride (OCEAN) 0.65 % SOLN nasal spray, Place 1 spray into both nostrils 4 (four) times daily as needed for congestion., Disp: , Rfl:  .  tiotropium (SPIRIVA HANDIHALER) 18 MCG inhalation capsule, INHALE THE CONTENTS OF 1 CAPSULE VIA INHALATION DEVICE EVERY DAY, Disp: 30 capsule, Rfl: 5 .  triamterene-hydrochlorothiazide (MAXZIDE-25) 37.5-25 MG tablet, TAKE 1 TABLET BY MOUTH DAILY, Disp: 30 tablet, Rfl: 0

## 2018-07-28 NOTE — Patient Instructions (Signed)
Severe COPD with recurrent exacerbations: Continue Symbicort Continue Spiriva Flu shot today Practice good hand hygiene Stay active  Prednisone dependence: Continue taking prednisone 5 mg every day for 2 days then 10 mg on day 3, then resuming the pattern again. Continue this for 4 weeks, if you are feeling well then change to: 5 mg daily for 3 days then 10 mg on day 4, then resume 5 mg for 3 days and again take 10 mg on day for then continue the cycle   We will check your oxygen level today while walking  We will see you back in 8 weeks or sooner if needed

## 2018-08-14 ENCOUNTER — Other Ambulatory Visit: Payer: Self-pay | Admitting: Pulmonary Disease

## 2018-08-15 ENCOUNTER — Other Ambulatory Visit: Payer: Self-pay | Admitting: Pulmonary Disease

## 2018-08-17 NOTE — Progress Notes (Signed)
Subjective:   Theodore [name] is a 62 y.o. male who presents for an Initial Medicare Annual Wellness Visit.  Review of Systems  No ROS.  Medicare Wellness Visit. Additional risk factors are reflected in the social history.  Cardiac Risk Factors include: advanced age (>85men, >38 women);dyslipidemia;hypertension;male gender;diabetes mellitus Sleep patterns: feels rested on waking, gets up 1-2 times nightly to void and sleeps varies nightly according to respiratory status.    Home Safety/Smoke Alarms: Feels safe in home. Smoke alarms in place.  Living environment; residence and Firearm Safety: 1-story house/ trailer, no firearms.Lives with mother, no needs for DME, limited support system.  Seat Belt Safety/Bike Helmet: Wears seat belt.   Objective:    Today's Vitals   08/18/18 1020  BP: (!) 144/82  Pulse: 71  Resp: (!) 21  Temp: 98 F (36.7 C)  SpO2: 98%  Weight: 225 lb (102.1 kg)  Height: 6\' 3"  (1.905 m)   Body mass index is 28.12 kg/m.  Advanced Directives 08/18/2018 08/17/2017 07/29/2017 07/26/2017 07/22/2017 07/14/2017 01/25/2017  Does Patient Have a Medical Advance Directive? No Yes Yes Yes Yes No No  Type of Advance Directive - Out of facility DNR (pink MOST or yellow form) Out of facility DNR (pink MOST or yellow form) Out of facility DNR (pink MOST or yellow form) Out of facility DNR (pink MOST or yellow form) - -  Does patient want to make changes to medical advance directive? - No - Patient declined No - Patient declined No - Patient declined No - Patient declined - -  Would patient like information on creating a medical advance directive? Yes (ED - Information included in AVS) No - Patient declined No - Patient declined No - Patient declined No - Patient declined No - Patient declined Yes (MAU/Ambulatory/Procedural Areas - Information given)  Pre-existing out of facility DNR order (yellow form or pink MOST form) - Yellow form placed in chart (order not valid for inpatient  use) Yellow form placed in chart (order not valid for inpatient use) Yellow form placed in chart (order not valid for inpatient use) Yellow form placed in chart (order not valid for inpatient use) - -    Current Medications (verified) Outpatient Encounter Medications as of 08/18/2018  Medication Sig  . albuterol (PROVENTIL) (2.5 MG/3ML) 0.083% nebulizer solution Take 3 mLs (2.5 mg total) by nebulization every 6 (six) hours as needed for wheezing or shortness of breath.  . budesonide-formoterol (SYMBICORT) 160-4.5 MCG/ACT inhaler INHALE 2 PUFFS FIRST THING IN THE MORNING AND ANOTHER 2 PUFFS ABOUT 12 HOURS LATER  . fluticasone (FLONASE) 50 MCG/ACT nasal spray Place 1 spray into both nostrils daily.  Marland Kitchen guaiFENesin (MUCINEX) 600 MG 12 hr tablet Take 1,200 mg by mouth 2 (two) times daily.   . montelukast (SINGULAIR) 10 MG tablet Take 1 tablet (10 mg total) by mouth at bedtime.  . Multiple Vitamin (MULTI VITAMIN PO) Take 1 tablet by mouth daily.  . nicotine polacrilex (NICORETTE) 4 MG gum Take 4 mg by mouth daily as needed for smoking cessation.  . OXYGEN Inhale 2 L/min into the lungs as needed. For sats less than 90%  . predniSONE (DELTASONE) 10 MG tablet Takes prednisone 5mg  x 2 days, then 10mg  on third day  . predniSONE (DELTASONE) 5 MG tablet Take 5mg  for 3 days, 10mg  for 1 day, 5mg  for 3 days, 10mg  1 day and continue  . PROAIR HFA 108 (90 Base) MCG/ACT inhaler INHALE 2 PUFFS BY MOUTH EVERY 6 HOURS AS  NEEDED FOR WHEEZING OR SHORTNESS OF BREATH  . rivaroxaban (XARELTO) 20 MG TABS tablet Take 1 tablet (20 mg total) by mouth every evening.  . sodium chloride (OCEAN) 0.65 % SOLN nasal spray Place 1 spray into both nostrils 4 (four) times daily as needed for congestion.  Marland Kitchen tiotropium (SPIRIVA HANDIHALER) 18 MCG inhalation capsule INHALE THE CONTENTS OF 1 CAPSULE VIA INHALATION DEVICE EVERY DAY  . [DISCONTINUED] triamterene-hydrochlorothiazide (MAXZIDE-25) 37.5-25 MG tablet TAKE 1 TABLET BY MOUTH DAILY     No facility-administered encounter medications on file as of 08/18/2018.     Allergies (verified) Naproxen sodium   History: Past Medical History:  Diagnosis Date  . Anxiety   . Arthritis    "back" (01/24/2014)  . Asthma   . Chronic bronchitis (HCC)   . Cluster headache    "last bout was summer 2014; had them q spring 1991-2001" (01/24/2014)  . COPD (chronic obstructive pulmonary disease) (HCC)   . GERD (gastroesophageal reflux disease)   . NSTEMI (non-ST elevated myocardial infarction) (HCC) 01/2014  . On home oxygen therapy    "2L; 24/7" (01/24/2014)  . Pneumonia 06/2013; 01/2014  . Pulmonary embolism (HCC) 01/23/2014  . Seasonal allergies    Past Surgical History:  Procedure Laterality Date  . COLONOSCOPY WITH PROPOFOL N/A 10/20/2016   Procedure: COLONOSCOPY WITH PROPOFOL;  Surgeon: Sherrilyn Rist, MD;  Location: [facility];  Service: Gastroenterology;  Laterality: N/A;  . NASAL SEPTOPLASTY W/ TURBINOPLASTY Left 1983  . TOTAL HIP ARTHROPLASTY Left 07/16/2017   Procedure: TOTAL HIP ARTHROPLASTY ANTERIOR APPROACH;  Surgeon: Tarry Kos, MD;  Location: [facility];  Service: Orthopedics;  Laterality: Left;  . TURBINATE REDUCTION Bilateral ~ 1983   Family History  Problem Relation Age of Onset  . Leukemia Father   . Heart disease Father   . Asthma Father   . Asthma Sister   . Emphysema Sister   . Heart disease Sister   . High blood pressure Mother   . Colon cancer Cousin        First cousin   Social History   Socioeconomic History  . Marital status: Divorced    Spouse name: Not on file  . Number of children: 0  . Years of education: 33  . Highest education level: Not on file  Occupational History  . Occupation: Disability    Employer: [employer]  Social Needs  . Financial resource strain: Somewhat hard  . Food insecurity:    Worry: Never true    Inability: Never true  . Transportation needs:    Medical: No    Non-medical: No  Tobacco Use  .  Smoking status: Former Smoker    Packs/day: 2.00    Years: 35.00    Pack years: 70.00    Types: Cigarettes    Last attempt to quit: 01/24/2012    Years since quitting: 6.5  . Smokeless tobacco: Never Used  . Tobacco comment: Remain smoke free  Substance and Sexual Activity  . Alcohol use: Yes    Comment: 01/24/2014 "holidays; family birthdays"  . Drug use: No  . Sexual activity: Never  Lifestyle  . Physical activity:    Days per week: 0 days    Minutes per session: 0 min  . Stress: Only a little  Relationships  . Social connections:    Talks on phone: More than three times a week    Gets together: More than three times a week    Attends religious service: Never  Active member of club or organization: No    Attends meetings of clubs or organizations: Never    Relationship status: Not on file  Other Topics Concern  . Not on file  Social History Narrative   Born in [place], [place] and moved around a lot. Dad was an Art gallery manager and moved frequently.      Tobacco Counseling Counseling given: Not Answered Comment: Remain smoke free  Activities of Daily Living In your present state of health, do you have any difficulty performing the following activities: 08/18/2018  Hearing? N  Vision? N  Difficulty concentrating or making decisions? N  Walking or climbing stairs? Y  Dressing or bathing? N  Doing errands, shopping? Y  Preparing Food and eating ? Y  Using the Toilet? N  In the past six months, have you accidently leaked urine? N  Do you have problems with loss of bowel control? N  Managing your Medications? N  Managing your Finances? N  Housekeeping or managing your Housekeeping? Y  Some recent data might be hidden     Immunizations and Health Maintenance Immunization History  Administered Date(s) Administered  . Influenza Split 08/10/2011, 07/27/2012  . Influenza,inj,Quad PF,6+ Mos 07/14/2013, 08/06/2014, 10/17/2015, 07/21/2016, 08/26/2017, 07/28/2018  . PPD Test  07/22/2017, 07/29/2017  . Pneumococcal Conjugate-13 08/06/2014  . Pneumococcal Polysaccharide-23 02/05/2012  . Tdap 09/03/2014, 07/14/2017   Health Maintenance Due  Topic Date Due  . OPHTHALMOLOGY EXAM  10/09/2016  . URINE MICROALBUMIN  04/30/2017  . HEMOGLOBIN A1C  01/24/2018    Patient Care Team: Evaristo Bury, NP as PCP - General (Nurse Practitioner) Frederica Kuster, MD as Referring Physician (Family Medicine) Lupita Leash, MD as Consulting Physician (Pulmonary Disease)  Indicate any recent Medical Services you may have received from other than [facility] providers in the past year (date may be approximate).    Assessment:   This is a routine wellness examination for Canio. Physical assessment deferred to PCP.   Hearing/Vision screen  Visual Acuity Screening   Right eye Left eye Both eyes  Without correction: 20/40 20/40 20/40   With correction:     Comments: Education provide about retinopathy. Ophthalmology referral requested.  Hearing Screening Comments: Able to hear conversational tones w/o difficulty. No issues reported.    Dietary issues and exercise activities discussed: Current Exercise Habits: The patient does not participate in regular exercise at present, Exercise limited by: respiratory conditions(s);orthopedic condition(s)  Diet (meal preparation, eat out, water intake, caffeinated beverages, dairy products, fruits and vegetables): in general, a "healthy" diet     Reviewed heart healthy and diabetic diet. Encouraged patient to increase daily water and healthy fluid intake.  Goals    . Patient Stated     I would like to start to do my short track. I will do a couple laps every now and then and  build up from there.       Depression Screen PHQ 2/9 Scores 08/18/2018 01/25/2017 04/30/2016  PHQ - 2 Score 3 5 0  PHQ- 9 Score 7 12 -    Fall Risk Fall Risk  08/18/2018 01/25/2017 04/30/2016  Falls in the past year? No No No   Cognitive Function:         Ad8 score reviewed for issues:  Issues making decisions: no  Less interest in hobbies / activities: no  Repeats questions, stories (family complaining): no  Trouble using ordinary gadgets (microwave, computer, phone):no  Forgets the month or year: no  Mismanaging finances: no  Remembering  appts: no  Daily problems with thinking and/or memory: no Ad8 score is= 0  Screening Tests Health Maintenance  Topic Date Due  . OPHTHALMOLOGY EXAM  10/09/2016  . URINE MICROALBUMIN  04/30/2017  . HEMOGLOBIN A1C  01/24/2018  . FOOT EXAM  08/19/2019  . COLONOSCOPY  10/20/2026  . TETANUS/TDAP  07/15/2027  . INFLUENZA VACCINE  Completed  . Hepatitis C Screening  Completed  . HIV Screening  Completed      Plan:     Continue to eat heart healthy diet (full of fruits, vegetables, whole grains, lean protein, water--limit salt, fat, and sugar intake) and increase physical activity as tolerated.  Continue doing brain stimulating activities (puzzles, reading, adult coloring books, staying active) to keep memory sharp.   I have personally reviewed and noted the following in the patient's chart:   . Medical and social history . Use of alcohol, tobacco or illicit drugs  . Current medications and supplements . Functional ability and status . Nutritional status . Physical activity . Advanced directives . List of other physicians . Vitals . Screenings to include cognitive, depression, and falls . Referrals and appointments  In addition, I have reviewed and discussed with patient certain preventive protocols, quality metrics, and best practice recommendations. A written personalized care plan for preventive services as well as general preventive health recommendations were provided to patient.     Wanda Plump, RN   08/18/2018

## 2018-08-18 ENCOUNTER — Encounter: Payer: Self-pay | Admitting: Nurse Practitioner

## 2018-08-18 ENCOUNTER — Ambulatory Visit (INDEPENDENT_AMBULATORY_CARE_PROVIDER_SITE_OTHER): Payer: PPO | Admitting: *Deleted

## 2018-08-18 ENCOUNTER — Other Ambulatory Visit (INDEPENDENT_AMBULATORY_CARE_PROVIDER_SITE_OTHER): Payer: PPO

## 2018-08-18 ENCOUNTER — Ambulatory Visit (INDEPENDENT_AMBULATORY_CARE_PROVIDER_SITE_OTHER): Payer: PPO | Admitting: Nurse Practitioner

## 2018-08-18 VITALS — BP 140/82 | HR 71 | Resp 21 | Ht 75.0 in | Wt 225.0 lb

## 2018-08-18 VITALS — BP 144/82 | HR 71 | Temp 98.0°F | Resp 21 | Ht 75.0 in | Wt 225.0 lb

## 2018-08-18 DIAGNOSIS — Z0000 Encounter for general adult medical examination without abnormal findings: Secondary | ICD-10-CM

## 2018-08-18 DIAGNOSIS — R7303 Prediabetes: Secondary | ICD-10-CM | POA: Diagnosis not present

## 2018-08-18 DIAGNOSIS — Z0001 Encounter for general adult medical examination with abnormal findings: Secondary | ICD-10-CM

## 2018-08-18 DIAGNOSIS — H539 Unspecified visual disturbance: Secondary | ICD-10-CM | POA: Diagnosis not present

## 2018-08-18 DIAGNOSIS — I10 Essential (primary) hypertension: Secondary | ICD-10-CM

## 2018-08-18 DIAGNOSIS — E119 Type 2 diabetes mellitus without complications: Secondary | ICD-10-CM

## 2018-08-18 DIAGNOSIS — Z125 Encounter for screening for malignant neoplasm of prostate: Secondary | ICD-10-CM

## 2018-08-18 DIAGNOSIS — F329 Major depressive disorder, single episode, unspecified: Secondary | ICD-10-CM

## 2018-08-18 DIAGNOSIS — R600 Localized edema: Secondary | ICD-10-CM

## 2018-08-18 DIAGNOSIS — L608 Other nail disorders: Secondary | ICD-10-CM | POA: Diagnosis not present

## 2018-08-18 DIAGNOSIS — J449 Chronic obstructive pulmonary disease, unspecified: Secondary | ICD-10-CM | POA: Diagnosis not present

## 2018-08-18 DIAGNOSIS — F32A Depression, unspecified: Secondary | ICD-10-CM

## 2018-08-18 LAB — COMPREHENSIVE METABOLIC PANEL
ALT: 17 U/L (ref 0–53)
AST: 16 U/L (ref 0–37)
Albumin: 4.1 g/dL (ref 3.5–5.2)
Alkaline Phosphatase: 55 U/L (ref 39–117)
BUN: 12 mg/dL (ref 6–23)
CHLORIDE: 93 meq/L — AB (ref 96–112)
CO2: 30 meq/L (ref 19–32)
CREATININE: 0.93 mg/dL (ref 0.40–1.50)
Calcium: 9.4 mg/dL (ref 8.4–10.5)
GFR: 86.85 mL/min (ref >60.00)
Glucose, Bld: 86 mg/dL (ref 70–99)
POTASSIUM: 4.1 meq/L (ref 3.5–5.1)
SODIUM: 132 meq/L — AB (ref 135–145)
Total Bilirubin: 0.6 mg/dL (ref 0.2–1.2)
Total Protein: 7 g/dL (ref 6.0–8.3)

## 2018-08-18 LAB — LIPID PANEL
CHOL/HDL RATIO: 3
Cholesterol: 215 mg/dL — ABNORMAL HIGH (ref 0–200)
HDL: 68.1 mg/dL (ref >39.00)
LDL Cholesterol: 126 mg/dL — ABNORMAL HIGH (ref 0–99)
NonHDL: 146.6
Triglycerides: 103 mg/dL (ref 0.0–149.0)
VLDL: 20.6 mg/dL (ref 0.0–40.0)

## 2018-08-18 LAB — BRAIN NATRIURETIC PEPTIDE: PRO B NATRI PEPTIDE: 38 pg/mL (ref 0.0–100.0)

## 2018-08-18 LAB — PSA: PSA: 0.74 ng/mL (ref 0.10–4.00)

## 2018-08-18 LAB — HEMOGLOBIN A1C: Hgb A1c MFr Bld: 5.6 % (ref 4.6–6.5)

## 2018-08-18 LAB — CBC
HEMATOCRIT: 44.5 % (ref 39.0–52.0)
Hemoglobin: 14.9 g/dL (ref 13.0–17.0)
MCHC: 33.5 g/dL (ref 30.0–36.0)
MCV: 94.3 fl (ref 78.0–100.0)
PLATELETS: 423 10*3/uL — AB (ref 150.0–400.0)
RBC: 4.72 Mil/uL (ref 4.22–5.81)
RDW: 13.9 % (ref 11.5–15.5)
WBC: 11.1 10*3/uL — ABNORMAL HIGH (ref 4.0–10.5)

## 2018-08-18 MED ORDER — TRIAMTERENE-HCTZ 37.5-25 MG PO TABS: 1.0000 | ORAL_TABLET | Freq: Every day | ORAL | 0 refills | Status: DC

## 2018-08-18 NOTE — Progress Notes (Signed)
Theodore [name] is a 62 y.o. male with the following history as recorded in [employer]:  Patient Active Problem List   Diagnosis Date Noted  . Trochanteric bursitis, left hip 11/12/2017  . Allergic rhinitis 10/11/2017  . Constipation due to opioid therapy 08/06/2017  . Hyperglycemia 07/26/2017  . Recurrent pulmonary emboli (HCC) 07/26/2017  . History of MI (myocardial infarction) 07/26/2017  . History of total hip replacement, left 07/26/2017  . Chronic respiratory failure with hypoxia (HCC) 07/16/2017  . Left displaced femoral neck fracture (HCC) 07/14/2017  . Closed displaced fracture of left femoral neck (HCC) 07/14/2017  . Hypokalemia   . Cardiac arrhythmia 07/06/2017  . Costochondral chest pain 12/28/2016  . Bilateral lower extremity edema 12/28/2016  . Chronic anticoagulation 12/23/2016  . DOE (dyspnea on exertion) 12/03/2016  . Chest pain 12/03/2016  . Anxiety state 10/30/2015  . Back pain 09/03/2014  . HBP (high blood pressure) 01/29/2014  . CAD (coronary artery disease) 01/23/2014  . Chronic respiratory failure assoc with cor pulmonale 01/20/2014  . Acute cor pulmonale (HCC) 01/12/2014  . DM (diabetes mellitus) (HCC) 01/10/2014  . Multiple lung nodules 10/24/2013  . Smoking 03/03/2012  . COPD GOLD IV  09/28/2011    Current Outpatient Medications  Medication Sig Dispense Refill  . albuterol (PROVENTIL) (2.5 MG/3ML) 0.083% nebulizer solution Take 3 mLs (2.5 mg total) by nebulization every 6 (six) hours as needed for wheezing or shortness of breath. 75 mL 11  . budesonide-formoterol (SYMBICORT) 160-4.5 MCG/ACT inhaler INHALE 2 PUFFS FIRST THING IN THE MORNING AND ANOTHER 2 PUFFS ABOUT 12 HOURS LATER 10.2 g 5  . fluticasone (FLONASE) 50 MCG/ACT nasal spray Place 1 spray into both nostrils daily.    Marland Kitchen guaiFENesin (MUCINEX) 600 MG 12 hr tablet Take 1,200 mg by mouth 2 (two) times daily.     . montelukast (SINGULAIR) 10 MG tablet Take 1 tablet (10 mg total) by mouth at  bedtime. 30 tablet 11  . Multiple Vitamin (MULTI VITAMIN PO) Take 1 tablet by mouth daily.    . nicotine polacrilex (NICORETTE) 4 MG gum Take 4 mg by mouth daily as needed for smoking cessation.    . OXYGEN Inhale 2 L/min into the lungs as needed. For sats less than 90%    . predniSONE (DELTASONE) 10 MG tablet Takes prednisone 5mg  x 2 days, then 10mg  on third day 90 tablet 0  . predniSONE (DELTASONE) 5 MG tablet Take 5mg  for 3 days, 10mg  for 1 day, 5mg  for 3 days, 10mg  1 day and continue 128 tablet 2  . PROAIR HFA 108 (90 Base) MCG/ACT inhaler INHALE 2 PUFFS BY MOUTH EVERY 6 HOURS AS NEEDED FOR WHEEZING OR SHORTNESS OF BREATH 8.5 g 11  . rivaroxaban (XARELTO) 20 MG TABS tablet Take 1 tablet (20 mg total) by mouth every evening. 30 tablet 3  . sodium chloride (OCEAN) 0.65 % SOLN nasal spray Place 1 spray into both nostrils 4 (four) times daily as needed for congestion.    Marland Kitchen tiotropium (SPIRIVA HANDIHALER) 18 MCG inhalation capsule INHALE THE CONTENTS OF 1 CAPSULE VIA INHALATION DEVICE EVERY DAY 30 capsule 5  . triamterene-hydrochlorothiazide (MAXZIDE-25) 37.5-25 MG tablet Take 1 tablet by mouth daily. 30 tablet 0   No current facility-administered medications for this visit.     Allergies: Naproxen sodium  Past Medical History:  Diagnosis Date  . Anxiety   . Arthritis    "back" (01/24/2014)  . Asthma   . Chronic bronchitis (HCC)   . Cluster  headache    "last bout was summer 2014; had them q spring 1991-2001" (01/24/2014)  . COPD (chronic obstructive pulmonary disease) (HCC)   . GERD (gastroesophageal reflux disease)   . NSTEMI (non-ST elevated myocardial infarction) (HCC) 01/2014  . On home oxygen therapy    "2L; 24/7" (01/24/2014)  . Pneumonia 06/2013; 01/2014  . Pulmonary embolism (HCC) 01/23/2014  . Seasonal allergies     Past Surgical History:  Procedure Laterality Date  . COLONOSCOPY WITH PROPOFOL N/A 10/20/2016   Procedure: COLONOSCOPY WITH PROPOFOL;  Surgeon: Sherrilyn Rist, MD;   Location: [facility];  Service: Gastroenterology;  Laterality: N/A;  . NASAL SEPTOPLASTY W/ TURBINOPLASTY Left 1983  . TOTAL HIP ARTHROPLASTY Left 07/16/2017   Procedure: TOTAL HIP ARTHROPLASTY ANTERIOR APPROACH;  Surgeon: Tarry Kos, MD;  Location: [facility];  Service: Orthopedics;  Laterality: Left;  . TURBINATE REDUCTION Bilateral ~ 1983    Family History  Problem Relation Age of Onset  . Leukemia Father   . Heart disease Father   . Asthma Father   . Asthma Sister   . Emphysema Sister   . Heart disease Sister   . High blood pressure Mother   . Colon cancer Cousin        First cousin    Social History   Tobacco Use  . Smoking status: Former Smoker    Packs/day: 2.00    Years: 35.00    Pack years: 70.00    Types: Cigarettes    Last attempt to quit: 01/24/2012    Years since quitting: 6.5  . Smokeless tobacco: Never Used  . Tobacco comment: Remain smoke free  Substance Use Topics  . Alcohol use: Yes    Comment: 01/24/2014 "holidays; family birthdays"     Subjective:  Theodore [name] is here today for his annual physical. He Would like referral to podiatry for evaluation of thickened deformed toenails, and ophthalmology for eyeglasses  CPE-  Last dental exam: no routine dental care Last vision exam: no recent ophthalmology care PSA: ordered today Lung cancer screening- former smoker, quit about 15 years ago, sees pulmonology for COPD Colonoscopy: up to date Lipids: lipid panel today DM screening - hx pre-diabetes- will update A1c today Vaccinations: up to date Diet and exercise: no routine exercise, no regular diet  Hypertension -maintained on triamterene-HCTZ 37.5-25 daily Reports daily medication compliance without adverse medication effects.  BP Readings from Last 3 Encounters:  08/18/18 140/82  08/18/18 (!) 144/82  07/28/18 140/82     Review of Systems  Constitutional: Negative for chills and fever.  HENT: Negative for hearing loss.   Eyes: Negative for  double vision.  Respiratory: Positive for cough and shortness of breath.   Cardiovascular: Positive for leg swelling. Negative for chest pain and palpitations.  Gastrointestinal: Negative for constipation, diarrhea and heartburn.  Genitourinary: Negative for dysuria and hematuria.  Musculoskeletal: Negative for falls.  Skin: Negative for itching and rash.  Neurological: Negative for speech change, loss of consciousness and headaches.  Endo/Heme/Allergies: Does not bruise/bleed easily.  Psychiatric/Behavioral: Positive for depression. The patient is not nervous/anxious.    Depression- Not a new problem, says he has felt depressed for quite some time, however depression is better than it used to be Denies SI, HI  BLE Edema- This is not a new problem, he reports long history of edema, does seem his edema was slightly worse over past several weeks, however has been better this week.   Objective:  Vitals:   08/18/18  1104 08/18/18 1138  BP: (!) 144/82 140/82  Pulse: 71   Resp: (!) 21   SpO2: 98%   Weight: 225 lb (102.1 kg)   Height: 6\' 3"  (1.905 m)     Wt Readings from Last 3 Encounters:  08/18/18 225 lb (102.1 kg)  08/18/18 225 lb (102.1 kg)  07/28/18 221 lb 9.6 oz (100.5 kg)   General: Well developed, chronically ill appearing Skin : Warm and dry.  Head: Normocephalic and atraumatic  Eyes: Sclera and conjunctiva clear; pupils round and reactive to light; extraocular movements intact  Ears: External normal; canals clear; tympanic membranes normal  Oropharynx: Pink, supple. No suspicious lesions  Neck: Supple without thyromegaly, adenopathy  Lungs: Respirations unlabored; no distress CVS exam: normal rate, regular rhythm, normal S1, S2, no murmurs, rubs, clicks or gallops.  Abdomen: Soft; nontender; nondistended; no masses or hepatosplenomegaly  Musculoskeletal: No deformities; no active joint inflammation  Extremities: No cyanosis, 1+edema bilaterally  Vessels: Symmetric  bilaterally  Neurologic: Alert and oriented; speech intact; face symmetrical; moves all extremities well; CNII-XII intact without focal deficit   Assessment:  1. Encounter for general adult medical examination with abnormal findings   2. Bilateral lower extremity edema   3. Essential hypertension   4. Toenail deformity   5. Vision abnormalities   6. Pre-diabetes   7. Screening for prostate cancer   8. COPD GOLD IV    9. Depression, unspecified depression type     Plan:   Return in about 6 months (around 02/17/2019) for routine follow up of chronic conditions, HTN.  Orders Placed This Encounter  Procedures  . CBC    Standing Status:   Future    Number of Occurrences:   1    Standing Expiration Date:   08/19/2019  . Lipid panel    Standing Status:   Future    Number of Occurrences:   1    Standing Expiration Date:   08/19/2019  . PSA    Standing Status:   Future    Number of Occurrences:   1    Standing Expiration Date:   08/19/2019  . Comprehensive metabolic panel    Standing Status:   Future    Number of Occurrences:   1    Standing Expiration Date:   08/19/2019  . Hemoglobin A1c    Standing Status:   Future    Number of Occurrences:   1    Standing Expiration Date:   08/19/2019  . B Nat Peptide    Standing Status:   Future    Number of Occurrences:   1    Standing Expiration Date:   08/18/2019  . Ambulatory referral to Ophthalmology    Referral Priority:   Routine    Referral Type:   Consultation    Referral Reason:   Specialty Services Required    Requested Specialty:   Ophthalmology    Number of Visits Requested:   1  . Ambulatory referral to Podiatry    Referral Priority:   Routine    Referral Type:   Consultation    Referral Reason:   Specialty Services Required    Requested Specialty:   Podiatry    Number of Visits Requested:   1    Requested Prescriptions   Signed Prescriptions Disp Refills  . triamterene-hydrochlorothiazide (MAXZIDE-25) 37.5-25 MG  tablet 30 tablet 0    Sig: Take 1 tablet by mouth daily.     Toenail deformity- Ambulatory referral to Podiatry  Vision  abnormalities- Ambulatory referral to Ophthalmology

## 2018-08-18 NOTE — Patient Instructions (Addendum)
[facility] in [place], [place] in: [place] Address: [address] [place], [place] [address] Phone: [phone]  Continue to eat heart healthy diet (full of fruits, vegetables, whole grains, lean protein, water--limit salt, fat, and sugar intake) and increase physical activity as tolerated.  Continue doing brain stimulating activities (puzzles, reading, adult coloring books, staying active) to keep memory sharp.    [name] , Thank you for taking time to come for your Medicare Wellness Visit. I appreciate your ongoing commitment to your health goals. Please review the following plan we discussed and let me know if I can assist you in the future.   These are the goals we discussed: Goals    . Patient Stated     I would like to start to do my short track. I will do a couple laps every now and then and  build up from there.        This is a list of the screening recommended for you and due dates:  Health Maintenance  Topic Date Due  . Eye exam for diabetics  10/09/2016  . Urine Protein Check  04/30/2017  . Hemoglobin A1C  01/24/2018  . Complete foot exam   08/19/2019  . Colon Cancer Screening  10/20/2026  . Tetanus Vaccine  07/15/2027  . Flu Shot  Completed  .  Hepatitis C: One time screening is recommended by Center for Disease Control  (CDC) for  adults born from [birth year] through 1967.   Completed  . HIV Screening  Completed     Health Maintenance, Male A healthy lifestyle and preventive care is important for your health and wellness. Ask your health care provider about what schedule of regular examinations is right for you. What should I know about weight and diet? Eat a Healthy Diet  Eat plenty of vegetables, fruits, whole grains, low-fat dairy products, and lean protein.  Do not eat a lot of foods high in solid fats, added sugars, or salt.  Maintain a Healthy Weight Regular exercise  can help you achieve or maintain a healthy weight. You should:  Do at least 150 minutes of exercise each week. The exercise should increase your heart rate and make you sweat (moderate-intensity exercise).  Do strength-training exercises at least twice a week.  Watch Your Levels of Cholesterol and Blood Lipids  Have your blood tested for lipids and cholesterol every 5 years starting at 64 years of age. If you are at high risk for heart disease, you should start having your blood tested when you are 64 years old. You may need to have your cholesterol levels checked more often if: ? Your lipid or cholesterol levels are high. ? You are older than 64 years of age. ? You are at high risk for heart disease.  What should I know about cancer screening? Many types of cancers can be detected early and may often be prevented. Lung Cancer  You should be screened every year for lung cancer if: ? You are a current smoker who has smoked for at least 30 years. ? You are a former smoker who has quit within the past 15 years.  Talk to your health care provider about your screening options, when you should start screening, and how often you should be screened.  Colorectal Cancer  Routine colorectal cancer screening usually begins at 64 years of age and should be repeated every 5-10 years until you are 64  years old. You may need to be screened more often if early forms of precancerous polyps or small growths are found. Your health care provider may recommend screening at an earlier age if you have risk factors for colon cancer.  Your health care provider may recommend using home test kits to check for hidden blood in the stool.  A small camera at the end of a tube can be used to examine your colon (sigmoidoscopy or colonoscopy). This checks for the earliest forms of colorectal cancer.  Prostate and Testicular Cancer  Depending on your age and overall health, your health care provider may do certain  tests to screen for prostate and testicular cancer.  Talk to your health care provider about any symptoms or concerns you have about testicular or prostate cancer.  Skin Cancer  Check your skin from head to toe regularly.  Tell your health care provider about any new moles or changes in moles, especially if: ? There is a change in a mole's size, shape, or color. ? You have a mole that is larger than a pencil eraser.  Always use sunscreen. Apply sunscreen liberally and repeat throughout the day.  Protect yourself by wearing long sleeves, pants, a wide-brimmed hat, and sunglasses when outside.  What should I know about heart disease, diabetes, and high blood pressure?  If you are 89-65 years of age, have your blood pressure checked every 3-5 years. If you are 49 years of age or older, have your blood pressure checked every year. You should have your blood pressure measured twice-once when you are at a hospital or clinic, and once when you are not at a hospital or clinic. Record the average of the two measurements. To check your blood pressure when you are not at a hospital or clinic, you can use: ? An automated blood pressure machine at a pharmacy. ? A home blood pressure monitor.  Talk to your health care provider about your target blood pressure.  If you are between 36-85 years old, ask your health care provider if you should take aspirin to prevent heart disease.  Have regular diabetes screenings by checking your fasting blood sugar level. ? If you are at a normal weight and have a low risk for diabetes, have this test once every three years after the age of 49. ? If you are overweight and have a high risk for diabetes, consider being tested at a younger age or more often.  A one-time screening for abdominal aortic aneurysm (AAA) by ultrasound is recommended for men aged 65-75 years who are current or former smokers. What should I know about preventing infection? Hepatitis B If you  have a higher risk for hepatitis B, you should be screened for this virus. Talk with your health care provider to find out if you are at risk for hepatitis B infection. Hepatitis C Blood testing is recommended for:  Everyone born from [birth year] through 1967.  Anyone with known risk factors for hepatitis C.  Sexually Transmitted Diseases (STDs)  You should be screened each year for STDs including gonorrhea and chlamydia if: ? You are sexually active and are younger than 64 years of age. ? You are older than 64 years of age and your health care provider tells you that you are at risk for this type of infection. ? Your sexual activity has changed since you were last screened and you are at an increased risk for chlamydia or gonorrhea. Ask your health care provider if you are  at risk.  Talk with your health care provider about whether you are at high risk of being infected with HIV. Your health care provider may recommend a prescription medicine to help prevent HIV infection.  What else can I do?  Schedule regular health, dental, and eye exams.  Stay current with your vaccines (immunizations).  Do not use any tobacco products, such as cigarettes, chewing tobacco, and e-cigarettes. If you need help quitting, ask your health care provider.  Limit alcohol intake to no more than 2 drinks per day. One drink equals 12 ounces of beer, 5 ounces of Alira Fretwell, or 1 ounces of hard liquor.  Do not use street drugs.  Do not share needles.  Ask your health care provider for help if you need support or information about quitting drugs.  Tell your health care provider if you often feel depressed.  Tell your health care provider if you have ever been abused or do not feel safe at home. This information is not intended to replace advice given to you by your health care provider. Make sure you discuss any questions you have with your health care provider. Document Released: 04/23/2008 Document Revised:  06/24/2016 Document Reviewed: 07/30/2015 Elsevier Interactive Patient Education  [employer].

## 2018-08-18 NOTE — Patient Instructions (Addendum)
Please head downstairs for lab work If any of your test results are critically abnormal, you will be contacted right away. Otherwise, I will contact you as soon as possible with any changes or recommendations.   Health Maintenance, Male A healthy lifestyle and preventive care is important for your health and wellness. Ask your health care provider about what schedule of regular examinations is right for you. What should I know about weight and diet? Eat a Healthy Diet  Eat plenty of vegetables, fruits, whole grains, low-fat dairy products, and lean protein.  Do not eat a lot of foods high in solid fats, added sugars, or salt.  Maintain a Healthy Weight Regular exercise can help you achieve or maintain a healthy weight. You should:  Do at least 150 minutes of exercise each week. The exercise should increase your heart rate and make you sweat (moderate-intensity exercise).  Do strength-training exercises at least twice a week.  Watch Your Levels of Cholesterol and Blood Lipids  Have your blood tested for lipids and cholesterol every 5 years starting at 64 years of age. If you are at high risk for heart disease, you should start having your blood tested when you are 64 years old. You may need to have your cholesterol levels checked more often if: ? Your lipid or cholesterol levels are high. ? You are older than 64 years of age. ? You are at high risk for heart disease.  What should I know about cancer screening? Many types of cancers can be detected early and may often be prevented. Lung Cancer  You should be screened every year for lung cancer if: ? You are a current smoker who has smoked for at least 30 years. ? You are a former smoker who has quit within the past 15 years.  Talk to your health care provider about your screening options, when you should start screening, and how often you should be screened.  Colorectal Cancer  Routine colorectal cancer screening usually begins  at 64 years of age and should be repeated every 5-10 years until you are 64 years old. You may need to be screened more often if early forms of precancerous polyps or small growths are found. Your health care provider may recommend screening at an earlier age if you have risk factors for colon cancer.  Your health care provider may recommend using home test kits to check for hidden blood in the stool.  A small camera at the end of a tube can be used to examine your colon (sigmoidoscopy or colonoscopy). This checks for the earliest forms of colorectal cancer.  Prostate and Testicular Cancer  Depending on your age and overall health, your health care provider may do certain tests to screen for prostate and testicular cancer.  Talk to your health care provider about any symptoms or concerns you have about testicular or prostate cancer.  Skin Cancer  Check your skin from head to toe regularly.  Tell your health care provider about any new moles or changes in moles, especially if: ? There is a change in a mole's size, shape, or color. ? You have a mole that is larger than a pencil eraser.  Always use sunscreen. Apply sunscreen liberally and repeat throughout the day.  Protect yourself by wearing long sleeves, pants, a wide-brimmed hat, and sunglasses when outside.  What should I know about heart disease, diabetes, and high blood pressure?  If you are 60-51 years of age, have your blood pressure checked every  3-5 years. If you are 36 years of age or older, have your blood pressure checked every year. You should have your blood pressure measured twice-once when you are at a hospital or clinic, and once when you are not at a hospital or clinic. Record the average of the two measurements. To check your blood pressure when you are not at a hospital or clinic, you can use: ? An automated blood pressure machine at a pharmacy. ? A home blood pressure monitor.  Talk to your health care provider about  your target blood pressure.  If you are between 62-76 years old, ask your health care provider if you should take aspirin to prevent heart disease.  Have regular diabetes screenings by checking your fasting blood sugar level. ? If you are at a normal weight and have a low risk for diabetes, have this test once every three years after the age of 61. ? If you are overweight and have a high risk for diabetes, consider being tested at a younger age or more often.  A one-time screening for abdominal aortic aneurysm (AAA) by ultrasound is recommended for men aged 65-75 years who are current or former smokers. What should I know about preventing infection? Hepatitis B If you have a higher risk for hepatitis B, you should be screened for this virus. Talk with your health care provider to find out if you are at risk for hepatitis B infection. Hepatitis C Blood testing is recommended for:  Everyone born from [birth year] through 1967.  Anyone with known risk factors for hepatitis C.  Sexually Transmitted Diseases (STDs)  You should be screened each year for STDs including gonorrhea and chlamydia if: ? You are sexually active and are younger than 64 years of age. ? You are older than 64 years of age and your health care provider tells you that you are at risk for this type of infection. ? Your sexual activity has changed since you were last screened and you are at an increased risk for chlamydia or gonorrhea. Ask your health care provider if you are at risk.  Talk with your health care provider about whether you are at high risk of being infected with HIV. Your health care provider may recommend a prescription medicine to help prevent HIV infection.  What else can I do?  Schedule regular health, dental, and eye exams.  Stay current with your vaccines (immunizations).  Do not use any tobacco products, such as cigarettes, chewing tobacco, and e-cigarettes. If you need help quitting, ask your health care  provider.  Limit alcohol intake to no more than 2 drinks per day. One drink equals 12 ounces of beer, 5 ounces of wine, or 1 ounces of hard liquor.  Do not use street drugs.  Do not share needles.  Ask your health care provider for help if you need support or information about quitting drugs.  Tell your health care provider if you often feel depressed.  Tell your health care provider if you have ever been abused or do not feel safe at home. This information is not intended to replace advice given to you by your health care provider. Make sure you discuss any questions you have with your health care provider. Document Released: 04/23/2008 Document Revised: 06/24/2016 Document Reviewed: 07/30/2015 Elsevier Interactive Patient Education  [employer].

## 2018-08-22 ENCOUNTER — Encounter: Payer: Self-pay | Admitting: Nurse Practitioner

## 2018-08-22 DIAGNOSIS — F329 Major depressive disorder, single episode, unspecified: Secondary | ICD-10-CM | POA: Insufficient documentation

## 2018-08-22 DIAGNOSIS — R7303 Prediabetes: Secondary | ICD-10-CM | POA: Insufficient documentation

## 2018-08-22 DIAGNOSIS — F32A Depression, unspecified: Secondary | ICD-10-CM | POA: Insufficient documentation

## 2018-08-22 NOTE — Assessment & Plan Note (Addendum)
Stable Continue current medications Continue to monitor - CBC; Future - Lipid panel; Future - Comprehensive metabolic panel; Future - triamterene-hydrochlorothiazide (MAXZIDE-25) 37.5-25 MG tablet; Take 1 tablet by mouth daily.  Dispense: 30 tablet; Refill: 0

## 2018-08-22 NOTE — Assessment & Plan Note (Signed)
Continue regular follow up with pulmonology 

## 2018-08-22 NOTE — Progress Notes (Signed)
Medical screening examination/treatment/procedure(s) were performed by the Wellness Coach, RN. As primary care provider I was immediately available for consulation/collaboration. I agree with above documentation. Jori Frerichs, NP  

## 2018-08-22 NOTE — Assessment & Plan Note (Signed)
Update labs - Comprehensive metabolic panel; Future - Hemoglobin A1c; Future

## 2018-08-22 NOTE — Assessment & Plan Note (Signed)
Saw cardiology in 2018 with CV echo, no further workup or follow up was ordered Continue triam-hctz Update BNP tday F/U for new, worsening symptoms - triamterene-hydrochlorothiazide (MAXZIDE-25) 37.5-25 MG tablet; Take 1 tablet by mouth daily.  Dispense: 30 tablet; Refill: 0 - B Nat Peptide; Future

## 2018-08-22 NOTE — Assessment & Plan Note (Signed)
Declines treatment Will f/u for new, worsening symptoms

## 2018-08-22 NOTE — Assessment & Plan Note (Signed)
Encounter for general adult medical examination with abnormal findings Reviewed annual screening exams, healthy lifestyle, additional information provided on AVS - Ambulatory referral to Ophthalmology - CBC; Future - Lipid panel; Future - PSA; Future - Comprehensive metabolic panel; Future - Hemoglobin A1c; Future  Screening for prostate cancer- PSA; Future

## 2018-09-05 ENCOUNTER — Ambulatory Visit: Payer: No Typology Code available for payment source | Admitting: Podiatry

## 2018-09-05 ENCOUNTER — Encounter: Payer: Self-pay | Admitting: Podiatry

## 2018-09-05 DIAGNOSIS — M79675 Pain in left toe(s): Secondary | ICD-10-CM | POA: Diagnosis not present

## 2018-09-05 DIAGNOSIS — M79674 Pain in right toe(s): Secondary | ICD-10-CM | POA: Diagnosis not present

## 2018-09-05 DIAGNOSIS — Z7901 Long term (current) use of anticoagulants: Secondary | ICD-10-CM | POA: Diagnosis not present

## 2018-09-05 DIAGNOSIS — B351 Tinea unguium: Secondary | ICD-10-CM | POA: Diagnosis not present

## 2018-09-05 NOTE — Progress Notes (Signed)
Subjective:   Patient ID: Theodore [name], male   DOB: 62 y.o.   MRN: [id number]   HPI 62 year old male presents the office today for concerns of thick, discolored toenails that he cannot trim himself.  He states that he has had fungus for quite some time he has not wanted to treat it because he does not want oral medication.  He previously is tried topical he has been inconsistent with it as well.  He states that he cannot trim the nails himself because he cannot bend over because of his breathing.  He is also on Xarelto and he states that also her friend to try to trim them for him he bled for a couple of days.   Review of Systems  All other systems reviewed and are negative.  Past Medical History:  Diagnosis Date  . Anxiety   . Arthritis    "back" (01/24/2014)  . Asthma   . Chronic bronchitis (HCC)   . Cluster headache    "last bout was summer 2014; had them q spring 1991-2001" (01/24/2014)  . COPD (chronic obstructive pulmonary disease) (HCC)   . GERD (gastroesophageal reflux disease)   . NSTEMI (non-ST elevated myocardial infarction) (HCC) 01/2014  . On home oxygen therapy    "2L; 24/7" (01/24/2014)  . Pneumonia 06/2013; 01/2014  . Pulmonary embolism (HCC) 01/23/2014  . Seasonal allergies     Past Surgical History:  Procedure Laterality Date  . COLONOSCOPY WITH PROPOFOL N/A 10/20/2016   Procedure: COLONOSCOPY WITH PROPOFOL;  Surgeon: Sherrilyn Rist, MD;  Location: [facility];  Service: Gastroenterology;  Laterality: N/A;  . NASAL SEPTOPLASTY W/ TURBINOPLASTY Left 1983  . TOTAL HIP ARTHROPLASTY Left 07/16/2017   Procedure: TOTAL HIP ARTHROPLASTY ANTERIOR APPROACH;  Surgeon: Tarry Kos, MD;  Location: [facility] OR;  Service: Orthopedics;  Laterality: Left;  . TURBINATE REDUCTION Bilateral ~ 1983     Current Outpatient Medications:  .  albuterol (PROVENTIL) (2.5 MG/3ML) 0.083% nebulizer solution, Take 3 mLs (2.5 mg total) by nebulization every 6 (six) hours as needed for wheezing  or shortness of breath., Disp: 75 mL, Rfl: 11 .  budesonide-formoterol (SYMBICORT) 160-4.5 MCG/ACT inhaler, INHALE 2 PUFFS FIRST THING IN THE MORNING AND ANOTHER 2 PUFFS ABOUT 12 HOURS LATER, Disp: 10.2 g, Rfl: 5 .  fluticasone (FLONASE) 50 MCG/ACT nasal spray, Place 1 spray into both nostrils daily., Disp: , Rfl:  .  guaiFENesin (MUCINEX) 600 MG 12 hr tablet, Take 1,200 mg by mouth 2 (two) times daily. , Disp: , Rfl:  .  montelukast (SINGULAIR) 10 MG tablet, Take 1 tablet (10 mg total) by mouth at bedtime., Disp: 30 tablet, Rfl: 11 .  Multiple Vitamin (MULTI VITAMIN PO), Take 1 tablet by mouth daily., Disp: , Rfl:  .  nicotine polacrilex (NICORETTE) 4 MG gum, Take 4 mg by mouth daily as needed for smoking cessation., Disp: , Rfl:  .  OXYGEN, Inhale 2 L/min into the lungs as needed. For sats less than 90%, Disp: , Rfl:  .  predniSONE (DELTASONE) 10 MG tablet, Takes prednisone 5mg  x 2 days, then 10mg  on third day, Disp: 90 tablet, Rfl: 0 .  predniSONE (DELTASONE) 5 MG tablet, Take 5mg  for 3 days, 10mg  for 1 day, 5mg  for 3 days, 10mg  1 day and continue, Disp: 128 tablet, Rfl: 2 .  PROAIR HFA 108 (90 Base) MCG/ACT inhaler, INHALE 2 PUFFS BY MOUTH EVERY 6 HOURS AS NEEDED FOR WHEEZING OR SHORTNESS OF BREATH, Disp: 8.5 g, Rfl:  11 .  rivaroxaban (XARELTO) 20 MG TABS tablet, Take 1 tablet (20 mg total) by mouth every evening., Disp: 30 tablet, Rfl: 3 .  sodium chloride (OCEAN) 0.65 % SOLN nasal spray, Place 1 spray into both nostrils 4 (four) times daily as needed for congestion., Disp: , Rfl:  .  tiotropium (SPIRIVA HANDIHALER) 18 MCG inhalation capsule, INHALE THE CONTENTS OF 1 CAPSULE VIA INHALATION DEVICE EVERY DAY, Disp: 30 capsule, Rfl: 5 .  triamterene-hydrochlorothiazide (MAXZIDE-25) 37.5-25 MG tablet, Take 1 tablet by mouth daily., Disp: 30 tablet, Rfl: 0  Allergies  Allergen Reactions  . Naproxen Sodium Itching         Objective:  Physical Exam  General: AAO x3, NAD  Dermatological:  Nails are hypertrophic, dystrophic, brittle, discolored, elongated 10. No surrounding redness or drainage. The nails are causing irritation to the skin. The nails are curling on themselves causing irritation to the skin.No open lesions or pre-ulcerative lesions are identified today.  Vascular: DP, PT pulses decreased bilaterally however there is chronic lower extremity edema which is pitting which makes it difficult to evaluate the pulses.  Immediate capillary refill time to all the digits.  Neruologic: Grossly intact via light touch bilateral. Protective threshold with Semmes Wienstein monofilament intact to all pedal sites bilateral.   Musculoskeletal: No gross boney pedal deformities bilateral. No pain, crepitus, or limitation noted with foot and ankle range of motion bilateral. Muscular strength 5/5 in all groups tested bilateral.  Gait: Unassisted, Nonantalgic.     Assessment:   Symptomatic onychomycosis, on Xarelto    Plan:  -Treatment options discussed including all alternatives, risks, and complications -Etiology of symptoms were discussed -Nails debrided 10 without complications or bleeding. He wishes to hold off on treatment. We will schedule him for routine care.  -Daily foot inspection -Follow-up in 3 months or sooner if any problems arise. In the meantime, encouraged to call the office with any questions, concerns, change in symptoms.   Ovid Curd, DPM

## 2018-09-23 ENCOUNTER — Ambulatory Visit (INDEPENDENT_AMBULATORY_CARE_PROVIDER_SITE_OTHER): Payer: PPO | Admitting: Pulmonary Disease

## 2018-09-23 ENCOUNTER — Encounter: Payer: Self-pay | Admitting: Pulmonary Disease

## 2018-09-23 VITALS — BP 132/80 | HR 105 | Ht 76.0 in | Wt 229.0 lb

## 2018-09-23 DIAGNOSIS — J9610 Chronic respiratory failure, unspecified whether with hypoxia or hypercapnia: Secondary | ICD-10-CM

## 2018-09-23 DIAGNOSIS — J449 Chronic obstructive pulmonary disease, unspecified: Secondary | ICD-10-CM | POA: Diagnosis not present

## 2018-09-23 MED ORDER — PREDNISONE 5 MG PO TABS: 7.5000 mg | ORAL_TABLET | Freq: Every day | ORAL | 1 refills | Status: DC

## 2018-09-23 NOTE — Progress Notes (Signed)
Subjective:    Patient ID: Theodore [name], male    DOB: 1956-03-11, 62 y.o.   MRN: [id number]  Synopsis: Has stage IV COPD (steroid dependent) and history of a pulmonary embolism after a hospitalization in 2015 formerly followed by Dr. Sherene SiresWert.  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 Follow-up    States he is having increased SOB with chest tightness, currently on prednisione taper, wheezing.    Theodore [name] returns to clinic today stating that for the last few days he has been having more shortness of breath, chest tightness and wheezing.  He has been steadily decreasing his dose of prednisone per our instruction and is currently on a regimen where he takes 5 mg daily x3 days and then 10 mg on the fourth day.  However for the last few weeks he says that he has been dealing with chest tightness, wheezing, and shortness of breath.  No recent bronchitis, no fevers or chills.  His weight has been roughly stable in the last few weeks however he says that ever since he left the nursing home after he had a hip fracture this year he gained about 20 pounds back.  He says that while losing weight in the nursing home with fluid restriction he did have less fluid retention but his breathing actually worsens during that time.   Past Medical History:  Diagnosis Date  . Anxiety   . Arthritis    "back" (01/24/2014)  . Asthma   . Chronic bronchitis (HCC)   . Cluster headache    "last bout was summer 2014; had them q spring 1991-2001" (01/24/2014)  . COPD (chronic obstructive pulmonary disease) (HCC)   . GERD (gastroesophageal reflux disease)   . NSTEMI (non-ST elevated myocardial  infarction) (HCC) 01/2014  . On home oxygen therapy    "2L; 24/7" (01/24/2014)  . Pneumonia 06/2013; 01/2014  . Pulmonary embolism (HCC) 01/23/2014  . Seasonal allergies       Review of Systems  Constitutional: Positive for fatigue. Negative for chills and fever.  HENT: Negative for postnasal drip, rhinorrhea and sinus pressure.   Respiratory: Positive for chest tightness, shortness of breath and wheezing.   Cardiovascular: Positive for leg swelling. Negative for chest pain and palpitations.       Objective:   Physical Exam  Vitals:   09/23/18 1136  BP: 132/80  Pulse: (!) 105  SpO2: 95%  Weight: 229 lb (103.9 kg)  Height: 6\' 4"  (1.93 m)   RA  Gen: chronically ill appearing HENT: OP clear, TM's clear, neck supple PULM: Poor air movement, some wheezing B, normal percussion CV: RRR, no mgr, trace edema GI: BS+, soft, nontender Derm: no cyanosis or rash Psyche: normal mood and affect    PFT - PFT's 12/02/2011  FEV1  0.94 (24%) ratio 39 -  18% response to B 2 and DLCO 60%  Lab  -Alpha 1 genotype sent 03/01/12 >  MM    Echo: January 2018 echocardiogram reviewed showing a normal LVEF but mild pulmonary hypertension  Records from his last visit with us reviewed where he was started on Singulair and prednisone was tapered further.  Assessment & Plan:     COPD GOLD IV   Chronic respiratory failure, unspecified whether with hypoxia or hypercapnia (HCC)  Discussion: I think we are coming close to the lowest dose that Mr. Starace can handle.  Unfortunately he seems to be dependent on prednisone and was unable to take azithromycin when we prescribed it earlier this year.  I think for simplicity sake it is probably best to change the dose of prednisone to 7.5 mg daily.  Plan: COPD, prednisone dependent: Take prednisone 10 mg daily x3 days then start taking 7.5 mg daily until you see [place] next Continue taking Symbicort and Spiriva daily  History of pulmonary  embolism: Continue Xarelto daily  Follow up in 2-3 months or sooner if needed  > 50% of this 15 minute visit spent face to face   Current Outpatient Medications:  .  albuterol (PROVENTIL) (2.5 MG/3ML) 0.083% nebulizer solution, Take 3 mLs (2.5 mg total) by nebulization every 6 (six) hours as needed for wheezing or shortness of breath., Disp: 75 mL, Rfl: 11 .  budesonide-formoterol (SYMBICORT) 160-4.5 MCG/ACT inhaler, INHALE 2 PUFFS FIRST THING IN THE MORNING AND ANOTHER 2 PUFFS ABOUT 12 HOURS LATER, Disp: 10.2 g, Rfl: 5 .  fluticasone (FLONASE) 50 MCG/ACT nasal spray, Place 1 spray into both nostrils daily., Disp: , Rfl:  .  guaiFENesin (MUCINEX) 600 MG 12 hr tablet, Take 1,200 mg by mouth 2 (two) times daily. , Disp: , Rfl:  .  montelukast (SINGULAIR) 10 MG tablet, Take 1 tablet (10 mg total) by mouth at bedtime., Disp: 30 tablet, Rfl: 11 .  Multiple Vitamin (MULTI VITAMIN PO), Take 1 tablet by mouth daily., Disp: , Rfl:  .  nicotine polacrilex (NICORETTE) 4 MG gum, Take 4 mg by mouth daily as needed for smoking cessation., Disp: , Rfl:  .  OXYGEN, Inhale 2 L/min into the lungs as needed. For sats less than 90%, Disp: , Rfl:  .  predniSONE (DELTASONE) 5 MG tablet, Take 5mg  for 3 days, 10mg  for 1 day, 5mg  for 3 days, 10mg  1 day and continue, Disp: 128 tablet, Rfl: 2 .  PROAIR HFA 108 (90 Base) MCG/ACT inhaler, INHALE 2 PUFFS BY MOUTH EVERY 6 HOURS AS NEEDED FOR WHEEZING OR SHORTNESS OF BREATH, Disp: 8.5 g, Rfl: 11 .  rivaroxaban (XARELTO) 20 MG TABS tablet, Take 1 tablet (20 mg total) by mouth every evening., Disp: 30 tablet, Rfl: 3 .  sodium chloride (OCEAN) 0.65 % SOLN nasal spray, Place 1 spray into both nostrils 4 (four) times daily as needed for congestion., Disp: , Rfl:  .  tiotropium (SPIRIVA HANDIHALER) 18 MCG inhalation capsule, INHALE THE CONTENTS OF 1 CAPSULE VIA INHALATION DEVICE EVERY DAY, Disp: 30 capsule, Rfl: 5 .  triamterene-hydrochlorothiazide (MAXZIDE-25) 37.5-25 MG tablet,  Take 1 tablet by mouth daily., Disp: 30 tablet, Rfl: 0 .  predniSONE (DELTASONE) 5 MG tablet, Take 1.5 tablets (7.5 mg total) by mouth daily with breakfast. Take 1 and a 1/2 tablets daily, Disp: 45 tablet, Rfl: 1

## 2018-09-23 NOTE — Patient Instructions (Signed)
COPD, prednisone dependent: Take prednisone 10 mg daily x3 days then start taking 7.5 mg daily until you see us next Continue taking Symbicort and Spiriva daily  History of pulmonary embolism: Continue Xarelto daily  Follow up in 2-3 months or sooner if needed

## 2018-09-28 DIAGNOSIS — H25812 Combined forms of age-related cataract, left eye: Secondary | ICD-10-CM | POA: Diagnosis not present

## 2018-09-28 DIAGNOSIS — H25811 Combined forms of age-related cataract, right eye: Secondary | ICD-10-CM | POA: Diagnosis not present

## 2018-10-02 ENCOUNTER — Other Ambulatory Visit: Payer: Self-pay | Admitting: Pulmonary Disease

## 2018-10-09 ENCOUNTER — Other Ambulatory Visit: Payer: Self-pay | Admitting: Nurse Practitioner

## 2018-10-09 DIAGNOSIS — R600 Localized edema: Secondary | ICD-10-CM

## 2018-10-09 DIAGNOSIS — I10 Essential (primary) hypertension: Secondary | ICD-10-CM

## 2018-10-19 DIAGNOSIS — H25811 Combined forms of age-related cataract, right eye: Secondary | ICD-10-CM | POA: Diagnosis not present

## 2018-10-19 DIAGNOSIS — H2511 Age-related nuclear cataract, right eye: Secondary | ICD-10-CM | POA: Diagnosis not present

## 2018-10-28 ENCOUNTER — Telehealth: Payer: Self-pay | Admitting: Nurse Practitioner

## 2018-10-28 NOTE — Telephone Encounter (Signed)
Form has been completed & placed in providers box to sign.  °

## 2018-10-28 NOTE — Telephone Encounter (Signed)
Pt's Sister, Donia PoundsSusan Rogers, dropped off Accessible Collection Service Application for completion.  Once completed, please call pt and he will send someone in to pick it up for him.  Form put in Brittany's box for completion.

## 2018-10-28 NOTE — Telephone Encounter (Signed)
Form has been signed, Copy sent to scan.   Patient informed original is ready to be picked up.

## 2018-11-24 ENCOUNTER — Other Ambulatory Visit: Payer: Self-pay | Admitting: Pulmonary Disease

## 2018-11-29 DIAGNOSIS — H2512 Age-related nuclear cataract, left eye: Secondary | ICD-10-CM | POA: Diagnosis not present

## 2018-11-30 DIAGNOSIS — H2512 Age-related nuclear cataract, left eye: Secondary | ICD-10-CM | POA: Diagnosis not present

## 2018-11-30 DIAGNOSIS — H25812 Combined forms of age-related cataract, left eye: Secondary | ICD-10-CM | POA: Diagnosis not present

## 2018-12-02 ENCOUNTER — Ambulatory Visit (INDEPENDENT_AMBULATORY_CARE_PROVIDER_SITE_OTHER): Payer: PPO | Admitting: Pulmonary Disease

## 2018-12-02 ENCOUNTER — Encounter: Payer: Self-pay | Admitting: Pulmonary Disease

## 2018-12-02 VITALS — BP 142/74 | HR 94 | Ht 76.0 in | Wt 221.0 lb

## 2018-12-02 DIAGNOSIS — I4891 Unspecified atrial fibrillation: Secondary | ICD-10-CM | POA: Diagnosis not present

## 2018-12-02 DIAGNOSIS — J441 Chronic obstructive pulmonary disease with (acute) exacerbation: Secondary | ICD-10-CM | POA: Diagnosis not present

## 2018-12-02 DIAGNOSIS — Z5181 Encounter for therapeutic drug level monitoring: Secondary | ICD-10-CM | POA: Diagnosis not present

## 2018-12-02 DIAGNOSIS — J9611 Chronic respiratory failure with hypoxia: Secondary | ICD-10-CM

## 2018-12-02 DIAGNOSIS — J9610 Chronic respiratory failure, unspecified whether with hypoxia or hypercapnia: Secondary | ICD-10-CM | POA: Diagnosis not present

## 2018-12-02 DIAGNOSIS — R001 Bradycardia, unspecified: Secondary | ICD-10-CM

## 2018-12-02 MED ORDER — ALBUTEROL SULFATE (2.5 MG/3ML) 0.083% IN NEBU: 2.5000 mg | INHALATION_SOLUTION | Freq: Four times a day (QID) | RESPIRATORY_TRACT | 11 refills | Status: DC | PRN

## 2018-12-02 MED ORDER — PROAIR HFA 108 (90 BASE) MCG/ACT IN AERS: INHALATION_SPRAY | RESPIRATORY_TRACT | 11 refills | Status: DC

## 2018-12-02 MED ORDER — BUDESONIDE-FORMOTEROL FUMARATE 160-4.5 MCG/ACT IN AERO: INHALATION_SPRAY | RESPIRATORY_TRACT | 11 refills | Status: DC

## 2018-12-02 MED ORDER — PREDNISONE 20 MG PO TABS: 20.0000 mg | ORAL_TABLET | Freq: Every day | ORAL | 0 refills | Status: DC

## 2018-12-02 MED ORDER — HYDROCOD POLST-CPM POLST ER 10-8 MG/5ML PO SUER: 5.0000 mL | Freq: Two times a day (BID) | ORAL | Status: DC | PRN

## 2018-12-02 MED ORDER — AZITHROMYCIN 250 MG PO TABS: 250.0000 mg | ORAL_TABLET | Freq: Every day | ORAL | 1 refills | Status: DC

## 2018-12-02 MED ORDER — DOXYCYCLINE HYCLATE 100 MG PO TABS: 100.0000 mg | ORAL_TABLET | Freq: Two times a day (BID) | ORAL | 0 refills | Status: DC

## 2018-12-02 MED ORDER — HYDROCOD POLST-CPM POLST ER 10-8 MG/5ML PO SUER: 5.0000 mL | Freq: Two times a day (BID) | ORAL | 0 refills | Status: DC | PRN

## 2018-12-02 MED ORDER — TIOTROPIUM BROMIDE MONOHYDRATE 18 MCG IN CAPS: ORAL_CAPSULE | RESPIRATORY_TRACT | 11 refills | Status: DC

## 2018-12-02 NOTE — Patient Instructions (Signed)
COPD with acute exacerbation: Take prednisone 20 mg daily x5 days, then go back to taking 7.5 mg daily Take doxycycline 100 mg twice a day x5 days After completing the doxycycline start taking azithromycin 250 mg daily Continue Symbicort 2 puffs twice a day Continue Spiriva daily Continue albuterol as needed for chest tightness wheezing or shortness of breath  Severe cough: Take the Tussionex as needed for cough, be sure to take this without any other sedatives and do not take this medicine and drive as it can make you sleepy.  Symptomatic bradycardia (low heart rate): I will reach out to Dr. Rennis Golden to see if he could see you in clinic to assess this further.  We will see you back in 2 months or sooner if needed

## 2018-12-02 NOTE — Progress Notes (Signed)
Subjective:    Patient ID: Theodore [name], male    DOB: Feb 26, 1956, 63 y.o.   MRN: [id number]  Synopsis: Has stage IV COPD (steroid dependent) and history of a pulmonary embolism after a hospitalization in 2015 formerly followed by Dr. Sherene SiresWert.  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 Follow-up    pt having more good than bad days- sob with any exertion, mostly nonprod cough.    Mr. [name] CrawfordJefcoat returns to clinic today feeling quite ill.  Specifically he has been having many more bad days than good days.  He says that his chest is been tight, he is been coughing more and has a lot of congestion.  He says he is unable to cough the mucus up.  He says the cough has been quite severe.  He just had cataract surgery earlier this week.  Fortunately that day was a good day but ever since then he has been coughing quite a bit.  He says that he continues to take prednisone 7.5 mg daily.  He tells me that on some of his bad days when he is not tight, not coughing up more mucus he has noticed that he feels "rundown" and he notices that his heart rate will be 30-50 which is unusual for him   Past Medical History:  Diagnosis Date  . Anxiety   . Arthritis    "back" (01/24/2014)  . Asthma   . Chronic bronchitis (HCC)   . Cluster headache    "last bout was summer 2014; had them q spring 1991-2001" (01/24/2014)  . COPD (chronic obstructive pulmonary disease) (HCC)   . GERD (gastroesophageal reflux disease)   . NSTEMI (non-ST elevated myocardial infarction) (HCC) 01/2014  . On home oxygen therapy    "2L; 24/7" (01/24/2014)  . Pneumonia 06/2013; 01/2014  . Pulmonary embolism (HCC) 01/23/2014    . Seasonal allergies       Review of Systems  Constitutional: Positive for fatigue. Negative for chills and fever.  HENT: Negative for postnasal drip, rhinorrhea and sinus pressure.   Respiratory: Positive for chest tightness, shortness of breath and wheezing.   Cardiovascular: Positive for leg swelling. Negative for chest pain and palpitations.       Objective:   Physical Exam  Vitals:   12/02/18 1327  BP: (!) 142/74  Pulse: 94  SpO2: 98%  Weight: 221 lb (100.2 kg)  Height: 6\' 4"  (1.93 m)   RA  Gen: chronically ill appearing HENT: OP clear, TM's clear, neck supple PULM: Poor air movement, some wheezing B, normal percussion CV: RRR, no mgr, trace edema GI: BS+, soft, nontender Derm: no cyanosis or rash Psyche: normal mood and affect   PFT - PFT's 12/02/2011  FEV1  0.94 (24%) ratio 39 -  18% response to B 2 and DLCO 60%  Lab  -Alpha 1 genotype sent 03/01/12 >  MM    Echo: January 2018 echocardiogram reviewed showing a normal LVEF but mild pulmonary hypertension  Records from his last visit with us reviewed where he was started on Singulair and prednisone was tapered further.     Assessment & Plan:     COPD with acute  exacerbation (HCC) - Plan: DISCONTINUED: chlorpheniramine-HYDROcodone (TUSSIONEX) 10-8 MG/5ML suspension 5 mL  Chronic respiratory failure, unspecified whether with hypoxia or hypercapnia (HCC)  Atrial fibrillation, unspecified type (HCC)  Therapeutic drug monitoring  Chronic respiratory failure with hypoxia (HCC)  Symptomatic bradycardia  Discussion: Unfortunately Mr. [name] Crawford is having another exacerbation of his severe COPD.  He remains prednisone dependent.  He is having a severe cough as well which is not good considering the fact that he just had eye surgery a few days ago.  In conversation today it seems that he actually did better when he was taking the daily azithromycin.  He was reassured by his cardiologist that it should not be a  problem for him to continue taking this so I think it is a good idea for [place] to get him back on that medicine.  Plan: COPD with acute exacerbation: Take prednisone 20 mg daily x5 days, then go back to taking 7.5 mg daily Take doxycycline 100 mg twice a day x5 days After completing the doxycycline start taking azithromycin 250 mg daily Continue Symbicort 2 puffs twice a day Continue Spiriva daily Continue albuterol as needed for chest tightness wheezing or shortness of breath  Severe cough: Take the Tussionex as needed for cough, be sure to take this without any other sedatives and do not take this medicine and drive as it can make you sleepy.  Symptomatic bradycardia (low heart rate): I will reach out to Dr. Rennis Golden to see if he could see you in clinic to assess this further.  We will see you back in 2 months or sooner if needed   Current Outpatient Medications:  .  albuterol (PROVENTIL) (2.5 MG/3ML) 0.083% nebulizer solution, Take 3 mLs (2.5 mg total) by nebulization every 6 (six) hours as needed for wheezing or shortness of breath., Disp: 360 mL, Rfl: 11 .  budesonide-formoterol (SYMBICORT) 160-4.5 MCG/ACT inhaler, INHALE 2 PUFFS FIRST THING IN THE MORNING AND ANOTHER 2 PUFFS ABOUT 12 HOURS LATER, Disp: 10.2 g, Rfl: 11 .  fluticasone (FLONASE) 50 MCG/ACT nasal spray, Place 1 spray into both nostrils daily., Disp: , Rfl:  .  guaiFENesin (MUCINEX) 600 MG 12 hr tablet, Take 1,200 mg by mouth 2 (two) times daily. , Disp: , Rfl:  .  montelukast (SINGULAIR) 10 MG tablet, Take 1 tablet (10 mg total) by mouth at bedtime., Disp: 30 tablet, Rfl: 11 .  Multiple Vitamin (MULTI VITAMIN PO), Take 1 tablet by mouth daily., Disp: , Rfl:  .  nicotine polacrilex (NICORETTE) 2 MG gum, Take 2 mg by mouth as needed for smoking cessation., Disp: , Rfl:  .  OXYGEN, Inhale 2 L/min into the lungs as needed. For sats less than 90%, Disp: , Rfl:  .  predniSONE (DELTASONE) 5 MG tablet, Take 5mg  for 3 days, 10mg  for  1 day, 5mg  for 3 days, 10mg  1 day and continue, Disp: 128 tablet, Rfl: 2 .  predniSONE (DELTASONE) 5 MG tablet, Take 1.5 tablets (7.5 mg total) by mouth daily with breakfast. Take 1 and a 1/2 tablets daily, Disp: 45 tablet, Rfl: 1 .  PROAIR HFA 108 (90 Base) MCG/ACT inhaler, INHALE 2 PUFFS BY MOUTH EVERY 6 HOURS AS NEEDED FOR WHEEZING OR SHORTNESS OF BREATH, Disp: 8.5 g, Rfl: 11 .  sodium chloride (OCEAN) 0.65 % SOLN nasal spray, Place 1 spray into both nostrils 4 (four) times daily as needed for congestion., Disp: , Rfl:  .  tiotropium (SPIRIVA HANDIHALER) 18 MCG inhalation capsule, INHALE THE CONTENTS OF  1 CAPSULE VIA INHALATION DEVICE EVERY DAY, Disp: 30 capsule, Rfl: 11 .  triamterene-hydrochlorothiazide (MAXZIDE-25) 37.5-25 MG tablet, TAKE 1 TABLET BY MOUTH DAILY, Disp: 90 tablet, Rfl: 2 .  XARELTO 20 MG TABS tablet, TAKE 1 TABLET(20 MG) BY MOUTH EVERY EVENING, Disp: 30 tablet, Rfl: 5 .  azithromycin (ZITHROMAX) 250 MG tablet, Take 1 tablet (250 mg total) by mouth daily., Disp: 30 tablet, Rfl: 1 .  doxycycline (VIBRA-TABS) 100 MG tablet, Take 1 tablet (100 mg total) by mouth 2 (two) times daily., Disp: 10 tablet, Rfl: 0 .  predniSONE (DELTASONE) 20 MG tablet, Take 1 tablet (20 mg total) by mouth daily with breakfast., Disp: 5 tablet, Rfl: 0

## 2018-12-06 ENCOUNTER — Encounter: Payer: Self-pay | Admitting: Podiatry

## 2018-12-06 ENCOUNTER — Ambulatory Visit: Payer: PPO | Admitting: Podiatry

## 2018-12-06 ENCOUNTER — Telehealth: Payer: Self-pay | Admitting: Internal Medicine

## 2018-12-06 ENCOUNTER — Other Ambulatory Visit: Payer: Self-pay | Admitting: *Deleted

## 2018-12-06 DIAGNOSIS — M79674 Pain in right toe(s): Secondary | ICD-10-CM

## 2018-12-06 DIAGNOSIS — R001 Bradycardia, unspecified: Secondary | ICD-10-CM

## 2018-12-06 DIAGNOSIS — B351 Tinea unguium: Secondary | ICD-10-CM | POA: Diagnosis not present

## 2018-12-06 DIAGNOSIS — M79675 Pain in left toe(s): Secondary | ICD-10-CM | POA: Diagnosis not present

## 2018-12-06 NOTE — Patient Instructions (Signed)
Onychomycosis/Fungal Toenails  WHAT IS IT? An infection that lies within the keratin of your nail plate that is caused by a fungus.  WHY ME? Fungal infections affect all ages, sexes, races, and creeds.  There may be many factors that predispose you to a fungal infection such as age, coexisting medical conditions such as diabetes, or an autoimmune disease; stress, medications, fatigue, genetics, etc.  Bottom line: fungus thrives in a warm, moist environment and your shoes offer such a location.  IS IT CONTAGIOUS? Theoretically, yes.  You do not want to share shoes, nail clippers or files with someone who has fungal toenails.  Walking around barefoot in the same room or sleeping in the same bed is unlikely to transfer the organism.  It is important to realize, however, that fungus can spread easily from one nail to the next on the same foot.  HOW DO WE TREAT THIS?  There are several ways to treat this condition.  Treatment may depend on many factors such as age, medications, pregnancy, liver and kidney conditions, etc.  It is best to ask your doctor which options are available to you.  1. No treatment.   Unlike many other medical concerns, you can live with this condition.  However for many people this can be a painful condition and may lead to ingrown toenails or a bacterial infection.  It is recommended that you keep the nails cut short to help reduce the amount of fungal nail. 2. Topical treatment.  These range from herbal remedies to prescription strength nail lacquers.  About 40-50% effective, topicals require twice daily application for approximately 9 to 12 months or until an entirely new nail has grown out.  The most effective topicals are medical grade medications available through physicians offices. 3. Oral antifungal medications.  With an 80-90% cure rate, the most common oral medication requires 3 to 4 months of therapy and stays in your system for a year as the new nail grows out.  Oral  antifungal medications do require blood work to make sure it is a safe drug for you.  A liver function panel will be performed prior to starting the medication and after the first month of treatment.  It is important to have the blood work performed to avoid any harmful side effects.  In general, this medication safe but blood work is required. 4. Laser Therapy.  This treatment is performed by applying a specialized laser to the affected nail plate.  This therapy is noninvasive, fast, and non-painful.  It is not covered by insurance and is therefore, out of pocket.  The results have been very good with a 80-95% cure rate.  The [facility] is the only practice in the area to offer this therapy. 5. Permanent Nail Avulsion.  Removing the entire nail so that a new nail will not grow back.  Edema  Edema is when you have too much fluid in your body or under your skin. Edema may make your legs, feet, and ankles swell up. Swelling is also common in looser tissues, like around your eyes. This is a common condition. It gets more common as you get older. There are many possible causes of edema. Eating too much salt (sodium) and being on your feet or sitting for a long time can cause edema in your legs, feet, and ankles. Hot weather may make edema worse. Edema is usually painless. Your skin may look swollen or shiny. Follow these instructions at home:  Keep the swollen   body part raised (elevated) above the level of your heart when you are sitting or lying down.  Do not sit still or stand for a long time.  Do not wear tight clothes. Do not wear garters on your upper legs.  Exercise your legs. This can help the swelling go down.  Wear elastic bandages or support stockings as told by your doctor.  Eat a low-salt (low-sodium) diet to reduce fluid as told by your doctor.  Depending on the cause of your swelling, you may need to limit how much fluid you drink (fluid restriction).  Take over-the-counter  and prescription medicines only as told by your doctor. Contact a doctor if:  Treatment is not working.  You have heart, liver, or kidney disease and have symptoms of edema.  You have sudden and unexplained weight gain. Get help right away if:  You have shortness of breath or chest pain.  You cannot breathe when you lie down.  You have pain, redness, or warmth in the swollen areas.  You have heart, liver, or kidney disease and get edema all of a sudden.  You have a fever and your symptoms get worse all of a sudden. Summary  Edema is when you have too much fluid in your body or under your skin.  Edema may make your legs, feet, and ankles swell up. Swelling is also common in looser tissues, like around your eyes.  Raise (elevate) the swollen body part above the level of your heart when you are sitting or lying down.  Follow your doctor's instructions about diet and how much fluid you can drink (fluid restriction). This information is not intended to replace advice given to you by your health care provider. Make sure you discuss any questions you have with your health care provider. Document Released: 04/13/2008 Document Revised: 11/13/2016 Document Reviewed: 11/13/2016 Elsevier Interactive Patient Education  2019 [employer]  

## 2018-12-06 NOTE — Telephone Encounter (Signed)
Per communication from MD, patient needs 2 week event monitor to evaluate bradycardia and then f/up after. This test has been ordered & a message sent to [facility] scheduling pool to arrange appointments for patient.

## 2018-12-16 ENCOUNTER — Encounter: Payer: Self-pay | Admitting: Podiatry

## 2018-12-16 NOTE — Progress Notes (Signed)
Subjective: Theodore [name] is a 63 y.o. y.o. male who presents today with painful, discolored, thick toenails  which interfere with daily activities. Pain is aggravated when wearing enclosed shoe gear. Pain is relieved with periodic professional debridement.  He remains on blood thinner, Xarelto.  Theodore [name] voices no new problems on today's visit.  Patient's PCP is Theodore Bury, NP and last date seen was 08/18/2018.   Current Outpatient Medications:  .  albuterol (PROVENTIL) (2.5 MG/3ML) 0.083% nebulizer solution, Take 3 mLs (2.5 mg total) by nebulization every 6 (six) hours as needed for wheezing or shortness of breath., Disp: 360 mL, Rfl: 11 .  azithromycin (ZITHROMAX) 250 MG tablet, Take 1 tablet (250 mg total) by mouth daily., Disp: 30 tablet, Rfl: 1 .  budesonide-formoterol (SYMBICORT) 160-4.5 MCG/ACT inhaler, INHALE 2 PUFFS FIRST THING IN THE MORNING AND ANOTHER 2 PUFFS ABOUT 12 HOURS LATER, Disp: 10.2 g, Rfl: 11 .  chlorpheniramine-HYDROcodone (TUSSIONEX PENNKINETIC ER) 10-8 MG/5ML SUER, Take 5 mLs by mouth every 12 (twelve) hours as needed for cough., Disp: 140 mL, Rfl: 0 .  doxycycline (VIBRA-TABS) 100 MG tablet, Take 1 tablet (100 mg total) by mouth 2 (two) times daily., Disp: 10 tablet, Rfl: 0 .  fluticasone (FLONASE) 50 MCG/ACT nasal spray, Place 1 spray into both nostrils daily., Disp: , Rfl:  .  guaiFENesin (MUCINEX) 600 MG 12 hr tablet, Take 1,200 mg by mouth 2 (two) times daily. , Disp: , Rfl:  .  montelukast (SINGULAIR) 10 MG tablet, Take 1 tablet (10 mg total) by mouth at bedtime., Disp: 30 tablet, Rfl: 11 .  Multiple Vitamin (MULTI VITAMIN PO), Take 1 tablet by mouth daily., Disp: , Rfl:  .  nicotine polacrilex (NICORETTE) 2 MG gum, Take 2 mg by mouth as needed for smoking cessation., Disp: , Rfl:  .  OXYGEN, Inhale 2 L/min into the lungs as needed. For sats less than 90%, Disp: , Rfl:  .  predniSONE (DELTASONE) 20 MG tablet, Take 1 tablet (20 mg total) by mouth  daily with breakfast., Disp: 5 tablet, Rfl: 0 .  predniSONE (DELTASONE) 5 MG tablet, Take 5mg  for 3 days, 10mg  for 1 day, 5mg  for 3 days, 10mg  1 day and continue, Disp: 128 tablet, Rfl: 2 .  predniSONE (DELTASONE) 5 MG tablet, Take 1.5 tablets (7.5 mg total) by mouth daily with breakfast. Take 1 and a 1/2 tablets daily, Disp: 45 tablet, Rfl: 1 .  PROAIR HFA 108 (90 Base) MCG/ACT inhaler, INHALE 2 PUFFS BY MOUTH EVERY 6 HOURS AS NEEDED FOR WHEEZING OR SHORTNESS OF BREATH, Disp: 8.5 g, Rfl: 11 .  sodium chloride (OCEAN) 0.65 % SOLN nasal spray, Place 1 spray into both nostrils 4 (four) times daily as needed for congestion., Disp: , Rfl:  .  tiotropium (SPIRIVA HANDIHALER) 18 MCG inhalation capsule, INHALE THE CONTENTS OF 1 CAPSULE VIA INHALATION DEVICE EVERY DAY, Disp: 30 capsule, Rfl: 11 .  triamterene-hydrochlorothiazide (MAXZIDE-25) 37.5-25 MG tablet, TAKE 1 TABLET BY MOUTH DAILY, Disp: 90 tablet, Rfl: 2 .  XARELTO 20 MG TABS tablet, TAKE 1 TABLET(20 MG) BY MOUTH EVERY EVENING, Disp: 30 tablet, Rfl: 5   Allergies  Allergen Reactions  . Naproxen Sodium Itching     Objective: Vascular Examination: Capillary refill time immediate x10 digits Dorsalis pedis pulses unable to palpate due to lower extremity edema Posterior tibial pulses unable to palpate due to lower extremity edema No digital hair x 10 digits Skin temperature gradient within normal limits bilaterally +2-3 edema bilateral  lower extremities with no blisters, no weeping, no signs of cellulitis.  Dermatological Examination: Skin warm and dry bilaterally  Edema as described above bilaterally  Toenails 1-5 b/l discolored, thick, dystrophic with subungual debris and pain with palpation to nailbeds due to thickness of nails.  Musculoskeletal: Muscle strength 5/5 to all LE muscle groups  No gross bony pedal deformities bilaterally  Neurological: Sensation intact with 10 gram monofilament. Vibratory sensation  intact.  Assessment: Painful onychomycosis toenails 1-5 b/l in patient on blood thinner.  Bilateral lower extremity edema  Plan: 1. Toenails 1-5 b/l were debrided in length and girth without iatrogenic bleeding. 2. Patient to continue soft, supportive shoe gear 3. Patient to report any pedal injuries to medical professional immediately. 4. Avoid self trimming due to use of blood thinner. 5. Follow up 3 months.  6. Patient/POA to call should there be a concern in the interim.

## 2018-12-19 ENCOUNTER — Telehealth: Payer: Self-pay | Admitting: Pulmonary Disease

## 2018-12-19 NOTE — Telephone Encounter (Signed)
Called and explained to the patient that he would need to bring the paper in the office so we could handle this matter. He verbalized understanding and nothing further is needed.

## 2018-12-21 ENCOUNTER — Telehealth: Payer: Self-pay | Admitting: Pulmonary Disease

## 2018-12-21 NOTE — Telephone Encounter (Signed)
Patient dropped forms off to be signed by Dr Kendrick Fries, and sent back to Patient.  Form is located in Dr Ulyses Jarred box at front desk. Patient aware that Dr Kendrick Fries is not in the office today.  Will route message to [place], to follow up

## 2018-12-22 NOTE — Telephone Encounter (Signed)
Will await update from [place].

## 2018-12-26 ENCOUNTER — Ambulatory Visit (INDEPENDENT_AMBULATORY_CARE_PROVIDER_SITE_OTHER): Payer: PPO

## 2018-12-26 DIAGNOSIS — R001 Bradycardia, unspecified: Secondary | ICD-10-CM | POA: Diagnosis not present

## 2018-12-26 NOTE — Telephone Encounter (Signed)
Waiting for Morrie Sheldon to update.

## 2018-12-27 NOTE — Telephone Encounter (Signed)
I have located the forms for Theodore [name]. They are in the pink to do folder for BQ located in the cabinet in [facility]. I will route this to both BQ and Morrie Sheldon to follow up on once completed. Thank you.

## 2018-12-28 NOTE — Telephone Encounter (Signed)
Will await to see that Theodore [name] has seen this as well as BQ.

## 2018-12-28 NOTE — Telephone Encounter (Signed)
I saw the letter, I'm happy to write him a letter but the form didn't have a place for me to fill anything out.  Maybe I read it wrong.    A letter should read as follows:  To whom it may concern:  [name] has been my patient for several years.  He has advanced COPD with recurrent exacerbations causing daily symptoms of shortness of breath, cough and frequent doctors visits for flareups.  From my standpoint he is not well enough to participate in jury duty.  Sincerely,  [place]

## 2018-12-28 NOTE — Telephone Encounter (Signed)
BQ please advise on pt's request to be excused from [place] duty- letter is in pink TO-DO folder.  Thanks!

## 2018-12-28 NOTE — Telephone Encounter (Signed)
Called and spoke with Theodore [name] himself, patient stated that he would like to come in and pick up the letter himself. Letter written, printed, and placed up front for the patient. Nothing further needed.

## 2019-01-04 ENCOUNTER — Encounter: Payer: Self-pay | Admitting: *Deleted

## 2019-01-12 ENCOUNTER — Ambulatory Visit: Payer: PPO | Admitting: Internal Medicine

## 2019-01-12 ENCOUNTER — Encounter: Payer: Self-pay | Admitting: Internal Medicine

## 2019-01-12 VITALS — BP 160/90 | HR 97 | Ht 76.0 in | Wt 222.4 lb

## 2019-01-12 DIAGNOSIS — R001 Bradycardia, unspecified: Secondary | ICD-10-CM | POA: Diagnosis not present

## 2019-01-12 DIAGNOSIS — I499 Cardiac arrhythmia, unspecified: Secondary | ICD-10-CM

## 2019-01-12 NOTE — Progress Notes (Signed)
OFFICE CONSULT NOTE  Chief Complaint:  Possible a-fib  Primary Care Physician: Evaristo Bury, NP  HPI:  Theodore [name] is a 63 y.o. male who is being seen today for the evaluation of possible a-fib at the request of Dr. Kendrick Fries. Theodore [name] is a 63 year old male with a history of COPD and non-STEMI in 2015. Recently he's had worsening COPD and is on home oxygen and 10 mg of prednisone daily along with the usual inhalers and nebulizers at home. He has had several recent COPD exacerbations in which EMS was called. Once his heart rate is racing and felt to be irregular, concerning for possible atrial fibrillation. This was not definitively diagnosed. He's also had several other episodes of irregularity to his heart rate. At times he says he has bradycardia as well with heart rates as low as the 40s but has not had any episodes like that recently. He denies any chest pain. Today significantly short of breath. He has used his inhalers this morning but is coughing. Recently an echo showed normal systolic function with mild pulmonary hypertension an EF of 65-70% in June 2018. Biatrial size was normal. EKG in the office today shows a sinus arrhythmia at a rate of 97. 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 He is not currently on any AV nodal blocking medications.  01/12/2019  Theodore [name] returns today for follow-up of monitor.  This was for symptomatic bradycardia however there is concern for possible A. fib.  The monitor showed no heart rate less than 58, some frequent PACs and a lot of artifact.  There are possibly couple areas that were irregularly irregular concerning for A. fib however the diagnosis could not definitively be made.  1 day he had approximately about 8 hours of an irregularly irregular rhythm.  This may be A. fib however it is noted that he is ready anticoagulated on Xarelto for chronic pulmonary emboli.   PMHx:  Past Medical History:    Diagnosis Date  . Anxiety   . Arthritis    "back" (01/24/2014)  . Asthma   . Chronic bronchitis (HCC)   . Cluster headache    "last bout was summer 2014; had them q spring 1991-2001" (01/24/2014)  . COPD (chronic obstructive pulmonary disease) (HCC)   . GERD (gastroesophageal reflux disease)   . NSTEMI (non-ST elevated myocardial infarction) (HCC) 01/2014  . On home oxygen therapy    "2L; 24/7" (01/24/2014)  . Pneumonia 06/2013; 01/2014  . Pulmonary embolism (HCC) 01/23/2014  . Seasonal allergies     Past Surgical History:  Procedure Laterality Date  . COLONOSCOPY WITH PROPOFOL N/A 10/20/2016   Procedure: COLONOSCOPY WITH PROPOFOL;  Surgeon: Sherrilyn Rist, MD;  Location: [facility];  Service: Gastroenterology;  Laterality: N/A;  . NASAL SEPTOPLASTY W/ TURBINOPLASTY Left 1983  . TOTAL HIP ARTHROPLASTY Left 07/16/2017   Procedure: TOTAL HIP ARTHROPLASTY ANTERIOR APPROACH;  Surgeon: Tarry Kos, MD;  Location: [facility] OR;  Service: Orthopedics;  Laterality: Left;  . TURBINATE REDUCTION Bilateral ~ 1983    FAMHx:  Family History  Problem Relation Age of Onset  . Leukemia Father   . Heart disease Father   . Asthma Father   . Asthma Sister   . Emphysema Sister   . Heart disease Sister   . High blood pressure Mother   . Colon cancer Cousin        First cousin    SOCHx:  reports that he quit smoking about 6 years ago. His smoking use included cigarettes. He has a 70.00 pack-year smoking history. He has never used smokeless tobacco. He reports current alcohol use. He reports that he does not use drugs.  ALLERGIES:  Allergies  Allergen Reactions  . Naproxen Sodium Itching    ROS: Pertinent items noted in HPI and remainder of comprehensive ROS otherwise negative.  HOME MEDS: Current Outpatient Medications on File Prior to Visit  Medication Sig Dispense Refill  . albuterol (PROVENTIL) (2.5 MG/3ML) 0.083% nebulizer solution Take 3 mLs (2.5 mg total) by nebulization every 6  (six) hours as needed for wheezing or shortness of breath. 360 mL 11  . azithromycin (ZITHROMAX) 250 MG tablet Take 1 tablet (250 mg total) by mouth daily. 30 tablet 1  . budesonide-formoterol (SYMBICORT) 160-4.5 MCG/ACT inhaler INHALE 2 PUFFS FIRST THING IN THE MORNING AND ANOTHER 2 PUFFS ABOUT 12 HOURS LATER 10.2 g 11  . chlorpheniramine-HYDROcodone (TUSSIONEX PENNKINETIC ER) 10-8 MG/5ML SUER Take 5 mLs by mouth every 12 (twelve) hours as needed for cough. 140 mL 0  . doxycycline (VIBRA-TABS) 100 MG tablet Take 1 tablet (100 mg total) by mouth 2 (two) times daily. 10 tablet 0  . fluticasone (FLONASE) 50 MCG/ACT nasal spray Place 1 spray into both nostrils daily.    Marland Kitchen guaiFENesin (MUCINEX) 600 MG 12 hr tablet Take 1,200 mg by mouth 2 (two) times daily.     . montelukast (SINGULAIR) 10 MG tablet Take 1 tablet (10 mg total) by mouth at bedtime. 30 tablet 11  . Multiple Vitamin (MULTI VITAMIN PO) Take 1 tablet by mouth daily.    . nicotine polacrilex (NICORETTE) 2 MG gum Take 2 mg by mouth as needed for smoking cessation.    . OXYGEN Inhale 2 L/min into the lungs as needed. For sats less than 90%    . predniSONE (DELTASONE) 20 MG tablet Take 1 tablet (20 mg total) by mouth daily with breakfast. 5 tablet 0  . predniSONE (DELTASONE) 5 MG tablet Take 5mg  for 3 days, 10mg  for 1 day, 5mg  for 3 days, 10mg  1 day and continue 128 tablet 2  . predniSONE (DELTASONE) 5 MG tablet Take 1.5 tablets (7.5 mg total) by mouth daily with breakfast. Take 1 and a 1/2 tablets daily 45 tablet 1  . PROAIR HFA 108 (90 Base) MCG/ACT inhaler INHALE 2 PUFFS BY MOUTH EVERY 6 HOURS AS NEEDED FOR WHEEZING OR SHORTNESS OF BREATH 8.5 g 11  . sodium chloride (OCEAN) 0.65 % SOLN nasal spray Place 1 spray into both nostrils 4 (four) times daily as needed for congestion.    Marland Kitchen tiotropium (SPIRIVA HANDIHALER) 18 MCG inhalation capsule INHALE THE CONTENTS OF 1 CAPSULE VIA INHALATION DEVICE EVERY DAY 30 capsule 11  .  triamterene-hydrochlorothiazide (MAXZIDE-25) 37.5-25 MG tablet TAKE 1 TABLET BY MOUTH DAILY 90 tablet 2  . XARELTO 20 MG TABS tablet TAKE 1 TABLET(20 MG) BY MOUTH EVERY EVENING 30 tablet 5   No current facility-administered medications on file prior to visit.     LABS/IMAGING: No results found for this or any previous visit (from the past 48 hour(s)). No results found.  LIPID PANEL:    Component Value Date/Time   CHOL 215 (H) 08/18/2018 1144   TRIG 103.0 08/18/2018 1144   HDL 68.10 08/18/2018 1144   CHOLHDL 3 08/18/2018 1144   VLDL 20.6 08/18/2018 1144   LDLCALC 126 (H) 08/18/2018 1144    WEIGHTS: Wt Readings from Last 3 Encounters:  01/12/19 222 lb 6.4 oz (100.9 kg)  12/02/18 221 lb (100.2 kg)  09/23/18 229 lb (103.9 kg)    VITALS: BP (!) 160/90   Pulse 97   Ht  (1.93 m)   Wt 222 lb 6.4 oz (100.9 kg)   SpO2 98%   BMI 27.07 kg/m   EXAM: Deferred  EKG: Sinus rhythm marked sinus arrhythmia at 93-personally reviewed  ASSESSMENT: 1. Possible atrial fibrillation versus MAT 2. Severe COPD on oral steroids and oxygen 3. History of non-STEMI 4. 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 Hospital symptomatic bradycardia  PLAN: 1.   Theodore [name] may be having paroxysmal atrial fibrillation.  The findings on the monitor are unclear, this may also be MAT.  Nonetheless, he is chronically anticoagulated on Xarelto and I would likely recommend lifelong anticoagulation as a treatment either way.  There were no significant periods of symptomatic bradycardia that were noted.  Would consider avoiding AV nodal blocking agents if this is a problem.  Otherwise no further suggestions at this time.  I agree with treatment of his cholesterol to goal LDL less than 70 given history of non-STEMI.  Follow-up with me as needed.  Chrystie Nose, MD, [facility], FACP  [place]  [facility]  Medical Director of the Advanced Lipid Disorders &  Cardiovascular Risk  Reduction Clinic Diplomate of the American Board of Clinical Lipidology Attending Cardiologist  Direct Dial: [phone]  Fax: [phone]  Website:  [email or web address]  Lisette Abu Obelia Bonello 01/12/2019, 1:27 PM

## 2019-01-12 NOTE — Patient Instructions (Signed)
Medication Instructions:  Your Physician recommend you continue on your current medication as directed.    If you need a refill on your cardiac medications before your next appointment, please call your pharmacy.   Lab work: None   Testing/Procedures: None  Follow-Up: At [facility], you and your health needs are our priority.  As part of our continuing mission to provide you with exceptional heart care, we have created designated Provider Care Teams.  These Care Teams include your primary Cardiologist (physician) and Advanced Practice Providers (APPs -  Physician Assistants and Nurse Practitioners) who all work together to provide you with the care you need, when you need it. You will need a follow up appointment as needed.  Please call our office 2 months in advance to schedule this appointment.  You may see Dr. Rennis Golden or one of the following Advanced Practice Providers on your designated Care Team: Azalee Course, [place] . Micah Flesher, PA-C

## 2019-01-27 ENCOUNTER — Ambulatory Visit (INDEPENDENT_AMBULATORY_CARE_PROVIDER_SITE_OTHER): Payer: PPO | Admitting: Pulmonary Disease

## 2019-01-27 ENCOUNTER — Encounter: Payer: Self-pay | Admitting: Pulmonary Disease

## 2019-01-27 ENCOUNTER — Other Ambulatory Visit: Payer: Self-pay

## 2019-01-27 VITALS — BP 128/80 | HR 111 | Temp 98.4°F | Ht 76.0 in | Wt 222.0 lb

## 2019-01-27 DIAGNOSIS — I4891 Unspecified atrial fibrillation: Secondary | ICD-10-CM

## 2019-01-27 DIAGNOSIS — J9610 Chronic respiratory failure, unspecified whether with hypoxia or hypercapnia: Secondary | ICD-10-CM

## 2019-01-27 DIAGNOSIS — J441 Chronic obstructive pulmonary disease with (acute) exacerbation: Secondary | ICD-10-CM | POA: Diagnosis not present

## 2019-01-27 MED ORDER — PREDNISONE 10 MG PO TABS: ORAL_TABLET | ORAL | 0 refills | Status: DC

## 2019-01-27 NOTE — Addendum Note (Signed)
Addended by: Maurene Capes on: 01/27/2019 11:47 AM   Modules accepted: Orders

## 2019-01-27 NOTE — Patient Instructions (Signed)
COPD with acute exacerbation: I am glad that the EKG from a few weeks ago showed no evidence of problems from the azithromycin Continue daily azithromycin for another 3 months, we will plan on 6 months total Prednisone: Take 30 mg daily x5 days, then go back to taking 10 mg daily Continue Singulair and Spiriva Practice good hand hygiene Stay physically active  We will see you back in 3 months or sooner if need

## 2019-01-27 NOTE — Progress Notes (Signed)
Subjective:    Patient ID: Theodore [name], male    DOB: December 04, 1955, 63 y.o.   MRN: [id number]  Synopsis: Has stage IV COPD (steroid dependent) and history of a pulmonary embolism after a hospitalization in 2015 formerly followed by Dr. Sherene Sires.  - Smoked 3 packs a day at the most, decreased to 1 ppd for a "few years", he quit altogether since 2015 -second lifetime pulmomary embolism in January 2018 -Try daily azithromycin, stopped in early 2019 for his personal concern over risk of arrhythmia side effects, resumed daily azithromycin in 2020. -Hip fracture 2019, likely related to prednisone use  HPI Chief Complaint  Patient presents with  . Follow-up    2 month follow up for COPD. Has a non-productive cough. Increased fatigue.    Theodore [name] returns to clinic today for follow-up after he recently started taking azithromycin again.  He says that his cough had actually improved after he started this, he was coughing up less mucus.  His breathing was about the same until today.  He says he woke up this morning and has had increasing tightness and shortness of breath.  He has not had a fever or chills.  His sister was sick about a month ago with a cough but has not been around him recently as she was self isolating.  He has self titrated up his prednisone to 10 mg daily.  Past Medical History:  Diagnosis Date  . Anxiety   . Arthritis    "back" (01/24/2014)  . Asthma   . Chronic bronchitis (HCC)   . Cluster headache    "last bout was summer 2014; had them q spring 1991-2001" (01/24/2014)  . COPD (chronic obstructive pulmonary disease) (HCC)   . GERD (gastroesophageal reflux disease)   . NSTEMI (non-ST elevated myocardial infarction) (HCC) 01/2014  . On home oxygen therapy    "2L; 24/7" (01/24/2014)  . Pneumonia 06/2013; 01/2014  . Pulmonary embolism (HCC) 01/23/2014  . Seasonal allergies       Review of Systems  Constitutional: Positive for fatigue. Negative for chills and fever.  HENT:  Negative for postnasal drip, rhinorrhea and sinus pressure.   Respiratory: Positive for chest tightness, shortness of breath and wheezing.   Cardiovascular: Positive for leg swelling. Negative for chest pain and palpitations.       Objective:   Physical Exam  Vitals:   01/27/19 1108  BP: 128/80  Pulse: (!) 111  Temp: 98.4 F (36.9 C)  TempSrc: Oral  SpO2: 95%  Weight: 222 lb (100.7 kg)  Height: 6\' 4"  (1.93 m)   RA  Gen: chronically ill appearing HENT: OP clear, TM's clear, neck supple PULM: Poor air movement, wheezing bilaterally B, normal percussion CV: RRR, no mgr, trace edema GI: BS+, soft, nontender Derm: no cyanosis or rash Psyche: normal mood and affect    PFT - PFT's 12/02/2011  FEV1  0.94 (24%) ratio 39 -  18% response to B 2 and DLCO 60%  Lab  -Alpha 1 genotype sent 03/01/12 >  MM    Echo: January 2018 echocardiogram reviewed showing a normal LVEF but mild pulmonary hypertension  Records from his last visit with [place] reviewed where he was started on Singulair and prednisone was tapered further.     Assessment & Plan:     COPD with acute exacerbation (HCC)  Chronic respiratory failure, unspecified whether with hypoxia or hypercapnia (HCC)  Atrial fibrillation, unspecified type (HCC)  Discussion: Theodore [name] unfortunately is having another exacerbation of his severe  chronic COPD.  He has titrated up his prednisone to 10 mg daily and now is having another flareup.  He has not had a fever or recent sick contacts aside from his sister being sick about a month ago.  If he has a fever he would need to be tested for influenza, viral pathogens including coronavirus but at this point I do not think that is necessary.  Plan: Chronic respiratory failure with hypoxemia: Continue 2L O2 with exertion  Atrial fib: Continue f/u with cardiology  COPD with acute exacerbation: I am glad that the EKG from a few weeks ago showed no evidence of problems from the azithromycin  Continue daily azithromycin for another 3 months, we will plan on 6 months total Prednisone: Take 30 mg daily x5 days, then go back to taking 10 mg daily Continue Singulair and Spiriva Practice good hand hygiene Stay physically active  We will see you back in 3 months or sooner if need   Current Outpatient Medications:  .  albuterol (PROVENTIL) (2.5 MG/3ML) 0.083% nebulizer solution, Take 3 mLs (2.5 mg total) by nebulization every 6 (six) hours as needed for wheezing or shortness of breath., Disp: 360 mL, Rfl: 11 .  azithromycin (ZITHROMAX) 250 MG tablet, Take 1 tablet (250 mg total) by mouth daily., Disp: 30 tablet, Rfl: 1 .  budesonide-formoterol (SYMBICORT) 160-4.5 MCG/ACT inhaler, INHALE 2 PUFFS FIRST THING IN THE MORNING AND ANOTHER 2 PUFFS ABOUT 12 HOURS LATER, Disp: 10.2 g, Rfl: 11 .  chlorpheniramine-HYDROcodone (TUSSIONEX PENNKINETIC ER) 10-8 MG/5ML SUER, Take 5 mLs by mouth every 12 (twelve) hours as needed for cough., Disp: 140 mL, Rfl: 0 .  fluticasone (FLONASE) 50 MCG/ACT nasal spray, Place 1 spray into both nostrils daily., Disp: , Rfl:  .  guaiFENesin (MUCINEX) 600 MG 12 hr tablet, Take 1,200 mg by mouth 2 (two) times daily. , Disp: , Rfl:  .  montelukast (SINGULAIR) 10 MG tablet, Take 1 tablet (10 mg total) by mouth at bedtime., Disp: 30 tablet, Rfl: 11 .  Multiple Vitamin (MULTI VITAMIN PO), Take 1 tablet by mouth daily., Disp: , Rfl:  .  nicotine polacrilex (NICORETTE) 2 MG gum, Take 2 mg by mouth as needed for smoking cessation., Disp: , Rfl:  .  OXYGEN, Inhale 2 L/min into the lungs as needed. For sats less than 90%, Disp: , Rfl:  .  predniSONE (DELTASONE) 5 MG tablet, Take 1.5 tablets (7.5 mg total) by mouth daily with breakfast. Take 1 and a 1/2 tablets daily, Disp: 45 tablet, Rfl: 1 .  PROAIR HFA 108 (90 Base) MCG/ACT inhaler, INHALE 2 PUFFS BY MOUTH EVERY 6 HOURS AS NEEDED FOR WHEEZING OR SHORTNESS OF BREATH, Disp: 8.5 g, Rfl: 11 .  sodium chloride (OCEAN) 0.65 % SOLN  nasal spray, Place 1 spray into both nostrils 4 (four) times daily as needed for congestion., Disp: , Rfl:  .  tiotropium (SPIRIVA HANDIHALER) 18 MCG inhalation capsule, INHALE THE CONTENTS OF 1 CAPSULE VIA INHALATION DEVICE EVERY DAY, Disp: 30 capsule, Rfl: 11 .  triamterene-hydrochlorothiazide (MAXZIDE-25) 37.5-25 MG tablet, TAKE 1 TABLET BY MOUTH DAILY, Disp: 90 tablet, Rfl: 2 .  XARELTO 20 MG TABS tablet, TAKE 1 TABLET(20 MG) BY MOUTH EVERY EVENING, Disp: 30 tablet, Rfl: 5

## 2019-02-02 ENCOUNTER — Other Ambulatory Visit: Payer: Self-pay | Admitting: Pulmonary Disease

## 2019-02-20 ENCOUNTER — Ambulatory Visit: Payer: Self-pay | Admitting: Nurse Practitioner

## 2019-02-26 ENCOUNTER — Other Ambulatory Visit: Payer: Self-pay | Admitting: Acute Care

## 2019-03-02 ENCOUNTER — Other Ambulatory Visit: Payer: Self-pay | Admitting: Pulmonary Disease

## 2019-03-02 ENCOUNTER — Telehealth: Payer: Self-pay | Admitting: *Deleted

## 2019-03-02 NOTE — Telephone Encounter (Signed)
Called pt to r/s 03/10/19 6 mo f/u with Claris Gower due to COVID19. OV r/s to 04/12/19.  Patient is requesting statin med now per previous recommendations. See 08/18/18 lipid labs. Please advise. Thanks!

## 2019-03-02 NOTE — Telephone Encounter (Signed)
Can have video visit to do this.

## 2019-03-03 NOTE — Telephone Encounter (Signed)
Can you get patient set up for a virtual visit with Dr. Okey Dupre to discuss. If patient is unable to do so Dr. Okey Dupre states that it is not urgent to start medication right this moment and waiting till his in office visit scheduled for June is okay

## 2019-03-03 NOTE — Telephone Encounter (Signed)
Noted thanks °

## 2019-03-03 NOTE — Telephone Encounter (Signed)
Patient does not have access to do a V visit.  He will wait until his next appt.

## 2019-03-07 ENCOUNTER — Ambulatory Visit: Payer: PPO | Admitting: Podiatry

## 2019-03-08 ENCOUNTER — Ambulatory Visit: Payer: Self-pay | Admitting: Nurse Practitioner

## 2019-03-10 ENCOUNTER — Ambulatory Visit: Payer: Self-pay | Admitting: Nurse Practitioner

## 2019-04-04 ENCOUNTER — Other Ambulatory Visit: Payer: Self-pay | Admitting: Pulmonary Disease

## 2019-04-11 ENCOUNTER — Other Ambulatory Visit: Payer: Self-pay | Admitting: Pulmonary Disease

## 2019-04-12 ENCOUNTER — Ambulatory Visit (INDEPENDENT_AMBULATORY_CARE_PROVIDER_SITE_OTHER): Payer: PPO | Admitting: Internal Medicine

## 2019-04-12 ENCOUNTER — Encounter: Payer: Self-pay | Admitting: Internal Medicine

## 2019-04-12 ENCOUNTER — Ambulatory Visit: Payer: Self-pay | Admitting: Nurse Practitioner

## 2019-04-12 DIAGNOSIS — R7303 Prediabetes: Secondary | ICD-10-CM | POA: Diagnosis not present

## 2019-04-12 DIAGNOSIS — E538 Deficiency of other specified B group vitamins: Secondary | ICD-10-CM | POA: Diagnosis not present

## 2019-04-12 DIAGNOSIS — E611 Iron deficiency: Secondary | ICD-10-CM

## 2019-04-12 DIAGNOSIS — I10 Essential (primary) hypertension: Secondary | ICD-10-CM

## 2019-04-12 DIAGNOSIS — E559 Vitamin D deficiency, unspecified: Secondary | ICD-10-CM | POA: Diagnosis not present

## 2019-04-12 DIAGNOSIS — Z0000 Encounter for general adult medical examination without abnormal findings: Secondary | ICD-10-CM

## 2019-04-12 MED ORDER — TRIAMTERENE-HCTZ 37.5-25 MG PO TABS: 1.0000 | ORAL_TABLET | Freq: Every day | ORAL | 1 refills | Status: DC

## 2019-04-12 MED ORDER — BUDESONIDE-FORMOTEROL FUMARATE 160-4.5 MCG/ACT IN AERO: INHALATION_SPRAY | RESPIRATORY_TRACT | 5 refills | Status: DC

## 2019-04-12 MED ORDER — RIVAROXABAN 20 MG PO TABS: ORAL_TABLET | ORAL | 5 refills | Status: DC

## 2019-04-12 MED ORDER — TIOTROPIUM BROMIDE MONOHYDRATE 18 MCG IN CAPS: ORAL_CAPSULE | RESPIRATORY_TRACT | 5 refills | Status: DC

## 2019-04-12 MED ORDER — AZITHROMYCIN 250 MG PO TABS: 250.0000 mg | ORAL_TABLET | Freq: Every day | ORAL | 0 refills | Status: DC

## 2019-04-12 NOTE — Progress Notes (Signed)
Patient ID: Theodore GuestJames W [name], male   DOB: 1956-07-02, 63 y.o.   MRN: [id number]  Virtual Visit via Video Note  I connected with Theodore [name] on 04/12/19 at  9:20 AM EDT by a video enabled telemedicine application and verified that I am speaking with the correct person using two identifiers.  Location: Patient: at home Provider: at office   I discussed the limitations of evaluation and management by telemedicine and the availability of in person appointments. The patient expressed understanding and agreed to proceed.  History of Present Illness: Here for wellness and establish new PCP;  Overall doing ok;  Pt denies Chest pain, worsening SOB, DOE, wheezing, orthopnea, PND, worsening LE edema, palpitations, dizziness or syncope, though did wake up a bit tight today and has a chronic congested cough.  Good med compliance and plans to ask for antibiotic and prednisone refills per pulmonary.  Pt denies neurological change such as new headache, facial or extremity weakness.  Pt denies polydipsia, polyuria, or low sugar symptoms. Pt states overall good compliance with treatment and medications, good tolerability, and has been trying to follow appropriate diet.  Pt denies worsening depressive symptoms, suicidal ideation or panic. No fever, night sweats, wt loss, loss of appetite, or other constitutional symptoms.  Pt states good ability with ADL's, has low fall risk, home safety reviewed and adequate, no other significant changes in hearing or vision, and only occasionally active with exercise, has been less active with the pandemic, fortunately wt has overall been about the same about 221.  Admits to not always wearing the compression stockings.  On chronic low dose prednisone, has not been checking sugars in the past but willing to do this.  Currently only using the Home o2 2L prn.   Past Medical History:  Diagnosis Date  . Anxiety   . Arthritis    "back" (01/24/2014)  . Asthma   . Chronic bronchitis  (HCC)   . Cluster headache    "last bout was summer 2014; had them q spring 1991-2001" (01/24/2014)  . COPD (chronic obstructive pulmonary disease) (HCC)   . GERD (gastroesophageal reflux disease)   . NSTEMI (non-ST elevated myocardial infarction) (HCC) 01/2014  . On home oxygen therapy    "2L; 24/7" (01/24/2014)  . Pneumonia 06/2013; 01/2014  . Pulmonary embolism (HCC) 01/23/2014  . Seasonal allergies    Past Surgical History:  Procedure Laterality Date  . COLONOSCOPY WITH PROPOFOL N/A 10/20/2016   Procedure: COLONOSCOPY WITH PROPOFOL;  Surgeon: Sherrilyn RistHenry L Danis III, MD;  Location: [facility];  Service: Gastroenterology;  Laterality: N/A;  . NASAL SEPTOPLASTY W/ TURBINOPLASTY Left 1983  . TOTAL HIP ARTHROPLASTY Left 07/16/2017   Procedure: TOTAL HIP ARTHROPLASTY ANTERIOR APPROACH;  Surgeon: Tarry KosXu, Naiping M, MD;  Location: [facility];  Service: Orthopedics;  Laterality: Left;  . TURBINATE REDUCTION Bilateral ~ 1983    reports that he quit smoking about 7 years ago. His smoking use included cigarettes. He has a 70.00 pack-year smoking history. He has never used smokeless tobacco. He reports current alcohol use. He reports that he does not use drugs. family history includes Asthma in his father and sister; Colon cancer in his cousin; Emphysema in his sister; Heart disease in his father and sister; High blood pressure in his mother; Leukemia in his father. Allergies  Allergen Reactions  . Naproxen Sodium Itching   Current Outpatient Medications on File Prior to Visit  Medication Sig Dispense Refill  . albuterol (PROVENTIL) (2.5 MG/3ML) 0.083% nebulizer solution Take  3 mLs (2.5 mg total) by nebulization every 6 (six) hours as needed for wheezing or shortness of breath. 360 mL 11  . fluticasone (FLONASE) 50 MCG/ACT nasal spray Place 1 spray into both nostrils daily.    Marland Kitchen guaiFENesin (MUCINEX) 600 MG 12 hr tablet Take 1,200 mg by mouth 2 (two) times daily.     . montelukast (SINGULAIR) 10 MG tablet Take 1  tablet (10 mg total) by mouth at bedtime. 30 tablet 11  . Multiple Vitamin (MULTI VITAMIN PO) Take 1 tablet by mouth daily.    . nicotine polacrilex (NICORETTE) 2 MG gum Take 2 mg by mouth as needed for smoking cessation.    . OXYGEN Inhale 2 L/min into the lungs as needed. For sats less than 90%    . PROAIR HFA 108 (90 Base) MCG/ACT inhaler INHALE 2 PUFFS BY MOUTH EVERY 6 HOURS AS NEEDED FOR WHEEZING OR SHORTNESS OF BREATH 8.5 g 11  . sodium chloride (OCEAN) 0.65 % SOLN nasal spray Place 1 spray into both nostrils 4 (four) times daily as needed for congestion.     No current facility-administered medications on file prior to visit.     Observations/Objective: Alert, NAD, appropriate mood and affect, resps normal, cn 2-12 intact, moves all 4s, no visible rash or swelling Lab Results  Component Value Date   WBC 11.1 (H) 08/18/2018   HGB 14.9 08/18/2018   HCT 44.5 08/18/2018   PLT 423.0 (H) 08/18/2018   GLUCOSE 86 08/18/2018   CHOL 215 (H) 08/18/2018   TRIG 103.0 08/18/2018   HDL 68.10 08/18/2018   LDLCALC 126 (H) 08/18/2018   ALT 17 08/18/2018   AST 16 08/18/2018   NA 132 (L) 08/18/2018   K 4.1 08/18/2018   CL 93 (L) 08/18/2018   CREATININE 0.93 08/18/2018   BUN 12 08/18/2018   CO2 30 08/18/2018   TSH 1.20 09/03/2014   PSA 0.74 08/18/2018   INR 1.15 07/16/2017   HGBA1C 5.6 08/18/2018   MICROALBUR 0.8 04/30/2016   Assessment and Plan: See notes  Follow Up Instructions: See notes   I discussed the assessment and treatment plan with the patient. The patient was provided an opportunity to ask questions and all were answered. The patient agreed with the plan and demonstrated an understanding of the instructions.   The patient was advised to call back or seek an in-person evaluation if the symptoms worsen or if the condition fails to improve as anticipated.  Oliver Barre, MD

## 2019-04-12 NOTE — Assessment & Plan Note (Signed)
Also fro a1c with labs

## 2019-04-12 NOTE — Patient Instructions (Signed)
Please continue all other medications as before, and refills have been done if requested.  Please have the pharmacy call with any other refills you may need.  Please continue your efforts at being more active, low cholesterol diet, and weight control.  You are otherwise up to date with prevention measures today.  Please keep your appointments with your specialists as you may have planned  Please go to the LAB in the Basement (turn left off the elevator) for the tests to be done at your convenience  You will be contacted by phone if any changes need to be made immediately.  Otherwise, you will receive a letter about your results with an explanation, but please check with MyChart first.  Please remember to sign up for MyChart if you have not done so, as this will be important to you in the future with finding out test results, communicating by private email, and scheduling acute appointments online when needed.  Please return in 6 months, or sooner if needed 

## 2019-04-12 NOTE — Assessment & Plan Note (Signed)

## 2019-04-21 ENCOUNTER — Other Ambulatory Visit: Payer: Self-pay | Admitting: Pulmonary Disease

## 2019-05-05 ENCOUNTER — Encounter: Payer: Self-pay | Admitting: Podiatry

## 2019-05-05 ENCOUNTER — Other Ambulatory Visit: Payer: Self-pay

## 2019-05-05 ENCOUNTER — Other Ambulatory Visit: Payer: Self-pay | Admitting: Pulmonary Disease

## 2019-05-05 ENCOUNTER — Ambulatory Visit (INDEPENDENT_AMBULATORY_CARE_PROVIDER_SITE_OTHER): Payer: PPO | Admitting: Podiatry

## 2019-05-05 VITALS — Temp 98.4°F

## 2019-05-05 DIAGNOSIS — M79674 Pain in right toe(s): Secondary | ICD-10-CM

## 2019-05-05 DIAGNOSIS — B351 Tinea unguium: Secondary | ICD-10-CM | POA: Diagnosis not present

## 2019-05-05 DIAGNOSIS — M79675 Pain in left toe(s): Secondary | ICD-10-CM | POA: Diagnosis not present

## 2019-05-05 NOTE — Telephone Encounter (Signed)
Per Dr. Luane School last note (see below) patient was to continue on azithromycin until June. Please go ahead and refill and make follow up appointment with APP. Patient was last seen by [facility].   COPD with acute exacerbation: I am glad that the EKG from a few weeks ago showed no evidence of problems from the azithromycin Continue daily azithromycin for another 3 months, we will plan on 6 months total Prednisone: Take 30 mg daily x5 days, then go back to taking 10 mg daily Continue Singulair and Spiriva Practice good hand hygiene Stay physically active

## 2019-05-05 NOTE — Telephone Encounter (Signed)
Is this appropriate for refill ? 

## 2019-05-05 NOTE — Patient Instructions (Signed)

## 2019-05-13 NOTE — Progress Notes (Signed)
Subjective:  Theodore [name] presents to clinic today with cc of  painful, thick, discolored, elongated toenails 1-5 b/l that become tender and cannot cut because of thickness.  Pain is aggravated when wearing enclosed shoe gear.   Current Outpatient Medications:  .  albuterol (PROVENTIL) (2.5 MG/3ML) 0.083% nebulizer solution, Take 3 mLs (2.5 mg total) by nebulization every 6 (six) hours as needed for wheezing or shortness of breath., Disp: 360 mL, Rfl: 11 .  azithromycin (ZITHROMAX) 250 MG tablet, TAKE 1 TABLET BY MOUTH DAILY, Disp: 30 tablet, Rfl: 0 .  budesonide-formoterol (SYMBICORT) 160-4.5 MCG/ACT inhaler, INHALE 2 PUFFS FIRST THING IN THE MORNING AND ANOTHER 2 PUFFS ABOUT 12 HOURS LATER, Disp: 10.2 g, Rfl: 5 .  fluticasone (FLONASE) 50 MCG/ACT nasal spray, Place 1 spray into both nostrils daily., Disp: , Rfl:  .  guaiFENesin (MUCINEX) 600 MG 12 hr tablet, Take 1,200 mg by mouth 2 (two) times daily. , Disp: , Rfl:  .  montelukast (SINGULAIR) 10 MG tablet, Take 1 tablet (10 mg total) by mouth at bedtime., Disp: 30 tablet, Rfl: 11 .  Multiple Vitamin (MULTI VITAMIN PO), Take 1 tablet by mouth daily., Disp: , Rfl:  .  nicotine polacrilex (NICORETTE) 2 MG gum, Take 2 mg by mouth as needed for smoking cessation., Disp: , Rfl:  .  OXYGEN, Inhale 2 L/min into the lungs as needed. For sats less than 90%, Disp: , Rfl:  .  PROAIR HFA 108 (90 Base) MCG/ACT inhaler, INHALE 2 PUFFS BY MOUTH EVERY 6 HOURS AS NEEDED FOR WHEEZING OR SHORTNESS OF BREATH, Disp: 8.5 g, Rfl: 11 .  rivaroxaban (XARELTO) 20 MG TABS tablet, TAKE 1 TABLET(20 MG) BY MOUTH EVERY EVENING, Disp: 30 tablet, Rfl: 5 .  sodium chloride (OCEAN) 0.65 % SOLN nasal spray, Place 1 spray into both nostrils 4 (four) times daily as needed for congestion., Disp: , Rfl:  .  tiotropium (SPIRIVA HANDIHALER) 18 MCG inhalation capsule, INHALE THE CONTENTS OF 1 CAPSULE VIA INHALATION DEVICE EVERY DAY, Disp: 30 capsule, Rfl: 5 .   triamterene-hydrochlorothiazide (MAXZIDE-25) 37.5-25 MG tablet, Take 1 tablet by mouth daily., Disp: 90 tablet, Rfl: 1   Allergies  Allergen Reactions  . Naproxen Sodium Itching     Objective: Vitals:   05/05/19 0933  Temp: 98.4 F (36.9 C)    Physical Examination:  Vascular Examination: Capillary refill time immediate x 10 digits.  Palpable DP/PT pulses nonpalpable due to edema b/l.  Digital hair absent b/l.  Skin temperature gradient WNL b/l.  +2-3 LE edema b/l. No erythema, no blisters, no weeping.  Dermatological Examination: Skin warm and dry b/l.  No open wounds b/l.  No interdigital macerations noted b/l.  Elongated, thick, discolored brittle toenails with subungual debris and pain on dorsal palpation of nailbeds 1-5 b/l.  Musculoskeletal Examination: Muscle strength 5/5 to all muscle groups b/l  No pain, crepitus or joint discomfort with active/passive ROM.  Neurological Examination: Sensation intact 5/5 b/l with 10 gram monofilament.  Vibratory sensation intact b/l.  Proprioceptive sensation intact b/l.  Assessment: Mycotic nail infection with pain 1-5 b/l  Plan: 1. Toenails 1-5 b/l were debrided in length and girth without iatrogenic laceration. 2.  Continue soft, supportive shoe gear daily. 3.  Report any pedal injuries to medical professional. 4.  Follow up 3 months. 5.  Patient/POA to call should there be a question/concern in there interim.

## 2019-05-27 ENCOUNTER — Other Ambulatory Visit: Payer: Self-pay | Admitting: Pulmonary Disease

## 2019-06-08 ENCOUNTER — Other Ambulatory Visit: Payer: Self-pay | Admitting: Pulmonary Disease

## 2019-06-25 ENCOUNTER — Other Ambulatory Visit: Payer: Self-pay | Admitting: Primary Care

## 2019-06-29 ENCOUNTER — Other Ambulatory Visit: Payer: Self-pay | Admitting: Nurse Practitioner

## 2019-07-03 NOTE — Progress Notes (Signed)
 @Patient  ID: Theodore [name], male    DOB: 1956/10/22, 63 y.o.   MRN: [id number]  Chief Complaint  Patient presents with  . Follow-up    routine f/up - not as many 'good days' - SOB progressing - cong cough white/clear - some wheezing daily    Referring provider: [name], Lennix W, MD   Synopsis: Has stage IV COPD (steroid dependent) and history of a pulmonary embolism after a hospitalization in 2015 formerly followed by Dr. Sherene SiresWert.  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 PMH significant for COPD GOLD IV, chronic respiratory failure, allergic rhinitis, CAD, PE, cardiac arrhythmia, MI, multiple pulmonary nodules, pre-diabetes, left total hip replacement. Patient of Dr. Kendrick FriesMcQuaid, last seen on 01/27/19. Maintained on Spiriva, Singulair, azithromycin, prednisone 10mg  daily. Chronic oxygen 2L on exertion. Recommended follow up in 3 months.   07/05/2019 Patient presents today 3-5 month follow-up visit. Breathing is no worse but feels it is progressing. Chronic cough with mucus production. Not much color. Still taking azithromycin but not the last couple of days because he ran out. Compliant with Symbicort and Spiriva. Using nebulizer every 6 hours. Prednisone is at 10mg  daily. Not currently using oxygen. Has a concentrator at home but needs service. Occasionally misses mucinex dosing.   Significant testing: PFT's 12/02/2011  FEV1  0.94 (24%) ratio 39 -  18% response to B 2 and DLCO 60%  Lab -Alpha 1 genotype sent 03/01/12 >  MM   Echo: January 2018 echocardiogram reviewed showing a normal LVEF but mild pulmonary hypertension  Allergies  Allergen Reactions   . Naproxen Sodium Itching    Immunization History  Administered Date(s) Administered  . Fluad Quad(high Dose 65+) 07/05/2019  . Influenza Split 08/10/2011, 07/27/2012  . Influenza,inj,Quad PF,6+ Mos 07/14/2013, 08/06/2014, 10/17/2015, 07/21/2016, 08/26/2017, 07/28/2018  . PPD Test 07/22/2017, 07/29/2017  . Pneumococcal Conjugate-13 08/06/2014  . Pneumococcal Polysaccharide-23 02/05/2012  . Tdap 09/03/2014, 07/14/2017    Past Medical History:  Diagnosis Date  . Anxiety   . Arthritis    "back" (01/24/2014)  . Asthma   . Chronic bronchitis (HCC)   . Cluster headache    "last bout was summer 2014; had them q spring 1991-2001" (01/24/2014)  . COPD (chronic obstructive pulmonary disease) (HCC)   . GERD (gastroesophageal reflux disease)   . NSTEMI (non-ST elevated myocardial infarction) (HCC) 01/2014  . On home oxygen therapy    "2L; 24/7" (01/24/2014)  . Pneumonia 06/2013; 01/2014  . Pulmonary embolism (HCC) 01/23/2014  . Seasonal allergies     Tobacco History: Social History   Tobacco Use  Smoking Status Former Smoker  . Packs/day: 2.00  . Years: 35.00  . Pack years: 70.00  . Types: Cigarettes  . Quit date: 01/24/2012  . Years since quitting: 7.4  Smokeless Tobacco Never Used  Tobacco Comment   Remain smoke free   Counseling given: Not Answered Comment: Remain smoke free   Outpatient Medications Prior to Visit  Medication Sig Dispense Refill  . albuterol (PROVENTIL) (2.5 MG/3ML) 0.083% nebulizer solution Take 3 mLs (2.5 mg total) by nebulization every 6 (six) hours as needed for wheezing or shortness of breath.  360 mL 11  . budesonide-formoterol (SYMBICORT) 160-4.5 MCG/ACT inhaler INHALE 2 PUFFS FIRST THING IN THE MORNING AND ANOTHER 2 PUFFS ABOUT 12 HOURS LATER 10.2 g 5  . fluticasone (FLONASE) 50 MCG/ACT nasal spray Place 1 spray into both nostrils daily.    . montelukast (SINGULAIR) 10 MG tablet TAKE 1 TABLET(10 MG) BY MOUTH AT BEDTIME 30 tablet 3  . Multiple Vitamin  (MULTI VITAMIN PO) Take 1 tablet by mouth daily.    . nicotine polacrilex (NICORETTE) 2 MG gum Take 2 mg by mouth as needed for smoking cessation.    . OXYGEN Inhale 2 L/min into the lungs as needed. For sats less than 90%    . predniSONE (DELTASONE) 10 MG tablet TAKE 1 TABLET BY MOUTH DAILY 30 tablet 1  . PROAIR HFA 108 (90 Base) MCG/ACT inhaler INHALE 2 PUFFS BY MOUTH EVERY 6 HOURS AS NEEDED FOR WHEEZING OR SHORTNESS OF BREATH 8.5 g 11  . rivaroxaban (XARELTO) 20 MG TABS tablet TAKE 1 TABLET(20 MG) BY MOUTH EVERY EVENING 30 tablet 5  . sodium chloride (OCEAN) 0.65 % SOLN nasal spray Place 1 spray into both nostrils 4 (four) times daily as needed for congestion.    Marland Kitchen. tiotropium (SPIRIVA HANDIHALER) 18 MCG inhalation capsule INHALE THE CONTENTS OF 1 CAPSULE VIA INHALATION DEVICE EVERY DAY 30 capsule 5  . triamterene-hydrochlorothiazide (MAXZIDE-25) 37.5-25 MG tablet Take 1 tablet by mouth daily. 90 tablet 1  . azithromycin (ZITHROMAX) 250 MG tablet TAKE 1 TABLET BY MOUTH DAILY 30 tablet 0  . guaiFENesin (MUCINEX) 600 MG 12 hr tablet Take 1,200 mg by mouth 2 (two) times daily.      No facility-administered medications prior to visit.     Review of Systems  Review of Systems  Constitutional: Negative.   Respiratory: Positive for cough, shortness of breath and wheezing.   Cardiovascular: Negative.    Physical Exam  BP 138/72 (BP Location: Left Arm, Patient Position: Sitting, Cuff Size: Normal)   Pulse 98   Temp 98.3 F (36.8 C)   Ht 6\' 4"  (1.93 m)   Wt 225 lb 6.4 oz (102.2 kg)   SpO2 94%   BMI 27.44 kg/m  Physical Exam Constitutional:      Appearance: Normal appearance.     Comments: Chronically ill appearing   HENT:     Head: Normocephalic and atraumatic.  Cardiovascular:     Rate and Rhythm: Normal rate and regular rhythm.  Pulmonary:     Effort: Pulmonary effort is normal. No respiratory distress.     Breath sounds: No stridor. Wheezing and rhonchi present.   Musculoskeletal: Normal range of motion.  Neurological:     General: No focal deficit present.     Mental Status: He is alert and oriented to person, place, and time. Mental status is at baseline.  Psychiatric:        Mood and Affect: Mood normal.        Behavior: Behavior normal.        Thought Content: Thought content normal.        Judgment: Judgment normal.      Lab Results:  CBC    Component Value Date/Time   WBC 11.1 (H) 08/18/2018 1144   RBC 4.72 08/18/2018 1144   HGB 14.9 08/18/2018 1144   HCT 44.5 08/18/2018 1144   PLT 423.0 (H) 08/18/2018 1144   MCV 94.3 08/18/2018 1144   MCH 30.4 07/19/2017 0248   MCHC 33.5 08/18/2018 1144   RDW 13.9 08/18/2018  1144   LYMPHSABS 1.8 07/14/2017 1101   MONOABS 0.6 07/14/2017 1101   EOSABS 0.1 07/14/2017 1101   BASOSABS 0.0 07/14/2017 1101    BMET    Component Value Date/Time   NA 132 (L) 08/18/2018 1144   NA 140 08/07/2017   K 4.1 08/18/2018 1144   CL 93 (L) 08/18/2018 1144   CO2 30 08/18/2018 1144   GLUCOSE 86 08/18/2018 1144   BUN 12 08/18/2018 1144   BUN 12 08/07/2017   CREATININE 0.93 08/18/2018 1144   CALCIUM 9.4 08/18/2018 1144   GFRNONAA >60 07/21/2017 0813   GFRAA >60 07/21/2017 0813    BNP No results found for: BNP  ProBNP    Component Value Date/Time   PROBNP 38.0 08/18/2018 1144    Imaging: No results found.   Assessment & Plan:   COPD GOLD IV  - Change Azithromycin to Monday/Wednesday/Friday (EKG in March 2020 normal) - Continue Symbicort 2 puffs twice daily; Spiriva 2 puffs once daily - Continue Singulair 10mg  at bedtime - Take 20mg  prednisone x 1 week then return to 10mg  daily  - Use flutter valve twice daily for chest congestion  - Take mucinex twice daily  - Received high dose influenza vaccine today - Checking LFTs for drug monitoring - FU in 3 months with new pulmonary MD (prior McQuaid patient)   Chronic respiratory failure with hypoxia ([place]) - Ambulatory O2 with no desaturation  today but patient was unable to complete 1 lap d/t fatigue  - Continue 2L on exertion as needed to keep O2 >88-90%  - Referral for supplies      Martyn Ehrich, NP 07/05/2019

## 2019-07-05 ENCOUNTER — Encounter: Payer: Self-pay | Admitting: Primary Care

## 2019-07-05 ENCOUNTER — Ambulatory Visit (INDEPENDENT_AMBULATORY_CARE_PROVIDER_SITE_OTHER): Payer: PPO | Admitting: Primary Care

## 2019-07-05 ENCOUNTER — Telehealth: Payer: Self-pay | Admitting: Primary Care

## 2019-07-05 ENCOUNTER — Other Ambulatory Visit: Payer: Self-pay

## 2019-07-05 VITALS — BP 138/72 | HR 98 | Temp 98.3°F | Ht 76.0 in | Wt 225.4 lb

## 2019-07-05 DIAGNOSIS — J9611 Chronic respiratory failure with hypoxia: Secondary | ICD-10-CM

## 2019-07-05 DIAGNOSIS — J9610 Chronic respiratory failure, unspecified whether with hypoxia or hypercapnia: Secondary | ICD-10-CM | POA: Diagnosis not present

## 2019-07-05 DIAGNOSIS — J449 Chronic obstructive pulmonary disease, unspecified: Secondary | ICD-10-CM

## 2019-07-05 LAB — HEPATIC FUNCTION PANEL
ALT: 14 U/L (ref 0–53)
AST: 16 U/L (ref 0–37)
Albumin: 4.1 g/dL (ref 3.5–5.2)
Alkaline Phosphatase: 63 U/L (ref 39–117)
Bilirubin, Direct: 0.1 mg/dL (ref 0.0–0.3)
Total Bilirubin: 0.5 mg/dL (ref 0.2–1.2)
Total Protein: 6.9 g/dL (ref 6.0–8.3)

## 2019-07-05 MED ORDER — AZITHROMYCIN 250 MG PO TABS: ORAL_TABLET | ORAL | 3 refills | Status: DC

## 2019-07-05 MED ORDER — GUAIFENESIN ER 600 MG PO TB12: 1200.0000 mg | ORAL_TABLET | Freq: Two times a day (BID) | ORAL | 3 refills | Status: DC

## 2019-07-05 NOTE — Telephone Encounter (Signed)
Returned call to [employer] regarding patient qualifying for oxygen. Spoke with Levada Dy who wanted to inform us they did receive an order to d/c oxygen in 2015 but never picked up the concentrator from the home.  Equipment has been at the patient's home for 5 years with no billing to the patient or insurance. The patient had reported at today's office visit that he maintained the concentrator due to DME not picking it up but he had not used it and had no supplies for it.    Attempt in office to do qualifying walk with patient to re-start oxygen and though patient was unable complete walk due to fatigue, he did not desaturate.  Provider B. Volanda Napoleon, NP ordered ONO and understand that order for supplies for re-start of O2 would not be accepted by insurance.  DME for oxygen supplies order cancelled and will proceed with ONO.  Nothing further needed at this time.

## 2019-07-05 NOTE — Telephone Encounter (Signed)
Order ONO on roomair for nocturnal dyspnea

## 2019-07-05 NOTE — Telephone Encounter (Signed)
Levada Dy called back and states on 05/04/14 [facility] sent an order for pts 02 to be d/c. They did go out and pick up their equipment. She says patient will have be re-qualified for 02 again.

## 2019-07-05 NOTE — Assessment & Plan Note (Addendum)
-   Change Azithromycin to Monday/Wednesday/Friday (EKG in March 2020 normal) - Continue Symbicort 2 puffs twice daily; Spiriva 2 puffs once daily - Continue Singulair 10mg  at bedtime - Take 20mg  prednisone x 1 week then return to 10mg  daily  - Use flutter valve twice daily for chest congestion  - Take mucinex twice daily  - Received high dose influenza vaccine today - Checking LFTs for drug monitoring - FU in 3 months with new pulmonary MD (prior McQuaid patient)

## 2019-07-05 NOTE — Assessment & Plan Note (Addendum)
-   Ambulatory O2 with no desaturation today but patient was unable to complete 1 lap d/t fatigue  - Continue 2L on exertion as needed to keep O2 >88-90%  - Referral for supplies

## 2019-07-05 NOTE — Telephone Encounter (Signed)
ONO order placed on today's OV encounter.  Nothing further needed.

## 2019-07-05 NOTE — Addendum Note (Signed)
Addended by: Joella Prince on: 07/05/2019 03:50 PM   Modules accepted: Orders

## 2019-07-05 NOTE — Patient Instructions (Addendum)
Office testing: Ambulatory O2- 95%  Office treatment: Received high dose influenza vaccine today  Orders: Labs today (LFTs)  COPD: Take 20mg  prednisone x 1 week then return to 10mg  daily  Use flutter valve twice daily for chest congestion  Take mucinex twice daily   Change Azithromycin to Monday/Wednesday/Friday (EKG in March 2020 normal) Continue Symbicort 2 puffs twice daily; Spiriva 2 puffs once daily Continue Singulair 10mg  at bedtime   Chronic respiratory failure: Continue 2L oxygen on exertion   Follow-up: 3 months with NEW pulmonary MD (former mcquaid patient) EKG at next visit

## 2019-07-06 ENCOUNTER — Telehealth: Payer: Self-pay

## 2019-07-06 ENCOUNTER — Telehealth: Payer: Self-pay | Admitting: Primary Care

## 2019-07-06 DIAGNOSIS — J9611 Chronic respiratory failure with hypoxia: Secondary | ICD-10-CM

## 2019-07-06 DIAGNOSIS — J449 Chronic obstructive pulmonary disease, unspecified: Secondary | ICD-10-CM

## 2019-07-06 DIAGNOSIS — J9610 Chronic respiratory failure, unspecified whether with hypoxia or hypercapnia: Secondary | ICD-10-CM

## 2019-07-06 NOTE — Telephone Encounter (Signed)
Revised notation on ONO order submitted.  Route back to [facility] to complete.

## 2019-07-06 NOTE — Telephone Encounter (Signed)
CM sent to [facility] w/ new ONO order.

## 2019-07-06 NOTE — Telephone Encounter (Signed)
Theodore [name] sent to [name], [place]; [name], [name]        The respiratory department advised they need how the ono is to be done on the rx, ie: ono on room air. Please have this done asap as they are waiting on this. I am on hold to leave a staff message right now. LOL   Previous Messages  ----- Message -----  From: [name]  Sent: 07/05/2019  4:22 PM EDT  To: [name], *  Subject: ONO                        DOB: [birth year]   Order placed by [name], NP.   Please advise,  [facility]

## 2019-07-06 NOTE — Telephone Encounter (Signed)
error 

## 2019-07-10 ENCOUNTER — Encounter: Payer: Self-pay | Admitting: Primary Care

## 2019-07-10 DIAGNOSIS — J449 Chronic obstructive pulmonary disease, unspecified: Secondary | ICD-10-CM | POA: Diagnosis not present

## 2019-07-10 DIAGNOSIS — R0902 Hypoxemia: Secondary | ICD-10-CM | POA: Diagnosis not present

## 2019-07-10 NOTE — Progress Notes (Signed)
Reviewed, agree 

## 2019-07-21 ENCOUNTER — Telehealth: Payer: Self-pay | Admitting: Primary Care

## 2019-07-21 DIAGNOSIS — J449 Chronic obstructive pulmonary disease, unspecified: Secondary | ICD-10-CM

## 2019-07-21 NOTE — Telephone Encounter (Signed)
Contacted patient regarding E. Volanda Napoleon, NP review of ONO results and recommendations.  Patient acknowledged understanding stating he used oxygen several years ago with [employer] as DME.  Patient states he 'owns' equipment that remains in the home, stating he has a concentrator and large oxygen tank.  He would like to see if he can use this equipment and have [employer] service it and get new supplies.   Order placed to [employer] for new O2 start and advised patient can discuss equipment with them when they contact him. Patient agreeable and had no further questions at this time.

## 2019-07-21 NOTE — Telephone Encounter (Signed)
Per [employer] will not cover PRN oxygen, order will need to say 1L at night and with exertion.   Beth please advise if we can update order. Thanks.

## 2019-07-21 NOTE — Telephone Encounter (Signed)
Yes of course, thank you.

## 2019-07-21 NOTE — Telephone Encounter (Signed)
Patient had ONO on 07/10/19 showed O2 low 87% with baseline 93% on room air. He qualifies for oxygen. Please place order. Needs 1L nocturnal oxygen and prn during the day if needed.

## 2019-07-21 NOTE — Telephone Encounter (Signed)
Order placed. Nothing further needed at this time. 

## 2019-07-21 NOTE — Telephone Encounter (Signed)
Before 5:30, can put order in Epic.

## 2019-07-24 ENCOUNTER — Telehealth: Payer: Self-pay | Admitting: Primary Care

## 2019-07-24 DIAGNOSIS — J9611 Chronic respiratory failure with hypoxia: Secondary | ICD-10-CM

## 2019-07-24 NOTE — Telephone Encounter (Signed)
Message routed to [name], NP  This patient was seen by you on 07/05/19.   Based on the response from [employer], should the patient be seen sooner and walked to see if the office notes will qualify him for oxygen use?   His next office visit with you is on 09/28/19.

## 2019-07-24 NOTE — Telephone Encounter (Signed)
No just nocturnal. I believe he walked and didn't qualify.

## 2019-07-25 NOTE — Telephone Encounter (Signed)
Called and spoke with Levada Dy at [facility] to make her aware of BW's recs.  Levada Dy states that a new order needs to be placed stating for pt to only need nocturnal O2.  This has been placed.  Levada Dy also states that they would not be able to service the pt's current setup, but would need to provide him with new equipment.  Per previous documentation, the concentrator that pt currently has at home was provided by [facility] at the time) and was never picked up by company when the O2 was d/c'ed by our office in 2015.    New order placed as requested by [employer].  Nothing further needed at this time- will close encounter.

## 2019-08-01 DIAGNOSIS — I2609 Other pulmonary embolism with acute cor pulmonale: Secondary | ICD-10-CM | POA: Diagnosis not present

## 2019-08-01 DIAGNOSIS — R0609 Other forms of dyspnea: Secondary | ICD-10-CM | POA: Diagnosis not present

## 2019-08-01 DIAGNOSIS — J9611 Chronic respiratory failure with hypoxia: Secondary | ICD-10-CM | POA: Diagnosis not present

## 2019-08-01 DIAGNOSIS — J449 Chronic obstructive pulmonary disease, unspecified: Secondary | ICD-10-CM | POA: Diagnosis not present

## 2019-08-01 DIAGNOSIS — J9610 Chronic respiratory failure, unspecified whether with hypoxia or hypercapnia: Secondary | ICD-10-CM | POA: Diagnosis not present

## 2019-08-02 ENCOUNTER — Other Ambulatory Visit: Payer: Self-pay | Admitting: Pulmonary Disease

## 2019-08-04 ENCOUNTER — Encounter: Payer: Self-pay | Admitting: Podiatry

## 2019-08-04 ENCOUNTER — Other Ambulatory Visit: Payer: Self-pay

## 2019-08-04 ENCOUNTER — Ambulatory Visit (INDEPENDENT_AMBULATORY_CARE_PROVIDER_SITE_OTHER): Payer: PPO | Admitting: Podiatry

## 2019-08-04 DIAGNOSIS — M79675 Pain in left toe(s): Secondary | ICD-10-CM

## 2019-08-04 DIAGNOSIS — B351 Tinea unguium: Secondary | ICD-10-CM

## 2019-08-04 DIAGNOSIS — M79674 Pain in right toe(s): Secondary | ICD-10-CM | POA: Diagnosis not present

## 2019-08-04 NOTE — Patient Instructions (Signed)

## 2019-08-08 NOTE — Progress Notes (Signed)
Subjective: Theodore [name] is seen today for follow up painful, elongated, thickened toenails 1-5 b/l feet that he cannot cut. Pain interferes with daily activities. Aggravating factor includes wearing enclosed shoe gear and relieved with periodic debridement.  Patient continues to take Xarelto.   Current Outpatient Medications on File Prior to Visit  Medication Sig  . albuterol (PROVENTIL) (2.5 MG/3ML) 0.083% nebulizer solution Take 3 mLs (2.5 mg total) by nebulization every 6 (six) hours as needed for wheezing or shortness of breath.  Marland Kitchen azithromycin (ZITHROMAX) 250 MG tablet Take 1 tab on Monday, Wednesday and Friday's  . budesonide-formoterol (SYMBICORT) 160-4.5 MCG/ACT inhaler INHALE 2 PUFFS FIRST THING IN THE MORNING AND ANOTHER 2 PUFFS ABOUT 12 HOURS LATER  . fluticasone (FLONASE) 50 MCG/ACT nasal spray Place 1 spray into both nostrils daily.  Marland Kitchen guaiFENesin (MUCINEX) 600 MG 12 hr tablet Take 2 tablets (1,200 mg total) by mouth 2 (two) times daily.  . montelukast (SINGULAIR) 10 MG tablet TAKE 1 TABLET(10 MG) BY MOUTH AT BEDTIME  . Multiple Vitamin (MULTI VITAMIN PO) Take 1 tablet by mouth daily.  . nicotine polacrilex (NICORETTE) 2 MG gum Take 2 mg by mouth as needed for smoking cessation.  . OXYGEN Inhale 2 L/min into the lungs as needed. For sats less than 90%  . predniSONE (DELTASONE) 10 MG tablet TAKE 1 TABLET BY MOUTH DAILY  . PROAIR HFA 108 (90 Base) MCG/ACT inhaler INHALE 2 PUFFS BY MOUTH EVERY 6 HOURS AS NEEDED FOR WHEEZING OR SHORTNESS OF BREATH  . rivaroxaban (XARELTO) 20 MG TABS tablet TAKE 1 TABLET(20 MG) BY MOUTH EVERY EVENING  . sodium chloride (OCEAN) 0.65 % SOLN nasal spray Place 1 spray into both nostrils 4 (four) times daily as needed for congestion.  Marland Kitchen tiotropium (SPIRIVA HANDIHALER) 18 MCG inhalation capsule INHALE THE CONTENTS OF 1 CAPSULE VIA INHALATION DEVICE EVERY DAY  . triamterene-hydrochlorothiazide (MAXZIDE-25) 37.5-25 MG tablet Take 1 tablet by mouth daily.    No current facility-administered medications on file prior to visit.      Allergies  Allergen Reactions  . Naproxen Sodium Itching   Objective:  Vascular Examination: Capillary refill time immediate x 10 digits.  Dorsalis pedis and Posterior tibial pulses nonpalpable due to edema.   Digital hair absent b/l.  Skin temperature gradient WNL b/l.   Dermatological Examination: Skin warm and dry b/l.  Toenails 1-5 b/l discolored, thick, dystrophic with subungual debris and pain with palpation to nailbeds due to thickness of nails.  Musculoskeletal: Muscle strength 5/5 to all LE muscle groups  No gross bony deformities b/l.  No pain, crepitus or joint limitation noted with ROM.   Neurological Examination: Protective sensation intact with 10 gram monofilament bilaterally.  Vibratory sensation intact bilaterally.   Assessment: Painful onychomycosis toenails 1-5 b/l   Plan: 1. Toenails 1-5 b/l were debrided in length and girth without iatrogenic bleeding. 2. Patient to continue soft, supportive shoe gear. 3. Patient to report any pedal injuries to medical professional immediately. 4. Follow up 3 months.  5. Patient/POA to call should there be a concern in the interim.

## 2019-08-22 NOTE — Progress Notes (Addendum)
Subjective:   Theodore [name] is a 63 y.o. male who presents for Medicare Annual/Subsequent preventive examination. I connected with patient by a telephone and verified that I am speaking with the correct person using two identifiers. Patient stated full name and DOB. Patient gave permission to continue with telephonic visit. Patient's location was at home and Nurse's location was at [place] office.   Review of Systems:   Cardiac Risk Factors include: advanced age (>61men, >46 women);dyslipidemia;male gender Sleep patterns: feels rested on waking, gets up 0-1 times nightly to void and sleeps 6-7 hours nightly.    Home Safety/Smoke Alarms: Feels safe in home. Smoke alarms in place.  Living environment; residence and Firearm Safety: 1-story house/ trailer. Lives with mother, no needs for DME, good support system Seat Belt Safety/Bike Helmet: Wears seat belt.      Objective:    Vitals: There were no vitals taken for this visit.  There is no height or weight on file to calculate BMI.  Advanced Directives 08/23/2019 08/18/2018 08/17/2017 07/29/2017 07/26/2017 07/22/2017 07/14/2017  Does Patient Have a Medical Advance Directive? Yes No Yes Yes Yes Yes No  Type of Paramedic of [place];Living will - Out of facility DNR (pink MOST or yellow form) Out of facility DNR (pink MOST or yellow form) Out of facility DNR (pink MOST or yellow form) Out of facility DNR (pink MOST or yellow form) -  Does patient want to make changes to medical advance directive? - - No - Patient declined No - Patient declined No - Patient declined No - Patient declined -  Copy of [place] in Chart? Yes - validated most recent copy scanned in chart (See row information) - - - - - -  Would patient like information on creating a medical advance directive? - Yes (ED - Information included in AVS) No - Patient declined No - Patient declined No - Patient declined No - Patient declined No -  Patient declined  Pre-existing out of facility DNR order (yellow form or pink MOST form) - - Yellow form placed in chart (order not valid for inpatient use) Yellow form placed in chart (order not valid for inpatient use) Yellow form placed in chart (order not valid for inpatient use) Yellow form placed in chart (order not valid for inpatient use) -    Tobacco Social History   Tobacco Use  Smoking Status Former Smoker   Packs/day: 2.00   Years: 35.00   Pack years: 70.00   Types: Cigarettes   Quit date: 01/24/2012   Years since quitting: 7.5  Smokeless Tobacco Never Used  Tobacco Comment   Remain smoke free     Counseling given: Not Answered Comment: Remain smoke free  Past Medical History:  Diagnosis Date   Anxiety    Arthritis    "back" (01/24/2014)   Asthma    Chronic bronchitis (HCC)    Cluster headache    "last bout was summer 2014; had them q spring 1991-2001" (01/24/2014)   COPD (chronic obstructive pulmonary disease) (HCC)    GERD (gastroesophageal reflux disease)    NSTEMI (non-ST elevated myocardial infarction) ([place]) 01/2014   On home oxygen therapy    "2L; 24/7" (01/24/2014)   Pneumonia 06/2013; 01/2014   Pulmonary embolism (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   Procedure: TOTAL HIP ARTHROPLASTY ANTERIOR APPROACH;  Surgeon: Tarry Kos, MD;  Location: [facility] OR;  Service: Orthopedics;  Laterality: Left;   TURBINATE REDUCTION Bilateral ~ 1983   Family History  Problem Relation Age of Onset   Leukemia Father    Heart disease Father    Asthma Father    Asthma Sister    Emphysema Sister    Heart disease Sister     High blood pressure Mother    Colon cancer Cousin        First cousin   Social History   Socioeconomic History   Marital status: Divorced    Spouse name: Not on file   Number of children: 0   Years of education: 14   Highest education level: Not on file  Occupational History   Occupation: Disability    Employer: [employer]  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   Food insecurity    Worry: Never true    Inability: Never true   Transportation needs    Medical: No    Non-medical: No  Tobacco Use   Smoking status: Former Smoker    Packs/day: 2.00    Years: 35.00    Pack years: 70.00    Types: Cigarettes    Quit date: 01/24/2012    Years since quitting: 7.5   Smokeless tobacco: Never Used   Tobacco comment: Remain smoke free  Substance and Sexual Activity   Alcohol use: Yes    Comment: 01/24/2014 "holidays; family birthdays"   Drug use: No   Sexual activity: Never  Lifestyle   Physical activity    Days per week: 0 days    Minutes per session: 0 min   Stress: Only a little  Relationships   Social connections    Talks on phone: More than three times a week    Gets together: More than three times a week    Attends religious service: Never    Active member of club or organization: No    Attends meetings of clubs or organizations: Never    Relationship status: Not on file  Other Topics Concern   Not on file  Social History Narrative   Born in [place], [place] and moved around a lot. Dad was an Art gallery manager and moved frequently.       Outpatient Encounter Medications as of 08/23/2019  Medication Sig   albuterol (PROVENTIL) (2.5 MG/3ML) 0.083% nebulizer solution Take 3 mLs (2.5 mg total) by nebulization every 6 (six) hours as needed for wheezing or shortness of breath.   budesonide-formoterol (SYMBICORT) 160-4.5 MCG/ACT inhaler INHALE 2 PUFFS FIRST THING IN THE MORNING AND ANOTHER 2 PUFFS ABOUT 12 HOURS LATER    fluticasone (FLONASE) 50 MCG/ACT nasal spray Place 1 spray into both nostrils daily.   guaiFENesin (MUCINEX) 600 MG 12 hr tablet Take 2 tablets (1,200 mg total) by mouth 2 (two) times daily.   montelukast (SINGULAIR) 10 MG tablet TAKE 1 TABLET(10 MG) BY MOUTH AT BEDTIME   Multiple Vitamin (MULTI VITAMIN PO) Take 1 tablet by mouth daily.   nicotine polacrilex (NICORETTE) 2 MG gum Take 2 mg by mouth as needed for smoking cessation.   OXYGEN Inhale 2 L/min into the lungs as needed. For sats less than 90%   predniSONE (DELTASONE) 10 MG tablet TAKE 1 TABLET BY MOUTH DAILY  PROAIR HFA 108 (90 Base) MCG/ACT inhaler INHALE 2 PUFFS BY MOUTH EVERY 6 HOURS AS NEEDED FOR WHEEZING OR SHORTNESS OF BREATH   rivaroxaban (XARELTO) 20 MG TABS tablet TAKE 1 TABLET(20 MG) BY MOUTH EVERY EVENING   sodium chloride (OCEAN) 0.65 % SOLN nasal spray Place 1 spray into both nostrils 4 (four) times daily as needed for congestion.   tiotropium (SPIRIVA HANDIHALER) 18 MCG inhalation capsule INHALE THE CONTENTS OF 1 CAPSULE VIA INHALATION DEVICE EVERY DAY   triamterene-hydrochlorothiazide (MAXZIDE-25) 37.5-25 MG tablet Take 1 tablet by mouth daily.   azithromycin (ZITHROMAX) 250 MG tablet Take 1 tab on Monday, Wednesday and Friday's (Patient not taking: Reported on 08/23/2019)   No facility-administered encounter medications on file as of 08/23/2019.     Activities of Daily Living In your present state of health, do you have any difficulty performing the following activities: 08/23/2019  Hearing? N  Vision? N  Difficulty concentrating or making decisions? N  Walking or climbing stairs? Y  Dressing or bathing? N  Doing errands, shopping? Y  Preparing Food and eating ? N  Using the Toilet? N  In the past six months, have you accidently leaked urine? N  Do you have problems with loss of bowel control? N  Managing your Medications? N  Managing your Finances? N  Housekeeping or managing your Housekeeping? Y    Some recent data might be hidden    Patient Care Team: [name], MD as PCP - General (Internal Medicine) Frederica Kuster, MD as Referring Physician (Family Medicine) Lupita Leash, MD as Consulting Physician (Pulmonary Disease)   Assessment:   This is a routine wellness examination for Fields. Physical assessment deferred to PCP.  Exercise Activities and Dietary recommendations Current Exercise Habits: The patient does not participate in regular exercise at present, Exercise limited by: respiratory conditions(s)  Diet (meal preparation, eat out, water intake, caffeinated beverages, dairy products, fruits and vegetables): in general, a "healthy" diet     Reviewed heart healthy diet. Encouraged patient to increase daily water and healthy fluid intake.  Goals     Patient Stated     Maintain current health status. Stay as healthy and as independent as possible.        Fall Risk Fall Risk  08/23/2019 04/12/2019 08/18/2018 01/25/2017 04/30/2016  Falls in the past year? 0 0 No No No  Number falls in past yr: 0 - - - -  Injury with Fall? 0 - - - -  Risk for fall due to : History of fall(s) - - - -  Follow up Falls prevention discussed - - - -   Is the patient's home free of loose throw rugs in walkways, pet beds, electrical cords, etc?   yes      Grab bars in the bathroom? no      Handrails on the stairs?   yes      Adequate lighting?   yes  Depression Screen PHQ 2/9 Scores 08/23/2019 04/12/2019 08/18/2018 01/25/2017  PHQ - 2 Score 3 0 3 5  PHQ- 9 Score 3 - 7 12    Cognitive Function       Ad8 score reviewed for issues:  Issues making decisions: no  Less interest in hobbies / activities: no  Repeats questions, stories (family complaining): no  Trouble using ordinary gadgets (microwave, computer, phone):no  Forgets the month or year: no  Mismanaging finances: no  Remembering appts: no  Daily problems with thinking and/or memory: no  Ad8 score is=  0  Immunization History  Administered Date(s) Administered   Fluad Quad(high Dose 65+) 07/05/2019   Influenza Split 08/10/2011, 07/27/2012   Influenza,inj,Quad PF,6+ Mos 07/14/2013, 08/06/2014, 10/17/2015, 07/21/2016, 08/26/2017, 07/28/2018   PPD Test 07/22/2017, 07/29/2017   Pneumococcal Conjugate-13 08/06/2014   Pneumococcal Polysaccharide-23 02/05/2012   Tdap 09/03/2014, 07/14/2017   Screening Tests Health Maintenance  Topic Date Due   URINE MICROALBUMIN  04/30/2017   HEMOGLOBIN A1C  02/17/2019   PNA vac Low Risk Adult (2 of 2 - PPSV23) 04/22/2019   FOOT EXAM  08/19/2019   OPHTHALMOLOGY EXAM  11/10/2019   COLONOSCOPY  10/20/2026   TETANUS/TDAP  07/15/2027   INFLUENZA VACCINE  Completed   Hepatitis C Screening  Completed   HIV Screening  Completed        Plan:    Reviewed health maintenance screenings with patient today and relevant education, vaccines, and/or referrals were provided.   I have personally reviewed and noted the following in the patients chart:    Medical and social history  Use of alcohol, tobacco or illicit drugs   Current medications and supplements  Functional ability and status  Nutritional status  Physical activity  Advanced directives  List of other physicians  Screenings to include cognitive, depression, and falls  Referrals and appointments  In addition, I have reviewed and discussed with patient certain preventive protocols, quality metrics, and best practice recommendations. A written personalized care plan for preventive services as well as general preventive health recommendations were provided to patient.     Wanda PlumpJill A Deseri Loss, RN  08/23/2019  Medical screening examination/treatment/procedure(s) were performed by non-physician practitioner and as supervising physician I was immediately available for consultation/collaboration. I agree with above. Oliver BarreJames [name], MD

## 2019-08-23 ENCOUNTER — Ambulatory Visit (INDEPENDENT_AMBULATORY_CARE_PROVIDER_SITE_OTHER): Payer: PPO | Admitting: *Deleted

## 2019-08-23 DIAGNOSIS — Z0000 Encounter for general adult medical examination without abnormal findings: Secondary | ICD-10-CM | POA: Diagnosis not present

## 2019-08-28 ENCOUNTER — Telehealth: Payer: Self-pay

## 2019-08-28 NOTE — Telephone Encounter (Signed)
Attempted to contact pt to see if call was from anothr providers office. I do not see where pt was contacted from [place] office.    Copied from [place] [id number]. Topic: Appointment Scheduling - Scheduling Inquiry for Clinic >> Aug 28, 2019  2:51 PM Theodore [name] wrote: Reason for CRM:   Pt states that he got an automated call that told him to call and schedule a kidney function test and A1C check.  Pt states that at [facility] nurse came to his home and did some tests, so he wants to make sure nothing is getting repeated.

## 2019-08-30 ENCOUNTER — Emergency Department (HOSPITAL_COMMUNITY): Payer: PPO

## 2019-08-30 ENCOUNTER — Inpatient Hospital Stay (HOSPITAL_COMMUNITY)
Admission: EM | Admit: 2019-08-30 | Discharge: 2019-09-18 | DRG: 166 | Disposition: A | Discharge status: Skilled Nursing Facility | Payer: PPO | Attending: Internal Medicine | Admitting: Internal Medicine

## 2019-08-30 ENCOUNTER — Encounter (HOSPITAL_COMMUNITY): Payer: Self-pay | Admitting: Family Medicine

## 2019-08-30 ENCOUNTER — Other Ambulatory Visit: Payer: Self-pay

## 2019-08-30 DIAGNOSIS — N3289 Other specified disorders of bladder: Secondary | ICD-10-CM | POA: Diagnosis not present

## 2019-08-30 DIAGNOSIS — I10 Essential (primary) hypertension: Secondary | ICD-10-CM | POA: Diagnosis not present

## 2019-08-30 DIAGNOSIS — Z791 Long term (current) use of non-steroidal anti-inflammatories (NSAID): Secondary | ICD-10-CM | POA: Diagnosis not present

## 2019-08-30 DIAGNOSIS — Z806 Family history of leukemia: Secondary | ICD-10-CM

## 2019-08-30 DIAGNOSIS — R601 Generalized edema: Secondary | ICD-10-CM | POA: Diagnosis not present

## 2019-08-30 DIAGNOSIS — K6389 Other specified diseases of intestine: Secondary | ICD-10-CM | POA: Diagnosis not present

## 2019-08-30 DIAGNOSIS — R34 Anuria and oliguria: Secondary | ICD-10-CM | POA: Diagnosis not present

## 2019-08-30 DIAGNOSIS — I480 Paroxysmal atrial fibrillation: Secondary | ICD-10-CM | POA: Diagnosis not present

## 2019-08-30 DIAGNOSIS — Z7901 Long term (current) use of anticoagulants: Secondary | ICD-10-CM | POA: Diagnosis not present

## 2019-08-30 DIAGNOSIS — R52 Pain, unspecified: Secondary | ICD-10-CM | POA: Diagnosis not present

## 2019-08-30 DIAGNOSIS — Z7952 Long term (current) use of systemic steroids: Secondary | ICD-10-CM | POA: Diagnosis not present

## 2019-08-30 DIAGNOSIS — K219 Gastro-esophageal reflux disease without esophagitis: Secondary | ICD-10-CM | POA: Diagnosis present

## 2019-08-30 DIAGNOSIS — J9601 Acute respiratory failure with hypoxia: Secondary | ICD-10-CM

## 2019-08-30 DIAGNOSIS — K567 Ileus, unspecified: Secondary | ICD-10-CM | POA: Diagnosis not present

## 2019-08-30 DIAGNOSIS — R933 Abnormal findings on diagnostic imaging of other parts of digestive tract: Secondary | ICD-10-CM | POA: Diagnosis present

## 2019-08-30 DIAGNOSIS — R0602 Shortness of breath: Secondary | ICD-10-CM | POA: Diagnosis not present

## 2019-08-30 DIAGNOSIS — R0600 Dyspnea, unspecified: Secondary | ICD-10-CM

## 2019-08-30 DIAGNOSIS — J9611 Chronic respiratory failure with hypoxia: Secondary | ICD-10-CM | POA: Diagnosis not present

## 2019-08-30 DIAGNOSIS — K668 Other specified disorders of peritoneum: Secondary | ICD-10-CM | POA: Diagnosis not present

## 2019-08-30 DIAGNOSIS — I2699 Other pulmonary embolism without acute cor pulmonale: Secondary | ICD-10-CM | POA: Diagnosis present

## 2019-08-30 DIAGNOSIS — R2681 Unsteadiness on feet: Secondary | ICD-10-CM | POA: Diagnosis not present

## 2019-08-30 DIAGNOSIS — Z96642 Presence of left artificial hip joint: Secondary | ICD-10-CM | POA: Diagnosis present

## 2019-08-30 DIAGNOSIS — I509 Heart failure, unspecified: Secondary | ICD-10-CM | POA: Diagnosis not present

## 2019-08-30 DIAGNOSIS — I110 Hypertensive heart disease with heart failure: Secondary | ICD-10-CM | POA: Diagnosis present

## 2019-08-30 DIAGNOSIS — J441 Chronic obstructive pulmonary disease with (acute) exacerbation: Secondary | ICD-10-CM | POA: Diagnosis present

## 2019-08-30 DIAGNOSIS — T83091A Other mechanical complication of indwelling urethral catheter, initial encounter: Secondary | ICD-10-CM | POA: Diagnosis not present

## 2019-08-30 DIAGNOSIS — L89152 Pressure ulcer of sacral region, stage 2: Secondary | ICD-10-CM | POA: Diagnosis present

## 2019-08-30 DIAGNOSIS — N179 Acute kidney failure, unspecified: Secondary | ICD-10-CM | POA: Diagnosis not present

## 2019-08-30 DIAGNOSIS — E876 Hypokalemia: Secondary | ICD-10-CM | POA: Diagnosis present

## 2019-08-30 DIAGNOSIS — I2781 Cor pulmonale (chronic): Secondary | ICD-10-CM | POA: Diagnosis present

## 2019-08-30 DIAGNOSIS — J449 Chronic obstructive pulmonary disease, unspecified: Secondary | ICD-10-CM

## 2019-08-30 DIAGNOSIS — K9189 Other postprocedural complications and disorders of digestive system: Secondary | ICD-10-CM | POA: Diagnosis not present

## 2019-08-30 DIAGNOSIS — R112 Nausea with vomiting, unspecified: Secondary | ICD-10-CM | POA: Diagnosis not present

## 2019-08-30 DIAGNOSIS — I252 Old myocardial infarction: Secondary | ICD-10-CM | POA: Diagnosis not present

## 2019-08-30 DIAGNOSIS — Z825 Family history of asthma and other chronic lower respiratory diseases: Secondary | ICD-10-CM

## 2019-08-30 DIAGNOSIS — R5381 Other malaise: Secondary | ICD-10-CM | POA: Diagnosis not present

## 2019-08-30 DIAGNOSIS — I959 Hypotension, unspecified: Secondary | ICD-10-CM | POA: Diagnosis not present

## 2019-08-30 DIAGNOSIS — I5033 Acute on chronic diastolic (congestive) heart failure: Secondary | ICD-10-CM | POA: Diagnosis not present

## 2019-08-30 DIAGNOSIS — L8990 Pressure ulcer of unspecified site, unspecified stage: Secondary | ICD-10-CM | POA: Insufficient documentation

## 2019-08-30 DIAGNOSIS — Z978 Presence of other specified devices: Secondary | ICD-10-CM

## 2019-08-30 DIAGNOSIS — M6281 Muscle weakness (generalized): Secondary | ICD-10-CM | POA: Diagnosis not present

## 2019-08-30 DIAGNOSIS — I2510 Atherosclerotic heart disease of native coronary artery without angina pectoris: Secondary | ICD-10-CM | POA: Diagnosis present

## 2019-08-30 DIAGNOSIS — D62 Acute posthemorrhagic anemia: Secondary | ICD-10-CM | POA: Diagnosis not present

## 2019-08-30 DIAGNOSIS — E871 Hypo-osmolality and hyponatremia: Secondary | ICD-10-CM | POA: Diagnosis present

## 2019-08-30 DIAGNOSIS — Z86711 Personal history of pulmonary embolism: Secondary | ICD-10-CM

## 2019-08-30 DIAGNOSIS — Z87891 Personal history of nicotine dependence: Secondary | ICD-10-CM

## 2019-08-30 DIAGNOSIS — Z20828 Contact with and (suspected) exposure to other viral communicable diseases: Secondary | ICD-10-CM | POA: Diagnosis present

## 2019-08-30 DIAGNOSIS — Z8249 Family history of ischemic heart disease and other diseases of the circulatory system: Secondary | ICD-10-CM

## 2019-08-30 DIAGNOSIS — R062 Wheezing: Secondary | ICD-10-CM | POA: Diagnosis not present

## 2019-08-30 DIAGNOSIS — J9621 Acute and chronic respiratory failure with hypoxia: Secondary | ICD-10-CM | POA: Diagnosis present

## 2019-08-30 DIAGNOSIS — R140 Abdominal distension (gaseous): Secondary | ICD-10-CM | POA: Diagnosis not present

## 2019-08-30 DIAGNOSIS — F29 Unspecified psychosis not due to a substance or known physiological condition: Secondary | ICD-10-CM | POA: Diagnosis not present

## 2019-08-30 DIAGNOSIS — Z79899 Other long term (current) drug therapy: Secondary | ICD-10-CM

## 2019-08-30 DIAGNOSIS — R000 Tachycardia, unspecified: Secondary | ICD-10-CM | POA: Diagnosis not present

## 2019-08-30 DIAGNOSIS — Z4682 Encounter for fitting and adjustment of non-vascular catheter: Secondary | ICD-10-CM | POA: Diagnosis not present

## 2019-08-30 DIAGNOSIS — R0609 Other forms of dyspnea: Secondary | ICD-10-CM | POA: Diagnosis present

## 2019-08-30 DIAGNOSIS — R262 Difficulty in walking, not elsewhere classified: Secondary | ICD-10-CM | POA: Diagnosis not present

## 2019-08-30 DIAGNOSIS — R41841 Cognitive communication deficit: Secondary | ICD-10-CM | POA: Diagnosis not present

## 2019-08-30 DIAGNOSIS — Z0189 Encounter for other specified special examinations: Secondary | ICD-10-CM

## 2019-08-30 DIAGNOSIS — Z800 Family history of malignant neoplasm of digestive organs: Secondary | ICD-10-CM

## 2019-08-30 DIAGNOSIS — Y738 Miscellaneous gastroenterology and urology devices associated with adverse incidents, not elsewhere classified: Secondary | ICD-10-CM | POA: Diagnosis not present

## 2019-08-30 DIAGNOSIS — Z931 Gastrostomy status: Secondary | ICD-10-CM | POA: Diagnosis not present

## 2019-08-30 DIAGNOSIS — J80 Acute respiratory distress syndrome: Secondary | ICD-10-CM | POA: Diagnosis not present

## 2019-08-30 DIAGNOSIS — Z9981 Dependence on supplemental oxygen: Secondary | ICD-10-CM | POA: Diagnosis not present

## 2019-08-30 DIAGNOSIS — M2550 Pain in unspecified joint: Secondary | ICD-10-CM | POA: Diagnosis not present

## 2019-08-30 DIAGNOSIS — Z7401 Bed confinement status: Secondary | ICD-10-CM | POA: Diagnosis not present

## 2019-08-30 DIAGNOSIS — J9610 Chronic respiratory failure, unspecified whether with hypoxia or hypercapnia: Secondary | ICD-10-CM | POA: Diagnosis present

## 2019-08-30 DIAGNOSIS — Z7951 Long term (current) use of inhaled steroids: Secondary | ICD-10-CM

## 2019-08-30 DIAGNOSIS — R05 Cough: Secondary | ICD-10-CM | POA: Diagnosis not present

## 2019-08-30 DIAGNOSIS — I2609 Other pulmonary embolism with acute cor pulmonale: Secondary | ICD-10-CM | POA: Diagnosis not present

## 2019-08-30 DIAGNOSIS — J439 Emphysema, unspecified: Principal | ICD-10-CM | POA: Diagnosis present

## 2019-08-30 MED ORDER — ALBUTEROL SULFATE HFA 108 (90 BASE) MCG/ACT IN AERS
10.0000 | INHALATION_SPRAY | Freq: Once | RESPIRATORY_TRACT | Status: AC
  Administered 2019-08-30: 10 via RESPIRATORY_TRACT
  Filled 2019-08-30: qty 6.7

## 2019-08-30 MED ORDER — IPRATROPIUM BROMIDE HFA 17 MCG/ACT IN AERS
4.0000 | INHALATION_SPRAY | RESPIRATORY_TRACT | Status: DC
  Administered 2019-08-31: 4 via RESPIRATORY_TRACT
  Filled 2019-08-30: qty 12.9

## 2019-08-30 NOTE — ED Triage Notes (Signed)
Patient is from home and transported via [facility]. According to EMS, he was assisting someone else to bed when he became short of breath. EMS claims he has wheezing and diminished. He has had one Albuterol nebulizer at home prior to EMS arrival. EMS obtained an IV and administered Solu-Medrol 125 mg and gave an albuterol nebulizer treatment.

## 2019-08-30 NOTE — ED Provider Notes (Addendum)
[facility]-EMERGENCY DEPT Provider Note   CSN: [id number] Arrival date & time: 08/30/19  2315     History   Chief Complaint Chief Complaint  Patient presents with  . Shortness of Breath    HPI ROBERT SPERL is a 63 y.o. male.     The history is provided by the EMS personnel. The history is limited by the condition of the patient.  Shortness of Breath Severity:  Severe Onset quality:  Gradual Duration:  1 day Timing:  Constant Progression:  Worsening Chronicity:  Recurrent Context: activity   Relieved by:  Nothing Worsened by:  Exertion Ineffective treatments:  Diuretics Associated symptoms: cough   Associated symptoms: no chest pain, no fever, no hemoptysis and no syncope   Risk factors: no recent alcohol use     Past Medical History:  Diagnosis Date  . Anxiety   . Arthritis    "back" (01/24/2014)  . Asthma   . Chronic bronchitis (HCC)   . Cluster headache    "last bout was summer 2014; had them q spring 1991-2001" (01/24/2014)  . COPD (chronic obstructive pulmonary disease) (HCC)   . GERD (gastroesophageal reflux disease)   . NSTEMI (non-ST elevated myocardial infarction) (HCC) 01/2014  . On home oxygen therapy    "2L; 24/7" (01/24/2014)  . Pneumonia 06/2013; 01/2014  . Pulmonary embolism (HCC) 01/23/2014  . Seasonal allergies     Patient Active Problem List   Diagnosis Date Noted  . Depression 08/22/2018  . Pre-diabetes 08/22/2018  . Trochanteric bursitis, left hip 11/12/2017  . Allergic rhinitis 10/11/2017  . Constipation due to opioid therapy 08/06/2017  . Recurrent pulmonary emboli (HCC) 07/26/2017  . History of MI (myocardial infarction) 07/26/2017  . History of total hip replacement, left 07/26/2017  . Chronic respiratory failure with hypoxia (HCC) 07/16/2017  . Left displaced femoral neck fracture (HCC) 07/14/2017  . Closed displaced fracture of left femoral neck (HCC) 07/14/2017  . Hypokalemia   . Cardiac arrhythmia  07/06/2017  . Costochondral chest pain 12/28/2016  . Bilateral lower extremity edema 12/28/2016  . Chronic anticoagulation 12/23/2016  . DOE (dyspnea on exertion) 12/03/2016  . Chest pain 12/03/2016  . Anxiety state 10/30/2015  . Preventative health care 09/03/2014  . Back pain 09/03/2014  . HBP (high blood pressure) 01/29/2014  . CAD (coronary artery disease) 01/23/2014  . Chronic respiratory failure assoc with cor pulmonale 01/20/2014  . Acute cor pulmonale (HCC) 01/12/2014  . Multiple lung nodules 10/24/2013  . Smoking 03/03/2012  . COPD GOLD IV  09/28/2011    Past Surgical History:  Procedure Laterality Date  . COLONOSCOPY WITH PROPOFOL N/A 10/20/2016   Procedure: COLONOSCOPY WITH PROPOFOL;  Surgeon: Sherrilyn Rist, MD;  Location: [facility] ENDOSCOPY;  Service: Gastroenterology;  Laterality: N/A;  . NASAL SEPTOPLASTY W/ TURBINOPLASTY Left 1983  . TOTAL HIP ARTHROPLASTY Left 07/16/2017   Procedure: TOTAL HIP ARTHROPLASTY ANTERIOR APPROACH;  Surgeon: Tarry Kos, MD;  Location: [facility] OR;  Service: Orthopedics;  Laterality: Left;  . TURBINATE REDUCTION Bilateral ~ 1983        Home Medications    Prior to Admission medications   Medication Sig Start Date End Date Taking? Authorizing Provider  albuterol (PROVENTIL) (2.5 MG/3ML) 0.083% nebulizer solution Take 3 mLs (2.5 mg total) by nebulization every 6 (six) hours as needed for wheezing or shortness of breath. 12/02/18   Lupita Leash, MD  azithromycin (ZITHROMAX) 250 MG tablet Take 1 tab on Monday, Wednesday and Friday's Patient  not taking: Reported on 08/23/2019 07/05/19   Glenford BayleyWalsh, Elizabeth W, NP  budesonide-formoterol (SYMBICORT) 160-4.5 MCG/ACT inhaler INHALE 2 PUFFS FIRST THING IN THE MORNING AND ANOTHER 2 PUFFS ABOUT 12 HOURS LATER 04/12/19   [name], Daeshaun W, MD  fluticasone [facility](FLONASE) 50 MCG/ACT nasal spray Place 1 spray into both nostrils daily.    [provider]  guaiFENesin (MUCINEX) 600 MG 12 hr tablet Take 2 tablets  (1,200 mg total) by mouth 2 (two) times daily. 07/05/19   Glenford BayleyWalsh, Elizabeth W, NP  montelukast (SINGULAIR) 10 MG tablet TAKE 1 TABLET(10 MG) BY MOUTH AT BEDTIME 06/26/19   Glenford BayleyWalsh, Elizabeth W, NP  Multiple Vitamin (MULTI VITAMIN PO) Take 1 tablet by mouth daily.    [provider]  nicotine polacrilex (NICORETTE) 2 MG gum Take 2 mg by mouth as needed for smoking cessation.    [provider]  OXYGEN Inhale 2 L/min into the lungs as needed. For sats less than 90%    [provider]  predniSONE (DELTASONE) 10 MG tablet TAKE 1 TABLET BY MOUTH DAILY 08/02/19   Lupita LeashMcQuaid, Douglas B, MD  PROAIR HFA 108 [phone](90 Base) MCG/ACT inhaler INHALE 2 PUFFS BY MOUTH EVERY 6 HOURS AS NEEDED FOR WHEEZING OR SHORTNESS OF BREATH 12/02/18   Lupita LeashMcQuaid, Douglas B, MD  rivaroxaban (XARELTO) 20 MG TABS tablet TAKE 1 TABLET(20 MG) BY MOUTH EVERY EVENING 04/12/19   [name], Jefrey W, MD  sodium chloride (OCEAN) 0.65 % SOLN nasal spray Place 1 spray into both nostrils 4 (four) times daily as needed for congestion.    [provider]  tiotropium (SPIRIVA HANDIHALER) 18 MCG inhalation capsule INHALE THE CONTENTS OF 1 CAPSULE VIA INHALATION DEVICE EVERY DAY 05/29/19   Lupita LeashMcQuaid, Douglas B, MD  triamterene-hydrochlorothiazide (MAXZIDE-25) 37.5-25 MG tablet Take 1 tablet by mouth daily. 04/12/19   [name], Euriah W, MD    Family History Family History  Problem Relation Age of Onset  . Leukemia Father   . Heart disease Father   . Asthma Father   . Asthma Sister   . Emphysema Sister   . Heart disease Sister   . High blood pressure Mother   . Colon cancer Cousin        First cousin    Social History Social History   Tobacco Use  . Smoking status: Former Smoker    Packs/day: 2.00    Years: 35.00    Pack years: 70.00    Types: Cigarettes    Quit date: 01/24/2012    Years since quitting: 7.6  . Smokeless tobacco: Never Used  . Tobacco comment: Remain smoke free  Substance Use Topics  . Alcohol use: Not  Currently  . Drug use: No     Allergies   Naproxen sodium   Review of Systems Review of Systems  Unable to perform ROS: Acuity of condition  Constitutional: Negative for fever.  Respiratory: Positive for cough and shortness of breath. Negative for hemoptysis.   Cardiovascular: Positive for leg swelling. Negative for chest pain and syncope.     Physical Exam Updated Vital Signs Ht 6\' 4"  (1.93 m)   Wt 104.3 kg   SpO2 98% Comment: Non-Rebreather  BMI 28.00 kg/m   Physical Exam Vitals signs and nursing note reviewed.  Constitutional:      Appearance: He is not diaphoretic.  HENT:     Head: Normocephalic and atraumatic.     Nose: Nose normal.  Eyes:     Conjunctiva/sclera: Conjunctivae normal.  Pupils: Pupils are equal, round, and reactive to light.  Neck:     Musculoskeletal: Normal range of motion and neck supple.  Cardiovascular:     Rate and Rhythm: Regular rhythm. Tachycardia present.     Pulses: Normal pulses.     Heart sounds: Normal heart sounds.  Pulmonary:     Effort: Tachypnea present.     Breath sounds: Decreased air movement present. Decreased breath sounds and wheezing present.  Abdominal:     General: Abdomen is flat. Bowel sounds are normal.     Tenderness: There is no abdominal tenderness. There is no guarding.  Musculoskeletal:     Right lower leg: Edema present.     Left lower leg: Edema present.  Skin:    General: Skin is warm and dry.     Capillary Refill: Capillary refill takes less than 2 seconds.  Neurological:     General: No focal deficit present.     Mental Status: He is alert.  Psychiatric:        Mood and Affect: Mood normal.      ED Treatments / Results  Labs (all labs ordered are listed, but only abnormal results are displayed) Results for orders placed or performed during the hospital encounter of 08/30/19  SARS Coronavirus 2 by RT PCR (hospital order, performed in [place] hospital lab) Nasopharyngeal Nasopharyngeal  Swab   Specimen: Nasopharyngeal Swab  Result Value Ref Range   SARS Coronavirus 2 NEGATIVE NEGATIVE  CBC with Differential/Platelet  Result Value Ref Range   WBC 12.3 (H) 4.0 - 10.5 K/uL   RBC 4.57 4.22 - 5.81 MIL/uL   Hemoglobin 14.2 13.0 - 17.0 g/dL   HCT 43.6 39.0 - 52.0 %   MCV 95.4 80.0 - 100.0 fL   MCH 31.1 26.0 - 34.0 pg   MCHC 32.6 30.0 - 36.0 g/dL   RDW 13.6 11.5 - 15.5 %   Platelets 408 (H) 150 - 400 K/uL   nRBC 0.0 0.0 - 0.2 %   Neutrophils Relative % 75 %   Neutro Abs 9.1 (H) 1.7 - 7.7 K/uL   Lymphocytes Relative 13 %   Lymphs Abs 1.7 0.7 - 4.0 K/uL   Monocytes Relative 8 %   Monocytes Absolute 1.0 0.1 - 1.0 K/uL   Eosinophils Relative 1 %   Eosinophils Absolute 0.2 0.0 - 0.5 K/uL   Basophils Relative 1 %   Basophils Absolute 0.1 0.0 - 0.1 K/uL   Immature Granulocytes 2 %   Abs Immature Granulocytes 0.30 (H) 0.00 - 0.07 K/uL  Brain natriuretic peptide  Result Value Ref Range   B Natriuretic Peptide 44.0 0.0 - 100.0 pg/mL  I-stat chem 8, ED (not at [facility] or [facility])  Result Value Ref Range   Sodium 133 (L) 135 - 145 mmol/L   Potassium 3.7 3.5 - 5.1 mmol/L   Chloride 98 98 - 111 mmol/L   BUN 16 8 - 23 mg/dL   Creatinine, Ser 0.90 0.61 - 1.24 mg/dL   Glucose, Bld 113 (H) 70 - 99 mg/dL   Calcium, Ion 1.15 1.15 - 1.40 mmol/L   TCO2 25 22 - 32 mmol/L   Hemoglobin 14.3 13.0 - 17.0 g/dL   HCT 42.0 39.0 - 52.0 %  Troponin I (High Sensitivity)  Result Value Ref Range   Troponin I (High Sensitivity) 6 <18 ng/L   Dg Chest Portable 1 View  Result Date: 08/31/2019 CLINICAL DATA:  Shortness of breath. Cough. Wheezing. COPD. EXAM: PORTABLE CHEST 1  VIEW COMPARISON:  Radiograph 07/17/2017, CT 12/03/2016 FINDINGS: Chronic hyperinflation and emphysema. Unchanged heart size and mediastinal contours. No acute airspace disease, pulmonary edema, large pleural effusion or pneumothorax. There is biapical pleuroparenchymal scarring. No acute osseous abnormalities. IMPRESSION: Chronic  hyperinflation and emphysema. No superimposed acute abnormality. Electronically Signed   By: Narda Rutherford M.D.   On: 08/31/2019 00:15    EKG  EKG Interpretation  Date/Time:  Wednesday August 30 2019 23:47:13 EDT Ventricular Rate:  112 PR Interval:    QRS Duration: 104 QT Interval:  323 QTC Calculation: 441 R Axis:   99 Text Interpretation:  Sinus tachycardia  Anteroseptal infarct, age indeterminate Confirmed by Arcangel Minion ([id number]) on 08/30/2019 11:49:55 PM       Radiology No results found.  Procedures Procedures (including critical care time)  Medications Ordered in ED Medications  ipratropium (ATROVENT HFA) inhaler 4 puff (4 puffs Inhalation Given 08/31/19 0016)  magnesium sulfate IVPB 2 g 50 mL (has no administration in time range)  cefTRIAXone (ROCEPHIN) 1 g in sodium chloride 0.9 % 100 mL IVPB (has no administration in time range)  albuterol (PROVENTIL) (2.5 MG/3ML) 0.083% nebulizer solution 5 mg (has no administration in time range)  albuterol (VENTOLIN HFA) 108 (90 Base) MCG/ACT inhaler 10 puff (10 puffs Inhalation Given 08/30/19 2357)     Final Clinical Impressions(s) / ED Diagnoses   Admit for COPD exacerbation and anasarca   Irish Piech, MD 08/31/19 Leida Lauth, Sederick Jacobsen, MD 08/31/19 0134

## 2019-08-31 ENCOUNTER — Encounter (HOSPITAL_COMMUNITY): Payer: Self-pay | Admitting: Emergency Medicine

## 2019-08-31 ENCOUNTER — Inpatient Hospital Stay (HOSPITAL_COMMUNITY): Payer: PPO

## 2019-08-31 DIAGNOSIS — K219 Gastro-esophageal reflux disease without esophagitis: Secondary | ICD-10-CM | POA: Diagnosis present

## 2019-08-31 DIAGNOSIS — J9621 Acute and chronic respiratory failure with hypoxia: Secondary | ICD-10-CM | POA: Diagnosis present

## 2019-08-31 DIAGNOSIS — I509 Heart failure, unspecified: Secondary | ICD-10-CM | POA: Diagnosis not present

## 2019-08-31 DIAGNOSIS — N3289 Other specified disorders of bladder: Secondary | ICD-10-CM | POA: Diagnosis not present

## 2019-08-31 DIAGNOSIS — I2609 Other pulmonary embolism with acute cor pulmonale: Secondary | ICD-10-CM | POA: Diagnosis not present

## 2019-08-31 DIAGNOSIS — J439 Emphysema, unspecified: Secondary | ICD-10-CM | POA: Diagnosis present

## 2019-08-31 DIAGNOSIS — J441 Chronic obstructive pulmonary disease with (acute) exacerbation: Secondary | ICD-10-CM | POA: Diagnosis present

## 2019-08-31 DIAGNOSIS — J449 Chronic obstructive pulmonary disease, unspecified: Secondary | ICD-10-CM | POA: Diagnosis not present

## 2019-08-31 DIAGNOSIS — E871 Hypo-osmolality and hyponatremia: Secondary | ICD-10-CM | POA: Diagnosis present

## 2019-08-31 DIAGNOSIS — J9610 Chronic respiratory failure, unspecified whether with hypoxia or hypercapnia: Secondary | ICD-10-CM | POA: Diagnosis not present

## 2019-08-31 DIAGNOSIS — K567 Ileus, unspecified: Secondary | ICD-10-CM | POA: Diagnosis not present

## 2019-08-31 DIAGNOSIS — J9601 Acute respiratory failure with hypoxia: Secondary | ICD-10-CM | POA: Diagnosis not present

## 2019-08-31 DIAGNOSIS — Z20828 Contact with and (suspected) exposure to other viral communicable diseases: Secondary | ICD-10-CM | POA: Diagnosis present

## 2019-08-31 DIAGNOSIS — I2781 Cor pulmonale (chronic): Secondary | ICD-10-CM | POA: Diagnosis present

## 2019-08-31 DIAGNOSIS — Z9981 Dependence on supplemental oxygen: Secondary | ICD-10-CM | POA: Diagnosis not present

## 2019-08-31 DIAGNOSIS — Y738 Miscellaneous gastroenterology and urology devices associated with adverse incidents, not elsewhere classified: Secondary | ICD-10-CM | POA: Diagnosis not present

## 2019-08-31 DIAGNOSIS — D62 Acute posthemorrhagic anemia: Secondary | ICD-10-CM | POA: Diagnosis not present

## 2019-08-31 DIAGNOSIS — I2510 Atherosclerotic heart disease of native coronary artery without angina pectoris: Secondary | ICD-10-CM | POA: Diagnosis present

## 2019-08-31 DIAGNOSIS — I480 Paroxysmal atrial fibrillation: Secondary | ICD-10-CM | POA: Diagnosis not present

## 2019-08-31 DIAGNOSIS — I2699 Other pulmonary embolism without acute cor pulmonale: Secondary | ICD-10-CM | POA: Diagnosis not present

## 2019-08-31 DIAGNOSIS — I5033 Acute on chronic diastolic (congestive) heart failure: Secondary | ICD-10-CM | POA: Diagnosis not present

## 2019-08-31 DIAGNOSIS — L89152 Pressure ulcer of sacral region, stage 2: Secondary | ICD-10-CM | POA: Diagnosis present

## 2019-08-31 DIAGNOSIS — K668 Other specified disorders of peritoneum: Secondary | ICD-10-CM | POA: Diagnosis not present

## 2019-08-31 DIAGNOSIS — R140 Abdominal distension (gaseous): Secondary | ICD-10-CM | POA: Diagnosis not present

## 2019-08-31 DIAGNOSIS — T83091A Other mechanical complication of indwelling urethral catheter, initial encounter: Secondary | ICD-10-CM | POA: Diagnosis not present

## 2019-08-31 DIAGNOSIS — N179 Acute kidney failure, unspecified: Secondary | ICD-10-CM | POA: Diagnosis not present

## 2019-08-31 DIAGNOSIS — J9611 Chronic respiratory failure with hypoxia: Secondary | ICD-10-CM | POA: Diagnosis not present

## 2019-08-31 DIAGNOSIS — Z86711 Personal history of pulmonary embolism: Secondary | ICD-10-CM | POA: Diagnosis not present

## 2019-08-31 DIAGNOSIS — E876 Hypokalemia: Secondary | ICD-10-CM | POA: Diagnosis present

## 2019-08-31 DIAGNOSIS — I959 Hypotension, unspecified: Secondary | ICD-10-CM | POA: Diagnosis not present

## 2019-08-31 DIAGNOSIS — K9189 Other postprocedural complications and disorders of digestive system: Secondary | ICD-10-CM | POA: Diagnosis not present

## 2019-08-31 DIAGNOSIS — Z7901 Long term (current) use of anticoagulants: Secondary | ICD-10-CM | POA: Diagnosis not present

## 2019-08-31 DIAGNOSIS — R0609 Other forms of dyspnea: Secondary | ICD-10-CM | POA: Diagnosis not present

## 2019-08-31 DIAGNOSIS — Z7952 Long term (current) use of systemic steroids: Secondary | ICD-10-CM | POA: Diagnosis not present

## 2019-08-31 DIAGNOSIS — Z791 Long term (current) use of non-steroidal anti-inflammatories (NSAID): Secondary | ICD-10-CM | POA: Diagnosis not present

## 2019-08-31 DIAGNOSIS — R34 Anuria and oliguria: Secondary | ICD-10-CM | POA: Diagnosis not present

## 2019-08-31 DIAGNOSIS — R933 Abnormal findings on diagnostic imaging of other parts of digestive tract: Secondary | ICD-10-CM | POA: Diagnosis present

## 2019-08-31 DIAGNOSIS — I110 Hypertensive heart disease with heart failure: Secondary | ICD-10-CM | POA: Diagnosis present

## 2019-08-31 LAB — CBC WITH DIFFERENTIAL/PLATELET
Abs Immature Granulocytes: 0.3 10*3/uL — ABNORMAL HIGH (ref 0.00–0.07)
Abs Immature Granulocytes: 0.34 10*3/uL — ABNORMAL HIGH (ref 0.00–0.07)
Basophils Absolute: 0.1 10*3/uL (ref 0.0–0.1)
Basophils Absolute: 0.1 10*3/uL (ref 0.0–0.1)
Basophils Relative: 1 %
Basophils Relative: 1 %
Eosinophils Absolute: 0 10*3/uL (ref 0.0–0.5)
Eosinophils Absolute: 0.2 10*3/uL (ref 0.0–0.5)
Eosinophils Relative: 0 %
Eosinophils Relative: 1 %
HCT: 43.6 % (ref 39.0–52.0)
HCT: 44.1 % (ref 39.0–52.0)
Hemoglobin: 14.1 g/dL (ref 13.0–17.0)
Hemoglobin: 14.2 g/dL (ref 13.0–17.0)
Immature Granulocytes: 2 %
Immature Granulocytes: 2 %
Lymphocytes Relative: 13 %
Lymphocytes Relative: 3 %
Lymphs Abs: 0.4 10*3/uL — ABNORMAL LOW (ref 0.7–4.0)
Lymphs Abs: 1.7 10*3/uL (ref 0.7–4.0)
MCH: 30.9 pg (ref 26.0–34.0)
MCH: 31.1 pg (ref 26.0–34.0)
MCHC: 32 g/dL (ref 30.0–36.0)
MCHC: 32.6 g/dL (ref 30.0–36.0)
MCV: 95.4 fL (ref 80.0–100.0)
MCV: 96.7 fL (ref 80.0–100.0)
Monocytes Absolute: 0.1 10*3/uL (ref 0.1–1.0)
Monocytes Absolute: 1 10*3/uL (ref 0.1–1.0)
Monocytes Relative: 1 %
Monocytes Relative: 8 %
Neutro Abs: 13.9 10*3/uL — ABNORMAL HIGH (ref 1.7–7.7)
Neutro Abs: 9.1 10*3/uL — ABNORMAL HIGH (ref 1.7–7.7)
Neutrophils Relative %: 75 %
Neutrophils Relative %: 93 %
Platelets: 351 10*3/uL (ref 150–400)
Platelets: 408 10*3/uL — ABNORMAL HIGH (ref 150–400)
RBC: 4.56 MIL/uL (ref 4.22–5.81)
RBC: 4.57 MIL/uL (ref 4.22–5.81)
RDW: 13.6 % (ref 11.5–15.5)
RDW: 13.7 % (ref 11.5–15.5)
WBC: 12.3 10*3/uL — ABNORMAL HIGH (ref 4.0–10.5)
WBC: 14.8 10*3/uL — ABNORMAL HIGH (ref 4.0–10.5)
nRBC: 0 % (ref 0.0–0.2)
nRBC: 0 % (ref 0.0–0.2)

## 2019-08-31 LAB — COMPREHENSIVE METABOLIC PANEL
ALT: 17 U/L (ref 0–44)
AST: 19 U/L (ref 15–41)
Albumin: 3.7 g/dL (ref 3.5–5.0)
Alkaline Phosphatase: 54 U/L (ref 38–126)
Anion gap: 13 (ref 5–15)
BUN: 17 mg/dL (ref 8–23)
CO2: 23 mmol/L (ref 22–32)
Calcium: 9 mg/dL (ref 8.9–10.3)
Chloride: 96 mmol/L — ABNORMAL LOW (ref 98–111)
Creatinine, Ser: 0.97 mg/dL (ref 0.61–1.24)
GFR calc Af Amer: >60 mL/min (ref >60)
GFR calc non Af Amer: >60 mL/min (ref >60)
Glucose, Bld: 165 mg/dL — ABNORMAL HIGH (ref 70–99)
Potassium: 4.2 mmol/L (ref 3.5–5.1)
Sodium: 132 mmol/L — ABNORMAL LOW (ref 135–145)
Total Bilirubin: 0.6 mg/dL (ref 0.3–1.2)
Total Protein: 6.7 g/dL (ref 6.5–8.1)

## 2019-08-31 LAB — BRAIN NATRIURETIC PEPTIDE: B Natriuretic Peptide: 44 pg/mL (ref 0.0–100.0)

## 2019-08-31 LAB — I-STAT CHEM 8, ED
BUN: 16 mg/dL (ref 8–23)
Calcium, Ion: 1.15 mmol/L (ref 1.15–1.40)
Chloride: 98 mmol/L (ref 98–111)
Creatinine, Ser: 0.9 mg/dL (ref 0.61–1.24)
Glucose, Bld: 113 mg/dL — ABNORMAL HIGH (ref 70–99)
HCT: 42 % (ref 39.0–52.0)
Hemoglobin: 14.3 g/dL (ref 13.0–17.0)
Potassium: 3.7 mmol/L (ref 3.5–5.1)
Sodium: 133 mmol/L — ABNORMAL LOW (ref 135–145)
TCO2: 25 mmol/L (ref 22–32)

## 2019-08-31 LAB — SARS CORONAVIRUS 2 BY RT PCR (HOSPITAL ORDER, PERFORMED IN ~~LOC~~ HOSPITAL LAB): SARS Coronavirus 2: NEGATIVE

## 2019-08-31 LAB — GLUCOSE, CAPILLARY: Glucose-Capillary: 168 mg/dL — ABNORMAL HIGH (ref 70–99)

## 2019-08-31 LAB — TROPONIN I (HIGH SENSITIVITY): Troponin I (High Sensitivity): 6 ng/L (ref <18)

## 2019-08-31 LAB — ECHOCARDIOGRAM COMPLETE
Height: 76 in
Weight: 3680 oz

## 2019-08-31 LAB — HIV ANTIBODY (ROUTINE TESTING W REFLEX): HIV Screen 4th Generation wRfx: NONREACTIVE

## 2019-08-31 MED ORDER — MONTELUKAST SODIUM 10 MG PO TABS
10.0000 mg | ORAL_TABLET | Freq: Every day | ORAL | Status: DC
  Administered 2019-08-31 – 2019-09-03 (×4): 10 mg via ORAL
  Filled 2019-08-31 (×5): qty 1

## 2019-08-31 MED ORDER — SODIUM CHLORIDE 0.9% FLUSH: 3.0000 mL | INTRAVENOUS | Status: DC | PRN

## 2019-08-31 MED ORDER — SODIUM CHLORIDE 0.9% FLUSH
3.0000 mL | Freq: Two times a day (BID) | INTRAVENOUS | Status: DC
  Administered 2019-08-31 – 2019-09-13 (×20): 3 mL via INTRAVENOUS

## 2019-08-31 MED ORDER — NICOTINE POLACRILEX 2 MG MT GUM
2.0000 mg | CHEWING_GUM | OROMUCOSAL | Status: DC | PRN
  Filled 2019-08-31: qty 1

## 2019-08-31 MED ORDER — IPRATROPIUM BROMIDE HFA 17 MCG/ACT IN AERS
2.0000 | INHALATION_SPRAY | Freq: Four times a day (QID) | RESPIRATORY_TRACT | Status: DC | PRN
  Filled 2019-08-31: qty 12.9

## 2019-08-31 MED ORDER — SODIUM CHLORIDE 0.9 % IV SOLN
500.0000 mg | Freq: Every day | INTRAVENOUS | Status: DC
  Administered 2019-08-31 – 2019-09-01 (×3): 500 mg via INTRAVENOUS
  Filled 2019-08-31 (×3): qty 500

## 2019-08-31 MED ORDER — TRIAMTERENE-HCTZ 37.5-25 MG PO TABS
1.0000 | ORAL_TABLET | Freq: Every day | ORAL | Status: DC
  Filled 2019-08-31: qty 1

## 2019-08-31 MED ORDER — SODIUM CHLORIDE 0.9 % IV SOLN
250.0000 mL | INTRAVENOUS | Status: DC | PRN
  Administered 2019-09-04 – 2019-09-05 (×3): 250 mL via INTRAVENOUS

## 2019-08-31 MED ORDER — SODIUM CHLORIDE 0.9 % IV SOLN
INTRAVENOUS | Status: DC | PRN
  Administered 2019-08-31: 02:00:00 via INTRAVENOUS
  Administered 2019-09-04: 250 mL via INTRAVENOUS

## 2019-08-31 MED ORDER — SALINE SPRAY 0.65 % NA SOLN
1.0000 | NASAL | Status: DC | PRN
  Administered 2019-09-10: 1 via NASAL
  Filled 2019-08-31: qty 44

## 2019-08-31 MED ORDER — ACETAMINOPHEN 650 MG RE SUPP: 650.0000 mg | Freq: Four times a day (QID) | RECTAL | Status: DC | PRN

## 2019-08-31 MED ORDER — ALBUTEROL SULFATE HFA 108 (90 BASE) MCG/ACT IN AERS
2.0000 | INHALATION_SPRAY | Freq: Four times a day (QID) | RESPIRATORY_TRACT | Status: DC | PRN
  Administered 2019-09-02 – 2019-09-03 (×2): 2 via RESPIRATORY_TRACT
  Filled 2019-08-31: qty 6.7

## 2019-08-31 MED ORDER — RIVAROXABAN 20 MG PO TABS
20.0000 mg | ORAL_TABLET | Freq: Every day | ORAL | Status: DC
  Administered 2019-08-31 – 2019-09-04 (×5): 20 mg via ORAL
  Filled 2019-08-31 (×5): qty 1

## 2019-08-31 MED ORDER — ONDANSETRON HCL 4 MG PO TABS: 4.0000 mg | ORAL_TABLET | Freq: Four times a day (QID) | ORAL | Status: DC | PRN

## 2019-08-31 MED ORDER — IPRATROPIUM-ALBUTEROL 0.5-2.5 (3) MG/3ML IN SOLN
3.0000 mL | Freq: Four times a day (QID) | RESPIRATORY_TRACT | Status: DC
  Administered 2019-08-31 (×3): 3 mL via RESPIRATORY_TRACT
  Filled 2019-08-31 (×3): qty 3

## 2019-08-31 MED ORDER — METHYLPREDNISOLONE SODIUM SUCC 125 MG IJ SOLR
60.0000 mg | Freq: Two times a day (BID) | INTRAMUSCULAR | Status: DC
  Administered 2019-08-31 – 2019-09-01 (×4): 60 mg via INTRAVENOUS
  Filled 2019-08-31 (×4): qty 2

## 2019-08-31 MED ORDER — ALBUTEROL SULFATE (2.5 MG/3ML) 0.083% IN NEBU
5.0000 mg | INHALATION_SOLUTION | Freq: Once | RESPIRATORY_TRACT | Status: AC
  Administered 2019-08-31: 5 mg via RESPIRATORY_TRACT
  Filled 2019-08-31: qty 6

## 2019-08-31 MED ORDER — IPRATROPIUM-ALBUTEROL 0.5-2.5 (3) MG/3ML IN SOLN
3.0000 mL | Freq: Four times a day (QID) | RESPIRATORY_TRACT | Status: DC
  Administered 2019-09-01 (×3): 3 mL via RESPIRATORY_TRACT
  Filled 2019-08-31 (×3): qty 3

## 2019-08-31 MED ORDER — ACETAMINOPHEN 325 MG PO TABS
650.0000 mg | ORAL_TABLET | Freq: Four times a day (QID) | ORAL | Status: DC | PRN
  Administered 2019-09-12 – 2019-09-15 (×4): 650 mg via ORAL
  Filled 2019-08-31 (×4): qty 2

## 2019-08-31 MED ORDER — RIVAROXABAN 15 MG PO TABS: 15.0000 mg | ORAL_TABLET | Freq: Every day | ORAL | Status: DC

## 2019-08-31 MED ORDER — ONDANSETRON HCL 4 MG/2ML IJ SOLN
4.0000 mg | Freq: Four times a day (QID) | INTRAMUSCULAR | Status: DC | PRN
  Administered 2019-09-01 – 2019-09-15 (×3): 4 mg via INTRAVENOUS
  Filled 2019-08-31 (×3): qty 2

## 2019-08-31 MED ORDER — SODIUM CHLORIDE 0.9 % IV SOLN
1.0000 g | Freq: Once | INTRAVENOUS | Status: AC
  Administered 2019-08-31: 1 g via INTRAVENOUS
  Filled 2019-08-31: qty 10

## 2019-08-31 MED ORDER — MAGNESIUM SULFATE 2 GM/50ML IV SOLN
2.0000 g | Freq: Once | INTRAVENOUS | Status: AC
  Administered 2019-08-31: 2 g via INTRAVENOUS
  Filled 2019-08-31: qty 50

## 2019-08-31 MED ORDER — GUAIFENESIN ER 600 MG PO TB12
1200.0000 mg | ORAL_TABLET | Freq: Two times a day (BID) | ORAL | Status: DC
  Administered 2019-08-31 – 2019-09-04 (×10): 1200 mg via ORAL
  Filled 2019-08-31 (×10): qty 2

## 2019-08-31 MED ORDER — METHYLPREDNISOLONE SODIUM SUCC 125 MG IJ SOLR
60.0000 mg | Freq: Four times a day (QID) | INTRAMUSCULAR | Status: DC
  Administered 2019-08-31: 60 mg via INTRAVENOUS
  Filled 2019-08-31: qty 2

## 2019-08-31 MED ORDER — FUROSEMIDE 10 MG/ML IJ SOLN
40.0000 mg | Freq: Every day | INTRAMUSCULAR | Status: DC
  Administered 2019-08-31 – 2019-09-01 (×2): 40 mg via INTRAVENOUS
  Filled 2019-08-31 (×2): qty 4

## 2019-08-31 MED ORDER — MOMETASONE FURO-FORMOTEROL FUM 200-5 MCG/ACT IN AERO
2.0000 | INHALATION_SPRAY | Freq: Two times a day (BID) | RESPIRATORY_TRACT | Status: DC
  Administered 2019-08-31 – 2019-09-04 (×9): 2 via RESPIRATORY_TRACT
  Filled 2019-08-31: qty 8.8

## 2019-08-31 MED ORDER — SENNOSIDES-DOCUSATE SODIUM 8.6-50 MG PO TABS: 1.0000 | ORAL_TABLET | Freq: Every evening | ORAL | Status: DC | PRN

## 2019-08-31 MED ORDER — ALBUTEROL SULFATE (2.5 MG/3ML) 0.083% IN NEBU
2.5000 mg | INHALATION_SOLUTION | RESPIRATORY_TRACT | Status: DC | PRN
  Administered 2019-09-04 – 2019-09-10 (×3): 2.5 mg via RESPIRATORY_TRACT
  Filled 2019-08-31 (×4): qty 3

## 2019-08-31 MED ORDER — HYDROCODONE-ACETAMINOPHEN 5-325 MG PO TABS: 1.0000 | ORAL_TABLET | ORAL | Status: DC | PRN

## 2019-08-31 NOTE — Progress Notes (Signed)
  Echocardiogram 2D Echocardiogram has been attempted. Patient in bathroom. Will reattempt at later time.[name] 08/31/2019, 1:32 PM

## 2019-08-31 NOTE — Progress Notes (Signed)
  Echocardiogram 2D Echocardiogram has been performed.  Theodore [name] 08/31/2019, 3:55 PM

## 2019-08-31 NOTE — ED Notes (Signed)
Gave report to Bishnu, RN for room 1539.  

## 2019-08-31 NOTE — Progress Notes (Signed)
[facility] [place] OF CARE NOTE Patient: Theodore [name] [id number]   PCP: Biagio Borg, MD DOB: Apr 18, 1956   DOA: 08/30/2019   DOS: 08/31/2019    Patient was admitted by my colleague Dr. Myna Hidalgo earlier on 08/31/2019. I have reviewed the H&P as well as assessment and plan and agree with the same. Important changes in the plan are listed below.  Plan of care: Principal Problem:   COPD with acute exacerbation ([place]) Active Problems:   COPD exacerbation ([place])   CAD (coronary artery disease)   Chronic respiratory failure with hypoxia (HCC)   Recurrent pulmonary emboli (HCC)   Hyponatremia   Acute on chronic respiratory failure with hypoxia (HCC) bilateral lower extremity edema Check Echocardiogram  teds stocking Lasix IV  AECOPD Reduce steroids to 60 bid.   Author: Berle Mull, MD [facility] 08/31/2019 4:30 PM   If 7PM-7AM, please contact night-coverage at [email or web address]

## 2019-08-31 NOTE — Progress Notes (Signed)
Unable to apply TED hose due to edema in BLE. Donne Hazel, RN

## 2019-08-31 NOTE — H&P (Signed)
History and Physical    Theodore [name] WGN:[id number] DOB: 1956/03/15 DOA: 08/30/2019  PCP: [name], MD   Patient coming from: Home   Chief Complaint: SOB   HPI: Theodore [name] is a 63 y.o. male with medical history significant for steroid-dependent COPD, chronic hypoxic respiratory failure, coronary artery disease, chronic leg swelling, and history of PE on Xarelto, now presenting to the emergency department for evaluation of shortness of breath.  Patient reports increased exertional dyspnea for approximately 2 days, worsened today with increased productive cough and wheezing, and was unable to recover after becoming acutely dyspneic with mild exertion, prompting him to call EMS.  He received albuterol and 125 mg of IV Solu-Medrol prior to arrival in the ED.  He denies any recent fevers or chills.  He denies any chest pain.  Reports continued adherence with his Xarelto.  Reports some improvement with albuterol.  ED Course: Upon arrival to the ED, patient is found to be tachycardic, with labored respirations, on 6 L/min of supplemental oxygen, and with stable blood pressure.  EKG features sinus tachycardia with rate 112.  Chest x-ray is notable for chronic hyperinflation and emphysema without acute findings.  Chemistry panel is notable for sodium of 133.  CBC features a leukocytosis to 12,300 and a mild thrombocytosis.  Troponin and BNP are normal.  COVID-19 is negative.  Patient was treated with IV magnesium, Atrovent, albuterol, and Rocephin in the ED.  Review of Systems:  All other systems reviewed and apart from HPI, are negative.  Past Medical History:  Diagnosis Date  . Anxiety   . Arthritis    "back" (01/24/2014)  . Asthma   . Chronic bronchitis (HCC)   . Cluster headache    "last bout was summer 2014; had them q spring 1991-2001" (01/24/2014)  . COPD (chronic obstructive pulmonary disease) (HCC)   . GERD (gastroesophageal reflux disease)   . NSTEMI (non-ST elevated  myocardial infarction) (HCC) 01/2014  . On home oxygen therapy    "2L; 24/7" (01/24/2014)  . Pneumonia 06/2013; 01/2014  . Pulmonary embolism (HCC) 01/23/2014  . Seasonal allergies     Past Surgical History:  Procedure Laterality Date  . COLONOSCOPY WITH PROPOFOL N/A 10/20/2016   Procedure: COLONOSCOPY WITH PROPOFOL;  Surgeon: Sherrilyn Rist, MD;  Location: [facility];  Service: Gastroenterology;  Laterality: N/A;  . NASAL SEPTOPLASTY W/ TURBINOPLASTY Left 1983  . TOTAL HIP ARTHROPLASTY Left 07/16/2017   Procedure: TOTAL HIP ARTHROPLASTY ANTERIOR APPROACH;  Surgeon: Tarry Kos, MD;  Location: [facility] OR;  Service: Orthopedics;  Laterality: Left;  . TURBINATE REDUCTION Bilateral ~ 1983     reports that he quit smoking about 7 years ago. His smoking use included cigarettes. He has a 70.00 pack-year smoking history. He has never used smokeless tobacco. He reports previous alcohol use. He reports that he does not use drugs.  Allergies  Allergen Reactions  . Naproxen Sodium Itching    Family History  Problem Relation Age of Onset  . Leukemia Father   . Heart disease Father   . Asthma Father   . Asthma Sister   . Emphysema Sister   . Heart disease Sister   . High blood pressure Mother   . Colon cancer Cousin        First cousin     Prior to Admission medications   Medication Sig Start Date End Date Taking? Authorizing Provider  albuterol (PROVENTIL) (2.5 MG/3ML) 0.083% nebulizer solution Take 3 mLs (2.5 mg  total) by nebulization every 6 (six) hours as needed for wheezing or shortness of breath. 12/02/18   Lupita Leash, MD  azithromycin (ZITHROMAX) 250 MG tablet Take 1 tab on Monday, Wednesday and Friday's Patient not taking: Reported on 08/23/2019 07/05/19   Glenford Bayley, NP  budesonide-formoterol (SYMBICORT) 160-4.5 MCG/ACT inhaler INHALE 2 PUFFS FIRST THING IN THE MORNING AND ANOTHER 2 PUFFS ABOUT 12 HOURS LATER 04/12/19   [name], MD  fluticasone [facility]) 50  MCG/ACT nasal spray Place 1 spray into both nostrils daily.    [provider]  guaiFENesin (MUCINEX) 600 MG 12 hr tablet Take 2 tablets (1,200 mg total) by mouth 2 (two) times daily. 07/05/19   Glenford Bayley, NP  montelukast (SINGULAIR) 10 MG tablet TAKE 1 TABLET(10 MG) BY MOUTH AT BEDTIME 06/26/19   Glenford Bayley, NP  Multiple Vitamin (MULTI VITAMIN PO) Take 1 tablet by mouth daily.    [provider]  nicotine polacrilex (NICORETTE) 2 MG gum Take 2 mg by mouth as needed for smoking cessation.    [provider]  OXYGEN Inhale 2 L/min into the lungs as needed. For sats less than 90%    [provider]  predniSONE (DELTASONE) 10 MG tablet TAKE 1 TABLET BY MOUTH DAILY 08/02/19   Lupita Leash, MD  PROAIR HFA [phone] Base) MCG/ACT inhaler INHALE 2 PUFFS BY MOUTH EVERY 6 HOURS AS NEEDED FOR WHEEZING OR SHORTNESS OF BREATH 12/02/18   Lupita Leash, MD  rivaroxaban (XARELTO) 20 MG TABS tablet TAKE 1 TABLET(20 MG) BY MOUTH EVERY EVENING 04/12/19   [name], MD  sodium chloride (OCEAN) 0.65 % SOLN nasal spray Place 1 spray into both nostrils 4 (four) times daily as needed for congestion.    [provider]  tiotropium (SPIRIVA HANDIHALER) 18 MCG inhalation capsule INHALE THE CONTENTS OF 1 CAPSULE VIA INHALATION DEVICE EVERY DAY 05/29/19   Lupita Leash, MD  triamterene-hydrochlorothiazide (MAXZIDE-25) 37.5-25 MG tablet Take 1 tablet by mouth daily. 04/12/19   [name], MD    Physical Exam: Vitals:   08/31/19 0100 08/31/19 0130 08/31/19 0200 08/31/19 0211  BP: 123/78 129/84 138/84   Pulse: (!) 104 100 97   Resp: 18 18 16    SpO2: 97% 98% 95% 97%  Weight:      Height:        Constitutional: Not in acute distress, no diaphoresis   Eyes: PERTLA, lids and conjunctivae normal ENMT: Mucous membranes are moist. Posterior pharynx clear of any exudate or lesions.   Neck: normal, supple, no masses, no thyromegaly Respiratory: markedly  diminished breath sounds bilaterally with prolonged expiratory phase. No pallor or cyanosis.   Cardiovascular: S1 & S2 heard, regular rate and rhythm. Pretibial pitting edema bilaterally. No significant JVD. Abdomen: No distension, no tenderness, soft. Bowel sounds active.  Musculoskeletal: no clubbing / cyanosis. No joint deformity upper and lower extremities.    Skin: no significant rashes, lesions, ulcers. Warm, dry, well-perfused. Neurologic: no facial asymmetry. Sensation intact. Moving all extremities.  Psychiatric: Alert and oriented x 3. Calm, cooperative.    Labs on Admission: I have personally reviewed following labs and imaging studies  CBC: Recent Labs  Lab 08/30/19 2339 08/31/19 0014  WBC 12.3*  --   NEUTROABS 9.1*  --   HGB 14.2 14.3  HCT 43.6 42.0  MCV 95.4  --   PLT 408*  --    Basic Metabolic Panel: Recent Labs  Lab 08/31/19  0014  NA 133*  K 3.7  CL 98  GLUCOSE 113*  BUN 16  CREATININE 0.90   GFR: Estimated Creatinine Clearance: 108.6 mL/min (by C-G formula based on SCr of 0.9 mg/dL). Liver Function Tests: No results for input(s): AST, ALT, ALKPHOS, BILITOT, PROT, ALBUMIN in the last 168 hours. No results for input(s): LIPASE, AMYLASE in the last 168 hours. No results for input(s): AMMONIA in the last 168 hours. Coagulation Profile: No results for input(s): INR, PROTIME in the last 168 hours. Cardiac Enzymes: No results for input(s): CKTOTAL, CKMB, CKMBINDEX, TROPONINI in the last 168 hours. BNP (last 3 results) No results for input(s): PROBNP in the last 8760 hours. HbA1C: No results for input(s): HGBA1C in the last 72 hours. CBG: No results for input(s): GLUCAP in the last 168 hours. Lipid Profile: No results for input(s): CHOL, HDL, LDLCALC, TRIG, CHOLHDL, LDLDIRECT in the last 72 hours. Thyroid Function Tests: No results for input(s): TSH, T4TOTAL, FREET4, T3FREE, THYROIDAB in the last 72 hours. Anemia Panel: No results for input(s):  VITAMINB12, FOLATE, FERRITIN, TIBC, IRON, RETICCTPCT in the last 72 hours. Urine analysis:    Component Value Date/Time   COLORURINE YELLOW 05/17/2008 2303   APPEARANCEUR CLEAR 05/17/2008 2303   LABSPEC 1.024 05/17/2008 2303   PHURINE 6.5 05/17/2008 2303   GLUCOSEU NEGATIVE 05/17/2008 2303   HGBUR TRACE (A) 05/17/2008 2303   BILIRUBINUR NEGATIVE 05/17/2008 2303   KETONESUR NEGATIVE 05/17/2008 2303   PROTEINUR NEGATIVE 05/17/2008 2303   UROBILINOGEN 1.0 05/17/2008 2303   NITRITE NEGATIVE 05/17/2008 2303   LEUKOCYTESUR NEGATIVE 05/17/2008 2303   Sepsis Labs: @LABRCNTIP (procalcitonin:4,lacticidven:4) ) Recent Results (from the past 240 hour(s))  SARS Coronavirus 2 by RT PCR (hospital order, performed in [facility] hospital lab) Nasopharyngeal Nasopharyngeal Swab     Status: None   Collection Time: 08/30/19 11:40 PM   Specimen: Nasopharyngeal Swab  Result Value Ref Range Status   SARS Coronavirus 2 NEGATIVE NEGATIVE Final    Comment: (NOTE) If result is NEGATIVE SARS-CoV-2 target nucleic acids are NOT DETECTED. The SARS-CoV-2 RNA is generally detectable in upper and lower  respiratory specimens during the acute phase of infection. The lowest  concentration of SARS-CoV-2 viral copies this assay can detect is 250  copies / mL. A negative result does not preclude SARS-CoV-2 infection  and should not be used as the sole basis for treatment or other  patient management decisions.  A negative result may occur with  improper specimen collection / handling, submission of specimen other  than nasopharyngeal swab, presence of viral mutation(s) within the  areas targeted by this assay, and inadequate number of viral copies  (<250 copies / mL). A negative result must be combined with clinical  observations, patient history, and epidemiological information. If result is POSITIVE SARS-CoV-2 target nucleic acids are DETECTED. The SARS-CoV-2 RNA is generally detectable in upper and lower   respiratory specimens dur ing the acute phase of infection.  Positive  results are indicative of active infection with SARS-CoV-2.  Clinical  correlation with patient history and other diagnostic information is  necessary to determine patient infection status.  Positive results do  not rule out bacterial infection or co-infection with other viruses. If result is PRESUMPTIVE POSTIVE SARS-CoV-2 nucleic acids MAY BE PRESENT.   A presumptive positive result was obtained on the submitted specimen  and confirmed on repeat testing.  While 2019 novel coronavirus  (SARS-CoV-2) nucleic acids may be present in the submitted sample  additional confirmatory testing may be  necessary for epidemiological  and / or clinical management purposes  to differentiate between  SARS-CoV-2 and other Sarbecovirus currently known to infect humans.  If clinically indicated additional testing with an alternate test  methodology [phone]([id number]) is advised. The SARS-CoV-2 RNA is generally  detectable in upper and lower respiratory sp ecimens during the acute  phase of infection. The expected result is Negative. Fact Sheet for Patients:  BoilerBrush.com.cyhttps://www.fda.gov/media/136312/download Fact Sheet for Healthcare Providers: https://pope.com/https://www.fda.gov/media/136313/download This test is not yet approved or cleared by the [place] FDA and has been authorized for detection and/or diagnosis of SARS-CoV-2 by FDA under an Emergency Use Authorization (EUA).  This EUA will remain in effect (meaning this test can be used) for the duration of the COVID-19 declaration under Section 564(b)(1) of the Act, 21 U.S.C. section 360bbb-3(b)(1), unless the authorization is terminated or revoked sooner. Performed at [facility], [address] [place], [place] [address]      Radiological Exams on Admission: Dg Chest Portable 1 View  Result Date: 08/31/2019 CLINICAL DATA:  Shortness of breath. Cough. Wheezing. COPD. EXAM:  PORTABLE CHEST 1 VIEW COMPARISON:  Radiograph 07/17/2017, CT 12/03/2016 FINDINGS: Chronic hyperinflation and emphysema. Unchanged heart size and mediastinal contours. No acute airspace disease, pulmonary edema, large pleural effusion or pneumothorax. There is biapical pleuroparenchymal scarring. No acute osseous abnormalities. IMPRESSION: Chronic hyperinflation and emphysema. No superimposed acute abnormality. Electronically Signed   By: Narda RutherfordMelanie  Sanford M.D.   On: 08/31/2019 00:15    EKG: Independently reviewed. Sinus tachycardia (rate 112), QTc 441 ms.   Assessment/Plan   1. COPD exacerbation; acute on chronic hypoxic respiratory failure  - Presents with SOB, wheezing, increased productive cough, and increased supplemental O2 requirements, found to have clear CXR, improved with bronchodilators but still dyspneic at rest  - COVID-19 is negative  - Check sputum culture, start azithromycin, continue Solu-Medrol, schedule duoneb, continue ICS/LABA, use albuterol as needed    2. Hyponatremia   - Sodium is 133 on admission; patient appears hypervolemic - SLIV, continue diuretics, repeat chemistries in am   3. Hx of PE  - Continue Xarelto     PPE: mask, face shield  DVT prophylaxis: Xarelto  Code Status: Full  Family Communication: Discussed with patient  Consults called: none  Admission status: Observation     Briscoe Deutscherimothy S Shoua Ressler, MD [facility] Pager [phone]  If 7PM-7AM, please contact night-coverage [email or web address] Password [email or web address]  08/31/2019, 2:39 AM

## 2019-08-31 NOTE — Significant Event (Signed)
Rapid Response Event Note  Overview: Time Called: 7628 Arrival Time: 3151 Event Type: Respiratory  Initial Focused Assessment:  RRT called as patient was ambulating to the restroom and became extremely short of breath. On assessment patient positive for expiratory / inspiratory wheezes. RT called for neb treatment. Patient placed on monitor with a NSR on 5 lead EKG, O2 sats 89, once neb complete O2 sats 98. Patient then helped back to bed and educated that for time being may be best to utilize [facility] as lasix was decreasing fluid overload.  VSS patient resting and back to his baseline home O2. RRT available as needed.    Event Summary: Name of Physician Notified: Marlowe Sax. at 1345    at    Outcome: Stayed in room and stabalized     [place]

## 2019-09-01 LAB — COMPREHENSIVE METABOLIC PANEL
ALT: 15 U/L (ref 0–44)
AST: 21 U/L (ref 15–41)
Albumin: 3.3 g/dL — ABNORMAL LOW (ref 3.5–5.0)
Alkaline Phosphatase: 47 U/L (ref 38–126)
Anion gap: 9 (ref 5–15)
BUN: 17 mg/dL (ref 8–23)
CO2: 23 mmol/L (ref 22–32)
Calcium: 8.7 mg/dL — ABNORMAL LOW (ref 8.9–10.3)
Chloride: 99 mmol/L (ref 98–111)
Creatinine, Ser: 0.85 mg/dL (ref 0.61–1.24)
GFR calc Af Amer: >60 mL/min (ref >60)
GFR calc non Af Amer: >60 mL/min (ref >60)
Glucose, Bld: 140 mg/dL — ABNORMAL HIGH (ref 70–99)
Potassium: 4.5 mmol/L (ref 3.5–5.1)
Sodium: 131 mmol/L — ABNORMAL LOW (ref 135–145)
Total Bilirubin: 0.8 mg/dL (ref 0.3–1.2)
Total Protein: 6.2 g/dL — ABNORMAL LOW (ref 6.5–8.1)

## 2019-09-01 LAB — CBC WITH DIFFERENTIAL/PLATELET
Abs Immature Granulocytes: 0.25 10*3/uL — ABNORMAL HIGH (ref 0.00–0.07)
Basophils Absolute: 0 10*3/uL (ref 0.0–0.1)
Basophils Relative: 0 %
Eosinophils Absolute: 0 10*3/uL (ref 0.0–0.5)
Eosinophils Relative: 0 %
HCT: 38.9 % — ABNORMAL LOW (ref 39.0–52.0)
Hemoglobin: 12.7 g/dL — ABNORMAL LOW (ref 13.0–17.0)
Immature Granulocytes: 2 %
Lymphocytes Relative: 4 %
Lymphs Abs: 0.7 10*3/uL (ref 0.7–4.0)
MCH: 31.7 pg (ref 26.0–34.0)
MCHC: 32.6 g/dL (ref 30.0–36.0)
MCV: 97 fL (ref 80.0–100.0)
Monocytes Absolute: 0.5 10*3/uL (ref 0.1–1.0)
Monocytes Relative: 3 %
Neutro Abs: 14.9 10*3/uL — ABNORMAL HIGH (ref 1.7–7.7)
Neutrophils Relative %: 91 %
Platelets: 357 10*3/uL (ref 150–400)
RBC: 4.01 MIL/uL — ABNORMAL LOW (ref 4.22–5.81)
RDW: 13.5 % (ref 11.5–15.5)
WBC: 16.4 10*3/uL — ABNORMAL HIGH (ref 4.0–10.5)
nRBC: 0 % (ref 0.0–0.2)

## 2019-09-01 LAB — GLUCOSE, CAPILLARY: Glucose-Capillary: 142 mg/dL — ABNORMAL HIGH (ref 70–99)

## 2019-09-01 MED ORDER — FAMOTIDINE 20 MG PO TABS
20.0000 mg | ORAL_TABLET | Freq: Every day | ORAL | Status: DC
  Administered 2019-09-01 – 2019-09-04 (×4): 20 mg via ORAL
  Filled 2019-09-01 (×4): qty 1

## 2019-09-01 MED ORDER — IPRATROPIUM-ALBUTEROL 0.5-2.5 (3) MG/3ML IN SOLN
3.0000 mL | Freq: Three times a day (TID) | RESPIRATORY_TRACT | Status: DC
  Administered 2019-09-02 – 2019-09-05 (×9): 3 mL via RESPIRATORY_TRACT
  Filled 2019-09-01 (×10): qty 3

## 2019-09-01 MED ORDER — ALUM & MAG HYDROXIDE-SIMETH 200-200-20 MG/5ML PO SUSP
30.0000 mL | ORAL | Status: DC | PRN
  Administered 2019-09-01: 30 mL via ORAL
  Filled 2019-09-01: qty 30

## 2019-09-01 NOTE — Progress Notes (Signed)
[facility] Progress Note  Patient: Theodore [name] OMV:[id number]   PCP: [name], MD DOB: [birth year]   DOA: 08/30/2019   DOS: 09/01/2019   Date of Service: the patient was seen and examined on 09/01/2019  Chief Complaint  Patient presents with  . Shortness of Breath   Brief hospital course: Theodore [name] is a 63 y.o. male with medical history significant for steroid-dependent COPD, chronic hypoxic respiratory failure, coronary artery disease, chronic leg swelling, and history of PE on Xarelto, now presenting to the emergency department for evaluation of shortness of breath.  Patient reports increased exertional dyspnea for approximately 2 days, worsened today with increased productive cough and wheezing, and was unable to recover after becoming acutely dyspneic with mild exertion, prompting him to call EMS.  He received albuterol and 125 mg of IV Solu-Medrol prior to arrival in the ED.  He denies any recent fevers or chills.  He denies any chest pain.  Reports continued adherence with his Xarelto.  Reports some improvement with albuterol.  Currently further plan is continue treating COPD as well as continue diuresis.  Subjective: Reports shortness of breath but some improvement.  Continues to have cough.  No chest pain abdominal pain.  Swelling in the leg is still present.  Assessment and Plan: 1. COPD exacerbation; acute on chronic hypoxic respiratory failure  - Presents with SOB, wheezing, increased productive cough, and increased supplemental O2 requirements, found to have clear CXR, improved with bronchodilators but still dyspneic at rest  - COVID-19 is negative  - Check sputum culture, start azithromycin, continue Solu-Medrol, schedule duoneb, continue ICS/LABA, use albuterol as needed    2. Hyponatremia   - Sodium is 133 on admission; patient appears hypervolemic - SLIV, continue diuretics, repeat chemistries in am   3. Hx of PE  - Continue Xarelto   Low yield  but will still perform lower extremity Doppler to rule out DVT given significant swelling in the leg  4.  Acute on chronic diastolic CHF. Continue IV Lasix pretension echocardiogram shows preserved EF no acute abnormality otherwise.  Diet: Cardiac diet  DVT Prophylaxis: Therapeutic Anticoagulation with Xarelto  Advance goals of care discussion: Full code  Family Communication: no family was present at bedside, at the time of interview.   Disposition:  Discharge to be determined .  Consultants: none Procedures: none  Scheduled Meds: . famotidine  20 mg Oral Daily  . furosemide  40 mg Intravenous Daily  . guaiFENesin  1,200 mg Oral BID  . [START ON 09/02/2019] ipratropium-albuterol  3 mL Nebulization TID  . methylPREDNISolone (SOLU-MEDROL) injection  60 mg Intravenous BID  . mometasone-formoterol  2 puff Inhalation BID  . montelukast  10 mg Oral QHS  . rivaroxaban  20 mg Oral Daily  . sodium chloride flush  3 mL Intravenous Q12H   Continuous Infusions: . sodium chloride 10 mL/hr at 08/31/19 0224  . sodium chloride    . azithromycin 500 mg (09/01/19 2108)   PRN Meds: sodium chloride, sodium chloride, acetaminophen **OR** acetaminophen, albuterol, albuterol, alum & mag hydroxide-simeth, HYDROcodone-acetaminophen, ipratropium, nicotine polacrilex, ondansetron **OR** ondansetron (ZOFRAN) IV, senna-docusate, sodium chloride, sodium chloride flush Antibiotics: Anti-infectives (From admission, onward)   Start     Dose/Rate Route Frequency Ordered Stop   08/31/19 0245  azithromycin (ZITHROMAX) 500 mg in sodium chloride 0.9 % 250 mL IVPB     500 mg 250 mL/hr over 60 Minutes Intravenous Daily at bedtime 08/31/19 0239     08/31/19 0130  cefTRIAXone (ROCEPHIN) 1 g in sodium chloride 0.9 % 100 mL IVPB     1 g 200 mL/hr over 30 Minutes Intravenous  Once 08/31/19 0121 08/31/19 0255       Objective: Physical Exam: Vitals:   09/01/19 1346 09/01/19 1400 09/01/19 1941 09/01/19 2118  BP:  (!) 149/92   138/83  Pulse: 96   94  Resp: 18   18  Temp: 98 F (36.7 C)   98.4 F (36.9 C)  TempSrc:    Oral  SpO2: 97% 98% 97% 96%  Weight:      Height:        Intake/Output Summary (Last 24 hours) at 09/01/2019 2133 Last data filed at 09/01/2019 2109 Gross per 24 hour  Intake 1502.12 ml  Output 4851 ml  Net -3348.88 ml   Filed Weights   08/30/19 2331  Weight: 104.3 kg   General: alert and oriented to time, place, and person. Appear in mild distress, affect appropriate Eyes: PERRL, Conjunctiva normal ENT: Oral Mucosa Clear, moist  Neck: no JVD, no Abnormal Mass Or lumps Cardiovascular: S1 and S2 Present, no Murmur, peripheral pulses symmetrical Respiratory: good respiratory effort, Bilateral Air entry equal and Decreased, no signs of accessory muscle use, bilateral  Crackles, bilateral  wheezes Abdomen: Bowel Sound present, Soft and no tenderness, no hernia Skin: no rashes  Extremities: bilateral  Pedal edema, no calf tenderness Neurologic: without any new focal findings Gait not checked due to patient safety concerns  Data Reviewed: I have personally reviewed and interpreted daily labs, tele strips, imagings as discussed above. I reviewed all nursing notes, pharmacy notes, vitals, pertinent old records I have discussed plan of care as described above with RN and patient/family.  CBC: Recent Labs  Lab 08/30/19 2339 08/31/19 0014 08/31/19 0321 09/01/19 0309  WBC 12.3*  --  14.8* 16.4*  NEUTROABS 9.1*  --  13.9* 14.9*  HGB 14.2 14.3 14.1 12.7*  HCT 43.6 42.0 44.1 38.9*  MCV 95.4  --  96.7 97.0  PLT 408*  --  351 856   Basic Metabolic Panel: Recent Labs  Lab 08/31/19 0014 08/31/19 0321 09/01/19 0309  NA 133* 132* 131*  K 3.7 4.2 4.5  CL 98 96* 99  CO2  --  23 23  GLUCOSE 113* 165* 140*  BUN 16 17 17   CREATININE 0.90 0.97 0.85  CALCIUM  --  9.0 8.7*    Liver Function Tests: Recent Labs  Lab 08/31/19 0321 09/01/19 0309  AST 19 21  ALT 17 15   ALKPHOS 54 47  BILITOT 0.6 0.8  PROT 6.7 6.2*  ALBUMIN 3.7 3.3*   No results for input(s): LIPASE, AMYLASE in the last 168 hours. No results for input(s): AMMONIA in the last 168 hours. Coagulation Profile: No results for input(s): INR, PROTIME in the last 168 hours. Cardiac Enzymes: No results for input(s): CKTOTAL, CKMB, CKMBINDEX, TROPONINI in the last 168 hours. BNP (last 3 results) No results for input(s): PROBNP in the last 8760 hours. CBG: Recent Labs  Lab 08/31/19 0805 09/01/19 0752  GLUCAP 168* 142*   Studies: No results found.   Time spent: 35 minutes  Author: Berle Mull, MD [facility] 09/01/2019 9:33 PM  To reach On-call, see care teams to locate the attending and reach out to them via [email or web address]. If 7PM-7AM, please contact night-coverage If you still have difficulty reaching the attending provider, please page the [facility] (Director on Call) for [facility] on amion for assistance.

## 2019-09-02 ENCOUNTER — Inpatient Hospital Stay (HOSPITAL_COMMUNITY): Payer: PPO

## 2019-09-02 LAB — CBC
HCT: 38.2 % — ABNORMAL LOW (ref 39.0–52.0)
Hemoglobin: 12.5 g/dL — ABNORMAL LOW (ref 13.0–17.0)
MCH: 31.7 pg (ref 26.0–34.0)
MCHC: 32.7 g/dL (ref 30.0–36.0)
MCV: 97 fL (ref 80.0–100.0)
Platelets: 338 10*3/uL (ref 150–400)
RBC: 3.94 MIL/uL — ABNORMAL LOW (ref 4.22–5.81)
RDW: 13.5 % (ref 11.5–15.5)
WBC: 16.3 10*3/uL — ABNORMAL HIGH (ref 4.0–10.5)
nRBC: 0 % (ref 0.0–0.2)

## 2019-09-02 LAB — BASIC METABOLIC PANEL
Anion gap: 10 (ref 5–15)
BUN: 24 mg/dL — ABNORMAL HIGH (ref 8–23)
CO2: 26 mmol/L (ref 22–32)
Calcium: 8.5 mg/dL — ABNORMAL LOW (ref 8.9–10.3)
Chloride: 96 mmol/L — ABNORMAL LOW (ref 98–111)
Creatinine, Ser: 0.85 mg/dL (ref 0.61–1.24)
GFR calc Af Amer: >60 mL/min (ref >60)
GFR calc non Af Amer: >60 mL/min (ref >60)
Glucose, Bld: 135 mg/dL — ABNORMAL HIGH (ref 70–99)
Potassium: 4.4 mmol/L (ref 3.5–5.1)
Sodium: 132 mmol/L — ABNORMAL LOW (ref 135–145)

## 2019-09-02 LAB — GLUCOSE, CAPILLARY: Glucose-Capillary: 124 mg/dL — ABNORMAL HIGH (ref 70–99)

## 2019-09-02 LAB — MAGNESIUM: Magnesium: 2.2 mg/dL (ref 1.7–2.4)

## 2019-09-02 MED ORDER — FUROSEMIDE 10 MG/ML IJ SOLN
40.0000 mg | Freq: Two times a day (BID) | INTRAMUSCULAR | Status: DC
  Administered 2019-09-02 – 2019-09-03 (×3): 40 mg via INTRAVENOUS
  Filled 2019-09-02 (×2): qty 4

## 2019-09-02 MED ORDER — HYDROCOD POLST-CPM POLST ER 10-8 MG/5ML PO SUER: 5.0000 mL | Freq: Two times a day (BID) | ORAL | Status: DC | PRN

## 2019-09-02 MED ORDER — METHYLPREDNISOLONE SODIUM SUCC 125 MG IJ SOLR
60.0000 mg | Freq: Four times a day (QID) | INTRAMUSCULAR | Status: DC
  Administered 2019-09-02 – 2019-09-04 (×10): 60 mg via INTRAVENOUS
  Filled 2019-09-02 (×10): qty 2

## 2019-09-02 MED ORDER — BENZONATATE 100 MG PO CAPS
100.0000 mg | ORAL_CAPSULE | Freq: Three times a day (TID) | ORAL | Status: DC
  Administered 2019-09-02 – 2019-09-04 (×8): 100 mg via ORAL
  Filled 2019-09-02 (×8): qty 1

## 2019-09-02 MED ORDER — DOXYCYCLINE HYCLATE 100 MG PO TABS
100.0000 mg | ORAL_TABLET | Freq: Two times a day (BID) | ORAL | Status: DC
  Administered 2019-09-02 – 2019-09-04 (×5): 100 mg via ORAL
  Filled 2019-09-02 (×5): qty 1

## 2019-09-02 MED ORDER — FLUTICASONE PROPIONATE 50 MCG/ACT NA SUSP
2.0000 | Freq: Every day | NASAL | Status: DC
  Administered 2019-09-02 – 2019-09-18 (×15): 2 via NASAL
  Filled 2019-09-02: qty 16

## 2019-09-02 NOTE — Plan of Care (Signed)
Patient lying in bed this morning; pain controlled at this time. No needs expressed. Will continue to monitor.  

## 2019-09-02 NOTE — Progress Notes (Signed)
[facility] Progress Note  Patient: Theodore [name] [id number]:[id number]   PCP: Theodore [name], Theodore W, MD DOB: 29-Apr-1956   DOA: 08/30/2019   DOS: 09/02/2019   Date of Service: the patient was seen and examined on 09/02/2019  Chief Complaint  Patient presents with  . Shortness of Breath   Brief hospital course: Theodore [name] is a 63 y.o. male with medical history significant for steroid-dependent COPD, chronic hypoxic respiratory failure, coronary artery disease, chronic leg swelling, and history of PE on Xarelto, now presenting to the emergency department for evaluation of shortness of breath.  Patient reports increased exertional dyspnea for approximately 2 days, worsened today with increased productive cough and wheezing, and was unable to recover after becoming acutely dyspneic with mild exertion, prompting him to call EMS.  He received albuterol and 125 mg of IV Solu-Medrol prior to arrival in the ED.  He denies any recent fevers or chills.  He denies any chest pain.  Reports continued adherence with his Xarelto.  Reports some improvement with albuterol.  Currently further plan is continue treating COPD as well as continue diuresis.  Subjective: Continues to have shortness of breath now has more cough.  Continues to have swelling of the leg.  No chest pain no abdominal pain.  Episode of vomiting yesterday.  Assessment and Plan: 1. COPD exacerbation; acute on chronic hypoxic respiratory failure  - Presents with SOB, wheezing, increased productive cough, and increased supplemental O2 requirements, found to have clear CXR, improved with bronchodilators but still dyspneic at rest  - COVID-19 is negative  - Check sputum culture, start azithromycin, continue Solu-Medrol, increasing Solu-Medrol to 4 times daily.  Continue Flomax.  Schedule duoneb, continue ICS/LABA, use albuterol as needed   Change antibiotic from azithromycin to doxycycline.  2. Hyponatremia   resolved - Sodium is 133 on  admission; patient appears hypervolemic - SLIV, continue diuretics, repeat chemistries in am   3. Hx of PE  - Continue Xarelto   Low yield but will still perform lower extremity Doppler to rule out DVT given significant swelling in the leg  4.  Acute on chronic diastolic CHF. Continue IV Lasix pretension echocardiogram shows preserved EF no acute abnormality otherwise. Increase Lasix to twice daily.  Diet: Cardiac diet  DVT Prophylaxis: Therapeutic Anticoagulation with Xarelto  Advance goals of care discussion: Full code  Family Communication: no family was present at bedside, at the time of interview.   Disposition:  Discharge to be determined .  Consultants: none Procedures: none  Scheduled Meds: . benzonatate  100 mg Oral TID  . doxycycline  100 mg Oral Q12H  . famotidine  20 mg Oral Daily  . fluticasone  2 spray Each Nare Daily  . furosemide  40 mg Intravenous BID  . guaiFENesin  1,200 mg Oral BID  . ipratropium-albuterol  3 mL Nebulization TID  . methylPREDNISolone (SOLU-MEDROL) injection  60 mg Intravenous QID  . mometasone-formoterol  2 puff Inhalation BID  . montelukast  10 mg Oral QHS  . rivaroxaban  20 mg Oral Daily  . sodium chloride flush  3 mL Intravenous Q12H   Continuous Infusions: . sodium chloride 10 mL/hr at 09/02/19 1400  . sodium chloride     PRN Meds: sodium chloride, sodium chloride, acetaminophen **OR** acetaminophen, albuterol, albuterol, alum & mag hydroxide-simeth, chlorpheniramine-HYDROcodone, HYDROcodone-acetaminophen, ipratropium, nicotine polacrilex, ondansetron **OR** ondansetron (ZOFRAN) IV, senna-docusate, sodium chloride, sodium chloride flush Antibiotics: Anti-infectives (From admission, onward)   Start     Dose/Rate Route Frequency Ordered  Stop   09/02/19 1000  doxycycline (VIBRA-TABS) tablet 100 mg     100 mg Oral Every 12 hours 09/02/19 0941     08/31/19 0245  azithromycin (ZITHROMAX) 500 mg in sodium chloride 0.9 % 250 mL IVPB   Status:  Discontinued     500 mg 250 mL/hr over 60 Minutes Intravenous Daily at bedtime 08/31/19 0239 09/02/19 0941   08/31/19 0130  cefTRIAXone (ROCEPHIN) 1 g in sodium chloride 0.9 % 100 mL IVPB     1 g 200 mL/hr over 30 Minutes Intravenous  Once 08/31/19 0121 08/31/19 0255       Objective: Physical Exam: Vitals:   09/01/19 2118 09/02/19 0612 09/02/19 0803 09/02/19 1404  BP: 138/83 (!) 145/88  (!) 152/80  Pulse: 94 78  82  Resp: 18 18  18   Temp: 98.4 F (36.9 C) 98 F (36.7 C)  97.8 F (36.6 C)  TempSrc: Oral Oral  Oral  SpO2: 96% 94% 91% 97%  Weight:      Height:        Intake/Output Summary (Last 24 hours) at 09/02/2019 1828 Last data filed at 09/02/2019 1400 Gross per 24 hour  Intake 1637.5 ml  Output 3550 ml  Net -1912.5 ml   Filed Weights   08/30/19 2331  Weight: 104.3 kg   General: alert and oriented to time, place, and person. Appear in mild distress, affect appropriate Eyes: PERRL, Conjunctiva normal ENT: Oral Mucosa Clear, moist  Neck: no JVD, no Abnormal Mass Or lumps Cardiovascular: S1 and S2 Present, no Murmur, peripheral pulses symmetrical Respiratory: good respiratory effort, Bilateral Air entry equal and Decreased, no signs of accessory muscle use, bilateral  Crackles, bilateral  wheezes Abdomen: Bowel Sound present, Soft and no tenderness, no hernia Skin: no rashes  Extremities: bilateral  Pedal edema, no calf tenderness Neurologic: without any new focal findings Gait not checked due to patient safety concerns  Data Reviewed: I have personally reviewed and interpreted daily labs, tele strips, imagings as discussed above. I reviewed all nursing notes, pharmacy notes, vitals, pertinent old records I have discussed plan of care as described above with RN and patient/family.  CBC: Recent Labs  Lab 08/30/19 2339 08/31/19 0014 08/31/19 0321 09/01/19 0309 09/02/19 0407  WBC 12.3*  --  14.8* 16.4* 16.3*  NEUTROABS 9.1*  --  13.9* 14.9*  --    HGB 14.2 14.3 14.1 12.7* 12.5*  HCT 43.6 42.0 44.1 38.9* 38.2*  MCV 95.4  --  96.7 97.0 97.0  PLT 408*  --  351 357 951   Basic Metabolic Panel: Recent Labs  Lab 08/31/19 0014 08/31/19 0321 09/01/19 0309 09/02/19 0407  NA 133* 132* 131* 132*  K 3.7 4.2 4.5 4.4  CL 98 96* 99 96*  CO2  --  23 23 26   GLUCOSE 113* 165* 140* 135*  BUN 16 17 17  24*  CREATININE 0.90 0.97 0.85 0.85  CALCIUM  --  9.0 8.7* 8.5*  MG  --   --   --  2.2    Liver Function Tests: Recent Labs  Lab 08/31/19 0321 09/01/19 0309  AST 19 21  ALT 17 15  ALKPHOS 54 47  BILITOT 0.6 0.8  PROT 6.7 6.2*  ALBUMIN 3.7 3.3*   No results for input(s): LIPASE, AMYLASE in the last 168 hours. No results for input(s): AMMONIA in the last 168 hours. Coagulation Profile: No results for input(s): INR, PROTIME in the last 168 hours. Cardiac Enzymes: No results for input(s): CKTOTAL,  CKMB, CKMBINDEX, TROPONINI in the last 168 hours. BNP (last 3 results) No results for input(s): PROBNP in the last 8760 hours. CBG: Recent Labs  Lab 08/31/19 0805 09/01/19 0752 09/02/19 0745  GLUCAP 168* 142* 124*   Studies: Dg Abd Portable 1v  Result Date: 09/02/2019 CLINICAL DATA:  Brief episode of heartburn with nausea and vomiting yesterday. EXAM: PORTABLE ABDOMEN - 1 VIEW COMPARISON:  Abdominal x-ray dated July 18, 2017. FINDINGS: The bowel gas pattern is normal. Moderate amount of stool in the right colon. No radio-opaque calculi or other significant radiographic abnormality are seen. No acute osseous abnormality. Prior left hip total arthroplasty. IMPRESSION: No acute findings. Electronically Signed   By: Obie Dredge M.D.   On: 09/02/2019 13:17     Time spent: 35 minutes  Author: Lynden Oxford, MD [facility] 09/02/2019 6:28 PM  To reach On-call, see care teams to locate the attending and reach out to them via [email or web address]. If 7PM-7AM, please contact night-coverage If you still have difficulty reaching  the attending provider, please page the [facility] (Director on Call) for [facility] on amion for assistance.

## 2019-09-02 NOTE — Progress Notes (Signed)
Rt gave pt flutter valve. Pt knows and understands how to use. 

## 2019-09-02 NOTE — Evaluation (Signed)
Physical Therapy Evaluation Patient Details Name: Theodore [name] MRN: [id number] DOB: Nov 29, 1955 Today's Date: 09/02/2019   History of Present Illness  63 y.o. male with medical history significant for steroid-dependent COPD, chronic hypoxic respiratory failure, coronary artery disease, chronic leg swelling, and history of PE on Xarelto, now presenting to the emergency department for evaluation of shortness of breath. covid negative  Clinical Impression  Pt admitted with above diagnosis.  Pt reports independence at his baseline, only uses O2 at night. Currently able to amb short hallway distance, limited by fatigue and dyspnea. Will benefit from HHPT   Pt currently with functional limitations due to the deficits listed below (see PT Problem List). Pt will benefit from skilled PT to increase their independence and safety with mobility to allow discharge to the venue listed below.       Follow Up Recommendations Home health PT    Equipment Recommendations  None recommended by PT    Recommendations for Other Services       Precautions / Restrictions Precautions Precautions: Fall Precaution Comments: baseline O2 dependent at night only--2L Restrictions Weight Bearing Restrictions: No      Mobility  Bed Mobility Overal bed mobility: Needs Assistance Bed Mobility: Supine to Sit     Supine to sit: Supervision     General bed mobility comments: for safety and line management  Transfers Overall transfer level: Needs assistance Equipment used: Rolling walker (2 wheeled) Transfers: Sit to/from [facility] Sit to Stand: Min guard;Supervision Stand pivot transfers: Min guard       General transfer comment: for safety. transfers repeated x2 d/t chair switch (chair broken)  Ambulation/Gait Ambulation/Gait assistance: Min guard Gait Distance (Feet): 60 Feet Assistive device: (pt pushing O2 cart as RW simulation d/t floor had no O2 carriers) Gait  Pattern/deviations: Step-through pattern;Decreased stride length     General Gait Details: cues for postural awareness, breathing, self monitor DOE. 1 brief standing rest after amb 30'. SpO2=92-95% on 2-3L (pt on 2.5L at rest)  Financial trader [employer] (Stroke Patients Only)       Balance Overall balance assessment: Mild deficits observed, not formally tested(denies recent falls (hx of fall >3yr ago resulting in hip fx))                                           Pertinent Vitals/Pain Pain Assessment: No/denies pain    Home Living Family/patient expects to be discharged to:: Private residence Living Arrangements: (mother lives with pt, she is unable to assist) Available Help at Discharge: Available PRN/intermittently(sister) Type of Home: House Home Access: Stairs to enter Entrance Stairs-Rails: Right Entrance Stairs-Number of Steps: 4 Home Layout: One level Home Equipment: Clinical cytogeneticist - 4 wheels Additional Comments: sister helps with groceries and errands at least 1x/wk; pt states his mother is unable to assist him    Prior Function Level of Independence: Independent         Comments: used RW after hip fx     Hand Dominance        Extremity/Trunk Assessment   Upper Extremity Assessment Upper Extremity Assessment: Overall WFL for tasks assessed    Lower Extremity Assessment Lower Extremity Assessment: Overall WFL for tasks assessed       Communication   Communication: No difficulties  Cognition Arousal/Alertness:  Awake/alert Behavior During Therapy: Anxious(pt reports anxiety d/t dyspnea) Overall Cognitive Status: Within Functional Limits for tasks assessed                                        General Comments      Exercises General Exercises - Lower Extremity Ankle Circles/Pumps: AROM;Both;10 reps   Assessment/Plan    PT Assessment Patient needs continued PT  services  PT Problem List Decreased mobility;Decreased activity tolerance;Cardiopulmonary status limiting activity;Decreased knowledge of use of DME       PT Treatment Interventions DME instruction;Gait training;Therapeutic activities;Functional mobility training;Therapeutic exercise    PT Goals (Current goals can be found in the Care Plan section)  Acute Rehab PT Goals PT Goal Formulation: With patient Time For Goal Achievement: 09/15/19 Potential to Achieve Goals: Good    Frequency Min 3X/week   Barriers to discharge        Co-evaluation               AM-PAC PT "6 Clicks" Mobility  Outcome Measure Help needed turning from your back to your side while in a flat bed without using bedrails?: None Help needed moving from lying on your back to sitting on the side of a flat bed without using bedrails?: None Help needed moving to and from a bed to a chair (including a wheelchair)?: A Little Help needed standing up from a chair using your arms (e.g., wheelchair or bedside chair)?: A Little Help needed to walk in hospital room?: A Little Help needed climbing 3-5 steps with a railing? : A Little 6 Click Score: 20    End of Session   Activity Tolerance: Patient tolerated treatment well Patient left: with call bell/phone within reach;in chair   PT Visit Diagnosis: Difficulty in walking, not elsewhere classified (R26.2)    Time: 1025-8527 PT Time Calculation (min) (ACUTE ONLY): 31 min   Charges:   PT Evaluation $PT Eval Low Complexity: 1 Low PT Treatments $Gait Training: 8-22 mins        Drucilla Chalet, PT  Pager: [phone] Acute Rehab Dept [facility]): [phone]   09/02/2019   [facility] 09/02/2019, 12:07 PM

## 2019-09-03 LAB — BASIC METABOLIC PANEL
Anion gap: 11 (ref 5–15)
BUN: 28 mg/dL — ABNORMAL HIGH (ref 8–23)
CO2: 28 mmol/L (ref 22–32)
Calcium: 8.7 mg/dL — ABNORMAL LOW (ref 8.9–10.3)
Chloride: 95 mmol/L — ABNORMAL LOW (ref 98–111)
Creatinine, Ser: 1.14 mg/dL (ref 0.61–1.24)
GFR calc Af Amer: >60 mL/min (ref >60)
GFR calc non Af Amer: >60 mL/min (ref >60)
Glucose, Bld: 150 mg/dL — ABNORMAL HIGH (ref 70–99)
Potassium: 4 mmol/L (ref 3.5–5.1)
Sodium: 134 mmol/L — ABNORMAL LOW (ref 135–145)

## 2019-09-03 LAB — CBC
HCT: 41.5 % (ref 39.0–52.0)
Hemoglobin: 13.6 g/dL (ref 13.0–17.0)
MCH: 31.9 pg (ref 26.0–34.0)
MCHC: 32.8 g/dL (ref 30.0–36.0)
MCV: 97.2 fL (ref 80.0–100.0)
Platelets: 389 10*3/uL (ref 150–400)
RBC: 4.27 MIL/uL (ref 4.22–5.81)
RDW: 13.5 % (ref 11.5–15.5)
WBC: 15.3 10*3/uL — ABNORMAL HIGH (ref 4.0–10.5)
nRBC: 0 % (ref 0.0–0.2)

## 2019-09-03 LAB — MAGNESIUM: Magnesium: 2.3 mg/dL (ref 1.7–2.4)

## 2019-09-03 LAB — GLUCOSE, CAPILLARY: Glucose-Capillary: 130 mg/dL — ABNORMAL HIGH (ref 70–99)

## 2019-09-03 MED ORDER — FUROSEMIDE 10 MG/ML IJ SOLN
40.0000 mg | Freq: Every day | INTRAMUSCULAR | Status: DC
  Administered 2019-09-04: 40 mg via INTRAVENOUS
  Filled 2019-09-03 (×2): qty 4

## 2019-09-03 NOTE — Progress Notes (Signed)
[facility] Progress Note  Patient: Theodore GuestJames W Puga WUJ:[id number]   PCP: [name], Loyalty W, MD DOB: 01/13/1956   DOA: 08/30/2019   DOS: 09/03/2019   Date of Service: the patient was seen and examined on 09/03/2019  Chief Complaint  Patient presents with  . Shortness of Breath   Brief hospital course: Theodore [name] is a 63 y.o. male with medical history significant for steroid-dependent COPD, chronic hypoxic respiratory failure, coronary artery disease, chronic leg swelling, and history of PE on Xarelto, now presenting to the emergency department for evaluation of shortness of breath.  Patient reports increased exertional dyspnea for approximately 2 days, worsened today with increased productive cough and wheezing, and was unable to recover after becoming acutely dyspneic with mild exertion, prompting him to call EMS.  He received albuterol and 125 mg of IV Solu-Medrol prior to arrival in the ED.  He denies any recent fevers or chills.  He denies any chest pain.  Reports continued adherence with his Xarelto.  Reports some improvement with albuterol.  Currently further plan is continue treating COPD as well as continue diuresis.  Subjective: Continues to have cough shortness of breath as well as swelling in the leg.  No nausea no vomiting.  No fever no chills.  No BM so far.  Passing gas.  Assessment and Plan: 1. COPD exacerbation; acute on chronic hypoxic respiratory failure  - Presents with SOB, wheezing, increased productive cough, and increased supplemental O2 requirements, found to have clear CXR, improved with bronchodilators but still dyspneic at rest  - COVID-19 is negative  - Check sputum culture, start azithromycin, continue Solu-Medrol, increasing Solu-Medrol to 4 times daily.  Continue Flomax.  Schedule duoneb, continue ICS/LABA, use albuterol as needed   Change antibiotic from azithromycin to doxycycline.  2. Hyponatremia   resolved - Sodium is 133 on admission; patient  appears hypervolemic - SLIV, continue diuretics, repeat chemistries in am   3. Hx of PE  - Continue Xarelto   Low yield but will still perform lower extremity Doppler to rule out DVT given significant swelling in the leg  4.  Acute on chronic diastolic CHF. Continue IV Lasix pretension echocardiogram shows preserved EF no acute abnormality otherwise. Will add Unna boots.  Renal function mildly worsening therefore I will reduce the Lasix from twice a day to once a day. -8 L so far.  Diet: Cardiac diet  DVT Prophylaxis: Therapeutic Anticoagulation with Xarelto  Advance goals of care discussion: Full code  Family Communication: no family was present at bedside, at the time of interview.   Disposition:  Discharge to be determined .  Consultants: none Procedures: none  Scheduled Meds: . benzonatate  100 mg Oral TID  . doxycycline  100 mg Oral Q12H  . famotidine  20 mg Oral Daily  . fluticasone  2 spray Each Nare Daily  . [START ON 09/04/2019] furosemide  40 mg Intravenous Daily  . guaiFENesin  1,200 mg Oral BID  . ipratropium-albuterol  3 mL Nebulization TID  . methylPREDNISolone (SOLU-MEDROL) injection  60 mg Intravenous QID  . mometasone-formoterol  2 puff Inhalation BID  . montelukast  10 mg Oral QHS  . rivaroxaban  20 mg Oral Daily  . sodium chloride flush  3 mL Intravenous Q12H   Continuous Infusions: . sodium chloride 10 mL/hr at 09/03/19 1000  . sodium chloride     PRN Meds: sodium chloride, sodium chloride, acetaminophen **OR** acetaminophen, albuterol, albuterol, alum & mag hydroxide-simeth, chlorpheniramine-HYDROcodone, HYDROcodone-acetaminophen, ipratropium, nicotine polacrilex,  ondansetron **OR** ondansetron (ZOFRAN) IV, senna-docusate, sodium chloride, sodium chloride flush Antibiotics: Anti-infectives (From admission, onward)   Start     Dose/Rate Route Frequency Ordered Stop   09/02/19 1000  doxycycline (VIBRA-TABS) tablet 100 mg     100 mg Oral Every 12  hours 09/02/19 0941     08/31/19 0245  azithromycin (ZITHROMAX) 500 mg in sodium chloride 0.9 % 250 mL IVPB  Status:  Discontinued     500 mg 250 mL/hr over 60 Minutes Intravenous Daily at bedtime 08/31/19 0239 09/02/19 0941   08/31/19 0130  cefTRIAXone (ROCEPHIN) 1 g in sodium chloride 0.9 % 100 mL IVPB     1 g 200 mL/hr over 30 Minutes Intravenous  Once 08/31/19 0121 08/31/19 0255       Objective: Physical Exam: Vitals:   09/02/19 2124 09/03/19 0615 09/03/19 0808 09/03/19 1320  BP: 137/83 134/87  (!) 142/78  Pulse: 80 82  81  Resp: 18 20  17   Temp: 98.6 F (37 C) 98.1 F (36.7 C)    TempSrc: Oral Oral    SpO2: 97% 94% 94% 94%  Weight:      Height:        Intake/Output Summary (Last 24 hours) at 09/03/2019 1728 Last data filed at 09/03/2019 1634 Gross per 24 hour  Intake 2119.74 ml  Output 4700 ml  Net -2580.26 ml   Filed Weights   08/30/19 2331  Weight: 104.3 kg   General: alert and oriented to time, place, and person. Appear in mild distress, affect appropriate Eyes: PERRL, Conjunctiva normal ENT: Oral Mucosa Clear, moist  Neck: no JVD, no Abnormal Mass Or lumps Cardiovascular: S1 and S2 Present, no Murmur, peripheral pulses symmetrical Respiratory: good respiratory effort, Bilateral Air entry equal and Decreased, no signs of accessory muscle use, bilateral  Crackles, bilateral  wheezes Abdomen: Bowel Sound present, Soft and no tenderness, no hernia Skin: no rashes  Extremities: bilateral  Pedal edema, no calf tenderness Neurologic: without any new focal findings Gait not checked due to patient safety concerns  Data Reviewed: I have personally reviewed and interpreted daily labs, tele strips, imagings as discussed above. I reviewed all nursing notes, pharmacy notes, vitals, pertinent old records I have discussed plan of care as described above with RN and patient/family.  CBC: Recent Labs  Lab 08/30/19 2339 08/31/19 0014 08/31/19 0321 09/01/19 0309  09/02/19 0407 09/03/19 0321  WBC 12.3*  --  14.8* 16.4* 16.3* 15.3*  NEUTROABS 9.1*  --  13.9* 14.9*  --   --   HGB 14.2 14.3 14.1 12.7* 12.5* 13.6  HCT 43.6 42.0 44.1 38.9* 38.2* 41.5  MCV 95.4  --  96.7 97.0 97.0 97.2  PLT 408*  --  351 357 338 389   Basic Metabolic Panel: Recent Labs  Lab 08/31/19 0014 08/31/19 0321 09/01/19 0309 09/02/19 0407 09/03/19 0321  NA 133* 132* 131* 132* 134*  K 3.7 4.2 4.5 4.4 4.0  CL 98 96* 99 96* 95*  CO2  --  23 23 26 28   GLUCOSE 113* 165* 140* 135* 150*  BUN 16 17 17  24* 28*  CREATININE 0.90 0.97 0.85 0.85 1.14  CALCIUM  --  9.0 8.7* 8.5* 8.7*  MG  --   --   --  2.2 2.3    Liver Function Tests: Recent Labs  Lab 08/31/19 0321 09/01/19 0309  AST 19 21  ALT 17 15  ALKPHOS 54 47  BILITOT 0.6 0.8  PROT 6.7 6.2*  ALBUMIN 3.7  3.3*   No results for input(s): LIPASE, AMYLASE in the last 168 hours. No results for input(s): AMMONIA in the last 168 hours. Coagulation Profile: No results for input(s): INR, PROTIME in the last 168 hours. Cardiac Enzymes: No results for input(s): CKTOTAL, CKMB, CKMBINDEX, TROPONINI in the last 168 hours. BNP (last 3 results) No results for input(s): PROBNP in the last 8760 hours. CBG: Recent Labs  Lab 08/31/19 0805 09/01/19 0752 09/02/19 0745 09/03/19 0753  GLUCAP 168* 142* 124* 130*   Studies: No results found.   Time spent: 35 minutes  Author: Berle Mull, MD [facility] 09/03/2019 5:28 PM  To reach On-call, see care teams to locate the attending and reach out to them via [email or web address]. If 7PM-7AM, please contact night-coverage If you still have difficulty reaching the attending provider, please page the [facility] (Director on Call) for [facility] on amion for assistance.

## 2019-09-03 NOTE — Plan of Care (Signed)
Patient sitting up in bed this morning; pain controlled. No needs expressed at this time. Will continue to monitor.

## 2019-09-04 ENCOUNTER — Encounter (HOSPITAL_COMMUNITY)
Admission: EM | Disposition: A | Discharge status: Skilled Nursing Facility | Payer: Self-pay | Source: Home / Self Care | Attending: Internal Medicine

## 2019-09-04 ENCOUNTER — Inpatient Hospital Stay (HOSPITAL_COMMUNITY): Payer: PPO

## 2019-09-04 ENCOUNTER — Encounter (HOSPITAL_COMMUNITY): Payer: Self-pay | Admitting: Certified Registered"

## 2019-09-04 ENCOUNTER — Inpatient Hospital Stay (HOSPITAL_COMMUNITY): Payer: PPO | Admitting: Certified Registered"

## 2019-09-04 ENCOUNTER — Other Ambulatory Visit: Payer: Self-pay

## 2019-09-04 DIAGNOSIS — J9621 Acute and chronic respiratory failure with hypoxia: Secondary | ICD-10-CM | POA: Diagnosis not present

## 2019-09-04 DIAGNOSIS — I2510 Atherosclerotic heart disease of native coronary artery without angina pectoris: Secondary | ICD-10-CM

## 2019-09-04 DIAGNOSIS — K668 Other specified disorders of peritoneum: Secondary | ICD-10-CM

## 2019-09-04 DIAGNOSIS — Z7952 Long term (current) use of systemic steroids: Secondary | ICD-10-CM

## 2019-09-04 DIAGNOSIS — J441 Chronic obstructive pulmonary disease with (acute) exacerbation: Secondary | ICD-10-CM | POA: Diagnosis not present

## 2019-09-04 HISTORY — PX: LAPAROTOMY: SHX154

## 2019-09-04 LAB — BASIC METABOLIC PANEL
Anion gap: 10 (ref 5–15)
BUN: 29 mg/dL — ABNORMAL HIGH (ref 8–23)
CO2: 29 mmol/L (ref 22–32)
Calcium: 8.6 mg/dL — ABNORMAL LOW (ref 8.9–10.3)
Chloride: 97 mmol/L — ABNORMAL LOW (ref 98–111)
Creatinine, Ser: 1.06 mg/dL (ref 0.61–1.24)
GFR calc Af Amer: >60 mL/min (ref >60)
GFR calc non Af Amer: >60 mL/min (ref >60)
Glucose, Bld: 153 mg/dL — ABNORMAL HIGH (ref 70–99)
Potassium: 3.8 mmol/L (ref 3.5–5.1)
Sodium: 136 mmol/L (ref 135–145)

## 2019-09-04 LAB — CBC
HCT: 42 % (ref 39.0–52.0)
Hemoglobin: 13.6 g/dL (ref 13.0–17.0)
MCH: 31.6 pg (ref 26.0–34.0)
MCHC: 32.4 g/dL (ref 30.0–36.0)
MCV: 97.4 fL (ref 80.0–100.0)
Platelets: 374 10*3/uL (ref 150–400)
RBC: 4.31 MIL/uL (ref 4.22–5.81)
RDW: 13.2 % (ref 11.5–15.5)
WBC: 14.7 10*3/uL — ABNORMAL HIGH (ref 4.0–10.5)
nRBC: 0 % (ref 0.0–0.2)

## 2019-09-04 LAB — GLUCOSE, CAPILLARY: Glucose-Capillary: 240 mg/dL — ABNORMAL HIGH (ref 70–99)

## 2019-09-04 LAB — TYPE AND SCREEN
ABO/RH(D): A POS
Antibody Screen: NEGATIVE

## 2019-09-04 LAB — SURGICAL PCR SCREEN
MRSA, PCR: NEGATIVE
Staphylococcus aureus: NEGATIVE

## 2019-09-04 LAB — MAGNESIUM: Magnesium: 2.1 mg/dL (ref 1.7–2.4)

## 2019-09-04 SURGERY — LAPAROTOMY, EXPLORATORY
Anesthesia: General

## 2019-09-04 MED ORDER — PROPOFOL 500 MG/50ML IV EMUL
INTRAVENOUS | Status: DC | PRN
  Administered 2019-09-04: 25 ug/kg/min via INTRAVENOUS

## 2019-09-04 MED ORDER — ALBUTEROL SULFATE HFA 108 (90 BASE) MCG/ACT IN AERS
INHALATION_SPRAY | RESPIRATORY_TRACT | Status: DC | PRN
  Administered 2019-09-04 (×2): 2 via RESPIRATORY_TRACT

## 2019-09-04 MED ORDER — PROPOFOL 10 MG/ML IV BOLUS
INTRAVENOUS | Status: AC
  Filled 2019-09-04: qty 20

## 2019-09-04 MED ORDER — PROPOFOL 10 MG/ML IV BOLUS
INTRAVENOUS | Status: DC | PRN
  Administered 2019-09-04: 150 mg via INTRAVENOUS

## 2019-09-04 MED ORDER — SODIUM CHLORIDE 0.9 % IV SOLN
INTRAVENOUS | Status: DC | PRN
  Administered 2019-09-04: 25 ug/min via INTRAVENOUS

## 2019-09-04 MED ORDER — CHLORHEXIDINE GLUCONATE CLOTH 2 % EX PADS
6.0000 | MEDICATED_PAD | Freq: Every day | CUTANEOUS | Status: DC
  Administered 2019-09-05 – 2019-09-17 (×11): 6 via TOPICAL

## 2019-09-04 MED ORDER — LIDOCAINE 2% (20 MG/ML) 5 ML SYRINGE
INTRAMUSCULAR | Status: AC
  Filled 2019-09-04: qty 20

## 2019-09-04 MED ORDER — FENTANYL CITRATE (PF) 250 MCG/5ML IJ SOLN
INTRAMUSCULAR | Status: DC | PRN
  Administered 2019-09-04: 50 ug via INTRAVENOUS
  Administered 2019-09-04: 100 ug via INTRAVENOUS
  Administered 2019-09-04: 50 ug via INTRAVENOUS

## 2019-09-04 MED ORDER — FLUCONAZOLE 100MG IVPB
100.0000 mg | INTRAVENOUS | Status: DC
  Administered 2019-09-04: 100 mg via INTRAVENOUS
  Filled 2019-09-04: qty 50

## 2019-09-04 MED ORDER — SODIUM CHLORIDE (PF) 0.9 % IJ SOLN
INTRAMUSCULAR | Status: AC
  Filled 2019-09-04: qty 50

## 2019-09-04 MED ORDER — CHLORHEXIDINE GLUCONATE CLOTH 2 % EX PADS: 6.0000 | MEDICATED_PAD | Freq: Every day | CUTANEOUS | Status: DC

## 2019-09-04 MED ORDER — CHLORHEXIDINE GLUCONATE 0.12% ORAL RINSE (MEDLINE KIT)
15.0000 mL | Freq: Two times a day (BID) | OROMUCOSAL | Status: DC
  Administered 2019-09-05 – 2019-09-06 (×5): 15 mL via OROMUCOSAL

## 2019-09-04 MED ORDER — ORAL CARE MOUTH RINSE
15.0000 mL | OROMUCOSAL | Status: DC
  Administered 2019-09-05 – 2019-09-06 (×15): 15 mL via OROMUCOSAL

## 2019-09-04 MED ORDER — MEPERIDINE HCL 50 MG/ML IJ SOLN: 6.2500 mg | INTRAMUSCULAR | Status: DC | PRN

## 2019-09-04 MED ORDER — EPHEDRINE 5 MG/ML INJ
INTRAVENOUS | Status: AC
  Filled 2019-09-04: qty 10

## 2019-09-04 MED ORDER — SUCCINYLCHOLINE CHLORIDE 200 MG/10ML IV SOSY
PREFILLED_SYRINGE | INTRAVENOUS | Status: DC | PRN
  Administered 2019-09-04: 140 mg via INTRAVENOUS

## 2019-09-04 MED ORDER — FENTANYL CITRATE (PF) 250 MCG/5ML IJ SOLN
INTRAMUSCULAR | Status: AC
  Filled 2019-09-04: qty 5

## 2019-09-04 MED ORDER — PIPERACILLIN-TAZOBACTAM 3.375 G IVPB
3.3750 g | Freq: Three times a day (TID) | INTRAVENOUS | Status: DC
  Administered 2019-09-04 – 2019-09-06 (×5): 3.375 g via INTRAVENOUS
  Filled 2019-09-04 (×5): qty 50

## 2019-09-04 MED ORDER — ROCURONIUM BROMIDE 10 MG/ML (PF) SYRINGE
PREFILLED_SYRINGE | INTRAVENOUS | Status: AC
  Filled 2019-09-04: qty 20

## 2019-09-04 MED ORDER — PHENYLEPHRINE 40 MCG/ML (10ML) SYRINGE FOR IV PUSH (FOR BLOOD PRESSURE SUPPORT)
PREFILLED_SYRINGE | INTRAVENOUS | Status: AC
  Filled 2019-09-04: qty 10

## 2019-09-04 MED ORDER — MIDAZOLAM HCL 2 MG/2ML IJ SOLN
INTRAMUSCULAR | Status: DC | PRN
  Administered 2019-09-04: 2 mg via INTRAVENOUS

## 2019-09-04 MED ORDER — METHYLPREDNISOLONE SODIUM SUCC 40 MG IJ SOLR: 40.0000 mg | Freq: Every day | INTRAMUSCULAR | Status: DC

## 2019-09-04 MED ORDER — SODIUM CHLORIDE 0.9 % IR SOLN
Status: DC | PRN
  Administered 2019-09-04: 2000 mL

## 2019-09-04 MED ORDER — FENTANYL CITRATE (PF) 100 MCG/2ML IJ SOLN
100.0000 ug | Freq: Once | INTRAMUSCULAR | Status: AC
  Administered 2019-09-04: 100 ug via INTRAVENOUS

## 2019-09-04 MED ORDER — ROCURONIUM BROMIDE 10 MG/ML (PF) SYRINGE
PREFILLED_SYRINGE | INTRAVENOUS | Status: DC | PRN
  Administered 2019-09-04: 20 mg via INTRAVENOUS
  Administered 2019-09-04: 30 mg via INTRAVENOUS
  Administered 2019-09-04: 10 mg via INTRAVENOUS

## 2019-09-04 MED ORDER — MUPIROCIN 2 % EX OINT: 1.0000 | TOPICAL_OINTMENT | Freq: Two times a day (BID) | CUTANEOUS | Status: DC

## 2019-09-04 MED ORDER — FENTANYL CITRATE (PF) 100 MCG/2ML IJ SOLN
100.0000 ug | INTRAMUSCULAR | Status: DC | PRN
  Administered 2019-09-05: 100 ug via INTRAVENOUS
  Filled 2019-09-04: qty 2

## 2019-09-04 MED ORDER — PROPOFOL 1000 MG/100ML IV EMUL
5.0000 ug/kg/min | INTRAVENOUS | Status: DC
  Administered 2019-09-04: 25 ug/kg/min via INTRAVENOUS
  Administered 2019-09-05: 45 ug/kg/min via INTRAVENOUS
  Administered 2019-09-05: 35 ug/kg/min via INTRAVENOUS
  Administered 2019-09-05: 45 ug/kg/min via INTRAVENOUS
  Administered 2019-09-06: 40 ug/kg/min via INTRAVENOUS
  Filled 2019-09-04 (×9): qty 100

## 2019-09-04 MED ORDER — PROMETHAZINE HCL 25 MG/ML IJ SOLN: 6.2500 mg | INTRAMUSCULAR | Status: DC | PRN

## 2019-09-04 MED ORDER — IOHEXOL 300 MG/ML  SOLN
100.0000 mL | Freq: Once | INTRAMUSCULAR | Status: AC | PRN
  Administered 2019-09-04: 100 mL via INTRAVENOUS

## 2019-09-04 MED ORDER — FENTANYL CITRATE (PF) 100 MCG/2ML IJ SOLN
INTRAMUSCULAR | Status: AC
  Filled 2019-09-04: qty 2

## 2019-09-04 MED ORDER — HYDROMORPHONE HCL 1 MG/ML IJ SOLN: 0.2500 mg | INTRAMUSCULAR | Status: DC | PRN

## 2019-09-04 MED ORDER — MIDAZOLAM HCL 2 MG/2ML IJ SOLN
INTRAMUSCULAR | Status: AC
  Filled 2019-09-04: qty 2

## 2019-09-04 MED ORDER — SUCCINYLCHOLINE CHLORIDE 200 MG/10ML IV SOSY
PREFILLED_SYRINGE | INTRAVENOUS | Status: AC
  Filled 2019-09-04: qty 20

## 2019-09-04 MED ORDER — LACTATED RINGERS IV SOLN
INTRAVENOUS | Status: DC | PRN
  Administered 2019-09-04: 21:00:00 via INTRAVENOUS

## 2019-09-04 MED ORDER — LIDOCAINE 2% (20 MG/ML) 5 ML SYRINGE
INTRAMUSCULAR | Status: DC | PRN
  Administered 2019-09-04: 100 mg via INTRAVENOUS

## 2019-09-04 SURGICAL SUPPLY — 43 items
APPLICATOR COTTON TIP 6 STRL (MISCELLANEOUS) ×1 IMPLANT
APPLICATOR COTTON TIP 6IN STRL (MISCELLANEOUS) ×3
BLADE EXTENDED COATED 6.5IN (ELECTRODE) ×3 IMPLANT
BLADE HEX COATED 2.75 (ELECTRODE) ×3 IMPLANT
COVER MAYO STAND STRL (DRAPES) ×3 IMPLANT
COVER SURGICAL LIGHT HANDLE (MISCELLANEOUS) ×3 IMPLANT
COVER WAND RF STERILE (DRAPES) ×3 IMPLANT
DRAPE LAPAROSCOPIC ABDOMINAL (DRAPES) ×3 IMPLANT
DRAPE WARM FLUID 44X44 (DRAPES) ×3 IMPLANT
DRSG OPSITE POSTOP 4X8 (GAUZE/BANDAGES/DRESSINGS) ×3 IMPLANT
ELECT REM PT RETURN 15FT ADLT (MISCELLANEOUS) ×3 IMPLANT
GAUZE SPONGE 4X4 12PLY STRL (GAUZE/BANDAGES/DRESSINGS) ×3 IMPLANT
GLOVE BIOGEL PI IND STRL 7.0 (GLOVE) ×1 IMPLANT
GLOVE BIOGEL PI INDICATOR 7.0 (GLOVE) ×2
GLOVE SURG SYN 7.5  E (GLOVE) ×2
GLOVE SURG SYN 7.5 E (GLOVE) ×1 IMPLANT
GOWN SPEC L4 XLG W/TWL (GOWN DISPOSABLE) ×3 IMPLANT
GOWN STRL REUS W/ TWL XL LVL3 (GOWN DISPOSABLE) ×3 IMPLANT
GOWN STRL REUS W/TWL LRG LVL3 (GOWN DISPOSABLE) ×3 IMPLANT
GOWN STRL REUS W/TWL XL LVL3 (GOWN DISPOSABLE) ×6
HANDLE SUCTION POOLE (INSTRUMENTS) ×1 IMPLANT
KIT BASIN OR (CUSTOM PROCEDURE TRAY) ×3 IMPLANT
KIT TURNOVER KIT A (KITS) IMPLANT
NS IRRIG 1000ML POUR BTL (IV SOLUTION) ×6 IMPLANT
PACK GENERAL/GYN (CUSTOM PROCEDURE TRAY) ×3 IMPLANT
SPONGE LAP 18X18 RF (DISPOSABLE) IMPLANT
STAPLER VISISTAT 35W (STAPLE) ×3 IMPLANT
SUCTION POOLE HANDLE (INSTRUMENTS) ×3
SUT NOVA NAB DX-16 0-1 5-0 T12 (SUTURE) ×9 IMPLANT
SUT PDS AB 1 CTX 36 (SUTURE) IMPLANT
SUT SILK 2 0 (SUTURE) ×2
SUT SILK 2 0 SH CR/8 (SUTURE) ×3 IMPLANT
SUT SILK 2-0 18XBRD TIE 12 (SUTURE) ×1 IMPLANT
SUT SILK 3 0 (SUTURE) ×2
SUT SILK 3 0 SH CR/8 (SUTURE) ×3 IMPLANT
SUT SILK 3-0 18XBRD TIE 12 (SUTURE) ×1 IMPLANT
SUT VIC AB 2-0 SH 18 (SUTURE) ×3 IMPLANT
SUT VICRYL 2 0 18  UND BR (SUTURE)
SUT VICRYL 2 0 18 UND BR (SUTURE) IMPLANT
TOWEL OR 17X26 10 PK STRL BLUE (TOWEL DISPOSABLE) ×3 IMPLANT
TRAY FOLEY MTR SLVR 16FR STAT (SET/KITS/TRAYS/PACK) ×3 IMPLANT
WATER STERILE IRR 1000ML POUR (IV SOLUTION) ×3 IMPLANT
YANKAUER SUCT BULB TIP NO VENT (SUCTIONS) IMPLANT

## 2019-09-04 NOTE — Progress Notes (Signed)
ABG drew on PRVC 690, 14, Peep 5 and 60%  Results for Theodore [name], Theodore [name] (MRN [id number]) as of 09/04/2019 23:33  Ref. Range 09/04/2019 22:55  FIO2 Unknown 60.10  pH, Arterial Latest Ref Range: 7.350 - 7.450  7.497 (H)  pCO2 arterial Latest Ref Range: 32.0 - 48.0 mmHg 35.6  pO2, Arterial Latest Ref Range: 83.0 - 108.0 mmHg 148 (H)  Acid-Base Excess Latest Ref Range: 0.0 - 2.0 mmol/L PENDING  Acid-base deficit Latest Ref Range: 0.0 - 2.0 mmol/L PENDING  Bicarbonate Latest Ref Range: 20.0 - 28.0 mmol/L 27.3  O2 Saturation Latest Units: % PENDING  Patient temperature Unknown 98.6

## 2019-09-04 NOTE — Progress Notes (Signed)
Please see Dr. Clyda Greener note earlier this afternoon.  Patient with multiple medical problems - COPD History of PE On Xarelto History of CHF  Now with pnuemoperitoneum on KUB. CT scan tonight shows pneumotosis of small bowel suggestive of ischemia - discussed with Dr. Reesa Chew  Discussed with patient - will need to go to OR - but very high risk for short term complication - bleeding, infection - and long term complication - prolonged intubation, non wound healing.  Talked to sister, Thayer Headings Rickets, about findings and plan to the OR.  Discussed with Forrest Moron, covering for Dr. Donnel Saxon  Alphonsa Overall, MD, [facility] Office phone:  [phone]

## 2019-09-04 NOTE — Consult Note (Signed)
NAME:  Theodore [name], MRN:  [id number], DOB:  [birth year], LOS: 4 ADMISSION DATE:  08/30/2019, CONSULTATION DATE:  10/26 REFERRING MD: Alphonsa Overall, Berle Mull , CHIEF COMPLAINT:  Dyspnea, abdominal pain   Brief History   63 year old man with COPD on oxygen at home, CAD, PE history, here with acute dyspnea thought 2/2 copd exacerbation.   Developed abdominal pain, found to have pneumoperitoneum  POD 0 for exploratory laparotomy for possible pneumoperitoneum and ischemic small bowel - no underlying cause for pneumoperitoneum found.  History of present illness    Admitted 10/21 with COPD exacerbation - 1 day of shortness of breath, wheezing, productive cough.  Developed abdominal pain on 10/26 am, imaging showing free air and pneumointestinalis.  Ex lap done 10/26 unremarkable, remained intubated following surgery.     On 2-3 L [place] during hospitalization.    Past Medical History  COPD, 2L home O2 (at night?) GERD CAD, s/p NSTEMI  PE history, remains on Xarelto Allergic rhinitis Chronic leg swelling   Significant Hospital Events   10/26: ex lap - "he had no evidence of ischemia, perforation, or abnormal changes of his small bowel.  He did had a bolus of food with undigested food particles that bulged in the small bowel, and I suspect, that this is what gave the impression of pneumatosis in the small bowel."  10/22 desat on ambulation, rapid response called. Nebs, lasix --> improved.  Lasix BID 10/24 Changed azithro to doxy on 10/25 Increased systemic steroids on 1025 due to worsening dyspnea, increased productive cough.  Reduced lasix back to daily on 10/25 due to AKI.   10/26:  AXR with free air, concern for perforated ulcer or colon from diverticulitis, with possibility of steroids masking symptoms.  CT done:  10/26 ex lap as described above.   Given COPD exacerbation, Increased WOB preop, kept pt intubated after surgery.   Afib, HR 90s after surgery.   BNP 44 on  admission Consults:  Surgery   Procedures:    Significant Diagnostic Tests:    Echo: Left ventricular ejection fraction, by visual estimation, is 65 to 70%. The left ventricle has hyperdynamic function. There is no left ventricular hypertrophy. Right Ventricle: The right ventricular size is mildly enlarged. No increase in right ventricular wall thickness. Global RV systolic function is has normal systolic function. Left Atrium: Left atrial size was normal in size. Right Atrium: Right atrial size was normal in size   AXR IMPRESSION: 1. Moderate volume pneumoperitoneum concerning for hollow viscus perforation. 2. Dilated loops of small bowel concerning for small bowel obstruction. 3. Right upper lobe airspace opacity. This could represent atelectasis or developing infiltrate. A pulmonary nodule is not entirely excluded. A 4-6 week follow-up two-view chest x-ray is recommended to confirm resolution of this finding. 4. Large amount of stool in the right hemicolon.  CT abd: Large volume of abdominopelvic pneumoperitoneum with dilated abnormal appearing small bowel in the left abdomen with fecalization and pneumatosis suspicious for ischemic small bowel. No significant free fluid, hemorrhage or abscess.  Micro Data:  No bl cx data.   Antimicrobials:  10/21 ctx x 1 10/21-10/23 Azithromycin 10/24-  Doxycycline 10/26 - 10/26  fluconazole 10/26-  zosyn   Interim history/subjective:    Objective   Blood pressure (!) 178/102, pulse 88, temperature 98.4 F (36.9 C), temperature source Oral, resp. rate 18, height 6\' 4"  (1.93 m), weight 104.3 kg, SpO2 100 %.    Vent Mode: PRVC FiO2 (%):  [60 %]  60 % Set Rate:  [14 bmp] 14 bmp Vt Set:  [690 mL] 690 mL PEEP:  [5 cmH20] 5 cmH20 Plateau Pressure:  [25 cmH20] 25 cmH20   Intake/Output Summary (Last 24 hours) at 09/04/2019 2226 Last data filed at 09/04/2019 2151 Gross per 24 hour  Intake 599.54 ml  Output 2450 ml  Net -1850.46  ml   Filed Weights   08/30/19 2331  Weight: 104.3 kg    Examination: General:  breathing over vent, following simple commands, slight grimace  HENT: NCAT, intubated  Lungs: slightly reduced air movement, minimal inspiratory and expiratory scattered occasional wheezing sounds.  Cardiovascular: RRR, afib on monitor Abdomen: midline incision, dressed, NBS, no apparent tenderness.  Appears mildly distended.  Extremities: B non pitting edema Neuro: following simple commands, sedated   Resolved Hospital Problem list     Assessment & Plan:  S/p ex lap for pneumoperitoneum  Hypoxemia ( on 2L prior to intubation for surgery):  Thought 2/2 COPD exacerbation.  Has been getting lasix daily or bid and has been net negative daily.  Hold lasix now.  CXR ordered.  Solumedrol decreased to 40 BID yesterday.   Antibiotics changed to fluconazole, zosyn today.  Had received azithro, switched to doxy on 10/24, now stopped.  Need to sedate adequately overnight, treat post op pain - fentanyl prn and propofol.  Probably can extubate in AM after SBT.   COPD:  Dulera 2p BID HCO3 was 30 on admission, today is 29.  7.49/35/148, currently breathing over set rate and volume.  On solumedrol BID Dulera BID  Albuterol prn, ipratropium prn  Increasing Cr: cr improving  PE: xarelto held for surgery   New afib - rate control as needed.   Best practice:  Diet: npo Pain/Anxiety/Delirium protocol (if indicated):  VAP protocol (if indicated): yes DVT prophylaxis: held, restart xarelto when cleared by surgery GI prophylaxis:  Glucose control:  Mobility: bedrest Code Status: full Family Communication:  Disposition: ICU  Labs   CBC: Recent Labs  Lab 08/30/19 2339  08/31/19 0321 09/01/19 0309 09/02/19 0407 09/03/19 0321 09/04/19 0307  WBC 12.3*  --  14.8* 16.4* 16.3* 15.3* 14.7*  NEUTROABS 9.1*  --  13.9* 14.9*  --   --   --   HGB 14.2   < > 14.1 12.7* 12.5* 13.6 13.6  HCT 43.6   < > 44.1  38.9* 38.2* 41.5 42.0  MCV 95.4  --  96.7 97.0 97.0 97.2 97.4  PLT 408*  --  351 357 338 389 374   < > = values in this interval not displayed.    Basic Metabolic Panel: Recent Labs  Lab 08/31/19 0321 09/01/19 0309 09/02/19 0407 09/03/19 0321 09/04/19 0307  NA 132* 131* 132* 134* 136  K 4.2 4.5 4.4 4.0 3.8  CL 96* 99 96* 95* 97*  CO2 23 23 26 28 29   GLUCOSE 165* 140* 135* 150* 153*  BUN 17 17 24* 28* 29*  CREATININE 0.97 0.85 0.85 1.14 1.06  CALCIUM 9.0 8.7* 8.5* 8.7* 8.6*  MG  --   --  2.2 2.3 2.1   GFR: Estimated Creatinine Clearance: 92.2 mL/min (by C-G formula based on SCr of 1.06 mg/dL). Recent Labs  Lab 09/01/19 0309 09/02/19 0407 09/03/19 0321 09/04/19 0307  WBC 16.4* 16.3* 15.3* 14.7*    Liver Function Tests: Recent Labs  Lab 08/31/19 0321 09/01/19 0309  AST 19 21  ALT 17 15  ALKPHOS 54 47  BILITOT 0.6 0.8  PROT 6.7 6.2*  ALBUMIN 3.7 3.3*   No results for input(s): LIPASE, AMYLASE in the last 168 hours. No results for input(s): AMMONIA in the last 168 hours.  ABG    Component Value Date/Time   PHART 7.382 01/10/2014 1244   PCO2ART 34.7 (L) 01/10/2014 1244   PO2ART 81.9 01/10/2014 1244   HCO3 20.1 01/10/2014 1244   TCO2 25 08/31/2019 0014   ACIDBASEDEF 3.7 (H) 01/10/2014 1244   O2SAT 95.6 01/10/2014 1244     Coagulation Profile: No results for input(s): INR, PROTIME in the last 168 hours.  Cardiac Enzymes: No results for input(s): CKTOTAL, CKMB, CKMBINDEX, TROPONINI in the last 168 hours.  HbA1C: Hemoglobin A1C  Date/Time Value Ref Range Status  07/27/2017 5.2  Final   Hgb A1c MFr Bld  Date/Time Value Ref Range Status  08/18/2018 11:44 AM 5.6 4.6 - 6.5 % Final    Comment:    Glycemic Control Guidelines for People with Diabetes:Non Diabetic:  <6%Goal of Therapy: <7%Additional Action Suggested:  >8%   04/30/2016 09:09 AM 5.8 4.6 - 6.5 % Final    Comment:    Glycemic Control Guidelines for People with Diabetes:Non Diabetic:  <6%Goal  of Therapy: <7%Additional Action Suggested:  >8%     CBG: Recent Labs  Lab 08/31/19 0805 09/01/19 0752 09/02/19 0745 09/03/19 0753 09/04/19 0953  GLUCAP 168* 142* 124* 130* 240*    Review of Systems:   Unable, sedated  Past Medical History  He,  has a past medical history of Anxiety, Arthritis, Asthma, Chronic bronchitis (HCC), Cluster headache, COPD (chronic obstructive pulmonary disease) (HCC), GERD (gastroesophageal reflux disease), NSTEMI (non-ST elevated myocardial infarction) (HCC) (01/2014), On home oxygen therapy, Pneumonia (06/2013; 01/2014), Pulmonary embolism (HCC) (01/23/2014), and Seasonal allergies.   Surgical History    Past Surgical History:  Procedure Laterality Date  . COLONOSCOPY WITH PROPOFOL N/A 10/20/2016   Procedure: COLONOSCOPY WITH PROPOFOL;  Surgeon: Sherrilyn Rist, MD;  Location: [facility];  Service: Gastroenterology;  Laterality: N/A;  . NASAL SEPTOPLASTY W/ TURBINOPLASTY Left 1983  . TOTAL HIP ARTHROPLASTY Left 07/16/2017   Procedure: TOTAL HIP ARTHROPLASTY ANTERIOR APPROACH;  Surgeon: Tarry Kos, MD;  Location: [facility] OR;  Service: Orthopedics;  Laterality: Left;  . TURBINATE REDUCTION Bilateral ~ 1983     Social History   reports that he quit smoking about 7 years ago. His smoking use included cigarettes. He has a 70.00 pack-year smoking history. He has never used smokeless tobacco. He reports previous alcohol use. He reports that he does not use drugs.   Family History   His family history includes Asthma in his father and sister; Colon cancer in his cousin; Emphysema in his sister; Heart disease in his father and sister; High blood pressure in his mother; Leukemia in his father.   Allergies Allergies  Allergen Reactions  . Naproxen Sodium Itching     Home Medications  Prior to Admission medications   Medication Sig Start Date End Date Taking? Authorizing Provider  albuterol (PROVENTIL) (2.5 MG/3ML) 0.083% nebulizer solution Take 3 mLs  (2.5 mg total) by nebulization every 6 (six) hours as needed for wheezing or shortness of breath. 12/02/18  Yes McQuaid, Brooke Pace, MD  budesonide-formoterol (SYMBICORT) 160-4.5 MCG/ACT inhaler INHALE 2 PUFFS FIRST THING IN THE MORNING AND ANOTHER 2 PUFFS ABOUT 12 HOURS LATER Patient taking differently: Inhale 2 puffs into the lungs See admin instructions. INHALE 2 PUFFS FIRST THING IN THE MORNING AND ANOTHER 2 PUFFS ABOUT 12 HOURS LATER 04/12/19  Yes Jonny Ruiz,  Len BlalockJames W, MD  fluticasone (FLONASE) 50 MCG/ACT nasal spray Place 1 spray into both nostrils daily.   Yes [provider]  guaiFENesin (MUCINEX) 600 MG 12 hr tablet Take 2 tablets (1,200 mg total) by mouth 2 (two) times daily. 07/05/19  Yes Glenford BayleyWalsh, Elizabeth W, NP  montelukast (SINGULAIR) 10 MG tablet TAKE 1 TABLET(10 MG) BY MOUTH AT BEDTIME Patient taking differently: Take 10 mg by mouth at bedtime.  06/26/19  Yes Glenford BayleyWalsh, Elizabeth W, NP  Multiple Vitamin (MULTI VITAMIN PO) Take 1 tablet by mouth daily.   Yes [provider]  predniSONE (DELTASONE) 10 MG tablet TAKE 1 TABLET BY MOUTH DAILY Patient taking differently: Take 10 mg by mouth daily with breakfast.  08/02/19  Yes McQuaid, Brooke Paceouglas B, MD  PROAIR HFA 108 (90 Base) MCG/ACT inhaler INHALE 2 PUFFS BY MOUTH EVERY 6 HOURS AS NEEDED FOR WHEEZING OR SHORTNESS OF BREATH Patient taking differently: Inhale 2 puffs into the lungs every 6 (six) hours as needed for wheezing or shortness of breath.  12/02/18  Yes McQuaid, Brooke Paceouglas B, MD  rivaroxaban (XARELTO) 20 MG TABS tablet TAKE 1 TABLET(20 MG) BY MOUTH EVERY EVENING Patient taking differently: Take 20 mg by mouth daily.  04/12/19  Yes [name], Kaylan W, MD  sodium chloride (OCEAN) 0.65 % SOLN nasal spray Place 1 spray into both nostrils 4 (four) times daily as needed for congestion.   Yes [provider]  tiotropium (SPIRIVA HANDIHALER) 18 MCG inhalation capsule INHALE THE CONTENTS OF 1 CAPSULE VIA INHALATION DEVICE EVERY DAY Patient taking  differently: Place 18 mcg into inhaler and inhale daily.  05/29/19  Yes Lupita LeashMcQuaid, Douglas B, MD  triamterene-hydrochlorothiazide (MAXZIDE-25) 37.5-25 MG tablet Take 1 tablet by mouth daily. 04/12/19  Yes [name], Myer W, MD  OXYGEN Inhale 2 L/min into the lungs as needed. For sats less than 90%    [provider]     Critical care time: 60 minutes

## 2019-09-04 NOTE — Progress Notes (Signed)
Physical Therapy Treatment Patient Details Name: Theodore [name] MRN: [id number] DOB: September 07, 1956 Today's Date: 09/04/2019    History of Present Illness 63 y.o. male with medical history significant for steroid-dependent COPD, chronic hypoxic respiratory failure, coronary artery disease, chronic leg swelling, and history of PE on Xarelto, now presenting to the emergency department for evaluation of shortness of breath. covid negative    PT Comments    SpO2 85% on RA, dyspnea 4/4 during ambulation.    Follow Up Recommendations  Home health PT     Equipment Recommendations  None recommended by PT    Recommendations for Other Services       Precautions / Restrictions Precautions Precautions: Fall Precaution Comments: baseline O2 dependent at night only--2L; monitor O2 Restrictions Weight Bearing Restrictions: No    Mobility  Bed Mobility Overal bed mobility: Modified Independent                Transfers Overall transfer level: Needs assistance Equipment used: Rolling walker (2 wheeled) Transfers: Sit to/from Stand Sit to Stand: Supervision Stand pivot transfers: From elevated surface          Ambulation/Gait Ambulation/Gait assistance: Min guard Gait Distance (Feet): 75 Feet Assistive device: Rolling walker (2 wheeled) Gait Pattern/deviations: Step-through pattern;Decreased stride length     General Gait Details: SpO2: 85% on RA during ambulation, dyspnea 4/4. Fatigues easily with activity.   Stairs             Wheelchair Mobility    Modified Rankin (Stroke Patients Only)       Balance Overall balance assessment: Mild deficits observed, not formally tested                                          Cognition Arousal/Alertness: Awake/alert Behavior During Therapy: WFL for tasks assessed/performed Overall Cognitive Status: Within Functional Limits for tasks assessed                                 General  Comments: mildly anxious at times      Exercises      General Comments        Pertinent Vitals/Pain Pain Assessment: No/denies pain    Home Living                      Prior Function            PT Goals (current goals can now be found in the care plan section) Progress towards PT goals: Progressing toward goals    Frequency    Min 3X/week      PT Plan Current plan remains appropriate    Co-evaluation              AM-PAC PT "6 Clicks" Mobility   Outcome Measure  Help needed turning from your back to your side while in a flat bed without using bedrails?: None Help needed moving from lying on your back to sitting on the side of a flat bed without using bedrails?: None Help needed moving to and from a bed to a chair (including a wheelchair)?: A Little Help needed standing up from a chair using your arms (e.g., wheelchair or bedside chair)?: A Little Help needed to walk in hospital room?: A Little Help needed climbing 3-5 steps with a railing? : A  Little 6 Click Score: 20    End of Session   Activity Tolerance: Patient limited by fatigue Patient left: in bed;with call bell/phone within reach   PT Visit Diagnosis: Difficulty in walking, not elsewhere classified (R26.2)     Time: 3754-3606 PT Time Calculation (min) (ACUTE ONLY): 19 min  Charges:  $Gait Training: 8-22 mins                       Weston Anna, [place] Pager: [phone] Office: [phone]

## 2019-09-04 NOTE — Transfer of Care (Signed)
Immediate Anesthesia Transfer of Care Note  Patient: Theodore [name] [facility]  Procedure(s) Performed: EXPLORATORY LAPAROTOMY (N/A )  Patient Location: ICU  Anesthesia Type:General  Level of Consciousness: sedated, unresponsive and Patient remains intubated per anesthesia plan  Airway & Oxygen Therapy: Patient remains intubated per anesthesia plan and Patient placed on Ventilator (see vital sign flow sheet for setting)  Post-op Assessment: Report given to RN and Post -op Vital signs reviewed and stable  Post vital signs: Reviewed and stable  Last Vitals:  Vitals Value Taken Time  BP 122/87 09/04/19 2221  Temp    Pulse 88 09/04/19 2226  Resp 19 09/04/19 2226  SpO2 99 % 09/04/19 2226  Vitals shown include unvalidated device data.  Last Pain:  Vitals:   09/04/19 1948  TempSrc: Oral  PainSc:          Complications: No apparent anesthesia complications

## 2019-09-04 NOTE — Care Management Important Message (Signed)
Important Message  Patient Details IM Letter given to [name] RN to present to the Patient Name: Theodore [name] MRN: [id number] Date of Birth: Jun 05, 1956   Medicare Important Message Given:  Yes     Kerin Salen 09/04/2019, 9:35 AM

## 2019-09-04 NOTE — Anesthesia Procedure Notes (Addendum)
Procedure Name: Intubation Date/Time: 09/04/2019 8:47 PM Performed by: Cynda Familia, CRNA Pre-anesthesia Checklist: Patient identified, Emergency Drugs available, Suction available and Patient being monitored Patient Re-evaluated:Patient Re-evaluated prior to induction Oxygen Delivery Method: Circle System Utilized Preoxygenation: Pre-oxygenation with 100% oxygen Induction Type: IV induction, Rapid sequence and Cricoid Pressure applied Ventilation: Mask ventilation without difficulty Laryngoscope Size: Miller and 2 Grade View: Grade I Tube type: Oral Number of attempts: 1 Airway Equipment and Method: Stylet Placement Confirmation: ETT inserted through vocal cords under direct vision,  positive ETCO2 and breath sounds checked- equal and bilateral Secured at: 24 cm Tube secured with: Tape Dental Injury: Teeth and Oropharynx as per pre-operative assessment  Comments: Smooth IV induction  [place]-- intubation AM CRNA atraumatic-- teeth and mouth as preop-- very poor dentition-- all teeth chipped as per pt reported in preop-- bilat BS germeroth

## 2019-09-04 NOTE — Op Note (Addendum)
09/04/2019  10:13 PM  PATIENT:  Theodore [name], 63 y.o., male, MRN: [id number]  PREOP DIAGNOSIS:  Pneumoperitoneum  POSTOP DIAGNOSIS:   Pneumoperitoneum, negative abdominal exploration  PROCEDURE:   Procedure(s):  EXPLORATORY LAPAROTOMY  SURGEON:   Alphonsa Overall, M.D.  ASSISTANT:   P. Marlou Starks, M.D.  ANESTHESIA:   general  Anesthesiologist: Nolon Nations, MD CRNA: Cynda Familia, CRNA  General  EBL:  minimal  ml  BLOOD ADMINISTERED: none  DRAINS: none   LOCAL MEDICATIONS USED:   None  SPECIMEN:   None  COUNTS CORRECT:  YES  INDICATIONS FOR PROCEDURE:  [name] is a 64 y.o. (DOB: 1956/01/09) white male whose primary care physician is Biagio Borg, MD and comes for pneumoperitoneum and possible ischemic small bowel.   The indications and risks of the surgery were explained to the patient.  The risks include, but are not limited to, infection, bleeding, and nerve injury.  PROCEDURE: The patient was taken to room #1 at [facility].  He had a Foley catheter in place.  He had an NG tube placed.  His abdomen is prepped with ChloraPrep.  A timeout was held and the surgical checklist run.  I made a midline incision into the abdomen.  I then explored his abdomen.  First, I ran the small bowel from the ligament of Treitz to the terminal ileum.  I did this 3 times.   He had no evidence of ischemia, perforation, or abnormal changes of his small bowel.  He did had a bolus of food with undigested food particles that bulged in the small bowel, and I suspect, that this is what gave the impression of pneumatosis in the small bowel.  I visualized the stomach and duodenal sweep and saw no evidence of fluid or perforation in this area.  I could see the cecum, the right colon, transverse colon, the sigmoid colon and there was no evidence of perforation or injury to the colon.  He had no free fluid in abdomen.  He had nothing suspicious of infection in the  abdomen.  This was a negative laparotomy for an explanation for the pneumoperitoneum.  And there was no evidence of obvious bowel injury.  I irrigated the abdomen with 1,000 cc of fluid.  The abdomen was closed with interrupted #1 Novafil sutures.  The skin was closed with skin staples.  The patient tolerated the procedure well and was transferred to the ICU intubated.  I have discussed his case with Dr. Corinna Lines of [place].  Alphonsa Overall, MD, [facility] Surgery Office phone:  [phone]

## 2019-09-04 NOTE — Anesthesia Preprocedure Evaluation (Addendum)
Anesthesia Evaluation  Patient identified by MRN, date of birth, ID band Patient awake    Reviewed: Allergy & Precautions, NPO status , Patient's Chart, lab work & pertinent test results  Airway Mallampati: II  TM Distance: >3 FB Neck ROM: Full    Dental  (+) Dental Advisory Given, Poor Dentition, Chipped   Pulmonary asthma , pneumonia, COPD,  COPD inhaler, former smoker, PE Off home oxygen for over one month Sees pulmonologist     Pulmonary exam normal breath sounds clear to auscultation       Cardiovascular hypertension, Pt. on medications + CAD, + Past MI and + DOE  Normal cardiovascular exam Rhythm:Regular Rate:Normal  ECHO 08/31/2019 1. Left ventricular ejection fraction, by visual estimation, is 65 to 70%. The left ventricle has hyperdynamic function. There is no left ventricular hypertrophy.  2. Global right ventricle has normal systolic function.The right ventricular size is mildly enlarged. No increase in right ventricular wall thickness.  3. Left atrial size was normal.  4. Right atrial size was normal.  5. The aortic valve was not well visualized Aortic valve regurgitation was not assessed by color flow Doppler.  6. The mitral valve was not well visualized. No evidence of mitral valve regurgitation.  7. The pulmonic valve was not well visualized. Pulmonic valve regurgitation is not visualized by color flow Doppler.  8. The tricuspid valve is not well visualized. Tricuspid valve regurgitation was not assessed by color flow Doppler.  9. Presence of pericardial fat pad or less likely a loculated pericardial effuion noted over the anterior and apical RV walls.   Neuro/Psych  Headaches, negative psych ROS   GI/Hepatic Neg liver ROS, GERD  Controlled,  Endo/Other    Renal/GU negative Renal ROS     Musculoskeletal  (+) Arthritis ,   Abdominal   Peds  Hematology negative hematology ROS (+)   Anesthesia Other  Findings   Reproductive/Obstetrics                            Anesthesia Physical  Anesthesia Plan  ASA: III and emergent  Anesthesia Plan: General   Post-op Pain Management:    Induction: Intravenous  PONV Risk Score and Plan: 2 and Ondansetron, Dexamethasone, Propofol infusion, Treatment may vary due to age or medical condition and Midazolam  Airway Management Planned: Oral ETT  Additional Equipment:   Intra-op Plan:   Post-operative Plan: Post-operative intubation/ventilation  Informed Consent: I have reviewed the patients History and Physical, chart, labs and discussed the procedure including the risks, benefits and alternatives for the proposed anesthesia with the patient or authorized representative who has indicated his/her understanding and acceptance.     Dental advisory given  Plan Discussed with: CRNA  Anesthesia Plan Comments: (2 x piv, +/- aline. Given COPD exacerbation and increased WOB in preop, Dr. Lucia Gaskins and I discussed Mr. Dittmar pulmonary status and the decision was made to keep patient intubated after surgery. )       Anesthesia Quick Evaluation

## 2019-09-04 NOTE — Progress Notes (Addendum)
[facility] Progress Note  Patient: Theodore [name] ZOX:[id number]   PCP: Theodore Levins, MD DOB: Mar 17, 1956   DOA: 08/30/2019   DOS: 09/04/2019   Date of Service: the patient was seen and examined on 09/04/2019  Chief Complaint  Patient presents with  . Shortness of Breath   Brief hospital course: LEQUAN [name] is a 63 y.o. male with medical history significant for steroid-dependent COPD, chronic hypoxic respiratory failure, coronary artery disease, chronic leg swelling, and history of PE on Xarelto, now presenting to the emergency department for evaluation of shortness of breath.  Patient reports increased exertional dyspnea for approximately 2 days, worsened today with increased productive cough and wheezing, and was unable to recover after becoming acutely dyspneic with mild exertion, prompting him to call EMS.  He received albuterol and 125 mg of IV Solu-Medrol prior to arrival in the ED.  He denies any recent fevers or chills.  He denies any chest pain.  Reports continued adherence with his Xarelto.  Reports some improvement with albuterol.  Currently further plan is follow up on General surgery recommendation.   Subjective: Continues to have cough shortness of breath as well as swelling in the leg.  Some nausea no vomiting. Passing gas and No BM so far.  Assessment and Plan: 1. COPD exacerbation; acute on chronic hypoxic respiratory failure  - Presents with SOB, wheezing, increased productive cough, and increased supplemental O2 requirements, found to have clear CXR, improved with bronchodilators but still dyspneic at rest  - COVID-19 is negative  - Check sputum culture, start azithromycin, continue Solu-Medrol, increasing Solu-Medrol to 4 times daily.  Continue Flomax.  Schedule duoneb, continue ICS/LABA, use albuterol as needed   Change antibiotic from azithromycin to doxycycline.  2. Hyponatremia   resolved - Sodium is 133 on admission; patient appears hypervolemic -  SLIV, continue diuretics, repeat chemistries in am   3. Hx of PE  - Continue Xarelto   Low yield but will still perform lower extremity Doppler to rule out DVT given significant swelling in the leg  4.  Acute on chronic diastolic CHF. Continue IV Lasix pretension echocardiogram shows preserved EF no acute abnormality otherwise. Will add Unna boots.  Renal function mildly worsening therefore I will reduce the Lasix from twice a day to once a day. -8 L so far.  5. Bowel perforation  Keep NPO Zosyn plus fluconazole General surgery Dr. Marland Kitchengross consulted.  Stop lasix and change everything to IV, stop oral meds.   Diet: Cardiac diet  DVT Prophylaxis: Therapeutic Anticoagulation with Xarelto  Advance goals of care discussion: Full code  Family Communication: no family was present at bedside, at the time of interview.   Disposition:  Discharge to be determined .  Consultants: General surgery Dr. Michaell [name] Procedures: none  Scheduled Meds: . fluticasone  2 spray Each Nare Daily  . ipratropium-albuterol  3 mL Nebulization TID  . [START ON 09/05/2019] methylPREDNISolone (SOLU-MEDROL) injection  40 mg Intravenous Daily  . mometasone-formoterol  2 puff Inhalation BID  . sodium chloride (PF)      . sodium chloride flush  3 mL Intravenous Q12H   Continuous Infusions: . sodium chloride 10 mL/hr at 09/04/19 1700  . sodium chloride 250 mL (09/04/19 1843)  . fluconazole (DIFLUCAN) IV 100 mg (09/04/19 1714)  . piperacillin-tazobactam (ZOSYN)  IV 3.375 g (09/04/19 1845)   PRN Meds: sodium chloride, sodium chloride, acetaminophen **OR** acetaminophen, albuterol, albuterol, alum & mag hydroxide-simeth, chlorpheniramine-HYDROcodone, ipratropium, [DISCONTINUED] ondansetron **OR** ondansetron (ZOFRAN) IV,  sodium chloride, sodium chloride flush Antibiotics: Anti-infectives (From admission, onward)   Start     Dose/Rate Route Frequency Ordered Stop   09/04/19 1800  fluconazole (DIFLUCAN) IVPB 100 mg      100 mg 50 mL/hr over 60 Minutes Intravenous Every 24 hours 09/04/19 1626     09/04/19 1800  piperacillin-tazobactam (ZOSYN) IVPB 3.375 g     3.375 g 12.5 mL/hr over 240 Minutes Intravenous Every 8 hours 09/04/19 1644     09/02/19 1000  doxycycline (VIBRA-TABS) tablet 100 mg  Status:  Discontinued     100 mg Oral Every 12 hours 09/02/19 0941 09/04/19 1627   08/31/19 0245  azithromycin (ZITHROMAX) 500 mg in sodium chloride 0.9 % 250 mL IVPB  Status:  Discontinued     500 mg 250 mL/hr over 60 Minutes Intravenous Daily at bedtime 08/31/19 0239 09/02/19 0941   08/31/19 0130  cefTRIAXone (ROCEPHIN) 1 g in sodium chloride 0.9 % 100 mL IVPB     1 g 200 mL/hr over 30 Minutes Intravenous  Once 08/31/19 0121 08/31/19 0255       Objective: Physical Exam: Vitals:   09/04/19 0502 09/04/19 0843 09/04/19 1321 09/04/19 1336  BP: (!) 161/99   (!) 138/92  Pulse: 81   94  Resp: 18   17  Temp: (!) 97.5 F (36.4 C)   97.9 F (36.6 C)  TempSrc: Oral   Oral  SpO2: 97% 94% 96% 96%  Weight:      Height:        Intake/Output Summary (Last 24 hours) at 09/04/2019 1900 Last data filed at 09/04/2019 1815 Gross per 24 hour  Intake 639.52 ml  Output 2025 ml  Net -1385.48 ml   Filed Weights   08/30/19 2331  Weight: 104.3 kg   General: alert and oriented to time, place, and person. Appear in moderate distress, affect appropriate Eyes: PERRL, Conjunctiva normal ENT: Oral Mucosa Clear, moist  Neck: no JVD, no Abnormal Mass Or lumps Cardiovascular: S1 and S2 Present, no Murmur, peripheral pulses symmetrical Respiratory: good respiratory effort, Bilateral Air entry equal and Decreased, no signs of accessory muscle use, Occasional  Crackles, bilateral expiratory  wheezes Abdomen: Bowel Sound present, Soft and mild tenderness, no hernia Skin: no rashes  Extremities: bilateral  Pedal edema, no calf tenderness Neurologic: without any new focal findings Gait not checked due to patient safety concerns    Data Reviewed: I have personally reviewed and interpreted daily labs, tele strips, imagings as discussed above. I reviewed all nursing notes, pharmacy notes, vitals, pertinent old records I have discussed plan of care as described above with RN and patient/family.  CBC: Recent Labs  Lab 08/30/19 2339  08/31/19 0321 09/01/19 0309 09/02/19 0407 09/03/19 0321 09/04/19 0307  WBC 12.3*  --  14.8* 16.4* 16.3* 15.3* 14.7*  NEUTROABS 9.1*  --  13.9* 14.9*  --   --   --   HGB 14.2   < > 14.1 12.7* 12.5* 13.6 13.6  HCT 43.6   < > 44.1 38.9* 38.2* 41.5 42.0  MCV 95.4  --  96.7 97.0 97.0 97.2 97.4  PLT 408*  --  351 357 338 389 374   < > = values in this interval not displayed.   Basic Metabolic Panel: Recent Labs  Lab 08/31/19 0321 09/01/19 0309 09/02/19 0407 09/03/19 0321 09/04/19 0307  NA 132* 131* 132* 134* 136  K 4.2 4.5 4.4 4.0 3.8  CL 96* 99 96* 95* 97*  CO2 23  23 26 28 29   GLUCOSE 165* 140* 135* 150* 153*  BUN 17 17 24* 28* 29*  CREATININE 0.97 0.85 0.85 1.14 1.06  CALCIUM 9.0 8.7* 8.5* 8.7* 8.6*  MG  --   --  2.2 2.3 2.1    Liver Function Tests: Recent Labs  Lab 08/31/19 0321 09/01/19 0309  AST 19 21  ALT 17 15  ALKPHOS 54 47  BILITOT 0.6 0.8  PROT 6.7 6.2*  ALBUMIN 3.7 3.3*   No results for input(s): LIPASE, AMYLASE in the last 168 hours. No results for input(s): AMMONIA in the last 168 hours. Coagulation Profile: No results for input(s): INR, PROTIME in the last 168 hours. Cardiac Enzymes: No results for input(s): CKTOTAL, CKMB, CKMBINDEX, TROPONINI in the last 168 hours. BNP (last 3 results) No results for input(s): PROBNP in the last 8760 hours. CBG: Recent Labs  Lab 08/31/19 0805 09/01/19 0752 09/02/19 0745 09/03/19 0753 09/04/19 0953  GLUCAP 168* 142* 124* 130* 240*   Studies: Ct Abdomen Pelvis W Contrast  Result Date: 09/04/2019 CLINICAL DATA:  New free air by plain radiography. COPD, hypoxia respiratory failure, CHF EXAM: CT ABDOMEN AND  PELVIS WITH CONTRAST TECHNIQUE: Multidetector CT imaging of the abdomen and pelvis was performed using the standard protocol following bolus administration of intravenous contrast. CONTRAST:  09/06/2019 OMNIPAQUE IOHEXOL 300 MG/ML  SOLN COMPARISON:  None. FINDINGS: Lower chest: Basilar emphysema changes with hyperinflation. Normal heart size. No pericardial or pleural effusion. Aorta atherosclerotic. Hepatobiliary: No focal liver abnormality is seen. No gallstones, gallbladder wall thickening, or biliary dilatation. Pancreas: Unremarkable. No pancreatic ductal dilatation or surrounding inflammatory changes. Spleen: Normal in size without focal abnormality. Adrenals/Urinary Tract: Normal adrenal glands. Kidneys demonstrate parapelvic cysts bilaterally. No renal obstruction or hydronephrosis. No hydroureter or ureteral dilatation. Bladder is partially obscured by the left hip replacement hardware. Stomach/Bowel: Diffuse large volume of pneumoperitoneum throughout the abdomen compatible with perforated viscus. In the left mid abdomen, there 2 loops of dilated abnormal appearing small bowel with fecalization and what appears to be pneumatosis suspicious for ischemia. Stomach and duodenum are underdistended without surrounding inflammatory change or fluid. Moderate colonic stool burden. Normal appendix. Diverticulosis in the sigmoid region without acute inflammatory process or wall thickening. No fluid collection, abscess, hemorrhage, or ascites. Vascular/Lymphatic: Extensive calcific atherosclerosis of the aorta, iliac and abdominal vasculature. Mesenteric and renal vasculature do appear patent proximally. No veno-occlusive process.  No bulky adenopathy. Reproductive: Unremarkable by CT. Other: Very small umbilical hernia containing fat and free air. No large ventral hernia. No inguinal hernia. Musculoskeletal: Degenerative changes of the spine. Previous left hip arthroplasty. No acute osseous finding. No compression  fracture. IMPRESSION: Large volume of abdominopelvic pneumoperitoneum with dilated abnormal appearing small bowel in the left abdomen with fecalization and pneumatosis suspicious for ischemic small bowel. No significant free fluid, hemorrhage or abscess. These results were called by telephone at the time of interpretation on 09/04/2019 at 6:53 pm to provider 09/06/2019, who verbally acknowledged these results. Electronically Signed   By: Ovidio Kin.  Shick M.D.   On: 09/04/2019 18:56   Dg Abd Acute 2+v W 1v Chest  Addendum Date: 09/04/2019   ADDENDUM REPORT: 09/04/2019 16:14 ADDENDUM: These results were called by telephone at the time of interpretation on 09/04/2019 at 4:14 pm to provider [facility] Alaa Mullally , who verbally acknowledged these results. Electronically Signed   By: [facility] M.D.   On: 09/04/2019 16:14   Result Date: 09/04/2019 CLINICAL DATA:  Shortness of breath EXAM: DG ABDOMEN ACUTE  W/ 1V CHEST COMPARISON:  September 02, 2019 FINDINGS: The lungs are hyperexpanded. There is no pneumothorax. Emphysematous changes are noted. There is an airspace opacity in the right upper lobe. Pleuroparenchymal scarring is noted the lung apices. Heart size is stable. Scarring versus atelectasis is noted at the lung bases. Evaluation of the abdomen demonstrates pneumoperitoneum. There are dilated loops of small bowel measuring up to approximately 5 cm. There is a large amount of stool in the right hemicolon. The patient is status post total hip arthroplasty on the left. Degenerative changes are noted throughout the lumbar spine. IMPRESSION: 1. Moderate volume pneumoperitoneum concerning for hollow viscus perforation. 2. Dilated loops of small bowel concerning for small bowel obstruction. 3. Right upper lobe airspace opacity. This could represent atelectasis or developing infiltrate. A pulmonary nodule is not entirely excluded. A 4-6 week follow-up two-view chest x-ray is recommended to confirm resolution of this  finding. 4. Large amount of stool in the right hemicolon. Electronically Signed: By: Constance Holster M.D. On: 09/04/2019 16:05     Time spent: 35 minutes  Author: Berle Mull, MD [facility] 09/04/2019 7:00 PM  To reach On-call, see care teams to locate the attending and reach out to them via [email or web address]. If 7PM-7AM, please contact night-coverage If you still have difficulty reaching the attending provider, please page the [facility] (Director on Call) for [facility] on amion for assistance.

## 2019-09-04 NOTE — Anesthesia Postprocedure Evaluation (Signed)
Anesthesia Post Note  Patient: Theodore [name]  Procedure(s) Performed: EXPLORATORY LAPAROTOMY (N/A )     Patient location during evaluation: PACU Anesthesia Type: General Level of consciousness: sedated and patient remains intubated per anesthesia plan Pain management: pain level controlled Vital Signs Assessment: post-procedure vital signs reviewed and stable Respiratory status: patient on ventilator - see flowsheet for VS and patient remains intubated per anesthesia plan Cardiovascular status: stable Anesthetic complications: no    Last Vitals:  Vitals:   09/04/19 2307 09/04/19 2316  BP: (!) 209/103 (!) 160/105  Pulse: (!) 101 96  Resp: (!) 22 14  Temp:  (!) 36 C  SpO2: 98% 98%    Last Pain:  Vitals:   09/04/19 2316  TempSrc: Axillary  PainSc:                  Nolon Nations

## 2019-09-04 NOTE — Progress Notes (Signed)
PHARMACY NOTE -  [facility] has been assisting with dosing of Zosyn for IAI.  Dosage remains stable at 3.375 g IV q8 hr and need for further dosage adjustment appears unlikely at present given decent renal function  Pharmacy will sign off, following peripherally for culture results or dose adjustments. Please reconsult if a change in clinical status warrants re-evaluation of dosage.  Reuel Boom, PharmD, BCPS [phone] 09/04/2019, 4:42 PM

## 2019-09-04 NOTE — Consult Note (Signed)
Theodore [name] [facility]  September 05, 1956 [id number]  CARE TEAM:  PCP: [name], MD  Outpatient Care Team: Patient Care Team: [name], MD as PCP - General (Internal Medicine) Frederica Kuster, MD as Referring Physician (Family Medicine) Lupita Leash, MD as Consulting Physician (Pulmonary Disease)  Inpatient Treatment Team: Treatment Team: Attending Provider: Rolly Salter, MD; Rounding Team: Merlyn Albert, MD; Utilization Review: Georgena Spurling, RN; Registered Nurse: Hilliard Clark, RN; Registered Nurse: Quentin Cornwall, RN; Case Manager: Shon Baton, RN; Consulting Physician: Montez Morita, Md, MD   This patient is a 63 y.o.male who presents today for surgical evaluation at the request of Dr Lynden Oxford.   Chief complaint / Reason for evaluation: Free air on AXR  Former smoker with steroid-dependent COPD and prior admissions for chronic hypoxic respiratory failure.  Not normally on home oxygen although occasionally he does have 2 L at night..  Coronary disease.  Worsening shortness of breath at home.  Called EMS.  Found to have COPD exacerbation.  Admitted 08/31/2019.  Gradually improving with steroid bolus in diuresis.  Placed on IV and switch to oral antibiotics..  Now on 2 L nasal cannula.  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  Last dose given this morning.  Apparently had complaints of abdominal discomfort.  History of chronic constipation.  Plain films done this afternoon showed concern for free air.  Dilated small bowel loops concerning for possible bowel obstruction.  Stool in right colon.  Surgical consultation requested  Patient sitting up smiling in room.  Denies really any abdominal pain.  He does confess that he had emesis a few days ago x1.  That is it.  He has not had a bowel movement in 4 days.  He tends to get constipated but usually can move his bowels every other day.  He is never had prior abdominal surgeries.  He recalls having a normal colonoscopy  less than 5 years ago.  Records show a colonoscopy with some sigmoid diverticulosis in 2017 by Dr. Myrtie Neither with the [facility].  Assessment  Theodore [name]  63 y.o. male       Problem List:  Principal Problem:   COPD with acute exacerbation (HCC) Active Problems:   Recurrent pulmonary emboli (HCC)   COPD GOLD IV    COPD exacerbation (HCC)   Chronic respiratory failure assoc with cor pulmonale   CAD (coronary artery disease)   DOE (dyspnea on exertion)   Chronic anticoagulation   Chronic respiratory failure with hypoxia (HCC)   Hyponatremia   Acute on chronic respiratory failure with hypoxia (HCC)   Pneumoperitoneum   Probable free air of uncertain etiology in a patient that does not seem particularly symptomatic but is on steroids  Plan:  Most likely etiology would be perforated ulcer or perforated colon from diverticulitis.  Steroids can mask some symptoms and hemodynamics, but usually patients have some form of peritonitis.  Free air is a new finding compared to the films from 2 days ago  CT scan with oral and IV contrast.  Discussed the case with Dr. Ezzard Standing who is covering tonight.  He will follow-up to determine if patient requires emergent operative intervention versus bowel rest and IV antibiotics and regrouping.  IV antibiotics.  Would do Zosyn for broad coverage and more complicated cases  Hold anticoagulation.  Last dose of Xarelto this morning.  Ideally would wait 48 hours prior to proceeding with any abdominal surgery, but that may be  overruled if he has frank perforation and leak.  Improved from a pulmonary standpoint but obviously increased operative risk with his history of COPD.  -VTE prophylaxis- SCDs, etc -mobilize as tolerated to help recovery  45 minutes spent in review, evaluation, examination, counseling, and coordination of care.  More than 50% of that time was spent in counseling.  Ardeth Sportsman, MD, FACS, MASCRS Gastrointestinal and  Minimally Invasive Surgery  [facility] [address] [place], [place] [address] [phone] Main / Paging [phone] Fax     09/04/2019      Past Medical History:  Diagnosis Date   Anxiety    Arthritis    "back" (01/24/2014)   Asthma    Chronic bronchitis (HCC)    Cluster headache    "last bout was summer 2014; had them q spring 1991-2001" (01/24/2014)   COPD (chronic obstructive pulmonary disease) (HCC)    GERD (gastroesophageal reflux disease)    NSTEMI (non-ST elevated myocardial infarction) (HCC) 01/2014   On home oxygen therapy    "2L; 24/7" (01/24/2014)   Pneumonia 06/2013; 01/2014   Pulmonary embolism (HCC) 01/23/2014   Seasonal allergies     Past Surgical History:  Procedure Laterality Date   COLONOSCOPY WITH PROPOFOL N/A 10/20/2016   Procedure: COLONOSCOPY WITH PROPOFOL;  Surgeon: Sherrilyn Rist, MD;  Location: [facility];  Service: Gastroenterology;  Laterality: N/A;   NASAL SEPTOPLASTY W/ TURBINOPLASTY Left 1983   TOTAL HIP ARTHROPLASTY Left 07/16/2017   Procedure: TOTAL HIP ARTHROPLASTY ANTERIOR APPROACH;  Surgeon: Tarry Kos, MD;  Location: [facility] OR;  Service: Orthopedics;  Laterality: Left;   TURBINATE REDUCTION Bilateral ~ 1983    Social History   Socioeconomic History   Marital status: Divorced    Spouse name: Not on file   Number of children: 0   Years of education: 14   Highest education level: Not on file  Occupational History   Occupation: Disability    Employer: [employer]  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   Food insecurity    Worry: Never true    Inability: Never true   Transportation needs    Medical: No    Non-medical: No  Tobacco Use   Smoking status: Former Smoker    Packs/day: 2.00    Years: 35.00    Pack years: 70.00    Types: Cigarettes    Quit date: 01/24/2012    Years since quitting: 7.6   Smokeless tobacco: Never Used    Tobacco comment: Remain smoke free  Substance and Sexual Activity   Alcohol use: Not Currently   Drug use: No   Sexual activity: Never  Lifestyle   Physical activity    Days per week: 0 days    Minutes per session: 0 min   Stress: Only a little  Relationships   Social connections    Talks on phone: More than three times a week    Gets together: More than three times a week    Attends religious service: Never    Active member of club or organization: No    Attends meetings of clubs or organizations: Never    Relationship status: Not on file   Intimate partner violence    Fear of current or ex partner: Not on file    Emotionally abused: Not on file    Physically abused: Not on file    Forced sexual activity: Not on file  Other Topics  Concern   Not on file  Social History Narrative   Born in [place], [place] and moved around a lot. Dad was an Art gallery manager and moved frequently.       Family History  Problem Relation Age of Onset   Leukemia Father    Heart disease Father    Asthma Father    Asthma Sister    Emphysema Sister    Heart disease Sister    High blood pressure Mother    Colon cancer Cousin        First cousin    Current Facility-Administered Medications  Medication Dose Route Frequency Provider Last Rate Last Dose   0.9 %  sodium chloride infusion   Intravenous PRN Opyd, Lavone Neri, MD 10 mL/hr at 09/04/19 1400     0.9 %  sodium chloride infusion  250 mL Intravenous PRN Opyd, Lavone Neri, MD       acetaminophen (TYLENOL) tablet 650 mg  650 mg Oral Q6H PRN Opyd, Lavone Neri, MD       Or   acetaminophen (TYLENOL) suppository 650 mg  650 mg Rectal Q6H PRN Opyd, Lavone Neri, MD       albuterol (PROVENTIL) (2.5 MG/3ML) 0.083% nebulizer solution 2.5 mg  2.5 mg Nebulization Q4H PRN Opyd, Lavone Neri, MD       albuterol (VENTOLIN HFA) 108 (90 Base) MCG/ACT inhaler 2 puff  2 puff Inhalation Q6H PRN Rolly Salter, MD   2 puff at 09/03/19 1900   alum & mag  hydroxide-simeth (MAALOX/MYLANTA) 200-200-20 MG/5ML suspension 30 mL  30 mL Oral Q4H PRN Rolly Salter, MD   30 mL at 09/01/19 1304   chlorpheniramine-HYDROcodone (TUSSIONEX) 10-8 MG/5ML suspension 5 mL  5 mL Oral Q12H PRN Rolly Salter, MD       fluconazole (DIFLUCAN) IVPB 100 mg  100 mg Intravenous Q24H Rolly Salter, MD       fluticasone (FLONASE) 50 MCG/ACT nasal spray 2 spray  2 spray Each Nare Daily Rolly Salter, MD   2 spray at 09/04/19 0926   ipratropium (ATROVENT HFA) inhaler 2 puff  2 puff Inhalation Q6H PRN Rolly Salter, MD       ipratropium-albuterol (DUONEB) 0.5-2.5 (3) MG/3ML nebulizer solution 3 mL  3 mL Nebulization TID Rolly Salter, MD   3 mL at 09/04/19 1320   [START ON 09/05/2019] methylPREDNISolone sodium succinate (SOLU-MEDROL) 40 mg/mL injection 40 mg  40 mg Intravenous Daily Rolly Salter, MD       mometasone-formoterol (DULERA) 200-5 MCG/ACT inhaler 2 puff  2 puff Inhalation BID Opyd, Lavone Neri, MD   2 puff at 09/04/19 0853   ondansetron (ZOFRAN) injection 4 mg  4 mg Intravenous Q6H PRN Opyd, Lavone Neri, MD   4 mg at 09/01/19 1443   piperacillin-tazobactam (ZOSYN) IVPB 3.375 g  3.375 g Intravenous Q8H Wofford, Drew A, RPH       sodium chloride (OCEAN) 0.65 % nasal spray 1 spray  1 spray Each Nare PRN Rolly Salter, MD       sodium chloride flush (NS) 0.9 % injection 3 mL  3 mL Intravenous Q12H Opyd, Lavone Neri, MD   3 mL at 09/04/19 0933   sodium chloride flush (NS) 0.9 % injection 3 mL  3 mL Intravenous PRN Opyd, Lavone Neri, MD         Allergies  Allergen Reactions   Naproxen Sodium Itching    ROS:   All other systems reviewed & are  negative except per HPI or as noted below: Constitutional:  No fevers, chills, sweats.  Weight stable Eyes:  No vision changes, No discharge HENT:  No sore throats, nasal drainage Lymph: No neck swelling, No bruising easily Pulmonary.  Mildly productive cough.  No worsening dyspnea on exertion since  stabilized CV: No orthopnea, PND  Patient walks 10 minuteswithout difficulty.  No exertional chest/neck/shoulder/arm pain. GI: No personal nor family history of GI/colon cancer, inflammatory bowel disease, irritable bowel syndrome, allergy such as Celiac Sprue, dietary/dairy problems, colitis, ulcers nor gastritis.  No recent sick contacts/gastroenteritis.  No travel outside the country.  No changes in diet. Renal: No UTIs, No hematuria Genital:  No drainage, bleeding, masses Musculoskeletal: No severe joint pain.  Good ROM major joints Skin:  No sores or lesions.  No rashes Heme/Lymph:  No easy bleeding.  No swollen lymph nodes Neuro: No focal weakness/numbness.  No seizures Psych: No suicidal ideation.  No hallucinations  BP (!) 138/92 (BP Location: Right Arm)    Pulse 94    Temp 97.9 F (36.6 C) (Oral)    Resp 17    Ht 6\' 4"  (1.93 m)    Wt 104.3 kg    SpO2 96%    BMI 28.00 kg/m   Physical Exam: General: Pt awake/alert/oriented x4 in no major acute distress.  Smiling.  Pleasant. Eyes: PERRL, normal EOM. Sclera nonicteric Neuro: CN II-XII intact w/o focal sensory/motor deficits. Lymph: No head/neck/groin lymphadenopathy Psych:  No delerium/psychosis/paranoia HENT: Normocephalic, Mucus membranes moist.  No thrush Neck: Supple, No tracheal deviation Chest: No pain.  Good respiratory excursion. CV:  Pulses intact.  Regular rhythm  Abdomen: Very distended but soft.  Some hollow percussion supraumbilically.  No abdominal discomfort or guarding.  Moderate diastases recti.  Nontender.  No incarcerated hernias.  Gen:  No inguinal hernias.  No inguinal lymphadenopathy.   Ext:  SCDs BLE.  No significant edema.  No cyanosis Skin: No petechiae / purpurea.  No major sores Musculoskeletal: No severe joint pain.  Good ROM major joints   Results:   Labs: Results for orders placed or performed during the hospital encounter of 08/30/19 (from the past 48 hour(s))  Basic metabolic panel      Status: Abnormal   Collection Time: 09/03/19  3:21 AM  Result Value Ref Range   Sodium 134 (L) 135 - 145 mmol/L   Potassium 4.0 3.5 - 5.1 mmol/L   Chloride 95 (L) 98 - 111 mmol/L   CO2 28 22 - 32 mmol/L   Glucose, Bld 150 (H) 70 - 99 mg/dL   BUN 28 (H) 8 - 23 mg/dL   Creatinine, Ser 6.961.14 0.61 - 1.24 mg/dL   Calcium 8.7 (L) 8.9 - 10.3 mg/dL   GFR calc non Af Amer >60 >60 mL/min   GFR calc Af Amer >60 >60 mL/min   Anion gap 11 5 - 15    Comment: Performed at [facility], [address] [place], [place] [id number]  CBC     Status: Abnormal   Collection Time: 09/03/19  3:21 AM  Result Value Ref Range   WBC 15.3 (H) 4.0 - 10.5 K/uL   RBC 4.27 4.22 - 5.81 MIL/uL   Hemoglobin 13.6 13.0 - 17.0 g/dL   HCT 41.341.5 24.439.0 - 01.052.0 %   MCV 97.2 80.0 - 100.0 fL   MCH 31.9 26.0 - 34.0 pg   MCHC 32.8 30.0 - 36.0 g/dL   RDW 27.213.5 53.611.5 - 64.415.5 %  Platelets 389 150 - 400 K/uL   nRBC 0.0 0.0 - 0.2 %    Comment: Performed at [facility], [place] [address], [place], [place] [address]  Magnesium     Status: None   Collection Time: 09/03/19  3:21 AM  Result Value Ref Range   Magnesium 2.3 1.7 - 2.4 mg/dL    Comment: Performed at [facility], [place] [address], [place], [place] [address]  Glucose, capillary     Status: Abnormal   Collection Time: 09/03/19  7:53 AM  Result Value Ref Range   Glucose-Capillary 130 (H) 70 - 99 mg/dL  Basic metabolic panel     Status: Abnormal   Collection Time: 09/04/19  3:07 AM  Result Value Ref Range   Sodium 136 135 - 145 mmol/L   Potassium 3.8 3.5 - 5.1 mmol/L   Chloride 97 (L) 98 - 111 mmol/L   CO2 29 22 - 32 mmol/L   Glucose, Bld 153 (H) 70 - 99 mg/dL   BUN 29 (H) 8 - 23 mg/dL   Creatinine, Ser 1.06 0.61 - 1.24 mg/dL   Calcium 8.6 (L) 8.9 - 10.3 mg/dL   GFR calc non Af Amer >60 >60 mL/min   GFR calc Af Amer >60 >60 mL/min   Anion gap 10 5 - 15    Comment: Performed at [facility], [place]  [address], [place], [place] [address]  CBC     Status: Abnormal   Collection Time: 09/04/19  3:07 AM  Result Value Ref Range   WBC 14.7 (H) 4.0 - 10.5 K/uL   RBC 4.31 4.22 - 5.81 MIL/uL   Hemoglobin 13.6 13.0 - 17.0 g/dL   HCT 42.0 39.0 - 52.0 %   MCV 97.4 80.0 - 100.0 fL   MCH 31.6 26.0 - 34.0 pg   MCHC 32.4 30.0 - 36.0 g/dL   RDW 13.2 11.5 - 15.5 %   Platelets 374 150 - 400 K/uL   nRBC 0.0 0.0 - 0.2 %    Comment: Performed at [facility], [place] [address], [place], [place] [address]  Magnesium     Status: None   Collection Time: 09/04/19  3:07 AM  Result Value Ref Range   Magnesium 2.1 1.7 - 2.4 mg/dL    Comment: Performed at [facility], [place] [address] [place],  [address]  Glucose, capillary     Status: Abnormal   Collection Time: 09/04/19  9:53 AM  Result Value Ref Range   Glucose-Capillary 240 (H) 70 - 99 mg/dL    Imaging / Studies: Dg Chest Portable 1 View  Result Date: 08/31/2019 CLINICAL DATA:  Shortness of breath. Cough. Wheezing. COPD. EXAM: PORTABLE CHEST 1 VIEW COMPARISON:  Radiograph 07/17/2017, CT 12/03/2016 FINDINGS: Chronic hyperinflation and emphysema. Unchanged heart size and mediastinal contours. No acute airspace disease, pulmonary edema, large pleural effusion or pneumothorax. There is biapical pleuroparenchymal scarring. No acute osseous abnormalities. IMPRESSION: Chronic hyperinflation and emphysema. No superimposed acute abnormality. Electronically Signed   By: Keith Rake M.D.   On: 08/31/2019 00:15   Dg Abd Acute 2+v W 1v Chest  Addendum Date: 09/04/2019   ADDENDUM REPORT: 09/04/2019 16:14 ADDENDUM: These results were called by telephone at the time of interpretation on 09/04/2019 at 4:14 pm to provider [facility] PATEL , who verbally acknowledged these results. Electronically Signed   By: Constance Holster M.D.   On: 09/04/2019 16:14   Result Date: 09/04/2019 CLINICAL DATA:  Shortness  of breath EXAM: DG  ABDOMEN ACUTE W/ 1V CHEST COMPARISON:  September 02, 2019 FINDINGS: The lungs are hyperexpanded. There is no pneumothorax. Emphysematous changes are noted. There is an airspace opacity in the right upper lobe. Pleuroparenchymal scarring is noted the lung apices. Heart size is stable. Scarring versus atelectasis is noted at the lung bases. Evaluation of the abdomen demonstrates pneumoperitoneum. There are dilated loops of small bowel measuring up to approximately 5 cm. There is a large amount of stool in the right hemicolon. The patient is status post total hip arthroplasty on the left. Degenerative changes are noted throughout the lumbar spine. IMPRESSION: 1. Moderate volume pneumoperitoneum concerning for hollow viscus perforation. 2. Dilated loops of small bowel concerning for small bowel obstruction. 3. Right upper lobe airspace opacity. This could represent atelectasis or developing infiltrate. A pulmonary nodule is not entirely excluded. A 4-6 week follow-up two-view chest x-ray is recommended to confirm resolution of this finding. 4. Large amount of stool in the right hemicolon. Electronically Signed: By: Katherine Mantle M.D. On: 09/04/2019 16:05   Dg Abd Portable 1v  Result Date: 09/02/2019 CLINICAL DATA:  Brief episode of heartburn with nausea and vomiting yesterday. EXAM: PORTABLE ABDOMEN - 1 VIEW COMPARISON:  Abdominal x-ray dated July 18, 2017. FINDINGS: The bowel gas pattern is normal. Moderate amount of stool in the right colon. No radio-opaque calculi or other significant radiographic abnormality are seen. No acute osseous abnormality. Prior left hip total arthroplasty. IMPRESSION: No acute findings. Electronically Signed   By: Obie Dredge M.D.   On: 09/02/2019 13:17    Medications / Allergies: per chart  Antibiotics: Anti-infectives (From admission, onward)   Start     Dose/Rate Route Frequency Ordered Stop   09/04/19 1800  fluconazole (DIFLUCAN) IVPB 100 mg     100 mg 50 mL/hr  over 60 Minutes Intravenous Every 24 hours 09/04/19 1626     09/04/19 1800  piperacillin-tazobactam (ZOSYN) IVPB 3.375 g     3.375 g 12.5 mL/hr over 240 Minutes Intravenous Every 8 hours 09/04/19 1644     09/02/19 1000  doxycycline (VIBRA-TABS) tablet 100 mg  Status:  Discontinued     100 mg Oral Every 12 hours 09/02/19 0941 09/04/19 1627   08/31/19 0245  azithromycin (ZITHROMAX) 500 mg in sodium chloride 0.9 % 250 mL IVPB  Status:  Discontinued     500 mg 250 mL/hr over 60 Minutes Intravenous Daily at bedtime 08/31/19 0239 09/02/19 0941   08/31/19 0130  cefTRIAXone (ROCEPHIN) 1 g in sodium chloride 0.9 % 100 mL IVPB     1 g 200 mL/hr over 30 Minutes Intravenous  Once 08/31/19 0121 08/31/19 0255        Note: Portions of this report may have been transcribed using voice recognition software. Every effort was made to ensure accuracy; however, inadvertent computerized transcription errors may be present.   Any transcriptional errors that result from this process are unintentional.    Ardeth Sportsman, MD, FACS, MASCRS Gastrointestinal and Minimally Invasive Surgery  [facility] [address], [address] [place], [place] [address] [phone] Main / Paging [phone] Fax     09/04/2019

## 2019-09-05 ENCOUNTER — Inpatient Hospital Stay (HOSPITAL_COMMUNITY): Payer: PPO

## 2019-09-05 ENCOUNTER — Encounter (HOSPITAL_COMMUNITY): Payer: Self-pay | Admitting: Surgery

## 2019-09-05 DIAGNOSIS — K668 Other specified disorders of peritoneum: Secondary | ICD-10-CM | POA: Diagnosis not present

## 2019-09-05 DIAGNOSIS — L8990 Pressure ulcer of unspecified site, unspecified stage: Secondary | ICD-10-CM | POA: Insufficient documentation

## 2019-09-05 DIAGNOSIS — J441 Chronic obstructive pulmonary disease with (acute) exacerbation: Secondary | ICD-10-CM | POA: Diagnosis not present

## 2019-09-05 DIAGNOSIS — J9621 Acute and chronic respiratory failure with hypoxia: Secondary | ICD-10-CM | POA: Diagnosis not present

## 2019-09-05 LAB — BASIC METABOLIC PANEL
Anion gap: 12 (ref 5–15)
BUN: 28 mg/dL — ABNORMAL HIGH (ref 8–23)
CO2: 28 mmol/L (ref 22–32)
Calcium: 8.4 mg/dL — ABNORMAL LOW (ref 8.9–10.3)
Chloride: 95 mmol/L — ABNORMAL LOW (ref 98–111)
Creatinine, Ser: 1 mg/dL (ref 0.61–1.24)
GFR calc Af Amer: >60 mL/min (ref >60)
GFR calc non Af Amer: >60 mL/min (ref >60)
Glucose, Bld: 153 mg/dL — ABNORMAL HIGH (ref 70–99)
Potassium: 3.7 mmol/L (ref 3.5–5.1)
Sodium: 135 mmol/L (ref 135–145)

## 2019-09-05 LAB — TRIGLYCERIDES: Triglycerides: 127 mg/dL (ref <150)

## 2019-09-05 LAB — CBC
HCT: 44.1 % (ref 39.0–52.0)
Hemoglobin: 14.5 g/dL (ref 13.0–17.0)
MCH: 31.9 pg (ref 26.0–34.0)
MCHC: 32.9 g/dL (ref 30.0–36.0)
MCV: 96.9 fL (ref 80.0–100.0)
Platelets: 367 10*3/uL (ref 150–400)
RBC: 4.55 MIL/uL (ref 4.22–5.81)
RDW: 13.5 % (ref 11.5–15.5)
WBC: 28.1 10*3/uL — ABNORMAL HIGH (ref 4.0–10.5)
nRBC: 0 % (ref 0.0–0.2)

## 2019-09-05 LAB — MAGNESIUM: Magnesium: 2.1 mg/dL (ref 1.7–2.4)

## 2019-09-05 LAB — ABO/RH: ABO/RH(D): A POS

## 2019-09-05 LAB — BLOOD GAS, ARTERIAL
Bicarbonate: 27.3 mmol/L (ref 20.0–28.0)
FIO2: 60.1
Patient temperature: 98.6
pCO2 arterial: 35.6 mmHg (ref 32.0–48.0)
pH, Arterial: 7.497 — ABNORMAL HIGH (ref 7.350–7.450)
pO2, Arterial: 148 mmHg — ABNORMAL HIGH (ref 83.0–108.0)

## 2019-09-05 LAB — MRSA PCR SCREENING: MRSA by PCR: NEGATIVE

## 2019-09-05 LAB — GLUCOSE, CAPILLARY: Glucose-Capillary: 135 mg/dL — ABNORMAL HIGH (ref 70–99)

## 2019-09-05 MED ORDER — FENTANYL CITRATE (PF) 100 MCG/2ML IJ SOLN: 25.0000 ug | INTRAMUSCULAR | Status: DC | PRN

## 2019-09-05 MED ORDER — PANTOPRAZOLE SODIUM 40 MG IV SOLR
40.0000 mg | Freq: Every day | INTRAVENOUS | Status: DC
  Administered 2019-09-05 – 2019-09-11 (×7): 40 mg via INTRAVENOUS
  Filled 2019-09-05 (×7): qty 40

## 2019-09-05 MED ORDER — LIP MEDEX EX OINT
1.0000 "application " | TOPICAL_OINTMENT | Freq: Two times a day (BID) | CUTANEOUS | Status: DC
  Administered 2019-09-05 – 2019-09-18 (×26): 1 via TOPICAL
  Filled 2019-09-05 (×3): qty 7

## 2019-09-05 MED ORDER — SODIUM CHLORIDE 0.9 % IV BOLUS
500.0000 mL | Freq: Once | INTRAVENOUS | Status: AC
  Administered 2019-09-05: 500 mL via INTRAVENOUS

## 2019-09-05 MED ORDER — IPRATROPIUM-ALBUTEROL 0.5-2.5 (3) MG/3ML IN SOLN
3.0000 mL | Freq: Four times a day (QID) | RESPIRATORY_TRACT | Status: DC
  Administered 2019-09-05 – 2019-09-13 (×34): 3 mL via RESPIRATORY_TRACT
  Filled 2019-09-05 (×34): qty 3

## 2019-09-05 MED ORDER — MAGIC MOUTHWASH
15.0000 mL | Freq: Four times a day (QID) | ORAL | Status: DC | PRN
  Administered 2019-09-09: 15 mL via ORAL
  Filled 2019-09-05 (×2): qty 15

## 2019-09-05 MED ORDER — BUDESONIDE 0.5 MG/2ML IN SUSP
0.5000 mg | Freq: Two times a day (BID) | RESPIRATORY_TRACT | Status: DC
  Administered 2019-09-05 – 2019-09-18 (×28): 0.5 mg via RESPIRATORY_TRACT
  Filled 2019-09-05 (×26): qty 2

## 2019-09-05 MED ORDER — ARFORMOTEROL TARTRATE 15 MCG/2ML IN NEBU
15.0000 ug | INHALATION_SOLUTION | Freq: Two times a day (BID) | RESPIRATORY_TRACT | Status: DC
  Administered 2019-09-05 – 2019-09-18 (×27): 15 ug via RESPIRATORY_TRACT
  Filled 2019-09-05 (×27): qty 2

## 2019-09-05 MED ORDER — METOPROLOL TARTRATE 5 MG/5ML IV SOLN
2.5000 mg | INTRAVENOUS | Status: DC | PRN
  Administered 2019-09-06: 2.5 mg via INTRAVENOUS
  Administered 2019-09-07 – 2019-09-11 (×9): 5 mg via INTRAVENOUS
  Filled 2019-09-05 (×10): qty 5

## 2019-09-05 MED ORDER — BISACODYL 10 MG RE SUPP
10.0000 mg | Freq: Every day | RECTAL | Status: DC
  Administered 2019-09-05 – 2019-09-18 (×13): 10 mg via RECTAL
  Filled 2019-09-05 (×13): qty 1

## 2019-09-05 MED ORDER — SODIUM CHLORIDE 0.9 % IV BOLUS
1000.0000 mL | Freq: Once | INTRAVENOUS | Status: AC
  Administered 2019-09-05: 1000 mL via INTRAVENOUS

## 2019-09-05 MED ORDER — ENOXAPARIN SODIUM 100 MG/ML ~~LOC~~ SOLN
100.0000 mg | Freq: Two times a day (BID) | SUBCUTANEOUS | Status: DC
  Administered 2019-09-05 – 2019-09-07 (×5): 100 mg via SUBCUTANEOUS
  Filled 2019-09-05 (×6): qty 1

## 2019-09-05 MED ORDER — METHYLPREDNISOLONE SODIUM SUCC 40 MG IJ SOLR
40.0000 mg | Freq: Two times a day (BID) | INTRAMUSCULAR | Status: DC
  Administered 2019-09-05 – 2019-09-06 (×4): 40 mg via INTRAVENOUS
  Filled 2019-09-05 (×4): qty 1

## 2019-09-05 NOTE — Progress Notes (Signed)
Per bedside report from OR RN; pt converted from NSR to controlled Afib. Pt does not have a known history, so ICU RN performed an EKG per [facility] orders. EKG showed rate-controlled Afib; CCM notified and no new orders were placed at this time. RN will continue to monitor.

## 2019-09-05 NOTE — Progress Notes (Signed)
NP called earlier in shift by Dr. Lucia Gaskins regarding need to take pt to surgery due to findings on CT abd performed earlier. Per Dr. Lucia Gaskins, due to pt's hx of respiratory failure, he may need to go to ICU after surgery intubated. NP called [facility] at that time to make them aware of pt. Later, around 2230 hrs 09/04/2019, Dr. Lucia Gaskins called back stating pt needed an ICU bed as he could not be extubated at this time. NP called [facility] back. PCCM came to see pt and assumed care from [facility]. KJKG, NP [facility]

## 2019-09-05 NOTE — Progress Notes (Signed)
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  He developed abdominal pain & was found to have pneumoperitoneum on 10/26.  Subsequently was taken for exploratory laparotomy for possible pneumoperitoneum and ischemic small bowel. However, no underlying cause for pneumoperitoneum found. Remained intubated post-operatively, to ICU.  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  To OR with no source found. AF after surgery. 10/26 Ex-lap >> "he had no evidence of ischemia, perforation, or abnormal changes of his small bowel.  He did had a bolus of food with undigested food particles that bulged in the small bowel, and I suspect, that this is what gave the impression of pneumatosis in the small bowel."   10/27 Bronchospastic on vent  Consults:  Surgery   Procedures:    Significant Diagnostic Tests:   ECHO 10/22 >> LVEF 65-70%, hyperdynamic function, no LVH, RV mildly enlarged, global RV function normal, LA normal, RA normal  ABD CXR 10/26 >> moderate volume pneumoperitoneum concerning for hollow viscus perforation, dilated loops of small bowel concerning for SBO, RUL airspace opacity, large amt stool in right hemicolon  CT ABD 10/26 >> large volume of abdominopelvic pneumoperitoneum with dilated abnormal  appearing small bowel in the left abdomen with fecalization and pneumatosis suspicious for ischemic small bowel, no significant free fluid, hemorrhage or abscess  Micro Data:  COVID 10/21 >> negative  MRSA PCR 10/26 >> negative   Antimicrobials:  Ceftriaxone x1 10/21  Azithromycin 10/21 >> 10/23  Doxycycline 1024 >> 10/26 Fluconazole 10/26 >> 10/27 Zosyn 10/26 >>   Interim history/subjective:  RN reports temp of 100.4, pt weaning on PSV.  No acute events overnight. I/O - 2.7L UOP in 24h, net even.   Objective   Blood pressure (!) 112/55, pulse (!) 122, temperature 99.2 F (37.3 C), temperature source Oral, resp. rate 20, height 6\' 4"  (1.93 m), weight 103 kg, SpO2 94 %.    Vent Mode: PSV FiO2 (%):  [40 %-60 %] 40 % Set Rate:  [14 bmp] 14 bmp Vt Set:  [690 mL] 690 mL PEEP:  [5 cmH20] 5 cmH20 Pressure Support:  [5 cmH20] 5 cmH20 Plateau Pressure:  [22 cmH20-29 cmH20] 22 cmH20   Intake/Output Summary (Last 24 hours) at 09/05/2019 0850 Last data filed at 09/05/2019 0700 Gross per 24 hour  Intake 2866.83 ml  Output 2785 ml  Net 81.83 ml   Filed Weights   08/30/19 2331 09/04/19 2316  Weight: 104.3 kg 103 kg    Examination: General: elderly gentleman, ill appearing, lying in bed on vent in NAD HEENT: MM pink/moist, ETT, beard, no nasal drainage Neuro: Awakens, alert, indicates throat soreness when asked about pain, follows commands, MAE CV: s1s2 irr irr, AF 100's on monitor with PVC's, no m/r/g PULM:  Prolonged exp phase, diminished breath sounds, wheezing bilaterally, abd accessory muscle use  but no distress  GI: soft, bsx4 hypoactive, protuberant, midline incision with small amt drainage on dressing Extremities: warm/dry, BLE edema with unna boots  Skin: no rashes or lesion   Resolved Hospital Problem list     Assessment & Plan:   Pneumoperitoneum  S/p ex lap for pneumoperitoneum without acute findings.  -post operative recommendations per [facility], appreciate  assistance -empiric zosyn, likely can discontinue abx coverage 10/28 if remains afebrile, no increase in WBC  -follow abdominal exam  -ok with [facility] if patient is extubated when meets criteria  -discontinue fluconazole with no gross perforation   Acute on Chronic Hypoxemic Respiratory Failure Acute Exacerbation of COPD  In setting of obstructive lung disease with acute exacerbation, 2L O2 dependent at baseline -PRVC 8cc/kg  -wean PEEP / FiO2 for sats 88-94% -Daily PSV wean as tolerated, will reassess for extubation post bronchodilators -Propofol + PRN fentanyl for RASS Goal of 0 to -1  -continue solumedrol 40 mg IV to BID  -add Brovana + Pulmicort BID  -duoneb Q6   Atrial Fibrillation  New onset overnight 10/27 -ICU monitoring  -consider heparin gtt if remains in AF 10/27 PM  -PRN lopressor for rate control   Rising Sr Cr  -hold further lasix 10/27, reassess 10/28 for dosing  -Trend BMP / urinary output -Replace electrolytes as indicated -Avoid nephrotoxic agents, ensure adequate renal perfusion  Hx PE  -xarelto held for surgery  -resume when OK per [facility]   Best practice:  Diet: NPO Pain/Anxiety/Delirium protocol (if indicated): ordered VAP protocol (if indicated): yes DVT prophylaxis: held, restart xarelto when cleared by surgery GI prophylaxis: PPI Glucose control: not indicated  Mobility: bedrest Code Status: full Family Communication: Will update sister on arrival 10/27 Disposition: ICU  Labs   CBC: Recent Labs  Lab 08/30/19 2339  08/31/19 0321 09/01/19 0309 09/02/19 0407 09/03/19 0321 09/04/19 0307 09/05/19 0142  WBC 12.3*  --  14.8* 16.4* 16.3* 15.3* 14.7* 28.1*  NEUTROABS 9.1*  --  13.9* 14.9*  --   --   --   --   HGB 14.2   < > 14.1 12.7* 12.5* 13.6 13.6 14.5  HCT 43.6   < > 44.1 38.9* 38.2* 41.5 42.0 44.1  MCV 95.4  --  96.7 97.0 97.0 97.2 97.4 96.9  PLT 408*  --  351 357 338 389 374 367   < > = values in this interval not displayed.    Basic  Metabolic Panel: Recent Labs  Lab 09/01/19 0309 09/02/19 0407 09/03/19 0321 09/04/19 0307 09/05/19 0142  NA 131* 132* 134* 136 135  K 4.5 4.4 4.0 3.8 3.7  CL 99 96* 95* 97* 95*  CO2 23 26 28 29 28   GLUCOSE 140* 135* 150* 153* 153*  BUN 17 24* 28* 29* 28*  CREATININE 0.85 0.85 1.14 1.06 1.00  CALCIUM 8.7* 8.5* 8.7* 8.6* 8.4*  MG  --  2.2 2.3 2.1 2.1   GFR: Estimated Creatinine Clearance: 90.4 mL/min (by C-G formula based on SCr of 1 mg/dL). Recent Labs  Lab 09/02/19 0407 09/03/19 0321 09/04/19 0307 09/05/19 0142  WBC 16.3* 15.3* 14.7* 28.1*    Liver Function Tests: Recent Labs  Lab 08/31/19 0321 09/01/19 0309  AST 19 21  ALT 17 15  ALKPHOS 54 47  BILITOT 0.6 0.8  PROT 6.7 6.2*  ALBUMIN 3.7 3.3*   No results for input(s): LIPASE, AMYLASE in the last 168 hours. No results for input(s): AMMONIA in the last 168 hours.  ABG  Component Value Date/Time   PHART 7.497 (H) 09/04/2019 2255   PCO2ART 35.6 09/04/2019 2255   PO2ART 148 (H) 09/04/2019 2255   HCO3 27.3 09/04/2019 2255   TCO2 25 08/31/2019 0014   ACIDBASEDEF 3.7 (H) 01/10/2014 1244   O2SAT 95.6 01/10/2014 1244     Coagulation Profile: No results for input(s): INR, PROTIME in the last 168 hours.  Cardiac Enzymes: No results for input(s): CKTOTAL, CKMB, CKMBINDEX, TROPONINI in the last 168 hours.  HbA1C: Hemoglobin A1C  Date/Time Value Ref Range Status  07/27/2017 5.2  Final   Hgb A1c MFr Bld  Date/Time Value Ref Range Status  08/18/2018 11:44 AM 5.6 4.6 - 6.5 % Final    Comment:    Glycemic Control Guidelines for People with Diabetes:Non Diabetic:  <6%Goal of Therapy: <7%Additional Action Suggested:  >8%   04/30/2016 09:09 AM 5.8 4.6 - 6.5 % Final    Comment:    Glycemic Control Guidelines for People with Diabetes:Non Diabetic:  <6%Goal of Therapy: <7%Additional Action Suggested:  >8%     CBG: Recent Labs  Lab 09/01/19 0752 09/02/19 0745 09/03/19 0753 09/04/19 0953 09/05/19 0819   GLUCAP 142* 124* 130* 240* 135*    Critical care time: 35 minutes      Canary BrimBrandi Dajon Lazar, NP-C [facility] 09/05/2019, 8:50 AM

## 2019-09-05 NOTE — Progress Notes (Signed)
eLink Physician-Brief Progress Note Patient Name: [name] DOB: [birth year] MRN: [id number]   Date of Service  09/05/2019  HPI/Events of Note  Notified of need for stress ulcer prophylaxis.   eICU Interventions  Will order Protonix IV.      Intervention Category Intermediate Interventions: Best-practice therapies (e.g. DVT, beta blocker, etc.)  Kenna Kirn Eugene 09/05/2019, 5:31 AM

## 2019-09-05 NOTE — Progress Notes (Signed)
Theodore [name] [id number] 1956-04-24  CARE TEAM:  PCP: Biagio Borg, MD  Outpatient Care Team: Patient Care Team: Biagio Borg, MD as PCP - General (Internal Medicine) Wardell Honour, MD as Referring Physician (Family Medicine) Danis, Kirke Corin, MD as Consulting Physician (Gastroenterology) Juanito Doom, MD as Consulting Physician (Pulmonary Disease)  Inpatient Treatment Team: Treatment Team: Attending Provider: Collier Bullock, MD; Rounding Team: Redmond Baseman, MD; Utilization Review: Orlean Bradford, RN; Registered Nurse: Petra Kuba, RN; Consulting Physician: Edison Pace, Md, MD; Consulting Physician: Alphonsa Overall, MD; Rounding Team: Pccm, Md, MD   Problem List:   Principal Problem:   COPD with acute exacerbation [facility]) Active Problems:   Recurrent pulmonary emboli (HCC)   COPD GOLD IV    COPD exacerbation ([place])   Chronic respiratory failure assoc with cor pulmonale   CAD (coronary artery disease)   DOE (dyspnea on exertion)   Chronic anticoagulation   Chronic respiratory failure with hypoxia ([place])   Hyponatremia   Acute on chronic respiratory failure with hypoxia ([place])   Pneumoperitoneum   Current chronic use of systemic steroids   Pressure injury of skin   1 Day Post-Op  09/04/2019  PREOP DIAGNOSIS:  Pneumoperitoneum  POSTOP DIAGNOSIS:   Pneumoperitoneum, negative abdominal exploration  PROCEDURE:   EXPLORATORY LAPAROTOMY  SURGEON:   Alphonsa Overall, M.D.  ASSISTANT:   P. Marlou Starks, M.D.  "This was a negative laparotomy for an explanation for the pneumoperitoneum.  And there was no evidence of obvious bowel injury."  Assessment  Fair but stable  [facility] Stay = 5 days)  Plan:  Nasogastric tube decompression for presumed ileus for now.  Once extubated can do clamping trials.  No evidence of any intra-abdominal pathology to explain pneumoperitoneum.  Suspect this is one of his pulmonary blebs that had migrated through his mediastinum or  abdominal wall to give him that.  Follow expectantly for now.  No need for antibiotics from a surgical standpoint.  Oozing at his incision not surprising given his need for chronic anticoagulation for his recurrent pulmonary emboli.  Okay to fully anticoagulate if needed.  Hopefully extubate soon.  Challenging in a patient with steroid-dependent COPD.  VTE prophylaxis- SCDs, etc  Mobilize as tolerated to help recovery  20 minutes spent in review, evaluation, examination, counseling, and coordination of care.  More than 50% of that time was spent in counseling.  Discussed with critical care nursing and respiratory therapy.  Discussed with nurse practitioner with critical care team.  09/05/2019    Subjective: (Chief complaint)  Negative ex lap done - d/w Dr Lucia Gaskins.    Patient on minimal vent settings.  Respiratory therapy and critical care attempting weaning trial this morning to see if extubated will.  Some oozing at his initial incision.  More stable now.  Objective:  Vital signs:  Vitals:   09/05/19 0500 09/05/19 0530 09/05/19 0600 09/05/19 0700  BP: 116/75 100/63 105/72 (!) 112/55  Pulse: (!) 136 100 99 (!) 122  Resp: (!) 23 14 14 20   Temp:      TempSrc:      SpO2: 94% 90% 96% 93%  Weight:      Height:        Last BM Date: 08/31/19  Intake/Output   Yesterday:  10/26 0701 - 10/27 0700 In: 2866.8 [P.O.:360; I.V.:922.3; IV Piggyback:1584.5] Out: 2785 [Urine:2735; Blood:50] This shift:  No intake/output data recorded.  Bowel function:  Flatus: No  BM:  No  NGT: Bilious -thin with  scant output   Physical Exam:  General: Pt intubated.  Mildly agitated in mild acute distress Eyes: PERRL, normal EOM.  Sclera clear.  No icterus Neuro: CN II-XII intact w/o focal sensory/motor deficits. Lymph: No head/neck/groin lymphadenopathy Psych:  No agitation nor delirium  HENT: Normocephalic, Mucus membranes moist.  No thrush.  NG tube and ET tube in place Neck: Supple,  No tracheal deviation Chest: Distant breath sounds but no major wheezing or rales.  Good excursion CV:  Pulses intact.  Regular rhythm MS: Normal AROM mjr joints.  No obvious deformity  Abdomen: Soft.  Moderately distended.  Mildly tender at incisions only.  No evidence of peritonitis.  No incarcerated hernias.  Ext: Bilateral lower extremity Ace wraps feet to knees.  Left in place.  2+ edema.  No cyanosis Skin: No petechiae / purpura  Results:   Cultures: Recent Results (from the past 720 hour(s))  SARS Coronavirus 2 by RT PCR (hospital order, performed in [facility] hospital lab) Nasopharyngeal Nasopharyngeal Swab     Status: None   Collection Time: 08/30/19 11:40 PM   Specimen: Nasopharyngeal Swab  Result Value Ref Range Status   SARS Coronavirus 2 NEGATIVE NEGATIVE Final    Comment: (NOTE) If result is NEGATIVE SARS-CoV-2 target nucleic acids are NOT DETECTED. The SARS-CoV-2 RNA is generally detectable in upper and lower  respiratory specimens during the acute phase of infection. The lowest  concentration of SARS-CoV-2 viral copies this assay can detect is 250  copies / mL. A negative result does not preclude SARS-CoV-2 infection  and should not be used as the sole basis for treatment or other  patient management decisions.  A negative result may occur with  improper specimen collection / handling, submission of specimen other  than nasopharyngeal swab, presence of viral mutation(s) within the  areas targeted by this assay, and inadequate number of viral copies  (<250 copies / mL). A negative result must be combined with clinical  observations, patient history, and epidemiological information. If result is POSITIVE SARS-CoV-2 target nucleic acids are DETECTED. The SARS-CoV-2 RNA is generally detectable in upper and lower  respiratory specimens dur ing the acute phase of infection.  Positive  results are indicative of active infection with SARS-CoV-2.  Clinical  correlation  with patient history and other diagnostic information is  necessary to determine patient infection status.  Positive results do  not rule out bacterial infection or co-infection with other viruses. If result is PRESUMPTIVE POSTIVE SARS-CoV-2 nucleic acids MAY BE PRESENT.   A presumptive positive result was obtained on the submitted specimen  and confirmed on repeat testing.  While 2019 novel coronavirus  (SARS-CoV-2) nucleic acids may be present in the submitted sample  additional confirmatory testing may be necessary for epidemiological  and / or clinical management purposes  to differentiate between  SARS-CoV-2 and other Sarbecovirus currently known to infect humans.  If clinically indicated additional testing with an alternate test  methodology [phone]) is advised. The SARS-CoV-2 RNA is generally  detectable in upper and lower respiratory sp ecimens during the acute  phase of infection. The expected result is Negative. Fact Sheet for Patients:  BoilerBrush.com.cy Fact Sheet for Healthcare Providers: https://pope.com/ This test is not yet approved or cleared by the [place] FDA and has been authorized for detection and/or diagnosis of SARS-CoV-2 by FDA under an Emergency Use Authorization (EUA).  This EUA will remain in effect (meaning this test can be used) for the duration of the COVID-19 declaration under Section  564(b)(1) of the Act, 21 U.S.C. section 360bbb-3(b)(1), unless the authorization is terminated or revoked sooner. Performed at [facility], [address] [place], [place] [address]   Surgical PCR screen     Status: None   Collection Time: 09/04/19  7:51 PM   Specimen: Nasal Mucosa; Nasal Swab  Result Value Ref Range Status   MRSA, PCR NEGATIVE NEGATIVE Final   Staphylococcus aureus NEGATIVE NEGATIVE Final    Comment: (NOTE) The Xpert SA Assay (FDA approved for NASAL specimens in patients  33 years of age and older), is one component of a comprehensive surveillance program. It is not intended to diagnose infection nor to guide or monitor treatment. Performed at [facility], [address], [place], [place] [address]   MRSA PCR Screening     Status: None   Collection Time: 09/04/19 10:29 PM   Specimen: Nasal Mucosa; Nasopharyngeal  Result Value Ref Range Status   MRSA by PCR NEGATIVE NEGATIVE Final    Comment:        The GeneXpert MRSA Assay (FDA approved for NASAL specimens only), is one component of a comprehensive MRSA colonization surveillance program. It is not intended to diagnose MRSA infection nor to guide or monitor treatment for MRSA infections. Performed at [facility], [address] [place], [place] [address]     Labs: Results for orders placed or performed during the hospital encounter of 08/30/19 (from the past 48 hour(s))  Basic metabolic panel     Status: Abnormal   Collection Time: 09/04/19  3:07 AM  Result Value Ref Range   Sodium 136 135 - 145 mmol/L   Potassium 3.8 3.5 - 5.1 mmol/L   Chloride 97 (L) 98 - 111 mmol/L   CO2 29 22 - 32 mmol/L   Glucose, Bld 153 (H) 70 - 99 mg/dL   BUN 29 (H) 8 - 23 mg/dL   Creatinine, Ser 9.14 0.61 - 1.24 mg/dL   Calcium 8.6 (L) 8.9 - 10.3 mg/dL   GFR calc non Af Amer >60 >60 mL/min   GFR calc Af Amer >60 >60 mL/min   Anion gap 10 5 - 15    Comment: Performed at [facility], [address] [place], [place] [address]  CBC     Status: Abnormal   Collection Time: 09/04/19  3:07 AM  Result Value Ref Range   WBC 14.7 (H) 4.0 - 10.5 K/uL   RBC 4.31 4.22 - 5.81 MIL/uL   Hemoglobin 13.6 13.0 - 17.0 g/dL   HCT 62.1 30.8 - 65.7 %   MCV 97.4 80.0 - 100.0 fL   MCH 31.6 26.0 - 34.0 pg   MCHC 32.4 30.0 - 36.0 g/dL   RDW 84.6 96.2 - 95.2 %   Platelets 374 150 - 400 K/uL   nRBC 0.0 0.0 - 0.2 %    Comment: Performed at [facility],  [address], [place], [place] [address]  Magnesium     Status: None   Collection Time: 09/04/19  3:07 AM  Result Value Ref Range   Magnesium 2.1 1.7 - 2.4 mg/dL    Comment: Performed at [facility], [address] [place], [place] [address]  Glucose, capillary     Status: Abnormal   Collection Time: 09/04/19  9:53 AM  Result Value Ref Range   Glucose-Capillary 240 (H) 70 - 99 mg/dL  Type and screen [facility]     Status:  None   Collection Time: 09/04/19  7:51 PM  Result Value Ref Range   ABO/RH(D) A POS    Antibody Screen NEG    Sample Expiration      09/07/2019,2359 Performed at [facility], [address] [place], [place] [address]   Surgical PCR screen     Status: None   Collection Time: 09/04/19  7:51 PM   Specimen: Nasal Mucosa; Nasal Swab  Result Value Ref Range   MRSA, PCR NEGATIVE NEGATIVE   Staphylococcus aureus NEGATIVE NEGATIVE    Comment: (NOTE) The Xpert SA Assay (FDA approved for NASAL specimens in patients 35 years of age and older), is one component of a comprehensive surveillance program. It is not intended to diagnose infection nor to guide or monitor treatment. Performed at [facility], [address], [place], [place] [address]   ABO/Rh     Status: None   Collection Time: 09/04/19  7:51 PM  Result Value Ref Range   ABO/RH(D)      A POS Performed at [facility], [address], [place], [place] [address]   MRSA PCR Screening     Status: None   Collection Time: 09/04/19 10:29 PM   Specimen: Nasal Mucosa; Nasopharyngeal  Result Value Ref Range   MRSA by PCR NEGATIVE NEGATIVE    Comment:        The GeneXpert MRSA Assay (FDA approved for NASAL specimens only), is one component of a comprehensive MRSA colonization surveillance program. It is not intended to diagnose MRSA infection nor to guide or monitor treatment for MRSA infections. Performed  at [facility], [address], [place], [place] [address]   Blood gas, arterial     Status: Abnormal   Collection Time: 09/04/19 10:55 PM  Result Value Ref Range   FIO2 60.10    pH, Arterial 7.497 (H) 7.350 - 7.450   pCO2 arterial 35.6 32.0 - 48.0 mmHg   pO2, Arterial 148 (H) 83.0 - 108.0 mmHg   Bicarbonate 27.3 20.0 - 28.0 mmol/L   Patient temperature 98.6     Comment: Performed at [facility], [address] [place], [place] [address]  Triglycerides     Status: None   Collection Time: 09/04/19 11:39 PM  Result Value Ref Range   Triglycerides 127 <150 mg/dL    Comment: Performed at [facility], [address], [place], [place] [address]  Basic metabolic panel     Status: Abnormal   Collection Time: 09/05/19  1:42 AM  Result Value Ref Range   Sodium 135 135 - 145 mmol/L   Potassium 3.7 3.5 - 5.1 mmol/L   Chloride 95 (L) 98 - 111 mmol/L   CO2 28 22 - 32 mmol/L   Glucose, Bld 153 (H) 70 - 99 mg/dL   BUN 28 (H) 8 - 23 mg/dL   Creatinine, Ser 4.03 0.61 - 1.24 mg/dL   Calcium 8.4 (L) 8.9 - 10.3 mg/dL   GFR calc non Af Amer >60 >60 mL/min   GFR calc Af Amer >60 >60 mL/min   Anion gap 12 5 - 15    Comment: Performed at [facility], [address] [place], [place] [address]  CBC     Status: Abnormal   Collection Time: 09/05/19  1:42 AM  Result Value Ref Range   WBC 28.1 (H) 4.0 - 10.5 K/uL   RBC 4.55 4.22 - 5.81 MIL/uL   Hemoglobin 14.5  13.0 - 17.0 g/dL   HCT 28.444.1 13.239.0 - 44.052.0 %   MCV 96.9 80.0 - 100.0 fL   MCH 31.9 26.0 - 34.0 pg   MCHC 32.9 30.0 - 36.0 g/dL   RDW 10.213.5 72.511.5 - 36.615.5 %   Platelets 367 150 - 400 K/uL   nRBC 0.0 0.0 - 0.2 %    Comment: Performed at [facility], [address] [place], [place] [address]  Magnesium     Status: None   Collection Time: 09/05/19  1:42 AM  Result Value Ref Range   Magnesium 2.1 1.7 - 2.4 mg/dL    Comment: Performed at [facility], [address] [place], [place] [address]    Imaging / Studies: Ct Abdomen Pelvis W Contrast  Result Date: 09/04/2019 CLINICAL DATA:  New free air by plain radiography. COPD, hypoxia respiratory failure, CHF EXAM: CT ABDOMEN AND PELVIS WITH CONTRAST TECHNIQUE: Multidetector CT imaging of the abdomen and pelvis was performed using the standard protocol following bolus administration of intravenous contrast. CONTRAST:  100mL OMNIPAQUE IOHEXOL 300 MG/ML  SOLN COMPARISON:  None. FINDINGS: Lower chest: Basilar emphysema changes with hyperinflation. Normal heart size. No pericardial or pleural effusion. Aorta atherosclerotic. Hepatobiliary: No focal liver abnormality is seen. No gallstones, gallbladder wall thickening, or biliary dilatation. Pancreas: Unremarkable. No pancreatic ductal dilatation or surrounding inflammatory changes. Spleen: Normal in size without focal abnormality. Adrenals/Urinary Tract: Normal adrenal glands. Kidneys demonstrate parapelvic cysts bilaterally. No renal obstruction or hydronephrosis. No hydroureter or ureteral dilatation. Bladder is partially obscured by the left hip replacement hardware. Stomach/Bowel: Diffuse large volume of pneumoperitoneum throughout the abdomen compatible with perforated viscus. In the left mid abdomen, there 2 loops of dilated abnormal appearing small bowel with fecalization and what appears to be pneumatosis suspicious for ischemia. Stomach and duodenum are underdistended without surrounding inflammatory change or fluid. Moderate colonic stool burden. Normal appendix. Diverticulosis in the sigmoid region without acute inflammatory process or wall thickening. No fluid collection, abscess, hemorrhage, or ascites. Vascular/Lymphatic: Extensive calcific atherosclerosis of the aorta, iliac and abdominal vasculature. Mesenteric and renal vasculature do appear patent proximally. No veno-occlusive process.  No bulky adenopathy. Reproductive:  Unremarkable by CT. Other: Very small umbilical hernia containing fat and free air. No large ventral hernia. No inguinal hernia. Musculoskeletal: Degenerative changes of the spine. Previous left hip arthroplasty. No acute osseous finding. No compression fracture. IMPRESSION: Large volume of abdominopelvic pneumoperitoneum with dilated abnormal appearing small bowel in the left abdomen with fecalization and pneumatosis suspicious for ischemic small bowel. No significant free fluid, hemorrhage or abscess. These results were called by telephone at the time of interpretation on 09/04/2019 at 6:53 pm to provider Ovidio Kinavid Newman, who verbally acknowledged these results. Electronically Signed   By: Judie PetitM.  Shick M.D.   On: 09/04/2019 18:56   Dg Chest Port 1 View  Result Date: 09/05/2019 CLINICAL DATA:  Endotracheal tube position. EXAM: PORTABLE CHEST 1 VIEW COMPARISON:  August 30, 2019. FINDINGS: The heart size and mediastinal contours are within normal limits. Endotracheal and nasogastric tubes are in grossly good position. No pneumothorax is noted. No acute pulmonary disease is noted. The visualized skeletal structures are unremarkable. IMPRESSION: Endotracheal and nasogastric tubes are in grossly good position. No acute cardiopulmonary abnormality seen. Electronically Signed   By: Lupita RaiderJames  Green Jr M.D.   On: 09/05/2019 07:53   Dg Abd Acute 2+v W 1v Chest  Addendum Date: 09/04/2019   ADDENDUM REPORT: 09/04/2019 16:14 ADDENDUM: These results were called  by telephone at the time of interpretation on 09/04/2019 at 4:14 pm to provider [facility] PATEL , who verbally acknowledged these results. Electronically Signed   By: Katherine Mantle M.D.   On: 09/04/2019 16:14   Result Date: 09/04/2019 CLINICAL DATA:  Shortness of breath EXAM: DG ABDOMEN ACUTE W/ 1V CHEST COMPARISON:  September 02, 2019 FINDINGS: The lungs are hyperexpanded. There is no pneumothorax. Emphysematous changes are noted. There is an airspace opacity in the  right upper lobe. Pleuroparenchymal scarring is noted the lung apices. Heart size is stable. Scarring versus atelectasis is noted at the lung bases. Evaluation of the abdomen demonstrates pneumoperitoneum. There are dilated loops of small bowel measuring up to approximately 5 cm. There is a large amount of stool in the right hemicolon. The patient is status post total hip arthroplasty on the left. Degenerative changes are noted throughout the lumbar spine. IMPRESSION: 1. Moderate volume pneumoperitoneum concerning for hollow viscus perforation. 2. Dilated loops of small bowel concerning for small bowel obstruction. 3. Right upper lobe airspace opacity. This could represent atelectasis or developing infiltrate. A pulmonary nodule is not entirely excluded. A 4-6 week follow-up two-view chest x-ray is recommended to confirm resolution of this finding. 4. Large amount of stool in the right hemicolon. Electronically Signed: By: Katherine Mantle M.D. On: 09/04/2019 16:05    Medications / Allergies: per chart  Antibiotics: Anti-infectives (From admission, onward)   Start     Dose/Rate Route Frequency Ordered Stop   09/04/19 1800  fluconazole (DIFLUCAN) IVPB 100 mg     100 mg 50 mL/hr over 60 Minutes Intravenous Every 24 hours 09/04/19 1626     09/04/19 1800  piperacillin-tazobactam (ZOSYN) IVPB 3.375 g     3.375 g 12.5 mL/hr over 240 Minutes Intravenous Every 8 hours 09/04/19 1644     09/02/19 1000  doxycycline (VIBRA-TABS) tablet 100 mg  Status:  Discontinued     100 mg Oral Every 12 hours 09/02/19 0941 09/04/19 1627   08/31/19 0245  azithromycin (ZITHROMAX) 500 mg in sodium chloride 0.9 % 250 mL IVPB  Status:  Discontinued     500 mg 250 mL/hr over 60 Minutes Intravenous Daily at bedtime 08/31/19 0239 09/02/19 0941   08/31/19 0130  cefTRIAXone (ROCEPHIN) 1 g in sodium chloride 0.9 % 100 mL IVPB     1 g 200 mL/hr over 30 Minutes Intravenous  Once 08/31/19 0121 08/31/19 0255         Note: Portions of this report may have been transcribed using voice recognition software. Every effort was made to ensure accuracy; however, inadvertent computerized transcription errors may be present.   Any transcriptional errors that result from this process are unintentional.     Ardeth Sportsman, MD, FACS, MASCRS Gastrointestinal and Minimally Invasive Surgery    [address], [address] [place], [place] [address] [phone] Main / Paging [phone] Fax

## 2019-09-05 NOTE — Progress Notes (Signed)
RN noticed that pt becomes hypotensive when ordered PRN fentanyl (100 mcg) is given for severe pain. RN consulted with Elink to ask if PRN dose could be lowered to treat pain without dropping BP. New orders received to give pt 1L bolus of NS and PRN fentanyl dose was adjusted. Will continue to monitor pt.

## 2019-09-05 NOTE — Progress Notes (Signed)
Initial Nutrition Assessment  DOCUMENTATION CODES:   Not applicable  INTERVENTION:  - if patient unable to be extubated 10/28 and TF feasible at that time, recommend Vital High Protein @ 60 ml/hr with 30 ml prostat once/day. - this regimen + kcal from current propofol rate will provide 2200 kcal (98% estimated kcal need), 141 grams protein, and 1204 ml free water.    NUTRITION DIAGNOSIS:   Inadequate oral intake related to inability to eat as evidenced by NPO status  GOAL:   Patient will meet greater than or equal to 90% of their needs  MONITOR:   Vent status, Labs, Weight trends, Skin  REASON FOR ASSESSMENT:   Ventilator  ASSESSMENT:   63 y.o. male with medical history significant for steroid-dependent COPD, chronic hypoxic respiratory failure, CAD, chronic leg swelling, and history of PE on Xarelto. He presented to the ED for SOB, increased exertional dyspnea for 2 days, increased productive cough and wheezing, and was unable to recover after becoming acutely dyspneic with mild exertion. CXR notable for chronic hyperinflation and emphysema without acute findings.  Patient was previously on Heart Healthy diet 10/22-10/26 and flow sheet indicates 50-100% intakes during that time. Patient is currently POD #1 ex lap for pneumoperitoneum.  Able to talk briefly with RN concerning patient. Attempt this AM to wean was unsuccessful so patient is very unlikely to be extubated today. She reports patient had some belly breathing earlier in the morning.  Current weight is 227 lb, weight on 8/26 was 225 lb, and weight on 01/27/19 was 221 lb.   Per notes: - pneumoperitoneum s/p ex lap with no acute findings and remains intubated post-op - acute on chronic hypoxemic respiratory failure, acute COPD exacerbation - afib--new onset night of 10/26-10/27 - keep NGT to LIS today   Patient is currently intubated on ventilator support MV: 8.7 L/min Temp (24hrs), Avg:98.3 F (36.8 C), Min:96.6  F (35.9 C), Max:100.4 F (38 C) Propofol: 25 ml/hr (660 kcal) BP: 109/64 and MAP: 78   Labs reviewed; CBG: 135 mg/dl, Cl: 95 mmol/l, BUN: 28 mg/dl, Ca: 8.4 mg/dl. Medications reviewed; 10 mg rectal dulcolax/day, 40 mg solu-medrol BID, 40 mg IV protonix/day.  Drip; propofol @ 40 mcg/kg/min.    NUTRITION - FOCUSED PHYSICAL EXAM:  completed; no muscle and no fat wasting.   Diet Order:   Diet Order            Diet NPO time specified Except for: Ice Chips  Diet effective now              EDUCATION NEEDS:   No education needs have been identified at this time  Skin:  Skin Assessment: Skin Integrity Issues: Skin Integrity Issues:: Stage II, Incisions Stage II: L buttocks and sacrum Incisions: abdomen (10/26)  Last BM:  10/22  Height:   Ht Readings from Last 1 Encounters:  09/04/19 6\' 4"  (1.93 m)    Weight:   Wt Readings from Last 1 Encounters:  09/04/19 103 kg    Ideal Body Weight:  91.8 kg  BMI:  Body mass index is 27.64 kg/m.  Estimated Nutritional Needs:   Kcal:  2247 kcal  Protein:  124-154 grams  Fluid:  >/= 2.2 L/day      Jarome Matin, MS, RD, LDN, [facility] Inpatient Clinical Dietitian Pager # [phone] After hours/weekend pager # [phone]

## 2019-09-05 NOTE — Progress Notes (Signed)
ANTICOAGULATION CONSULT NOTE   Pharmacy Consult for lovenox Indication: hx pulmonary embolus; new onset afib  Allergies  Allergen Reactions  . Naproxen Sodium Itching    Patient Measurements: Height: 6\' 4"  (193 cm) Weight: 227 lb 1.2 oz (103 kg) IBW/kg (Calculated) : 86.8 Heparin Dosing Weight:   Vital Signs: Temp: 100.4 F (38 C) (10/27 0820) Temp Source: Oral (10/27 0820) BP: 112/55 (10/27 0700) Pulse Rate: 122 (10/27 0700)  Labs: Recent Labs    09/03/19 0321 09/04/19 0307 09/05/19 0142  HGB 13.6 13.6 14.5  HCT 41.5 42.0 44.1  PLT 389 374 367  CREATININE 1.14 1.06 1.00    Estimated Creatinine Clearance: 90.4 mL/min (by C-G formula based on SCr of 1 mg/dL).   Medications:  - on xarelto 20 mg daily PTA  Assessment: Patient's a 63 y.o M with hx PE on xarelto PTA presented to the ED on 10/21 with SOB.  She was found to have pneumoperitoneum and underwent expl lap on 10/26.  He remained intubated after the procedure was transferred to the ICU.  He was found to have new onset afib on 10/27. Pharmacy is consulted to transition xarelto to lovenox on 10/27.  Today, 09/05/2019: - cbc stable - no bleeding documented - crcl>30 - last dose of xarelto 20 mg given on 10/26 at 0924  Goal of Therapy:  Anti-Xa level 0.6-1 units/ml 4hrs after LMWH dose given Monitor platelets by anticoagulation protocol: Yes   Plan:  - start lovenox 100 mg SQ q12h - cbc daily ordered by MD through 10/28 - monitor for s/s bleeding  Lillia Lengel P 09/05/2019,10:31 AM

## 2019-09-05 NOTE — Progress Notes (Addendum)
At 0000, RN noticed a moderate amount of sanguinous drainage underneath the midline incision's postop dressing. Dressing was no longer intact. Dr Lucia Gaskins was notified by RN and received verbal orders to change to a pressure dressing with 4x4 gauze secured by tape. RN will continue to monitor midline incision for bleeding.

## 2019-09-05 NOTE — Progress Notes (Signed)
RN noticed pt becoming hypotensive at 0340 with MAPs sustaining in the low 60s. RN repositioned BP cuff, and turned down sedation, but BP remained low. Elink and CCM were notified, and orders for a 500cc bolus were placed by Dr. Duwayne Heck. RN will continue to assess and monitor pt response.

## 2019-09-05 NOTE — Progress Notes (Signed)
eLink Physician-Brief Progress Note Patient Name: Theodore [name] DOB: Nov 06, 1956 MRN: [id number]   Date of Service  09/05/2019  HPI/Events of Note  Hypotension - BP drops with Fentanyl 100 mcg IV for pain. LVEF = 65-70%. Request to decrease Fentanyl dose.   eICU Interventions  Will order: 1. Fentanyl 25-100 mcg IV Q 2 hours PRN pain. 2. Bolus with 0.9 NaCl 1 liter IV over 1 hour now.      Intervention Category Major Interventions: Hypotension - evaluation and management  Sommer,Steven Eugene 09/05/2019, 5:52 AM

## 2019-09-06 ENCOUNTER — Inpatient Hospital Stay (HOSPITAL_COMMUNITY): Payer: PPO

## 2019-09-06 DIAGNOSIS — J441 Chronic obstructive pulmonary disease with (acute) exacerbation: Secondary | ICD-10-CM | POA: Diagnosis not present

## 2019-09-06 LAB — BASIC METABOLIC PANEL
Anion gap: 11 (ref 5–15)
BUN: 31 mg/dL — ABNORMAL HIGH (ref 8–23)
CO2: 27 mmol/L (ref 22–32)
Calcium: 7.9 mg/dL — ABNORMAL LOW (ref 8.9–10.3)
Chloride: 98 mmol/L (ref 98–111)
Creatinine, Ser: 1.23 mg/dL (ref 0.61–1.24)
GFR calc Af Amer: >60 mL/min (ref >60)
GFR calc non Af Amer: >60 mL/min (ref >60)
Glucose, Bld: 146 mg/dL — ABNORMAL HIGH (ref 70–99)
Potassium: 3.4 mmol/L — ABNORMAL LOW (ref 3.5–5.1)
Sodium: 136 mmol/L (ref 135–145)

## 2019-09-06 LAB — CBC
HCT: 43.7 % (ref 39.0–52.0)
Hemoglobin: 14 g/dL (ref 13.0–17.0)
MCH: 31.5 pg (ref 26.0–34.0)
MCHC: 32 g/dL (ref 30.0–36.0)
MCV: 98.4 fL (ref 80.0–100.0)
Platelets: 280 10*3/uL (ref 150–400)
RBC: 4.44 MIL/uL (ref 4.22–5.81)
RDW: 13.9 % (ref 11.5–15.5)
WBC: 20.4 10*3/uL — ABNORMAL HIGH (ref 4.0–10.5)
nRBC: 0 % (ref 0.0–0.2)

## 2019-09-06 LAB — GLUCOSE, CAPILLARY: Glucose-Capillary: 116 mg/dL — ABNORMAL HIGH (ref 70–99)

## 2019-09-06 LAB — MAGNESIUM: Magnesium: 2.3 mg/dL (ref 1.7–2.4)

## 2019-09-06 MED ORDER — CHLORHEXIDINE GLUCONATE 0.12 % MT SOLN
15.0000 mL | Freq: Two times a day (BID) | OROMUCOSAL | Status: DC
  Administered 2019-09-07 – 2019-09-18 (×23): 15 mL via OROMUCOSAL
  Filled 2019-09-06 (×18): qty 15

## 2019-09-06 MED ORDER — SODIUM CHLORIDE 0.9 % IV SOLN
INTRAVENOUS | Status: DC
  Administered 2019-09-07 (×2): via INTRAVENOUS

## 2019-09-06 MED ORDER — SODIUM CHLORIDE 3 % IN NEBU
4.0000 mL | INHALATION_SOLUTION | Freq: Two times a day (BID) | RESPIRATORY_TRACT | Status: AC
  Administered 2019-09-06 – 2019-09-08 (×5): 4 mL via RESPIRATORY_TRACT
  Filled 2019-09-06 (×5): qty 4

## 2019-09-06 MED ORDER — CLONAZEPAM 0.125 MG PO TBDP
0.2500 mg | ORAL_TABLET | Freq: Two times a day (BID) | ORAL | Status: DC | PRN
  Administered 2019-09-06 – 2019-09-17 (×15): 0.25 mg via ORAL
  Filled 2019-09-06 (×16): qty 2

## 2019-09-06 MED ORDER — ORAL CARE MOUTH RINSE
15.0000 mL | Freq: Two times a day (BID) | OROMUCOSAL | Status: DC
  Administered 2019-09-07 – 2019-09-18 (×19): 15 mL via OROMUCOSAL

## 2019-09-06 MED ORDER — CLONAZEPAM 0.5 MG PO TABS: 0.2500 mg | ORAL_TABLET | Freq: Two times a day (BID) | ORAL | Status: DC | PRN

## 2019-09-06 MED ORDER — FENTANYL CITRATE (PF) 100 MCG/2ML IJ SOLN
25.0000 ug | INTRAMUSCULAR | Status: DC | PRN
  Administered 2019-09-06 (×2): 25 ug via INTRAVENOUS
  Administered 2019-09-07: 50 ug via INTRAVENOUS
  Administered 2019-09-07: 25 ug via INTRAVENOUS
  Administered 2019-09-07: 50 ug via INTRAVENOUS
  Administered 2019-09-07 (×2): 25 ug via INTRAVENOUS
  Filled 2019-09-06 (×7): qty 2

## 2019-09-06 MED ORDER — POTASSIUM CHLORIDE 10 MEQ/100ML IV SOLN
10.0000 meq | INTRAVENOUS | Status: AC
  Administered 2019-09-06 (×2): 10 meq via INTRAVENOUS
  Filled 2019-09-06 (×2): qty 100

## 2019-09-06 MED ORDER — CALCIUM GLUCONATE-NACL 1-0.675 GM/50ML-% IV SOLN
1.0000 g | Freq: Once | INTRAVENOUS | Status: AC
  Administered 2019-09-06: 1000 mg via INTRAVENOUS
  Filled 2019-09-06: qty 50

## 2019-09-06 MED ORDER — SODIUM CHLORIDE 3 % IN NEBU
4.0000 mL | INHALATION_SOLUTION | Freq: Two times a day (BID) | RESPIRATORY_TRACT | Status: DC
  Administered 2019-09-06: 4 mL via RESPIRATORY_TRACT
  Filled 2019-09-06 (×2): qty 4

## 2019-09-06 NOTE — Progress Notes (Signed)
AM labs showed K 3.4 and Ca 7.9. Elink notified by RN; awaiting orders. RN will continue to monitor pt.

## 2019-09-06 NOTE — Progress Notes (Signed)
Physical Therapy Re-Evaluation Patient Details Name: Theodore [name] MRN: [id number] DOB: 1956/04/15 Today's Date: 09/06/2019    History of Present Illness 63 y.o. male with medical history significant for steroid-dependent COPD, chronic hypoxic respiratory failure, coronary artery disease, chronic leg swelling, and history of PE on Xarelto, now presenting to the emergency department for evaluation of shortness of breath. Dx: COPD exacerbation. S/P ex lap 10/26 due to pneumoperitoneum. Extubated 10/28.    PT Comments    Re-evaluation s/p ex lap 10/26 and extubation 10/28. Min-Mod Assist +2 for mobility. Moderate anxiety during session. HR as high as 140 bpm and O2 91% on 5L [place] O2 with activity. Will plan to follow and progress activity as tolerated. D/C recommendations have been updated at this time. End of session vitals: 93% 3L [place], HR 117 bpm.    Follow Up Recommendations  SNF vs Home health PT;Supervision/Assistance - 24 hour(depending on progress)     Equipment Recommendations  None recommended by PT    Recommendations for Other Services       Precautions / Restrictions Precautions Precautions: Fall Precaution Comments: baseline O2 dependent at night only--2L; monitor O2/HR; abdominal sg Restrictions Weight Bearing Restrictions: No    Mobility  Bed Mobility Overal bed mobility: Needs Assistance Bed Mobility: Supine to Sit;Rolling;Sit to Supine Rolling: Mod assist   Supine to sit: Mod assist;+2 for physical assistance;+2 for safety/equipment;HOB elevated Sit to supine: Mod assist;+2 for physical assistance;+2 for safety/equipment;HOB elevated   General bed mobility comments: Assist for trunk and bil LEs. Increased time. Pt relied on bedrail  Transfers Overall transfer level: Needs assistance Equipment used: Rolling walker (2 wheeled) Transfers: Sit to/from Stand Sit to Stand: Min assist;+2 physical assistance;+2 safety/equipment;From elevated surface          General transfer comment: Assist to rise, stabilize, control descent. VCs safety, technique, hand placement. Increased time.  Ambulation/Gait Ambulation/Gait assistance: Min assist;+2 physical assistance;+2 safety/equipment   Assistive device: Rolling walker (2 wheeled)       General Gait Details: 2-3 side steps along the side of the bed with RW for support.   Stairs             Wheelchair Mobility    Modified Rankin (Stroke Patients Only)       Balance Overall balance assessment: Needs assistance         Standing balance support: Bilateral upper extremity supported Standing balance-Leahy Scale: Poor                              Cognition Arousal/Alertness: Awake/alert Behavior During Therapy: WFL for tasks assessed/performed Overall Cognitive Status: Within Functional Limits for tasks assessed                                 General Comments: mildly anxious at times      Exercises      General Comments        Pertinent Vitals/Pain Pain Assessment: 0-10 Pain Score: 7  Pain Location: abdomen with coughing, movement Pain Descriptors / Indicators: Sore;Discomfort;Grimacing;Sharp Pain Intervention(s): Limited activity within patient's tolerance;Monitored during session;Repositioned    Home Living                      Prior Function            PT Goals (current goals can now be found in the care  plan section) Progress towards PT goals: Progressing toward goals    Frequency    Min 3X/week      PT Plan Current plan remains appropriate    Co-evaluation              AM-PAC PT "6 Clicks" Mobility   Outcome Measure  Help needed turning from your back to your side while in a flat bed without using bedrails?: A Lot Help needed moving from lying on your back to sitting on the side of a flat bed without using bedrails?: A Lot Help needed moving to and from a bed to a chair (including a wheelchair)?: A  Lot Help needed standing up from a chair using your arms (e.g., wheelchair or bedside chair)?: A Lot Help needed to walk in hospital room?: A Lot Help needed climbing 3-5 steps with a railing? : A Lot 6 Click Score: 12    End of Session   Activity Tolerance: Patient limited by fatigue;Patient limited by pain Patient left: in bed;with call bell/phone within reach;with bed alarm set   PT Visit Diagnosis: Pain;Difficulty in walking, not elsewhere classified (R26.2);Muscle weakness (generalized) (M62.81) Pain - part of body: (abdomen)     Time: 1340-1406 PT Time Calculation (min) (ACUTE ONLY): 26 min  Charges:  $Therapeutic Activity: 8-22 mins                        Weston Anna, PT Acute Rehabilitation Services Pager: [phone] Office: [phone]

## 2019-09-06 NOTE — Progress Notes (Signed)
eLink Physician-Brief Progress Note Patient Name: Theodore [name] DOB: 21-Feb-1956 MRN: [id number]   Date of Service  09/06/2019  HPI/Events of Note  k 3.4, Calcium 7.9 Creatinine 1.23 NPO due to recent surgery  eICU Interventions  K 10 meqs IV x 2 and calcium 1 gram IV ordered     Intervention Category Minor Interventions: Electrolytes abnormality - evaluation and management  Judd Lien 09/06/2019, 3:41 AM

## 2019-09-06 NOTE — Procedures (Signed)
Extubation Procedure Note  Patient Details:   Name: LADARRIAN ASENCIO DOB: 1956/04/28 MRN: [id number]   Airway Documentation:  Airway 7.5 mm (Active)  Secured at (cm) 24 cm 09/06/19 0730  Measured From Lips 09/06/19 [place] 09/06/19 0730  Secured By [employer] 09/06/19 0730  Tube Holder Repositioned Yes 09/06/19 0730  Cuff Pressure (cm H2O) 24 cm H2O 09/06/19 0730  Site Condition Dry 09/06/19 0400   Vent end date: (not recorded) Vent end time: (not recorded)   Evaluation  O2 sats: 94 Complications: large amount of thick secretions Patient tolerated procedure well. Bilateral Breath Sounds: Diminished   Pt able to speak  Per CCM order, pt extubated.  No complications other than copious amounts of thick secretions. Pt placed on 2L nasal cannula.  Martha Clan 09/06/2019, 8:59 AM

## 2019-09-06 NOTE — Progress Notes (Signed)
NAME:  Theodore [name], MRN:  [id number], DOB:  1956-07-18, LOS: 6 ADMISSION DATE:  08/30/2019, CONSULTATION DATE:  10/26 REFERRING MD: Ovidio Kin, Lynden Oxford , CHIEF COMPLAINT:  Dyspnea, abdominal pain   Brief History   63 year old man with 2-3L O2 dependent COPD, CAD, PE who presented 10/21 with acute dyspnea thought secondary to AECOPD.  He developed abdominal pain & was found to have pneumoperitoneum on 10/26.  Subsequently was taken for exploratory laparotomy for possible pneumoperitoneum and ischemic small bowel. However, no underlying cause for pneumoperitoneum found. Remained intubated post-operatively, to ICU.  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  To OR with no source found. AF after surgery. 10/26 Ex-lap >> "he had no evidence of ischemia, perforation, or abnormal changes of his small bowel.  He did had a bolus of food with undigested food particles that bulged in the small bowel, and I suspect, that this is what gave the impression of pneumatosis in the small bowel."   10/27 Bronchospastic on vent, failed wean   Consults:  Surgery   Procedures:    Significant Diagnostic Tests:   ECHO 10/22 >> LVEF 65-70%, hyperdynamic function, no LVH, RV mildly enlarged, global RV function normal, LA normal, RA normal  ABD CXR 10/26 >> moderate volume pneumoperitoneum concerning for hollow viscus perforation, dilated loops of small bowel concerning for SBO, RUL airspace opacity, large amt stool in right hemicolon  CT ABD 10/26 >> large volume of abdominopelvic pneumoperitoneum with  dilated abnormal appearing small bowel in the left abdomen with fecalization and pneumatosis suspicious for ischemic small bowel, no significant free fluid, hemorrhage or abscess  Micro Data:  COVID 10/21 >> negative  MRSA PCR 10/26 >> negative   Antimicrobials:  Ceftriaxone x1 10/21  Azithromycin 10/21 >> 10/23  Doxycycline 1024 >> 10/26 Fluconazole 10/26 >> 10/27 Zosyn 10/26 >> 10/28  Interim history/subjective:  RN reports sedation off. Pt turned to PSV 8/5.  No acute events overnight. NGT not draining, surgery assessing, pending KUB.    Objective   Blood pressure 106/77, pulse (!) 103, temperature 98.5 F (36.9 C), temperature source Oral, resp. rate 19, height 6\' 4"  (1.93 m), weight 103 kg, SpO2 97 %.    Vent Mode: PRVC FiO2 (%):  [40 %] 40 % Set Rate:  [14 bmp] 14 bmp Vt Set:  [690 mL] 690 mL PEEP:  [5 cmH20] 5 cmH20 Pressure Support:  [5 cmH20] 5 cmH20 Plateau Pressure:  [17 cmH20-28 cmH20] 24 cmH20   Intake/Output Summary (Last 24 hours) at 09/06/2019 0807 Last data filed at 09/06/2019 0600 Gross per 24 hour  Intake 944.95 ml  Output 625 ml  Net 319.95 ml   Filed Weights   08/30/19 2331 09/04/19 2316  Weight: 104.3 kg 103 kg    Examination: General: elderly gentleman lying in bed on vent in NAD HEENT: MM pink/moist, ETT, anicteric, no jvd Neuro: Awakens, alert, nods yes/no to questions, follows commands, MAE CV: s1s2 irr irr, AF on monitor, no m/r/g PULM:  Non-labored, significant improvement in breath sounds, good air movement without wheeze GI: protuberant, bsx4 hypoactive, NGT in place, midline abd  incision with small amt blood on dressing  Extremities: warm/dry, BLE edema / unna boots in place  Skin: no rashes or lesions   Resolved Hospital Problem list     Assessment & Plan:   Pneumoperitoneum  S/p ex lap for pneumoperitoneum without acute findings.  -post-operative care per CCS, appreciate assistance  -continue NGT -afebrile, WBC improved,  zosyn D2 / D8 abx.  Consider stopping.  -follow abdominal exam    Acute on Chronic Hypoxemic Respiratory Failure Acute Exacerbation of COPD  In setting of obstructive lung disease with acute exacerbation, 2L O2 dependent at baseline -PRVC 8cc/kg as rest mode -wean PEEP / FiO2 for sats 88-94% -PSV wean with goal for extubation  -hold sedation with WUA  -continue solumedrol 40 mg BID  -continue Brovana + Pulmicort BID  -Duoneb Q6   Atrial Fibrillation  New onset overnight 10/27 -ICU/tele monitoring  -PRN lopressor for rate control  -lovenox full dose started 10/27 pm  Rising Sr Cr  -hold lasix 10/28 -NS at 40 ml/hr -Trend BMP / urinary output -Replace electrolytes as indicated -Avoid nephrotoxic agents, ensure adequate renal perfusion  Hx PE  -hold xarelto, on full dose lovenox  -ok for anticoagulation per CCS   Best practice:  Diet: NPO Pain/Anxiety/Delirium protocol (if indicated): ordered VAP protocol (if indicated): yes DVT prophylaxis: lovenox full dose  GI prophylaxis: PPI BID Glucose control: not indicated  Mobility: bedrest Code Status: full Family Communication: Sister Thayer Headings) called for update 10/28.  Reviewed plan of care.  Disposition: ICU  Labs   CBC: Recent Labs  Lab 08/30/19 2339  08/31/19 0321 09/01/19 0309 09/02/19 0407 09/03/19 0321 09/04/19 0307 09/05/19 0142 09/06/19 0144  WBC 12.3*  --  14.8* 16.4* 16.3* 15.3* 14.7* 28.1* 20.4*  NEUTROABS 9.1*  --  13.9* 14.9*  --   --   --   --   --   HGB 14.2   < > 14.1 12.7* 12.5* 13.6 13.6 14.5 14.0  HCT 43.6   < > 44.1 38.9* 38.2* 41.5 42.0 44.1 43.7  MCV 95.4  --  96.7 97.0 97.0 97.2 97.4 96.9 98.4  PLT 408*  --  351 357 338 389 374 367 280   < > = values in this interval not displayed.    Basic Metabolic Panel: Recent Labs  Lab 09/02/19 0407 09/03/19 0321 09/04/19 0307 09/05/19 0142 09/06/19 0144  NA 132* 134* 136 135 136  K 4.4 4.0 3.8 3.7 3.4*  CL 96* 95* 97* 95* 98  CO2 26 28 29  28 27   GLUCOSE 135* 150* 153* 153* 146*  BUN 24* 28* 29* 28* 31*  CREATININE 0.85 1.14 1.06 1.00 1.23  CALCIUM 8.5* 8.7* 8.6* 8.4* 7.9*  MG 2.2 2.3 2.1 2.1 2.3   GFR: Estimated Creatinine Clearance: 73.5 mL/min (by C-G formula based on SCr of 1.23 mg/dL). Recent Labs  Lab 09/03/19 0321 09/04/19 0307 09/05/19 0142 09/06/19 0144  WBC 15.3* 14.7* 28.1* 20.4*    Liver Function Tests: Recent Labs  Lab 08/31/19 0321 09/01/19 0309  AST 19 21  ALT 17 15  ALKPHOS 54 47  BILITOT 0.6 0.8  PROT 6.7 6.2*  ALBUMIN 3.7 3.3*   No results for input(s): LIPASE, AMYLASE in the last 168 hours. No results for input(s): AMMONIA in the last 168 hours.  ABG    Component Value Date/Time   PHART 7.497 (H) 09/04/2019 2255   PCO2ART 35.6 09/04/2019 2255   PO2ART 148 (H) 09/04/2019 2255   HCO3 27.3  09/04/2019 2255   TCO2 25 08/31/2019 0014   ACIDBASEDEF 3.7 (H) 01/10/2014 1244   O2SAT 95.6 01/10/2014 1244     Coagulation Profile: No results for input(s): INR, PROTIME in the last 168 hours.  Cardiac Enzymes: No results for input(s): CKTOTAL, CKMB, CKMBINDEX, TROPONINI in the last 168 hours.  HbA1C: Hemoglobin A1C  Date/Time Value Ref Range Status  07/27/2017 5.2  Final   Hgb A1c MFr Bld  Date/Time Value Ref Range Status  08/18/2018 11:44 AM 5.6 4.6 - 6.5 % Final    Comment:    Glycemic Control Guidelines for People with Diabetes:Non Diabetic:  <6%Goal of Therapy: <7%Additional Action Suggested:  >8%   04/30/2016 09:09 AM 5.8 4.6 - 6.5 % Final    Comment:    Glycemic Control Guidelines for People with Diabetes:Non Diabetic:  <6%Goal of Therapy: <7%Additional Action Suggested:  >8%     CBG: Recent Labs  Lab 09/02/19 0745 09/03/19 0753 09/04/19 0953 09/05/19 0819 09/06/19 0758  GLUCAP 124* 130* 240* 135* 116*    Critical care time: 35 minutes        Canary BrimBrandi Cherene Dobbins, NP-C [facility] 09/06/2019, 8:07 AM

## 2019-09-06 NOTE — Progress Notes (Signed)
[facility] Progress Note  2 Days Post-Op  Subjective: CC-  Awake on the vent. Working on weaning the vent today. He does complain of abdominal pain and bloating. Denies n/v. NG with no output. No flatus or BM.  Objective: Vital signs in last 24 hours: Temp:  [97.3 F (36.3 C)-100.4 F (38 C)] 98.5 F (36.9 C) (10/28 0400) Pulse Rate:  [86-109] 103 (10/28 0600) Resp:  [11-29] 19 (10/28 0600) BP: (79-126)/(57-88) 106/77 (10/28 0600) SpO2:  [93 %-100 %] 97 % (10/28 0730) FiO2 (%):  [40 %] 40 % (10/28 0730) Last BM Date: 08/31/19  Intake/Output from previous day: 10/27 0701 - 10/28 0700 In: 945 [I.V.:599.3; IV Piggyback:345.7] Out: 625 [Urine:625] Intake/Output this shift: No intake/output data recorded.   Vent settings: Vent Mode: PRVC FiO2 (%):  [40 %] 40 % Set Rate:  [14 bmp] 14 bmp Vt Set:  [690 mL] 690 mL PEEP:  [5 cmH20] 5 cmH20 Pressure Support:  [5 cmH20] 5 cmH20 Plateau Pressure:  [17 cmH20-28 cmH20] 24 cmH20   PE: Gen:  Alert, NAD HEENT: ETT in place Card:  Irregular Pulm:  Mechanically ventilated Abd: distended, mild diffuse tenderness, hypoactive bowel sounds, midline incision with some bloody drainage Skin: warm and dry  Lab Results:  Recent Labs    09/05/19 0142 09/06/19 0144  WBC 28.1* 20.4*  HGB 14.5 14.0  HCT 44.1 43.7  PLT 367 280   BMET Recent Labs    09/05/19 0142 09/06/19 0144  NA 135 136  K 3.7 3.4*  CL 95* 98  CO2 28 27  GLUCOSE 153* 146*  BUN 28* 31*  CREATININE 1.00 1.23  CALCIUM 8.4* 7.9*   PT/INR No results for input(s): LABPROT, INR in the last 72 hours. CMP     Component Value Date/Time   NA 136 09/06/2019 0144   NA 140 08/07/2017   K 3.4 (L) 09/06/2019 0144   CL 98 09/06/2019 0144   CO2 27 09/06/2019 0144   GLUCOSE 146 (H) 09/06/2019 0144   BUN 31 (H) 09/06/2019 0144   BUN 12 08/07/2017   CREATININE 1.23 09/06/2019 0144   CALCIUM 7.9 (L) 09/06/2019 0144   PROT 6.2 (L) 09/01/2019 0309    ALBUMIN 3.3 (L) 09/01/2019 0309   AST 21 09/01/2019 0309   ALT 15 09/01/2019 0309   ALKPHOS 47 09/01/2019 0309   BILITOT 0.8 09/01/2019 0309   GFRNONAA >60 09/06/2019 0144   GFRAA >60 09/06/2019 0144   Lipase     Component Value Date/Time   LIPASE 20 02/03/2012 2022       Studies/Results: Ct Abdomen Pelvis W Contrast  Result Date: 09/04/2019 CLINICAL DATA:  New free air by plain radiography. COPD, hypoxia respiratory failure, CHF EXAM: CT ABDOMEN AND PELVIS WITH CONTRAST TECHNIQUE: Multidetector CT imaging of the abdomen and pelvis was performed using the standard protocol following bolus administration of intravenous contrast. CONTRAST:  OMNIPAQUE IOHEXOL 300 MG/ML  SOLN COMPARISON:  None. FINDINGS: Lower chest: Basilar emphysema changes with hyperinflation. Normal heart size. No pericardial or pleural effusion. Aorta atherosclerotic. Hepatobiliary: No focal liver abnormality is seen. No gallstones, gallbladder wall thickening, or biliary dilatation. Pancreas: Unremarkable. No pancreatic ductal dilatation or surrounding inflammatory changes. Spleen: Normal in size without focal abnormality. Adrenals/Urinary Tract: Normal adrenal glands. Kidneys demonstrate parapelvic cysts bilaterally. No renal obstruction or hydronephrosis. No hydroureter or ureteral dilatation. Bladder is partially obscured by the left hip replacement hardware. Stomach/Bowel: Diffuse large volume of pneumoperitoneum throughout the abdomen compatible with perforated  viscus. In the left mid abdomen, there 2 loops of dilated abnormal appearing small bowel with fecalization and what appears to be pneumatosis suspicious for ischemia. Stomach and duodenum are underdistended without surrounding inflammatory change or fluid. Moderate colonic stool burden. Normal appendix. Diverticulosis in the sigmoid region without acute inflammatory process or wall thickening. No fluid collection, abscess, hemorrhage, or ascites.  Vascular/Lymphatic: Extensive calcific atherosclerosis of the aorta, iliac and abdominal vasculature. Mesenteric and renal vasculature do appear patent proximally. No veno-occlusive process.  No bulky adenopathy. Reproductive: Unremarkable by CT. Other: Very small umbilical hernia containing fat and free air. No large ventral hernia. No inguinal hernia. Musculoskeletal: Degenerative changes of the spine. Previous left hip arthroplasty. No acute osseous finding. No compression fracture. IMPRESSION: Large volume of abdominopelvic pneumoperitoneum with dilated abnormal appearing small bowel in the left abdomen with fecalization and pneumatosis suspicious for ischemic small bowel. No significant free fluid, hemorrhage or abscess. These results were called by telephone at the time of interpretation on 09/04/2019 at 6:53 pm to provider Alphonsa Overall, who verbally acknowledged these results. Electronically Signed   By: Jerilynn Mages.  Shick M.D.   On: 09/04/2019 18:56   Dg Chest Port 1 View  Result Date: 09/05/2019 CLINICAL DATA:  Endotracheal tube position. EXAM: PORTABLE CHEST 1 VIEW COMPARISON:  August 30, 2019. FINDINGS: The heart size and mediastinal contours are within normal limits. Endotracheal and nasogastric tubes are in grossly good position. No pneumothorax is noted. No acute pulmonary disease is noted. The visualized skeletal structures are unremarkable. IMPRESSION: Endotracheal and nasogastric tubes are in grossly good position. No acute cardiopulmonary abnormality seen. Electronically Signed   By: Marijo Conception M.D.   On: 09/05/2019 07:53   Dg Abd Acute 2+v W 1v Chest  Addendum Date: 09/04/2019   ADDENDUM REPORT: 09/04/2019 16:14 ADDENDUM: These results were called by telephone at the time of interpretation on 09/04/2019 at 4:14 pm to provider [facility] PATEL , who verbally acknowledged these results. Electronically Signed   By: Constance Holster M.D.   On: 09/04/2019 16:14   Result Date:  09/04/2019 CLINICAL DATA:  Shortness of breath EXAM: DG ABDOMEN ACUTE W/ 1V CHEST COMPARISON:  September 02, 2019 FINDINGS: The lungs are hyperexpanded. There is no pneumothorax. Emphysematous changes are noted. There is an airspace opacity in the right upper lobe. Pleuroparenchymal scarring is noted the lung apices. Heart size is stable. Scarring versus atelectasis is noted at the lung bases. Evaluation of the abdomen demonstrates pneumoperitoneum. There are dilated loops of small bowel measuring up to approximately 5 cm. There is a large amount of stool in the right hemicolon. The patient is status post total hip arthroplasty on the left. Degenerative changes are noted throughout the lumbar spine. IMPRESSION: 1. Moderate volume pneumoperitoneum concerning for hollow viscus perforation. 2. Dilated loops of small bowel concerning for small bowel obstruction. 3. Right upper lobe airspace opacity. This could represent atelectasis or developing infiltrate. A pulmonary nodule is not entirely excluded. A 4-6 week follow-up two-view chest x-ray is recommended to confirm resolution of this finding. 4. Large amount of stool in the right hemicolon. Electronically Signed: By: Constance Holster M.D. On: 09/04/2019 16:05    Anti-infectives: Anti-infectives (From admission, onward)   Start     Dose/Rate Route Frequency Ordered Stop   09/04/19 1800  fluconazole (DIFLUCAN) IVPB 100 mg  Status:  Discontinued     100 mg 50 mL/hr over 60 Minutes Intravenous Every 24 hours 09/04/19 1626 09/05/19 0909   09/04/19 1800  piperacillin-tazobactam (ZOSYN) IVPB 3.375 g     3.375 g 12.5 mL/hr over 240 Minutes Intravenous Every 8 hours 09/04/19 1644     09/02/19 1000  doxycycline (VIBRA-TABS) tablet 100 mg  Status:  Discontinued     100 mg Oral Every 12 hours 09/02/19 0941 09/04/19 1627   08/31/19 0245  azithromycin (ZITHROMAX) 500 mg in sodium chloride 0.9 % 250 mL IVPB  Status:  Discontinued     500 mg 250 mL/hr over 60  Minutes Intravenous Daily at bedtime 08/31/19 0239 09/02/19 0941   08/31/19 0130  cefTRIAXone (ROCEPHIN) 1 g in sodium chloride 0.9 % 100 mL IVPB     1 g 200 mL/hr over 30 Minutes Intravenous  Once 08/31/19 0121 08/31/19 0255       Assessment/Plan COPD, 2L home O2, prednisone 10mg  daily GERD CAD, s/p NSTEMI  H/o PE on Xarelto  Acute on Chronic Hypoxemic Respiratory Failure/ Acute Exacerbation of COPD - per CCM Atrial fibrillation  Pneumoperitoneum S/p EXPLORATORY LAPAROTOMY 10/26 Dr. Ezzard StandingNewman - POD#2 - No evidence of any intra-abdominal pathology to explain pneumoperitoneum - Continue NPO/NGT and await return in bowel function. NG tube with no output but flushes and seems to be hooked up appropriately, will check abdominal film to check placement. Mobilize as able. Hold on starting tube feedings.  ID - currently zosyn 10/26>> (no need for abx from surgical standpoint) FEN - IVF, NPO/NGT to LIWS. Hypokalemia replaced VTE - SCDs, lovenox Foley - in place   LOS: 6 days    Franne FortsBrooke A , [facility] 09/06/2019, 7:54 AM Please see Amion for pager number during day hours 7:00am-4:30pm

## 2019-09-07 DIAGNOSIS — J441 Chronic obstructive pulmonary disease with (acute) exacerbation: Secondary | ICD-10-CM | POA: Diagnosis not present

## 2019-09-07 LAB — CBC
HCT: 42.8 % (ref 39.0–52.0)
Hemoglobin: 13.5 g/dL (ref 13.0–17.0)
MCH: 31.5 pg (ref 26.0–34.0)
MCHC: 31.5 g/dL (ref 30.0–36.0)
MCV: 100 fL (ref 80.0–100.0)
Platelets: 303 10*3/uL (ref 150–400)
RBC: 4.28 MIL/uL (ref 4.22–5.81)
RDW: 13.8 % (ref 11.5–15.5)
WBC: 21.4 10*3/uL — ABNORMAL HIGH (ref 4.0–10.5)
nRBC: 0 % (ref 0.0–0.2)

## 2019-09-07 LAB — BASIC METABOLIC PANEL
Anion gap: 11 (ref 5–15)
BUN: 30 mg/dL — ABNORMAL HIGH (ref 8–23)
CO2: 28 mmol/L (ref 22–32)
Calcium: 8.3 mg/dL — ABNORMAL LOW (ref 8.9–10.3)
Chloride: 100 mmol/L (ref 98–111)
Creatinine, Ser: 1.09 mg/dL (ref 0.61–1.24)
GFR calc Af Amer: >60 mL/min (ref >60)
GFR calc non Af Amer: >60 mL/min (ref >60)
Glucose, Bld: 137 mg/dL — ABNORMAL HIGH (ref 70–99)
Potassium: 4.2 mmol/L (ref 3.5–5.1)
Sodium: 139 mmol/L (ref 135–145)

## 2019-09-07 LAB — MAGNESIUM: Magnesium: 2.4 mg/dL (ref 1.7–2.4)

## 2019-09-07 LAB — GLUCOSE, CAPILLARY: Glucose-Capillary: 103 mg/dL — ABNORMAL HIGH (ref 70–99)

## 2019-09-07 MED ORDER — BELLADONNA ALKALOIDS-OPIUM 16.2-60 MG RE SUPP: 1.0000 | Freq: Three times a day (TID) | RECTAL | Status: DC | PRN

## 2019-09-07 MED ORDER — SODIUM CHLORIDE 0.9 % IV SOLN
Freq: Once | INTRAVENOUS | Status: AC
  Administered 2019-09-07: 10:00:00 via INTRAVENOUS

## 2019-09-07 MED ORDER — BELLADONNA ALKALOIDS-OPIUM 16.2-60 MG RE SUPP
1.0000 | Freq: Once | RECTAL | Status: AC
  Administered 2019-09-07: 1 via RECTAL
  Filled 2019-09-07: qty 1

## 2019-09-07 MED ORDER — METHYLPREDNISOLONE SODIUM SUCC 40 MG IJ SOLR
40.0000 mg | Freq: Every day | INTRAMUSCULAR | Status: DC
  Administered 2019-09-07 – 2019-09-11 (×5): 40 mg via INTRAVENOUS
  Filled 2019-09-07 (×5): qty 1

## 2019-09-07 MED ORDER — SODIUM CHLORIDE 0.9 % IV SOLN: INTRAVENOUS | Status: DC

## 2019-09-07 NOTE — Progress Notes (Signed)
NAME:  Theodore [name], MRN:  [id number], DOB:  July 31, 1956, LOS: 7 ADMISSION DATE:  08/30/2019, CONSULTATION DATE:  10/26 REFERRING MD: Ovidio Kin, Lynden Oxford , CHIEF COMPLAINT:  Dyspnea, abdominal pain   Brief History   63 year old man with 2-3L O2 dependent COPD, CAD, PE who presented 10/21 with acute dyspnea thought secondary to AECOPD.  He developed abdominal pain & was found to have pneumoperitoneum on 10/26.  Subsequently was taken for exploratory laparotomy for possible pneumoperitoneum and ischemic small bowel. However, no underlying cause for pneumoperitoneum found. Remained intubated post-operatively, to ICU.  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  To OR with no source found. AF after surgery. 10/26 Ex-lap >> "he had no evidence of ischemia, perforation, or abnormal changes of his small bowel.  He did had a bolus of food with undigested food particles that bulged in the small bowel, and I suspect, that this is what gave the impression of pneumatosis in the small bowel."   10/27 Bronchospastic on vent, failed wean  10/28 Extubated  Consults:  Surgery   Procedures:  ETT 10/26 >> 10/28  Significant Diagnostic Tests:   ECHO 10/22 >> LVEF 65-70%, hyperdynamic function, no LVH, RV mildly enlarged, global RV function normal, LA normal, RA normal  ABD CXR 10/26 >> moderate volume pneumoperitoneum concerning for hollow viscus perforation, dilated loops of small bowel concerning for SBO, RUL airspace opacity, large amt stool in right hemicolon  CT ABD 10/26 >> large volume of  abdominopelvic pneumoperitoneum with dilated abnormal appearing small bowel in the left abdomen with fecalization and pneumatosis suspicious for ischemic small bowel, no significant free fluid, hemorrhage or abscess  Micro Data:  COVID 10/21 >> negative  MRSA PCR 10/26 >> negative   Antimicrobials:  Ceftriaxone x1 10/21  Azithromycin 10/21 >> 10/23  Doxycycline 1024 >> 10/26 Fluconazole 10/26 >> 10/27 Zosyn 10/26 >> 10/28  Interim history/subjective:  RN reports no UOP overnight, question of leaking foley.  Bladder scan empty.  No change in Sr Cr. Afebrile.   Objective   Blood pressure (!) 162/87, pulse 96, temperature 98.2 F (36.8 C), temperature source Oral, resp. rate 18, height 6\' 4"  (1.93 m), weight 103 kg, SpO2 98 %.        Intake/Output Summary (Last 24 hours) at 09/07/2019 0752 Last data filed at 09/07/2019 09/09/2019 Gross per 24 hour  Intake -  Output 260 ml  Net -260 ml   Filed Weights   08/30/19 2331 09/04/19 2316  Weight: 104.3 kg 103 kg    Examination: General: adult male lying in bed in NAD   HEENT: MM pink/dry, no jvd, NGT in place Neuro: AAOx4, speech clear, MAE,  Normal strength  CV: s1s2 rrr, SR/ST with PAC's on monitor, no m/r/g PULM:  Even/non-labored, lungs bilaterally clear anterior, diminished bases  GI: protuberant, soft, decreased BS, midline surgical incision with staples intact, dressing changed by surgical PA on exam  Extremities: warm/dry, BLE edema, unna boots in place   Skin: no rashes or lesions  Resolved Hospital Problem list     Assessment & Plan:   Pneumoperitoneum  S/p ex  lap for pneumoperitoneum without acute findings.  -post-operative care per CCS, appreciate assistance  -change to SDU status  -NGT per surgery, trial of clamping 10/29. If N/distention place back to LIS  -follow abdominal exam   Acute on Chronic Hypoxemic Respiratory Failure Acute Exacerbation of COPD  In setting of obstructive lung disease with acute  exacerbation, 2L O2 dependent at baseline -O2 as needed to support sats >90% -pulmonary hygiene - IS, mobilize / OOB  -Continue brovana + pulmicort BID  -Duoneb Q6  -decrease solumedrol to 40 mg QD   Atrial Fibrillation  HTN New onset overnight 10/27.  NSR 10/29 -tele monitoring  -PRN lopressor for rate control / HTN  -full dose lovenox   At Risk AKI  -hold lasix 10/29 -NS at 6075ml/hr  -Trend BMP / urinary output -Replace electrolytes as indicated -Avoid nephrotoxic agents, ensure adequate renal perfusion  Suspected Bladder Spasms -now belladonna/opium PR then PRN  -monitor UOP, note pt does have urine in tubing, bladder scan empty  Hx PE  -full dose lovenox, hold xarelto    Best practice:  Diet: NPO Pain/Anxiety/Delirium protocol (if indicated): PRN fentanyl  VAP protocol (if indicated): yes DVT prophylaxis: lovenox full dose  GI prophylaxis: PPI BID Glucose control: not indicated  Mobility: bedrest Code Status: full Family Communication: Both sisters updated 10/28.  Will update on arrival 10/29.  Disposition: ICU  Labs   CBC: Recent Labs  Lab 09/01/19 0309  09/03/19 0321 09/04/19 0307 09/05/19 0142 09/06/19 0144 09/07/19 0513  WBC 16.4*   < > 15.3* 14.7* 28.1* 20.4* 21.4*  NEUTROABS 14.9*  --   --   --   --   --   --   HGB 12.7*   < > 13.6 13.6 14.5 14.0 13.5  HCT 38.9*   < > 41.5 42.0 44.1 43.7 42.8  MCV 97.0   < > 97.2 97.4 96.9 98.4 100.0  PLT 357   < > 389 374 367 280 303   < > = values in this interval not displayed.    Basic Metabolic Panel: Recent Labs  Lab 09/03/19 0321 09/04/19 0307 09/05/19 0142 09/06/19 0144 09/07/19 0513  NA 134* 136 135 136 139  K 4.0 3.8 3.7 3.4* 4.2  CL 95* 97* 95* 98 100  CO2 28 29 28 27 28   GLUCOSE 150* 153* 153* 146* 137*  BUN 28* 29* 28* 31* 30*  CREATININE 1.14 1.06 1.00 1.23 1.09  CALCIUM 8.7* 8.6* 8.4* 7.9* 8.3*  MG 2.3 2.1 2.1 2.3 2.4   GFR: Estimated Creatinine Clearance: 83 mL/min (by C-G formula  based on SCr of 1.09 mg/dL). Recent Labs  Lab 09/04/19 0307 09/05/19 0142 09/06/19 0144 09/07/19 0513  WBC 14.7* 28.1* 20.4* 21.4*    Liver Function Tests: Recent Labs  Lab 09/01/19 0309  AST 21  ALT 15  ALKPHOS 47  BILITOT 0.8  PROT 6.2*  ALBUMIN 3.3*   No results for input(s): LIPASE, AMYLASE in the last 168 hours. No results for input(s): AMMONIA in the last 168 hours.  ABG    Component Value Date/Time   PHART 7.497 (H) 09/04/2019 2255   PCO2ART 35.6 09/04/2019 2255   PO2ART 148 (H) 09/04/2019 2255   HCO3 27.3 09/04/2019 2255   TCO2 25 08/31/2019 0014   ACIDBASEDEF 3.7 (H) 01/10/2014 1244   O2SAT 95.6 01/10/2014 1244     Coagulation Profile: No results for input(s): INR, PROTIME in the last 168 hours.  Cardiac Enzymes: No results for input(s): CKTOTAL,  CKMB, CKMBINDEX, TROPONINI in the last 168 hours.  HbA1C: Hemoglobin A1C  Date/Time Value Ref Range Status  07/27/2017 5.2  Final   Hgb A1c MFr Bld  Date/Time Value Ref Range Status  08/18/2018 11:44 AM 5.6 4.6 - 6.5 % Final    Comment:    Glycemic Control Guidelines for People with Diabetes:Non Diabetic:  <6%Goal of Therapy: <7%Additional Action Suggested:  >8%   04/30/2016 09:09 AM 5.8 4.6 - 6.5 % Final    Comment:    Glycemic Control Guidelines for People with Diabetes:Non Diabetic:  <6%Goal of Therapy: <7%Additional Action Suggested:  >8%     CBG: Recent Labs  Lab 09/02/19 0745 09/03/19 0753 09/04/19 0953 09/05/19 0819 09/06/19 0758  GLUCAP 124* 130* 240* 135* 116*    Critical care time:  n/a        Noe Gens, NP-C [facility] 09/07/2019, 7:52 AM

## 2019-09-07 NOTE — Progress Notes (Signed)
eLink Physician-Brief Progress Note Patient Name: Theodore [name] DOB: [birth year] MRN: [id number]   Date of Service  09/07/2019  HPI/Events of Note  Notified of oliguria.  No urine output reported.  Pt has foley in place and may have leaking around it.  Bladder scan showing an empty bladder.   eICU Interventions  Increase NS to 75cc/hr.     Intervention Category Intermediate Interventions: Oliguria - evaluation and management  Elsie Lincoln 09/07/2019, 12:56 AM

## 2019-09-07 NOTE — Progress Notes (Signed)
From 0400 on 10/28 to 1856 on 10/28 pt had 250 mL of urine output in Foley. Since 1856 on 10/28 until now pt has had no urine output from Foley.  Pt was given a bath and there was a small amount of wetness on bed suggesting possible leakage from Foley.  Bladder scan was performed twice both resulting in 0 mL in bladder.  ELink notified.  Will continue to monitor.

## 2019-09-07 NOTE — Evaluation (Signed)
Occupational Therapy Evaluation Patient Details Name: Theodore [name] MRN: [id number] DOB: 04-Apr-1956 Today's Date: 09/07/2019    History of Present Illness 63 y.o. male with medical history significant for steroid-dependent COPD, chronic hypoxic respiratory failure, coronary artery disease, chronic leg swelling, and history of PE on Xarelto, now presenting to the emergency department for evaluation of shortness of breath. Dx: COPD exacerbation. S/P ex lap 10/26 due to pneumoperitoneum. Extubated 10/28.   Clinical Impression   Pt was admitted for the above. At baseline, he is mod I and he lives with his mother and performs meal prep/housekeeping.  2 sisters are in the area and are helping more with his mother at this time.  Pt got up to chair with extra time, min +2 assist for safety/lines.  He would benefit from AE to restore independence. Pt was limited by bladder spasms.  He states this was his first time up since being moved to ICU/SDU.  Will follow in acute setting with the goals listed below. Pt's mother cannot physically assist him; he may need SNF for rehab.     Follow Up Recommendations  SNF;Supervision/Assistance - 24 hour    Equipment Recommendations  (tba further, ? 3:1)    Recommendations for Other Services       Precautions / Restrictions Precautions Precautions: Fall Precaution Comments: baseline O2 dependent at night only--2L; monitor O2/HR; abdominal sg Restrictions Weight Bearing Restrictions: No      Mobility Bed Mobility     Rolling: Min assist   Supine to sit: Min assist     General bed mobility comments: cues for sequence  Transfers   Equipment used: Rolling walker (2 wheeled)   Sit to Stand: Min assist;+2 physical assistance;+2 safety/equipment;From elevated surface Stand pivot transfers: Min guard;From elevated surface;+2 safety/equipment       General transfer comment: light assistance to rise and steady.  Assist for lines.  Increased  time    Balance                                           ADL either performed or assessed with clinical judgement   ADL Overall ADL's : Needs assistance/impaired                         Toilet Transfer: Minimal assistance;+2 for safety/equipment;Stand-pivot;RW(to chair)             General ADL Comments: Pt is able to perform UB adls with set up and assistance for multiple lines including NG tube.  Pt needs max to total A +2 for LB adls due to pain/abdominal sx. Pt was on bedpan when I arrived; total A for hygiene from bed level.  Introduced Fish farm manager of AE but did not use or show this session     Vision         Perception     Praxis      Pertinent Vitals/Pain Pain Score: 8  Pain Location: bladder spasms; comfortable after repositioning in chair Pain Descriptors / Indicators: Spasm Pain Intervention(s): Limited activity within patient's tolerance;Monitored during session;Repositioned;Patient requesting pain meds-RN notified     Hand Dominance     Extremity/Trunk Assessment Upper Extremity Assessment Upper Extremity Assessment: Overall WFL for tasks assessed:  MMT deferred           Communication Communication Communication: No difficulties   Cognition Arousal/Alertness: Awake/alert Behavior  During Therapy: WFL for tasks assessed/performed Overall Cognitive Status: Within Functional Limits for tasks assessed                                     General Comments  VSS; sats in 90s; HR up to 129.  BP in chair 161/79    Exercises     Shoulder Instructions      Home Living Family/patient expects to be discharged to:: Unsure                             Home Equipment: Shower seat;Walker - 4 wheels   Additional Comments: pt from home with mother.  Sisters help her with adls/groceries (2 sisters); son performs IADLS      Prior Functioning/Environment Level of Independence: Independent         Comments: used RW after hip fx        OT Problem List: Decreased strength;Decreased activity tolerance;Impaired balance (sitting and/or standing);Decreased knowledge of use of DME or AE;Decreased knowledge of precautions;Cardiopulmonary status limiting activity;Pain      OT Treatment/Interventions: Self-care/ADL training;DME and/or AE instruction;Energy conservation;Balance training;Patient/family education;Therapeutic activities    OT Goals(Current goals can be found in the care plan section) Acute Rehab OT Goals Patient Stated Goal: none stated; agreeable to OT OT Goal Formulation: With patient Time For Goal Achievement: 09/21/19 Potential to Achieve Goals: Good ADL Goals Pt Will Transfer to Toilet: with supervision;ambulating;bedside commode Pt Will Perform Toileting - Clothing Manipulation and hygiene: with supervision;sit to/from stand Additional ADL Goal #1: Pt wil gather clothes and perform adl at supervison level using AE as needed  OT Frequency: Min 2X/week   Barriers to D/C:            Co-evaluation              AM-PAC OT "6 Clicks" Daily Activity     Outcome Measure Help from another person eating meals?: None Help from another person taking care of personal grooming?: A Little Help from another person toileting, which includes using toliet, bedpan, or urinal?: A Lot Help from another person bathing (including washing, rinsing, drying)?: A Lot Help from another person to put on and taking off regular upper body clothing?: A Little Help from another person to put on and taking off regular lower body clothing?: Total 6 Click Score: 15   End of Session    Activity Tolerance: Patient limited by pain Patient left: in chair;with call bell/phone within reach;with nursing/sitter in room  OT Visit Diagnosis: Muscle weakness (generalized) (M62.81);Unsteadiness on feet (R26.81)                Time: 4709-6283 OT Time Calculation (min): 29 min Charges:  OT General  Charges $OT Visit: 1 Visit OT Evaluation $OT Eval Moderate Complexity: 1 Mod OT Treatments $Therapeutic Activity: 8-22 mins  Lesle Chris, OTR/L Acute Rehabilitation Services [phone] [facility] pager [phone] office 09/07/2019  [place] 09/07/2019, 11:10 AM

## 2019-09-07 NOTE — Progress Notes (Signed)
[facility] Progress Note  3 Days Post-Op  Subjective: CC-  Extubated yesterday. Abdominal pain improving although he does complain of bladder spasms. Oliguric over night. Creatinine 1.09 NG with only 10cc out last 24 hours. States that he is passing a good amount of flatus. No BM. Denies n/v. Feels hungry.  Objective: Vital signs in last 24 hours: Temp:  [98.2 F (36.8 C)-98.4 F (36.9 C)] 98.2 F (36.8 C) (10/29 0344) Pulse Rate:  [82-124] 96 (10/29 0637) Resp:  [16-25] 18 (10/29 0637) BP: (129-188)/(65-124) 162/87 (10/29 0617) SpO2:  [87 %-98 %] 98 % (10/29 0637) Last BM Date: 08/31/19  Intake/Output from previous day: 10/28 0701 - 10/29 0700 In: -  Out: 260 [Urine:250; Emesis/NG output:10] Intake/Output this shift: No intake/output data recorded.  PE: Gen:  Alert, NAD Card:  RRR Pulm:  rate and effort normal Abd: mild distension, nontender, hypoactive BS, midline incision with some bloody drainage but no cellulitis Skin: warm and dry  Lab Results:  Recent Labs    09/06/19 0144 09/07/19 0513  WBC 20.4* 21.4*  HGB 14.0 13.5  HCT 43.7 42.8  PLT 280 303   BMET Recent Labs    09/06/19 0144 09/07/19 0513  NA 136 139  K 3.4* 4.2  CL 98 100  CO2 27 28  GLUCOSE 146* 137*  BUN 31* 30*  CREATININE 1.23 1.09  CALCIUM 7.9* 8.3*   PT/INR No results for input(s): LABPROT, INR in the last 72 hours. CMP     Component Value Date/Time   NA 139 09/07/2019 0513   NA 140 08/07/2017   K 4.2 09/07/2019 0513   CL 100 09/07/2019 0513   CO2 28 09/07/2019 0513   GLUCOSE 137 (H) 09/07/2019 0513   BUN 30 (H) 09/07/2019 0513   BUN 12 08/07/2017   CREATININE 1.09 09/07/2019 0513   CALCIUM 8.3 (L) 09/07/2019 0513   PROT 6.2 (L) 09/01/2019 0309   ALBUMIN 3.3 (L) 09/01/2019 0309   AST 21 09/01/2019 0309   ALT 15 09/01/2019 0309   ALKPHOS 47 09/01/2019 0309   BILITOT 0.8 09/01/2019 0309   GFRNONAA >60 09/07/2019 0513   GFRAA >60 09/07/2019 0513   Lipase      Component Value Date/Time   LIPASE 20 02/03/2012 2022       Studies/Results: Dg Chest Port 1 View  Result Date: 09/06/2019 CLINICAL DATA:  Acute respiratory failure with hypoxia. EXAM: PORTABLE CHEST 1 VIEW COMPARISON:  September 05, 2019. FINDINGS: The heart size and mediastinal contours are within normal limits. Endotracheal and nasogastric tube are unchanged in position. No pneumothorax or pleural effusion is noted. Right lung is clear. Minimal left basilar subsegmental atelectasis is noted. The visualized skeletal structures are unremarkable. IMPRESSION: Stable support apparatus. Minimal left basilar subsegmental atelectasis. Electronically Signed   By: Lupita Raider M.D.   On: 09/06/2019 07:55   Dg Abd Portable 1v  Result Date: 09/06/2019 CLINICAL DATA:  Nasogastric tube placement. EXAM: PORTABLE ABDOMEN - 1 VIEW COMPARISON:  September 04, 2019. FINDINGS: The bowel gas pattern is normal. Distal tip of nasogastric tube is seen in the stomach. No radio-opaque calculi or other significant radiographic abnormality are seen. IMPRESSION: Distal tip of nasogastric tube seen in the stomach. Electronically Signed   By: Lupita Raider M.D.   On: 09/06/2019 10:25    Anti-infectives: Anti-infectives (From admission, onward)   Start     Dose/Rate Route Frequency Ordered Stop   09/04/19 1800  fluconazole (DIFLUCAN) IVPB 100 mg  Status:  Discontinued     100 mg 50 mL/hr over 60 Minutes Intravenous Every 24 hours 09/04/19 1626 09/05/19 0909   09/04/19 1800  piperacillin-tazobactam (ZOSYN) IVPB 3.375 g  Status:  Discontinued     3.375 g 12.5 mL/hr over 240 Minutes Intravenous Every 8 hours 09/04/19 1644 09/06/19 0838   09/02/19 1000  doxycycline (VIBRA-TABS) tablet 100 mg  Status:  Discontinued     100 mg Oral Every 12 hours 09/02/19 0941 09/04/19 1627   08/31/19 0245  azithromycin (ZITHROMAX) 500 mg in sodium chloride 0.9 % 250 mL IVPB  Status:  Discontinued     500 mg 250 mL/hr over 60  Minutes Intravenous Daily at bedtime 08/31/19 0239 09/02/19 0941   08/31/19 0130  cefTRIAXone (ROCEPHIN) 1 g in sodium chloride 0.9 % 100 mL IVPB     1 g 200 mL/hr over 30 Minutes Intravenous  Once 08/31/19 0121 08/31/19 0255       Assessment/Plan COPD, 2L home O2, prednisone 10mg  daily GERD CAD, s/p NSTEMI  H/o PE on Xarelto  Acute on Chronic Hypoxemic Respiratory Failure/ Acute Exacerbation of COPD - extubated 10/28, per CCM Atrial fibrillation - currently in NSR Oliguric  Pneumoperitoneum S/p EXPLORATORY LAPAROTOMY 10/26 Dr. Lucia Gaskins - POD#3 - No evidence of any intra-abdominal pathology to explain pneumoperitoneum - Passing flatus and NG tube with minimal output - Clamp NG tube and allow sips of clear liquids from the floor. Mobilize. PT/OT.  ID - currently zosyn 10/26>>10/28, diflucan 10/26>>10/27 FEN - IVF, NG clamped/ sips of clears VTE - SCDs, lovenox Foley - in place   LOS: 7 days    Wellington Hampshire, [facility] 09/07/2019, 8:17 AM Please see Amion for pager number during day hours 7:00am-4:30pm

## 2019-09-08 DIAGNOSIS — J441 Chronic obstructive pulmonary disease with (acute) exacerbation: Secondary | ICD-10-CM | POA: Diagnosis not present

## 2019-09-08 LAB — CBC
HCT: 39.5 % (ref 39.0–52.0)
Hemoglobin: 11.9 g/dL — ABNORMAL LOW (ref 13.0–17.0)
MCH: 31.1 pg (ref 26.0–34.0)
MCHC: 30.1 g/dL (ref 30.0–36.0)
MCV: 103.1 fL — ABNORMAL HIGH (ref 80.0–100.0)
Platelets: 308 10*3/uL (ref 150–400)
RBC: 3.83 MIL/uL — ABNORMAL LOW (ref 4.22–5.81)
RDW: 13.6 % (ref 11.5–15.5)
WBC: 18 10*3/uL — ABNORMAL HIGH (ref 4.0–10.5)
nRBC: 0 % (ref 0.0–0.2)

## 2019-09-08 LAB — BASIC METABOLIC PANEL
Anion gap: 9 (ref 5–15)
BUN: 30 mg/dL — ABNORMAL HIGH (ref 8–23)
CO2: 29 mmol/L (ref 22–32)
Calcium: 8.3 mg/dL — ABNORMAL LOW (ref 8.9–10.3)
Chloride: 104 mmol/L (ref 98–111)
Creatinine, Ser: 0.97 mg/dL (ref 0.61–1.24)
GFR calc Af Amer: >60 mL/min (ref >60)
GFR calc non Af Amer: >60 mL/min (ref >60)
Glucose, Bld: 99 mg/dL (ref 70–99)
Potassium: 3.9 mmol/L (ref 3.5–5.1)
Sodium: 142 mmol/L (ref 135–145)

## 2019-09-08 LAB — GLUCOSE, CAPILLARY: Glucose-Capillary: 78 mg/dL (ref 70–99)

## 2019-09-08 LAB — MAGNESIUM: Magnesium: 2.4 mg/dL (ref 1.7–2.4)

## 2019-09-08 MED ORDER — FENTANYL CITRATE (PF) 100 MCG/2ML IJ SOLN
25.0000 ug | INTRAMUSCULAR | Status: DC | PRN
  Administered 2019-09-08 – 2019-09-12 (×7): 25 ug via INTRAVENOUS
  Filled 2019-09-08 (×7): qty 2

## 2019-09-08 MED ORDER — LABETALOL HCL 5 MG/ML IV SOLN
10.0000 mg | Freq: Four times a day (QID) | INTRAVENOUS | Status: DC | PRN
  Administered 2019-09-09: 10 mg via INTRAVENOUS
  Filled 2019-09-08: qty 4

## 2019-09-08 NOTE — Progress Notes (Signed)
ANTICOAGULATION CONSULT NOTE   Pharmacy Consult for lovenox Indication: hx pulmonary embolus; new onset afib  Allergies  Allergen Reactions  . Naproxen Sodium Itching    Patient Measurements: Height: 6\' 4"  (193 cm) Weight: 227 lb 1.2 oz (103 kg) IBW/kg (Calculated) : 86.8 Heparin Dosing Weight:   Vital Signs: Temp: 98 F (36.7 C) (10/30 0800) Temp Source: Oral (10/30 0800) BP: 151/99 (10/30 0900) Pulse Rate: 84 (10/30 0900)  Labs: Recent Labs    09/06/19 0144 09/07/19 0513 09/08/19 0149  HGB 14.0 13.5 11.9*  HCT 43.7 42.8 39.5  PLT 280 303 308  CREATININE 1.23 1.09 0.97    Estimated Creatinine Clearance: 93.2 mL/min (by C-G formula based on SCr of 0.97 mg/dL).   Medications:  - on xarelto 20 mg daily PTA  Assessment: Patient's a 63 y.o M with hx PE on xarelto PTA presented to the ED on 10/21 with SOB.  She was found to have pneumoperitoneum and underwent expl lap on 10/26.  He remained intubated after the procedure was transferred to the ICU.  He was found to have new onset afib on 10/27. Pharmacy is consulted to transition xarelto to lovenox on 10/27.  Today, 09/08/2019: - hgb down 11.9, plts stable - blood oozing at incision site noted on 10/29 PM with lovenox placed on hold per MD.  Still with some oozing noted today, but has improved - crcl>30  Goal of Therapy:  Anti-Xa level 0.6-1 units/ml 4hrs after LMWH dose given Monitor platelets by anticoagulation protocol: Yes   Plan:  - Per CCS, continue to hold LMWH for now - monitor for severity of bleeding at incision site - please advise if/when anticoag. can be resumed back for patient  Myka Lukins P 09/08/2019,11:04 AM

## 2019-09-08 NOTE — Progress Notes (Signed)
PROGRESS NOTE    Theodore [name]  ZOX:[id number] DOB: [birth year] DOA: 08/30/2019 PCP: [name], Kazumi W, MD    Brief Narrative:   Theodore [name] is a 63 y.o. male with medical history significant for steroid-dependent COPD, chronic hypoxic respiratory failure, coronary artery disease, chronic leg swelling, and history of PE on Xarelto, now presenting to the emergency department for evaluation of shortness of breath.  He is found to be hypoxic with labored respirations with chest x-ray notable for chronic hyperinflation, emphysema without acute findings.  Covid-19 was negative.  Patient was originally admitted for underlying COPD exacerbation and treatment with steroids, breathing treatments and antibiotics.  During the hospitalization, patient developed abdominal pain on 09/04/2019 with CT abdomen/pelvis findings of pneumoperitoneum, and he underwent exploratory laparotomy without any significant findings to suggest any intra-abdominal pathology to explain the CAT scan findings.  He was difficult to wean from the ventilator, and was maintained on ventilatory support for 48 hours prior to successful extubation on 09/06/2019.  He continues with significant debility postoperative ileus.  [facility]  Assessment & Plan:   Principal Problem:   COPD with acute exacerbation (HCC) Active Problems:   COPD GOLD IV    COPD exacerbation (HCC)   Chronic respiratory failure assoc with cor pulmonale   CAD (coronary artery disease)   DOE (dyspnea on exertion)   Chronic anticoagulation   Chronic respiratory failure with hypoxia (HCC)   Recurrent pulmonary emboli (HCC)   Hyponatremia   Acute on chronic respiratory failure with hypoxia (HCC)   Pneumoperitoneum   Current chronic use of systemic steroids   Pressure injury of skin   Acute on chronic hypoxic respiratory failure Acute COPD exacerbation Patient presenting to ED with progressive  shortness of breath associated with increased respiratory drive likely secondary to his underlying COPD.  Patient 2-3 L oxygen at baseline.  Home medications include Symbicort, Singulair, prednisone 10 mg p.o. daily.  Chest x-ray notable for hyperexpanded lungs without other acute findings.  Hospital course complicated by exploratory laparotomy in which she was difficult to extubate postoperatively, remained on ventilatory support for 48 hours before being extubated on 09/06/2019. --Supplemental oxygen currently weaned down to 2 L nasal cannula, goal SPO2 >88% --Nebs with Brovana and Pulmicort twice daily --Duo nebs every 6 hours scheduled --Solu-Medrol 40 mg IV daily; until can tolerate oral --Incentive spirometry hourly while awake and encourage increased mobilization  Pneumoperitoneum On 09/04/2019, patient developed abdominal pain.  Was noted to have pneumoperitoneum on CT abdomen/pelvis.  Emergently taken to the OR by general surgery and underwent exploratory laparotomy by Dr. Ezzard StandingNewman without any findings to suggest etiology noted on CT scan. --General surgery following, appreciate assistance --Continues with abdominal distention, reports flatus/BM yesterday --Currently NG tube clamped, surgery plans advancement of diet to clears today --KUB tomorrow morning  Acute blood loss anemia Drop 13.5-11.9 today.  Blood oozing at surgical site dressing.  Currently holding anticoagulation. --Continue to monitor CBC daily  Paroxysmal atrial fibrillation New onset noted on 09/05/2019.  Likely exacerbated by acute illness.  Currently in normal sinus rhythm. On Xarelto outpatient for history of PE, currently on hold secondary to ABLA/oozing at surgical site. --Continue to monitor on telemetry --metoprolol IV PRN q3h to maintain HR <130  Essential hypertension On Maxide outpatient.  BP 165/68 this morning.  Was n.p.o., now diet being slowly transitioned.  NG tube clamped. --Will hold oral  antihypertensives at this time. --Labetalol 10mg  IV prn for  SBP >170 or DBP >110  Bladder spasms Likely etiology from clogged Foley catheter, in which Foley was discontinued on 09/07/2019.  Patient reports no further issues.  Hx PE On Xarelto outpatient.  Currently holding anticoagulation due to acute blood loss anemia with oozing at surgical site. --Continue to monitor CBC daily   DVT prophylaxis: SCDs, holding Lovenox secondary to ABLA/oozing from surgical site Code Status: Full code Family Communication: None Disposition Plan: Continue inpatient, SDU level of care; further dependent on clinical course, therapy currently recommending SNF placement.   Consultants:   PCCM  General surgery  Procedures:   Exploratory laparotomy 10/26, Dr. Lucia Gaskins  Intubation; extubated 09/06/2019  Foley catheter, removed 09/07/2019  Antimicrobials:   Zosyn 10/26 - 10/28  Diflucan 10/26 - 10/28   Subjective: Patient seen and examined at bedside, nursing present.  Continues with some shortness of breath and abdominal distention.  Denies pain.  Breathing much improved since admission.  Has some oozing at surgical site.  No other specific complaints or concerns at this time.  Denies headache, no fever/chills/night sweats, no nausea/vomiting/diarrhea, no chest pain, no palpitations, no abdominal discomfort, no paresthesias.  No acute events overnight per nursing staff.  Objective: Vitals:   09/08/19 0700 09/08/19 0747 09/08/19 0800 09/08/19 0900  BP: (!) 160/81  (!) 170/90 (!) 151/99  Pulse: (!) 104  99 84  Resp: 14  13 14   Temp:   98 F (36.7 C)   TempSrc:   Oral   SpO2: 99% 99% 100% 100%  Weight:      Height:        Intake/Output Summary (Last 24 hours) at 09/08/2019 1053 Last data filed at 09/08/2019 0951 Gross per 24 hour  Intake 2751.89 ml  Output 2215 ml  Net 536.89 ml   Filed Weights   08/30/19 2331 09/04/19 2316  Weight: 104.3 kg 103 kg    Examination:  General  exam: Appears calm and comfortable; appears older than stated age Respiratory system: Coarse breath sounds bilaterally with slightly decreased bases with mild late expiratory wheezing, normal respiratory effort, on 2 L nasal cannula Cardiovascular system: S1 & S2 heard, RRR. No JVD, murmurs, rubs, gallops or clicks. No pedal edema. Gastrointestinal system: Abdomen is nondistended, soft and nontender. No organomegaly or masses felt. Normal bowel sounds heard.  Surgical site noted with dressing in place with slightly blood-tinged dressing Central nervous system: Alert and oriented. No focal neurological deficits. Extremities: Symmetric 5 x 5 power. Skin: No rashes, lesions or ulcers Psychiatry: Judgement and insight appear normal. Mood & affect appropriate.     Data Reviewed: I have personally reviewed following labs and imaging studies  CBC: Recent Labs  Lab 09/04/19 0307 09/05/19 0142 09/06/19 0144 09/07/19 0513 09/08/19 0149  WBC 14.7* 28.1* 20.4* 21.4* 18.0*  HGB 13.6 14.5 14.0 13.5 11.9*  HCT 42.0 44.1 43.7 42.8 39.5  MCV 97.4 96.9 98.4 100.0 103.1*  PLT 374 367 280 303 818   Basic Metabolic Panel: Recent Labs  Lab 09/04/19 0307 09/05/19 0142 09/06/19 0144 09/07/19 0513 09/08/19 0149  NA 136 135 136 139 142  K 3.8 3.7 3.4* 4.2 3.9  CL 97* 95* 98 100 104  CO2 29 28 27 28 29   GLUCOSE 153* 153* 146* 137* 99  BUN 29* 28* 31* 30* 30*  CREATININE 1.06 1.00 1.23 1.09 0.97  CALCIUM 8.6* 8.4* 7.9* 8.3* 8.3*  MG 2.1 2.1 2.3 2.4 2.4   GFR: Estimated Creatinine Clearance: 93.2 mL/min (by C-G formula based on  SCr of 0.97 mg/dL). Liver Function Tests: No results for input(s): AST, ALT, ALKPHOS, BILITOT, PROT, ALBUMIN in the last 168 hours. No results for input(s): LIPASE, AMYLASE in the last 168 hours. No results for input(s): AMMONIA in the last 168 hours. Coagulation Profile: No results for input(s): INR, PROTIME in the last 168 hours. Cardiac Enzymes: No results for  input(s): CKTOTAL, CKMB, CKMBINDEX, TROPONINI in the last 168 hours. BNP (last 3 results) No results for input(s): PROBNP in the last 8760 hours. HbA1C: No results for input(s): HGBA1C in the last 72 hours. CBG: Recent Labs  Lab 09/04/19 0953 09/05/19 0819 09/06/19 0758 09/07/19 0826 09/08/19 0740  GLUCAP 240* 135* 116* 103* 78   Lipid Profile: No results for input(s): CHOL, HDL, LDLCALC, TRIG, CHOLHDL, LDLDIRECT in the last 72 hours. Thyroid Function Tests: No results for input(s): TSH, T4TOTAL, FREET4, T3FREE, THYROIDAB in the last 72 hours. Anemia Panel: No results for input(s): VITAMINB12, FOLATE, FERRITIN, TIBC, IRON, RETICCTPCT in the last 72 hours. Sepsis Labs: No results for input(s): PROCALCITON, LATICACIDVEN in the last 168 hours.  Recent Results (from the past 240 hour(s))  SARS Coronavirus 2 by RT PCR (hospital order, performed in [facility] hospital lab) Nasopharyngeal Nasopharyngeal Swab     Status: None   Collection Time: 08/30/19 11:40 PM   Specimen: Nasopharyngeal Swab  Result Value Ref Range Status   SARS Coronavirus 2 NEGATIVE NEGATIVE Final    Comment: (NOTE) If result is NEGATIVE SARS-CoV-2 target nucleic acids are NOT DETECTED. The SARS-CoV-2 RNA is generally detectable in upper and lower  respiratory specimens during the acute phase of infection. The lowest  concentration of SARS-CoV-2 viral copies this assay can detect is 250  copies / mL. A negative result does not preclude SARS-CoV-2 infection  and should not be used as the sole basis for treatment or other  patient management decisions.  A negative result may occur with  improper specimen collection / handling, submission of specimen other  than nasopharyngeal swab, presence of viral mutation(s) within the  areas targeted by this assay, and inadequate number of viral copies  (<250 copies / mL). A negative result must be combined with clinical  observations, patient history, and epidemiological  information. If result is POSITIVE SARS-CoV-2 target nucleic acids are DETECTED. The SARS-CoV-2 RNA is generally detectable in upper and lower  respiratory specimens dur ing the acute phase of infection.  Positive  results are indicative of active infection with SARS-CoV-2.  Clinical  correlation with patient history and other diagnostic information is  necessary to determine patient infection status.  Positive results do  not rule out bacterial infection or co-infection with other viruses. If result is PRESUMPTIVE POSTIVE SARS-CoV-2 nucleic acids MAY BE PRESENT.   A presumptive positive result was obtained on the submitted specimen  and confirmed on repeat testing.  While 2019 novel coronavirus  (SARS-CoV-2) nucleic acids may be present in the submitted sample  additional confirmatory testing may be necessary for epidemiological  and / or clinical management purposes  to differentiate between  SARS-CoV-2 and other Sarbecovirus currently known to infect humans.  If clinically indicated additional testing with an alternate test  methodology [phone]) is advised. The SARS-CoV-2 RNA is generally  detectable in upper and lower respiratory sp ecimens during the acute  phase of infection. The expected result is Negative. Fact Sheet for Patients:  BoilerBrush.com.cy Fact Sheet for Healthcare Providers: https://pope.com/ This test is not yet approved or cleared by the [place] FDA and has  been authorized for detection and/or diagnosis of SARS-CoV-2 by FDA under an Emergency Use Authorization (EUA).  This EUA will remain in effect (meaning this test can be used) for the duration of the COVID-19 declaration under Section 564(b)(1) of the Act, 21 U.S.C. section 360bbb-3(b)(1), unless the authorization is terminated or revoked sooner. Performed at [facility], [address], [place], [place] [address]   Surgical PCR  screen     Status: None   Collection Time: 09/04/19  7:51 PM   Specimen: Nasal Mucosa; Nasal Swab  Result Value Ref Range Status   MRSA, PCR NEGATIVE NEGATIVE Final   Staphylococcus aureus NEGATIVE NEGATIVE Final    Comment: (NOTE) The Xpert SA Assay (FDA approved for NASAL specimens in patients 44 years of age and older), is one component of a comprehensive surveillance program. It is not intended to diagnose infection nor to guide or monitor treatment. Performed at [facility], [address], [place], [place] [address]   MRSA PCR Screening     Status: None   Collection Time: 09/04/19 10:29 PM   Specimen: Nasal Mucosa; Nasopharyngeal  Result Value Ref Range Status   MRSA by PCR NEGATIVE NEGATIVE Final    Comment:        The GeneXpert MRSA Assay (FDA approved for NASAL specimens only), is one component of a comprehensive MRSA colonization surveillance program. It is not intended to diagnose MRSA infection nor to guide or monitor treatment for MRSA infections. Performed at [facility], [address] [place], [place] [address]          Radiology Studies: No results found.      Scheduled Meds: . arformoterol  15 mcg Nebulization BID  . bisacodyl  10 mg Rectal Daily  . budesonide (PULMICORT) nebulizer solution  0.5 mg Nebulization BID  . chlorhexidine  15 mL Mouth Rinse BID  . Chlorhexidine Gluconate Cloth  6 each Topical Daily  . fluticasone  2 spray Each Nare Daily  . ipratropium-albuterol  3 mL Nebulization Q6H  . lip balm  1 application Topical BID  . mouth rinse  15 mL Mouth Rinse q12n4p  . methylPREDNISolone (SOLU-MEDROL) injection  40 mg Intravenous Daily  . pantoprazole (PROTONIX) IV  40 mg Intravenous Daily  . sodium chloride flush  3 mL Intravenous Q12H  . sodium chloride HYPERTONIC  4 mL Nebulization BID   Continuous Infusions: . sodium chloride 20 mL/hr at 09/07/19 0531  . sodium chloride 40 mL/hr at  09/08/19 0854     LOS: 8 days    Time spent: 35 minutes spent on chart review, discussion with nursing staff, consultants, updating family and interview/physical exam; more than 50% of that time was spent in counseling and/or coordination of care.    Alvira Philips [place], DO [facility] 09/08/2019, 10:53 AM

## 2019-09-08 NOTE — Progress Notes (Signed)
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  He developed abdominal pain & was found to have pneumoperitoneum on 10/26.  Subsequently was taken for exploratory laparotomy for possible pneumoperitoneum and ischemic small bowel. However, no underlying cause for pneumoperitoneum found. Remained intubated post-operatively, to ICU.  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  To OR with no source found. AF after surgery. 10/26 Ex-lap >> "he had no evidence of ischemia, perforation, or abnormal changes of his small bowel.  He did had a bolus of food with undigested food particles that bulged in the small bowel, and I suspect, that this is what gave the impression of pneumatosis in the small bowel."   10/27 Bronchospastic on vent, failed wean  10/28 Extubated  Consults:  Surgery   Procedures:  ETT 10/26 >> 10/28  Significant Diagnostic Tests:   ECHO 10/22 >> LVEF 65-70%, hyperdynamic function, no LVH, RV mildly enlarged, global RV function normal, LA normal, RA normal  ABD CXR 10/26 >> moderate volume pneumoperitoneum concerning for hollow viscus perforation, dilated loops of small bowel concerning for SBO, RUL airspace opacity, large amt stool in right hemicolon  CT ABD 10/26 >> large volume of  abdominopelvic pneumoperitoneum with dilated abnormal appearing small bowel in the left abdomen with fecalization and pneumatosis suspicious for ischemic small bowel, no significant free fluid, hemorrhage or abscess  Micro Data:  COVID 10/21 >> negative  MRSA PCR 10/26 >> negative   Antimicrobials:  Ceftriaxone x1 10/21  Azithromycin 10/21 >> 10/23  Doxycycline 1024 >> 10/26 Fluconazole 10/26 >> 10/27 Zosyn 10/26 >> 10/28  Interim history/subjective:  Pt reports feeling better overall.  NGT remains clamped, tolerating water.  Foley removed yesterday due to obstruction, voiding without difficulty.   Objective   Blood pressure (!) 160/81, pulse (!) 104, temperature 97.6 F (36.4 C), temperature source Oral, resp. rate 14, height 6\' 4"  (1.93 m), weight 103 kg, SpO2 99 %.        Intake/Output Summary (Last 24 hours) at 09/08/2019 0804 Last data filed at 09/08/2019 0700 Gross per 24 hour  Intake 3330.77 ml  Output 1865 ml  Net 1465.77 ml   Filed Weights   08/30/19 2331 09/04/19 2316  Weight: 104.3 kg 103 kg    Examination: General: adult male lying in bed in NAD, breathing treatment in progres HEENT: MM pink/moist, neb mask in place, no jvd, NGT in place/clamped Neuro: AAOx4, speech clear, MAE CV: s1s2 irr, ST with PAC's on monitor, no m/r/g PULM: prolonged exp phase, diminished with diffuse wheeze GI: soft, bsx4 active  Extremities: warm/dry, BLE edema, unna boots in place Skin: no rashes or lesions  Resolved Hospital Problem list     Assessment & Plan:   Pneumoperitoneum  S/p ex lap for pneumoperitoneum without acute findings.  -post-operative care per [facility]CS  Acute  on Chronic Hypoxemic Respiratory Failure Acute Exacerbation of COPD  In setting of obstructive lung disease with acute exacerbation, 2L O2 dependent at baseline -wean O2 as needed for sats 88-95% -pulmonary hygiene - IS, mobilize, OOB -Brovana + Pulmicort BID  -continue duoneb Q6 -solumedrol 40 mg QD    Atrial Fibrillation  HTN New onset overnight 10/27.  NSR 10/29 -per primary   At Risk AKI  -defer to primary  Suspected Bladder Spasms Foley clogged 10/29, removed.  No further issues.  Hx PE  -full dose lovenox per primary  -defer timing of xarelto restart to TRH/CCS   Best practice:  Diet: NPO Pain/Anxiety/Delirium protocol (if indicated): PRN fentanyl  VAP protocol (if indicated): yes DVT prophylaxis: lovenox full dose  GI prophylaxis: PPI BID Glucose control: not indicated  Mobility: bedrest Code Status: full Family Communication: Patient updated on plan of care 10/30  Disposition: ICU  Labs   CBC: Recent Labs  Lab 09/04/19 0307 09/05/19 0142 09/06/19 0144 09/07/19 0513 09/08/19 0149  WBC 14.7* 28.1* 20.4* 21.4* 18.0*  HGB 13.6 14.5 14.0 13.5 11.9*  HCT 42.0 44.1 43.7 42.8 39.5  MCV 97.4 96.9 98.4 100.0 103.1*  PLT 374 367 280 303 259    Basic Metabolic Panel: Recent Labs  Lab 09/04/19 0307 09/05/19 0142 09/06/19 0144 09/07/19 0513 09/08/19 0149  NA 136 135 136 139 142  K 3.8 3.7 3.4* 4.2 3.9  CL 97* 95* 98 100 104  CO2 29 28 27 28 29   GLUCOSE 153* 153* 146* 137* 99  BUN 29* 28* 31* 30* 30*  CREATININE 1.06 1.00 1.23 1.09 0.97  CALCIUM 8.6* 8.4* 7.9* 8.3* 8.3*  MG 2.1 2.1 2.3 2.4 2.4   GFR: Estimated Creatinine Clearance: 93.2 mL/min (by C-G formula based on SCr of 0.97 mg/dL). Recent Labs  Lab 09/05/19 0142 09/06/19 0144 09/07/19 0513 09/08/19 0149  WBC 28.1* 20.4* 21.4* 18.0*    Liver Function Tests: No results for input(s): AST, ALT, ALKPHOS, BILITOT, PROT, ALBUMIN in the last 168 hours. No results for input(s): LIPASE, AMYLASE in the last 168 hours. No results for input(s): AMMONIA in the last 168 hours.  ABG    Component Value Date/Time   PHART 7.497 (H) 09/04/2019 2255   PCO2ART 35.6 09/04/2019 2255   PO2ART 148 (H) 09/04/2019 2255   HCO3 27.3 09/04/2019 2255   TCO2 25 08/31/2019 0014   ACIDBASEDEF 3.7 (H) 01/10/2014  1244   O2SAT 95.6 01/10/2014 1244     Coagulation Profile: No results for input(s): INR, PROTIME in the last 168 hours.  Cardiac Enzymes: No results for input(s): CKTOTAL, CKMB, CKMBINDEX, TROPONINI in the last 168 hours.  HbA1C: Hemoglobin A1C  Date/Time Value Ref Range Status  07/27/2017 5.2  Final   Hgb A1c MFr Bld  Date/Time Value Ref Range Status  08/18/2018 11:44 AM 5.6 4.6 - 6.5 % Final    Comment:    Glycemic Control Guidelines for People with Diabetes:Non Diabetic:  <6%Goal of Therapy: <7%Additional Action Suggested:  >8%   04/30/2016 09:09 AM 5.8 4.6 - 6.5 % Final    Comment:    Glycemic Control Guidelines for People with Diabetes:Non Diabetic:  <6%Goal of Therapy: <7%Additional Action Suggested:  >8%     CBG: Recent Labs  Lab 09/04/19 0953 09/05/19 0819 09/06/19 0758 09/07/19 0826 09/08/19 0740  GLUCAP 240* 135* 116* 103* 78    Critical care time:  n/a        Noe Gens, NP-C [facility]  09/08/2019, 8:04 AM

## 2019-09-08 NOTE — Progress Notes (Addendum)
Physical Therapy Treatment Patient Details Name: Theodore [name] MRN: [id number] DOB: 1956/05/11 Today's Date: 09/08/2019    History of Present Illness 63 y.o. male with medical history significant for steroid-dependent COPD, chronic hypoxic respiratory failure, coronary artery disease, chronic leg swelling, and history of PE on Xarelto, now presenting to the emergency department for evaluation of shortness of breath. Dx: COPD exacerbation. S/P ex lap 10/26 due to pneumoperitoneum. Extubated 10/28.    PT Comments    Pt progressing, able to incr amb distance today, amb into hallway ~ 40' on 2-3 L O2. May need SNF depending on progress/acute LOS.  Will continue to follow in acute setting   Follow Up Recommendations  SNF;Home health PT;Supervision/Assistance - 24 hour(depending on progress)     Equipment Recommendations  None recommended by PT    Recommendations for Other Services       Precautions / Restrictions Precautions Precautions: Fall Precaution Comments: baseline O2 dependent at night only--2L; monitor O2/HR; abdominal sg Restrictions Weight Bearing Restrictions: No    Mobility  Bed Mobility Overal bed mobility: Needs Assistance             General bed mobility comments: OOB in chair  Transfers Overall transfer level: Needs assistance Equipment used: Rolling walker (2 wheeled) Transfers: Sit to/from Stand Sit to Stand: Min assist         General transfer comment: light assistance to rise and steady.  Assist for lines.  Increased time. 2 trials d/t fatigue with first standing attempt   Ambulation/Gait Ambulation/Gait assistance: Min assist;+2 safety/equipment(family following with chair ) Gait Distance (Feet): 40 Feet Assistive device: Rolling walker (2 wheeled) Gait Pattern/deviations: Step-through pattern;Decreased stride length     General Gait Details: cues for breathing, trunk extension,  and  RW position; SpO2= 92-96% on 2-3L, HR 99-120, RR 21-34  with DOE 3/4     Stairs             Wheelchair Mobility    Modified Rankin (Stroke Patients Only)       Balance                                            Cognition Arousal/Alertness: Awake/alert Behavior During Therapy: WFL for tasks assessed/performed Overall Cognitive Status: Within Functional Limits for tasks assessed                                        Exercises General Exercises - Lower Extremity Ankle Circles/Pumps: AROM;Both;10 reps Quad Sets: AROM;Both;10 reps    General Comments General comments (skin integrity, edema, etc.): cues for posture, trunk extension, shoulder depression in combination with slow, pursed lip breathing      Pertinent Vitals/Pain Pain Assessment: No/denies pain    Home Living                      Prior Function            PT Goals (current goals can now be found in the care plan section) Acute Rehab PT Goals Patient Stated Goal: none stated; agreeable to OT PT Goal Formulation: With patient Time For Goal Achievement: 09/15/19 Potential to Achieve Goals: Good Progress towards PT goals: Progressing toward goals    Frequency    Min 3X/week  PT Plan Current plan remains appropriate    Co-evaluation              AM-PAC PT "6 Clicks" Mobility   Outcome Measure  Help needed turning from your back to your side while in a flat bed without using bedrails?: A Lot Help needed moving from lying on your back to sitting on the side of a flat bed without using bedrails?: A Lot Help needed moving to and from a bed to a chair (including a wheelchair)?: A Lot Help needed standing up from a chair using your arms (e.g., wheelchair or bedside chair)?: A Little Help needed to walk in hospital room?: A Lot Help needed climbing 3-5 steps with a railing? : A Lot 6 Click Score: 13    End of Session Equipment Utilized During Treatment: Gait belt Activity Tolerance: Patient  tolerated treatment well;Patient limited by fatigue Patient left: in chair;with call bell/phone within reach;with family/visitor present Nurse Communication: Mobility status PT Visit Diagnosis: Difficulty in walking, not elsewhere classified (R26.2);Muscle weakness (generalized) (M62.81)     Time: 1740-8144 PT Time Calculation (min) (ACUTE ONLY): 34 min  Charges:  $Gait Training: 23-37 mins                     Drucilla Chalet, PT  Pager: [phone] Acute Rehab Dept [facility]): [phone]   09/08/2019    [facility] 09/08/2019, 1:50 PM

## 2019-09-08 NOTE — Progress Notes (Signed)
[facility] Progress Note  4 Days Post-Op  Subjective: CC-  Feeling ok today. Abdominal pain about the same. NG tube has been clamped x24 hours. Denies n/v. Continues to pass flatus. States that he had a small loose stool this AM.  PT/OT recommending SNF.  Objective: Vital signs in last 24 hours: Temp:  [97.6 F (36.4 C)-98.5 F (36.9 C)] 98 F (36.7 C) (10/30 0800) Pulse Rate:  [80-110] 104 (10/30 0700) Resp:  [12-21] 14 (10/30 0700) BP: (137-207)/(68-99) 160/81 (10/30 0700) SpO2:  [93 %-100 %] 99 % (10/30 0747) Last BM Date: 09/07/19  Intake/Output from previous day: 10/29 0701 - 10/30 0700 In: 3405.8 [P.O.:360; I.V.:3045.8] Out: 1865 [Urine:1865] Intake/Output this shift: No intake/output data recorded.  PE: Gen: Alert, NAD Card:RRR Pulm:rate and effort normal QIW:LNLG distension, nontender, +BS, midline incision with some bloody drainage but no cellulitis Skin: warm and dry   Lab Results:  Recent Labs    09/07/19 0513 09/08/19 0149  WBC 21.4* 18.0*  HGB 13.5 11.9*  HCT 42.8 39.5  PLT 303 308   BMET Recent Labs    09/07/19 0513 09/08/19 0149  NA 139 142  K 4.2 3.9  CL 100 104  CO2 28 29  GLUCOSE 137* 99  BUN 30* 30*  CREATININE 1.09 0.97  CALCIUM 8.3* 8.3*   PT/INR No results for input(s): LABPROT, INR in the last 72 hours. CMP     Component Value Date/Time   NA 142 09/08/2019 0149   NA 140 08/07/2017   K 3.9 09/08/2019 0149   CL 104 09/08/2019 0149   CO2 29 09/08/2019 0149   GLUCOSE 99 09/08/2019 0149   BUN 30 (H) 09/08/2019 0149   BUN 12 08/07/2017   CREATININE 0.97 09/08/2019 0149   CALCIUM 8.3 (L) 09/08/2019 0149   PROT 6.2 (L) 09/01/2019 0309   ALBUMIN 3.3 (L) 09/01/2019 0309   AST 21 09/01/2019 0309   ALT 15 09/01/2019 0309   ALKPHOS 47 09/01/2019 0309   BILITOT 0.8 09/01/2019 0309   GFRNONAA >60 09/08/2019 0149   GFRAA >60 09/08/2019 0149   Lipase     Component Value Date/Time   LIPASE 20 02/03/2012  2022       Studies/Results: Dg Abd Portable 1v  Result Date: 09/06/2019 CLINICAL DATA:  Nasogastric tube placement. EXAM: PORTABLE ABDOMEN - 1 VIEW COMPARISON:  September 04, 2019. FINDINGS: The bowel gas pattern is normal. Distal tip of nasogastric tube is seen in the stomach. No radio-opaque calculi or other significant radiographic abnormality are seen. IMPRESSION: Distal tip of nasogastric tube seen in the stomach. Electronically Signed   By: Lupita Raider M.D.   On: 09/06/2019 10:25    Anti-infectives: Anti-infectives (From admission, onward)   Start     Dose/Rate Route Frequency Ordered Stop   09/04/19 1800  fluconazole (DIFLUCAN) IVPB 100 mg  Status:  Discontinued     100 mg 50 mL/hr over 60 Minutes Intravenous Every 24 hours 09/04/19 1626 09/05/19 0909   09/04/19 1800  piperacillin-tazobactam (ZOSYN) IVPB 3.375 g  Status:  Discontinued     3.375 g 12.5 mL/hr over 240 Minutes Intravenous Every 8 hours 09/04/19 1644 09/06/19 0838   09/02/19 1000  doxycycline (VIBRA-TABS) tablet 100 mg  Status:  Discontinued     100 mg Oral Every 12 hours 09/02/19 0941 09/04/19 1627   08/31/19 0245  azithromycin (ZITHROMAX) 500 mg in sodium chloride 0.9 % 250 mL IVPB  Status:  Discontinued     500  mg 250 mL/hr over 60 Minutes Intravenous Daily at bedtime 08/31/19 0239 09/02/19 0941   08/31/19 0130  cefTRIAXone (ROCEPHIN) 1 g in sodium chloride 0.9 % 100 mL IVPB     1 g 200 mL/hr over 30 Minutes Intravenous  Once 08/31/19 0121 08/31/19 0255       Assessment/Plan COPD, 2L home O2, prednisone 10mg  daily GERD CAD, s/p NSTEMI H/oPE on Xarelto  Acute on Chronic Hypoxemic Respiratory Failure/Acute Exacerbation of COPD- extubated 10/28, per CCM Atrial fibrillation - currently in NSR ABL anemia - Hgb 11.9 from 13.5. hold lovenox  Pneumoperitoneum S/pEXPLORATORY LAPAROTOMY10/26 Dr. Lucia Gaskins - POD#4 -No evidence of any intra-abdominal pathology to explain pneumoperitoneum - Tolerating  clamping trial, passing flatus and had small BM but he is still quite distended. Keep NG tube clamped and advance to clear liquid tray. Check abdominal film in AM, may be able to d/c NG tube then.  - Continue mobilizing, PT/OT. - Abdominal wound is oozing blood. Reinforce dressing and apply ice packs. Hold anticoagulation.   ID -zosyn 10/26>>10/28, diflucan 10/26>>10/27 FEN -IVF, NG clamped/ CLD VTE -SCDs, hold lovenox Foley - out   LOS: 8 days    Wellington Hampshire, [facility] 09/08/2019, 8:29 AM Please see Amion for pager number during day hours 7:00am-4:30pm

## 2019-09-09 ENCOUNTER — Inpatient Hospital Stay (HOSPITAL_COMMUNITY): Payer: PPO

## 2019-09-09 DIAGNOSIS — K668 Other specified disorders of peritoneum: Secondary | ICD-10-CM | POA: Diagnosis not present

## 2019-09-09 DIAGNOSIS — J441 Chronic obstructive pulmonary disease with (acute) exacerbation: Secondary | ICD-10-CM | POA: Diagnosis not present

## 2019-09-09 LAB — CBC
HCT: 43.1 % (ref 39.0–52.0)
Hemoglobin: 13.2 g/dL (ref 13.0–17.0)
MCH: 31.4 pg (ref 26.0–34.0)
MCHC: 30.6 g/dL (ref 30.0–36.0)
MCV: 102.4 fL — ABNORMAL HIGH (ref 80.0–100.0)
Platelets: 322 10*3/uL (ref 150–400)
RBC: 4.21 MIL/uL — ABNORMAL LOW (ref 4.22–5.81)
RDW: 13.5 % (ref 11.5–15.5)
WBC: 18.3 10*3/uL — ABNORMAL HIGH (ref 4.0–10.5)
nRBC: 0 % (ref 0.0–0.2)

## 2019-09-09 LAB — MAGNESIUM: Magnesium: 2.3 mg/dL (ref 1.7–2.4)

## 2019-09-09 LAB — BASIC METABOLIC PANEL
Anion gap: 10 (ref 5–15)
BUN: 26 mg/dL — ABNORMAL HIGH (ref 8–23)
CO2: 26 mmol/L (ref 22–32)
Calcium: 8.5 mg/dL — ABNORMAL LOW (ref 8.9–10.3)
Chloride: 104 mmol/L (ref 98–111)
Creatinine, Ser: 0.79 mg/dL (ref 0.61–1.24)
GFR calc Af Amer: >60 mL/min (ref >60)
GFR calc non Af Amer: >60 mL/min (ref >60)
Glucose, Bld: 110 mg/dL — ABNORMAL HIGH (ref 70–99)
Potassium: 4.5 mmol/L (ref 3.5–5.1)
Sodium: 140 mmol/L (ref 135–145)

## 2019-09-09 LAB — GLUCOSE, CAPILLARY: Glucose-Capillary: 100 mg/dL — ABNORMAL HIGH (ref 70–99)

## 2019-09-09 MED ORDER — METOCLOPRAMIDE HCL 5 MG/ML IJ SOLN
10.0000 mg | Freq: Four times a day (QID) | INTRAMUSCULAR | Status: AC
  Administered 2019-09-09 – 2019-09-11 (×8): 10 mg via INTRAVENOUS
  Filled 2019-09-09 (×8): qty 2

## 2019-09-09 MED ORDER — KCL IN DEXTROSE-NACL 20-5-0.9 MEQ/L-%-% IV SOLN
INTRAVENOUS | Status: DC
  Administered 2019-09-09 – 2019-09-11 (×4): via INTRAVENOUS
  Filled 2019-09-09 (×5): qty 1000

## 2019-09-09 MED ORDER — HYDRALAZINE HCL 20 MG/ML IJ SOLN
10.0000 mg | INTRAMUSCULAR | Status: DC | PRN
  Filled 2019-09-09: qty 1

## 2019-09-09 NOTE — Progress Notes (Signed)
NG tube placed to LIS, immediately 56ml of dark brown liquid. Returned, pt reported relief of abdominal distention.

## 2019-09-09 NOTE — Progress Notes (Signed)
PROGRESS NOTE    Theodore MARET  [name]:[id number] DOB: October 22, 1956 DOA: 08/30/2019 PCP: Theodore Levins, MD    Brief Narrative:   Theodore [name] is a 63 y.o. male with medical history significant for steroid-dependent COPD, chronic hypoxic respiratory failure, coronary artery disease, chronic leg swelling, and history of PE on Xarelto, now presenting to the emergency department for evaluation of shortness of breath.  He is found to be hypoxic with labored respirations with chest x-ray notable for chronic hyperinflation, emphysema without acute findings.  Covid-19 was negative.  Patient was originally admitted for underlying COPD exacerbation and treatment with steroids, breathing treatments and antibiotics.  During the hospitalization, patient developed abdominal pain on 09/04/2019 with CT abdomen/pelvis findings of pneumoperitoneum, and he underwent exploratory laparotomy without any significant findings to suggest any intra-abdominal pathology to explain the CAT scan findings.  He was difficult to wean from the ventilator, and was maintained on ventilatory support for 48 hours prior to successful extubation on 09/06/2019.  He continues with significant debility postoperative ileus.  Patient was transferred back to [facility] service from [facility] on 09/08/2019.  Assessment & Plan:   Principal Problem:   COPD with acute exacerbation (HCC) Active Problems:   COPD GOLD IV    COPD exacerbation (HCC)   Chronic respiratory failure assoc with cor pulmonale   CAD (coronary artery disease)   DOE (dyspnea on exertion)   Chronic anticoagulation   Chronic respiratory failure with hypoxia (HCC)   Recurrent pulmonary emboli (HCC)   Hyponatremia   Acute on chronic respiratory failure with hypoxia (HCC)   Pneumoperitoneum   Current chronic use of systemic steroids   Pressure injury of skin   Acute on chronic hypoxic respiratory failure Acute COPD exacerbation Patient presenting to ED with progressive  shortness of breath associated with increased respiratory drive likely secondary to his underlying COPD.  Patient 2-3 L oxygen at baseline.  Home medications include Symbicort, Singulair, prednisone 10 mg p.o. daily.  Chest x-ray notable for hyperexpanded lungs without other acute findings.  Hospital course complicated by exploratory laparotomy in which she was difficult to extubate postoperatively, remained on ventilatory support for 48 hours before being extubated on 09/06/2019. --Supplemental oxygen currently weaned down to 2 L nasal cannula, goal SPO2 >88% --Nebs with Brovana and Pulmicort twice daily --Duo nebs every 6 hours scheduled --Solu-Medrol 40 mg IV daily; until can tolerate oral --Incentive spirometry hourly while awake and encourage increased mobilization  Pneumoperitoneum Postoperative ileus On 09/04/2019, patient developed abdominal pain.  Was noted to have pneumoperitoneum on CT abdomen/pelvis.  Emergently taken to the OR by general surgery and underwent exploratory laparotomy by Dr. Ezzard Standing without any findings to suggest etiology noted on CT scan. --General surgery following, appreciate assistance --Continues with abdominal distention, reports flatus, no BMs past 24 hours --KUB this morning with progressive SBO likely postoperative ileus versus obstruction --Placed back NPO and NGT to suction per surgery today  Acute blood loss anemia Drop 13.5 to11.9 10/30; but up to 13.2 today.  Blood oozing at surgical site dressing.  Currently holding anticoagulation. --Per surgery, avoid frequent dressing changes and provide reinforcement/ice packs --Continue to hold anticoagulation --Continue to monitor CBC daily  Paroxysmal atrial fibrillation New onset noted on 09/05/2019.  Likely exacerbated by acute illness.  Currently in normal sinus rhythm. On Xarelto outpatient for history of PE, currently on hold secondary to ABLA/oozing at surgical site. --Continue to monitor on  telemetry --metoprolol IV PRN q3h to maintain HR <130  Essential hypertension  On Maxide outpatient.  BP 188/89 this morning.  Continues to be n.p.o. secondary to his postoperative ileus; now NG tube back on LWS --Unable to use oral medications at this time --Labetalol  IV prn for SBP >170 or DBP >110 --Added hydralazine IV prn --If continues to be an issue, may need to initiate cardene gtt given his tenuous GI status  Bladder spasms: resolved Likely etiology from clogged Foley catheter, in which Foley was discontinued on 09/07/2019.  Patient reports no further issues.  Hx PE On Xarelto outpatient.  Currently holding anticoagulation due to acute blood loss anemia with oozing at surgical site. --Continue to monitor CBC daily   DVT prophylaxis: SCDs, holding Lovenox secondary to ABLA/oozing from surgical site Code Status: Full code Family Communication: None Disposition Plan: Continue inpatient, SDU level of care; further dependent on clinical course, therapy currently recommending SNF placement.   Consultants:   PCCM  General surgery  Procedures:   Exploratory laparotomy 10/26, Dr. Ezzard Standing  Intubation; extubated 09/06/2019  Foley catheter, removed 09/07/2019  Antimicrobials:   Zosyn 10/26 - 10/28  Diflucan 10/26 - 10/28   Subjective: Patient seen and examined at bedside, nursing present.  Continues to complain of fatigue/weakness, mild abdominal discomfort with increased abdominal distention.  Feels breathing improved, close to baseline.    Continues with some shortness of breath and abdominal distention.  Denies pain.  Breathing much improved since admission.  Has some oozing at surgical site.  No other specific complaints or concerns at this time.  Denies headache, no fever/chills/night sweats, no nausea/vomiting/diarrhea, no chest pain, no palpitations, no abdominal discomfort, no paresthesias.  No acute events overnight per nursing staff.  Objective: Vitals:    09/09/19 0600 09/09/19 0700 09/09/19 0800 09/09/19 0900  BP: (!) 182/89   (!) 165/82  Pulse: 77 89  82  Resp: Temp:   98 F (36.7 C)   TempSrc:   Oral   SpO2: 98% 95%  91%  Weight:      Height:        Intake/Output Summary (Last 24 hours) at 09/09/2019 1101 Last data filed at 09/08/2019 2200 Gross per 24 hour  Intake 360.54 ml  Output 550 ml  Net -189.46 ml   Filed Weights   08/30/19 2331 09/04/19 2316  Weight: 104.3 kg 103 kg    Examination:  General exam: Appears calm and comfortable; appears older than stated age Respiratory system: Coarse breath sounds bilaterally with slightly decreased bases with mild late expiratory wheezing, normal respiratory effort, on 2 L nasal cannula Cardiovascular system: S1 & S2 heard, RRR. No JVD, murmurs, rubs, gallops or clicks. No pedal edema. Gastrointestinal system: Abdomen is nondistended, soft and nontender. No organomegaly or masses felt. Normal bowel sounds heard.  Surgical site noted with dressing in place with slightly blood-tinged dressing Central nervous system: Alert and oriented. No focal neurological deficits. Extremities: Symmetric 5 x 5 power. Skin: No rashes, lesions or ulcers Psychiatry: Judgement and insight appear normal. Mood & affect appropriate.     Data Reviewed: I have personally reviewed following labs and imaging studies  CBC: Recent Labs  Lab 09/05/19 0142 09/06/19 0144 09/07/19 0513 09/08/19 0149 09/09/19 0217  WBC 28.1* 20.4* 21.4* 18.0* 18.3*  HGB 14.5 14.0 13.5 11.9* 13.2  HCT 44.1 43.7 42.8 39.5 43.1  MCV 96.9 98.4 100.0 103.1* 102.4*  PLT 367 280 303 308 322   Basic Metabolic Panel: Recent Labs  Lab 09/05/19 0142 09/06/19 0144 09/07/19 0513 09/08/19  0149 09/09/19 0217  NA 135 136 139 142 140  K 3.7 3.4* 4.2 3.9 4.5  CL 95* 98 100 104 104  CO2 28 27 28 29 26   GLUCOSE 153* 146* 137* 99 110*  BUN 28* 31* 30* 30* 26*  CREATININE 1.00 1.23 1.09 0.97 0.79  CALCIUM 8.4* 7.9*  8.3* 8.3* 8.5*  MG 2.1 2.3 2.4 2.4 2.3   GFR: Estimated Creatinine Clearance: 113 mL/min (by C-G formula based on SCr of 0.79 mg/dL). Liver Function Tests: No results for input(s): AST, ALT, ALKPHOS, BILITOT, PROT, ALBUMIN in the last 168 hours. No results for input(s): LIPASE, AMYLASE in the last 168 hours. No results for input(s): AMMONIA in the last 168 hours. Coagulation Profile: No results for input(s): INR, PROTIME in the last 168 hours. Cardiac Enzymes: No results for input(s): CKTOTAL, CKMB, CKMBINDEX, TROPONINI in the last 168 hours. BNP (last 3 results) No results for input(s): PROBNP in the last 8760 hours. HbA1C: No results for input(s): HGBA1C in the last 72 hours. CBG: Recent Labs  Lab 09/04/19 0953 09/05/19 0819 09/06/19 0758 09/07/19 0826 09/08/19 0740  GLUCAP 240* 135* 116* 103* 78   Lipid Profile: No results for input(s): CHOL, HDL, LDLCALC, TRIG, CHOLHDL, LDLDIRECT in the last 72 hours. Thyroid Function Tests: No results for input(s): TSH, T4TOTAL, FREET4, T3FREE, THYROIDAB in the last 72 hours. Anemia Panel: No results for input(s): VITAMINB12, FOLATE, FERRITIN, TIBC, IRON, RETICCTPCT in the last 72 hours. Sepsis Labs: No results for input(s): PROCALCITON, LATICACIDVEN in the last 168 hours.  Recent Results (from the past 240 hour(s))  SARS Coronavirus 2 by RT PCR (hospital order, performed in [facility] lab) Nasopharyngeal Nasopharyngeal Swab     Status: None   Collection Time: 08/30/19 11:40 PM   Specimen: Nasopharyngeal Swab  Result Value Ref Range Status   SARS Coronavirus 2 NEGATIVE NEGATIVE Final    Comment: (NOTE) If result is NEGATIVE SARS-CoV-2 target nucleic acids are NOT DETECTED. The SARS-CoV-2 RNA is generally detectable in upper and lower  respiratory specimens during the acute phase of infection. The lowest  concentration of SARS-CoV-2 viral copies this assay can detect is 250  copies / mL. A negative result does not  preclude SARS-CoV-2 infection  and should not be used as the sole basis for treatment or other  patient management decisions.  A negative result may occur with  improper specimen collection / handling, submission of specimen other  than nasopharyngeal swab, presence of viral mutation(s) within the  areas targeted by this assay, and inadequate number of viral copies  (<250 copies / mL). A negative result must be combined with clinical  observations, patient history, and epidemiological information. If result is POSITIVE SARS-CoV-2 target nucleic acids are DETECTED. The SARS-CoV-2 RNA is generally detectable in upper and lower  respiratory specimens dur ing the acute phase of infection.  Positive  results are indicative of active infection with SARS-CoV-2.  Clinical  correlation with patient history and other diagnostic information is  necessary to determine patient infection status.  Positive results do  not rule out bacterial infection or co-infection with other viruses. If result is PRESUMPTIVE POSTIVE SARS-CoV-2 nucleic acids MAY BE PRESENT.   A presumptive positive result was obtained on the submitted specimen  and confirmed on repeat testing.  While 2019 novel coronavirus  (SARS-CoV-2) nucleic acids may be present in the submitted sample  additional confirmatory testing may be necessary for epidemiological  and / or clinical management purposes  to differentiate  between  SARS-CoV-2 and other Sarbecovirus currently known to infect humans.  If clinically indicated additional testing with an alternate test  methodology [phone]) is advised. The SARS-CoV-2 RNA is generally  detectable in upper and lower respiratory sp ecimens during the acute  phase of infection. The expected result is Negative. Fact Sheet for Patients:  BoilerBrush.com.cy Fact Sheet for Healthcare Providers: https://pope.com/ This test is not yet approved or cleared by  the [place] FDA and has been authorized for detection and/or diagnosis of SARS-CoV-2 by FDA under an Emergency Use Authorization (EUA).  This EUA will remain in effect (meaning this test can be used) for the duration of the COVID-19 declaration under Section 564(b)(1) of the Act, 21 U.S.C. section 360bbb-3(b)(1), unless the authorization is terminated or revoked sooner. Performed at [facility], [address], [place], [place] [address]   Surgical PCR screen     Status: None   Collection Time: 09/04/19  7:51 PM   Specimen: Nasal Mucosa; Nasal Swab  Result Value Ref Range Status   MRSA, PCR NEGATIVE NEGATIVE Final   Staphylococcus aureus NEGATIVE NEGATIVE Final    Comment: (NOTE) The Xpert SA Assay (FDA approved for NASAL specimens in patients 36 years of age and older), is one component of a comprehensive surveillance program. It is not intended to diagnose infection nor to guide or monitor treatment. Performed at [facility], [address] [place], [place] [address]   MRSA PCR Screening     Status: None   Collection Time: 09/04/19 10:29 PM   Specimen: Nasal Mucosa; Nasopharyngeal  Result Value Ref Range Status   MRSA by PCR NEGATIVE NEGATIVE Final    Comment:        The GeneXpert MRSA Assay (FDA approved for NASAL specimens only), is one component of a comprehensive MRSA colonization surveillance program. It is not intended to diagnose MRSA infection nor to guide or monitor treatment for MRSA infections. Performed at [facility], [address], [place], [place] [address]          Radiology Studies: Dg Abd Portable 1v  Result Date: 09/09/2019 CLINICAL DATA:  Abdominal distension. Status post exploratory laparotomy on 09/04/2019. EXAM: PORTABLE ABDOMEN - 1 VIEW COMPARISON:  09/06/2019 and abdomen pelvis CT dated 09/04/2019. FINDINGS: Multiple dilated small bowel loops with progression. Interval  skin clips. There is stool and gas in normal caliber colon. Left hip prosthesis. Mild right hip degenerative changes. Probable distal nasogastric tube in the proximal stomach. IMPRESSION: 1. Progressive small bowel dilatation, most likely representing postoperative ileus. Obstruction is also possible. 2. Probable distal nasogastric tube in the proximal stomach. Electronically Signed   By: Beckie Salts M.D.   On: 09/09/2019 06:24        Scheduled Meds:  arformoterol  15 mcg Nebulization BID   bisacodyl  10 mg Rectal Daily   budesonide (PULMICORT) nebulizer solution  0.5 mg Nebulization BID   chlorhexidine  15 mL Mouth Rinse BID   Chlorhexidine Gluconate Cloth  6 each Topical Daily   fluticasone  2 spray Each Nare Daily   ipratropium-albuterol  3 mL Nebulization Q6H   lip balm  1 application Topical BID   mouth rinse  15 mL Mouth Rinse q12n4p   methylPREDNISolone (SOLU-MEDROL) injection  40 mg Intravenous Daily   metoCLOPramide (REGLAN) injection  10 mg Intravenous Q6H   pantoprazole (PROTONIX) IV  40 mg Intravenous Daily   sodium chloride flush  3 mL Intravenous Q12H  Continuous Infusions:  sodium chloride 20 mL/hr at 09/07/19 0531   dextrose 5 % and 0.9 % NaCl with KCl 20 mEq/L       LOS: 9 days    Time spent: 35 minutes spent on chart review, discussion with nursing staff, consultants, updating family and interview/physical exam; more than 50% of that time was spent in counseling and/or coordination of care.    Latayvia Mandujano J [place] DO [facility] 09/09/2019, 11:01 AM

## 2019-09-09 NOTE — Progress Notes (Signed)
5 Days Post-Op   Subjective/Chief Complaint: Complains of chest pain. On monitor   Objective: Vital signs in last 24 hours: Temp:  [97.4 F (36.3 C)-98.7 F (37.1 C)] 98.1 F (36.7 C) (10/31 0438) Pulse Rate:  [73-112] 89 (10/31 0700) Resp:  [13-27] 18 (10/31 0700) BP: (103-189)/(79-105) 182/89 (10/31 0600) SpO2:  [89 %-100 %] 95 % (10/31 0700) Last BM Date: 09/07/19  Intake/Output from previous day: 10/30 0701 - 10/31 0700 In: 431.7 [I.V.:431.7] Out: 900 [Urine:900] Intake/Output this shift: No intake/output data recorded.  General appearance: alert and cooperative Resp: clear to auscultation bilaterally Cardio: regular rate and rhythm GI: distended and tight. quiet. incision with some bruising but staples intact  Lab Results:  Recent Labs    09/08/19 0149 09/09/19 0217  WBC 18.0* 18.3*  HGB 11.9* 13.2  HCT 39.5 43.1  PLT 308 322   BMET Recent Labs    09/08/19 0149 09/09/19 0217  NA 142 140  K 3.9 4.5  CL 104 104  CO2 29 26  GLUCOSE 99 110*  BUN 30* 26*  CREATININE 0.97 0.79  CALCIUM 8.3* 8.5*   PT/INR No results for input(s): LABPROT, INR in the last 72 hours. ABG No results for input(s): PHART, HCO3 in the last 72 hours.  Invalid input(s): PCO2, PO2  Studies/Results: Dg Abd Portable 1v  Result Date: 09/09/2019 CLINICAL DATA:  Abdominal distension. Status post exploratory laparotomy on 09/04/2019. EXAM: PORTABLE ABDOMEN - 1 VIEW COMPARISON:  09/06/2019 and abdomen pelvis CT dated 09/04/2019. FINDINGS: Multiple dilated small bowel loops with progression. Interval skin clips. There is stool and gas in normal caliber colon. Left hip prosthesis. Mild right hip degenerative changes. Probable distal nasogastric tube in the proximal stomach. IMPRESSION: 1. Progressive small bowel dilatation, most likely representing postoperative ileus. Obstruction is also possible. 2. Probable distal nasogastric tube in the proximal stomach. Electronically Signed   By:  Beckie Salts M.D.   On: 09/09/2019 06:24    Anti-infectives: Anti-infectives (From admission, onward)   Start     Dose/Rate Route Frequency Ordered Stop   09/04/19 1800  fluconazole (DIFLUCAN) IVPB 100 mg  Status:  Discontinued     100 mg 50 mL/hr over 60 Minutes Intravenous Every 24 hours 09/04/19 1626 09/05/19 0909   09/04/19 1800  piperacillin-tazobactam (ZOSYN) IVPB 3.375 g  Status:  Discontinued     3.375 g 12.5 mL/hr over 240 Minutes Intravenous Every 8 hours 09/04/19 1644 09/06/19 0838   09/02/19 1000  doxycycline (VIBRA-TABS) tablet 100 mg  Status:  Discontinued     100 mg Oral Every 12 hours 09/02/19 0941 09/04/19 1627   08/31/19 0245  azithromycin (ZITHROMAX) 500 mg in sodium chloride 0.9 % 250 mL IVPB  Status:  Discontinued     500 mg 250 mL/hr over 60 Minutes Intravenous Daily at bedtime 08/31/19 0239 09/02/19 0941   08/31/19 0130  cefTRIAXone (ROCEPHIN) 1 g in sodium chloride 0.9 % 100 mL IVPB     1 g 200 mL/hr over 30 Minutes Intravenous  Once 08/31/19 0121 08/31/19 0255      Assessment/Plan: s/p Procedure(s): EXPLORATORY LAPAROTOMY (N/A) Continue ng and bowel rest for persistent ileus  COPD, 2L home O2, prednisone 10mg  daily GERD CAD, s/p NSTEMI H/oPE on Xarelto  Acute on Chronic Hypoxemic Respiratory Failure/Acute Exacerbation of COPD-extubated 10/28,per CCM Atrial fibrillation- currently in NSR ABL anemia - Hgb 11.9 from 13.5. hold lovenox  Pneumoperitoneum S/pEXPLORATORY LAPAROTOMY10/26 Dr. - POD#5 -No evidence of any intra-abdominal pathology to explain pneumoperitoneum -  Tolerating clamping trial, passing flatus and had small BM but he is still quite distended. Continue ng for ileus - Continue mobilizing, PT/OT. - Abdominal wound is oozing blood. Reinforce dressing and apply ice packs. Hold anticoagulation.   ID -zosyn 10/26>>10/28, diflucan 10/26>>10/27 FEN -IVF, NG clamped/ CLD VTE -SCDs, hold lovenox Foley - out  LOS: 9 days     Autumn Messing III 09/09/2019

## 2019-09-09 NOTE — Progress Notes (Signed)
NAME:  Theodore [name], MRN:  [id number], DOB:  22-Mar-1956, LOS: 9 ADMISSION DATE:  08/30/2019, CONSULTATION DATE:  10/26 REFERRING MD: Alphonsa Overall, Berle Mull , CHIEF COMPLAINT:  Dyspnea, abdominal pain   Brief History   63 year old man with 2-3L O2 dependent COPD, CAD, PE who presented 10/21 with acute dyspnea thought secondary to AECOPD.  He developed abdominal pain & was found to have pneumoperitoneum on 10/26.  Subsequently was taken for exploratory laparotomy for possible pneumoperitoneum and ischemic small bowel. However, no underlying cause for pneumoperitoneum found. Remained intubated post-operatively, to ICU.  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  To OR with no source found. AF after surgery. 10/26 Ex-lap >> "he had no evidence of ischemia, perforation, or abnormal changes of his small bowel.  He did had a bolus of food with undigested food particles that bulged in the small bowel, and I suspect, that this is what gave the impression of pneumatosis in the small bowel."   10/27 Bronchospastic on vent, failed wean  10/28 Extubated  Consults:  Surgery   Procedures:  ETT 10/26 >> 10/28  Significant Diagnostic Tests:   ECHO 10/22 >> LVEF 65-70%, hyperdynamic function, no LVH, RV mildly enlarged, global RV function normal, LA normal, RA normal  ABD CXR 10/26 >> moderate volume pneumoperitoneum concerning for hollow viscus perforation, dilated loops of small bowel concerning for SBO, RUL airspace opacity, large amt stool in right hemicolon  CT ABD 10/26 >> large volume of  abdominopelvic pneumoperitoneum with dilated abnormal appearing small bowel in the left abdomen with fecalization and pneumatosis suspicious for ischemic small bowel, no significant free fluid, hemorrhage or abscess  Micro Data:  COVID 10/21 >> negative  MRSA PCR 10/26 >> negative   Antimicrobials:  Ceftriaxone x1 10/21  Azithromycin 10/21 >> 10/23  Doxycycline 1024 >> 10/26 Fluconazole 10/26 >> 10/27 Zosyn 10/26 >> 10/28  Interim history/subjective:  Abd more distended today. Patient weak. Abd hurts a bit. HD stable, resp status stable.  Objective   Blood pressure (!) 165/82, pulse 82, temperature 98 F (36.7 C), temperature source Oral, resp. rate 17, height 6\' 4"  (1.93 m), weight 103 kg, SpO2 91 %.        Intake/Output Summary (Last 24 hours) at 09/09/2019 0931 Last data filed at 09/08/2019 2200 Gross per 24 hour  Intake 360.54 ml  Output 900 ml  Net -539.46 ml   Filed Weights   08/30/19 2331 09/04/19 2316  Weight: 104.3 kg 103 kg    Examination: GEN: elderly man in NAD HEENT: MMM, no thrush, NGT clamped CV: RRR, ext warm PULM: Slightly better air movement today, no accessory muscle use GI: Distended, tympanic to percussion, dressings in place EXT: No edema NEURO: moves all 4 ext to command PSYCH: RASS 0  SKIN:No rashes  Cr good CBC stable 900 cc [facility] Problem list     Assessment & Plan:   # Idiopathic Pneumoperitoneum- ex lap unrevealing # Postop ileus- abd more distended today, given baseline tenuous resp status would place NGT back to suction # AECOPD- a bit better, decreased FRC from ileus  not helping # Afib- resolved, needs AC at some point for this and hx of PE  - NGT back to suction - Suppositories, start reglan - Pulmonary toileting and nebs as ordered, continue IV steroids for now until reliable PO access - Await return of bowel function - Up to chair daily - Will follow with you  Myrla Halsted MD

## 2019-09-10 LAB — CBC
HCT: 40.9 % (ref 39.0–52.0)
Hemoglobin: 12.4 g/dL — ABNORMAL LOW (ref 13.0–17.0)
MCH: 31.6 pg (ref 26.0–34.0)
MCHC: 30.3 g/dL (ref 30.0–36.0)
MCV: 104.1 fL — ABNORMAL HIGH (ref 80.0–100.0)
Platelets: 290 10*3/uL (ref 150–400)
RBC: 3.93 MIL/uL — ABNORMAL LOW (ref 4.22–5.81)
RDW: 13.4 % (ref 11.5–15.5)
WBC: 17.9 10*3/uL — ABNORMAL HIGH (ref 4.0–10.5)
nRBC: 0 % (ref 0.0–0.2)

## 2019-09-10 LAB — BASIC METABOLIC PANEL
Anion gap: 10 (ref 5–15)
BUN: 23 mg/dL (ref 8–23)
CO2: 27 mmol/L (ref 22–32)
Calcium: 8.1 mg/dL — ABNORMAL LOW (ref 8.9–10.3)
Chloride: 106 mmol/L (ref 98–111)
Creatinine, Ser: 0.73 mg/dL (ref 0.61–1.24)
GFR calc Af Amer: >60 mL/min (ref >60)
GFR calc non Af Amer: >60 mL/min (ref >60)
Glucose, Bld: 112 mg/dL — ABNORMAL HIGH (ref 70–99)
Potassium: 4.2 mmol/L (ref 3.5–5.1)
Sodium: 143 mmol/L (ref 135–145)

## 2019-09-10 LAB — GLUCOSE, CAPILLARY: Glucose-Capillary: 107 mg/dL — ABNORMAL HIGH (ref 70–99)

## 2019-09-10 LAB — MAGNESIUM: Magnesium: 2.3 mg/dL (ref 1.7–2.4)

## 2019-09-10 MED ORDER — ENOXAPARIN SODIUM 100 MG/ML ~~LOC~~ SOLN
100.0000 mg | Freq: Two times a day (BID) | SUBCUTANEOUS | Status: DC
  Filled 2019-09-10: qty 1

## 2019-09-10 MED ORDER — ENOXAPARIN SODIUM 40 MG/0.4ML ~~LOC~~ SOLN
40.0000 mg | SUBCUTANEOUS | Status: DC
  Administered 2019-09-10: 14:00:00 40 mg via SUBCUTANEOUS
  Filled 2019-09-10: qty 0.4

## 2019-09-10 NOTE — Progress Notes (Signed)
PROGRESS NOTE    Theodore GuestJames W Simonin  ZOX:[id number] DOB: Jul 01, 1956 DOA: 08/30/2019 PCP: [name], Khiyan W, MD    Brief Narrative:   Theodore [name] is a 63 y.o. male with medical history significant for steroid-dependent COPD, chronic hypoxic respiratory failure, coronary artery disease, chronic leg swelling, and history of PE on Xarelto, now presenting to the emergency department for evaluation of shortness of breath.  He is found to be hypoxic with labored respirations with chest x-ray notable for chronic hyperinflation, emphysema without acute findings.  Covid-19 was negative.  Patient was originally admitted for underlying COPD exacerbation and treatment with steroids, breathing treatments and antibiotics.  During the hospitalization, patient developed abdominal pain on 09/04/2019 with CT abdomen/pelvis findings of pneumoperitoneum, and he underwent exploratory laparotomy without any significant findings to suggest any intra-abdominal pathology to explain the CAT scan findings.  He was difficult to wean from the ventilator, and was maintained on ventilatory support for 48 hours prior to successful extubation on 09/06/2019.  He continues with significant debility postoperative ileus.  [facility]  Assessment & Plan:   Principal Problem:   COPD with acute exacerbation (HCC) Active Problems:   COPD GOLD IV    COPD exacerbation (HCC)   Chronic respiratory failure assoc with cor pulmonale   CAD (coronary artery disease)   DOE (dyspnea on exertion)   Chronic anticoagulation   Chronic respiratory failure with hypoxia (HCC)   Recurrent pulmonary emboli (HCC)   Hyponatremia   Acute on chronic respiratory failure with hypoxia (HCC)   Pneumoperitoneum   Current chronic use of systemic steroids   Pressure injury of skin   Acute on chronic hypoxic respiratory failure Acute COPD exacerbation Patient presenting to ED with progressive  shortness of breath associated with increased respiratory drive likely secondary to his underlying COPD.  Patient 2-3 L oxygen at baseline.  Home medications include Symbicort, Singulair, prednisone 10 mg p.o. daily.  Chest x-ray notable for hyperexpanded lungs without other acute findings.  Hospital course complicated by exploratory laparotomy in which she was difficult to extubate postoperatively, remained on ventilatory support for 48 hours before being extubated on 09/06/2019. --Supplemental oxygen currently weaned down to 2 L nasal cannula, goal SPO2 >88% --Nebs with Brovana and Pulmicort twice daily --Duo nebs every 6 hours scheduled --Solu-Medrol 40 mg IV daily; until can tolerate oral --Incentive spirometry hourly while awake and encourage increased mobilization  Pneumoperitoneum Postoperative ileus On 09/04/2019, patient developed abdominal pain.  Was noted to have pneumoperitoneum on CT abdomen/pelvis.  Emergently taken to the OR by general surgery and underwent exploratory laparotomy by Dr. Ezzard StandingNewman without any findings to suggest etiology noted on CT scan. --General surgery following, appreciate assistance --Continues with abdominal distention, reports flatus, no BMs past 24 hours --KUB 10/31 with progressive SBO likely postoperative ileus versus obstruction --continue NPO and NGT to suction per surgery today  Acute blood loss anemia Drop 13.5 to 11.9 on 10/30; 12.4 today.  Blood oozing at surgical site dressing.  Currently holding anticoagulation. --Per surgery, avoid frequent dressing changes and provide reinforcement/ice packs --Continue to hold anticoagulation --Continue to monitor CBC daily  Paroxysmal atrial fibrillation New onset noted on 09/05/2019.  Likely exacerbated by acute illness.  Currently in normal sinus rhythm. On Xarelto outpatient for history of PE, currently on hold secondary to ABLA/oozing at surgical site. --Continue to monitor on telemetry --metoprolol IV PRN  q3h to maintain HR <130  Essential hypertension On Maxide outpatient.  BP 158/78 this morning.  Continues to be n.p.o. secondary to his postoperative ileus; now NG tube back on LWS --Unable to use oral medications at this time --Labetalol 10mg  IV prn for SBP >170 or DBP >110 --hydralazine IV prn --Continue to monitor blood pressures closely  Bladder spasms: resolved Likely etiology from clogged Foley catheter, in which Foley was discontinued on 09/07/2019.  Patient reports no further issues.  Hx PE On Xarelto outpatient.  Currently holding anticoagulation due to acute blood loss anemia with oozing at surgical site. --Continue to monitor CBC daily   DVT prophylaxis: SCDs, holding Lovenox secondary to ABLA/oozing from surgical site Code Status: Full code Family Communication: None Disposition Plan: Continue inpatient, SDU level of care; further dependent on clinical course, therapy currently recommending SNF placement.   Consultants:   PCCM  General surgery  Procedures:   Exploratory laparotomy 10/26, Dr. 11/26  Intubation; extubated 09/06/2019  Foley catheter, removed 09/07/2019  Antimicrobials:   Zosyn 10/26 - 10/28  Diflucan 10/26 - 10/28   Subjective: Patient seen and examined at bedside, nursing present.  Continues to complain of fatigue/weakness, mild abdominal discomfort with abdominal distention.  Placed back on NG tube to low wall suction yesterday by general surgery for persistent ileus versus obstruction.  700 mL out past 24 hours via NGT.  Reports flatus, no bowel movements.  Breathing improved, close to baseline.  Continues with some oozing at surgical site.  No other specific complaints or concerns at this time.  Denies headache, no fever/chills/night sweats, no nausea/vomiting/diarrhea, no chest pain, no palpitations, no abdominal discomfort, no paresthesias.  No acute events overnight per nursing staff.  Objective: Vitals:   09/10/19 0800 09/10/19 0830  09/10/19 0832 09/10/19 1000  BP: (!) 148/78   132/73  Pulse: 88   91  Resp: 15   (!) 21  Temp: 98.5 F (36.9 C)     TempSrc: Oral     SpO2: 100% 100% 99% (!) 81%  Weight:      Height:        Intake/Output Summary (Last 24 hours) at 09/10/2019 1119 Last data filed at 09/10/2019 1056 Gross per 24 hour  Intake 1746.33 ml  Output 1525 ml  Net 221.33 ml   Filed Weights   08/30/19 2331 09/04/19 2316  Weight: 104.3 kg 103 kg    Examination:  General exam: Appears calm and comfortable; appears older than stated age Respiratory system: Coarse breath sounds bilaterally with slightly decreased bases with mild late expiratory wheezing, normal respiratory effort, on 2 L nasal cannula Cardiovascular system: S1 & S2 heard, RRR. No JVD, murmurs, rubs, gallops or clicks. No pedal edema. Gastrointestinal system: Abdomen is nondistended, soft and nontender. No organomegaly or masses felt. Normal bowel sounds heard.  Surgical site noted with dressing in place with slightly blood-tinged dressing Central nervous system: Alert and oriented. No focal neurological deficits. Extremities: Symmetric 5 x 5 power. Skin: No rashes, lesions or ulcers Psychiatry: Judgement and insight appear normal. Mood & affect appropriate.     Data Reviewed: I have personally reviewed following labs and imaging studies  CBC: Recent Labs  Lab 09/06/19 0144 09/07/19 0513 09/08/19 0149 09/09/19 0217 09/10/19 0230  WBC 20.4* 21.4* 18.0* 18.3* 17.9*  HGB 14.0 13.5 11.9* 13.2 12.4*  HCT 43.7 42.8 39.5 43.1 40.9  MCV 98.4 100.0 103.1* 102.4* 104.1*  PLT 280 303 308 322 290   Basic Metabolic Panel: Recent Labs  Lab 09/06/19 0144 09/07/19 0513 09/08/19 0149 09/09/19 0217 09/10/19 0230  NA 136 139 142 140 143  K 3.4* 4.2 3.9 4.5 4.2  CL 98 100 104 104 106  CO2 GLUCOSE 146* 137* 99 110* 112*  BUN 31* 30* 30* 26* 23  CREATININE 1.23 1.09 0.97 0.79 0.73  CALCIUM 7.9* 8.3* 8.3* 8.5* 8.1*  MG  2.3 2.4 2.4 2.3 2.3   GFR: Estimated Creatinine Clearance: 113 mL/min (by C-G formula based on SCr of 0.73 mg/dL). Liver Function Tests: No results for input(s): AST, ALT, ALKPHOS, BILITOT, PROT, ALBUMIN in the last 168 hours. No results for input(s): LIPASE, AMYLASE in the last 168 hours. No results for input(s): AMMONIA in the last 168 hours. Coagulation Profile: No results for input(s): INR, PROTIME in the last 168 hours. Cardiac Enzymes: No results for input(s): CKTOTAL, CKMB, CKMBINDEX, TROPONINI in the last 168 hours. BNP (last 3 results) No results for input(s): PROBNP in the last 8760 hours. HbA1C: No results for input(s): HGBA1C in the last 72 hours. CBG: Recent Labs  Lab 09/06/19 0758 09/07/19 0826 09/08/19 0740 09/09/19 0819 09/10/19 0842  GLUCAP 116* 103* 78 100* 107*   Lipid Profile: No results for input(s): CHOL, HDL, LDLCALC, TRIG, CHOLHDL, LDLDIRECT in the last 72 hours. Thyroid Function Tests: No results for input(s): TSH, T4TOTAL, FREET4, T3FREE, THYROIDAB in the last 72 hours. Anemia Panel: No results for input(s): VITAMINB12, FOLATE, FERRITIN, TIBC, IRON, RETICCTPCT in the last 72 hours. Sepsis Labs: No results for input(s): PROCALCITON, LATICACIDVEN in the last 168 hours.  Recent Results (from the past 240 hour(s))  Surgical PCR screen     Status: None   Collection Time: 09/04/19  7:51 PM   Specimen: Nasal Mucosa; Nasal Swab  Result Value Ref Range Status   MRSA, PCR NEGATIVE NEGATIVE Final   Staphylococcus aureus NEGATIVE NEGATIVE Final    Comment: (NOTE) The Xpert SA Assay (FDA approved for NASAL specimens in patients 27 years of age and older), is one component of a comprehensive surveillance program. It is not intended to diagnose infection nor to guide or monitor treatment. Performed at [facility], [address], [place], [place] [address]   MRSA PCR Screening     Status: None   Collection Time: 09/04/19 10:29 PM    Specimen: Nasal Mucosa; Nasopharyngeal  Result Value Ref Range Status   MRSA by PCR NEGATIVE NEGATIVE Final    Comment:        The GeneXpert MRSA Assay (FDA approved for NASAL specimens only), is one component of a comprehensive MRSA colonization surveillance program. It is not intended to diagnose MRSA infection nor to guide or monitor treatment for MRSA infections. Performed at [facility], [address], [place], [place] [address]          Radiology Studies: Dg Abd Portable 1v  Result Date: 09/09/2019 CLINICAL DATA:  Abdominal distension. Status post exploratory laparotomy on 09/04/2019. EXAM: PORTABLE ABDOMEN - 1 VIEW COMPARISON:  09/06/2019 and abdomen pelvis CT dated 09/04/2019. FINDINGS: Multiple dilated small bowel loops with progression. Interval skin clips. There is stool and gas in normal caliber colon. Left hip prosthesis. Mild right hip degenerative changes. Probable distal nasogastric tube in the proximal stomach. IMPRESSION: 1. Progressive small bowel dilatation, most likely representing postoperative ileus. Obstruction is also possible. 2. Probable distal nasogastric tube in the proximal stomach. Electronically Signed   By: Beckie Salts M.D.   On: 09/09/2019 06:24        Scheduled Meds: . arformoterol  15 mcg Nebulization BID  . bisacodyl  10 mg Rectal Daily  . budesonide (PULMICORT) nebulizer solution  0.5 mg Nebulization BID  . chlorhexidine  15 mL Mouth Rinse BID  . Chlorhexidine Gluconate Cloth  6 each Topical Daily  . enoxaparin (LOVENOX) injection  100 mg Subcutaneous BID  . fluticasone  2 spray Each Nare Daily  . ipratropium-albuterol  3 mL Nebulization Q6H  . lip balm  1 application Topical BID  . mouth rinse  15 mL Mouth Rinse q12n4p  . methylPREDNISolone (SOLU-MEDROL) injection  40 mg Intravenous Daily  . metoCLOPramide (REGLAN) injection  10 mg Intravenous Q6H  . pantoprazole (PROTONIX) IV  40 mg Intravenous Daily  . sodium  chloride flush  3 mL Intravenous Q12H   Continuous Infusions: . sodium chloride 20 mL/hr at 09/07/19 0531  . dextrose 5 % and 0.9 % NaCl with KCl 20 mEq/L 100 mL/hr at 09/10/19 1038     LOS: 10 days    Time spent: 35 minutes spent on chart review, discussion with nursing staff, consultants, updating family and interview/physical exam; more than 50% of that time was spent in counseling and/or coordination of care.     J [place] DO [facility] 09/10/2019, 11:19 AM

## 2019-09-10 NOTE — Progress Notes (Signed)
Patient assisted to bedside commode (had BM) patient became short of breath with tachypnea and wheezing . RT gave 2 prn breathing treatments. Patient states he feels extremely tight and is not breathing well . Oxygen level  96% and respirations  24. Dr. Tamala Julian notified and was  on unit to assess patient.  Patient placed on Venti mask at 4 liter 31%.

## 2019-09-10 NOTE — Progress Notes (Addendum)
Apparently still oozing from staple site, will switch lovenox to ppx dose.     NAME:  Theodore [name], MRN:  [id number], DOB:  November 28, 1955, LOS: 10 ADMISSION DATE:  08/30/2019, CONSULTATION DATE:  10/26 REFERRING MD: Ovidio Kin, Lynden Oxford , CHIEF COMPLAINT:  Dyspnea, abdominal pain   Brief History   63 year old man with 2-3L O2 dependent COPD, CAD, PE who presented 10/21 with acute dyspnea thought secondary to AECOPD.  He developed abdominal pain & was found to have pneumoperitoneum on 10/26.  Subsequently was taken for exploratory laparotomy for possible pneumoperitoneum and ischemic small bowel. However, no underlying cause for pneumoperitoneum found. Remained intubated post-operatively, to ICU.  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  To OR with no source found. AF after surgery. 10/26 Ex-lap >> "he had no evidence of ischemia, perforation, or abnormal changes of his small bowel.  He did had a bolus of food with undigested food particles that bulged in the small bowel, and I suspect, that this is what gave the impression of pneumatosis in the small bowel."   10/27 Bronchospastic on vent, failed wean  10/28 Extubated  Consults:  Surgery   Procedures:  ETT 10/26 >> 10/28  Significant Diagnostic Tests:   ECHO 10/22 >> LVEF 65-70%, hyperdynamic function, no LVH, RV mildly enlarged, global RV function normal, LA normal, RA normal  ABD CXR 10/26 >> moderate volume pneumoperitoneum concerning for hollow viscus perforation, dilated loops of small bowel concerning for SBO, RUL airspace  opacity, large amt stool in right hemicolon  CT ABD 10/26 >> large volume of abdominopelvic pneumoperitoneum with dilated abnormal appearing small bowel in the left abdomen with fecalization and pneumatosis suspicious for ischemic small bowel, no significant free fluid, hemorrhage or abscess  Micro Data:  COVID 10/21 >> negative  MRSA PCR 10/26 >> negative   Antimicrobials:  Ceftriaxone x1 10/21  Azithromycin 10/21 >> 10/23  Doxycycline 1024 >> 10/26 Fluconazole 10/26 >> 10/27 Zosyn 10/26 >> 10/28  Interim history/subjective:  Doing well, passing gas NGT was back on suction yesterday with 500cc bilious fluid removed Breathing status stable, coughing up whitish sputum  Objective   Blood pressure (!) 158/78, pulse 85, temperature (!) 97.5 F (36.4 C), temperature source Oral, resp. rate 13, height 6\' 4"  (1.93 m), weight 103 kg, SpO2 98 %.        Intake/Output Summary (Last 24 hours) at 09/10/2019 0820 Last data filed at 09/10/2019 0700 Gross per 24 hour  Intake 1746.33 ml  Output 1300 ml  Net 446.33 ml   Filed Weights   08/30/19 2331 09/04/19 2316  Weight: 104.3 kg 103 kg    Examination: GEN: elderly man in NAD HEENT: MMM, no thrush, NGT with minimal output at this time CV: RRR, ext warm PULM:Diminished, no accessory muscle use GI: softer than yesterday, hypoactive BS dressings in place EXT: compression stockings in place NEURO: moves all 4 ext to command PSYCH: RASS 0  SKIN:No rashes  Cr good CBC stable 600 cc UoP documented  Resolved Hospital Problem list     Assessment & Plan:   # Idiopathic Pneumoperitoneum- ex lap unrevealing #  Postop ileus- persistent # AECOPD- a bit better, decreased FRC from ileus not helping # Afib, hx of PE- needs AC restarted  - NGT clamping trial again - Pulmonary toileting and nebs as ordered, continue IV steroids for now until reliable PO access - Await return of bowel function - Up to chair daily - Start lovenox treatment  dose - Will follow with you  Erskine Emery MD

## 2019-09-10 NOTE — Progress Notes (Signed)
6 Days Post-Op   Subjective/Chief Complaint: Feels a little better today. Feels less distended   Objective: Vital signs in last 24 hours: Temp:  [97.5 F (36.4 C)-98.4 F (36.9 C)] 97.5 F (36.4 C) (11/01 0400) Pulse Rate:  [80-104] 85 (11/01 0600) Resp:  [12-19] 13 (11/01 0600) BP: (139-186)/(74-110) 158/78 (11/01 0600) SpO2:  [91 %-100 %] 98 % (11/01 0600) Last BM Date: 09/07/19  Intake/Output from previous day: 10/31 0701 - 11/01 0700 In: 1746.3 [I.V.:1746.3] Out: 1300 [Urine:600; Emesis/NG output:700] Intake/Output this shift: No intake/output data recorded.  General appearance: alert and cooperative Resp: clear to auscultation bilaterally Cardio: regular rate and rhythm GI: slightly less distended than yesterday  Lab Results:  Recent Labs    09/09/19 0217 09/10/19 0230  WBC 18.3* 17.9*  HGB 13.2 12.4*  HCT 43.1 40.9  PLT 322 290   BMET Recent Labs    09/09/19 0217 09/10/19 0230  NA 140 143  K 4.5 4.2  CL 104 106  CO2 26 27  GLUCOSE 110* 112*  BUN 26* 23  CREATININE 0.79 0.73  CALCIUM 8.5* 8.1*   PT/INR No results for input(s): LABPROT, INR in the last 72 hours. ABG No results for input(s): PHART, HCO3 in the last 72 hours.  Invalid input(s): PCO2, PO2  Studies/Results: Dg Abd Portable 1v  Result Date: 09/09/2019 CLINICAL DATA:  Abdominal distension. Status post exploratory laparotomy on 09/04/2019. EXAM: PORTABLE ABDOMEN - 1 VIEW COMPARISON:  09/06/2019 and abdomen pelvis CT dated 09/04/2019. FINDINGS: Multiple dilated small bowel loops with progression. Interval skin clips. There is stool and gas in normal caliber colon. Left hip prosthesis. Mild right hip degenerative changes. Probable distal nasogastric tube in the proximal stomach. IMPRESSION: 1. Progressive small bowel dilatation, most likely representing postoperative ileus. Obstruction is also possible. 2. Probable distal nasogastric tube in the proximal stomach. Electronically Signed   By:  Claudie Revering M.D.   On: 09/09/2019 06:24    Anti-infectives: Anti-infectives (From admission, onward)   Start     Dose/Rate Route Frequency Ordered Stop   09/04/19 1800  fluconazole (DIFLUCAN) IVPB 100 mg  Status:  Discontinued     100 mg 50 mL/hr over 60 Minutes Intravenous Every 24 hours 09/04/19 1626 09/05/19 0909   09/04/19 1800  piperacillin-tazobactam (ZOSYN) IVPB 3.375 g  Status:  Discontinued     3.375 g 12.5 mL/hr over 240 Minutes Intravenous Every 8 hours 09/04/19 1644 09/06/19 0838   09/02/19 1000  doxycycline (VIBRA-TABS) tablet 100 mg  Status:  Discontinued     100 mg Oral Every 12 hours 09/02/19 0941 09/04/19 1627   08/31/19 0245  azithromycin (ZITHROMAX) 500 mg in sodium chloride 0.9 % 250 mL IVPB  Status:  Discontinued     500 mg 250 mL/hr over 60 Minutes Intravenous Daily at bedtime 08/31/19 0239 09/02/19 0941   08/31/19 0130  cefTRIAXone (ROCEPHIN) 1 g in sodium chloride 0.9 % 100 mL IVPB     1 g 200 mL/hr over 30 Minutes Intravenous  Once 08/31/19 0121 08/31/19 0255      Assessment/Plan: s/p Procedure(s): EXPLORATORY LAPAROTOMY (N/A) Continue ng and bowel rest. Post op ileus Wbc slowly improving. Continue IV zosyn COPD, 2L home O2, prednisone 10mg  daily GERD CAD, s/p NSTEMI H/oPE on Xarelto  Acute on Chronic Hypoxemic Respiratory Failure/Acute Exacerbation of COPD-extubated 10/28,per CCM Atrial fibrillation- currently in NSR ABL anemia - Hgb 11.9 from 13.5. hold lovenox  Pneumoperitoneum S/pEXPLORATORY LAPAROTOMY10/26 Dr. Lucia Gaskins - POD#6 -No evidence of any intra-abdominal pathology  to explain pneumoperitoneum -Tolerating clamping trial, passing flatus and had small BM but he is still quite distended. Continue ng for ileus - Continue mobilizing, PT/OT. - Abdominal wound is oozing blood. Reinforce dressing and apply ice packs. Hold anticoagulation.  ID -zosyn 10/26>>10/28, diflucan 10/26>>10/27 FEN -IVF, NG clamped/CLD VTE  -SCDs,holdlovenox Foley -out  LOS: 10 days    Theodore [name] 09/10/2019

## 2019-09-11 ENCOUNTER — Inpatient Hospital Stay (HOSPITAL_COMMUNITY): Payer: PPO

## 2019-09-11 LAB — BASIC METABOLIC PANEL
Anion gap: 9 (ref 5–15)
BUN: 19 mg/dL (ref 8–23)
CO2: 25 mmol/L (ref 22–32)
Calcium: 8 mg/dL — ABNORMAL LOW (ref 8.9–10.3)
Chloride: 111 mmol/L (ref 98–111)
Creatinine, Ser: 0.68 mg/dL (ref 0.61–1.24)
GFR calc Af Amer: >60 mL/min (ref >60)
GFR calc non Af Amer: >60 mL/min (ref >60)
Glucose, Bld: 119 mg/dL — ABNORMAL HIGH (ref 70–99)
Potassium: 4.1 mmol/L (ref 3.5–5.1)
Sodium: 145 mmol/L (ref 135–145)

## 2019-09-11 LAB — CBC
HCT: 38.3 % — ABNORMAL LOW (ref 39.0–52.0)
Hemoglobin: 11.5 g/dL — ABNORMAL LOW (ref 13.0–17.0)
MCH: 31.9 pg (ref 26.0–34.0)
MCHC: 30 g/dL (ref 30.0–36.0)
MCV: 106.4 fL — ABNORMAL HIGH (ref 80.0–100.0)
Platelets: 280 10*3/uL (ref 150–400)
RBC: 3.6 MIL/uL — ABNORMAL LOW (ref 4.22–5.81)
RDW: 13.6 % (ref 11.5–15.5)
WBC: 15.1 10*3/uL — ABNORMAL HIGH (ref 4.0–10.5)
nRBC: 0 % (ref 0.0–0.2)

## 2019-09-11 LAB — MAGNESIUM: Magnesium: 2.1 mg/dL (ref 1.7–2.4)

## 2019-09-11 LAB — GLUCOSE, CAPILLARY: Glucose-Capillary: 105 mg/dL — ABNORMAL HIGH (ref 70–99)

## 2019-09-11 MED ORDER — FUROSEMIDE 10 MG/ML IJ SOLN
40.0000 mg | Freq: Once | INTRAMUSCULAR | Status: AC
  Administered 2019-09-11: 09:00:00 40 mg via INTRAVENOUS
  Filled 2019-09-11: qty 4

## 2019-09-11 MED ORDER — PHENOL 1.4 % MT LIQD
1.0000 | OROMUCOSAL | Status: DC | PRN
  Filled 2019-09-11: qty 177

## 2019-09-11 MED ORDER — PANTOPRAZOLE SODIUM 40 MG IV SOLR
40.0000 mg | Freq: Two times a day (BID) | INTRAVENOUS | Status: DC
  Administered 2019-09-11 – 2019-09-14 (×6): 40 mg via INTRAVENOUS
  Filled 2019-09-11 (×6): qty 40

## 2019-09-11 MED ORDER — ENOXAPARIN SODIUM 100 MG/ML ~~LOC~~ SOLN
100.0000 mg | Freq: Two times a day (BID) | SUBCUTANEOUS | Status: DC
  Filled 2019-09-11 (×3): qty 1

## 2019-09-11 MED ORDER — METOCLOPRAMIDE HCL 5 MG/ML IJ SOLN
5.0000 mg | Freq: Four times a day (QID) | INTRAMUSCULAR | Status: DC
  Administered 2019-09-11 – 2019-09-12 (×4): 5 mg via INTRAVENOUS
  Filled 2019-09-11 (×4): qty 2

## 2019-09-11 NOTE — Progress Notes (Signed)
Pt. tolerating Venti mask well at this time with good oxygen saturations, V-60 BiPAP remains on STBY in room if needed, RT to monitor.

## 2019-09-11 NOTE — Progress Notes (Signed)
PT Cancellation Note  Patient Details Name: Theodore [name] MRN: [id number] DOB: 04-May-1956   Cancelled Treatment:    Reason Eval/Treat Not Completed: Attempted PT tx session-pt declined participation on today. He is currently on VM O2 and he doesn't feel like he can tolerate PT. He is agreeable to PT checking back another day.   Weston Anna, PT Acute Rehabilitation Services Pager: [phone] Office: [phone]

## 2019-09-11 NOTE — Progress Notes (Signed)
ANTICOAGULATION CONSULT NOTE   Pharmacy Consult for lovenox Indication: hx pulmonary embolus; new onset afib  Allergies  Allergen Reactions  . Naproxen Sodium Itching    Patient Measurements: Height: 6\' 4"  (193 cm) Weight: 227 lb 1.2 oz (103 kg) IBW/kg (Calculated) : 86.8 Heparin Dosing Weight:   Vital Signs: Temp: 98 F (36.7 C) (11/02 0700) Temp Source: Oral (11/02 0700) BP: 130/74 (11/02 0813) Pulse Rate: 102 (11/02 0813)  Labs: Recent Labs    09/09/19 0217 09/10/19 0230 09/11/19 0259  HGB 13.2 12.4* 11.5*  HCT 43.1 40.9 38.3*  PLT 322 290 280  CREATININE 0.79 0.73 0.68    Estimated Creatinine Clearance: 113 mL/min (by C-G formula based on SCr of 0.68 mg/dL).   Medications:  - on xarelto 20 mg daily PTA  Assessment: Patient's a 63 y.o M with hx PE on xarelto PTA presented to the ED on 10/21 with SOB.  She was found to have pneumoperitoneum and underwent expl lap on 10/26.  He remained intubated after the procedure was transferred to the ICU.  He was found to have new onset afib on 10/27. Pharmacy was consulted to transition xarelto to lovenox on 10/27.  Significant Events: - 10/29 PM: abd wound oozing blood; LMWH dose held starting with PM dose - 10/30: blood oozing at incision site improves--> per CCS, cont to hold [facility] for now - 10/31: abd wound still oozing blood. Per CCS, cont to hold [facility] - 11/1: per CCM, resumed LMWH but at VTE px dose 40 mg daily; some bleeding still noted with dressing change - 11/2: RN said blood still noted at incision site but bleeding is a lot less than before. Spoke to Dr. Harlow Asa, he said ok to resume FULL dose LMWH back today   Today, 09/11/2019: - hgb low and trending down (11.5), plts ok - RN said blood still noted at incision site but bleeding is a lot less than before - crcl>30 - in afib  Goal of Therapy:  Anti-Xa level 0.6-1 units/ml 4hrs after LMWH dose given Monitor platelets by anticoagulation protocol: Yes   Plan:  -  resume Lovenox 100 mg SQ q12h - monitor for severity of bleeding at incision site  Tosca Pletz P 09/11/2019,9:51 AM

## 2019-09-11 NOTE — Progress Notes (Signed)
OT Cancellation Note  Patient Details Name: Theodore [name] MRN: [id number] DOB: [birth year]   Cancelled Treatment:    Reason Eval/Treat Not Completed: Other (comment) Pt lying in bed when OT presents for treatment. Pt reports increased fatigue and work of breathing. Pt on VM O2 and does not feel he can tolerate therapy today. Will f/u next schedule date as able and as pt can tolerate.   Sharren Bridge  Pager [phone] 09/11/2019, 3:50 PM

## 2019-09-11 NOTE — Progress Notes (Signed)
Orthopedic Tech Progress Note Patient Details:  Clevester Helzer [facility] [birth year] [id number] Application was done by Sander Nephew and I was there to assist he. Ortho Devices Type of Ortho Device: Haematologist Ortho Device/Splint Location: B-lat Ortho Device/Splint Interventions: Application   Post Interventions Patient Tolerated: Well Instructions Provided: Adjustment of device, Care of device   Ladell Pier [facility] 09/11/2019, 3:47 PM

## 2019-09-11 NOTE — Progress Notes (Signed)
     Assessment & Plan:  Pneumoperitoneum POD#6 - status postEXPLORATORY LAPAROTOMY- 10/26 Dr. Lucia Gaskins -No evidence of any intra-abdominal pathology to explain pneumoperitoneum -BM yesterday - will clamp NG and remove later today if tolerated - OOB, mobilize, PT/OT - dry dressing to abd wound - no further bleeding/oozing overnight - WBC down to 15K - continue IV zosyn   GERD CAD, hx of NSTEMI Hx ofPE on Xarelto Acute on Chronic Hypoxemic Respiratory Failure/Acute Exacerbation of COPD-extubated 10/28,per CCM Atrial fibrillation- currently in NSR ABL anemia - Hgb 11.9 from 13.5  ID -zosyn 10/26>>10/28, diflucan 10/26>>10/27 FEN -IVF, NG clamped/CLD VTE -SCDs,holdlovenox Foley -out        Theodore Gemma, MD       [facility]       Office: [phone]   Chief Complaint: Status post ex lap for pneumoperitoneum of unknown source  Subjective: Patient in ICU, supplemental oxygen  Objective: Vital signs in last 24 hours: Temp:  [97.3 F (36.3 C)-98.6 F (37 C)] 98 F (36.7 C) (11/02 0700) Pulse Rate:  [80-118] 95 (11/02 0810) Resp:  [11-25] 20 (11/02 0810) BP: (122-182)/(67-112) 130/112 (11/02 0624) SpO2:  [81 %-100 %] 96 % (11/02 0810) FiO2 (%):  [30 %-31 %] 31 % (11/02 0810) Last BM Date: 09/07/19  Intake/Output from previous day: 11/01 0701 - 11/02 0700 In: 1330 [I.V.:1330] Out: 1375 [Urine:1325; Emesis/NG output:50] Intake/Output this shift: No intake/output data recorded.  Physical Exam: HEENT - sclerae clear, mucous membranes moist Neck - soft Abdomen - moderate distension; midline wound intact with substantial surrounding ecchymosis, no drainage  Lab Results:  Recent Labs    09/10/19 0230 09/11/19 0259  WBC 17.9* 15.1*  HGB 12.4* 11.5*  HCT 40.9 38.3*  PLT 290 280   BMET Recent Labs    09/10/19 0230 09/11/19 0259  NA 143 145  K 4.2 4.1  CL 106 111  CO2 27 25  GLUCOSE 112* 119*  BUN 23 19  CREATININE  0.73 0.68  CALCIUM 8.1* 8.0*   PT/INR No results for input(s): LABPROT, INR in the last 72 hours. Comprehensive Metabolic Panel:    Component Value Date/Time   NA 145 09/11/2019 0259   NA 143 09/10/2019 0230   NA 140 08/07/2017   NA 136 (A) 07/28/2017   K 4.1 09/11/2019 0259   K 4.2 09/10/2019 0230   CL 111 09/11/2019 0259   CL 106 09/10/2019 0230   CO2 25 09/11/2019 0259   CO2 27 09/10/2019 0230   BUN 19 09/11/2019 0259   BUN 23 09/10/2019 0230   BUN 12 08/07/2017   BUN 10 07/28/2017   CREATININE 0.68 09/11/2019 0259   CREATININE 0.73 09/10/2019 0230   GLUCOSE 119 (H) 09/11/2019 0259   GLUCOSE 112 (H) 09/10/2019 0230   CALCIUM 8.0 (L) 09/11/2019 0259   CALCIUM 8.1 (L) 09/10/2019 0230   AST 21 09/01/2019 0309   AST 19 08/31/2019 0321   ALT 15 09/01/2019 0309   ALT 17 08/31/2019 0321   ALKPHOS 47 09/01/2019 0309   ALKPHOS 54 08/31/2019 0321   BILITOT 0.8 09/01/2019 0309   BILITOT 0.6 08/31/2019 0321   PROT 6.2 (L) 09/01/2019 0309   PROT 6.7 08/31/2019 0321   ALBUMIN 3.3 (L) 09/01/2019 0309   ALBUMIN 3.7 08/31/2019 0321    Studies/Results: No results found.    Theodore [name] 09/11/2019  Patient ID: Theodore [name], male   DOB: May 17, 1956, 63 y.o.   MRN: [id number]

## 2019-09-11 NOTE — Progress Notes (Addendum)
Apparently still oozing from staple site, will switch lovenox to ppx dose.     NAME:  Theodore [name], MRN:  [id number], DOB:  August 29, 1956, LOS: 28 ADMISSION DATE:  08/30/2019, CONSULTATION DATE:  10/26 REFERRING MD: Alphonsa Overall, Berle Mull , CHIEF COMPLAINT:  Dyspnea, abdominal pain   Brief History   63 year old man with 2-3L O2 dependent COPD, CAD, PE who presented 10/21 with acute dyspnea thought secondary to AECOPD.  He developed abdominal pain & was found to have pneumoperitoneum on 10/26.  Subsequently was taken for exploratory laparotomy for possible pneumoperitoneum and ischemic small bowel. However, no underlying cause for pneumoperitoneum found. Remained intubated post-operatively, to ICU.  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  To OR with no source found. AF after surgery. 10/26 Ex-lap >> "he had no evidence of ischemia, perforation, or abnormal changes of his small bowel.  He did had a bolus of food with undigested food particles that bulged in the small bowel, and I suspect, that this is what gave the impression of pneumatosis in the small bowel."   10/27 Bronchospastic on vent, failed wean  10/28 Extubated 11/2 still SOB, required BIPAP from 11/1 to 11/2 am hours. KVO IVFs. Giving lasix, bumping up reflux rx for upper airway component  Consults:  Surgery   Procedures:  ETT 10/26 >> 10/28  Significant Diagnostic Tests:   ECHO 10/22 >> LVEF 65-70%, hyperdynamic function, no LVH, RV mildly enlarged, global RV function normal, LA normal, RA normal  ABD CXR 10/26 >> moderate  volume pneumoperitoneum concerning for hollow viscus perforation, dilated loops of small bowel concerning for SBO, RUL airspace opacity, large amt stool in right hemicolon  CT ABD 10/26 >> large volume of abdominopelvic pneumoperitoneum with dilated abnormal appearing small bowel in the left abdomen with fecalization and pneumatosis suspicious for ischemic small bowel, no significant free fluid, hemorrhage or abscess  Micro Data:  COVID 10/21 >> negative  MRSA PCR 10/26 >> negative   Antimicrobials:  Ceftriaxone x1 10/21  Azithromycin 10/21 >> 10/23  Doxycycline 1024 >> 10/26 Fluconazole 10/26 >> 10/27 Zosyn 10/26 >> 10/28  Interim history/subjective:  Doing well. Objective   Blood pressure (Abnormal) 130/112, pulse (Abnormal) 109, temperature 98 F (36.7 C), temperature source Oral, resp. rate 19, height 6\' 4"  (1.93 m), weight 103 kg, SpO2 97 %.    FiO2 (%):  [30 %-31 %] 30 %   Intake/Output Summary (Last 24 hours) at 09/11/2019 0809 Last data filed at 09/11/2019 0600 Gross per 24 hour  Intake 1330.04 ml  Output 1375 ml  Net -44.96 ml   Filed Weights   08/30/19 2331 09/04/19 2316  Weight: 104.3 kg 103 kg    Examination: General: Chronically ill-appearing male in bed he is not in acute distress but is exhibiting some degree of accessory muscle use to talk HEENT normal cephalic atraumatic nasogastric tube draining bilious fluid speech is rhonchorous and coarse, posterior pharynx erythemic, has marked upper airway wheezing Pulmonary: Respiratory efforts tight, diminished throughout, some accessory use. Cardiac: Regular rate and rhythm no rub or gallop appreciated Abdomen: Soft, not tender to palpation.  Dressings intact,  still quite quiet Extremities diffuse anasarca pitting edema lower extremities wrapped Neuro: Awake oriented no focal deficits equal strength bilaterally Psych: Appropriate, good spirits    Resolved Hospital Problem list     Assessment & Plan:   #  Idiopathic Pneumoperitoneum- ex lap unrevealing # Postop ileus- tolerating claming trial and passing gas but abd still quiet  # AECOPD-complicated further by ileus and decreased cAbd.  # Afib, hx of PE- still off AC # leukocytosis  # anemia of critical illness superimposed on macrocytic anemia  Discussion Had episode of bronchospasm yesterday afternoon.  Although he is over 7 L negative on exam he still appears volume overloaded.  It also seems as though upper airway wheezing and irritation is the predominant factor in his work of breathing, although he does seem quite tight as well.  I think focusing on upper airway for today, and maximizing volume status may go a long way in decreasing symptoms  Plan/rec Cont scheduled BDs and ICS Add flutter KVO IVFs  Lasix x 1 (would push this as long as BP/BUn/cr allow) No change in IV steroids for now (40 solumedrol) Agree w/ CXR Mobilize rx ileus  Increase PPI to BID and change reglan to q6 BiPAP as needed Resume full st [facility] as soon as surgery allows    We will continue to follow with you  Simonne Martinet ACNP-BC [facility] Pulmonary/Critical Care Pager # [phone] OR # [phone] if no answer

## 2019-09-11 NOTE — Progress Notes (Signed)
PROGRESS NOTE    Theodore [name]  ZOX:[id number] DOB: January 02, 1956 DOA: 08/30/2019 PCP: Theodore [name], Theodore W, MD    Brief Narrative:   Theodore [name] is a 63 y.o. male with medical history significant for steroid-dependent COPD, chronic hypoxic respiratory failure, coronary artery disease, chronic leg swelling, and history of PE on Xarelto, now presenting to the emergency department for evaluation of shortness of breath.  He is found to be hypoxic with labored respirations with chest x-ray notable for chronic hyperinflation, emphysema without acute findings.  Covid-19 was negative.  Patient was originally admitted for underlying COPD exacerbation and treatment with steroids, breathing treatments and antibiotics.  During the hospitalization, patient developed abdominal pain on 09/04/2019 with CT abdomen/pelvis findings of pneumoperitoneum, and he underwent exploratory laparotomy without any significant findings to suggest any intra-abdominal pathology to explain the CAT scan findings.  He was difficult to wean from the ventilator, and was maintained on ventilatory support for 48 hours prior to successful extubation on 09/06/2019.  He continues with significant debility postoperative ileus.  [facility]  Assessment & Plan:   Principal Problem:   COPD with acute exacerbation (HCC) Active Problems:   COPD GOLD IV    COPD exacerbation (HCC)   Chronic respiratory failure assoc with cor pulmonale   CAD (coronary artery disease)   DOE (dyspnea on exertion)   Chronic anticoagulation   Chronic respiratory failure with hypoxia (HCC)   Recurrent pulmonary emboli (HCC)   Hyponatremia   Acute on chronic respiratory failure with hypoxia (HCC)   Pneumoperitoneum   Current chronic use of systemic steroids   Pressure injury of skin   Acute on chronic hypoxic respiratory failure Acute COPD exacerbation Patient presenting to ED with progressive  shortness of breath associated with increased respiratory drive likely secondary to his underlying COPD.  Patient 2-3 L oxygen at baseline.  Home medications include Symbicort, Singulair, prednisone 10 mg p.o. daily.  Chest x-ray notable for hyperexpanded lungs without other acute findings.  Hospital course complicated by exploratory laparotomy in which she was difficult to extubate postoperatively, remained on ventilatory support for 48 hours before being extubated on 09/06/2019. --Supplemental oxygen currently weaned down to 2 L nasal cannula, goal SPO2 >88% --Nebs with Brovana and Pulmicort twice daily --Duo nebs every 6 hours scheduled --Solu-Medrol 40 mg IV daily; until can tolerate oral --Incentive spirometry hourly while awake and encourage increased mobilization  Pneumoperitoneum Postoperative ileus On 09/04/2019, patient developed abdominal pain.  Was noted to have pneumoperitoneum on CT abdomen/pelvis.  Emergently taken to the OR by general surgery and underwent exploratory laparotomy by Dr. Ezzard [name] without any findings to suggest etiology noted on CT scan. --General surgery following, appreciate assistance --KUB 10/31 with progressive SBO likely postoperative ileus versus obstruction --Continues with abdominal distention, reports flatus and one bowel movement overnight --General surgery clamping NG tube today and starting diet --If does not tolerate, will need NG tube advance per chest x-ray today with side-port at level of diaphragm  Acute blood loss anemia Drop 13.5 to 11.9 on 10/30; 12.4 today.  Blood oozing at surgical site dressing.  Currently holding anticoagulation. --Per surgery, avoid frequent dressing changes and provide reinforcement/ice packs --Continue to hold anticoagulation --Continue to monitor CBC daily  Paroxysmal atrial fibrillation New onset noted on 09/05/2019.  Likely exacerbated by acute illness.  Currently in normal sinus rhythm. On Xarelto outpatient for  history of PE, currently on hold secondary to ABLA/oozing at surgical site. --  Continue to monitor on telemetry --metoprolol IV PRN q3h to maintain HR <130  Essential hypertension On Maxide outpatient.  BP 158/78 this morning.  Continues to be n.p.o. secondary to his postoperative ileus; now NG tube back on LWS --Unable to use oral medications at this time --Labetalol  IV prn for SBP >170 or DBP >110 --hydralazine IV prn --Continue to monitor blood pressures closely  Bladder spasms: resolved Likely etiology from clogged Foley catheter, in which Foley was discontinued on 09/07/2019.  Patient reports no further issues.  Hx PE On Xarelto outpatient.  Currently holding anticoagulation due to acute blood loss anemia with oozing at surgical site. --Continue to monitor CBC daily   DVT prophylaxis: SCDs, holding Lovenox secondary to ABLA/oozing from surgical site Code Status: Full code Family Communication: None Disposition Plan: Continue inpatient, SDU level of care; further dependent on clinical course, therapy currently recommending SNF placement.   Consultants:   PCCM  General surgery  Procedures:   Exploratory laparotomy 10/26, Dr. Ezzard Standing  Intubation; extubated 09/06/2019  Foley catheter, removed 09/07/2019  Antimicrobials:   Zosyn 10/26 - 10/28  Diflucan 10/26 - 10/28   Subjective: Patient seen and examined at bedside, nursing present.  Overnight well and using the bedside commode, patient became more dyspneic and was subsequently placed on BiPAP overnight.  Repeat chest x-ray this morning with no acute cardiopulmonary findings except for his underlying emphysema.  General surgery clamping NG tube this morning with diet advancement.  Patient reports continued flatus with 1 bowel movement overnight.  Continues with some oozing at surgical site.  No other specific complaints or concerns at this time.  Denies headache, no fever/chills/night sweats, no  nausea/vomiting/diarrhea, no chest pain, no palpitations, no abdominal discomfort, no paresthesias.  No acute events overnight per nursing staff.  Objective: Vitals:   09/11/19 0624 09/11/19 0700 09/11/19 0810 09/11/19 0813  BP: (!) 130/112   130/74  Pulse: (!) 109  95 (!) 102  Resp: 19  20 (!) 23  Temp:  98 F (36.7 C)    TempSrc:  Oral    SpO2: 97%  96% 95%  Weight:      Height:        Intake/Output Summary (Last 24 hours) at 09/11/2019 1125 Last data filed at 09/11/2019 0813 Gross per 24 hour  Intake 1544.03 ml  Output 1150 ml  Net 394.03 ml   Filed Weights   08/30/19 2331 09/04/19 2316  Weight: 104.3 kg 103 kg    Examination:  General exam: Appears calm and comfortable; appears older than stated age Respiratory system: Coarse breath sounds bilaterally with slightly decreased bases with mild late expiratory wheezing, normal respiratory effort, on Venturi mask Cardiovascular system: S1 & S2 heard, RRR. No JVD, murmurs, rubs, gallops or clicks. No pedal edema. Gastrointestinal system: Abdomen is nondistended, soft and nontender. No organomegaly or masses felt. Normal bowel sounds heard.  Surgical site noted with dressing in place with slightly blood-tinged dressing Central nervous system: Alert and oriented. No focal neurological deficits. Extremities: Symmetric 5 x 5 power. Skin: No rashes, lesions or ulcers Psychiatry: Judgement and insight appear normal. Mood & affect appropriate.     Data Reviewed: I have personally reviewed following labs and imaging studies  CBC: Recent Labs  Lab 09/07/19 0513 09/08/19 0149 09/09/19 0217 09/10/19 0230 09/11/19 0259  WBC 21.4* 18.0* 18.3* 17.9* 15.1*  HGB 13.5 11.9* 13.2 12.4* 11.5*  HCT 42.8 39.5 43.1 40.9 38.3*  MCV 100.0 103.1* 102.4* 104.1* 106.4*  PLT 303  308 322 290 427   Basic Metabolic Panel: Recent Labs  Lab 09/07/19 0513 09/08/19 0149 09/09/19 0217 09/10/19 0230 09/11/19 0259  NA 139 142 140 143 145  K  4.2 3.9 4.5 4.2 4.1  CL 100 104 104 106 111  CO2 28 29 26 27 25   GLUCOSE 137* 99 110* 112* 119*  BUN 30* 30* 26* 23 19  CREATININE 1.09 0.97 0.79 0.73 0.68  CALCIUM 8.3* 8.3* 8.5* 8.1* 8.0*  MG 2.4 2.4 2.3 2.3 2.1   GFR: Estimated Creatinine Clearance: 113 mL/min (by C-G formula based on SCr of 0.68 mg/dL). Liver Function Tests: No results for input(s): AST, ALT, ALKPHOS, BILITOT, PROT, ALBUMIN in the last 168 hours. No results for input(s): LIPASE, AMYLASE in the last 168 hours. No results for input(s): AMMONIA in the last 168 hours. Coagulation Profile: No results for input(s): INR, PROTIME in the last 168 hours. Cardiac Enzymes: No results for input(s): CKTOTAL, CKMB, CKMBINDEX, TROPONINI in the last 168 hours. BNP (last 3 results) No results for input(s): PROBNP in the last 8760 hours. HbA1C: No results for input(s): HGBA1C in the last 72 hours. CBG: Recent Labs  Lab 09/07/19 0826 09/08/19 0740 09/09/19 0819 09/10/19 0842 09/11/19 0837  GLUCAP 103* 78 100* 107* 105*   Lipid Profile: No results for input(s): CHOL, HDL, LDLCALC, TRIG, CHOLHDL, LDLDIRECT in the last 72 hours. Thyroid Function Tests: No results for input(s): TSH, T4TOTAL, FREET4, T3FREE, THYROIDAB in the last 72 hours. Anemia Panel: No results for input(s): VITAMINB12, FOLATE, FERRITIN, TIBC, IRON, RETICCTPCT in the last 72 hours. Sepsis Labs: No results for input(s): PROCALCITON, LATICACIDVEN in the last 168 hours.  Recent Results (from the past 240 hour(s))  Surgical PCR screen     Status: None   Collection Time: 09/04/19  7:51 PM   Specimen: Nasal Mucosa; Nasal Swab  Result Value Ref Range Status   MRSA, PCR NEGATIVE NEGATIVE Final   Staphylococcus aureus NEGATIVE NEGATIVE Final    Comment: (NOTE) The Xpert SA Assay (FDA approved for NASAL specimens in patients 84 years of age and older), is one component of a comprehensive surveillance program. It is not intended to diagnose infection nor to  guide or monitor treatment. Performed at [facility], [place] [address], [place], [place] [address]   MRSA PCR Screening     Status: None   Collection Time: 09/04/19 10:29 PM   Specimen: Nasal Mucosa; Nasopharyngeal  Result Value Ref Range Status   MRSA by PCR NEGATIVE NEGATIVE Final    Comment:        The GeneXpert MRSA Assay (FDA approved for NASAL specimens only), is one component of a comprehensive MRSA colonization surveillance program. It is not intended to diagnose MRSA infection nor to guide or monitor treatment for MRSA infections. Performed at [facility], [place] [address], [place], [place] [address]          Radiology Studies: Dg Chest Port 1 View  Result Date: 09/11/2019 CLINICAL DATA:  Dyspnea EXAM: PORTABLE CHEST 1 VIEW COMPARISON:  09/06/2019 FINDINGS: The heart and mediastinum are normal. Pulmonary hyperinflation and emphysema without acute airspace opacity. Esophagogastric tube with tip below the diaphragm but side-port at the level of the gastroesophageal junction. IMPRESSION: 1. Pulmonary hyperinflation and emphysema without acute airspace opacity. 2. Esophagogastric tube with tip below the diaphragm but side-port at the level of the gastroesophageal junction. Recommend advancement to ensure subdiaphragmatic position. Electronically Signed   By: Dorna Bloom.D.  On: 09/11/2019 09:11        Scheduled Meds: . arformoterol  15 mcg Nebulization BID  . bisacodyl  10 mg Rectal Daily  . budesonide (PULMICORT) nebulizer solution  0.5 mg Nebulization BID  . chlorhexidine  15 mL Mouth Rinse BID  . Chlorhexidine Gluconate Cloth  6 each Topical Daily  . enoxaparin (LOVENOX) injection  100 mg Subcutaneous Q12H  . fluticasone  2 spray Each Nare Daily  . ipratropium-albuterol  3 mL Nebulization Q6H  . lip balm  1 application Topical BID  . mouth rinse  15 mL Mouth Rinse q12n4p  . methylPREDNISolone (SOLU-MEDROL) injection  40  mg Intravenous Daily  . metoCLOPramide (REGLAN) injection  5 mg Intravenous Q6H  . pantoprazole (PROTONIX) IV  40 mg Intravenous Q12H  . sodium chloride flush  3 mL Intravenous Q12H   Continuous Infusions: . sodium chloride 20 mL/hr at 09/07/19 0531  . dextrose 5 % and 0.9 % NaCl with KCl 20 mEq/L 10 mL/hr at 09/11/19 0836     LOS: 11 days    Time spent: 35 minutes spent on chart review, discussion with nursing staff, consultants, updating family and interview/physical exam; more than 50% of that time was spent in counseling and/or coordination of care.    Alvira Philips [place], DO [facility] 09/11/2019, 11:25 AM

## 2019-09-11 NOTE — Progress Notes (Signed)
Nutrition Follow-up  RD working remotely.  DOCUMENTATION CODES:   Not applicable  INTERVENTION:   - If unable to advance diet, recommend TPN as pt has been without adequate nutrition since 10/26 (7 days)  - RD will monitor for diet advancement and supplement as appropriate  NUTRITION DIAGNOSIS:   Inadequate oral intake related to inability to eat as evidenced by NPO status.  Ongoing  GOAL:   Patient will meet greater than or equal to 90% of their needs  Unmet at this time  MONITOR:   Diet advancement, Labs, Weight trends, I & O's  REASON FOR ASSESSMENT:   Ventilator    ASSESSMENT:   63 y.o. male with medical history significant for steroid-dependent COPD, chronic hypoxic respiratory failure, CAD, chronic leg swelling, and history of PE on Xarelto. He presented to the ED for SOB, increased exertional dyspnea for 2 days, increased productive cough and wheezing, and was unable to recover after becoming acutely dyspneic with mild exertion. CXR notable for chronic hyperinflation and emphysema without acute findings.  10/26 - s/p ex-lap for pneumoperitoneum 10/28 - extubated 10/29 - NGT clamped, sips of clears 10/30 - clear liquid diet 10/31 - NGT back to suction, NPO 11/02 - NGT clamping trial  Per Surgery note, may removed NGT later today if clamping is tolerated. Pt remains NPO at this time.  No new weights since 10/26.  Per RN edema assessment, pt with mild pitting edema to BUE and deep pitting edema to BLE.  Medications reviewed and include: Dulcolax, Solu-medrol, Reglan 5 mg q 6 hours, Protonix IVF: D5 and NS with KCl @ 10 ml/hr  Labs reviewed. CBG's: 105  UOP: 1325 ml x 24 hours I/O's: -6.8 L since admit  Diet Order:   Diet Order            Diet NPO time specified  Diet effective now              EDUCATION NEEDS:   No education needs have been identified at this time  Skin:  Skin Assessment: Skin Integrity Issues: Skin Integrity  Issues: Stage II: left buttocks and sacrum Incisions: abdomen (10/26)  Last BM:  09/10/19  Height:   Ht Readings from Last 1 Encounters:  09/04/19 6\' 4"  (1.93 m)    Weight:   Wt Readings from Last 1 Encounters:  09/04/19 103 kg    Ideal Body Weight:  91.8 kg  BMI:  Body mass index is 27.64 kg/m.  Estimated Nutritional Needs:   Kcal:  2400-2600  Protein:  125-145 grams  Fluid:  >/= 2.2 L    Gaynell Face, MS, RD, LDN Inpatient Clinical Dietitian Pager: [phone] Weekend/After Hours: [phone]

## 2019-09-12 LAB — BASIC METABOLIC PANEL
Anion gap: 9 (ref 5–15)
BUN: 23 mg/dL (ref 8–23)
CO2: 26 mmol/L (ref 22–32)
Calcium: 8.4 mg/dL — ABNORMAL LOW (ref 8.9–10.3)
Chloride: 109 mmol/L (ref 98–111)
Creatinine, Ser: 0.88 mg/dL (ref 0.61–1.24)
GFR calc Af Amer: >60 mL/min (ref >60)
GFR calc non Af Amer: >60 mL/min (ref >60)
Glucose, Bld: 90 mg/dL (ref 70–99)
Potassium: 3.9 mmol/L (ref 3.5–5.1)
Sodium: 144 mmol/L (ref 135–145)

## 2019-09-12 LAB — CBC
HCT: 38.1 % — ABNORMAL LOW (ref 39.0–52.0)
Hemoglobin: 11.5 g/dL — ABNORMAL LOW (ref 13.0–17.0)
MCH: 31.3 pg (ref 26.0–34.0)
MCHC: 30.2 g/dL (ref 30.0–36.0)
MCV: 103.8 fL — ABNORMAL HIGH (ref 80.0–100.0)
Platelets: 300 10*3/uL (ref 150–400)
RBC: 3.67 MIL/uL — ABNORMAL LOW (ref 4.22–5.81)
RDW: 13.9 % (ref 11.5–15.5)
WBC: 15.8 10*3/uL — ABNORMAL HIGH (ref 4.0–10.5)
nRBC: 0 % (ref 0.0–0.2)

## 2019-09-12 LAB — GLUCOSE, CAPILLARY: Glucose-Capillary: 81 mg/dL (ref 70–99)

## 2019-09-12 LAB — MAGNESIUM: Magnesium: 2.1 mg/dL (ref 1.7–2.4)

## 2019-09-12 MED ORDER — LABETALOL HCL 5 MG/ML IV SOLN
10.0000 mg | Freq: Three times a day (TID) | INTRAVENOUS | Status: DC | PRN
  Administered 2019-09-12: 10 mg via INTRAVENOUS
  Filled 2019-09-12 (×2): qty 4

## 2019-09-12 MED ORDER — METOCLOPRAMIDE HCL 5 MG PO TABS
5.0000 mg | ORAL_TABLET | Freq: Three times a day (TID) | ORAL | Status: DC
  Administered 2019-09-12 – 2019-09-18 (×25): 5 mg via ORAL
  Filled 2019-09-12 (×25): qty 1

## 2019-09-12 MED ORDER — BOOST / RESOURCE BREEZE PO LIQD CUSTOM
1.0000 | Freq: Three times a day (TID) | ORAL | Status: DC
  Administered 2019-09-12 (×3): 1 via ORAL

## 2019-09-12 MED ORDER — POTASSIUM CHLORIDE CRYS ER 20 MEQ PO TBCR
40.0000 meq | EXTENDED_RELEASE_TABLET | Freq: Once | ORAL | Status: AC
  Administered 2019-09-12: 40 meq via ORAL
  Filled 2019-09-12: qty 2

## 2019-09-12 MED ORDER — FUROSEMIDE 10 MG/ML IJ SOLN
40.0000 mg | Freq: Once | INTRAMUSCULAR | Status: AC
  Administered 2019-09-12: 40 mg via INTRAVENOUS
  Filled 2019-09-12: qty 4

## 2019-09-12 MED ORDER — PREDNISONE 5 MG PO TABS
30.0000 mg | ORAL_TABLET | Freq: Every day | ORAL | Status: DC
  Administered 2019-09-12 – 2019-09-18 (×7): 30 mg via ORAL
  Filled 2019-09-12 (×8): qty 1

## 2019-09-12 NOTE — Progress Notes (Signed)
Physical Therapy Treatment Patient Details Name: Theodore [name] MRN: [id number] DOB: 01/16/1956 Today's Date: 09/12/2019    History of Present Illness 63 y.o. male with medical history significant for steroid-dependent COPD, chronic hypoxic respiratory failure, coronary artery disease, chronic leg swelling, and history of PE on Xarelto, now presenting to the emergency department for evaluation of shortness of breath. Dx: COPD exacerbation. S/P ex lap 10/26 due to pneumoperitoneum. Extubated 10/28.    PT Comments    Pt OOB in recliner via nursing.  Assisted with amb.  General transfer comment: light assistance to rise and steady.  Assist for lines.  Increased time. 2 trials d/t fatigue with first standing attempt General Gait Details: + 2 for safety for recliner to follow.  Remained on 4 lts nasal avg sats 92%.  Starting HR 119.  Highest HR with activity 152.  Dyspnea 3/4.  Pt aware of purse lip breathing.  Required one seated rest break.   Pt progressing.  D/C plans pending outcome.    Follow Up Recommendations  SNF;Home health PT;Supervision/Assistance - 24 hour     Equipment Recommendations  None recommended by PT    Recommendations for Other Services       Precautions / Restrictions Precautions Precautions: Fall Precaution Comments: baseline O2 dependent at night only--2L; monitor O2/HR; abdominal sg Restrictions Weight Bearing Restrictions: No    Mobility  Bed Mobility               General bed mobility comments: OOB in chair  Transfers Overall transfer level: Needs assistance Equipment used: Rolling walker (2 wheeled) Transfers: Sit to/from Stand Sit to Stand: Supervision;Min guard         General transfer comment: light assistance to rise and steady.  Assist for lines.  Increased time. 2 trials d/t fatigue with first standing attempt   Ambulation/Gait Ambulation/Gait assistance: Min guard;+2 safety/equipment Gait Distance (Feet): 95 Feet(one seated rest  break) Assistive device: Rolling walker (2 wheeled) Gait Pattern/deviations: Step-through pattern;Decreased stride length Gait velocity: decreased   General Gait Details: + 2 for safety for recliner to follow.  Remained on 4 lts nasal avg sats 92%.  Starting HR 119.  Highest HR with activity 152.  Dyspnea 3/4.  Pt aware of purse lip breathing.  Required one seated rest break.   Stairs             Wheelchair Mobility    Modified Rankin (Stroke Patients Only)       Balance                                            Cognition Arousal/Alertness: Awake/alert Behavior During Therapy: WFL for tasks assessed/performed Overall Cognitive Status: Within Functional Limits for tasks assessed                                 General Comments: motivated, pleasant      Exercises      General Comments        Pertinent Vitals/Pain Pain Assessment: No/denies pain    Home Living                      Prior Function            PT Goals (current goals can now be found in the care plan  section) Progress towards PT goals: Progressing toward goals    Frequency    Min 3X/week      PT Plan Current plan remains appropriate    Co-evaluation              AM-PAC PT "6 Clicks" Mobility   Outcome Measure  Help needed turning from your back to your side while in a flat bed without using bedrails?: A Lot Help needed moving from lying on your back to sitting on the side of a flat bed without using bedrails?: A Lot Help needed moving to and from a bed to a chair (including a wheelchair)?: A Lot Help needed standing up from a chair using your arms (e.g., wheelchair or bedside chair)?: A Lot Help needed to walk in hospital room?: A Lot Help needed climbing 3-5 steps with a railing? : Total 6 Click Score: 11    End of Session Equipment Utilized During Treatment: Gait belt Activity Tolerance: Patient limited by fatigue Patient left:  in chair;with call bell/phone within reach;with family/visitor present Nurse Communication: Mobility status PT Visit Diagnosis: Difficulty in walking, not elsewhere classified (R26.2);Muscle weakness (generalized) (M62.81)     Time: 1050-1130 PT Time Calculation (min) (ACUTE ONLY): 40 min  Charges:  $Gait Training: 23-37 mins $Therapeutic Activity: 8-22 mins                     Felecia Shelling  PTA Acute  Rehabilitation Services Pager      [phone] Office      [phone]

## 2019-09-12 NOTE — Progress Notes (Signed)
BiPAP V-60 removed from room/available if needed.

## 2019-09-12 NOTE — Progress Notes (Addendum)
Apparently still oozing from staple site, will switch lovenox to ppx dose.     NAME:  Theodore [name], MRN:  [id number], DOB:  [birth year], LOS: 32 ADMISSION DATE:  08/30/2019, CONSULTATION DATE:  10/26 REFERRING MD: Alphonsa Overall, Berle Mull , CHIEF COMPLAINT:  Dyspnea, abdominal pain   Brief History   63 year old man with 2-3L O2 dependent COPD, CAD, PE who presented 10/21 with acute dyspnea thought secondary to AECOPD.  He developed abdominal pain & was found to have pneumoperitoneum on 10/26.  Subsequently was taken for exploratory laparotomy for possible pneumoperitoneum and ischemic small bowel. However, no underlying cause for pneumoperitoneum found. Remained intubated post-operatively, to ICU.  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  To OR with no source found. AF after surgery. 10/26 Ex-lap >> "he had no evidence of ischemia, perforation, or abnormal changes of his small bowel.  He did had a bolus of food with undigested food particles that bulged in the small bowel, and I suspect, that this is what gave the impression of pneumatosis in the small bowel."   10/27 Bronchospastic on vent, failed wean  10/28 Extubated 11/2 still SOB, required BIPAP from 11/1 to 11/2 am hours. KVO IVFs. Giving lasix, bumping up reflux rx for upper airway component  Consults:  Surgery   Procedures:  ETT 10/26 >> 10/28  Significant Diagnostic Tests:   ECHO 10/22 >> LVEF 65-70%, hyperdynamic function, no LVH, RV mildly enlarged, global RV function normal, LA normal, RA normal  ABD CXR 10/26 >> moderate  volume pneumoperitoneum concerning for hollow viscus perforation, dilated loops of small bowel concerning for SBO, RUL airspace opacity, large amt stool in right hemicolon  CT ABD 10/26 >> large volume of abdominopelvic pneumoperitoneum with dilated abnormal appearing small bowel in the left abdomen with fecalization and pneumatosis suspicious for ischemic small bowel, no significant free fluid, hemorrhage or abscess  Micro Data:  COVID 10/21 >> negative  MRSA PCR 10/26 >> negative   Antimicrobials:  Ceftriaxone x1 10/21  Azithromycin 10/21 >> 10/23  Doxycycline 1024 >> 10/26 Fluconazole 10/26 >> 10/27 Zosyn 10/26 >> 10/28  Interim history/subjective:  Feeling better up in chair Objective   Blood pressure (Abnormal) 149/92, pulse (Abnormal) 105, temperature (Abnormal) 97.4 F (36.3 C), temperature source Oral, resp. rate (Abnormal) 22, height 6\' 4"  (1.93 m), weight 103 kg, SpO2 97 %.    FiO2 (%):  [31 %] 31 %   Intake/Output Summary (Last 24 hours) at 09/12/2019 0820 Last data filed at 09/12/2019 0600 Gross per 24 hour  Intake 232.22 ml  Output 1950 ml  Net -1717.78 ml   Filed Weights   08/30/19 2331 09/04/19 2316  Weight: 104.3 kg 103 kg    Examination: General: He sitting up in the chair and currently in no acute distress his work of breathing is remarkably better HEENT normocephalic atraumatic nasogastric tubes been removed still has upper airway wheezing but this is improved his mucous membranes are moist Pulmonary: Prolonged expiratory phase no accessory use Cardiac: Regular rate no murmur rub or gallop Abdomen: Dressing intact Neuro: Awake oriented no focal deficits Extremities: Lower extremities are  wrapped.  Has ongoing lower extremity edema, pulses are palpable GU: Voids Psych: Appropriate, affect appropriate.  No distress.    Resolved Hospital Problem list     Assessment & Plan:   # Idiopathic Pneumoperitoneum- ex lap unrevealing # Postop ileus-  tolerating claming trial and passing gas but abd still quiet  # AECOPD-complicated further by ileus and decreased cAbd.  # Afib, hx of PE- still off AC # leukocytosis  # anemia of critical illness superimposed on macrocytic anemia  Discussion He looks like a new man today.  He still has predominantly upper airway wheeze but this is improved in addition to his overall work of breathing.  His NG tube has been removed.  He is up in the chair.  He reports his cough has improved as well.  Overall I am pleased with his progress, I think he still needs another 24 or 48 hours before we see his upper airway irritation resolve, but I am encouraged by his progress overnight  Plan/rec Continue scheduled bronchodilators and inhaled corticosteroids Continue flutter valve  We will repeat Lasix again today, I would push this as long as his BUN and creatinine will tolerate it  Okay to change Solu-Medrol to prednisone 30 daily once taking p.o.'s Continue PPI at twice daily and Reglan every 6, I do not think the Reglan needs to stay indefinitely but we should continue this at least until upper airway wheezing resolved Mobilize BiPAP as needed Holding LMWH w/ surgical site bleeding As needed Tussionex for cough    We will see again tomorrow to ensure he is continuing to progress I suspect will sign off after that   Simonne Martinet ACNP-BC [facility] Pulmonary/Critical Care Pager # [phone] OR # [phone] if no answer

## 2019-09-12 NOTE — Progress Notes (Signed)
Nutrition Follow-up  DOCUMENTATION CODES:   Not applicable  INTERVENTION:   - Boost Breeze po TID, each supplement provides 250 kcal and 9 grams of protein  - Once diet advanced further, Ensure Enlive po BID, each supplement provides 350 kcal and 20 grams of protein  NUTRITION DIAGNOSIS:   Inadequate oral intake related to inability to eat as evidenced by NPO status.  Progressing, pt now on clear liquid diet  GOAL:   Patient will meet greater than or equal to 90% of their needs  Progressing  MONITOR:   Diet advancement, Labs, Weight trends, I & O's  REASON FOR ASSESSMENT:   Ventilator    ASSESSMENT:   63 y.o. male with medical history significant for steroid-dependent COPD, chronic hypoxic respiratory failure, CAD, chronic leg swelling, and history of PE on Xarelto. He presented to the ED for SOB, increased exertional dyspnea for 2 days, increased productive cough and wheezing, and was unable to recover after becoming acutely dyspneic with mild exertion. CXR notable for chronic hyperinflation and emphysema without acute findings.  10/26 - s/p ex-lap for pneumoperitoneum 10/28 - extubated 10/29 - NGT clamped, sips of clears 10/30 - clear liquid diet 10/31 - NGT back to suction, NPO 11/02 - NGT clamping trial, NGT removed 11/03 - clear liquid diet  No new weight since 10/26.  Diet advanced to clear liquids this AM. RD will order clear liquid oral nutrition supplement to aid pt in meeting kcal and protein needs.  Unable to speak with pt at this time. Other providers in room at time of RD's attempted visits.   Medications reviewed and include: Dulcolax, Reglan 5 mg TID, Protonix, Prednisone IVF: D5 and NS with KCl @ 10 ml/hr  Labs reviewed. CBG's: 81  UOP: 1900 ml x 24 hours I/O's: -8.5 L since admit  Diet Order:   Diet Order            Diet clear liquid Room service appropriate? Yes; Fluid consistency: Thin  Diet effective now              EDUCATION  NEEDS:   No education needs have been identified at this time  Skin:  Skin Assessment: Skin Integrity Issues: Stage II: left buttocks and sacrum Incisions: abdomen (10/26)  Last BM:  09/11/19  Height:   Ht Readings from Last 1 Encounters:  09/04/19 6\' 4"  (1.93 m)    Weight:   Wt Readings from Last 1 Encounters:  09/04/19 103 kg    Ideal Body Weight:  91.8 kg  BMI:  Body mass index is 27.64 kg/m.  Estimated Nutritional Needs:   Kcal:  2400-2600  Protein:  125-145 grams  Fluid:  >/= 2.2 L    Gaynell Face, MS, RD, LDN Inpatient Clinical Dietitian Pager: [phone] Weekend/After Hours: [phone]

## 2019-09-12 NOTE — Progress Notes (Signed)
PROGRESS NOTE    Theodore [name]  ZOX:[id number] DOB: 1956-02-06 DOA: 08/30/2019 PCP: [name], MD    Brief Narrative:   Theodore [name] is a 63 y.o. male with medical history significant for steroid-dependent COPD, chronic hypoxic respiratory failure, coronary artery disease, chronic leg swelling, and history of PE on Xarelto, now presenting to the emergency department for evaluation of shortness of breath.  He is found to be hypoxic with labored respirations with chest x-ray notable for chronic hyperinflation, emphysema without acute findings.  Covid-19 was negative.  Patient was originally admitted for underlying COPD exacerbation and treatment with steroids, breathing treatments and antibiotics.  During the hospitalization, patient developed abdominal pain on 09/04/2019 with CT abdomen/pelvis findings of pneumoperitoneum, and he underwent exploratory laparotomy without any significant findings to suggest any intra-abdominal pathology to explain the CAT scan findings.  He was difficult to wean from the ventilator, and was maintained on ventilatory support for 48 hours prior to successful extubation on 09/06/2019.  He continues with significant debility postoperative ileus.  Patient was transferred back to [facility] service from [facility] on 09/08/2019.  Assessment & Plan:   Principal Problem:   COPD with acute exacerbation (HCC) Active Problems:   COPD GOLD IV    COPD exacerbation (HCC)   Chronic respiratory failure assoc with cor pulmonale   CAD (coronary artery disease)   DOE (dyspnea on exertion)   Chronic anticoagulation   Chronic respiratory failure with hypoxia (HCC)   Recurrent pulmonary emboli (HCC)   Hyponatremia   Acute on chronic respiratory failure with hypoxia (HCC)   Pneumoperitoneum   Current chronic use of systemic steroids   Pressure injury of skin   Acute on chronic hypoxic respiratory failure Acute COPD exacerbation Patient presenting to ED with progressive  shortness of breath associated with increased respiratory drive likely secondary to his underlying COPD.  Patient 2-3 L oxygen at baseline.  Home medications include Symbicort, Singulair, prednisone 10 mg p.o. daily.  Chest x-ray notable for hyperexpanded lungs without other acute findings.  Hospital course complicated by exploratory laparotomy in which she was difficult to extubate postoperatively, remained on ventilatory support for 48 hours before being extubated on 09/06/2019. --Supplemental oxygen currently weaned down to 4 L on venturi mask, goal SPO2 >88% (baseline 2-3) --Nebs with Brovana and Pulmicort twice daily --Duo nebs every 6 hours scheduled --Solu-Medrol transitioned to oral prednisone 30 mg PO daily 11/3 by PCCM --received Lasix 40mg  IV today per PCCM --Incentive spirometry hourly while awake and encourage increased mobilization  Pneumoperitoneum Postoperative ileus On 09/04/2019, patient developed abdominal pain.  Was noted to have pneumoperitoneum on CT abdomen/pelvis.  Emergently taken to the OR by general surgery and underwent exploratory laparotomy by Dr. 09/06/2019 without any findings to suggest etiology noted on CT scan. --General surgery following, appreciate assistance --KUB 10/31 with progressive SBO likely postoperative ileus versus obstruction --NGT removed 11/2, BM this am with continued flatus --General surgery advancing diet to clear liquids today --Continue serial abdominal exams, close monitoring of bowel function  Acute blood loss anemia Drop 13.5 to 11.9 on 10/30; 11.5 today.  Blood oozing at surgical site dressing.  Currently holding anticoagulation. --Per surgery, avoid frequent dressing changes and provide reinforcement/ice packs --Continue to hold anticoagulation --Continue to monitor CBC daily  Paroxysmal atrial fibrillation New onset noted on 09/05/2019.  Likely exacerbated by acute illness.  Currently in normal sinus rhythm. On Xarelto outpatient for  history of PE, currently on hold secondary to ABLA/oozing at surgical site. --  Continue to monitor on telemetry --Labetalol IV prn  Essential hypertension On Maxide outpatient.  BP 158/78 this morning.  --Diet now transition to clear liquids, will start amlodipine 5 mg p.o. daily --Labetalol  IV prn for SBP >170 or DBP >110 --hydralazine IV prn --Continue to monitor blood pressures closely  Bladder spasms: resolved Likely etiology from clogged Foley catheter, in which Foley was discontinued on 09/07/2019.  Patient reports no further issues.  Hx PE On Xarelto outpatient.  Currently holding anticoagulation due to acute blood loss anemia with oozing at surgical site. --Continue to monitor CBC daily   DVT prophylaxis: SCDs, holding Lovenox secondary to ABLA/oozing from surgical site Code Status: Full code Family Communication: None Disposition Plan: Continue inpatient, SDU level of care; further dependent on clinical course, therapy currently recommending SNF placement.   Consultants:   PCCM  General surgery  Procedures:   Exploratory laparotomy 10/26, Dr. Ezzard Standing  Intubation; extubated 09/06/2019  Foley catheter, removed 09/07/2019  NG tube removed 09/11/2019  Antimicrobials:   Zosyn 10/26 - 10/28  Diflucan 10/26 - 10/28   Subjective: Patient seen and examined at bedside, nursing/respiratory present; receiving neb treatment.  Abdomen less distended.  Reports bowel movement early this morning.  Breathing much improved today.  NG tube removed yesterday.  General surgery advancing diet to clear liquids today.  Continues with some oozing at surgical site. No other specific complaints or concerns at this time.  Denies headache, no fever/chills/night sweats, no nausea/vomiting/diarrhea, no chest pain, no palpitations, no abdominal discomfort, no paresthesias.  No acute events overnight per nursing staff.  Objective: Vitals:   09/12/19 0800 09/12/19 0802 09/12/19 1000  09/12/19 1200  BP: (!) 149/92  137/72 (!) 150/70  Pulse: (!) 105  (!) 132 (!) 108  Resp: (!) Temp: (!) 97.4 F (36.3 C)   98.1 F (36.7 C)  TempSrc: Oral   Oral  SpO2: 97% 97% 100% 95%  Weight:      Height:        Intake/Output Summary (Last 24 hours) at 09/12/2019 1403 Last data filed at 09/12/2019 1000 Gross per 24 hour  Intake 202.85 ml  Output 1850 ml  Net -1647.15 ml   Filed Weights   08/30/19 2331 09/04/19 2316  Weight: 104.3 kg 103 kg    Examination:  General exam: Appears calm and comfortable; appears older than stated age Respiratory system: Mild late expiratory wheezing bilateral upper lobes, normal respiratory effort, on 4 L Venturi mask (baseline 2-3L) Cardiovascular system: S1 & S2 heard, RRR. No JVD, murmurs, rubs, gallops or clicks. No pedal edema. Gastrointestinal system: Abdomen is mildly distended, soft and nontender. No organomegaly or masses felt. bowel sounds heard, slightly hypoactive.  Surgical site noted with dressing in place with slightly blood-tinged dressing Central nervous system: Alert and oriented. No focal neurological deficits. Extremities: Symmetric 5 x 5 power. Skin: No rashes, lesions or ulcers Psychiatry: Judgement and insight appear normal. Mood & affect appropriate.     Data Reviewed: I have personally reviewed following labs and imaging studies  CBC: Recent Labs  Lab 09/08/19 0149 09/09/19 0217 09/10/19 0230 09/11/19 0259 09/12/19 0212  WBC 18.0* 18.3* 17.9* 15.1* 15.8*  HGB 11.9* 13.2 12.4* 11.5* 11.5*  HCT 39.5 43.1 40.9 38.3* 38.1*  MCV 103.1* 102.4* 104.1* 106.4* 103.8*  PLT 308 322 290 280 300   Basic Metabolic Panel: Recent Labs  Lab 09/08/19 0149 09/09/19 0217 09/10/19 0230 09/11/19 0259 09/12/19 0212  NA 142 140 143  145 144  K 3.9 4.5 4.2 4.1 3.9  CL 104 104 106 111 109  CO2 29 26 27 25 26   GLUCOSE 99 110* 112* 119* 90  BUN 30* 26* 23 19 23   CREATININE 0.97 0.79 0.73 0.68 0.88  CALCIUM 8.3*  8.5* 8.1* 8.0* 8.4*  MG 2.4 2.3 2.3 2.1 2.1   GFR: Estimated Creatinine Clearance: 102.7 mL/min (by C-G formula based on SCr of 0.88 mg/dL). Liver Function Tests: No results for input(s): AST, ALT, ALKPHOS, BILITOT, PROT, ALBUMIN in the last 168 hours. No results for input(s): LIPASE, AMYLASE in the last 168 hours. No results for input(s): AMMONIA in the last 168 hours. Coagulation Profile: No results for input(s): INR, PROTIME in the last 168 hours. Cardiac Enzymes: No results for input(s): CKTOTAL, CKMB, CKMBINDEX, TROPONINI in the last 168 hours. BNP (last 3 results) No results for input(s): PROBNP in the last 8760 hours. HbA1C: No results for input(s): HGBA1C in the last 72 hours. CBG: Recent Labs  Lab 09/08/19 0740 09/09/19 0819 09/10/19 0842 09/11/19 0837 09/12/19 0734  GLUCAP 78 100* 107* 105* 81   Lipid Profile: No results for input(s): CHOL, HDL, LDLCALC, TRIG, CHOLHDL, LDLDIRECT in the last 72 hours. Thyroid Function Tests: No results for input(s): TSH, T4TOTAL, FREET4, T3FREE, THYROIDAB in the last 72 hours. Anemia Panel: No results for input(s): VITAMINB12, FOLATE, FERRITIN, TIBC, IRON, RETICCTPCT in the last 72 hours. Sepsis Labs: No results for input(s): PROCALCITON, LATICACIDVEN in the last 168 hours.  Recent Results (from the past 240 hour(s))  Surgical PCR screen     Status: None   Collection Time: 09/04/19  7:51 PM   Specimen: Nasal Mucosa; Nasal Swab  Result Value Ref Range Status   MRSA, PCR NEGATIVE NEGATIVE Final   Staphylococcus aureus NEGATIVE NEGATIVE Final    Comment: (NOTE) The Xpert SA Assay (FDA approved for NASAL specimens in patients 65 years of age and older), is one component of a comprehensive surveillance program. It is not intended to diagnose infection nor to guide or monitor treatment. Performed at [facility], [address] [place], [place] [address]   MRSA PCR Screening     Status: None   Collection  Time: 09/04/19 10:29 PM   Specimen: Nasal Mucosa; Nasopharyngeal  Result Value Ref Range Status   MRSA by PCR NEGATIVE NEGATIVE Final    Comment:        The GeneXpert MRSA Assay (FDA approved for NASAL specimens only), is one component of a comprehensive MRSA colonization surveillance program. It is not intended to diagnose MRSA infection nor to guide or monitor treatment for MRSA infections. Performed at [facility], [address] [place], [place] [address]          Radiology Studies: Dg Chest Port 1 View  Result Date: 09/11/2019 CLINICAL DATA:  Dyspnea EXAM: PORTABLE CHEST 1 VIEW COMPARISON:  09/06/2019 FINDINGS: The heart and mediastinum are normal. Pulmonary hyperinflation and emphysema without acute airspace opacity. Esophagogastric tube with tip below the diaphragm but side-port at the level of the gastroesophageal junction. IMPRESSION: 1. Pulmonary hyperinflation and emphysema without acute airspace opacity. 2. Esophagogastric tube with tip below the diaphragm but side-port at the level of the gastroesophageal junction. Recommend advancement to ensure subdiaphragmatic position. Electronically Signed   By: Lauralyn PrimesAlex  Bibbey M.D.   On: 09/11/2019 09:11        Scheduled Meds: . arformoterol  15 mcg Nebulization BID  . bisacodyl  10 mg Rectal Daily  .  budesonide (PULMICORT) nebulizer solution  0.5 mg Nebulization BID  . chlorhexidine  15 mL Mouth Rinse BID  . Chlorhexidine Gluconate Cloth  6 each Topical Daily  . feeding supplement  1 Container Oral TID BM  . fluticasone  2 spray Each Nare Daily  . ipratropium-albuterol  3 mL Nebulization Q6H  . lip balm  1 application Topical BID  . mouth rinse  15 mL Mouth Rinse q12n4p  . metoCLOPramide  5 mg Oral TID AC & HS  . pantoprazole (PROTONIX) IV  40 mg Intravenous Q12H  . predniSONE  30 mg Oral Q breakfast  . sodium chloride flush  3 mL Intravenous Q12H   Continuous Infusions: . sodium chloride 20 mL/hr  at 09/07/19 0531  . dextrose 5 % and 0.9 % NaCl with KCl 20 mEq/L 10 mL/hr at 09/12/19 0800     LOS: 12 days    Time spent: 34 minutes spent on chart review, discussion with nursing staff, consultants, updating family and interview/physical exam; more than 50% of that time was spent in counseling and/or coordination of care.    Harjas Biggins J [place] DO [facility] 09/12/2019, 2:03 PM

## 2019-09-12 NOTE — Progress Notes (Signed)
Assessment & Plan: Pneumoperitoneum POD#7 - status postEXPLORATORY LAPAROTOMY- 10/26 Dr. Lucia Gaskins -No evidence of any intra-abdominal pathology to explain pneumoperitoneum -BM this AM - NG out yesterday, well tolerated - begin clear liquid diet this AM - OOB, mobilize, PT/OT - up in chair this AM - dry dressing to abd wound - some oozing from lower wound overnight - holding Lovenox - WBC stable at 15K - continue IV zosyn, may be from steroids  GERD CAD, hx of NSTEMI Hx ofPE on Xarelto Acute on Chronic Hypoxemic Respiratory Failure/Acute Exacerbation of COPD-extubated 10/28,per CCM Atrial fibrillation- currently in NSR ABL anemia - Hgb 11.9 from 13.5  ID -zosyn 10/26>>10/28, diflucan 10/26>>10/27 FEN -IVF,CLD VTE -SCDs,holdlovenox Foley -out        [name], MD       [facility]       Office: [phone]   Chief Complaint: Pneumoperitoneum of unknown etiology  Subjective: Patient up in chair, more interactive this AM.  Small BM this AM, denies nausea.  Objective: Vital signs in last 24 hours: Temp:  [97.4 F (36.3 C)-98.1 F (36.7 C)] 97.4 F (36.3 C) (11/03 0800) Pulse Rate:  [95-118] 105 (11/03 0800) Resp:  [14-22] 22 (11/03 0800) BP: (122-155)/(62-97) 149/92 (11/03 0800) SpO2:  [84 %-99 %] 97 % (11/03 0802) FiO2 (%):  [31 %] 31 % (11/03 0802) Last BM Date: 09/10/19  Intake/Output from previous day: 11/02 0701 - 11/03 0700 In: 446.2 [I.V.:446.2] Out: 1950 [Urine:1900; Emesis/NG output:50] Intake/Output this shift: Total I/O In: 20 [I.V.:20] Out: -   Physical Exam: HEENT - sclerae clear, mucous membranes moist Neck - soft Chest - shallow Cor - RRR Abdomen - moderate distension; dressing dry and intact (changed this AM); BS present  Lab Results:  Recent Labs    09/11/19 0259 09/12/19 0212  WBC 15.1* 15.8*  HGB 11.5* 11.5*  HCT 38.3* 38.1*  PLT 280 300   BMET Recent Labs    09/11/19 0259 09/12/19  0212  NA 145 144  K 4.1 3.9  CL 111 109  CO2 25 26  GLUCOSE 119* 90  BUN 19 23  CREATININE 0.68 0.88  CALCIUM 8.0* 8.4*   PT/INR No results for input(s): LABPROT, INR in the last 72 hours. Comprehensive Metabolic Panel:    Component Value Date/Time   NA 144 09/12/2019 0212   NA 145 09/11/2019 0259   NA 140 08/07/2017   NA 136 (A) 07/28/2017   K 3.9 09/12/2019 0212   K 4.1 09/11/2019 0259   CL 109 09/12/2019 0212   CL 111 09/11/2019 0259   CO2 26 09/12/2019 0212   CO2 25 09/11/2019 0259   BUN 23 09/12/2019 0212   BUN 19 09/11/2019 0259   BUN 12 08/07/2017   BUN 10 07/28/2017   CREATININE 0.88 09/12/2019 0212   CREATININE 0.68 09/11/2019 0259   GLUCOSE 90 09/12/2019 0212   GLUCOSE 119 (H) 09/11/2019 0259   CALCIUM 8.4 (L) 09/12/2019 0212   CALCIUM 8.0 (L) 09/11/2019 0259   AST 21 09/01/2019 0309   AST 19 08/31/2019 0321   ALT 15 09/01/2019 0309   ALT 17 08/31/2019 0321   ALKPHOS 47 09/01/2019 0309   ALKPHOS 54 08/31/2019 0321   BILITOT 0.8 09/01/2019 0309   BILITOT 0.6 08/31/2019 0321   PROT 6.2 (L) 09/01/2019 0309   PROT 6.7 08/31/2019 0321   ALBUMIN 3.3 (L) 09/01/2019 0309   ALBUMIN 3.7 08/31/2019 0321    Studies/Results: Dg Chest Port 1  View  Result Date: 09/11/2019 CLINICAL DATA:  Dyspnea EXAM: PORTABLE CHEST 1 VIEW COMPARISON:  09/06/2019 FINDINGS: The heart and mediastinum are normal. Pulmonary hyperinflation and emphysema without acute airspace opacity. Esophagogastric tube with tip below the diaphragm but side-port at the level of the gastroesophageal junction. IMPRESSION: 1. Pulmonary hyperinflation and emphysema without acute airspace opacity. 2. Esophagogastric tube with tip below the diaphragm but side-port at the level of the gastroesophageal junction. Recommend advancement to ensure subdiaphragmatic position. Electronically Signed   By: Lauralyn Primes M.D.   On: 09/11/2019 09:11      Darnell Level 09/12/2019  Patient ID: [name], male    DOB: 04/28/1956, 63 y.o.   MRN: [id number]

## 2019-09-12 NOTE — Progress Notes (Signed)
ANTICOAGULATION CONSULT NOTE   Pharmacy Consult for lovenox Indication: hx pulmonary embolus; new onset afib  Allergies  Allergen Reactions  . Naproxen Sodium Itching    Patient Measurements: Height: 6\' 4"  (193 cm) Weight: 227 lb 1.2 oz (103 kg) IBW/kg (Calculated) : 86.8 Heparin Dosing Weight:   Vital Signs: Temp: 97.4 F (36.3 C) (11/03 0800) Temp Source: Oral (11/03 0800) BP: 149/92 (11/03 0800) Pulse Rate: 105 (11/03 0800)  Labs: Recent Labs    09/10/19 0230 09/11/19 0259 09/12/19 0212  HGB 12.4* 11.5* 11.5*  HCT 40.9 38.3* 38.1*  PLT 290 280 300  CREATININE 0.73 0.68 0.88    Estimated Creatinine Clearance: 102.7 mL/min (by C-G formula based on SCr of 0.88 mg/dL).   Medications:  - on xarelto 20 mg daily PTA  Assessment: Patient's a 63 y.o M with hx PE on xarelto PTA presented to the ED on 10/21 with SOB.  She was found to have pneumoperitoneum and underwent expl lap on 10/26.  He remained intubated after the procedure was transferred to the ICU.  He was found to have new onset afib on 10/27. Pharmacy was consulted to transition xarelto to lovenox on 10/27.  Significant Events: - 10/29 PM: abd wound oozing blood; LMWH dose held starting with PM dose - 10/30: blood oozing at incision site improves--> per CCS, cont to hold [facility] for now - 10/31: abd wound still oozing blood. Per CCS, cont to hold [facility] - 11/1: per CCM, resumed LMWH but at VTE px dose 40 mg daily; some bleeding still noted with dressing change - 11/2: RN said blood still noted at incision site but bleeding is a lot less than before. Spoke to Dr. Harlow Asa, he said ok to resume FULL dose LMWH back on 11/2.  However, doses were never given d/t increased bleeding from site   Today, 09/12/2019: - cbc somewhat stable - RN reported bleeding at incision site has been on and off with more yesterday than today - crcl>30  Goal of Therapy:  Anti-Xa level 0.6-1 units/ml 4hrs after LMWH dose given Monitor  platelets by anticoagulation protocol: Yes   Plan:  - hold LMWH for now per Dr. Gala Lewandowsky recommendation - monitor for severity of bleeding at incision site - Please advise if/when anticoag. can be resumed back for patient. Pharmacy will follow patient peripherally along with you  Amarrah Meinhart P 09/12/2019,9:19 AM

## 2019-09-13 DIAGNOSIS — R0600 Dyspnea, unspecified: Secondary | ICD-10-CM

## 2019-09-13 DIAGNOSIS — Z7952 Long term (current) use of systemic steroids: Secondary | ICD-10-CM

## 2019-09-13 LAB — BASIC METABOLIC PANEL
Anion gap: 7 (ref 5–15)
BUN: 25 mg/dL — ABNORMAL HIGH (ref 8–23)
CO2: 28 mmol/L (ref 22–32)
Calcium: 8.2 mg/dL — ABNORMAL LOW (ref 8.9–10.3)
Chloride: 104 mmol/L (ref 98–111)
Creatinine, Ser: 0.81 mg/dL (ref 0.61–1.24)
GFR calc Af Amer: >60 mL/min (ref >60)
GFR calc non Af Amer: >60 mL/min (ref >60)
Glucose, Bld: 93 mg/dL (ref 70–99)
Potassium: 3.9 mmol/L (ref 3.5–5.1)
Sodium: 139 mmol/L (ref 135–145)

## 2019-09-13 LAB — GLUCOSE, CAPILLARY
Glucose-Capillary: 66 mg/dL — ABNORMAL LOW (ref 70–99)
Glucose-Capillary: 93 mg/dL (ref 70–99)

## 2019-09-13 LAB — CBC
HCT: 35.7 % — ABNORMAL LOW (ref 39.0–52.0)
Hemoglobin: 11.1 g/dL — ABNORMAL LOW (ref 13.0–17.0)
MCH: 31.5 pg (ref 26.0–34.0)
MCHC: 31.1 g/dL (ref 30.0–36.0)
MCV: 101.4 fL — ABNORMAL HIGH (ref 80.0–100.0)
Platelets: 260 10*3/uL (ref 150–400)
RBC: 3.52 MIL/uL — ABNORMAL LOW (ref 4.22–5.81)
RDW: 13.9 % (ref 11.5–15.5)
WBC: 14.5 10*3/uL — ABNORMAL HIGH (ref 4.0–10.5)
nRBC: 0 % (ref 0.0–0.2)

## 2019-09-13 LAB — MAGNESIUM: Magnesium: 2.2 mg/dL (ref 1.7–2.4)

## 2019-09-13 MED ORDER — ENSURE ENLIVE PO LIQD
237.0000 mL | Freq: Two times a day (BID) | ORAL | Status: DC
  Administered 2019-09-13 – 2019-09-18 (×10): 237 mL via ORAL

## 2019-09-13 MED ORDER — FUROSEMIDE 10 MG/ML IJ SOLN
40.0000 mg | Freq: Two times a day (BID) | INTRAMUSCULAR | Status: DC
  Administered 2019-09-13 – 2019-09-18 (×11): 40 mg via INTRAVENOUS
  Filled 2019-09-13 (×11): qty 4

## 2019-09-13 NOTE — Progress Notes (Signed)
Occupational Therapy Treatment Patient Details Name: Theodore [name] MRN: [id number] DOB: Mar 16, 1956 Today's Date: 09/13/2019    History of present illness 63 y.o. male with medical history significant for steroid-dependent COPD, chronic hypoxic respiratory failure, coronary artery disease, chronic leg swelling, and history of PE on Xarelto, now presenting to the emergency department for evaluation of shortness of breath. Dx: COPD exacerbation. S/P ex lap 10/26 due to pneumoperitoneum. Extubated 10/28.   OT comments  Pt progressing with OT.  He would benefit from SNF if he doesn't have enough assist for mobility and adls--family is nearby and one sister is currently caring for his mother, in her home. Will continue to work towards goals.    Follow Up Recommendations  SNF;Supervision/Assistance - 24 hour;Home health OT    Equipment Recommendations  (pt does not want toilet DME)    Recommendations for Other Services      Precautions / Restrictions Precautions Precautions: Fall Precaution Comments: baseline O2 dependent at night only--2L; monitor O2/HR; abdominal sg Restrictions Weight Bearing Restrictions: No       Mobility Bed Mobility         Supine to sit: Min assist     General bed mobility comments: light min A to sit up with HOB raised.  Pt's bed at home does raise  Transfers   Equipment used: Rolling walker (2 wheeled)   Sit to Stand: Min guard Stand pivot transfers: Min guard;From elevated surface;+2 safety/equipment       General transfer comment: for safety, lines from elevated bed    Balance                                           ADL either performed or assessed with clinical judgement   ADL Overall ADL's : Needs assistance/impaired             Lower Body Bathing: Minimal assistance;Sit to/from stand;With adaptive equipment(simulated long sponge)       Lower Body Dressing: Moderate assistance;Sit to/from stand;With  adaptive equipment Lower Body Dressing Details (indicate cue type and reason): simulated pillowcase as pants with reacher; he has a reacher, but his is broken.  Did not have wide sock aide to try; pt also has unna boots at this time Toilet Transfer: Minimal assistance;Stand-pivot;RW Toilet Transfer Details (indicate cue type and reason): to chair           General ADL Comments: further educated on AE for increased independence with adls and to protect incision.  Pt has several rings on his toilet, and he feels it is slightly lower than recliner. Pt has difficulty controlling descent when sitting.  Educated that 3:1 could replace rings and sit over toilet instead. He doesn't want to try this; prefers his set up.  His mother is currently staying with his sister.       Vision       Perception     Praxis      Cognition Arousal/Alertness: Awake/alert Behavior During Therapy: WFL for tasks assessed/performed Overall Cognitive Status: Within Functional Limits for tasks assessed                                          Exercises     Shoulder Instructions       General Comments  Pertinent Vitals/ Pain       Pain Assessment: No/denies pain  Home Living                                          Prior Functioning/Environment              Frequency  Min 2X/week        Progress Toward Goals  OT Goals(current goals can now be found in the care plan section)  Progress towards OT goals: Progressing toward goals     Plan      Co-evaluation                 AM-PAC OT "6 Clicks" Daily Activity     Outcome Measure   Help from another person eating meals?: None Help from another person taking care of personal grooming?: A Little Help from another person toileting, which includes using toliet, bedpan, or urinal?: A Lot Help from another person bathing (including washing, rinsing, drying)?: A Little Help from another person  to put on and taking off regular upper body clothing?: A Little Help from another person to put on and taking off regular lower body clothing?: A Lot 6 Click Score: 17    End of Session    OT Visit Diagnosis: Muscle weakness (generalized) (M62.81);Unsteadiness on feet (R26.81)   Activity Tolerance Patient tolerated treatment well   Patient Left in chair;with call bell/phone within reach   Nurse Communication          Time: 2694-8546 OT Time Calculation (min): 27 min  Charges: OT General Charges $OT Visit: 1 Visit OT Treatments $Self Care/Home Management : 8-22 mins $Therapeutic Activity: 8-22 mins  Lesle Chris, OTR/L Acute Rehabilitation Services [phone] [place] pager [phone] office 09/13/2019   [name] 09/13/2019, 9:29 AM

## 2019-09-13 NOTE — Progress Notes (Signed)
PROGRESS NOTE    Theodore [name]  IDP:[id number] DOB: Aug 23, 1956 DOA: 08/30/2019 PCP: Biagio Borg, MD   Subjective: Seen with nursing staff at bedside, denies any complaints. He was sitting on chair at bedside, trying to work with PT. Feels much better, transferred to [place].  Diet advanced to full liquids  Brief Narrative:   Theodore [name] is a 63 y.o. male with medical history significant for steroid-dependent COPD, chronic hypoxic respiratory failure, coronary artery disease, chronic leg swelling, and history of PE on Xarelto, now presenting to the emergency department for evaluation of shortness of breath.  He is found to be hypoxic with labored respirations with chest x-ray notable for chronic hyperinflation, emphysema without acute findings.  Covid-19 was negative.  Patient was originally admitted for underlying COPD exacerbation and treatment with steroids, breathing treatments and antibiotics.  During the hospitalization, patient developed abdominal pain on 09/04/2019 with CT abdomen/pelvis findings of pneumoperitoneum, and he underwent exploratory laparotomy without any significant findings to suggest any intra-abdominal pathology to explain the CAT scan findings.  He was difficult to wean from the ventilator, and was maintained on ventilatory support for 48 hours prior to successful extubation on 09/06/2019.  He continues with significant debility postoperative ileus.  Patient was transferred back to [facility] service from [facility] on 09/08/2019.  Assessment & Plan:   Principal Problem:   COPD with acute exacerbation ([place]) Active Problems:   COPD GOLD IV    COPD exacerbation (HCC)   Chronic respiratory failure assoc with cor pulmonale   CAD (coronary artery disease)   DOE (dyspnea on exertion)   Chronic anticoagulation   Chronic respiratory failure with hypoxia (HCC)   Recurrent pulmonary emboli (HCC)   Hyponatremia   Acute on chronic respiratory failure with hypoxia (HCC)  Pneumoperitoneum   Current chronic use of systemic steroids   Pressure injury of skin   Acute respiratory failure with hypoxia Acute COPD exacerbation Patient presenting to ED with progressive shortness of breath associated with increased respiratory drive likely secondary to his underlying COPD.  Patient 2-3 L oxygen at baseline.  Home medications include Symbicort, Singulair, prednisone 10 mg p.o. daily.  Chest x-ray notable for hyperexpanded lungs without other acute findings.  Hospital course complicated by exploratory laparotomy in which she was difficult to extubate postoperatively, remained on ventilatory support for 48 hours before being extubated on 09/06/2019. --Supplemental oxygen currently weaned down to 2 L nasal cannula, goal SPO2 >88% --Nebs with Brovana and Pulmicort twice daily --Duo nebs every 6 hours scheduled --Solu-Medrol 40 mg IV daily; until can tolerate oral Continue respiratory management, continue nebulization.  Pneumoperitoneum Postoperative ileus On 09/04/2019, patient developed abdominal pain.  Was noted to have pneumoperitoneum on CT abdomen/pelvis.  Emergently taken to the OR by general surgery and underwent exploratory laparotomy by Dr. Lucia Gaskins without any findings to suggest etiology noted on CT scan. Postoperative ileus resolving, had bowel movement on 11/4 AM. On clear liquids advance to full liquids by general surgery.  Acute blood loss anemia Drop 13.5 to 11.9 on 10/30; 12.4 today.  Blood oozing at surgical site dressing.  Currently holding anticoagulation. --Per surgery, avoid frequent dressing changes and provide reinforcement/ice packs --Continue to hold anticoagulation --Continue to monitor CBC daily  Paroxysmal atrial fibrillation New onset noted on 09/05/2019.  Likely exacerbated by acute illness.  Currently in normal sinus rhythm. On Xarelto outpatient for history of PE, currently on hold secondary to ABLA/oozing at surgical site. --Continue to  monitor on telemetry --metoprolol IV PRN  q3h to maintain HR <130  Essential hypertension On Maxide outpatient.  BP 158/78 this morning.  Continues to be n.p.o. secondary to his postoperative ileus; now NG tube back on LWS --Unable to use oral medications at this time --Labetalol  IV prn for SBP >170 or DBP >110 --hydralazine IV prn --Continue to monitor blood pressures closely  Bladder spasms: resolved Likely etiology from clogged Foley catheter, in which Foley was discontinued on 09/07/2019.  Patient reports no further issues.  Hx PE On Xarelto outpatient.  Currently holding anticoagulation due to acute blood loss anemia with oozing at surgical site. --Continue to monitor CBC daily   DVT prophylaxis: SCDs, holding Lovenox secondary to ABLA/oozing from surgical site Code Status: Full code Family Communication: None Disposition Plan: Continue inpatient, SDU level of care; further dependent on clinical course, therapy currently recommending SNF placement.   Consultants:   PCCM  General surgery  Procedures:   Exploratory laparotomy 10/26, Dr. Ezzard Standing  Intubation; extubated 09/06/2019  Foley catheter, removed 09/07/2019  Antimicrobials:   Zosyn 10/26 - 10/28  Diflucan 10/26 - 10/28     Objective: Vitals:   09/13/19 0600 09/13/19 0800 09/13/19 0900 09/13/19 1000  BP: (!) 149/108 (!) 153/90  (!) 145/62  Pulse: 96 87  (!) 105  Resp: Temp:   98.1 F (36.7 C)   TempSrc:   Oral   SpO2: 98% 100%  99%  Weight:      Height:        Intake/Output Summary (Last 24 hours) at 09/13/2019 1152 Last data filed at 09/13/2019 0900 Gross per 24 hour  Intake 245.04 ml  Output 1126 ml  Net -880.96 ml   Filed Weights   08/30/19 2331 09/04/19 2316  Weight: 104.3 kg 103 kg    Examination:  General exam: Appears calm and comfortable; appears older than stated age Respiratory system: Coarse breath sounds bilaterally with slightly decreased bases with mild late  expiratory wheezing, normal respiratory effort, on Venturi mask Cardiovascular system: S1 & S2 heard, RRR. No JVD, murmurs, rubs, gallops or clicks. No pedal edema. Gastrointestinal system: Abdomen is nondistended, soft and nontender. No organomegaly or masses felt. Normal bowel sounds heard.  Surgical site noted with dressing in place with slightly blood-tinged dressing Central nervous system: Alert and oriented. No focal neurological deficits. Extremities: Symmetric 5 x 5 power. Skin: No rashes, lesions or ulcers Psychiatry: Judgement and insight appear normal. Mood & affect appropriate.     Data Reviewed: I have personally reviewed following labs and imaging studies  CBC: Recent Labs  Lab 09/09/19 0217 09/10/19 0230 09/11/19 0259 09/12/19 0212 09/13/19 0207  WBC 18.3* 17.9* 15.1* 15.8* 14.5*  HGB 13.2 12.4* 11.5* 11.5* 11.1*  HCT 43.1 40.9 38.3* 38.1* 35.7*  MCV 102.4* 104.1* 106.4* 103.8* 101.4*  PLT 322 290 280 300 260   Basic Metabolic Panel: Recent Labs  Lab 09/09/19 0217 09/10/19 0230 09/11/19 0259 09/12/19 0212 09/13/19 0207  NA 140 143 145 144 139  K 4.5 4.2 4.1 3.9 3.9  CL 104 106 111 109 104  CO2 GLUCOSE 110* 112* 119* 90 93  BUN 26* 25*  CREATININE 0.79 0.73 0.68 0.88 0.81  CALCIUM 8.5* 8.1* 8.0* 8.4* 8.2*  MG 2.3 2.3 2.1 2.1 2.2   GFR: Estimated Creatinine Clearance: 111.6 mL/min (by C-G formula based on SCr of 0.81 mg/dL). Liver Function Tests: No results for input(s): AST, ALT, ALKPHOS, BILITOT, PROT, ALBUMIN  in the last 168 hours. No results for input(s): LIPASE, AMYLASE in the last 168 hours. No results for input(s): AMMONIA in the last 168 hours. Coagulation Profile: No results for input(s): INR, PROTIME in the last 168 hours. Cardiac Enzymes: No results for input(s): CKTOTAL, CKMB, CKMBINDEX, TROPONINI in the last 168 hours. BNP (last 3 results) No results for input(s): PROBNP in the last 8760 hours. HbA1C: No  results for input(s): HGBA1C in the last 72 hours. CBG: Recent Labs  Lab 09/10/19 0842 09/11/19 0837 09/12/19 0734 09/13/19 0813 09/13/19 0843  GLUCAP 107* 105* 81 66* 93   Lipid Profile: No results for input(s): CHOL, HDL, LDLCALC, TRIG, CHOLHDL, LDLDIRECT in the last 72 hours. Thyroid Function Tests: No results for input(s): TSH, T4TOTAL, FREET4, T3FREE, THYROIDAB in the last 72 hours. Anemia Panel: No results for input(s): VITAMINB12, FOLATE, FERRITIN, TIBC, IRON, RETICCTPCT in the last 72 hours. Sepsis Labs: No results for input(s): PROCALCITON, LATICACIDVEN in the last 168 hours.  Recent Results (from the past 240 hour(s))  Surgical PCR screen     Status: None   Collection Time: 09/04/19  7:51 PM   Specimen: Nasal Mucosa; Nasal Swab  Result Value Ref Range Status   MRSA, PCR NEGATIVE NEGATIVE Final   Staphylococcus aureus NEGATIVE NEGATIVE Final    Comment: (NOTE) The Xpert SA Assay (FDA approved for NASAL specimens in patients 65 years of age and older), is one component of a comprehensive surveillance program. It is not intended to diagnose infection nor to guide or monitor treatment. Performed at [facility], [address] [place], [place] [address]   MRSA PCR Screening     Status: None   Collection Time: 09/04/19 10:29 PM   Specimen: Nasal Mucosa; Nasopharyngeal  Result Value Ref Range Status   MRSA by PCR NEGATIVE NEGATIVE Final    Comment:        The GeneXpert MRSA Assay (FDA approved for NASAL specimens only), is one component of a comprehensive MRSA colonization surveillance program. It is not intended to diagnose MRSA infection nor to guide or monitor treatment for MRSA infections. Performed at [facility], [address] [place], [place] [address]          Radiology Studies: No results found.      Scheduled Meds: . arformoterol  15 mcg Nebulization BID  . bisacodyl  10 mg Rectal Daily  .  budesonide (PULMICORT) nebulizer solution  0.5 mg Nebulization BID  . chlorhexidine  15 mL Mouth Rinse BID  . Chlorhexidine Gluconate Cloth  6 each Topical Daily  . feeding supplement (ENSURE ENLIVE)  237 mL Oral BID BM  . fluticasone  2 spray Each Nare Daily  . ipratropium-albuterol  3 mL Nebulization Q6H  . lip balm  1 application Topical BID  . mouth rinse  15 mL Mouth Rinse q12n4p  . metoCLOPramide  5 mg Oral TID AC & HS  . pantoprazole (PROTONIX) IV  40 mg Intravenous Q12H  . predniSONE  30 mg Oral Q breakfast  . sodium chloride flush  3 mL Intravenous Q12H   Continuous Infusions: . sodium chloride 20 mL/hr at 09/07/19 0531  . dextrose 5 % and 0.9 % NaCl with KCl 20 mEq/L 10 mL/hr at 09/13/19 0900     LOS: 13 days    Time spent: 35 minutes spent on chart review, discussion with nursing staff, consultants, updating family and interview/physical exam; more than 50% of that time was spent in counseling and/or  coordination of care.    Divit Stipp A Sidonia Nutter, DO [facility] 09/13/2019, 11:52 AM

## 2019-09-13 NOTE — Progress Notes (Signed)
Apparently still oozing from staple site, will switch lovenox to ppx dose.     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  He developed abdominal pain & was found to have pneumoperitoneum on 10/26.  Subsequently was taken for exploratory laparotomy for possible pneumoperitoneum and ischemic small bowel. However, no underlying cause for pneumoperitoneum found. Remained intubated post-operatively, to ICU.  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  To OR with no source found. AF after surgery. 10/26 Ex-lap >> "he had no evidence of ischemia, perforation, or abnormal changes of his small bowel.  He did had a bolus of food with undigested food particles that bulged in the small bowel, and I suspect, that this is what gave the impression of pneumatosis in the small bowel."   10/27 Bronchospastic on vent, failed wean  10/28 Extubated 11/2 still SOB, required BIPAP from 11/1 to 11/2 am hours. KVO IVFs. Giving lasix, bumping up reflux rx for upper airway component  Consults:  Surgery   Procedures:  ETT 10/26 >> 10/28  Significant Diagnostic Tests:   ECHO 10/22 >> LVEF 65-70%, hyperdynamic function, no LVH, RV mildly enlarged, global RV function normal, LA normal, RA normal  ABD CXR 10/26 >> moderate  volume pneumoperitoneum concerning for hollow viscus perforation, dilated loops of small bowel concerning for SBO, RUL airspace opacity, large amt stool in right hemicolon  CT ABD 10/26 >> large volume of abdominopelvic pneumoperitoneum with dilated abnormal appearing small bowel in the left abdomen with fecalization and pneumatosis suspicious for ischemic small bowel, no significant free fluid, hemorrhage or abscess  Micro Data:  COVID 10/21 >> negative  MRSA PCR 10/26 >> negative   Antimicrobials:  Ceftriaxone x1 10/21  Azithromycin 10/21 >> 10/23  Doxycycline 1024 >> 10/26 Fluconazole 10/26 >> 10/27 Zosyn 10/26 >> 10/28  Interim history/subjective:  Cont to improve  Objective   Blood pressure (Abnormal) 153/90, pulse 87, temperature 98.1 F (36.7 C), temperature source Oral, resp. rate 17, height 6\' 4"  (1.93 m), weight 103 kg, SpO2 100 %.        Intake/Output Summary (Last 24 hours) at 09/13/2019 0913 Last data filed at 09/13/2019 0750 Gross per 24 hour  Intake 215.05 ml  Output 2126 ml  Net -1910.95 ml   Filed Weights   08/30/19 2331 09/04/19 2316  Weight: 104.3 kg 103 kg    Examination: General this is a 63 year old white male who is currently working w/ PT. Getting up to chair and in no distress HENT NCAT no JVD MMM, still w/ sig [employer] noises.  Pulm  Prolonged exhale phase Card RRR  abd not tender dressing intact, + bowel sounds Ext still w/ LE swelling LEs wrapped Neuro intact   Resolved Hospital Problem list     Assessment & Plan:   # Idiopathic Pneumoperitoneum- ex  lap unrevealing # Postop ileus- tolerating claming trial and passing gas but abd still quiet  # AECOPD-complicated further by ileus and decreased cAbd.  # Afib, hx of PE- still off AC # leukocytosis  # anemia of critical illness superimposed on macrocytic anemia  Discussion Continues to improve. Think he has rounded the corner.   Plan/rec Cont BDs and ICS Cont flutter Cont to push lasix  as long as BP/BUN and cr allow Changed to pred yesterday would taper this off over next 7-10d  Cont PPI BID Would cont AC/HS reglan until Upper airway noises subside Wean oxygen  Mobilize  LMWH to resume when cleared by surgery Cont tussionex for cough     Will s/o he follows at our office. Will place f/u appointment   Erick Colace ACNP-BC [employer] Pager # [phone] OR # [phone] if no answer

## 2019-09-13 NOTE — Progress Notes (Signed)
Hypoglycemic Event  CBG: 66 at 0813  Treatment: 8 oz orange juice  Symptoms: None identified  Follow-up CBG: Time: 0843  CBG Result:93  Possible Reasons for Event: No intake during sleep hours

## 2019-09-13 NOTE — Progress Notes (Signed)
Assessment & Plan: Pneumoperitoneum POD#8 - status postEXPLORATORY LAPAROTOMY-10/26 Dr. Lucia Gaskins -No intra-abdominal pathology to explain pneumoperitoneum -BM this AM - tolerating clear liquids - advance to full liquid diet today -OOB,mobilize, PT/OT - ambulated in hall yesterday -dry dressing to abd wound - some oozing from lower wound overnight - holding Lovenox - WBC stable at 14.5K - ? steroids  GERD CAD,hx ofNSTEMI Hx ofPE on Xarelto Acute on Chronic Hypoxemic Respiratory Failure/Acute Exacerbation of COPD-extubated 10/28,per CCM Atrial fibrillation- currently in NSR ABL anemia - Hgb 11.9 from 13.5  ID -zosyn 10/26>>10/28, diflucan 10/26>>10/27 FEN -IVF,FLD VTE -SCDs,holdlovenox Foley -out        [name], MD       [facility]       Office: [phone]   Chief Complaint: Pneumoperitoneum  Subjective: Patient in bed, brighter, alert.  No complaints.  Ambulated in halls yesterday.  Wants to advance diet.  Objective: Vital signs in last 24 hours: Temp:  [97.1 F (36.2 C)-98.1 F (36.7 C)] 97.5 F (36.4 C) (11/04 0324) Pulse Rate:  [68-132] 96 (11/04 0600) Resp:  [14-22] 16 (11/04 0600) BP: (98-156)/(55-108) 149/108 (11/04 0600) SpO2:  [95 %-100 %] 98 % (11/04 0600) FiO2 (%):  [31 %] 31 % (11/03 0802) Last BM Date: 09/11/19  Intake/Output from previous day: 11/03 0701 - 11/04 0700 In: 232 [I.V.:232] Out: 2126 [Urine:2125; Stool:1] Intake/Output this shift: No intake/output data recorded.  Physical Exam: HEENT - sclerae clear, mucous membranes moist Neck - soft Abdomen - protuberant, softer; BS present; ABD with small sanguinous drainage Neuro - alert & oriented, no focal deficits  Lab Results:  Recent Labs    09/12/19 0212 09/13/19 0207  WBC 15.8* 14.5*  HGB 11.5* 11.1*  HCT 38.1* 35.7*  PLT 300 260   BMET Recent Labs    09/12/19 0212 09/13/19 0207  NA 144 139  K 3.9 3.9  CL 109 104  CO2  26 28  GLUCOSE 90 93  BUN 23 25*  CREATININE 0.88 0.81  CALCIUM 8.4* 8.2*   PT/INR No results for input(s): LABPROT, INR in the last 72 hours. Comprehensive Metabolic Panel:    Component Value Date/Time   NA 139 09/13/2019 0207   NA 144 09/12/2019 0212   NA 140 08/07/2017   NA 136 (A) 07/28/2017   K 3.9 09/13/2019 0207   K 3.9 09/12/2019 0212   CL 104 09/13/2019 0207   CL 109 09/12/2019 0212   CO2 28 09/13/2019 0207   CO2 26 09/12/2019 0212   BUN 25 (H) 09/13/2019 0207   BUN 23 09/12/2019 0212   BUN 12 08/07/2017   BUN 10 07/28/2017   CREATININE 0.81 09/13/2019 0207   CREATININE 0.88 09/12/2019 0212   GLUCOSE 93 09/13/2019 0207   GLUCOSE 90 09/12/2019 0212   CALCIUM 8.2 (L) 09/13/2019 0207   CALCIUM 8.4 (L) 09/12/2019 0212   AST 21 09/01/2019 0309   AST 19 08/31/2019 0321   ALT 15 09/01/2019 0309   ALT 17 08/31/2019 0321   ALKPHOS 47 09/01/2019 0309   ALKPHOS 54 08/31/2019 0321   BILITOT 0.8 09/01/2019 0309   BILITOT 0.6 08/31/2019 0321   PROT 6.2 (L) 09/01/2019 0309   PROT 6.7 08/31/2019 0321   ALBUMIN 3.3 (L) 09/01/2019 0309   ALBUMIN 3.7 08/31/2019 0321    Studies/Results: Dg Chest Port 1 View  Result Date: 09/11/2019 CLINICAL DATA:  Dyspnea EXAM: PORTABLE CHEST 1 VIEW COMPARISON:  09/06/2019 FINDINGS: The heart and mediastinum  are normal. Pulmonary hyperinflation and emphysema without acute airspace opacity. Esophagogastric tube with tip below the diaphragm but side-port at the level of the gastroesophageal junction. IMPRESSION: 1. Pulmonary hyperinflation and emphysema without acute airspace opacity. 2. Esophagogastric tube with tip below the diaphragm but side-port at the level of the gastroesophageal junction. Recommend advancement to ensure subdiaphragmatic position. Electronically Signed   By: Lauralyn Primes M.D.   On: 09/11/2019 09:11      Darnell Level 09/13/2019  Patient ID: Theodore [name], male   DOB: 1956-01-20, 63 y.o.   MRN: [id number]

## 2019-09-14 DIAGNOSIS — Z7901 Long term (current) use of anticoagulants: Secondary | ICD-10-CM

## 2019-09-14 DIAGNOSIS — J449 Chronic obstructive pulmonary disease, unspecified: Secondary | ICD-10-CM

## 2019-09-14 DIAGNOSIS — J9610 Chronic respiratory failure, unspecified whether with hypoxia or hypercapnia: Secondary | ICD-10-CM

## 2019-09-14 LAB — BASIC METABOLIC PANEL
Anion gap: 10 (ref 5–15)
BUN: 22 mg/dL (ref 8–23)
CO2: 33 mmol/L — ABNORMAL HIGH (ref 22–32)
Calcium: 8.8 mg/dL — ABNORMAL LOW (ref 8.9–10.3)
Chloride: 98 mmol/L (ref 98–111)
Creatinine, Ser: 0.97 mg/dL (ref 0.61–1.24)
GFR calc Af Amer: >60 mL/min (ref >60)
GFR calc non Af Amer: >60 mL/min (ref >60)
Glucose, Bld: 93 mg/dL (ref 70–99)
Potassium: 3.4 mmol/L — ABNORMAL LOW (ref 3.5–5.1)
Sodium: 141 mmol/L (ref 135–145)

## 2019-09-14 LAB — CBC
HCT: 38 % — ABNORMAL LOW (ref 39.0–52.0)
Hemoglobin: 11.9 g/dL — ABNORMAL LOW (ref 13.0–17.0)
MCH: 31.2 pg (ref 26.0–34.0)
MCHC: 31.3 g/dL (ref 30.0–36.0)
MCV: 99.7 fL (ref 80.0–100.0)
Platelets: 281 10*3/uL (ref 150–400)
RBC: 3.81 MIL/uL — ABNORMAL LOW (ref 4.22–5.81)
RDW: 13.7 % (ref 11.5–15.5)
WBC: 12.7 10*3/uL — ABNORMAL HIGH (ref 4.0–10.5)
nRBC: 0 % (ref 0.0–0.2)

## 2019-09-14 LAB — MAGNESIUM: Magnesium: 1.9 mg/dL (ref 1.7–2.4)

## 2019-09-14 LAB — GLUCOSE, CAPILLARY: Glucose-Capillary: 96 mg/dL (ref 70–99)

## 2019-09-14 MED ORDER — PANTOPRAZOLE SODIUM 40 MG PO TBEC
40.0000 mg | DELAYED_RELEASE_TABLET | Freq: Two times a day (BID) | ORAL | Status: DC
  Administered 2019-09-14 – 2019-09-18 (×8): 40 mg via ORAL
  Filled 2019-09-14 (×8): qty 1

## 2019-09-14 MED ORDER — IPRATROPIUM-ALBUTEROL 0.5-2.5 (3) MG/3ML IN SOLN
3.0000 mL | Freq: Three times a day (TID) | RESPIRATORY_TRACT | Status: DC
  Administered 2019-09-14 – 2019-09-18 (×14): 3 mL via RESPIRATORY_TRACT
  Filled 2019-09-14 (×14): qty 3

## 2019-09-14 MED ORDER — SODIUM CHLORIDE 0.9 % IV SOLN
INTRAVENOUS | Status: DC | PRN
  Administered 2019-09-14: 250 mL via INTRAVENOUS

## 2019-09-14 MED ORDER — MAGNESIUM SULFATE 2 GM/50ML IV SOLN
2.0000 g | Freq: Once | INTRAVENOUS | Status: AC
  Administered 2019-09-14: 2 g via INTRAVENOUS
  Filled 2019-09-14: qty 50

## 2019-09-14 MED ORDER — POTASSIUM CHLORIDE CRYS ER 20 MEQ PO TBCR
40.0000 meq | EXTENDED_RELEASE_TABLET | Freq: Four times a day (QID) | ORAL | Status: AC
  Administered 2019-09-14 (×2): 40 meq via ORAL
  Filled 2019-09-14 (×2): qty 2

## 2019-09-14 NOTE — Progress Notes (Signed)
[facility] Progress Note  10 Days Post-Op  Subjective: CC: had some oozing from lower wound yesterday Patient reports some incisional abdominal pain but comfortable overall. Tolerating FLD and having bowel function. Denies nausea or bloating. Had some bloody drainage from lower part of abdominal wound yesterday.   Objective: Vital signs in last 24 hours: Temp:  [97.7 F (36.5 C)-98.5 F (36.9 C)] 97.8 F (36.6 C) (11/05 0511) Pulse Rate:  [65-108] 67 (11/05 0511) Resp:  [12-22] 18 (11/05 0511) BP: (109-153)/(62-90) 133/79 (11/05 0511) SpO2:  [94 %-100 %] 99 % (11/05 0737) Last BM Date: 09/12/19  Intake/Output from previous day: 11/04 0701 - 11/05 0700 In: 633 [P.O.:600; I.V.:33] Out: 0174 [Urine:3475] Intake/Output this shift: No intake/output data recorded.  PE: Gen:  Alert, NAD, pleasant Card:  Regular rate and rhythm Pulm:  Normal effort, diminished bilaterally, junky sounding cough Abd: Soft, minimally ttp around incision, mildly distended, +BS, ecchymosis of abdominal wall, incision clean and intact with staples present, minimal amount of bloody drainage at inferior incision without surrounding erythema or purulence  Skin: warm and dry, no rashes  Psych: A&Ox3   Lab Results:  Recent Labs    09/13/19 0207 09/14/19 0513  WBC 14.5* 12.7*  HGB 11.1* 11.9*  HCT 35.7* 38.0*  PLT 260 281   BMET Recent Labs    09/13/19 0207 09/14/19 0513  NA 139 141  K 3.9 3.4*  CL 104 98  CO2 28 33*  GLUCOSE 93 93  BUN 25* 22  CREATININE 0.81 0.97  CALCIUM 8.2* 8.8*   PT/INR No results for input(s): LABPROT, INR in the last 72 hours. CMP     Component Value Date/Time   NA 141 09/14/2019 0513   NA 140 08/07/2017   K 3.4 (L) 09/14/2019 0513   CL 98 09/14/2019 0513   CO2 33 (H) 09/14/2019 0513   GLUCOSE 93 09/14/2019 0513   BUN 22 09/14/2019 0513   BUN 12 08/07/2017   CREATININE 0.97 09/14/2019 0513   CALCIUM 8.8 (L) 09/14/2019 0513   PROT 6.2 (L)  09/01/2019 0309   ALBUMIN 3.3 (L) 09/01/2019 0309   AST 21 09/01/2019 0309   ALT 15 09/01/2019 0309   ALKPHOS 47 09/01/2019 0309   BILITOT 0.8 09/01/2019 0309   GFRNONAA >60 09/14/2019 0513   GFRAA >60 09/14/2019 0513   Lipase     Component Value Date/Time   LIPASE 20 02/03/2012 2022       Studies/Results: No results found.  Anti-infectives: Anti-infectives (From admission, onward)   Start     Dose/Rate Route Frequency Ordered Stop   09/04/19 1800  fluconazole (DIFLUCAN) IVPB 100 mg  Status:  Discontinued     100 mg 50 mL/hr over 60 Minutes Intravenous Every 24 hours 09/04/19 1626 09/05/19 0909   09/04/19 1800  piperacillin-tazobactam (ZOSYN) IVPB 3.375 g  Status:  Discontinued     3.375 g 12.5 mL/hr over 240 Minutes Intravenous Every 8 hours 09/04/19 1644 09/06/19 0838   09/02/19 1000  doxycycline (VIBRA-TABS) tablet 100 mg  Status:  Discontinued     100 mg Oral Every 12 hours 09/02/19 0941 09/04/19 1627   08/31/19 0245  azithromycin (ZITHROMAX) 500 mg in sodium chloride 0.9 % 250 mL IVPB  Status:  Discontinued     500 mg 250 mL/hr over 60 Minutes Intravenous Daily at bedtime 08/31/19 0239 09/02/19 0941   08/31/19 0130  cefTRIAXone (ROCEPHIN) 1 g in sodium chloride 0.9 % 100 mL IVPB  1 g 200 mL/hr over 30 Minutes Intravenous  Once 08/31/19 0121 08/31/19 0255       Assessment/Plan GERD CAD,hx ofNSTEMI Hx ofPE on Xarelto Acute on Chronic Hypoxemic Respiratory Failure/Acute Exacerbation of COPD-extubated 10/28,per CCM Atrial fibrillation- currently in NSR ABL anemia - Hgb 11.9, stable  Pneumoperitoneum POD#10- status postEXPLORATORY LAPAROTOMY-10/26 Dr. Ezzard Standing -No intra-abdominal pathology to explain pneumoperitoneum -BMthis AM - tolerating clear liquids - advance to full liquid diet today -OOB,mobilize, PT/OT- ambulated in hall yesterday -dry dressing to abd wound -some oozing from lower wound overnight - holding Lovenox, could resume  xarelto tomorrow likely if tolerating increase in diet - CBJ62.8  ID -zosyn 10/26>>10/28, diflucan 10/26>>10/27 FEN -CM/HH diet  VTE -SCDs,holdlovenox Foley -out  Advance diet. Continue to hold anticoagulation today, I think can likely resume home xarelto tomorrow. If tolerating diet and having bowel function then surgically cleared for discharge. Will start getting follow up arranged and in chart.   LOS: 14 days    Wells Guiles , [facility] 09/14/2019, 7:43 AM Please see Amion for pager number during day hours 7:00am-4:30pm

## 2019-09-14 NOTE — Progress Notes (Signed)
Physical Therapy Treatment Patient Details Name: Theodore [name] MRN: [id number] DOB: 06-Nov-1956 Today's Date: 09/14/2019    History of Present Illness 63 y.o. male with medical history significant for steroid-dependent COPD, chronic hypoxic respiratory failure, coronary artery disease, chronic leg swelling, and history of PE on Xarelto, now presenting to the emergency department for evaluation of shortness of breath. Dx: COPD exacerbation. S/P ex lap 10/26 due to pneumoperitoneum. Extubated 10/28.    PT Comments    SATURATION QUALIFICATIONS: (This note is used to comply with regulatory documentation for home oxygen)  Patient Saturations on Room Air at Rest = 92%  Patient Saturations on Room Air while Ambulating 37 feet = 81% and HR 146  Patient Saturations on 3 Liters of oxygen while Ambulating = 91%  Please briefly explain why patient needs home oxygen:  Pt required supplemental oxygen to achieve therapeutic level  Pt OOB in recliner on 3 lts nasal at rest 96% and HR 119.  Feeling "okay".  Assisted with amb.  General Gait Details: trial amb with RA during gait.  Lowest O2 was 81% and highest HR 146.  Reapplied 3 lts to achieve 91%.  Decreased distance this session due to increased c/o dyspnea.  Allowed a rest period and performed purse lip breathing.  Present with 3/4 dyspnea and a wet, junky cough.      Follow Up Recommendations  SNF;Home health PT;Supervision/Assistance - 24 hour  Pt would more benefit from ST Rehab at SNF or even CIR.   Equipment Recommendations  None recommended by PT    Recommendations for Other Services       Precautions / Restrictions Precautions Precautions: Fall Precaution Comments: baseline O2 dependent at night only--2L; monitor O2/HR; abdominal sg    Mobility  Bed Mobility               General bed mobility comments: OOB in recliner  Transfers Overall transfer level: Needs assistance Equipment used: Rolling walker (2  wheeled) Transfers: Sit to/from Stand Sit to Stand: Min guard Stand pivot transfers: Min guard;From elevated surface;+2 safety/equipment       General transfer comment: 25% VC's on proper walker to self distance and safety with turns  Ambulation/Gait Ambulation/Gait assistance: Min guard;+2 safety/equipment Gait Distance (Feet): 37 Feet Assistive device: Rolling walker (2 wheeled) Gait Pattern/deviations: Step-through pattern;Decreased stride length Gait velocity: decreased   General Gait Details: trial amb with RA during gait.  Lowest O2 was 81% and highest HR 146.  Reapplied 3 lts to achieve 91%.  Decreased distance this session due to increased c/o dyspnea.  Allowed a rest period and performed purse lip breathing   Stairs             Wheelchair Mobility    Modified Rankin (Stroke Patients Only)       Balance                                            Cognition Arousal/Alertness: Awake/alert Behavior During Therapy: WFL for tasks assessed/performed Overall Cognitive Status: Within Functional Limits for tasks assessed                                 General Comments: motivated, pleasant      Exercises      General Comments  Pertinent Vitals/Pain Pain Assessment: No/denies pain    Home Living                      Prior Function            PT Goals (current goals can now be found in the care plan section)      Frequency    Min 3X/week      PT Plan Current plan remains appropriate    Co-evaluation              AM-PAC PT "6 Clicks" Mobility   Outcome Measure  Help needed turning from your back to your side while in a flat bed without using bedrails?: A Lot Help needed moving from lying on your back to sitting on the side of a flat bed without using bedrails?: A Lot Help needed moving to and from a bed to a chair (including a wheelchair)?: A Lot Help needed standing up from a chair  using your arms (e.g., wheelchair or bedside chair)?: A Lot Help needed to walk in hospital room?: A Lot Help needed climbing 3-5 steps with a railing? : Total 6 Click Score: 11    End of Session Equipment Utilized During Treatment: Gait belt Activity Tolerance: Patient limited by fatigue Patient left: in chair;with call bell/phone within reach;with family/visitor present Nurse Communication: Mobility status PT Visit Diagnosis: Difficulty in walking, not elsewhere classified (R26.2);Muscle weakness (generalized) (M62.81)     Time: 2951-8841 PT Time Calculation (min) (ACUTE ONLY): 25 min  Charges:  $Gait Training: 8-22 mins $Therapeutic Activity: 8-22 mins                     Felecia Shelling  PTA Acute  Rehabilitation Services Pager      [phone] Office      [phone]

## 2019-09-14 NOTE — Progress Notes (Signed)
PROGRESS NOTE    Theodore [name]  CXK:[id number] DOB: 1956-05-02 DOA: 08/30/2019 PCP: Biagio Borg, MD   Subjective: Denies any new complaints, he feels better overall. On full liquids, tolerating advance diet per general surgery. Continue to hold anticoagulation for today. PT recommended SNF versus home health PT, CSW to evaluate.  Brief Narrative:   Theodore [name] is a 63 y.o. male with medical history significant for steroid-dependent COPD, chronic hypoxic respiratory failure, coronary artery disease, chronic leg swelling, and history of PE on Xarelto, now presenting to the emergency department for evaluation of shortness of breath.  He is found to be hypoxic with labored respirations with chest x-ray notable for chronic hyperinflation, emphysema without acute findings.  Covid-19 was negative.  Patient was originally admitted for underlying COPD exacerbation and treatment with steroids, breathing treatments and antibiotics.  During the hospitalization, patient developed abdominal pain on 09/04/2019 with CT abdomen/pelvis findings of pneumoperitoneum, and he underwent exploratory laparotomy without any significant findings to suggest any intra-abdominal pathology to explain the CAT scan findings.  He was difficult to wean from the ventilator, and was maintained on ventilatory support for 48 hours prior to successful extubation on 09/06/2019.  He continues with significant debility postoperative ileus.  Patient was transferred back to [facility] service from [facility] on 09/08/2019.  Assessment & Plan:   Principal Problem:   COPD with acute exacerbation ([place]) Active Problems:   COPD GOLD IV    COPD exacerbation (HCC)   Chronic respiratory failure assoc with cor pulmonale   CAD (coronary artery disease)   DOE (dyspnea on exertion)   Chronic anticoagulation   Chronic respiratory failure with hypoxia (HCC)   Recurrent pulmonary emboli (HCC)   Hyponatremia   Acute on chronic respiratory  failure with hypoxia (HCC)   Pneumoperitoneum   Current chronic use of systemic steroids   Pressure injury of skin   Acute respiratory failure with hypoxia Acute COPD exacerbation Patient presenting to ED with progressive shortness of breath associated with increased respiratory drive likely secondary to his underlying COPD.  Patient 2-3 L oxygen at baseline.  Home medications include Symbicort, Singulair, prednisone 10 mg p.o. daily.  Chest x-ray notable for hyperexpanded lungs without other acute findings.  Hospital course complicated by exploratory laparotomy in which she was difficult to extubate postoperatively, remained on ventilatory support for 48 hours before being extubated on 09/06/2019. Continue supplemental oxygen, continue Brovana and Pulmicort. Was on Solu-Medrol, discontinue Continue aggressive diuresis  Pneumoperitoneum Postoperative ileus On 09/04/2019, patient developed abdominal pain.  Was noted to have pneumoperitoneum on CT abdomen/pelvis.  Emergently taken to the OR by general surgery and underwent exploratory laparotomy by Dr. Lucia Gaskins without any findings to suggest etiology noted on CT scan. Postoperative ileus resolving, had bowel movement on 11/4 AM. On full liquids, advance diet per general surgery.  Acute blood loss anemia Drop 13.5 to 11.9 on 10/30; 12.4 today.  Blood oozing at surgical site dressing.  Currently holding anticoagulation. --Per surgery, avoid frequent dressing changes and provide reinforcement/ice packs --Continue to hold anticoagulation --Continue to monitor CBC daily  Paroxysmal atrial fibrillation New onset noted on 09/05/2019.  Likely exacerbated by acute illness.  Currently in normal sinus rhythm. On Xarelto outpatient for history of PE, currently on hold secondary to ABLA/oozing at surgical site. --Continue to monitor on telemetry --metoprolol IV PRN q3h to maintain HR <130  Essential hypertension On Maxide outpatient.  BP 158/78 this  morning.  Continues to be n.p.o. secondary to his postoperative ileus;  now NG tube back on LWS --Unable to use oral medications at this time --Labetalol 10mg  IV prn for SBP >170 or DBP >110 --hydralazine IV prn --Continue to monitor blood pressures closely  Bladder spasms: resolved Likely etiology from clogged Foley catheter, in which Foley was discontinued on 09/07/2019.  Patient reports no further issues.  Hx PE On Xarelto outpatient.  Currently holding anticoagulation due to acute blood loss anemia with oozing at surgical site. --Continue to monitor CBC daily   DVT prophylaxis: SCDs, holding Lovenox secondary to ABLA/oozing from surgical site Code Status: Full code Family Communication: None Disposition Plan: Continue inpatient, SDU level of care; further dependent on clinical course, therapy currently recommending SNF placement.   Consultants:   PCCM  General surgery  Procedures:   Exploratory laparotomy 10/26, Dr. 11/26  Intubation; extubated 09/06/2019  Foley catheter, removed 09/07/2019  Antimicrobials:   Zosyn 10/26 - 10/28  Diflucan 10/26 - 10/28     Objective: Vitals:   09/13/19 2040 09/13/19 2054 09/14/19 0511 09/14/19 0737  BP: 129/82  133/79   Pulse: 65  67   Resp: 20  18   Temp: 97.7 F (36.5 C)  97.8 F (36.6 C)   TempSrc: Oral  Oral   SpO2: 97% 98% 100% 99%  Weight:      Height:        Intake/Output Summary (Last 24 hours) at 09/14/2019 1047 Last data filed at 09/14/2019 1000 Gross per 24 hour  Intake 600 ml  Output 4275 ml  Net -3675 ml   Filed Weights   08/30/19 2331 09/04/19 2316  Weight: 104.3 kg 103 kg    Examination:  General exam: Appears calm and comfortable; appears older than stated age Respiratory system: Coarse breath sounds bilaterally with slightly decreased bases with mild late expiratory wheezing, normal respiratory effort, on Venturi mask Cardiovascular system: S1 & S2 heard, RRR. No JVD, murmurs, rubs, gallops  or clicks. No pedal edema. Gastrointestinal system: Abdomen is nondistended, soft and nontender. No organomegaly or masses felt. Normal bowel sounds heard.  Surgical site noted with dressing in place with slightly blood-tinged dressing Central nervous system: Alert and oriented. No focal neurological deficits. Extremities: Symmetric 5 x 5 power. Skin: No rashes, lesions or ulcers Psychiatry: Judgement and insight appear normal. Mood & affect appropriate.     Data Reviewed: I have personally reviewed following labs and imaging studies  CBC: Recent Labs  Lab 09/10/19 0230 09/11/19 0259 09/12/19 0212 09/13/19 0207 09/14/19 0513  WBC 17.9* 15.1* 15.8* 14.5* 12.7*  HGB 12.4* 11.5* 11.5* 11.1* 11.9*  HCT 40.9 38.3* 38.1* 35.7* 38.0*  MCV 104.1* 106.4* 103.8* 101.4* 99.7  PLT 290 280 300 260 281   Basic Metabolic Panel: Recent Labs  Lab 09/10/19 0230 09/11/19 0259 09/12/19 0212 09/13/19 0207 09/14/19 0513  NA 143 145 144 139 141  K 4.2 4.1 3.9 3.9 3.4*  CL 106 111 109 104 98  CO2 27 25 26 28  33*  GLUCOSE 112* 119* 90 93 93  BUN 23 19 23  25* 22  CREATININE 0.73 0.68 0.88 0.81 0.97  CALCIUM 8.1* 8.0* 8.4* 8.2* 8.8*  MG 2.3 2.1 2.1 2.2 1.9   GFR: Estimated Creatinine Clearance: 93.2 mL/min (by C-G formula based on SCr of 0.97 mg/dL). Liver Function Tests: No results for input(s): AST, ALT, ALKPHOS, BILITOT, PROT, ALBUMIN in the last 168 hours. No results for input(s): LIPASE, AMYLASE in the last 168 hours. No results for input(s): AMMONIA in the last 168 hours. Coagulation  Profile: No results for input(s): INR, PROTIME in the last 168 hours. Cardiac Enzymes: No results for input(s): CKTOTAL, CKMB, CKMBINDEX, TROPONINI in the last 168 hours. BNP (last 3 results) No results for input(s): PROBNP in the last 8760 hours. HbA1C: No results for input(s): HGBA1C in the last 72 hours. CBG: Recent Labs  Lab 09/11/19 0837 09/12/19 0734 09/13/19 0813 09/13/19 0843 09/14/19  0737  GLUCAP 105* 81 66* 93 96   Lipid Profile: No results for input(s): CHOL, HDL, LDLCALC, TRIG, CHOLHDL, LDLDIRECT in the last 72 hours. Thyroid Function Tests: No results for input(s): TSH, T4TOTAL, FREET4, T3FREE, THYROIDAB in the last 72 hours. Anemia Panel: No results for input(s): VITAMINB12, FOLATE, FERRITIN, TIBC, IRON, RETICCTPCT in the last 72 hours. Sepsis Labs: No results for input(s): PROCALCITON, LATICACIDVEN in the last 168 hours.  Recent Results (from the past 240 hour(s))  Surgical PCR screen     Status: None   Collection Time: 09/04/19  7:51 PM   Specimen: Nasal Mucosa; Nasal Swab  Result Value Ref Range Status   MRSA, PCR NEGATIVE NEGATIVE Final   Staphylococcus aureus NEGATIVE NEGATIVE Final    Comment: (NOTE) The Xpert SA Assay (FDA approved for NASAL specimens in patients 422 years of age and older), is one component of a comprehensive surveillance program. It is not intended to diagnose infection nor to guide or monitor treatment. Performed at [facility], [address] [place], [place] [address]   MRSA PCR Screening     Status: None   Collection Time: 09/04/19 10:29 PM   Specimen: Nasal Mucosa; Nasopharyngeal  Result Value Ref Range Status   MRSA by PCR NEGATIVE NEGATIVE Final    Comment:        The GeneXpert MRSA Assay (FDA approved for NASAL specimens only), is one component of a comprehensive MRSA colonization surveillance program. It is not intended to diagnose MRSA infection nor to guide or monitor treatment for MRSA infections. Performed at [facility], [address] [place], [place] [address]          Radiology Studies: No results found.      Scheduled Meds: . arformoterol  15 mcg Nebulization BID  . bisacodyl  10 mg Rectal Daily  . budesonide (PULMICORT) nebulizer solution  0.5 mg Nebulization BID  . chlorhexidine  15 mL Mouth Rinse BID  . Chlorhexidine Gluconate Cloth  6 each  Topical Daily  . feeding supplement (ENSURE ENLIVE)  237 mL Oral BID BM  . fluticasone  2 spray Each Nare Daily  . furosemide  40 mg Intravenous BID  . ipratropium-albuterol  3 mL Nebulization TID  . lip balm  1 application Topical BID  . mouth rinse  15 mL Mouth Rinse q12n4p  . metoCLOPramide  5 mg Oral TID AC & HS  . pantoprazole  40 mg Oral BID  . potassium chloride  40 mEq Oral Q6H  . predniSONE  30 mg Oral Q breakfast   Continuous Infusions:    LOS: 14 days    Time spent: 35 minutes spent on chart review, discussion with nursing staff, consultants, updating family and interview/physical exam; more than 50% of that time was spent in counseling and/or coordination of care.    Kenyata Napier A Elberta Lachapelle, DO [facility] 09/14/2019, 10:47 AM

## 2019-09-14 NOTE — TOC Initial Note (Signed)
Transition of Care [facility]) - Initial/Assessment Note    Patient Details  Name: Theodore [name] MRN: [id number] Date of Birth: 24-Jan-1956  [facility]) CM/SW Contact:    Lanier Clam, RN Phone Number: 09/14/2019, 4:04 PM  Clinical Narrative: Patient from home alone currently has sister support-Janice-they both agree to recc SNF,& agree to faxing out to facilities-await bed offers to offer choice,then will start  Auth. Will need covid results within 48hrs of d/c. MD updated.               Expected Discharge Plan: Skilled Nursing Facility Barriers to Discharge: Continued Medical Work up   Patient Goals and CMS Choice Patient states their goals for this hospitalization and ongoing recovery are:: go to rehab CMS Medicare.gov Compare Post Acute Care list provided to:: Patient Choice offered to / list presented to : Patient  Expected Discharge Plan and Services Expected Discharge Plan: Skilled Nursing Facility   Discharge Planning Services: CM Consult   Living arrangements for the past 2 months: Single Family Home                                      Prior Living Arrangements/Services Living arrangements for the past 2 months: Single Family Home Lives with:: Self Patient language and need for interpreter reviewed:: Yes Do you feel safe going back to the place where you live?: No   no one @ home.  Need for Family Participation in Patient Care: No (Comment) Care giver support system in place?: Yes (comment)   Criminal Activity/Legal Involvement Pertinent to Current Situation/Hospitalization: No - Comment as needed  Activities of Daily Living Home Assistive Devices/Equipment: Nebulizer, Oxygen, Wheelchair, Environmental consultant (specify type), Cane (specify quad or straight) ADL Screening (condition at time of admission) Patient's cognitive ability adequate to safely complete daily activities?: Yes Is the patient deaf or have difficulty hearing?: No Does the patient have  difficulty seeing, even when wearing glasses/contacts?: No Does the patient have difficulty concentrating, remembering, or making decisions?: No Patient able to express need for assistance with ADLs?: Yes Does the patient have difficulty dressing or bathing?: No Independently performs ADLs?: No Communication: Independent Dressing (OT): Independent with device (comment) Grooming: Independent with device (comment) Feeding: Independent with device (comment) Bathing: Independent with device (comment) Toileting: Independent with device (comment) In/Out Bed: Independent with device (comment) Walks in Home: Independent with device (comment) Does the patient have difficulty walking or climbing stairs?: Yes Weakness of Legs: Both(swelling ) Weakness of Arms/Hands: None  Permission Sought/Granted Permission sought to share information with : Case Manager Permission granted to share information with : Yes, Verbal Permission Granted  Share Information with NAME: [place] Clam RNCM [phone]  Permission granted to share info w AGENCY: SNF  Permission granted to share info w Relationship: sister Kristine Garbe [phone]     Emotional Assessment Appearance:: Appears stated age Attitude/Demeanor/Rapport: Gracious Affect (typically observed): Accepting Orientation: : Oriented to Self, Oriented to Place, Oriented to  Time, Oriented to Situation Alcohol / Substance Use: Not Applicable Psych Involvement: No (comment)  Admission diagnosis:  COPD exacerbation (HCC) [J44.1] Patient Active Problem List   Diagnosis Date Noted  . Pressure injury of skin 09/05/2019  . Pneumoperitoneum 09/04/2019  . Current chronic use of systemic steroids 09/04/2019  . COPD with acute exacerbation (HCC) 08/31/2019  . Hyponatremia 08/31/2019  . Acute on chronic respiratory failure with hypoxia (HCC)   .  Depression 08/22/2018  . Pre-diabetes 08/22/2018  . Trochanteric bursitis, left hip 11/12/2017  . Allergic  rhinitis 10/11/2017  . Constipation due to opioid therapy 08/06/2017  . Recurrent pulmonary emboli ([place]) 07/26/2017  . History of MI (myocardial infarction) 07/26/2017  . History of total hip replacement, left 07/26/2017  . Chronic respiratory failure with hypoxia ([place]) 07/16/2017  . Left displaced femoral neck fracture ([place]) 07/14/2017  . Closed displaced fracture of left femoral neck ([place]) 07/14/2017  . Hypokalemia   . Cardiac arrhythmia 07/06/2017  . Costochondral chest pain 12/28/2016  . Bilateral lower extremity edema 12/28/2016  . Chronic anticoagulation 12/23/2016  . DOE (dyspnea on exertion) 12/03/2016  . Chest pain 12/03/2016  . Anxiety state 10/30/2015  . Preventative health care 09/03/2014  . Back pain 09/03/2014  . HBP (high blood pressure) 01/29/2014  . CAD (coronary artery disease) 01/23/2014  . Chronic respiratory failure assoc with cor pulmonale 01/20/2014  . Acute cor pulmonale ([place]) 01/12/2014  . Multiple lung nodules 10/24/2013  . COPD exacerbation ([place]) 06/13/2013  . Smoking 03/03/2012  . COPD GOLD IV  09/28/2011   PCP:  Biagio Borg, MD Pharmacy:   [facility] [place], [place] - [address] [address] Phone: [phone] Fax: [phone]     Social Determinants of Health (SDOH) Interventions    Readmission Risk Interventions No flowsheet data found.

## 2019-09-14 NOTE — Care Management Important Message (Signed)
Important Message  Patient Details IM Letter given to Dessa Phi RN to present to the Patient  Name: BILAL MANZER MRN: [id number] Date of Birth: November 15, 1955   Medicare Important Message Given:  Yes     Kerin Salen 09/14/2019, 1:13 PM

## 2019-09-14 NOTE — Progress Notes (Signed)
The patient is receiving Protonix by the intravenous route.  Based on criteria approved by the Pharmacy and Therapeutics Committee and the Medical Executive Committee, the medication is being converted to the equivalent oral dose form.  These criteria include: -No active GI bleeding -Able to tolerate diet of full liquids (or better) or tube feeding -Able to tolerate other medications by the oral or enteral route  If you have any questions about this conversion, please contact the Pharmacy Department (phone [phone]).  Thank you.  Latifah Padin, PharmD Student 

## 2019-09-14 NOTE — [place] FL2 (Signed)
MEDICAID FL2 LEVEL OF CARE SCREENING TOOL     IDENTIFICATION  Patient Name: Theodore [name] Birthdate: 1956-09-14 Sex: male Admission Date (Current Location): 08/30/2019  [facility] and [place] Number:  Producer, television/film/video and Address:  [facility],  [address] [place] [address]      Provider Number: [id number]  Attending Physician Name and Address:  Clydia Llano, MD  Relative Name and Phone Number:  Kristine Garbe sister [phone]    Current Level of Care: Hospital Recommended Level of Care: Skilled Nursing Facility Prior Approval Number:    Date Approved/Denied:   PASRR Number: [id number]  Discharge Plan: SNF    Current Diagnoses: Patient Active Problem List   Diagnosis Date Noted  . Pressure injury of skin 09/05/2019  . Pneumoperitoneum 09/04/2019  . Current chronic use of systemic steroids 09/04/2019  . COPD with acute exacerbation (HCC) 08/31/2019  . Hyponatremia 08/31/2019  . Acute on chronic respiratory failure with hypoxia (HCC)   . Depression 08/22/2018  . Pre-diabetes 08/22/2018  . Trochanteric bursitis, left hip 11/12/2017  . Allergic rhinitis 10/11/2017  . Constipation due to opioid therapy 08/06/2017  . Recurrent pulmonary emboli (HCC) 07/26/2017  . History of MI (myocardial infarction) 07/26/2017  . History of total hip replacement, left 07/26/2017  . Chronic respiratory failure with hypoxia (HCC) 07/16/2017  . Left displaced femoral neck fracture (HCC) 07/14/2017  . Closed displaced fracture of left femoral neck (HCC) 07/14/2017  . Hypokalemia   . Cardiac arrhythmia 07/06/2017  . Costochondral chest pain 12/28/2016  . Bilateral lower extremity edema 12/28/2016  . Chronic anticoagulation 12/23/2016  . DOE (dyspnea on exertion) 12/03/2016  . Chest pain 12/03/2016  . Anxiety state 10/30/2015  . Preventative health care 09/03/2014  . Back pain 09/03/2014  . HBP (high blood pressure) 01/29/2014  . CAD (coronary  artery disease) 01/23/2014  . Chronic respiratory failure assoc with cor pulmonale 01/20/2014  . Acute cor pulmonale (HCC) 01/12/2014  . Multiple lung nodules 10/24/2013  . COPD exacerbation (HCC) 06/13/2013  . Smoking 03/03/2012  . COPD GOLD IV  09/28/2011    Orientation RESPIRATION BLADDER Height & Weight     Self, Time, Situation, Place  O2(02 3l [place]) Continent Weight: 103 kg Height:  6\' 4"  (193 cm)  BEHAVIORAL SYMPTOMS/MOOD NEUROLOGICAL BOWEL NUTRITION STATUS      Continent Diet(Heart healthy)  AMBULATORY STATUS COMMUNICATION OF NEEDS Skin   Limited Assist Verbally Surgical wounds(abdominal wound-dry dsg change daily.)                       Personal Care Assistance Level of Assistance  Bathing, Feeding, Dressing Bathing Assistance: Limited assistance Feeding assistance: Limited assistance Dressing Assistance: Limited assistance     Functional Limitations Info  Sight, Hearing, Speech Sight Info: Impaired(eyeglasses for reading) Hearing Info: Adequate Speech Info: Adequate    SPECIAL CARE FACTORS FREQUENCY  PT (By licensed PT), OT (By licensed OT)     PT Frequency: 5x week OT Frequency: 5x week            Contractures Contractures Info: Present    Additional Factors Info  Code Status, Allergies Code Status Info: full code Allergies Info: Naproxen Sodium           Current Medications (09/14/2019):  This is the current hospital active medication list Current Facility-Administered Medications  Medication Dose Route Frequency Provider Last Rate Last Dose  . 0.9 %  sodium chloride infusion  Intravenous PRN Verlee Monte, MD 10 mL/hr at 09/14/19 1204 250 mL at 09/14/19 1204  . acetaminophen (TYLENOL) tablet 650 mg  650 mg Oral Q6H PRN Verlee Monte, MD   650 mg at 09/13/19 2159   Or  . acetaminophen (TYLENOL) suppository 650 mg  650 mg Rectal Q6H PRN Verlee Monte, MD      . albuterol (PROVENTIL) (2.5 MG/3ML) 0.083% nebulizer solution 2.5 mg  2.5 mg  Nebulization Q4H PRN Verlee Monte, MD   2.5 mg at 09/10/19 1529  . alum & mag hydroxide-simeth (MAALOX/MYLANTA) 200-200-20 MG/5ML suspension 30 mL  30 mL Oral Q4H PRN Verlee Monte, MD   30 mL at 09/01/19 1304  . arformoterol (BROVANA) nebulizer solution 15 mcg  15 mcg Nebulization BID Verlee Monte, MD   15 mcg at 09/14/19 0737  . bisacodyl (DULCOLAX) suppository 10 mg  10 mg Rectal Daily Verlee Monte, MD   10 mg at 09/13/19 1136  . budesonide (PULMICORT) nebulizer solution 0.5 mg  0.5 mg Nebulization BID Verlee Monte, MD   0.5 mg at 09/14/19 0737  . chlorhexidine (PERIDEX) 0.12 % solution 15 mL  15 mL Mouth Rinse BID Verlee Monte, MD   15 mL at 09/14/19 0914  . Chlorhexidine Gluconate Cloth 2 % PADS 6 each  6 each Topical Daily Verlee Monte, MD   6 each at 09/13/19 1100  . chlorpheniramine-HYDROcodone (TUSSIONEX) 10-8 MG/5ML suspension 5 mL  5 mL Oral Q12H PRN Verlee Monte, MD      . clonazePAM (KLONOPIN) disintegrating tablet 0.25 mg  0.25 mg Oral BID PRN Verlee Monte, MD   0.25 mg at 09/13/19 2159  . feeding supplement (ENSURE ENLIVE) (ENSURE ENLIVE) liquid 237 mL  237 mL Oral BID BM Verlee Monte, MD   237 mL at 09/13/19 1357  . fentaNYL (SUBLIMAZE) injection 25 mcg  25 mcg Intravenous Q2H PRN Verlee Monte, MD   25 mcg at 09/12/19 0408  . fluticasone (FLONASE) 50 MCG/ACT nasal spray 2 spray  2 spray Each Nare Daily Verlee Monte, MD   2 spray at 09/14/19 1408  . furosemide (LASIX) injection 40 mg  40 mg Intravenous BID Verlee Monte, MD   40 mg at 09/14/19 8527  . hydrALAZINE (APRESOLINE) injection 10 mg  10 mg Intravenous Q4H PRN Verlee Monte, MD      . ipratropium-albuterol (DUONEB) 0.5-2.5 (3) MG/3ML nebulizer solution 3 mL  3 mL Nebulization TID Verlee Monte, MD   3 mL at 09/14/19 1343  . labetalol (NORMODYNE) injection 10 mg  10 mg Intravenous Q8H PRN Verlee Monte, MD   10 mg at 09/12/19 1212  . lip balm (CARMEX) ointment 1 application  1 application Topical BID Verlee Monte, MD    1 application at 78/24/23 2202  . magic mouthwash  15 mL Oral QID PRN Lenis Noon, RPH   15 mL at 09/09/19 1234  . MEDLINE mouth rinse  15 mL Mouth Rinse q12n4p Verlee Monte, MD   15 mL at 09/11/19 1522  . metoCLOPramide (REGLAN) tablet 5 mg  5 mg Oral TID AC & HS Verlee Monte, MD   5 mg at 09/14/19 1202  . ondansetron (ZOFRAN) injection 4 mg  4 mg Intravenous Q6H PRN Verlee Monte, MD   4 mg at 09/09/19 0600  . pantoprazole (PROTONIX) EC tablet 40 mg  40 mg Oral BID Abuzuaiter, Mohammed, Student-PharmD      . potassium chloride SA (KLOR-CON) CR tablet 40 mEq  40 mEq Oral Q6H Verlee Monte, MD  40 mEq at 09/14/19 1202  . predniSONE (DELTASONE) tablet 30 mg  30 mg Oral Q breakfast Clydia LlanoElmahi, Mutaz, MD   30 mg at 09/14/19 0914  . sodium chloride (OCEAN) 0.65 % nasal spray 1 spray  1 spray Each Nare PRN Clydia LlanoElmahi, Mutaz, MD   1 spray at 09/10/19 1053     Discharge Medications: Please see discharge summary for a list of discharge medications.  Relevant Imaging Results:  Relevant Lab Results:   Additional Information SSN:  [id number]  Lanier ClamMahabir, Kamrynn Melott, RN

## 2019-09-14 NOTE — Discharge Instructions (Signed)
[facility]      [facility] [phone]  OPEN ABDOMINAL SURGERY: POST OP INSTRUCTIONS  Always review your discharge instruction sheet given to you by the facility where your surgery was performed.  IF YOU HAVE DISABILITY OR FAMILY LEAVE FORMS, YOU MUST BRING THEM TO THE OFFICE FOR PROCESSING.  PLEASE DO NOT GIVE THEM TO YOUR DOCTOR.  1. A prescription for pain medication may be given to you upon discharge.  Take your pain medication as prescribed, if needed.  If narcotic pain medicine is not needed, then you may take acetaminophen (Tylenol) or ibuprofen (Advil) as needed. 2. Take your usually prescribed medications unless otherwise directed. 3. If you need a refill on your pain medication, please contact your pharmacy. They will contact our office to request authorization.  Prescriptions will not be filled after 5pm or on week-ends. 4. You should follow a light diet the first few days after arrival home, such as soup and crackers, pudding, etc.unless your doctor has advised otherwise. A high-fiber, low fat diet can be resumed as tolerated.   Be sure to include lots of fluids daily. Most patients will experience some swelling and bruising on the chest and neck area.  Ice packs will help.  Swelling and bruising can take several days to resolve 5. Most patients will experience some swelling and bruising in the area of the incision. Ice pack will help. Swelling and bruising can take several days to resolve..  6. It is common to experience some constipation if taking pain medication after surgery.  Increasing fluid intake and taking a stool softener will usually help or prevent this problem from occurring.  A mild laxative (Milk of Magnesia or Miralax) should be taken according to package directions if there are no bowel movements after 48 hours. 7.  You may have steri-strips (small skin tapes) in place directly over the incision.  These strips should be left on the skin for 7-10 days.  If your  surgeon used skin glue on the incision, you may shower in 24 hours.  The glue will flake off over the next 2-3 weeks.  Any sutures or staples will be removed at the office during your follow-up visit. You may find that a light gauze bandage over your incision may keep your staples from being rubbed or pulled. You may shower and replace the bandage daily. 8. ACTIVITIES:  You may resume regular (light) daily activities beginning the next day--such as daily self-care, walking, climbing stairs--gradually increasing activities as tolerated.  You may have sexual intercourse when it is comfortable.  Refrain from any heavy lifting or straining until approved by your doctor. a. You may drive when you no longer are taking prescription pain medication, you can comfortably wear a seatbelt, and you can safely maneuver your car and apply brakes  9. You should see your doctor in the office for a follow-up appointment approximately two weeks after your surgery.  Make sure that you call for this appointment within a day or two after you arrive home to insure a convenient appointment time.  WHEN TO CALL YOUR DOCTOR: 1. Fever over 101.0 2. Inability to urinate 3. Nausea and/or vomiting 4. Extreme swelling or bruising 5. Continued bleeding from incision. 6. Increased pain, redness, or drainage from the incision. 7. Difficulty swallowing or breathing 8. Muscle cramping or spasms. 9. Numbness or tingling in hands or feet or around lips.  The clinic staff is available to answer your questions during regular business hours.  Please   don't hesitate to call and ask to speak to one of the nurses if you have concerns.  For further questions, please visit [email or web address]   

## 2019-09-15 LAB — BASIC METABOLIC PANEL
Anion gap: 9 (ref 5–15)
BUN: 20 mg/dL (ref 8–23)
CO2: 33 mmol/L — ABNORMAL HIGH (ref 22–32)
Calcium: 8.3 mg/dL — ABNORMAL LOW (ref 8.9–10.3)
Chloride: 97 mmol/L — ABNORMAL LOW (ref 98–111)
Creatinine, Ser: 1.03 mg/dL (ref 0.61–1.24)
GFR calc Af Amer: >60 mL/min (ref >60)
GFR calc non Af Amer: >60 mL/min (ref >60)
Glucose, Bld: 93 mg/dL (ref 70–99)
Potassium: 3.8 mmol/L (ref 3.5–5.1)
Sodium: 139 mmol/L (ref 135–145)

## 2019-09-15 LAB — MAGNESIUM: Magnesium: 2.1 mg/dL (ref 1.7–2.4)

## 2019-09-15 LAB — GLUCOSE, CAPILLARY: Glucose-Capillary: 85 mg/dL (ref 70–99)

## 2019-09-15 LAB — SARS CORONAVIRUS 2 (TAT 6-24 HRS): SARS Coronavirus 2: NEGATIVE

## 2019-09-15 MED ORDER — POTASSIUM CHLORIDE CRYS ER 20 MEQ PO TBCR
40.0000 meq | EXTENDED_RELEASE_TABLET | Freq: Once | ORAL | Status: AC
  Administered 2019-09-15: 40 meq via ORAL
  Filled 2019-09-15: qty 2

## 2019-09-15 MED ORDER — RIVAROXABAN 20 MG PO TABS
20.0000 mg | ORAL_TABLET | Freq: Every day | ORAL | Status: DC
  Administered 2019-09-15 – 2019-09-18 (×4): 20 mg via ORAL
  Filled 2019-09-15 (×5): qty 1

## 2019-09-15 NOTE — TOC Progression Note (Signed)
Transition of Care [facility] - Progression Note    Patient Details  Name: Theodore [name] MRN: [id number] Date of Birth: 02-15-1956  [facility]) CM/SW Contact  Rc Amison, Juliann Pulse, RN Phone Number: 09/15/2019, 12:45 PM  Clinical Narrative:Per attending medically stable for d/c-CM called [employer] rep Crystal for auth for SNF @ [facility] will have a bed Monday.Awaiting covid results also.       Expected Discharge Plan: Skilled Nursing Facility Barriers to Discharge: Insurance Authorization  Expected Discharge Plan and Services Expected Discharge Plan: [place]   Discharge Planning Services: CM Consult   Living arrangements for the past 2 months: Single Family Home                                       Social Determinants of Health (SDOH) Interventions    Readmission Risk Interventions No flowsheet data found.

## 2019-09-15 NOTE — Progress Notes (Addendum)
PROGRESS NOTE    Theodore GuestJames W Valli  ZOX:[id number] DOB: 06-Dec-1955 DOA: 08/30/2019 PCP: [name], Pankaj W, MD   Subjective: No new complaints overall, feeling okay. Still has lower extremity edema, continue diuresis. Started efforts for placement, check COVID-19. Tolerating diet, regular bowel movements.  Discontinue cardiac monitor. History of PE and paroxysmal A. fib, restart Xarelto.  Brief Narrative:   Theodore [name] is a 63 y.o. male with medical history significant for steroid-dependent COPD, chronic hypoxic respiratory failure, coronary artery disease, chronic leg swelling, and history of PE on Xarelto, now presenting to the emergency department for evaluation of shortness of breath.  He is found to be hypoxic with labored respirations with chest x-ray notable for chronic hyperinflation, emphysema without acute findings.  Covid-19 was negative.  Patient was originally admitted for underlying COPD exacerbation and treatment with steroids, breathing treatments and antibiotics.  During the hospitalization, patient developed abdominal pain on 09/04/2019 with CT abdomen/pelvis findings of pneumoperitoneum, and he underwent exploratory laparotomy without any significant findings to suggest any intra-abdominal pathology to explain the CAT scan findings.  He was difficult to wean from the ventilator, and was maintained on ventilatory support for 48 hours prior to successful extubation on 09/06/2019.  He continues with significant debility postoperative ileus.  [facility]  Assessment & Plan:   Principal Problem:   COPD with acute exacerbation (HCC) Active Problems:   COPD GOLD IV    COPD exacerbation (HCC)   Chronic respiratory failure assoc with cor pulmonale   CAD (coronary artery disease)   DOE (dyspnea on exertion)   Chronic anticoagulation   Chronic respiratory failure with hypoxia (HCC)   Recurrent pulmonary emboli (HCC)  Hyponatremia   Acute on chronic respiratory failure with hypoxia (HCC)   Pneumoperitoneum   Current chronic use of systemic steroids   Pressure injury of skin   Acute respiratory failure with hypoxia Acute COPD exacerbation Patient presenting to ED with progressive shortness of breath associated with increased respiratory drive likely secondary to his underlying COPD.  Patient 2-3 L oxygen at baseline.  Home medications include Symbicort, Singulair, prednisone 10 mg p.o. daily.  Chest x-ray notable for hyperexpanded lungs without other acute findings.  Hospital course complicated by exploratory laparotomy in which she was difficult to extubate postoperatively, remained on ventilatory support for 48 hours before being extubated on 09/06/2019. Continue supplemental oxygen, continue Brovana and Pulmicort. Taper down the prednisone, continue Lasix  Pneumoperitoneum Postoperative ileus On 09/04/2019, patient developed abdominal pain.  Was noted to have pneumoperitoneum on CT abdomen/pelvis.  Emergently taken to the OR by general surgery and underwent exploratory laparotomy by Dr. Ezzard StandingNewman without any findings to suggest etiology noted on CT scan. Postoperative ileus resolving, had bowel movement on 11/4 AM. On regular consistency diet.  Acute blood loss anemia Drop 13.5 to 11.9 on 10/30; 12.4 today.  Blood oozing at surgical site dressing.  Currently holding anticoagulation. Continue to monitor CBC while he is in the hospital  Paroxysmal atrial fibrillation New onset noted on 09/05/2019.  Likely exacerbated by acute illness.  Currently in normal sinus rhythm. On Xarelto outpatient for history of PE, currently on hold secondary to ABLA/oozing at surgical site. --Continue to monitor on telemetry --metoprolol IV PRN q3h to maintain HR <130  Essential hypertension On Maxide outpatient.  BP 158/78 this morning.  Continues to be n.p.o. secondary to his postoperative ileus; now NG tube back on LWS  --Unable to use oral medications at  this time --Labetalol 10mg  IV prn for SBP >170 or DBP >110 --hydralazine IV prn --Continue to monitor blood pressures closely  Bladder spasms: resolved Likely etiology from clogged Foley catheter, in which Foley was discontinued on 09/07/2019.  Patient reports no further issues.  Hx PE On Xarelto outpatient.  Currently holding anticoagulation due to acute blood loss anemia with oozing at surgical site. --Continue to monitor CBC daily   DVT prophylaxis: SCDs, holding Lovenox secondary to ABLA/oozing from surgical site Code Status: Full code Family Communication: None Disposition Plan: Continue inpatient, SDU level of care; further dependent on clinical course, therapy currently recommending SNF placement.   Consultants:   PCCM  General surgery  Procedures:   Exploratory laparotomy 10/26, Dr. 11/26  Intubation; extubated 09/06/2019  Foley catheter, removed 09/07/2019  Antimicrobials:   Zosyn 10/26 - 10/28  Diflucan 10/26 - 10/28   Objective: Vitals:   09/14/19 2037 09/15/19 0407 09/15/19 0740 09/15/19 0921  BP:  111/76    Pulse:  98 79   Resp:  18 17   Temp:  98 F (36.7 C)    TempSrc:      SpO2: 98% 100% 100% 98%  Weight:      Height:        Intake/Output Summary (Last 24 hours) at 09/15/2019 1002 Last data filed at 09/15/2019 0600 Gross per 24 hour  Intake 966.52 ml  Output 850 ml  Net 116.52 ml   Filed Weights   08/30/19 2331 09/04/19 2316  Weight: 104.3 kg 103 kg    Examination:  General exam: Appears calm and comfortable; appears older than stated age Respiratory system: Coarse breath sounds bilaterally with slightly decreased bases with mild late expiratory wheezing, normal respiratory effort, on Venturi mask Cardiovascular system: S1 & S2 heard, RRR. No JVD, murmurs, rubs, gallops or clicks. No pedal edema. Gastrointestinal system: Abdomen is nondistended, soft and nontender. No organomegaly or masses  felt. Normal bowel sounds heard.  Surgical site noted with dressing in place with slightly blood-tinged dressing Central nervous system: Alert and oriented. No focal neurological deficits. Extremities: Symmetric 5 x 5 power. Skin: No rashes, lesions or ulcers Psychiatry: Judgement and insight appear normal. Mood & affect appropriate.     Data Reviewed: I have personally reviewed following labs and imaging studies  CBC: Recent Labs  Lab 09/10/19 0230 09/11/19 0259 09/12/19 0212 09/13/19 0207 09/14/19 0513  WBC 17.9* 15.1* 15.8* 14.5* 12.7*  HGB 12.4* 11.5* 11.5* 11.1* 11.9*  HCT 40.9 38.3* 38.1* 35.7* 38.0*  MCV 104.1* 106.4* 103.8* 101.4* 99.7  PLT 290 280 300 260 281   Basic Metabolic Panel: Recent Labs  Lab 09/11/19 0259 09/12/19 0212 09/13/19 0207 09/14/19 0513 09/15/19 0505  NA 145 144 139 141 139  K 4.1 3.9 3.9 3.4* 3.8  CL 111 109 104 98 97*  CO2 25 26 28  33* 33*  GLUCOSE 119* 90 93 93 93  BUN 19 23 25* 22 20  CREATININE 0.68 0.88 0.81 0.97 1.03  CALCIUM 8.0* 8.4* 8.2* 8.8* 8.3*  MG 2.1 2.1 2.2 1.9 2.1   GFR: Estimated Creatinine Clearance: 87.8 mL/min (by C-G formula based on SCr of 1.03 mg/dL). Liver Function Tests: No results for input(s): AST, ALT, ALKPHOS, BILITOT, PROT, ALBUMIN in the last 168 hours. No results for input(s): LIPASE, AMYLASE in the last 168 hours. No results for input(s): AMMONIA in the last 168 hours. Coagulation Profile: No results for input(s): INR, PROTIME in the last 168 hours. Cardiac Enzymes: No results for  input(s): CKTOTAL, CKMB, CKMBINDEX, TROPONINI in the last 168 hours. BNP (last 3 results) No results for input(s): PROBNP in the last 8760 hours. HbA1C: No results for input(s): HGBA1C in the last 72 hours. CBG: Recent Labs  Lab 09/12/19 0734 09/13/19 0813 09/13/19 0843 09/14/19 0737 09/15/19 0725  GLUCAP 81 66* 93 96 85   Lipid Profile: No results for input(s): CHOL, HDL, LDLCALC, TRIG, CHOLHDL, LDLDIRECT in the  last 72 hours. Thyroid Function Tests: No results for input(s): TSH, T4TOTAL, FREET4, T3FREE, THYROIDAB in the last 72 hours. Anemia Panel: No results for input(s): VITAMINB12, FOLATE, FERRITIN, TIBC, IRON, RETICCTPCT in the last 72 hours. Sepsis Labs: No results for input(s): PROCALCITON, LATICACIDVEN in the last 168 hours.  No results found for this or any previous visit (from the past 240 hour(s)).       Radiology Studies: No results found.      Scheduled Meds: . arformoterol  15 mcg Nebulization BID  . bisacodyl  10 mg Rectal Daily  . budesonide (PULMICORT) nebulizer solution  0.5 mg Nebulization BID  . chlorhexidine  15 mL Mouth Rinse BID  . Chlorhexidine Gluconate Cloth  6 each Topical Daily  . feeding supplement (ENSURE ENLIVE)  237 mL Oral BID BM  . fluticasone  2 spray Each Nare Daily  . furosemide  40 mg Intravenous BID  . ipratropium-albuterol  3 mL Nebulization TID  . lip balm  1 application Topical BID  . mouth rinse  15 mL Mouth Rinse q12n4p  . metoCLOPramide  5 mg Oral TID AC & HS  . pantoprazole  40 mg Oral BID  . predniSONE  30 mg Oral Q breakfast   Continuous Infusions: . sodium chloride 250 mL (09/14/19 1204)     LOS: 15 days    Time spent: 35 minutes spent on chart review, discussion with nursing staff, consultants, updating family and interview/physical exam; more than 50% of that time was spent in counseling and/or coordination of care.    [place], DO [facility] 09/15/2019, 10:02 AM

## 2019-09-15 NOTE — Progress Notes (Signed)
Physical Therapy Treatment Patient Details Name: Theodore [name] MRN: [id number] DOB: 03-22-1956 Today's Date: 09/15/2019    History of Present Illness 63 y.o. male with medical history significant for steroid-dependent COPD, chronic hypoxic respiratory failure, coronary artery disease, chronic leg swelling, and history of PE on Xarelto, now presenting to the emergency department for evaluation of shortness of breath. Dx: COPD exacerbation. S/P ex lap 10/26 due to pneumoperitoneum. Extubated 10/28.    PT Comments    Pt is making steady progress towards goals. Pt continues to be limited by cardiopulmonary status. Pt requires frequent rest breaks and O2 needed during activity. Pt was c/o SOB with minimal activity. Pt's O2 was 98% on 3L pregait and dropped to 86% 3L at the end of gait. I did monitor O2 sats during ambulation and educated on standing rest breaks and pursed lip breathing. Pt reports he lives alone. Recommend d/c to SNF to focus on increasing endurance with mobility to improve quality of life.  Follow Up Recommendations  SNF;Supervision for mobility/OOB     Equipment Recommendations  None recommended by PT    Recommendations for Other Services       Precautions / Restrictions Precautions Precautions: Fall Precaution Comments: monitored O2 level throughtout session, allowed for rest breaks    Mobility  Bed Mobility Overal bed mobility: Needs Assistance Bed Mobility: Supine to Sit     Supine to sit: Supervision;HOB elevated     General bed mobility comments: use of bed rails  Transfers Overall transfer level: Needs assistance Equipment used: Rolling walker (2 wheeled) Transfers: Sit to/from Stand Sit to Stand: From elevated surface;Min guard Stand pivot transfers: Min guard          Ambulation/Gait Ambulation/Gait assistance: Min guard Gait Distance (Feet): 110 Feet Assistive device: Rolling walker (2 wheeled) Gait Pattern/deviations: Step-through  pattern;Decreased stride length;Trunk flexed Gait velocity: decreased   General Gait Details: standing rest break after 44ft, O2 sats 92% and HR 110 at that time. After completing walk O2 sats 86% and HR 130s. Sat in recliner until vitals normailized in his ranges.   Stairs             Wheelchair Mobility    Modified Rankin (Stroke Patients Only)       Balance Overall balance assessment: Needs assistance Sitting-balance support: No upper extremity supported Sitting balance-Leahy Scale: Good     Standing balance support: Bilateral upper extremity supported Standing balance-Leahy Scale: Fair                              Cognition Arousal/Alertness: Awake/alert Behavior During Therapy: WFL for tasks assessed/performed Overall Cognitive Status: Within Functional Limits for tasks assessed                                 General Comments: Bil LE edema, Brusing to R hip      Exercises General Exercises - Lower Extremity Ankle Circles/Pumps: AROM;Both;10 reps;Supine Quad Sets: AROM;Strengthening;Both;10 reps;Supine Long Arc Quad: AROM;Strengthening;Both;10 reps;Seated Heel Slides: AROM;Strengthening;Both;10 reps;Supine Hip ABduction/ADduction: AROM;Strengthening;Both;10 reps;Supine Straight Leg Raises: AAROM;Strengthening;Both;Supine    General Comments        Pertinent Vitals/Pain Pain Assessment: Faces Faces Pain Scale: Hurts a little bit Pain Location: R hip with exercises Pain Descriptors / Indicators: Discomfort;Grimacing Pain Intervention(s): Limited activity within patient's tolerance;Monitored during session    Home Living  Prior Function            PT Goals (current goals can now be found in the care plan section) Progress towards PT goals: Progressing toward goals    Frequency    Min 3X/week      PT Plan Current plan remains appropriate    Co-evaluation              AM-PAC  PT "6 Clicks" Mobility   Outcome Measure  Help needed turning from your back to your side while in a flat bed without using bedrails?: A Little Help needed moving from lying on your back to sitting on the side of a flat bed without using bedrails?: A Little Help needed moving to and from a bed to a chair (including a wheelchair)?: A Little Help needed standing up from a chair using your arms (e.g., wheelchair or bedside chair)?: A Little Help needed to walk in hospital room?: A Lot Help needed climbing 3-5 steps with a railing? : Total 6 Click Score: 15    End of Session Equipment Utilized During Treatment: Gait belt Activity Tolerance: Patient limited by fatigue Patient left: with call bell/phone within reach;in chair Nurse Communication: Mobility status PT Visit Diagnosis: Difficulty in walking, not elsewhere classified (R26.2);Muscle weakness (generalized) (M62.81)     Time: 9470-9628 PT Time Calculation (min) (ACUTE ONLY): 33 min  Charges:  $Gait Training: 8-22 mins $Therapeutic Exercise: 8-22 mins                     [name], PT   Lelon Mast 09/15/2019, 9:35 AM

## 2019-09-15 NOTE — Progress Notes (Signed)
Occupational Therapy Treatment Patient Details Name: Theodore [name] MRN: [id number] DOB: 08/20/1956 Today's Date: 09/15/2019    History of present illness 63 y.o. male with medical history significant for steroid-dependent COPD, chronic hypoxic respiratory failure, coronary artery disease, chronic leg swelling, and history of PE on Xarelto, now presenting to the emergency department for evaluation of shortness of breath. Dx: COPD exacerbation. S/P ex lap 10/26 due to pneumoperitoneum. Extubated 10/28.   OT comments  Ambulated to bathroom and back without rest break.  HR up to 131 and returned to 64 after rest.    Follow Up Recommendations  SNF    Equipment Recommendations  (doesn't want toilet dme)    Recommendations for Other Services      Precautions / Restrictions Precautions Precautions: Fall Precaution Comments: monitor 02 and HR Restrictions Weight Bearing Restrictions: No       Mobility Bed Mobility Overal bed mobility: Needs Assistance Bed Mobility: Supine to Sit     Supine to sit: Supervision Sit to supine: Supervision   General bed mobility comments: via sidelying with HOB raised  Transfers Overall transfer level: Needs assistance Equipment used: Rolling walker (2 wheeled) Transfers: Sit to/from Stand Sit to Stand: From elevated surface;Min guard Stand pivot transfers: Min guard       General transfer comment: for safety    Balance Overall balance assessment: Needs assistance Sitting-balance support: No upper extremity supported Sitting balance-Leahy Scale: Good     Standing balance support: Bilateral upper extremity supported Standing balance-Leahy Scale: Fair                             ADL either performed or assessed with clinical judgement   ADL                                         General ADL Comments: walked to bathroom and back at min guard level, assist for 02 line. Pt had completed adl earlier.   educated on energy conservation, short bursts of activity while he recovered     Vision       Perception     Praxis      Cognition Arousal/Alertness: Awake/alert Behavior During Therapy: WFL for tasks assessed/performed Overall Cognitive Status: Within Functional Limits for tasks assessed                                 General Comments: Bil LE edema, Brusing to R hip        Exercises General Exercises - Lower Extremity Ankle Circles/Pumps: AROM;Both;10 reps;Supine Quad Sets: AROM;Strengthening;Both;10 reps;Supine Long Arc Quad: AROM;Strengthening;Both;10 reps;Seated Heel Slides: AROM;Strengthening;Both;10 reps;Supine Hip ABduction/ADduction: AROM;Strengthening;Both;10 reps;Supine Straight Leg Raises: AAROM;Strengthening;Both;Supine   Shoulder Instructions       General Comments HR up to 131; sats 96-100% on 3 liters    Pertinent Vitals/ Pain       Pain Assessment: No/denies pain Faces Pain Scale: Hurts a little bit Pain Location: R hip with exercises Pain Descriptors / Indicators: Discomfort;Grimacing Pain Intervention(s): Limited activity within patient's tolerance;Monitored during session  Home Living  Prior Functioning/Environment              Frequency           Progress Toward Goals  OT Goals(current goals can now be found in the care plan section)  Progress towards OT goals: Progressing toward goals     Plan      Co-evaluation                 AM-PAC OT "6 Clicks" Daily Activity     Outcome Measure   Help from another person eating meals?: None Help from another person taking care of personal grooming?: A Little Help from another person toileting, which includes using toliet, bedpan, or urinal?: A Lot Help from another person bathing (including washing, rinsing, drying)?: A Little Help from another person to put on and taking off regular upper body  clothing?: A Little Help from another person to put on and taking off regular lower body clothing?: A Lot 6 Click Score: 17    End of Session  Left in bed with call bell and alarm set      Activity Tolerance Patient tolerated treatment well   Patient Left in chair;with call bell/phone within reach   Nurse Communication          Time: 5400-8676 OT Time Calculation (min): 22 min  Charges: OT General Charges $OT Visit: 1 Visit OT Treatments $Therapeutic Activity: 8-22 mins  Lesle Chris, OTR/L Acute Rehabilitation Services [phone] [facility] pager [phone] office 09/15/2019   [place] 09/15/2019, 1:00 PM

## 2019-09-15 NOTE — Final Consult Note (Signed)
Consultant Final Sign-Off Note    Assessment/Final recommendations  Theodore [name] is a 63 y.o. male followed by me for pneumoperitoneum s/p exploratory laparotomy 09/04/19 by Dr. Ezzard Standing. Patient is POD#11 and tolerating a diet and having bowel function. Clear for discharge from a surgical perspective, follow up arranged. If patient remains in the hospital past 11/11 remove staples here rather than in the office.    Wound care (if applicable): Dry dressing to midline incision daily, may allow soap/water to run over incision to clean. Monitor for signs of infection.    Diet at discharge: per primary team   Activity at discharge: no lifting/pushing/pulling >10 lbs for 8 weeks post-op   Follow-up appointment:  [facility] - in discharge information   Pending results:  Unresulted Labs (From admission, onward)    Start     Ordered   09/14/19 0500  Basic metabolic panel  Daily,   R    Question:  Specimen collection method  Answer:  Lab=Lab collect   09/13/19 1202   09/02/19 0500  Magnesium  Daily,   R     09/01/19 2135           Medication recommendations: may resume home xarelto today (11/6)   Other recommendations: bowel regimen prn for constipation     Thank you for allowing [place] to participate in the care of your patient!  Please consult [place] again if you have further needs for your patient.  Tresa Endo [place] 09/15/2019 8:08 AM    Subjective   Patient is tolerating diet and having bowel function. Had some nausea overnight but this improved with coughing up some phlegm. Pain well controlled.   Objective  Vital signs in last 24 hours: Temp:  [97.5 F (36.4 C)-98 F (36.7 C)] 98 F (36.7 C) (11/06 0407) Pulse Rate:  [79-115] 79 (11/06 0740) Resp:  [17-18] 17 (11/06 0740) BP: (111-114)/(76-79) 111/76 (11/06 0407) SpO2:  [98 %-100 %] 100 % (11/06 0740)  Physical Exam: Gen:  Alert, NAD, pleasant Card:  Regular rate and rhythm Pulm:  Normal effort, diminished bilaterally, junky  sounding cough Abd: Soft, minimally ttp around incision, mildly distended, +BS, ecchymosis of abdominal wall, incision clean and intact with staples present, minimal amount of bloody drainage at inferior incision without surrounding erythema or purulence  Skin: warm and dry, no rashes  Psych: A&Ox3    Pertinent labs and Studies: Recent Labs    09/13/19 0207 09/14/19 0513  WBC 14.5* 12.7*  HGB 11.1* 11.9*  HCT 35.7* 38.0*   BMET Recent Labs    09/14/19 0513 09/15/19 0505  NA 141 139  K 3.4* 3.8  CL 98 97*  CO2 33* 33*  GLUCOSE 93 93  BUN 22 20  CREATININE 0.97 1.03  CALCIUM 8.8* 8.3*   No results for input(s): LABURIN in the last 72 hours. Results for orders placed or performed during the hospital encounter of 08/30/19  SARS Coronavirus 2 by RT PCR (hospital order, performed in [facility] hospital lab) Nasopharyngeal Nasopharyngeal Swab     Status: None   Collection Time: 08/30/19 11:40 PM   Specimen: Nasopharyngeal Swab  Result Value Ref Range Status   SARS Coronavirus 2 NEGATIVE NEGATIVE Final    Comment: (NOTE) If result is NEGATIVE SARS-CoV-2 target nucleic acids are NOT DETECTED. The SARS-CoV-2 RNA is generally detectable in upper and lower  respiratory specimens during the acute phase of infection. The lowest  concentration of SARS-CoV-2 viral copies this assay can detect is 250  copies /  mL. A negative result does not preclude SARS-CoV-2 infection  and should not be used as the sole basis for treatment or other  patient management decisions.  A negative result may occur with  improper specimen collection / handling, submission of specimen other  than nasopharyngeal swab, presence of viral mutation(s) within the  areas targeted by this assay, and inadequate number of viral copies  (<250 copies / mL). A negative result must be combined with clinical  observations, patient history, and epidemiological information. If result is POSITIVE SARS-CoV-2 target nucleic  acids are DETECTED. The SARS-CoV-2 RNA is generally detectable in upper and lower  respiratory specimens dur ing the acute phase of infection.  Positive  results are indicative of active infection with SARS-CoV-2.  Clinical  correlation with patient history and other diagnostic information is  necessary to determine patient infection status.  Positive results do  not rule out bacterial infection or co-infection with other viruses. If result is PRESUMPTIVE POSTIVE SARS-CoV-2 nucleic acids MAY BE PRESENT.   A presumptive positive result was obtained on the submitted specimen  and confirmed on repeat testing.  While 2019 novel coronavirus  (SARS-CoV-2) nucleic acids may be present in the submitted sample  additional confirmatory testing may be necessary for epidemiological  and / or clinical management purposes  to differentiate between  SARS-CoV-2 and other Sarbecovirus currently known to infect humans.  If clinically indicated additional testing with an alternate test  methodology [phone]) is advised. The SARS-CoV-2 RNA is generally  detectable in upper and lower respiratory sp ecimens during the acute  phase of infection. The expected result is Negative. Fact Sheet for Patients:  StrictlyIdeas.no Fact Sheet for Healthcare Providers: BankingDealers.co.za This test is not yet approved or cleared by the [place] FDA and has been authorized for detection and/or diagnosis of SARS-CoV-2 by FDA under an Emergency Use Authorization (EUA).  This EUA will remain in effect (meaning this test can be used) for the duration of the COVID-19 declaration under Section 564(b)(1) of the Act, 21 U.S.C. section 360bbb-3(b)(1), unless the authorization is terminated or revoked sooner. Performed at [facility], [place] [address], [place], [place] [address]   Surgical PCR screen     Status: None   Collection Time: 09/04/19  7:51 PM    Specimen: Nasal Mucosa; Nasal Swab  Result Value Ref Range Status   MRSA, PCR NEGATIVE NEGATIVE Final   Staphylococcus aureus NEGATIVE NEGATIVE Final    Comment: (NOTE) The Xpert SA Assay (FDA approved for NASAL specimens in patients 76 years of age and older), is one component of a comprehensive surveillance program. It is not intended to diagnose infection nor to guide or monitor treatment. Performed at [facility], [place] [address], [place], [place] [address]   MRSA PCR Screening     Status: None   Collection Time: 09/04/19 10:29 PM   Specimen: Nasal Mucosa; Nasopharyngeal  Result Value Ref Range Status   MRSA by PCR NEGATIVE NEGATIVE Final    Comment:        The GeneXpert MRSA Assay (FDA approved for NASAL specimens only), is one component of a comprehensive MRSA colonization surveillance program. It is not intended to diagnose MRSA infection nor to guide or monitor treatment for MRSA infections. Performed at [facility], [place] [address] [place], [place] [address]     Imaging: No results found.

## 2019-09-15 NOTE — TOC Progression Note (Signed)
Transition of Care [facility]) - Progression Note    Patient Details  Name: Theodore [name] MRN: [id number] Date of Birth: [birth year]  [facility]) CM/SW Contact  Mariem Skolnick, Juliann Pulse, RN Phone Number: 09/15/2019, 12:29 PM  Clinical Narrative: Laurell Josephs chosen-awaiting medical stability & 48hrs prior d/c to start auth, & will need covid 48hrs prior d/c-MD updated.      Expected Discharge Plan: [place] Barriers to Discharge: Continued Medical Work up  Expected Discharge Plan and Services Expected Discharge Plan: [place]   Discharge Planning Services: CM Consult   Living arrangements for the past 2 months: Single Family Home                                       Social Determinants of Health (SDOH) Interventions    Readmission Risk Interventions No flowsheet data found.

## 2019-09-16 LAB — BASIC METABOLIC PANEL
Anion gap: 10 (ref 5–15)
BUN: 22 mg/dL (ref 8–23)
CO2: 34 mmol/L — ABNORMAL HIGH (ref 22–32)
Calcium: 8.6 mg/dL — ABNORMAL LOW (ref 8.9–10.3)
Chloride: 96 mmol/L — ABNORMAL LOW (ref 98–111)
Creatinine, Ser: 0.98 mg/dL (ref 0.61–1.24)
GFR calc Af Amer: >60 mL/min (ref >60)
GFR calc non Af Amer: >60 mL/min (ref >60)
Glucose, Bld: 95 mg/dL (ref 70–99)
Potassium: 3.5 mmol/L (ref 3.5–5.1)
Sodium: 140 mmol/L (ref 135–145)

## 2019-09-16 LAB — GLUCOSE, CAPILLARY: Glucose-Capillary: 90 mg/dL (ref 70–99)

## 2019-09-16 LAB — MAGNESIUM: Magnesium: 2 mg/dL (ref 1.7–2.4)

## 2019-09-16 MED ORDER — FUROSEMIDE 10 MG/ML IJ SOLN
40.0000 mg | Freq: Once | INTRAMUSCULAR | Status: AC
  Administered 2019-09-16: 13:00:00 40 mg via INTRAVENOUS
  Filled 2019-09-16 (×2): qty 4

## 2019-09-16 MED ORDER — POTASSIUM CHLORIDE CRYS ER 20 MEQ PO TBCR
40.0000 meq | EXTENDED_RELEASE_TABLET | Freq: Four times a day (QID) | ORAL | Status: AC
  Administered 2019-09-16 (×2): 40 meq via ORAL
  Filled 2019-09-16 (×2): qty 2

## 2019-09-16 MED ORDER — GUAIFENESIN ER 600 MG PO TB12
1200.0000 mg | ORAL_TABLET | Freq: Two times a day (BID) | ORAL | Status: DC
  Administered 2019-09-16 – 2019-09-18 (×5): 1200 mg via ORAL
  Filled 2019-09-16 (×5): qty 2

## 2019-09-16 MED ORDER — METOLAZONE 5 MG PO TABS
5.0000 mg | ORAL_TABLET | Freq: Once | ORAL | Status: AC
  Administered 2019-09-16: 5 mg via ORAL
  Filled 2019-09-16: qty 1

## 2019-09-16 NOTE — Progress Notes (Signed)
PROGRESS NOTE    Theodore Theodore [name]  NAT:[id number] DOB: 1956-05-27 DOA: 08/30/2019 PCP: Biagio Borg, MD   Subjective: Overall feeling much better, denies new complaints. Continue diuresis, lower extremity edema improving. Restarted Xarelto, no evidence of bleeding. Await placement efforts, per CSW note needs insurance authorization likely will be back on Monday  Brief Narrative:   Theodore Theodore [name] Theodore a 63 y.o. Theodore [name] with medical history significant for steroid-dependent COPD, chronic hypoxic respiratory failure, coronary artery disease, chronic leg swelling, and history of PE on Xarelto, now presenting to the emergency department for evaluation of shortness of breath.  He Theodore found to be hypoxic with labored respirations with chest x-ray notable for chronic hyperinflation, emphysema without acute findings.  Covid-19 was negative.  Theodore was originally admitted for underlying COPD exacerbation and treatment with steroids, breathing treatments and antibiotics.  During the hospitalization, Theodore developed abdominal pain on 09/04/2019 with CT abdomen/pelvis findings of pneumoperitoneum, and he underwent exploratory laparotomy without any significant findings to suggest any intra-abdominal pathology to explain the CAT scan findings.  He was difficult to wean from the ventilator, and was maintained on ventilatory support for 48 hours prior to successful extubation on 09/06/2019.  He continues with significant debility postoperative ileus.  Theodore was transferred back to [facility] service from [facility] on 09/08/2019.  Assessment & Plan:   Principal Problem:   COPD with acute exacerbation ([place]) Active Problems:   COPD GOLD IV    COPD exacerbation (HCC)   Chronic respiratory failure assoc with cor pulmonale   CAD (coronary artery disease)   DOE (dyspnea on exertion)   Chronic anticoagulation   Chronic respiratory failure with hypoxia (HCC)   Recurrent pulmonary emboli (HCC)   Hyponatremia   Acute  on chronic respiratory failure with hypoxia (HCC)   Pneumoperitoneum   Current chronic use of systemic steroids   Pressure injury of skin   Acute respiratory failure with hypoxia Acute COPD exacerbation Theodore presenting to ED with progressive shortness of breath associated with increased respiratory drive likely secondary to his underlying COPD.  Theodore Theodore [name].  Home medications include Symbicort, Singulair, prednisone 10 mg p.o. daily.  Chest x-ray notable for hyperexpanded lungs without other acute findings.  Hospital course complicated by exploratory laparotomy in which she was difficult to extubate postoperatively, remained on ventilatory support for 48 hours before being extubated on 09/06/2019. Continue supplemental oxygen, continue Brovana and Pulmicort. Taper down the prednisone, continue Lasix  Pneumoperitoneum Postoperative ileus On 09/04/2019, Theodore developed abdominal pain.  Was noted to have pneumoperitoneum on CT abdomen/pelvis.  Emergently taken to the OR by general surgery and underwent exploratory laparotomy by Dr. Lucia Gaskins without any findings to suggest etiology noted on CT scan. Postoperative ileus resolving, had bowel movement on 11/4 AM. On regular consistency diet.  Acute blood loss anemia Drop 13.5 to 11.9 on 10/30; 12.4 today.  Blood oozing at surgical site dressing.  Currently holding anticoagulation. Continue to monitor CBC while he Theodore in the hospital  Paroxysmal atrial fibrillation New onset noted on 09/05/2019.  Likely exacerbated by acute illness.  Currently in normal sinus rhythm. On Xarelto outpatient for history of PE, currently on hold secondary to ABLA/oozing at surgical site. --Continue to monitor on telemetry --metoprolol IV PRN q3h to maintain HR <130  Essential hypertension On Maxide outpatient.  BP 158/78 this morning.  Continues to be n.p.o. secondary to his postoperative ileus; now NG tube back on LWS --Unable to use oral  medications at this  time --Labetalol 10mg  IV prn for SBP >170 or DBP >110 --hydralazine IV prn --Continue to monitor blood pressures closely  Bladder spasms: resolved Likely etiology from clogged Foley catheter, in which Foley was discontinued on 09/07/2019.  Theodore reports no further issues.  Hx PE On Xarelto outpatient.  Currently holding anticoagulation due to acute blood loss anemia with oozing at surgical site. --Continue to monitor CBC daily   DVT prophylaxis: SCDs, holding Lovenox secondary to ABLA/oozing from surgical site Code Status: Full code Family Communication: None Disposition Plan: Continue inpatient, SDU level of care; further dependent on clinical course, therapy currently recommending SNF placement.   Consultants:   PCCM  General surgery  Procedures:   Exploratory laparotomy 10/26, Dr. Ezzard StandingNewman  Intubation; extubated 09/06/2019  Foley catheter, removed 09/07/2019  Antimicrobials:   Zosyn 10/26 - 10/28  Diflucan 10/26 - 10/28   Objective: Vitals:   09/15/19 1954 09/15/19 2122 09/16/19 0545 09/16/19 0806  BP:  117/65 111/67   Pulse:  (!) 104 100 97  Resp:  18 18 18   Temp:  97.9 F (36.6 C) 98 F (36.7 C)   TempSrc:  Oral Oral   SpO2: 98% 90% 98% 94%  Weight:      Height:        Intake/Output Summary (Last 24 hours) at 09/16/2019 0837 Last data filed at 09/16/2019 0200 Gross per 24 hour  Intake 360 ml  Output 2250 ml  Net -1890 ml   Filed Weights   08/30/19 2331 09/04/19 2316  Weight: 104.3 kg 103 kg    Examination:  General exam: Appears calm and comfortable; appears older than stated age Respiratory system: Coarse breath sounds bilaterally with slightly decreased bases with mild late expiratory wheezing, normal respiratory effort, on Venturi mask Cardiovascular system: S1 & S2 heard, RRR. No JVD, murmurs, rubs, gallops or clicks. No pedal edema. Gastrointestinal system: Abdomen Theodore nondistended, soft and nontender. No organomegaly  or masses felt. Normal bowel sounds heard.  Surgical site noted with dressing in place with slightly blood-tinged dressing Central nervous system: Alert and oriented. No focal neurological deficits. Extremities: Symmetric 5 x 5 power. Skin: No rashes, lesions or ulcers Psychiatry: Judgement and insight appear normal. Mood & affect appropriate.     Data Reviewed: I have personally reviewed following labs and imaging studies  CBC: Recent Labs  Lab 09/10/19 0230 09/11/19 0259 09/12/19 0212 09/13/19 0207 09/14/19 0513  WBC 17.9* 15.1* 15.8* 14.5* 12.7*  HGB 12.4* 11.5* 11.5* 11.1* 11.9*  HCT 40.9 38.3* 38.1* 35.7* 38.0*  MCV 104.1* 106.4* 103.8* 101.4* 99.7  PLT 290 280 300 260 281   Basic Metabolic Panel: Recent Labs  Lab 09/12/19 0212 09/13/19 0207 09/14/19 0513 09/15/19 0505 09/16/19 0442  NA 144 139 141 139 140  K 3.9 3.9 3.4* 3.8 3.5  CL 109 104 98 97* 96*  CO2 26 28 33* 33* 34*  GLUCOSE 90 93 93 93 95  BUN 23 25* 22 20 22   CREATININE 0.88 0.81 0.97 1.03 0.98  CALCIUM 8.4* 8.2* 8.8* 8.3* 8.6*  MG 2.1 2.2 1.9 2.1 2.0   GFR: Estimated Creatinine Clearance: 92.3 mL/min (by C-G formula based on SCr of 0.98 mg/dL). Liver Function Tests: No results for input(s): AST, ALT, ALKPHOS, BILITOT, PROT, ALBUMIN in the last 168 hours. No results for input(s): LIPASE, AMYLASE in the last 168 hours. No results for input(s): AMMONIA in the last 168 hours. Coagulation Profile: No results for input(s): INR, PROTIME in the last 168 hours. Cardiac Enzymes:  No results for input(s): CKTOTAL, CKMB, CKMBINDEX, TROPONINI in the last 168 hours. BNP (last 3 results) No results for input(s): PROBNP in the last 8760 hours. HbA1C: No results for input(s): HGBA1C in the last 72 hours. CBG: Recent Labs  Lab 09/13/19 0813 09/13/19 0843 09/14/19 0737 09/15/19 0725 09/16/19 0828  GLUCAP 66* 93 96 85 90   Lipid Profile: No results for input(s): CHOL, HDL, LDLCALC, TRIG, CHOLHDL,  LDLDIRECT in the last 72 hours. Thyroid Function Tests: No results for input(s): TSH, T4TOTAL, FREET4, T3FREE, THYROIDAB in the last 72 hours. Anemia Panel: No results for input(s): VITAMINB12, FOLATE, FERRITIN, TIBC, IRON, RETICCTPCT in the last 72 hours. Sepsis Labs: No results for input(s): PROCALCITON, LATICACIDVEN in the last 168 hours.  Recent Results (from the past 240 hour(s))  SARS CORONAVIRUS 2 (TAT 6-24 HRS) Nasopharyngeal Nasopharyngeal Swab     Status: None   Collection Time: 09/15/19  8:16 AM   Specimen: Nasopharyngeal Swab  Result Value Ref Range Status   SARS Coronavirus 2 NEGATIVE NEGATIVE Final    Comment: (NOTE) SARS-CoV-2 target nucleic acids are NOT DETECTED. The SARS-CoV-2 RNA Theodore generally detectable in upper and lower respiratory specimens during the acute phase of infection. Negative results do not preclude SARS-CoV-2 infection, do not rule out co-infections with other pathogens, and should not be used as the sole basis for treatment or other Theodore management decisions. Negative results must be combined with clinical observations, Theodore history, and epidemiological information. The expected result Theodore Negative. Fact Sheet for Patients: HairSlick.no Fact Sheet for Healthcare Providers: quierodirigir.com This test Theodore not yet approved or cleared by the [place] FDA and  has been authorized for detection and/or diagnosis of SARS-CoV-2 by FDA under an Emergency Use Authorization (EUA). This EUA will remain  in effect (meaning this test can be used) for the duration of the COVID-19 declaration under Section 56 4(b)(1) of the Act, 21 U.S.C. section 360bbb-3(b)(1), unless the authorization Theodore terminated or revoked sooner. Performed at [facility], [address], [place], [place] [address]          Radiology Studies: No results found.      Scheduled Meds: . arformoterol  15 mcg  Nebulization BID  . bisacodyl  10 mg Rectal Daily  . budesonide (PULMICORT) nebulizer solution  0.5 mg Nebulization BID  . chlorhexidine  15 mL Mouth Rinse BID  . Chlorhexidine Gluconate Cloth  6 each Topical Daily  . feeding supplement (ENSURE ENLIVE)  237 mL Oral BID BM  . fluticasone  2 spray Each Nare Daily  . furosemide  40 mg Intravenous BID  . ipratropium-albuterol  3 mL Nebulization TID  . lip balm  1 application Topical BID  . mouth rinse  15 mL Mouth Rinse q12n4p  . metoCLOPramide  5 mg Oral TID AC & HS  . pantoprazole  40 mg Oral BID  . predniSONE  30 mg Oral Q breakfast  . rivaroxaban  20 mg Oral Q lunch   Continuous Infusions: . sodium chloride 250 mL (09/14/19 1204)     LOS: 16 days    Time spent: 35 minutes spent on chart review, discussion with nursing staff, consultants, updating family and interview/physical exam; more than 50% of that time was spent in counseling and/or coordination of care.    Webber Michiels A Analysse Quinonez, DO [facility] 09/16/2019, 8:37 AM

## 2019-09-17 DIAGNOSIS — J9611 Chronic respiratory failure with hypoxia: Secondary | ICD-10-CM

## 2019-09-17 LAB — MAGNESIUM: Magnesium: 1.9 mg/dL (ref 1.7–2.4)

## 2019-09-17 LAB — BASIC METABOLIC PANEL
Anion gap: 14 (ref 5–15)
BUN: 28 mg/dL — ABNORMAL HIGH (ref 8–23)
CO2: 36 mmol/L — ABNORMAL HIGH (ref 22–32)
Calcium: 9.4 mg/dL (ref 8.9–10.3)
Chloride: 90 mmol/L — ABNORMAL LOW (ref 98–111)
Creatinine, Ser: 1.07 mg/dL (ref 0.61–1.24)
GFR calc Af Amer: >60 mL/min (ref >60)
GFR calc non Af Amer: >60 mL/min (ref >60)
Glucose, Bld: 108 mg/dL — ABNORMAL HIGH (ref 70–99)
Potassium: 3.5 mmol/L (ref 3.5–5.1)
Sodium: 140 mmol/L (ref 135–145)

## 2019-09-17 LAB — GLUCOSE, CAPILLARY: Glucose-Capillary: 92 mg/dL (ref 70–99)

## 2019-09-17 MED ORDER — POTASSIUM CHLORIDE CRYS ER 20 MEQ PO TBCR
40.0000 meq | EXTENDED_RELEASE_TABLET | Freq: Four times a day (QID) | ORAL | Status: AC
  Administered 2019-09-17 (×2): 40 meq via ORAL
  Filled 2019-09-17 (×2): qty 2

## 2019-09-17 NOTE — Progress Notes (Signed)
PROGRESS NOTE    Theodore [name]  GUY:[id number] DOB: [birth year] DOA: 08/30/2019 PCP: Biagio Borg, MD   Subjective: No changes medically overnight. Continue diuresis, continue Xarelto.  Patient is medically ready for discharge since Thursday afternoon, await CSW efforts for placement, currently waiting insurance authorization for case management notes  Brief Narrative:   Theodore [name] is a 63 y.o. male with medical history significant for steroid-dependent COPD, chronic hypoxic respiratory failure, coronary artery disease, chronic leg swelling, and history of PE on Xarelto, now presenting to the emergency department for evaluation of shortness of breath.  He is found to be hypoxic with labored respirations with chest x-ray notable for chronic hyperinflation, emphysema without acute findings.  Covid-19 was negative.  Patient was originally admitted for underlying COPD exacerbation and treatment with steroids, breathing treatments and antibiotics.  During the hospitalization, patient developed abdominal pain on 09/04/2019 with CT abdomen/pelvis findings of pneumoperitoneum, and he underwent exploratory laparotomy without any significant findings to suggest any intra-abdominal pathology to explain the CAT scan findings.  He was difficult to wean from the ventilator, and was maintained on ventilatory support for 48 hours prior to successful extubation on 09/06/2019.  He continues with significant debility postoperative ileus.  Patient was transferred back to [facility] service from [facility] on 09/08/2019.  Assessment & Plan:   Principal Problem:   COPD with acute exacerbation ([place]) Active Problems:   COPD GOLD IV    COPD exacerbation (HCC)   Chronic respiratory failure assoc with cor pulmonale   CAD (coronary artery disease)   DOE (dyspnea on exertion)   Chronic anticoagulation   Chronic respiratory failure with hypoxia (HCC)   Recurrent pulmonary emboli (HCC)   Hyponatremia   Acute on  chronic respiratory failure with hypoxia (HCC)   Pneumoperitoneum   Current chronic use of systemic steroids   Pressure injury of skin   Acute respiratory failure with hypoxia Acute COPD exacerbation Patient presenting to ED with progressive shortness of breath associated with increased respiratory drive likely secondary to his underlying COPD.  Patient 2-3 L oxygen at baseline.  Home medications include Symbicort, Singulair, prednisone 10 mg p.o. daily.  Chest x-ray notable for hyperexpanded lungs without other acute findings.  Hospital course complicated by exploratory laparotomy in which she was difficult to extubate postoperatively, remained on ventilatory support for 48 hours before being extubated on 09/06/2019. Continue supplemental oxygen, continue Brovana and Pulmicort. Taper down the prednisone, continue Lasix  Pneumoperitoneum Postoperative ileus On 09/04/2019, patient developed abdominal pain.  Was noted to have pneumoperitoneum on CT abdomen/pelvis.  Emergently taken to the OR by general surgery and underwent exploratory laparotomy by Dr. Lucia Gaskins without any findings to suggest etiology noted on CT scan. Postoperative ileus resolving, had bowel movement on 11/4 AM. On regular consistency diet.  Acute blood loss anemia Drop 13.5 to 11.9 on 10/30; 12.4 today.  Blood oozing at surgical site dressing.  Currently holding anticoagulation. Continue to monitor CBC while he is in the hospital  Paroxysmal atrial fibrillation New onset noted on 09/05/2019.  Likely exacerbated by acute illness.  Currently in normal sinus rhythm. On Xarelto outpatient for history of PE, currently on hold secondary to ABLA/oozing at surgical site. --Continue to monitor on telemetry --metoprolol IV PRN q3h to maintain HR <130  Essential hypertension On Maxide outpatient.  BP 158/78 this morning.  Continues to be n.p.o. secondary to his postoperative ileus; now NG tube back on LWS --Unable to use oral  medications at this time --Labetalol 10mg   IV prn for SBP >170 or DBP >110 --hydralazine IV prn --Continue to monitor blood pressures closely  Bladder spasms: resolved Likely etiology from clogged Foley catheter, in which Foley was discontinued on 09/07/2019.  Patient reports no further issues.  Hx PE On Xarelto outpatient.  Currently holding anticoagulation due to acute blood loss anemia with oozing at surgical site. --Continue to monitor CBC daily   DVT prophylaxis: SCDs, holding Lovenox secondary to ABLA/oozing from surgical site Code Status: Full code Family Communication: None Disposition Plan: Continue inpatient, SDU level of care; further dependent on clinical course, therapy currently recommending SNF placement.   Consultants:   PCCM  General surgery  Procedures:   Exploratory laparotomy 10/26, Dr. Ezzard StandingNewman  Intubation; extubated 09/06/2019  Foley catheter, removed 09/07/2019  Antimicrobials:   Zosyn 10/26 - 10/28  Diflucan 10/26 - 10/28   Objective: Vitals:   09/16/19 1956 09/16/19 2038 09/17/19 0508 09/17/19 0815  BP:  119/78 111/76   Pulse:  77 87 86  Resp:  20 18 18   Temp:  97.7 F (36.5 C) 98 F (36.7 C)   TempSrc:  Oral    SpO2: 95% 98% 100% 98%  Weight:      Height:        Intake/Output Summary (Last 24 hours) at 09/17/2019 1121 Last data filed at 09/17/2019 0932 Gross per 24 hour  Intake 420 ml  Output 5350 ml  Net -4930 ml   Filed Weights   08/30/19 2331 09/04/19 2316  Weight: 104.3 kg 103 kg    Examination:  General exam: Appears calm and comfortable; appears older than stated age Respiratory system: Coarse breath sounds bilaterally with slightly decreased bases with mild late expiratory wheezing, normal respiratory effort, on Venturi mask Cardiovascular system: S1 & S2 heard, RRR. No JVD, murmurs, rubs, gallops or clicks. No pedal edema. Gastrointestinal system: Abdomen is nondistended, soft and nontender. No organomegaly or masses  felt. Normal bowel sounds heard.  Surgical site noted with dressing in place with slightly blood-tinged dressing Central nervous system: Alert and oriented. No focal neurological deficits. Extremities: Symmetric 5 x 5 power. Skin: No rashes, lesions or ulcers Psychiatry: Judgement and insight appear normal. Mood & affect appropriate.     Data Reviewed: I have personally reviewed following labs and imaging studies  CBC: Recent Labs  Lab 09/11/19 0259 09/12/19 0212 09/13/19 0207 09/14/19 0513  WBC 15.1* 15.8* 14.5* 12.7*  HGB 11.5* 11.5* 11.1* 11.9*  HCT 38.3* 38.1* 35.7* 38.0*  MCV 106.4* 103.8* 101.4* 99.7  PLT 280 300 260 281   Basic Metabolic Panel: Recent Labs  Lab 09/13/19 0207 09/14/19 0513 09/15/19 0505 09/16/19 0442 09/17/19 0506  NA 139 141 139 140 140  K 3.9 3.4* 3.8 3.5 3.5  CL 104 98 97* 96* 90*  CO2 28 33* 33* 34* 36*  GLUCOSE 93 93 93 95 108*  BUN 25* 22 20 22  28*  CREATININE 0.81 0.97 1.03 0.98 1.07  CALCIUM 8.2* 8.8* 8.3* 8.6* 9.4  MG 2.2 1.9 2.1 2.0 1.9   GFR: Estimated Creatinine Clearance: 84.5 mL/min (by C-G formula based on SCr of 1.07 mg/dL). Liver Function Tests: No results for input(s): AST, ALT, ALKPHOS, BILITOT, PROT, ALBUMIN in the last 168 hours. No results for input(s): LIPASE, AMYLASE in the last 168 hours. No results for input(s): AMMONIA in the last 168 hours. Coagulation Profile: No results for input(s): INR, PROTIME in the last 168 hours. Cardiac Enzymes: No results for input(s): CKTOTAL, CKMB, CKMBINDEX, TROPONINI in the last  168 hours. BNP (last 3 results) No results for input(s): PROBNP in the last 8760 hours. HbA1C: No results for input(s): HGBA1C in the last 72 hours. CBG: Recent Labs  Lab 09/13/19 0843 09/14/19 0737 09/15/19 0725 09/16/19 0828 09/17/19 0800  GLUCAP 93 96 85 90 92   Lipid Profile: No results for input(s): CHOL, HDL, LDLCALC, TRIG, CHOLHDL, LDLDIRECT in the last 72 hours. Thyroid Function Tests:  No results for input(s): TSH, T4TOTAL, FREET4, T3FREE, THYROIDAB in the last 72 hours. Anemia Panel: No results for input(s): VITAMINB12, FOLATE, FERRITIN, TIBC, IRON, RETICCTPCT in the last 72 hours. Sepsis Labs: No results for input(s): PROCALCITON, LATICACIDVEN in the last 168 hours.  Recent Results (from the past 240 hour(s))  SARS CORONAVIRUS 2 (TAT 6-24 HRS) Nasopharyngeal Nasopharyngeal Swab     Status: None   Collection Time: 09/15/19  8:16 AM   Specimen: Nasopharyngeal Swab  Result Value Ref Range Status   SARS Coronavirus 2 NEGATIVE NEGATIVE Final    Comment: (NOTE) SARS-CoV-2 target nucleic acids are NOT DETECTED. The SARS-CoV-2 RNA is generally detectable in upper and lower respiratory specimens during the acute phase of infection. Negative results do not preclude SARS-CoV-2 infection, do not rule out co-infections with other pathogens, and should not be used as the sole basis for treatment or other patient management decisions. Negative results must be combined with clinical observations, patient history, and epidemiological information. The expected result is Negative. Fact Sheet for Patients: HairSlick.no Fact Sheet for Healthcare Providers: quierodirigir.com This test is not yet approved or cleared by the [place] FDA and  has been authorized for detection and/or diagnosis of SARS-CoV-2 by FDA under an Emergency Use Authorization (EUA). This EUA will remain  in effect (meaning this test can be used) for the duration of the COVID-19 declaration under Section 56 4(b)(1) of the Act, 21 U.S.C. section 360bbb-3(b)(1), unless the authorization is terminated or revoked sooner. Performed at [facility], [address], [place], [place] [address]          Radiology Studies: No results found.      Scheduled Meds: . arformoterol  15 mcg Nebulization BID  . bisacodyl  10 mg Rectal Daily  .  budesonide (PULMICORT) nebulizer solution  0.5 mg Nebulization BID  . chlorhexidine  15 mL Mouth Rinse BID  . Chlorhexidine Gluconate Cloth  6 each Topical Daily  . feeding supplement (ENSURE ENLIVE)  237 mL Oral BID BM  . fluticasone  2 spray Each Nare Daily  . furosemide  40 mg Intravenous BID  . guaiFENesin  1,200 mg Oral BID  . ipratropium-albuterol  3 mL Nebulization TID  . lip balm  1 application Topical BID  . mouth rinse  15 mL Mouth Rinse q12n4p  . metoCLOPramide  5 mg Oral TID AC & HS  . pantoprazole  40 mg Oral BID  . predniSONE  30 mg Oral Q breakfast  . rivaroxaban  20 mg Oral Q lunch   Continuous Infusions: . sodium chloride 250 mL (09/14/19 1204)     LOS: 17 days    Time spent: 35 minutes spent on chart review, discussion with nursing staff, consultants, updating family and interview/physical exam; more than 50% of that time was spent in counseling and/or coordination of care.    Clerence Gubser A Daley Mooradian, DO [facility] 09/17/2019, 11:21 AM

## 2019-09-18 DIAGNOSIS — I480 Paroxysmal atrial fibrillation: Secondary | ICD-10-CM | POA: Diagnosis not present

## 2019-09-18 DIAGNOSIS — J9621 Acute and chronic respiratory failure with hypoxia: Secondary | ICD-10-CM | POA: Diagnosis not present

## 2019-09-18 DIAGNOSIS — E876 Hypokalemia: Secondary | ICD-10-CM | POA: Diagnosis not present

## 2019-09-18 DIAGNOSIS — R140 Abdominal distension (gaseous): Secondary | ICD-10-CM

## 2019-09-18 DIAGNOSIS — Z9889 Other specified postprocedural states: Secondary | ICD-10-CM | POA: Diagnosis not present

## 2019-09-18 DIAGNOSIS — R05 Cough: Secondary | ICD-10-CM | POA: Diagnosis not present

## 2019-09-18 DIAGNOSIS — D62 Acute posthemorrhagic anemia: Secondary | ICD-10-CM | POA: Diagnosis not present

## 2019-09-18 DIAGNOSIS — J449 Chronic obstructive pulmonary disease, unspecified: Secondary | ICD-10-CM | POA: Diagnosis not present

## 2019-09-18 DIAGNOSIS — K668 Other specified disorders of peritoneum: Secondary | ICD-10-CM | POA: Diagnosis not present

## 2019-09-18 DIAGNOSIS — J9610 Chronic respiratory failure, unspecified whether with hypoxia or hypercapnia: Secondary | ICD-10-CM | POA: Diagnosis not present

## 2019-09-18 DIAGNOSIS — I2609 Other pulmonary embolism with acute cor pulmonale: Secondary | ICD-10-CM | POA: Diagnosis not present

## 2019-09-18 DIAGNOSIS — J441 Chronic obstructive pulmonary disease with (acute) exacerbation: Secondary | ICD-10-CM | POA: Diagnosis not present

## 2019-09-18 DIAGNOSIS — F29 Unspecified psychosis not due to a substance or known physiological condition: Secondary | ICD-10-CM | POA: Diagnosis not present

## 2019-09-18 DIAGNOSIS — D649 Anemia, unspecified: Secondary | ICD-10-CM | POA: Diagnosis not present

## 2019-09-18 DIAGNOSIS — Z86711 Personal history of pulmonary embolism: Secondary | ICD-10-CM | POA: Diagnosis not present

## 2019-09-18 DIAGNOSIS — Z7401 Bed confinement status: Secondary | ICD-10-CM | POA: Diagnosis not present

## 2019-09-18 DIAGNOSIS — J9611 Chronic respiratory failure with hypoxia: Secondary | ICD-10-CM | POA: Diagnosis not present

## 2019-09-18 DIAGNOSIS — I499 Cardiac arrhythmia, unspecified: Secondary | ICD-10-CM | POA: Diagnosis not present

## 2019-09-18 DIAGNOSIS — M2550 Pain in unspecified joint: Secondary | ICD-10-CM | POA: Diagnosis not present

## 2019-09-18 DIAGNOSIS — R0609 Other forms of dyspnea: Secondary | ICD-10-CM | POA: Diagnosis not present

## 2019-09-18 DIAGNOSIS — R2681 Unsteadiness on feet: Secondary | ICD-10-CM | POA: Diagnosis not present

## 2019-09-18 DIAGNOSIS — J9601 Acute respiratory failure with hypoxia: Secondary | ICD-10-CM | POA: Diagnosis not present

## 2019-09-18 DIAGNOSIS — M6281 Muscle weakness (generalized): Secondary | ICD-10-CM | POA: Diagnosis not present

## 2019-09-18 DIAGNOSIS — I10 Essential (primary) hypertension: Secondary | ICD-10-CM | POA: Diagnosis not present

## 2019-09-18 DIAGNOSIS — R41841 Cognitive communication deficit: Secondary | ICD-10-CM | POA: Diagnosis not present

## 2019-09-18 DIAGNOSIS — R52 Pain, unspecified: Secondary | ICD-10-CM | POA: Diagnosis not present

## 2019-09-18 DIAGNOSIS — R5381 Other malaise: Secondary | ICD-10-CM | POA: Diagnosis not present

## 2019-09-18 DIAGNOSIS — I2699 Other pulmonary embolism without acute cor pulmonale: Secondary | ICD-10-CM | POA: Diagnosis not present

## 2019-09-18 DIAGNOSIS — I2510 Atherosclerotic heart disease of native coronary artery without angina pectoris: Secondary | ICD-10-CM | POA: Diagnosis not present

## 2019-09-18 DIAGNOSIS — R262 Difficulty in walking, not elsewhere classified: Secondary | ICD-10-CM | POA: Diagnosis not present

## 2019-09-18 LAB — BASIC METABOLIC PANEL
Anion gap: 13 (ref 5–15)
BUN: 27 mg/dL — ABNORMAL HIGH (ref 8–23)
CO2: 32 mmol/L (ref 22–32)
Calcium: 8.9 mg/dL (ref 8.9–10.3)
Chloride: 91 mmol/L — ABNORMAL LOW (ref 98–111)
Creatinine, Ser: 0.94 mg/dL (ref 0.61–1.24)
GFR calc Af Amer: >60 mL/min (ref >60)
GFR calc non Af Amer: >60 mL/min (ref >60)
Glucose, Bld: 99 mg/dL (ref 70–99)
Potassium: 3.3 mmol/L — ABNORMAL LOW (ref 3.5–5.1)
Sodium: 136 mmol/L (ref 135–145)

## 2019-09-18 LAB — GLUCOSE, CAPILLARY: Glucose-Capillary: 88 mg/dL (ref 70–99)

## 2019-09-18 LAB — MAGNESIUM: Magnesium: 1.8 mg/dL (ref 1.7–2.4)

## 2019-09-18 NOTE — TOC Transition Note (Signed)
Transition of Care [facility]) - CM/SW Discharge Note   Patient Details  Name: Theodore [name] MRN: [id number] Date of Birth: 1956/10/04  Transition of Care [facility]) CM/SW Contact:  Dessa Phi, RN Phone Number: 09/18/2019, 10:08 AM   Clinical Narrative:  Patient has atuh for 7 days from 11/7 [facility]U#[id number] from [facility] rep [place] covid results, & d/c summary. Await rm,bed& tel# for nsg to call report.[facility] for transport.     Final next level of care: Skilled Nursing Facility Barriers to Discharge: No Barriers Identified   Patient Goals and CMS Choice Patient states their goals for this hospitalization and ongoing recovery are:: go to rehab CMS Medicare.gov Compare Post Acute Care list provided to:: Patient Choice offered to / list presented to : Patient  Discharge Placement              Patient chooses bed at: [facility] Patient to be transferred to facility by: [name] Name of family member notified: [name] sister Patient and family notified of of transfer: 09/18/19  Discharge Plan and Services   Discharge Planning Services: CM Consult                                 Social Determinants of Health ([place]) Interventions     Readmission Risk Interventions No flowsheet data found.

## 2019-09-18 NOTE — Care Management Important Message (Signed)
Important Message  Patient Details IM Letter given to Dessa Phi RN to present to the Patient Name: Theodore [name] MRN: [id number] Date of Birth: 13-Mar-1956   Medicare Important Message Given:  Yes     Kerin Salen 09/18/2019, 1:21 PM

## 2019-09-18 NOTE — TOC Progression Note (Signed)
Transition of Care [facility]) - Progression Note    Patient Details  Name: Theodore [name] MRN: [id number] Date of Birth: Oct 06, 1956  Transition of Care [facility]) CM/SW Contact  Dharma Pare, Juliann Pulse, RN Phone Number: 09/18/2019, 11:47 AM  Clinical Narrative:  Faxed w/confirmation auth form for non emergency transport to [facility].Awaiting auth for [facility] from insurance-[facility]     Expected Discharge Plan: [place] Barriers to Discharge: No Barriers Identified  Expected Discharge Plan and Services Expected Discharge Plan: [place]   Discharge Planning Services: CM Consult   Living arrangements for the past 2 months: Single Family Home Expected Discharge Date: 09/18/19                                     Social Determinants of Health (SDOH) Interventions    Readmission Risk Interventions No flowsheet data found.

## 2019-09-18 NOTE — TOC Transition Note (Signed)
Transition of Care [facility]) - CM/SW Discharge Note   Patient Details  Name: Theodore [name] MRN: [id number] Date of Birth: [birth year]  Transition [facility]) CM/SW Contact:  Dessa Phi, RN Phone Number: 09/18/2019, 3:13 PM   Clinical Narrative:   Aida Puffer for Theodore [name] has been called, & given [phone]. Nsg aware of rm#[id number],& tel#for report [phone]. D/c [facility] rm [id number].No further CM needs.    Final next level of care: Skilled Nursing Facility Barriers to Discharge: No Barriers Identified   Patient Goals and CMS Choice Patient states their goals for this hospitalization and ongoing recovery are:: go to rehab CMS Medicare.gov Compare Post Acute Care list provided to:: Patient Choice offered to / list presented to : Patient  Discharge Placement              Patient chooses bed at: [facility] Patient to be transferred to facility by: [place] Name of family member notified: Theodore [name] sister Patient and family notified of of transfer: 09/18/19  Discharge Plan and Services   Discharge Planning Services: CM Consult                                 Social Determinants of Health (Echo) Interventions     Readmission Risk Interventions No flowsheet data found.

## 2019-09-18 NOTE — TOC Progression Note (Signed)
Transition of Care [facility]) - Progression Note    Patient Details  Name: SELIG WAMPOLE MRN: [id number] Date of Birth: 10/28/1956  Transition of Care [facility]) CM/SW Contact  Krishay Faro, Juliann Pulse, RN Phone Number: 09/18/2019, 9:08 AM  Clinical Narrative:  TC HTA rep to f/u on auth request from Friday 11.6-await response for [facility]. COVID neg 11/6.     Expected Discharge Plan: Skilled Nursing Facility Barriers to Discharge: Insurance Authorization  Expected Discharge Plan and Services Expected Discharge Plan: [place]   Discharge Planning Services: CM Consult   Living arrangements for the past 2 months: Single Family Home                                       Social Determinants of Health (SDOH) Interventions    Readmission Risk Interventions No flowsheet data found.

## 2019-09-18 NOTE — Discharge Summary (Signed)
Physician Discharge Summary  Theodore [name] [facility] IRW:[id number] DOB: 1956-03-26 DOA: 08/30/2019  PCP: Biagio Borg, MD  Admit date: 08/30/2019 Discharge date: 09/18/2019  Admitted From: home Disposition:  SNF  Recommendations for Outpatient Follow-up:  1. Follow up with PCP in 1-2 weeks 2. Please obtain BMP/CBC in one week  Home Health: none Equipment/Devices: none  Discharge Condition: stable CODE STATUS: Full code Diet recommendation: regular  HPI: Per admitting MD, Theodore [name] is a 63 y.o. male with medical history significant for steroid-dependent COPD, chronic hypoxic respiratory failure, coronary artery disease, chronic leg swelling, and history of PE on Xarelto, now presenting to the emergency department for evaluation of shortness of breath.  Patient reports increased exertional dyspnea for approximately 2 days, worsened today with increased productive cough and wheezing, and was unable to recover after becoming acutely dyspneic with mild exertion, prompting him to call EMS.  He received albuterol and 125 mg of IV Solu-Medrol prior to arrival in the ED.  He denies any recent fevers or chills.  He denies any chest pain.  Reports continued adherence with his Xarelto.  Reports some improvement with albuterol ED Course: Upon arrival to the ED, patient is found to be tachycardic, with labored respirations, on 6 L/min of supplemental oxygen, and with stable blood pressure.  EKG features sinus tachycardia with rate 112.  Chest x-ray is notable for chronic hyperinflation and emphysema without acute findings.  Chemistry panel is notable for sodium of 133.  CBC features a leukocytosis to 12,300 and a mild thrombocytosis.  Troponin and BNP are normal.  COVID-19 is negative.  Patient was treated with IV magnesium, Atrovent, albuterol, and Rocephin in the ED.  Hospital Course:  Acute respiratory failure with hypoxia Acute COPD exacerbation -Patient presenting to ED with progressive  shortness of breath associated with increased respiratory drive likely secondary to his underlying COPD.  Patient on 2-3 L oxygen at baseline.  Home medications include Symbicort, Singulair, prednisone 10 mg p.o. daily.  Chest x-ray notable for hyperexpanded lungs without other acute findings.  Hospital course complicated by exploratory laparotomy in which she was difficult to extubate postoperatively, remained on ventilatory support for 48 hours before being extubated on 09/06/2019. Respiratory status is stable, back to baseline, no wheezing, will be discharged in stable condition and he is to resume his home medications  Pneumoperitoneum Postoperative ileus -On 09/04/2019, patient developed abdominal pain.  Was noted to have pneumoperitoneum on CT abdomen/pelvis.  Emergently taken to the OR by general surgery and underwent exploratory laparotomy by Dr. Lucia Gaskins without any findings to suggest etiology noted on CT scan. Postoperative ileus resolving, had bowel movement on 11/4 AM. On regular consistency diet.  Acute blood loss anemia -He was have blood oozing at surgical site dressing. Resolved, tolerating anticoagulation without further bleeding   Paroxysmal atrial fibrillation -New onset noted on 09/05/2019.  Likely exacerbated by acute illness.  Currently in normal sinus rhythm. On Xarelto  Essential hypertension -resume home medications on d/c  Bladder spasms: resolved -Likely etiology from clogged Foley catheter, in which Foley was discontinued on 09/07/2019.  Patient reports no further issues.  Hx PE -Continue Xarelto   DVT prophylaxis: SCDs, holding Lovenox secondary to ABLA/oozing from surgical site Code Status: Full code Family Communication: None Disposition Plan: Continue inpatient, SDU level of care; further dependent on clinical course, therapy currently recommending SNF placement.  Discharge Diagnoses:  Principal Problem:   COPD with acute exacerbation ([place]) Active  Problems:   COPD GOLD IV  COPD exacerbation (HCC)   Chronic respiratory failure assoc with cor pulmonale   CAD (coronary artery disease)   DOE (dyspnea on exertion)   Chronic anticoagulation   Chronic respiratory failure with hypoxia (H[facility])   Recurrent pulmonary emboli (HCC)   Hyponatremia   Acute on chronic respiratory failure with hypoxia (HCC)   Pneumoperitoneum   Current chronic use of systemic steroids   Pressure injury of skin  Discharge Instructions  Allergies as of 09/18/2019      Reactions   Naproxen Sodium Itching      Medication List    TAKE these medications   albuterol (2.5 MG/3ML) 0.083% nebulizer solution Commonly known as: PROVENTIL Take 3 mLs (2.5 mg total) by nebulization every 6 (six) hours as needed for wheezing or shortness of breath. What changed: Another medication with the same name was changed. Make sure you understand how and when to take each.   ProAir HFA 108 (90 Base) MCG/ACT inhaler Generic drug: albuterol INHALE 2 PUFFS BY MOUTH EVERY 6 HOURS AS NEEDED FOR WHEEZING OR SHORTNESS OF BREATH What changed:   how much to take  how to take this  when to take this  reasons to take this  additional instructions   budesonide-formoterol 160-4.5 MCG/ACT inhaler Commonly known as: Symbicort INHALE 2 PUFFS FIRST THING IN THE MORNING AND ANOTHER 2 PUFFS ABOUT 12 HOURS LATER What changed:   how much to take  how to take this  when to take this   fluticasone 50 MCG/ACT nasal spray Commonly known as: FLONASE Place 1 spray into both nostrils daily.   guaiFENesin 600 MG 12 hr tablet Commonly known as: MUCINEX Take 2 tablets (1,200 mg total) by mouth 2 (two) times daily.   montelukast 10 MG tablet Commonly known as: SINGULAIR TAKE 1 TABLET(10 MG) BY MOUTH AT BEDTIME What changed: See the new instructions.   MULTI VITAMIN PO Take 1 tablet by mouth daily.   OXYGEN Inhale 2 L/min into the lungs as needed. For sats less than 90%     predniSONE 10 MG tablet Commonly known as: DELTASONE TAKE 1 TABLET BY MOUTH DAILY What changed: when to take this   rivaroxaban 20 MG Tabs tablet Commonly known as: Xarelto TAKE 1 TABLET(20 MG) BY MOUTH EVERY EVENING What changed:   how much to take  how to take this  when to take this  additional instructions   sodium chloride 0.65 % Soln nasal spray Commonly known as: OCEAN Place 1 spray into both nostrils 4 (four) times daily as needed for congestion.   Spiriva HandiHaler 18 MCG inhalation capsule Generic drug: tiotropium INHALE THE CONTENTS OF 1 CAPSULE VIA INHALATION DEVICE EVERY DAY What changed:   how much to take  how to take this  when to take this  additional instructions   triamterene-hydrochlorothiazide 37.5-25 MG tablet Commonly known as: MAXZIDE-25 Take 1 tablet by mouth daily.       Contact information for follow-up providers    Sur[facility], [place]. Go on 09/20/2019.   Specialty: General Surgery Why: Appointment for staple removal scheduled for 9:30 AM. Please arrive 30 min prior to appointment time. Bring photo ID and insurance information.   Contact information: [address] [place] [address] [phone]        Ovidio Kin, MD. Go on 10/19/2019.   Specialty: General Surgery Why: Follow up appointment scheduled for 3:30 PM. Please arrive 15 min prior to appointment time. Bring photo ID and insurance information.  Contact information: [address] [place] [address] [phone]            Contact information for after-discharge care    Destination    [facility] Preferred SNF .   Service: Skilled Nursing Contact information: [address] [place] [address] [phone]                  Consultations:  General surgery   PCCM  Procedures/Studies:  Exploratory laparotomy Sep 11, 2023, Dr. Ezzard Standing  Intubation; extubated 09/06/2019  Foley  catheter, removed 09/07/2019  Ct Abdomen Pelvis W Contrast  Result Date: September 11, 2019 CLINICAL DATA:  New free air by plain radiography. COPD, hypoxia respiratory failure, CHF EXAM: CT ABDOMEN AND PELVIS WITH CONTRAST TECHNIQUE: Multidetector CT imaging of the abdomen and pelvis was performed using the standard protocol following bolus administration of intravenous contrast. CONTRAST:  OMNIPAQUE IOHEXOL 300 MG/ML  SOLN COMPARISON:  None. FINDINGS: Lower chest: Basilar emphysema changes with hyperinflation. Normal heart size. No pericardial or pleural effusion. Aorta atherosclerotic. Hepatobiliary: No focal liver abnormality is seen. No gallstones, gallbladder wall thickening, or biliary dilatation. Pancreas: Unremarkable. No pancreatic ductal dilatation or surrounding inflammatory changes. Spleen: Normal in size without focal abnormality. Adrenals/Urinary Tract: Normal adrenal glands. Kidneys demonstrate parapelvic cysts bilaterally. No renal obstruction or hydronephrosis. No hydroureter or ureteral dilatation. Bladder is partially obscured by the left hip replacement hardware. Stomach/Bowel: Diffuse large volume of pneumoperitoneum throughout the abdomen compatible with perforated viscus. In the left mid abdomen, there 2 loops of dilated abnormal appearing small bowel with fecalization and what appears to be pneumatosis suspicious for ischemia. Stomach and duodenum are underdistended without surrounding inflammatory change or fluid. Moderate colonic stool burden. Normal appendix. Diverticulosis in the sigmoid region without acute inflammatory process or wall thickening. No fluid collection, abscess, hemorrhage, or ascites. Vascular/Lymphatic: Extensive calcific atherosclerosis of the aorta, iliac and abdominal vasculature. Mesenteric and renal vasculature do appear patent proximally. No veno-occlusive process.  No bulky adenopathy. Reproductive: Unremarkable by CT. Other: Very small umbilical hernia  containing fat and free air. No large ventral hernia. No inguinal hernia. Musculoskeletal: Degenerative changes of the spine. Previous left hip arthroplasty. No acute osseous finding. No compression fracture. IMPRESSION: Large volume of abdominopelvic pneumoperitoneum with dilated abnormal appearing small bowel in the left abdomen with fecalization and pneumatosis suspicious for ischemic small bowel. No significant free fluid, hemorrhage or abscess. These results were called by telephone at the time of interpretation on 11-Sep-2019 at 6:53 pm to provider Ovidio Kin, who verbally acknowledged these results. Electronically Signed   By: Judie Petit.  Shick M.D.   On: 09/11/2019 18:56   Dg Chest Port 1 View  Result Date: 09/11/2019 CLINICAL DATA:  Dyspnea EXAM: PORTABLE CHEST 1 VIEW COMPARISON:  09/06/2019 FINDINGS: The heart and mediastinum are normal. Pulmonary hyperinflation and emphysema without acute airspace opacity. Esophagogastric tube with tip below the diaphragm but side-port at the level of the gastroesophageal junction. IMPRESSION: 1. Pulmonary hyperinflation and emphysema without acute airspace opacity. 2. Esophagogastric tube with tip below the diaphragm but side-port at the level of the gastroesophageal junction. Recommend advancement to ensure subdiaphragmatic position. Electronically Signed   By: Lauralyn Primes M.D.   On: 09/11/2019 09:11   Dg Chest Port 1 View  Result Date: 09/06/2019 CLINICAL DATA:  Acute respiratory failure with hypoxia. EXAM: PORTABLE CHEST 1 VIEW COMPARISON:  September 05, 2019. FINDINGS: The heart size and mediastinal contours are within normal limits. Endotracheal and nasogastric tube are  unchanged in position. No pneumothorax or pleural effusion is noted. Right lung is clear. Minimal left basilar subsegmental atelectasis is noted. The visualized skeletal structures are unremarkable. IMPRESSION: Stable support apparatus. Minimal left basilar subsegmental atelectasis. Electronically  Signed   By: Lupita Raider M.D.   On: 09/06/2019 07:55   Dg Chest Port 1 View  Result Date: 09/05/2019 CLINICAL DATA:  Endotracheal tube position. EXAM: PORTABLE CHEST 1 VIEW COMPARISON:  August 30, 2019. FINDINGS: The heart size and mediastinal contours are within normal limits. Endotracheal and nasogastric tubes are in grossly good position. No pneumothorax is noted. No acute pulmonary disease is noted. The visualized skeletal structures are unremarkable. IMPRESSION: Endotracheal and nasogastric tubes are in grossly good position. No acute cardiopulmonary abnormality seen. Electronically Signed   By: Lupita Raider M.D.   On: 09/05/2019 07:53   Dg Chest Portable 1 View  Result Date: 08/31/2019 CLINICAL DATA:  Shortness of breath. Cough. Wheezing. COPD. EXAM: PORTABLE CHEST 1 VIEW COMPARISON:  Radiograph 07/17/2017, CT 12/03/2016 FINDINGS: Chronic hyperinflation and emphysema. Unchanged heart size and mediastinal contours. No acute airspace disease, pulmonary edema, large pleural effusion or pneumothorax. There is biapical pleuroparenchymal scarring. No acute osseous abnormalities. IMPRESSION: Chronic hyperinflation and emphysema. No superimposed acute abnormality. Electronically Signed   By: Narda Rutherford M.D.   On: 08/31/2019 00:15   Dg Abd Acute 2+v W 1v Chest  Addendum Date: 09/04/2019   ADDENDUM REPORT: 09/04/2019 16:14 ADDENDUM: These results were called by telephone at the time of interpretation on 09/04/2019 at 4:14 pm to provider [facility] PATEL , who verbally acknowledged these results. Electronically Signed   By: Katherine Mantle M.D.   On: 09/04/2019 16:14   Result Date: 09/04/2019 CLINICAL DATA:  Shortness of breath EXAM: DG ABDOMEN ACUTE W/ 1V CHEST COMPARISON:  September 02, 2019 FINDINGS: The lungs are hyperexpanded. There is no pneumothorax. Emphysematous changes are noted. There is an airspace opacity in the right upper lobe. Pleuroparenchymal scarring is noted the lung  apices. Heart size is stable. Scarring versus atelectasis is noted at the lung bases. Evaluation of the abdomen demonstrates pneumoperitoneum. There are dilated loops of small bowel measuring up to approximately 5 cm. There is a large amount of stool in the right hemicolon. The patient is status post total hip arthroplasty on the left. Degenerative changes are noted throughout the lumbar spine. IMPRESSION: 1. Moderate volume pneumoperitoneum concerning for hollow viscus perforation. 2. Dilated loops of small bowel concerning for small bowel obstruction. 3. Right upper lobe airspace opacity. This could represent atelectasis or developing infiltrate. A pulmonary nodule is not entirely excluded. A 4-6 week follow-up two-view chest x-ray is recommended to confirm resolution of this finding. 4. Large amount of stool in the right hemicolon. Electronically Signed: By: Katherine Mantle M.D. On: 09/04/2019 16:05   Dg Abd Portable 1v  Result Date: 09/09/2019 CLINICAL DATA:  Abdominal distension. Status post exploratory laparotomy on 09/04/2019. EXAM: PORTABLE ABDOMEN - 1 VIEW COMPARISON:  09/06/2019 and abdomen pelvis CT dated 09/04/2019. FINDINGS: Multiple dilated small bowel loops with progression. Interval skin clips. There is stool and gas in normal caliber colon. Left hip prosthesis. Mild right hip degenerative changes. Probable distal nasogastric tube in the proximal stomach. IMPRESSION: 1. Progressive small bowel dilatation, most likely representing postoperative ileus. Obstruction is also possible. 2. Probable distal nasogastric tube in the proximal stomach. Electronically Signed   By: Beckie Salts M.D.   On: 09/09/2019 06:24   Dg Abd Portable 1v  Result Date:  09/06/2019 CLINICAL DATA:  Nasogastric tube placement. EXAM: PORTABLE ABDOMEN - 1 VIEW COMPARISON:  September 04, 2019. FINDINGS: The bowel gas pattern is normal. Distal tip of nasogastric tube is seen in the stomach. No radio-opaque calculi or other  significant radiographic abnormality are seen. IMPRESSION: Distal tip of nasogastric tube seen in the stomach. Electronically Signed   By: Lupita Raider M.D.   On: 09/06/2019 10:25   Dg Abd Portable 1v  Result Date: 09/02/2019 CLINICAL DATA:  Brief episode of heartburn with nausea and vomiting yesterday. EXAM: PORTABLE ABDOMEN - 1 VIEW COMPARISON:  Abdominal x-ray dated July 18, 2017. FINDINGS: The bowel gas pattern is normal. Moderate amount of stool in the right colon. No radio-opaque calculi or other significant radiographic abnormality are seen. No acute osseous abnormality. Prior left hip total arthroplasty. IMPRESSION: No acute findings. Electronically Signed   By: Obie Dredge M.D.   On: 09/02/2019 13:17      Subjective: - no chest pain, shortness of breath, no abdominal pain, nausea or vomiting.   Discharge Exam: BP 110/86 (BP Location: Left Arm)    Pulse 81    Temp 97.9 F (36.6 C) (Oral)    Resp 16    Ht  (1.93 m)    Wt 103 kg    SpO2 98%    BMI 27.64 kg/m   General: Pt is alert, awake, not in acute distress Cardiovascular: RRR, S1/S2 +, no rubs, no gallops Respiratory: CTA bilaterally, no wheezing, no rhonchi Abdominal: Soft, NT, ND, bowel sounds + Extremities: no edema, no cyanosis    The results of significant diagnostics from this hospitalization (including imaging, microbiology, ancillary and laboratory) are listed below for reference.     Microbiology: Recent Results (from the past 240 hour(s))  SARS CORONAVIRUS 2 (TAT 6-24 HRS) Nasopharyngeal Nasopharyngeal Swab     Status: None   Collection Time: 09/15/19  8:16 AM   Specimen: Nasopharyngeal Swab  Result Value Ref Range Status   SARS Coronavirus 2 NEGATIVE NEGATIVE Final    Comment: (NOTE) SARS-CoV-2 target nucleic acids are NOT DETECTED. The SARS-CoV-2 RNA is generally detectable in upper and lower respiratory specimens during the acute phase of infection. Negative results do not preclude  SARS-CoV-2 infection, do not rule out co-infections with other pathogens, and should not be used as the sole basis for treatment or other patient management decisions. Negative results must be combined with clinical observations, patient history, and epidemiological information. The expected result is Negative. Fact Sheet for Patients: HairSlick.no Fact Sheet for Healthcare Providers: quierodirigir.com This test is not yet approved or cleared by the [place] FDA and  has been authorized for detection and/or diagnosis of SARS-CoV-2 by FDA under an Emergency Use Authorization (EUA). This EUA will remain  in effect (meaning this test can be used) for the duration of the COVID-19 declaration under Section 56 4(b)(1) of the Act, 21 U.S.C. section 360bbb-3(b)(1), unless the authorization is terminated or revoked sooner. Performed at [facility], [address], [place], [place] [address]      Labs: BNP (last 3 results) Recent Labs    08/30/19 2339  BNP 44.0   Basic Metabolic Panel: Recent Labs  Lab 09/14/19 0513 09/15/19 0505 09/16/19 0442 09/17/19 0506 09/18/19 0432  NA 141 139 140 140 136  K 3.4* 3.8 3.5 3.5 3.3*  CL 98 97* 96* 90* 91*  CO2 33* 33* 34* 36* 32  GLUCOSE 93 93 95 108* 99  BUN 22 20  22 28* 27*  CREATININE 0.97 1.03 0.98 1.07 0.94  CALCIUM 8.8* 8.3* 8.6* 9.4 8.9  MG 1.9 2.1 2.0 1.9 1.8   Liver Function Tests: No results for input(s): AST, ALT, ALKPHOS, BILITOT, PROT, ALBUMIN in the last 168 hours. No results for input(s): LIPASE, AMYLASE in the last 168 hours. No results for input(s): AMMONIA in the last 168 hours. CBC: Recent Labs  Lab 09/12/19 0212 09/13/19 0207 09/14/19 0513  WBC 15.8* 14.5* 12.7*  HGB 11.5* 11.1* 11.9*  HCT 38.1* 35.7* 38.0*  MCV 103.8* 101.4* 99.7  PLT 300 260 281   Cardiac Enzymes: No results for input(s): CKTOTAL, CKMB, CKMBINDEX, TROPONINI in the last 168  hours. BNP: Invalid input(s): POCBNP CBG: Recent Labs  Lab 09/14/19 0737 09/15/19 0725 09/16/19 0828 09/17/19 0800 09/18/19 0741  GLUCAP 96 85 90 92 88   D-Dimer No results for input(s): DDIMER in the last 72 hours. Hgb A1c No results for input(s): HGBA1C in the last 72 hours. Lipid Profile No results for input(s): CHOL, HDL, LDLCALC, TRIG, CHOLHDL, LDLDIRECT in the last 72 hours. Thyroid function studies No results for input(s): TSH, T4TOTAL, T3FREE, THYROIDAB in the last 72 hours.  Invalid input(s): FREET3 Anemia work up No results for input(s): VITAMINB12, FOLATE, FERRITIN, TIBC, IRON, RETICCTPCT in the last 72 hours. Urinalysis    Component Value Date/Time   COLORURINE YELLOW 05/17/2008 2303   APPEARANCEUR CLEAR 05/17/2008 2303   LABSPEC 1.024 05/17/2008 2303   PHURINE 6.5 05/17/2008 2303   GLUCOSEU NEGATIVE 05/17/2008 2303   HGBUR TRACE (A) 05/17/2008 2303   BILIRUBINUR NEGATIVE 05/17/2008 2303   KETONESUR NEGATIVE 05/17/2008 2303   PROTEINUR NEGATIVE 05/17/2008 2303   UROBILINOGEN 1.0 05/17/2008 2303   NITRITE NEGATIVE 05/17/2008 2303   LEUKOCYTESUR NEGATIVE 05/17/2008 2303   Sepsis Labs Invalid input(s): PROCALCITONIN,  WBC,  LACTICIDVEN  FURTHER DISCHARGE INSTRUCTIONS:   Get Medicines reviewed and adjusted: Please take all your medications with you for your next visit with your Primary MD   Laboratory/radiological data: Please request your Primary MD to go over all hospital tests and procedure/radiological results at the follow up, please ask your Primary MD to get all Hospital records sent to his/her office.   In some cases, they will be blood work, cultures and biopsy results pending at the time of your discharge. Please request that your primary care M.D. goes through all the records of your hospital data and follows up on these results.   Also Note the following: If you experience worsening of your admission symptoms, develop shortness of breath,  life threatening emergency, suicidal or homicidal thoughts you must seek medical attention immediately by calling 911 or calling your MD immediately  if symptoms less severe.   You must read complete instructions/literature along with all the possible adverse reactions/side effects for all the Medicines you take and that have been prescribed to you. Take any new Medicines after you have completely understood and accpet all the possible adverse reactions/side effects.    Do not drive when taking Pain medications or sleeping medications (Benzodaizepines)   Do not take more than prescribed Pain, Sleep and Anxiety Medications. It is not advisable to combine anxiety,sleep and pain medications without talking with your primary care practitioner   Special Instructions: If you have smoked or chewed Tobacco  in the last 2 yrs please stop smoking, stop any regular Alcohol  and or any Recreational drug use.   Wear Seat belts while driving.   Please note: You were cared  for by a hospitalist during your hospital stay. Once you are discharged, your primary care physician will handle any further medical issues. Please note that NO REFILLS for any discharge medications will be authorized once you are discharged, as it is imperative that you return to your primary care physician (or establish a relationship with a primary care physician if you do not have one) for your post hospital discharge needs so that they can reassess your need for medications and monitor your lab values.  Time coordinating discharge: 40 minutes  SIGNED:  Pamella Pert, MD, PhD 09/18/2019, 9:44 AM

## 2019-09-18 NOTE — Progress Notes (Signed)
Patient discharging to [facility]  - report called and given to [place] at facility.  All questions answered.  Patient aware of plan, AVS placed in packet to be transported to facility.  Patient in NAD  -waiting arrival of transport.

## 2019-09-19 ENCOUNTER — Telehealth: Payer: Self-pay | Admitting: *Deleted

## 2019-09-19 ENCOUNTER — Encounter: Payer: Self-pay | Admitting: Internal Medicine

## 2019-09-19 ENCOUNTER — Non-Acute Institutional Stay (SKILLED_NURSING_FACILITY): Payer: PPO | Admitting: Internal Medicine

## 2019-09-19 DIAGNOSIS — I10 Essential (primary) hypertension: Secondary | ICD-10-CM | POA: Diagnosis not present

## 2019-09-19 DIAGNOSIS — E876 Hypokalemia: Secondary | ICD-10-CM | POA: Diagnosis not present

## 2019-09-19 DIAGNOSIS — K668 Other specified disorders of peritoneum: Secondary | ICD-10-CM | POA: Diagnosis not present

## 2019-09-19 DIAGNOSIS — J441 Chronic obstructive pulmonary disease with (acute) exacerbation: Secondary | ICD-10-CM | POA: Diagnosis not present

## 2019-09-19 DIAGNOSIS — Z86711 Personal history of pulmonary embolism: Secondary | ICD-10-CM | POA: Diagnosis not present

## 2019-09-19 NOTE — Progress Notes (Signed)
Location:  [place] Room Number: 008-Q Place of Service:  SNF [phone]) Provider:  Luan Moore, MD  Patient Care Team: Biagio Borg, MD as PCP - General (Internal Medicine) Wardell Honour, MD as Referring Physician (Family Medicine) Loletha Carrow Kirke Corin, MD as Consulting Physician (Gastroenterology) Juanito Doom, MD as Consulting Physician (Pulmonary Disease)  Extended Emergency Contact Information Primary Emergency Contact: Maeola Sarah of [place] Phone: [phone] Work Phone: [phone] Mobile Phone: [phone] Relation: Sister Secondary Emergency Contact: Lynnae January States of [place] Mobile Phone: [phone] Relation: Sister Mother: Tejay, Hubert States of [place] Phone: [phone]  Code Status:  Full Code Goals of care: Advanced Directive information Advanced Directives 08/30/2019  Does Patient Have a Medical Advance Directive? Yes  Type of Paramedic of [place];Living will  Does patient want to make changes to medical advance directive? Yes (ED - Information included in AVS)  Copy of [place] in Chart? Yes - validated most recent copy scanned in chart (See row information)  Would patient like information on creating a medical advance directive? Yes (ED - Information included in AVS)  Pre-existing out of facility DNR order (yellow form or pink MOST form) -    Chief complaint acute visit status post hospitalization for COPD exacerbation   HPI:  Pt is a 63 y.o. male seen today for an acute visit after hospitalization for COPD exacerbation.  Patient has a history of steroid-dependent COPD with chronic respiratory failure as well as coronary artery disease chronic lower extremity edema and history of pulmonary embolism on chronic Xarelto.  Presented to the ER with increased shortness of breath and productive cough  and wheezing.  He actually had called EMS secondary to acute shortness of breath.  He received albuterol and steroids prior to the ED.  Chest x-ray did show chronic hyperinflation with emphysema without acute findings.  He had leukocytosis of 12,300 and mild thrombocytosis.  He was treated with IV magnesium Atrovent albuterol and Rocephin in the ED  He also had a exploratory laparotomy and had a difficult extubation.  Apparently he did return back to his baseline.  Exploratory laparotomy was done apparently because of abdominal pain he was noted to have a pneumoperitonem on CT scan and emergently was taken to the OR by general surgery and underwent a laparotomy without any findings to suggest etiology noted on the CT scan.  He did have an ileus that apparently resolved.  He also had acute blood loss anemia t\with some oozing at his surgical site this resolved as well.  He also had what was thought to be transitory A. fib thought secondary to his acute illness and it did return to normal sinus rhythm.  He also had bladder spasms which were thought related to his Foley catheter and these resolved as well\   Currently he has no complaints says his breathing has improved he is sitting in his chair comfortably vital signs appear to be stable.             Past Medical History:  Diagnosis Date   Anxiety    Arthritis    "back" (01/24/2014)   Asthma    Chronic bronchitis ([place])    Cluster headache    "last bout was summer 2014; had them q spring 1991-2001" (01/24/2014)   COPD (chronic obstructive pulmonary disease) ([place])    GERD (  gastroesophageal reflux disease)    NSTEMI (non-ST elevated myocardial infarction) (HCC) 01/2014   On home oxygen therapy    "2L; 24/7" (01/24/2014)   Pneumonia 06/2013; 01/2014   Pulmonary embolism (HCC) 01/23/2014   Seasonal allergies    Past Surgical History:  Procedure Laterality Date   COLONOSCOPY WITH PROPOFOL N/A 10/20/2016    Procedure: COLONOSCOPY WITH PROPOFOL;  Surgeon: Sherrilyn Rist, MD;  Location: [facility];  Service: Gastroenterology;  Laterality: N/A;   LAPAROTOMY N/A 09/04/2019   Procedure: EXPLORATORY LAPAROTOMY;  Surgeon: Ovidio Kin, MD;  Location: [facility] ORS;  Service: General;  Laterality: N/A;   NASAL SEPTOPLASTY W/ TURBINOPLASTY Left 1983   TOTAL HIP ARTHROPLASTY Left 07/16/2017   Procedure: TOTAL HIP ARTHROPLASTY ANTERIOR APPROACH;  Surgeon: Tarry Kos, MD;  Location: [facility] OR;  Service: Orthopedics;  Laterality: Left;   TURBINATE REDUCTION Bilateral ~ 1983    Allergies  Allergen Reactions   Naproxen Sodium Itching    Outpatient Encounter Medications as of 09/19/2019  Medication Sig   albuterol (PROVENTIL) (2.5 MG/3ML) 0.083% nebulizer solution Take 3 mLs (2.5 mg total) by nebulization every 6 (six) hours as needed for wheezing or shortness of breath.   budesonide-formoterol (SYMBICORT) 160-4.5 MCG/ACT inhaler INHALE 2 PUFFS FIRST THING IN THE MORNING AND ANOTHER 2 PUFFS ABOUT 12 HOURS LATER (Patient taking differently: Inhale 2 puffs into the lungs See admin instructions. INHALE 2 PUFFS FIRST THING IN THE MORNING AND ANOTHER 2 PUFFS ABOUT 12 HOURS LATER)   fluticasone (FLONASE) 50 MCG/ACT nasal spray Place 1 spray into both nostrils daily.   guaiFENesin (MUCINEX) 600 MG 12 hr tablet Take 2 tablets (1,200 mg total) by mouth 2 (two) times daily.   montelukast (SINGULAIR) 10 MG tablet TAKE 1 TABLET(10 MG) BY MOUTH AT BEDTIME (Patient taking differently: Take 10 mg by mouth at bedtime. )   Multiple Vitamin (MULTI VITAMIN PO) Take 1 tablet by mouth daily.   OXYGEN Inhale 2 L/min into the lungs as needed. For sats less than 90%   predniSONE (DELTASONE) 10 MG tablet TAKE 1 TABLET BY MOUTH DAILY (Patient taking differently: Take 10 mg by mouth daily with breakfast. )   PROAIR HFA 108 (90 Base) MCG/ACT inhaler INHALE 2 PUFFS BY MOUTH EVERY 6 HOURS AS NEEDED FOR WHEEZING OR SHORTNESS OF  BREATH (Patient taking differently: Inhale 2 puffs into the lungs every 6 (six) hours as needed for wheezing or shortness of breath. )   rivaroxaban (XARELTO) 20 MG TABS tablet TAKE 1 TABLET(20 MG) BY MOUTH EVERY EVENING (Patient taking differently: Take 20 mg by mouth daily. )   sodium chloride (OCEAN) 0.65 % SOLN nasal spray Place 1 spray into both nostrils 4 (four) times daily as needed for congestion.   tiotropium (SPIRIVA HANDIHALER) 18 MCG inhalation capsule INHALE THE CONTENTS OF 1 CAPSULE VIA INHALATION DEVICE EVERY DAY (Patient taking differently: Place 18 mcg into inhaler and inhale daily. )   triamterene-hydrochlorothiazide (MAXZIDE-25) 37.5-25 MG tablet Take 1 tablet by mouth daily.   No facility-administered encounter medications on file as of 09/19/2019.     Review of Systems   General is not complaining of any fever or chills.  Skin does not complain of rashes or itching surgical site has some bruising but I do not see active bleeding.  Head ears eyes nose mouth and throat is not complain of visual changes or sore throat.  Respiratory is not complaining at this time of shortness breath or cough he is on chronic oxygen.  Cardiac does not complain of chest pain.  GI does not complain of abdominal pain nausea vomiting diarrhea or constipation at this time.  GU does not complain of dysuria.  Musculoskeletal at this point is not complaining of joint pain or swelling.  Neurologic does not complain of dizziness headache syncope or numbness.  And psych does not complain of being depressed or anxious appears to be in good spirits.    Immunization History  Administered Date(s) Administered   Fluad Quad(high Dose 65+) 07/05/2019   Influenza Split 08/10/2011, 07/27/2012   Influenza,inj,Quad PF,6+ Mos 07/14/2013, 08/06/2014, 10/17/2015, 07/21/2016, 08/26/2017, 07/28/2018   PPD Test 07/22/2017, 07/29/2017   Pneumococcal Conjugate-13 08/06/2014   Pneumococcal  Polysaccharide-23 02/05/2012   Tdap 09/03/2014, 07/14/2017   Pertinent  Health Maintenance Due  Topic Date Due   HEMOGLOBIN A1C  02/17/2019   PNA vac Low Risk Adult (2 of 2 - PPSV23) 04/22/2019   FOOT EXAM  08/19/2019   OPHTHALMOLOGY EXAM  11/10/2019   COLONOSCOPY  10/20/2026   INFLUENZA VACCINE  Completed   Fall Risk  08/23/2019 04/12/2019 08/18/2018 01/25/2017 04/30/2016  Falls in the past year? 0 0 No No No  Number falls in past yr: 0 - - - -  Injury with Fall? 0 - - - -  Risk for fall due to : History of fall(s) - - - -  Follow up Falls prevention discussed - - - -   Functional Status Survey:    Temperature is 97.4 pulse 88 respirations 18 blood pressure 135/70 oxygen saturation is 95% on chronic oxygen  Physical Exam   General this is a pleasant male in no distress sitting comfortably in his chair.  His skin is warm and dry.  Eyes visual acuity appears to be intact sclera and conjunctive are clear he has prescription lenses.  Oropharynx clear mucous membranes moist.  Chest he has decreased breath sounds with a small amount of diffuse rhonchi there is no labored breathing.  Heart is somewhat distant heart sounds regular rate and rhythm with an occasional irregular beat without murmur gallop or rub.-He he does have some moderate lower extremity edema apparently there is chronicity to this  Abdomen is soft nontender with positive bowel sounds surgical site appears fairly benign without signs of infection there is some bruising on the inferior aspect staples are in place.  Musculoskeletal is able to move all extremities x4 with some deconditioning here lower extremities.  Neurologic appears grossly intact speech is clear cannot really appreciate lateralizing findings.  Psych he is alert and oriented pleasant and appropriate.    Labs reviewed: Recent Labs    09/16/19 0442 09/17/19 0506 09/18/19 0432  NA 140 140 136  K 3.5 3.5 3.3*  CL 96* 90* 91*  CO2 34*  36* 32  GLUCOSE 95 108* 99  BUN 22 28* 27*  CREATININE 0.98 1.07 0.94  CALCIUM 8.6* 9.4 8.9  MG 2.0 1.9 1.8   Recent Labs    07/05/19 1107 08/31/19 0321 09/01/19 0309  AST 16 19 21   ALT 14 17 15   ALKPHOS 63 54 47  BILITOT 0.5 0.6 0.8  PROT 6.9 6.7 6.2*  ALBUMIN 4.1 3.7 3.3*   Recent Labs    08/30/19 2339  08/31/19 0321 09/01/19 0309  09/12/19 0212 09/13/19 0207 09/14/19 0513  WBC 12.3*  --  14.8* 16.4*   < > 15.8* 14.5* 12.7*  NEUTROABS 9.1*  --  13.9* 14.9*  --   --   --   --  HGB 14.2   < > 14.1 12.7*   < > 11.5* 11.1* 11.9*  HCT 43.6   < > 44.1 38.9*   < > 38.1* 35.7* 38.0*  MCV 95.4  --  96.7 97.0   < > 103.8* 101.4* 99.7  PLT 408*  --  351 357   < > 300 260 281   < > = values in this interval not displayed.   Lab Results  Component Value Date   TSH 1.20 09/03/2014   Lab Results  Component Value Date   HGBA1C 5.6 08/18/2018   Lab Results  Component Value Date   CHOL 215 (H) 08/18/2018   HDL 68.10 08/18/2018   LDLCALC 126 (H) 08/18/2018   TRIG 127 09/04/2019   CHOLHDL 3 08/18/2018    Significant Diagnostic Results in last 30 days:  Ct Abdomen Pelvis W Contrast  Result Date: 09/04/2019 CLINICAL DATA:  New free air by plain radiography. COPD, hypoxia respiratory failure, CHF EXAM: CT ABDOMEN AND PELVIS WITH CONTRAST TECHNIQUE: Multidetector CT imaging of the abdomen and pelvis was performed using the standard protocol following bolus administration of intravenous contrast. CONTRAST:  OMNIPAQUE IOHEXOL 300 MG/ML  SOLN COMPARISON:  None. FINDINGS: Lower chest: Basilar emphysema changes with hyperinflation. Normal heart size. No pericardial or pleural effusion. Aorta atherosclerotic. Hepatobiliary: No focal liver abnormality is seen. No gallstones, gallbladder wall thickening, or biliary dilatation. Pancreas: Unremarkable. No pancreatic ductal dilatation or surrounding inflammatory changes. Spleen: Normal in size without focal abnormality. Adrenals/Urinary  Tract: Normal adrenal glands. Kidneys demonstrate parapelvic cysts bilaterally. No renal obstruction or hydronephrosis. No hydroureter or ureteral dilatation. Bladder is partially obscured by the left hip replacement hardware. Stomach/Bowel: Diffuse large volume of pneumoperitoneum throughout the abdomen compatible with perforated viscus. In the left mid abdomen, there 2 loops of dilated abnormal appearing small bowel with fecalization and what appears to be pneumatosis suspicious for ischemia. Stomach and duodenum are underdistended without surrounding inflammatory change or fluid. Moderate colonic stool burden. Normal appendix. Diverticulosis in the sigmoid region without acute inflammatory process or wall thickening. No fluid collection, abscess, hemorrhage, or ascites. Vascular/Lymphatic: Extensive calcific atherosclerosis of the aorta, iliac and abdominal vasculature. Mesenteric and renal vasculature do appear patent proximally. No veno-occlusive process.  No bulky adenopathy. Reproductive: Unremarkable by CT. Other: Very small umbilical hernia containing fat and free air. No large ventral hernia. No inguinal hernia. Musculoskeletal: Degenerative changes of the spine. Previous left hip arthroplasty. No acute osseous finding. No compression fracture. IMPRESSION: Large volume of abdominopelvic pneumoperitoneum with dilated abnormal appearing small bowel in the left abdomen with fecalization and pneumatosis suspicious for ischemic small bowel. No significant free fluid, hemorrhage or abscess. These results were called by telephone at the time of interpretation on 09/04/2019 at 6:53 pm to provider Ovidio Kin, who verbally acknowledged these results. Electronically Signed   By: Judie Petit.  Shick M.D.   On: 09/04/2019 18:56   Dg Chest Port 1 View  Result Date: 09/11/2019 CLINICAL DATA:  Dyspnea EXAM: PORTABLE CHEST 1 VIEW COMPARISON:  09/06/2019 FINDINGS: The heart and mediastinum are normal. Pulmonary hyperinflation  and emphysema without acute airspace opacity. Esophagogastric tube with tip below the diaphragm but side-port at the level of the gastroesophageal junction. IMPRESSION: 1. Pulmonary hyperinflation and emphysema without acute airspace opacity. 2. Esophagogastric tube with tip below the diaphragm but side-port at the level of the gastroesophageal junction. Recommend advancement to ensure subdiaphragmatic position. Electronically Signed   By: Lauralyn Primes M.D.   On: 09/11/2019 09:11  Dg Chest Port 1 View  Result Date: 09/06/2019 CLINICAL DATA:  Acute respiratory failure with hypoxia. EXAM: PORTABLE CHEST 1 VIEW COMPARISON:  September 05, 2019. FINDINGS: The heart size and mediastinal contours are within normal limits. Endotracheal and nasogastric tube are unchanged in position. No pneumothorax or pleural effusion is noted. Right lung is clear. Minimal left basilar subsegmental atelectasis is noted. The visualized skeletal structures are unremarkable. IMPRESSION: Stable support apparatus. Minimal left basilar subsegmental atelectasis. Electronically Signed   By: Lupita RaiderJames  Green Jr M.D.   On: 09/06/2019 07:55   Dg Chest Port 1 View  Result Date: 09/05/2019 CLINICAL DATA:  Endotracheal tube position. EXAM: PORTABLE CHEST 1 VIEW COMPARISON:  August 30, 2019. FINDINGS: The heart size and mediastinal contours are within normal limits. Endotracheal and nasogastric tubes are in grossly good position. No pneumothorax is noted. No acute pulmonary disease is noted. The visualized skeletal structures are unremarkable. IMPRESSION: Endotracheal and nasogastric tubes are in grossly good position. No acute cardiopulmonary abnormality seen. Electronically Signed   By: Lupita RaiderJames  Green Jr M.D.   On: 09/05/2019 07:53   Dg Chest Portable 1 View  Result Date: 08/31/2019 CLINICAL DATA:  Shortness of breath. Cough. Wheezing. COPD. EXAM: PORTABLE CHEST 1 VIEW COMPARISON:  Radiograph 07/17/2017, CT 12/03/2016 FINDINGS: Chronic  hyperinflation and emphysema. Unchanged heart size and mediastinal contours. No acute airspace disease, pulmonary edema, large pleural effusion or pneumothorax. There is biapical pleuroparenchymal scarring. No acute osseous abnormalities. IMPRESSION: Chronic hyperinflation and emphysema. No superimposed acute abnormality. Electronically Signed   By: Narda RutherfordMelanie  Sanford M.D.   On: 08/31/2019 00:15   Dg Abd Acute 2+v W 1v Chest  Addendum Date: 09/04/2019   ADDENDUM REPORT: 09/04/2019 16:14 ADDENDUM: These results were called by telephone at the time of interpretation on 09/04/2019 at 4:14 pm to provider [facility] , who verbally acknowledged these results. Electronically Signed   By: Katherine Mantlehristopher  Green M.D.   On: 09/04/2019 16:14   Result Date: 09/04/2019 CLINICAL DATA:  Shortness of breath EXAM: DG ABDOMEN ACUTE W/ 1V CHEST COMPARISON:  September 02, 2019 FINDINGS: The lungs are hyperexpanded. There is no pneumothorax. Emphysematous changes are noted. There is an airspace opacity in the right upper lobe. Pleuroparenchymal scarring is noted the lung apices. Heart size is stable. Scarring versus atelectasis is noted at the lung bases. Evaluation of the abdomen demonstrates pneumoperitoneum. There are dilated loops of small bowel measuring up to approximately 5 cm. There is a large amount of stool in the right hemicolon. The patient is status post total hip arthroplasty on the left. Degenerative changes are noted throughout the lumbar spine. IMPRESSION: 1. Moderate volume pneumoperitoneum concerning for hollow viscus perforation. 2. Dilated loops of small bowel concerning for small bowel obstruction. 3. Right upper lobe airspace opacity. This could represent atelectasis or developing infiltrate. A pulmonary nodule is not entirely excluded. A 4-6 week follow-up two-view chest x-ray is recommended to confirm resolution of this finding. 4. Large amount of stool in the right hemicolon. Electronically Signed: By:  Katherine Mantlehristopher  Green M.D. On: 09/04/2019 16:05   Dg Abd Portable 1v  Result Date: 09/09/2019 CLINICAL DATA:  Abdominal distension. Status post exploratory laparotomy on 09/04/2019. EXAM: PORTABLE ABDOMEN - 1 VIEW COMPARISON:  09/06/2019 and abdomen pelvis CT dated 09/04/2019. FINDINGS: Multiple dilated small bowel loops with progression. Interval skin clips. There is stool and gas in normal caliber colon. Left hip prosthesis. Mild right hip degenerative changes. Probable distal nasogastric tube in the proximal stomach. IMPRESSION: 1. Progressive small  bowel dilatation, most likely representing postoperative ileus. Obstruction is also possible. 2. Probable distal nasogastric tube in the proximal stomach. Electronically Signed   By: Beckie Salts M.D.   On: 09/09/2019 06:24   Dg Abd Portable 1v  Result Date: 09/06/2019 CLINICAL DATA:  Nasogastric tube placement. EXAM: PORTABLE ABDOMEN - 1 VIEW COMPARISON:  September 04, 2019. FINDINGS: The bowel gas pattern is normal. Distal tip of nasogastric tube is seen in the stomach. No radio-opaque calculi or other significant radiographic abnormality are seen. IMPRESSION: Distal tip of nasogastric tube seen in the stomach. Electronically Signed   By: Lupita Raider M.D.   On: 09/06/2019 10:25   Dg Abd Portable 1v  Result Date: 09/02/2019 CLINICAL DATA:  Brief episode of heartburn with nausea and vomiting yesterday. EXAM: PORTABLE ABDOMEN - 1 VIEW COMPARISON:  Abdominal x-ray dated July 18, 2017. FINDINGS: The bowel gas pattern is normal. Moderate amount of stool in the right colon. No radio-opaque calculi or other significant radiographic abnormality are seen. No acute osseous abnormality. Prior left hip total arthroplasty. IMPRESSION: No acute findings. Electronically Signed   By: Obie Dredge M.D.   On: 09/02/2019 13:17    Assessment/Plan  #1 COPD exacerbation with history of chronic respiratory failure at this point he appears to be stable and improved  he continues on nebulizers continues on Proventil as needed also continues on ProAir Symbicort twice daily Mucinex 1200 mg twice daily as well as Singulair twice a day and low-dose prednisone 10 mg a day he also is on Spiriva.  He will need PT and OT secondary to deconditioning-.  2.-History of questionable pneumo peritoneum--Per CTscan he did have an exploratory laparotomy which was negative-surgical site at this point appears to be stable apparently-- at one time had some bleeding from this-but that resolved.  3.  History of acute blood loss anemia from surgical site apparently did stabilize hemoglobin was 11.9 on November 5 this will be updated in about 5 days clinically appears stable.  4.  History of pulmonary embolism he continues on Xarelto.  5.  History of new onset A. fib which apparently was transitory thought caused by his acute illness-he is on Xarelto.  6.  History of hypertension he continues on Maxide 37.5-25-blood pressure today 135/70 at this point will monitor.  7.  History of bladder spasms this was thought transitory secondary to the Foley catheter he had in the hospital and this apparently resolved.  8.  History of mild hypokalemia it appears potassium was 3.3 on lab done on November 9 will have this updated tomorrow.  [id number] note greater than 35 minutes spent assessing patient-reviewing his chart and labs-and coordinating and formulating a plan of care for numerous diagnoses-of note greater than 50% of time spent coordinating plan of care with input as noted above      Edmon Crape, PA-C [phone]

## 2019-09-19 NOTE — Telephone Encounter (Signed)
Pt was on TCM report admitted 08/30/19 for evaluation of shortness of breath. Patient was found to be tachycardic, with labored respirations, on 6 L/min of supplemental oxygen, and with stable blood pressure. EKG features sinus tachycardia with rate 112. Chest x-ray is notable for chronic hyperinflation and emphysema without acute findings. Pt dx w/ COPD with acute exacerbation . Pt D/C 09/18/19 to SNF. Per summary will need to f/u w/PCP 7 days after leaving SNF.Marland KitchenJohny Chess

## 2019-09-20 LAB — BASIC METABOLIC PANEL
BUN: 20 (ref 4–21)
CO2: 37 — AB (ref 13–22)
Chloride: 91 — AB (ref 99–108)
Creatinine: 1 (ref 0.6–1.3)
Glucose: 96
Potassium: 3.7 (ref 3.4–5.3)
Sodium: 137 (ref 137–147)

## 2019-09-20 LAB — COMPREHENSIVE METABOLIC PANEL
Calcium: 9 (ref 8.7–10.7)
GFR calc Af Amer: 87.68
GFR calc non Af Amer: 75.65

## 2019-09-22 ENCOUNTER — Encounter: Payer: Self-pay | Admitting: Internal Medicine

## 2019-09-22 ENCOUNTER — Non-Acute Institutional Stay (SKILLED_NURSING_FACILITY): Payer: PPO | Admitting: Internal Medicine

## 2019-09-22 DIAGNOSIS — J441 Chronic obstructive pulmonary disease with (acute) exacerbation: Secondary | ICD-10-CM | POA: Diagnosis not present

## 2019-09-22 DIAGNOSIS — Z86711 Personal history of pulmonary embolism: Secondary | ICD-10-CM

## 2019-09-22 DIAGNOSIS — J9621 Acute and chronic respiratory failure with hypoxia: Secondary | ICD-10-CM

## 2019-09-22 DIAGNOSIS — I10 Essential (primary) hypertension: Secondary | ICD-10-CM

## 2019-09-22 DIAGNOSIS — D62 Acute posthemorrhagic anemia: Secondary | ICD-10-CM | POA: Diagnosis not present

## 2019-09-22 DIAGNOSIS — I480 Paroxysmal atrial fibrillation: Secondary | ICD-10-CM

## 2019-09-22 DIAGNOSIS — K668 Other specified disorders of peritoneum: Secondary | ICD-10-CM | POA: Diagnosis not present

## 2019-09-22 NOTE — Progress Notes (Signed)
: Provider:  Margit Hanks., MD Location:  [facility] Room Number: [id number] Place of Service:  SNF ([phone])  PCP: [name], MD Patient Care Team: [name], MD as PCP - General (Internal Medicine) Frederica Kuster, MD as Referring Physician (Family Medicine) Myrtie Neither Andreas Blower, MD as Consulting Physician (Gastroenterology) Lupita Leash, MD as Consulting Physician (Pulmonary Disease)  Extended Emergency Contact Information Primary Emergency Contact: Wynetta Fines of [place] Home Phone: [phone] Work Phone: [phone] Mobile Phone: [phone] Relation: Sister Secondary Emergency Contact: Sharol Roussel [place] of [place] Mobile Phone: [phone] Relation: Sister Mother: Thorn, Demas [place] of [place] Home Phone: [phone]     Allergies: Naproxen sodium  Chief Complaint  Patient presents with  . New Admit To SNF    New admission to [facility] SNF visit    HPI: Patient is a 63 y.o. male with steroid-dependent COPD, chronic hypoxic respite tori failure, CAD, chronic leg swelling, and history of PE on Xarelto who presented to [facility], ED with exertional DOE for approximately 2 days, worsened with increased productive cough and wheezing and inability to recover after becoming acutely dyspneic with mild exertion.  On arrival to ED patient was found to be tachycardic, labored respirations, 6 L of O2, with stable blood pressure.  Chest x-ray is notable for chronic hyperinflation and emphysema without acute findings, CBC with a leukocytosis of 12,300.  Patient was treated with IV magnesium, Atrovent albuterol and Rocephin in the ED.  Patient is admitted to [facility] from 10/21-11/9 where he was treated for acute respiratory failure with hypoxia secondary to acute COPD exacerbation.  On 10/26 patient developed abdominal pain and was noted to have free air under the  diaphragm on CT abdomen pelvis and was taken emergently to the OR by general surgery and underwent exploratory lap.  No findings to suggest etiology were found.  Hospital course was further complicated by acute blood loss anemia but did not require transfusion and paroxysmal atrial fib new onset on 10/27 that reverted back to normal sinus rhythm.  Patient admitted to skilled nursing facility for OT/PT.  While at skilled nursing facility patient will be followed for history of PE and DVT treated with Eliquis, purulent rhinitis treated with Flonase and hypertension treated with triamterene/hydrochlorothiazide  Past Medical History:  Diagnosis Date  . Anxiety   . Arthritis    "back" (01/24/2014)  . Asthma   . Chronic bronchitis (HCC)   . Cluster headache    "last bout was summer 2014; had them q spring 1991-2001" (01/24/2014)  . COPD (chronic obstructive pulmonary disease) (HCC)   . GERD (gastroesophageal reflux disease)   . NSTEMI (non-ST elevated myocardial infarction) (HCC) 01/2014  . On home oxygen therapy    "2L; 24/7" (01/24/2014)  . Pneumonia 06/2013; 01/2014  . Pulmonary embolism (HCC) 01/23/2014  . Seasonal allergies     Past Surgical History:  Procedure Laterality Date  . COLONOSCOPY WITH PROPOFOL N/A 10/20/2016   Procedure: COLONOSCOPY WITH PROPOFOL;  Surgeon: Sherrilyn Rist, MD;  Location: [facility];  Service: Gastroenterology;  Laterality: N/A;  . LAPAROTOMY N/A 09/04/2019   Procedure: EXPLORATORY LAPAROTOMY;  Surgeon: Ovidio Kin, MD;  Location: [facility] ORS;  Service: General;  Laterality: N/A;  . NASAL SEPTOPLASTY W/ TURBINOPLASTY Left 1983  . TOTAL HIP ARTHROPLASTY Left 07/16/2017   Procedure: TOTAL HIP ARTHROPLASTY ANTERIOR APPROACH;  Surgeon: Leandrew Koyanagi, MD;  Location: [facility];  Service: Orthopedics;  Laterality: Left;  . TURBINATE REDUCTION Bilateral ~ 1983    Allergies as of 09/22/2019      Reactions   Naproxen Sodium Itching      Medication List       Accurate as  of September 22, 2019 12:35 PM. If you have any questions, ask your nurse or doctor.        albuterol (2.5 MG/3ML) 0.083% nebulizer solution Commonly known as: PROVENTIL Take 3 mLs (2.5 mg total) by nebulization every 6 (six) hours as needed for wheezing or shortness of breath.   ProAir HFA 108 (90 Base) MCG/ACT inhaler Generic drug: albuterol INHALE 2 PUFFS BY MOUTH EVERY 6 HOURS AS NEEDED FOR WHEEZING OR SHORTNESS OF BREATH   budesonide-formoterol 160-4.5 MCG/ACT inhaler Commonly known as: Symbicort INHALE 2 PUFFS FIRST THING IN THE MORNING AND ANOTHER 2 PUFFS ABOUT 12 HOURS LATER   fluticasone 50 MCG/ACT nasal spray Commonly known as: FLONASE Place 1 spray into both nostrils daily.   guaiFENesin 600 MG 12 hr tablet Commonly known as: MUCINEX Take 2 tablets (1,200 mg total) by mouth 2 (two) times daily.   montelukast 10 MG tablet Commonly known as: SINGULAIR TAKE 1 TABLET(10 MG) BY MOUTH AT BEDTIME   MULTI VITAMIN PO Take 1 tablet by mouth daily.   OXYGEN Inhale 2 L/min into the lungs as needed. For sats less than 90%   polyethylene glycol 17 g packet Commonly known as: MIRALAX / GLYCOLAX Take 17 g by mouth daily.   predniSONE 10 MG tablet Commonly known as: DELTASONE TAKE 1 TABLET BY MOUTH DAILY   rivaroxaban 20 MG Tabs tablet Commonly known as: Xarelto TAKE 1 TABLET(20 MG) BY MOUTH EVERY EVENING   sodium chloride 0.65 % Soln nasal spray Commonly known as: OCEAN Place 1 spray into both nostrils 4 (four) times daily as needed for congestion.   Spiriva HandiHaler 18 MCG inhalation capsule Generic drug: tiotropium INHALE THE CONTENTS OF 1 CAPSULE VIA INHALATION DEVICE EVERY DAY   triamterene-hydrochlorothiazide 37.5-25 MG tablet Commonly known as: MAXZIDE-25 Take 1 tablet by mouth daily.       No orders of the defined types were placed in this encounter.   Immunization History  Administered Date(s) Administered  . Fluad Quad(high Dose 65+) 07/05/2019   . Influenza Split 08/10/2011, 07/27/2012  . Influenza,inj,Quad PF,6+ Mos 07/14/2013, 08/06/2014, 10/17/2015, 07/21/2016, 08/26/2017, 07/28/2018  . PPD Test 07/22/2017, 07/29/2017  . Pneumococcal Conjugate-13 08/06/2014  . Pneumococcal Polysaccharide-23 02/05/2012  . Tdap 09/03/2014, 07/14/2017    Social History   Tobacco Use  . Smoking status: Former Smoker    Packs/day: 2.00    Years: 35.00    Pack years: 70.00    Types: Cigarettes    Quit date: 01/24/2012    Years since quitting: 7.6  . Smokeless tobacco: Never Used  . Tobacco comment: Remain smoke free  Substance Use Topics  . Alcohol use: Not Currently    Family history is   Family History  Problem Relation Age of Onset  . Leukemia Father   . Heart disease Father   . Asthma Father   . Asthma Sister   . Emphysema Sister   . Heart disease Sister   . High blood pressure Mother   . Colon cancer Cousin        First cousin      Review of Systems  DATA OBTAINED: from patient GENERAL:  no fevers, fatigue, appetite  changes SKIN: No itching, or rash EYES: No eye pain, redness, discharge EARS: No earache, tinnitus, change in hearing NOSE: No congestion, drainage or bleeding  MOUTH/THROAT: No mouth or tooth pain, No sore throat RESPIRATORY: No cough, wheezing, improving shortness of breath SOB CARDIAC: No chest pain, palpitations, lower extremity edema  GI: No abdominal pain, No N/V/D or constipation, No heartburn or reflux  GU: No dysuria, frequency or urgency, or incontinence  MUSCULOSKELETAL: No unrelieved bone/joint pain NEUROLOGIC: No headache, dizziness or focal weakness PSYCHIATRIC: No c/o anxiety or sadness   Vitals:   09/22/19 1228  BP: 135/78  Pulse: 91  Resp: 18  Temp: 98 F (36.7 C)  SpO2: 96%    SpO2 Readings from Last 1 Encounters:  09/22/19 96%   Body mass index is 25.34 kg/m.     Physical Exam  GENERAL APPEARANCE: Alert, conversant,  No acute distress.  SKIN: No diaphoresis rash  HEAD: Normocephalic, atraumatic  EYES: Conjunctiva/lids clear. Pupils round, reactive. EOMs intact.  EARS: External exam WNL, canals clear. Hearing grossly normal.  NOSE: No deformity or discharge.  MOUTH/THROAT: Lips w/o lesions  RESPIRATORY: Breathing is even, unlabored. Lung sounds are clear and full anteriorly, tight with some wheezing posteriorly CARDIOVASCULAR: Heart RRR no murmurs, rubs or gallops. No peripheral edema.   GASTROINTESTINAL: Abdomen is soft, non-tender, not distended w/ normal bowel sounds. GENITOURINARY: Bladder non tender, not distended  MUSCULOSKELETAL: No abnormal joints or musculature NEUROLOGIC:  Cranial nerves 2-12 grossly intact. Moves all extremities  PSYCHIATRIC: Mood and affect appropriate to situation, no behavioral issues  Patient Active Problem List   Diagnosis Date Noted  . Pressure injury of skin 09/05/2019  . Pneumoperitoneum 09/04/2019  . Current chronic use of systemic steroids 09/04/2019  . COPD with acute exacerbation (HCC) 08/31/2019  . Hyponatremia 08/31/2019  . Acute on chronic respiratory failure with hypoxia (HCC)   . Depression 08/22/2018  . Pre-diabetes 08/22/2018  . Trochanteric bursitis, left hip 11/12/2017  . Allergic rhinitis 10/11/2017  . Constipation due to opioid therapy 08/06/2017  . Recurrent pulmonary emboli (HCC) 07/26/2017  . History of MI (myocardial infarction) 07/26/2017  . History of total hip replacement, left 07/26/2017  . Chronic respiratory failure with hypoxia (HCC) 07/16/2017  . Left displaced femoral neck fracture (HCC) 07/14/2017  . Closed displaced fracture of left femoral neck (HCC) 07/14/2017  . Hypokalemia   . Cardiac arrhythmia 07/06/2017  . Costochondral chest pain 12/28/2016  . Bilateral lower extremity edema 12/28/2016  . Chronic anticoagulation 12/23/2016  . DOE (dyspnea on exertion) 12/03/2016  . Chest pain 12/03/2016  . Anxiety state 10/30/2015  . Preventative health care 09/03/2014  . Back  pain 09/03/2014  . HBP (high blood pressure) 01/29/2014  . CAD (coronary artery disease) 01/23/2014  . Chronic respiratory failure assoc with cor pulmonale 01/20/2014  . Acute cor pulmonale (HCC) 01/12/2014  . Multiple lung nodules 10/24/2013  . COPD exacerbation (HCC) 06/13/2013  . Smoking 03/03/2012  . COPD GOLD IV  09/28/2011      Labs reviewed: Basic Metabolic Panel:    Component Value Date/Time   NA 136 09/18/2019 0432   NA 140 08/07/2017   K 3.3 (L) 09/18/2019 0432   CL 91 (L) 09/18/2019 0432   CO2 32 09/18/2019 0432   GLUCOSE 99 09/18/2019 0432   BUN 27 (H) 09/18/2019 0432   BUN 12 08/07/2017   CREATININE 0.94 09/18/2019 0432   CALCIUM 8.9 09/18/2019 0432   PROT 6.2 (L) 09/01/2019 0309   ALBUMIN 3.3 (  L) 09/01/2019 0309   AST 21 09/01/2019 0309   ALT 15 09/01/2019 0309   ALKPHOS 47 09/01/2019 0309   BILITOT 0.8 09/01/2019 0309   GFRNONAA >60 09/18/2019 0432   GFRAA >60 09/18/2019 0432    Recent Labs    09/16/19 0442 09/17/19 0506 09/18/19 0432  NA 140 140 136  K 3.5 3.5 3.3*  CL 96* 90* 91*  CO2 34* 36* 32  GLUCOSE 95 108* 99  BUN 22 28* 27*  CREATININE 0.98 1.07 0.94  CALCIUM 8.6* 9.4 8.9  MG 2.0 1.9 1.8   Liver Function Tests: Recent Labs    07/05/19 1107 08/31/19 0321 09/01/19 0309  AST ALT ALKPHOS 63 54 47  BILITOT 0.5 0.6 0.8  PROT 6.9 6.7 6.2*  ALBUMIN 4.1 3.7 3.3*   No results for input(s): LIPASE, AMYLASE in the last 8760 hours. No results for input(s): AMMONIA in the last 8760 hours. CBC: Recent Labs    08/30/19 2339  08/31/19 0321 09/01/19 0309  09/12/19 0212 09/13/19 0207 09/14/19 0513  WBC 12.3*  --  14.8* 16.4*   < > 15.8* 14.5* 12.7*  NEUTROABS 9.1*  --  13.9* 14.9*  --   --   --   --   HGB 14.2   < > 14.1 12.7*   < > 11.5* 11.1* 11.9*  HCT 43.6   < > 44.1 38.9*   < > 38.1* 35.7* 38.0*  MCV 95.4  --  96.7 97.0   < > 103.8* 101.4* 99.7  PLT 408*  --  351 357   < > 300 260 281   < > = values in  this interval not displayed.   Lipid Recent Labs    09/04/19 2339  TRIG 127    Cardiac Enzymes: No results for input(s): CKTOTAL, CKMB, CKMBINDEX, TROPONINI in the last 8760 hours. BNP: Recent Labs    08/30/19 2339  BNP 44.0   Lab Results  Component Value Date   MICROALBUR 0.8 04/30/2016   Lab Results  Component Value Date   HGBA1C 5.6 08/18/2018   Lab Results  Component Value Date   TSH 1.20 09/03/2014   No results found for: VITAMINB12 No results found for: FOLATE No results found for: IRON, TIBC, FERRITIN  Imaging and Procedures obtained prior to SNF admission: Dg Chest Portable 1 View  Result Date: 08/31/2019 CLINICAL DATA:  Shortness of breath. Cough. Wheezing. COPD. EXAM: PORTABLE CHEST 1 VIEW COMPARISON:  Radiograph 07/17/2017, CT 12/03/2016 FINDINGS: Chronic hyperinflation and emphysema. Unchanged heart size and mediastinal contours. No acute airspace disease, pulmonary edema, large pleural effusion or pneumothorax. There is biapical pleuroparenchymal scarring. No acute osseous abnormalities. IMPRESSION: Chronic hyperinflation and emphysema. No superimposed acute abnormality. Electronically Signed   By: Narda Rutherford M.D.   On: 08/31/2019 00:15     Not all labs, radiology exams or other studies done during hospitalization come through on my EPIC note; however they are reviewed by me.    Assessment and Plan  Acute on chronic respiratory failure with hypoxia/acute COPD exacerbation-no acute findings with labs; improved with steroids nebs increased O2 SNF-admitted for OT/PT continue routine MDIs including Symbicort and Spiriva along with usual prednisone 10 mg daily and Mucinex 1200 mg twice daily; start albuterol nebs twice daily scheduled  Pneumoperitoneum/postop ileus-on 10/26 patient developed abdominal pain and CT abdomen showed free air under the diaphragm; patient taken to the OR and underwent exploratory lap per Dr. Ezzard Standing with  no findings to suggest  etiology; postop ileus resolved  Acute blood loss anemia-hemoglobin stabilized SNF-follow-up CBC  Hypertension SNF-controlled; continue triamterene/hydrochlorothiazide 37.5-25 1 p.o. daily  Paroxysmal atrial fibrillation-onset noted 10/27, likely exacerbated by acute illness, back in normal sinus rhythm, patient already on Xarelto SNF-continue Xarelto 20 mg daily  History of PE SNF-continue Xarelto 20 mg daily    ;> 50% of time with patient was spent reviewing records, labs, tests and studies, counseling and developing plan of care  Margit HanksAnne D , MD

## 2019-09-23 ENCOUNTER — Encounter: Payer: Self-pay | Admitting: Internal Medicine

## 2019-09-23 DIAGNOSIS — I480 Paroxysmal atrial fibrillation: Secondary | ICD-10-CM | POA: Insufficient documentation

## 2019-09-23 DIAGNOSIS — Z86711 Personal history of pulmonary embolism: Secondary | ICD-10-CM | POA: Insufficient documentation

## 2019-09-23 DIAGNOSIS — D62 Acute posthemorrhagic anemia: Secondary | ICD-10-CM | POA: Insufficient documentation

## 2019-09-25 DIAGNOSIS — I10 Essential (primary) hypertension: Secondary | ICD-10-CM | POA: Diagnosis not present

## 2019-09-28 ENCOUNTER — Encounter: Payer: Self-pay | Admitting: Primary Care

## 2019-09-28 ENCOUNTER — Ambulatory Visit (INDEPENDENT_AMBULATORY_CARE_PROVIDER_SITE_OTHER): Payer: PPO | Admitting: Primary Care

## 2019-09-28 ENCOUNTER — Other Ambulatory Visit: Payer: Self-pay

## 2019-09-28 DIAGNOSIS — J9611 Chronic respiratory failure with hypoxia: Secondary | ICD-10-CM | POA: Diagnosis not present

## 2019-09-28 DIAGNOSIS — I2699 Other pulmonary embolism without acute cor pulmonale: Secondary | ICD-10-CM | POA: Diagnosis not present

## 2019-09-28 DIAGNOSIS — D649 Anemia, unspecified: Secondary | ICD-10-CM | POA: Diagnosis not present

## 2019-09-28 DIAGNOSIS — J449 Chronic obstructive pulmonary disease, unspecified: Secondary | ICD-10-CM | POA: Diagnosis not present

## 2019-09-28 DIAGNOSIS — I10 Essential (primary) hypertension: Secondary | ICD-10-CM | POA: Diagnosis not present

## 2019-09-28 NOTE — Assessment & Plan Note (Signed)
-   On 3L continuous oxygen, titrate to keep O2 >88-90%

## 2019-09-28 NOTE — Assessment & Plan Note (Addendum)
-   Hospitalized in October for COPD exacerbation and acute respiratory failure - Clinically improving  - Continue Symbicort 160 and Spiriva handihaler - Continue Mucinex 1,200mg  twice daily with 6-8oz water - Increase prednisone 20mg  x 5 days; then resume daily 10mg   - Use IS and flutter valve three times a day - Adding Spacer with hfa inhaler  - FU in 6 weeks with new [facility] pulmonary doctor (previous [facility] patient)

## 2019-09-28 NOTE — Assessment & Plan Note (Signed)
Continues Xarelto 

## 2019-09-28 NOTE — Progress Notes (Signed)
@Patient  ID: , male    DOB: 23-Mar-1956, 63 y.o.   MRN: [id number]  Chief Complaint  Patient presents with   Hospitalization Follow-up    COPD exacerbation, Dyspnea    Referring provider: [id number], MD   Synopsis: Has stage IV COPD (steroid dependent) and history of a pulmonary embolism after a hospitalization in 2015 formerly followed by Dr. 2016.  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 PMH significant for COPD GOLD IV, chronic respiratory failure, allergic rhinitis, CAD, PE, cardiac arrhythmia, MI, multiple pulmonary nodules, pre-diabetes, left total hip replacement. Patient of Dr. 2014, last seen on 01/27/19. Maintained on Spiriva, Singulair, azithromycin, prednisone 10mg  daily. Chronic oxygen 2L on exertion. Recommended follow up in 3 months.   Previous [facility] pulmonary encounter: 07/05/2019 Patient presents today 3-5 month follow-up visit. Breathing is no worse but feels it is progressing. Chronic cough with mucus production. Not much color. Still taking azithromycin but not the last couple of days because he ran out. Compliant with Symbicort and Spiriva. Using nebulizer every 6 hours. Prednisone is at 10mg  daily. Not currently using oxygen. Has a concentrator at home but needs service. Occasionally misses mucinex dosing.   09/28/2019 Admitted to the hospital from 10/21-11/9 for COPD exacerbation, acute respiratory failure and pneumoperitoneum postoperative ileus s/p exploratory laparotomy by Dr. without any findings. Difficult to extubate postoperatively and remained on ventilatory support for 48 hours before being  extubated on 10/28. CXR showed hyperinflation and emphysema without acute findings. Covid negative. He was discharged to SNF. Patient is still at facility. Reports that he is doing better than he was. Receiving PT/OT and feels he is making improvements. Still feels tired. Using 3L oxygen. Has productive cough, getting up mucus is the morning. Taking 1,200mg  mucinex twice daily. Continues taking Symbicort and Spiriva as directed.  Significant testing: PFT's 12/02/2011  FEV1  0.94 (24%) ratio 39 -  18% response to B 2 and DLCO 60%  Lab -Alpha 1 genotype sent 03/01/12 >  MM   Echo: January 2018 echocardiogram reviewed showing a normal LVEF but mild pulmonary hypertension  Allergies  Allergen Reactions   Naproxen Sodium Itching    Immunization History  Administered Date(s) Administered   Fluad Quad(high Dose 65+) 07/05/2019   Influenza Split 08/10/2011, 07/27/2012   Influenza,inj,Quad PF,6+ Mos 07/14/2013, 08/06/2014, 10/17/2015, 07/21/2016, 08/26/2017, 07/28/2018   PPD Test 07/22/2017, 07/29/2017   Pneumococcal Conjugate-13 08/06/2014   Pneumococcal Polysaccharide-23 02/05/2012   Tdap 09/03/2014, 07/14/2017    Past Medical History:  Diagnosis Date   Anxiety    Arthritis    "back" (01/24/2014)   Asthma    Chronic bronchitis (HCC)    Cluster headache    "last bout was summer 2014; had them q spring 1991-2001" (01/24/2014)   COPD (chronic obstructive pulmonary disease) (HCC)    GERD (gastroesophageal reflux disease)    NSTEMI (non-ST elevated myocardial infarction) (HCC) 01/2014   On home oxygen therapy    "2L; 24/7" (01/24/2014)   Pneumonia 06/2013; 01/2014   Pulmonary embolism (HCC) 01/23/2014   Seasonal allergies     Tobacco History: Social History   Tobacco Use  Smoking Status Former Smoker   Packs/day: 2.00   Years: 35.00   Pack years: 70.00   Types: Cigarettes   Quit date: 01/24/2012   Years since quitting: 7.6  Smokeless Tobacco Never Used    Tobacco Comment   Remain smoke free   Counseling given: Not Answered Comment: Remain smoke free   Outpatient Medications Prior to Visit  Medication Sig Dispense Refill   albuterol (PROVENTIL) (2.5 MG/3ML) 0.083% nebulizer solution Take 3 mLs (2.5 mg total) by nebulization every 6 (six) hours as needed for wheezing or shortness of breath. 360 mL 11   budesonide-formoterol (SYMBICORT) 160-4.5 MCG/ACT inhaler INHALE 2 PUFFS FIRST THING IN THE MORNING AND ANOTHER 2 PUFFS ABOUT 12 HOURS LATER 10.2 g 5   fluticasone (FLONASE) 50 MCG/ACT nasal spray Place 1 spray into both nostrils daily.     guaiFENesin (MUCINEX) 600 MG 12 hr tablet Take 2 tablets (1,200 mg total) by mouth 2 (two) times daily. 60 tablet 3   montelukast (SINGULAIR) 10 MG tablet TAKE 1 TABLET(10 MG) BY MOUTH AT BEDTIME 30 tablet 3   Multiple Vitamin (MULTI VITAMIN PO) Take 1 tablet by mouth daily.     OXYGEN Inhale 2 L/min into the lungs as needed. For sats less than 90%     polyethylene glycol (MIRALAX / GLYCOLAX) 17 g packet Take 17 g by mouth daily.     predniSONE (DELTASONE) 10 MG tablet TAKE 1 TABLET BY MOUTH DAILY 30 tablet 2   PROAIR HFA 108 (90 Base) MCG/ACT inhaler INHALE 2 PUFFS BY MOUTH EVERY 6 HOURS AS NEEDED FOR WHEEZING OR SHORTNESS OF BREATH 8.5 g 11   rivaroxaban (XARELTO) 20 MG TABS tablet TAKE 1 TABLET(20 MG) BY MOUTH EVERY EVENING 30 tablet 5   sodium chloride (OCEAN) 0.65 % SOLN nasal spray Place 1 spray into both nostrils 4 (four) times daily as needed for congestion.     tiotropium (SPIRIVA HANDIHALER) 18 MCG inhalation capsule INHALE THE CONTENTS OF 1 CAPSULE VIA INHALATION DEVICE EVERY DAY 30 capsule 5   triamterene-hydrochlorothiazide (MAXZIDE-25) 37.5-25 MG tablet Take 1 tablet by mouth daily. 90 tablet 1   No facility-administered medications prior to visit.    Review of Systems  Review of Systems  Constitutional: Positive for fatigue. Negative for chills and fever.  HENT: Negative.    Respiratory: Positive for cough and wheezing. Negative for chest tightness and shortness of breath.   Cardiovascular: Negative for leg swelling.   Physical Exam  BP 128/68 (BP Location: Right Arm, Patient Position: Sitting, Cuff Size: Normal)    Pulse 74    Temp (!) 97.3 F (36.3 C)    Ht  (1.93 m)    Wt 211 lb 12.8 oz (96.1 kg)    SpO2 100% Comment: on 3L continuous   BMI 25.78 kg/m  Physical Exam Constitutional:      Appearance: Normal appearance.  HENT:     Head: Normocephalic and atraumatic.  Cardiovascular:     Rate and Rhythm: Normal rate.     Comments: BLE legs wraps, no significant edema  Pulmonary:     Effort: Pulmonary effort is normal.     Breath sounds: Wheezing present.  Musculoskeletal:     Comments: In [facility]  Neurological:     General: No focal deficit present.     Mental Status: He is alert and oriented to person, place, and time. Mental status is at baseline.  Psychiatric:        Mood and Affect: Mood  normal.        Behavior: Behavior normal.        Thought Content: Thought content normal.        Judgment: Judgment normal.      Lab Results:  CBC    Component Value Date/Time   WBC 12.7 (H) 09/14/2019 0513   RBC 3.81 (L) 09/14/2019 0513   HGB 11.9 (L) 09/14/2019 0513   HCT 38.0 (L) 09/14/2019 0513   PLT 281 09/14/2019 0513   MCV 99.7 09/14/2019 0513   MCH 31.2 09/14/2019 0513   MCHC 31.3 09/14/2019 0513   RDW 13.7 09/14/2019 0513   LYMPHSABS 0.7 09/01/2019 0309   MONOABS 0.5 09/01/2019 0309   EOSABS 0.0 09/01/2019 0309   BASOSABS 0.0 09/01/2019 0309    BMET    Component Value Date/Time   NA 137 09/20/2019   K 3.7 09/20/2019   CL 91 (A) 09/20/2019   CO2 37 (A) 09/20/2019   GLUCOSE 99 09/18/2019 0432   BUN 20 09/20/2019   CREATININE 1.0 09/20/2019   CREATININE 0.94 09/18/2019 0432   CALCIUM 9.0 09/20/2019   GFRNONAA 75.65 09/20/2019   GFRAA 87.68 09/20/2019    BNP    Component Value Date/Time   BNP 44.0 08/30/2019 2339     ProBNP    Component Value Date/Time   PROBNP 38.0 08/18/2018 1144    Imaging: Ct Abdomen Pelvis W Contrast  Result Date: 09/04/2019 CLINICAL DATA:  New free air by plain radiography. COPD, hypoxia respiratory failure, CHF EXAM: CT ABDOMEN AND PELVIS WITH CONTRAST TECHNIQUE: Multidetector CT imaging of the abdomen and pelvis was performed using the standard protocol following bolus administration of intravenous contrast. CONTRAST:  100mL OMNIPAQUE IOHEXOL 300 MG/ML  SOLN COMPARISON:  None. FINDINGS: Lower chest: Basilar emphysema changes with hyperinflation. Normal heart size. No pericardial or pleural effusion. Aorta atherosclerotic. Hepatobiliary: No focal liver abnormality is seen. No gallstones, gallbladder wall thickening, or biliary dilatation. Pancreas: Unremarkable. No pancreatic ductal dilatation or surrounding inflammatory changes. Spleen: Normal in size without focal abnormality. Adrenals/Urinary Tract: Normal adrenal glands. Kidneys demonstrate parapelvic cysts bilaterally. No renal obstruction or hydronephrosis. No hydroureter or ureteral dilatation. Bladder is partially obscured by the left hip replacement hardware. Stomach/Bowel: Diffuse large volume of pneumoperitoneum throughout the abdomen compatible with perforated viscus. In the left mid abdomen, there 2 loops of dilated abnormal appearing small bowel with fecalization and what appears to be pneumatosis suspicious for ischemia. Stomach and duodenum are underdistended without surrounding inflammatory change or fluid. Moderate colonic stool burden. Normal appendix. Diverticulosis in the sigmoid region without acute inflammatory process or wall thickening. No fluid collection, abscess, hemorrhage, or ascites. Vascular/Lymphatic: Extensive calcific atherosclerosis of the aorta, iliac and abdominal vasculature. Mesenteric and renal vasculature do appear patent proximally. No veno-occlusive process.  No bulky adenopathy. Reproductive:  Unremarkable by CT. Other: Very small umbilical hernia containing fat and free air. No large ventral hernia. No inguinal hernia. Musculoskeletal: Degenerative changes of the spine. Previous left hip arthroplasty. No acute osseous finding. No compression fracture. IMPRESSION: Large volume of abdominopelvic pneumoperitoneum with dilated abnormal appearing small bowel in the left abdomen with fecalization and pneumatosis suspicious for ischemic small bowel. No significant free fluid, hemorrhage or abscess. These results were called by telephone at the time of interpretation on 09/04/2019 at 6:53 pm to provider Ovidio Kinavid Newman, who verbally acknowledged these results. Electronically Signed   By: Judie PetitM.  Shick M.D.   On: 09/04/2019 18:56   Dg Chest Port 1 View  Result Date: 09/11/2019  CLINICAL DATA:  Dyspnea EXAM: PORTABLE CHEST 1 VIEW COMPARISON:  09/06/2019 FINDINGS: The heart and mediastinum are normal. Pulmonary hyperinflation and emphysema without acute airspace opacity. Esophagogastric tube with tip below the diaphragm but side-port at the level of the gastroesophageal junction. IMPRESSION: 1. Pulmonary hyperinflation and emphysema without acute airspace opacity. 2. Esophagogastric tube with tip below the diaphragm but side-port at the level of the gastroesophageal junction. Recommend advancement to ensure subdiaphragmatic position. Electronically Signed   By: Eddie Candle M.D.   On: 09/11/2019 09:11   Dg Chest Port 1 View  Result Date: 09/06/2019 CLINICAL DATA:  Acute respiratory failure with hypoxia. EXAM: PORTABLE CHEST 1 VIEW COMPARISON:  September 05, 2019. FINDINGS: The heart size and mediastinal contours are within normal limits. Endotracheal and nasogastric tube are unchanged in position. No pneumothorax or pleural effusion is noted. Right lung is clear. Minimal left basilar subsegmental atelectasis is noted. The visualized skeletal structures are unremarkable. IMPRESSION: Stable support apparatus. Minimal  left basilar subsegmental atelectasis. Electronically Signed   By: Marijo Conception M.D.   On: 09/06/2019 07:55   Dg Chest Port 1 View  Result Date: 09/05/2019 CLINICAL DATA:  Endotracheal tube position. EXAM: PORTABLE CHEST 1 VIEW COMPARISON:  August 30, 2019. FINDINGS: The heart size and mediastinal contours are within normal limits. Endotracheal and nasogastric tubes are in grossly good position. No pneumothorax is noted. No acute pulmonary disease is noted. The visualized skeletal structures are unremarkable. IMPRESSION: Endotracheal and nasogastric tubes are in grossly good position. No acute cardiopulmonary abnormality seen. Electronically Signed   By: Marijo Conception M.D.   On: 09/05/2019 07:53   Dg Chest Portable 1 View  Result Date: 08/31/2019 CLINICAL DATA:  Shortness of breath. Cough. Wheezing. COPD. EXAM: PORTABLE CHEST 1 VIEW COMPARISON:  Radiograph 07/17/2017, CT 12/03/2016 FINDINGS: Chronic hyperinflation and emphysema. Unchanged heart size and mediastinal contours. No acute airspace disease, pulmonary edema, large pleural effusion or pneumothorax. There is biapical pleuroparenchymal scarring. No acute osseous abnormalities. IMPRESSION: Chronic hyperinflation and emphysema. No superimposed acute abnormality. Electronically Signed   By: Keith Rake M.D.   On: 08/31/2019 00:15   Dg Abd Acute 2+v W 1v Chest  Addendum Date: 09/04/2019   ADDENDUM REPORT: 09/04/2019 16:14 ADDENDUM: These results were called by telephone at the time of interpretation on 09/04/2019 at 4:14 pm to provider [facility] PATEL , who verbally acknowledged these results. Electronically Signed   By: Constance Holster M.D.   On: 09/04/2019 16:14   Result Date: 09/04/2019 CLINICAL DATA:  Shortness of breath EXAM: DG ABDOMEN ACUTE W/ 1V CHEST COMPARISON:  September 02, 2019 FINDINGS: The lungs are hyperexpanded. There is no pneumothorax. Emphysematous changes are noted. There is an airspace opacity in the right upper  lobe. Pleuroparenchymal scarring is noted the lung apices. Heart size is stable. Scarring versus atelectasis is noted at the lung bases. Evaluation of the abdomen demonstrates pneumoperitoneum. There are dilated loops of small bowel measuring up to approximately 5 cm. There is a large amount of stool in the right hemicolon. The patient is status post total hip arthroplasty on the left. Degenerative changes are noted throughout the lumbar spine. IMPRESSION: 1. Moderate volume pneumoperitoneum concerning for hollow viscus perforation. 2. Dilated loops of small bowel concerning for small bowel obstruction. 3. Right upper lobe airspace opacity. This could represent atelectasis or developing infiltrate. A pulmonary nodule is not entirely excluded. A 4-6 week follow-up two-view chest x-ray is recommended to confirm resolution of this finding. 4. Large amount  of stool in the right hemicolon. Electronically Signed: By: Katherine Mantle M.D. On: 09/04/2019 16:05   Dg Abd Portable 1v  Result Date: 09/09/2019 CLINICAL DATA:  Abdominal distension. Status post exploratory laparotomy on 09/04/2019. EXAM: PORTABLE ABDOMEN - 1 VIEW COMPARISON:  09/06/2019 and abdomen pelvis CT dated 09/04/2019. FINDINGS: Multiple dilated small bowel loops with progression. Interval skin clips. There is stool and gas in normal caliber colon. Left hip prosthesis. Mild right hip degenerative changes. Probable distal nasogastric tube in the proximal stomach. IMPRESSION: 1. Progressive small bowel dilatation, most likely representing postoperative ileus. Obstruction is also possible. 2. Probable distal nasogastric tube in the proximal stomach. Electronically Signed   By: Beckie Salts M.D.   On: 09/09/2019 06:24   Dg Abd Portable 1v  Result Date: 09/06/2019 CLINICAL DATA:  Nasogastric tube placement. EXAM: PORTABLE ABDOMEN - 1 VIEW COMPARISON:  September 04, 2019. FINDINGS: The bowel gas pattern is normal. Distal tip of nasogastric tube is seen  in the stomach. No radio-opaque calculi or other significant radiographic abnormality are seen. IMPRESSION: Distal tip of nasogastric tube seen in the stomach. Electronically Signed   By: Lupita Raider M.D.   On: 09/06/2019 10:25   Dg Abd Portable 1v  Result Date: 09/02/2019 CLINICAL DATA:  Brief episode of heartburn with nausea and vomiting yesterday. EXAM: PORTABLE ABDOMEN - 1 VIEW COMPARISON:  Abdominal x-ray dated July 18, 2017. FINDINGS: The bowel gas pattern is normal. Moderate amount of stool in the right colon. No radio-opaque calculi or other significant radiographic abnormality are seen. No acute osseous abnormality. Prior left hip total arthroplasty. IMPRESSION: No acute findings. Electronically Signed   By: Obie Dredge M.D.   On: 09/02/2019 13:17     Assessment & Plan:   COPD GOLD IV  - Hospitalized in October for COPD exacerbation and acute respiratory failure - Clinically improving  - Continue Symbicort 160 and Spiriva handihaler - Continue Mucinex 1,200mg  twice daily with 6-8oz water - Increase prednisone  x 5 days; then resume daily   - Use IS and flutter valve three times a day - Adding Spacer with hfa inhaler  - FU in 6 weeks with new [facility] pulmonary doctor (previous [facility] patient)  Chronic respiratory failure with hypoxia (HCC) - On 3L continuous oxygen, titrate to keep O2 >88-90%  Recurrent pulmonary emboli (HCC) - Continues Xarelto    Glenford Bayley, NP 09/28/2019

## 2019-09-28 NOTE — Patient Instructions (Signed)
Recommendations: Take 20mg  prednisone x 5 days; then resume 10mg  daily  Use spacer with Symbicort Please take mucinex with 6-8oz water  Please given Albuterol nebulizer scheduled every 8 hours Discontinue leg wraps, continue with compression stockings Use incentive spirometer and flutter valve 3x daily   Follow-up: 6 weeks with new [facility] pulmonary provider (previous [facility] patient)

## 2019-10-04 ENCOUNTER — Non-Acute Institutional Stay (SKILLED_NURSING_FACILITY): Payer: PPO | Admitting: Internal Medicine

## 2019-10-04 DIAGNOSIS — I2699 Other pulmonary embolism without acute cor pulmonale: Secondary | ICD-10-CM

## 2019-10-04 DIAGNOSIS — Z9889 Other specified postprocedural states: Secondary | ICD-10-CM | POA: Diagnosis not present

## 2019-10-04 DIAGNOSIS — J441 Chronic obstructive pulmonary disease with (acute) exacerbation: Secondary | ICD-10-CM | POA: Diagnosis not present

## 2019-10-04 DIAGNOSIS — D62 Acute posthemorrhagic anemia: Secondary | ICD-10-CM

## 2019-10-04 DIAGNOSIS — I10 Essential (primary) hypertension: Secondary | ICD-10-CM | POA: Diagnosis not present

## 2019-10-04 DIAGNOSIS — I499 Cardiac arrhythmia, unspecified: Secondary | ICD-10-CM | POA: Diagnosis not present

## 2019-10-04 NOTE — Progress Notes (Signed)
This is a discharge note.  Level of care skilled.  Facility is [facility].  Chief complaint discharge note.  History of present illness.  Patient is a very pleasant 63 year old male seen today for for discharge from facility early next week.  He is here for rehab after sustaining a COPD exacerbation which required hospitalization  This was complicated with an episode of abdominal pain in the hospital which resulted in exploratory laparotomy with difficult extubation.  Laparotomy apparently was done because of the abdominal discomfort and was noted to have a pneumoperitoneum on CT scan and subsequently the exploratory surgery was done without any findings to suggest an acute etiology.  He did have an ileus that resolved.  His other medical issues include coronary artery disease-chronic lower extremity edema as well as a history of pulmonary embolism on chronic Xarelto.  He also had atrial fibrillation which was thought to be related to his COPD exacerbation  He has done well here has gained strength will need continued PT and OT as well as a rolling walker secondary to some continued weakness and fall risk.  He will apparently be closely followed by family members once he is discharged.  He did see pulmonology on November 19 and thought to be doing well they did increase his prednisone up to 20 mg for a few days and then back to his baseline of 10 he also continues on Symbicort Spiriva Mucinex and albuterol nebs.  Currently he says his breathing has improved he is getting stronger and is looking forward to going home-- vital signs appear to be stable.      Allergies  Allergen Reactions  . Naproxen Sodium Itching        Immunization History  Administered Date(s) Administered  . Fluad Quad(high Dose 65+) 07/05/2019  . Influenza Split 08/10/2011, 07/27/2012  . Influenza,inj,Quad PF,6+ Mos 07/14/2013, 08/06/2014, 10/17/2015, 07/21/2016, 08/26/2017, 07/28/2018  . PPD Test  07/22/2017, 07/29/2017  . Pneumococcal Conjugate-13 08/06/2014  . Pneumococcal Polysaccharide-23 02/05/2012  . Tdap 09/03/2014, 07/14/2017        Past Medical History:  Diagnosis Date  . Anxiety   . Arthritis    "back" (01/24/2014)  . Asthma   . Chronic bronchitis (HCC)   . Cluster headache    "last bout was summer 2014; had them q spring 1991-2001" (01/24/2014)  . COPD (chronic obstructive pulmonary disease) (HCC)   . GERD (gastroesophageal reflux disease)   . NSTEMI (non-ST elevated myocardial infarction) (HCC) 01/2014  . On home oxygen therapy    "2L; 24/7" (01/24/2014)  . Pneumonia 06/2013; 01/2014  . Pulmonary embolism (HCC) 01/23/2014  . Seasonal allergies     Tobacco History: Social History       Tobacco Use  Smoking Status Former Smoker  . Packs/day: 2.00  . Years: 35.00  . Pack years: 70.00  . Types: Cigarettes  . Quit date: 01/24/2012  . Years since quitting: 7.6  Smokeless Tobacco Never Used  Tobacco Comment   Remain smoke free    Review of systems.  In general is not complaining of any fever chills feels he is getting stronger.  Skin does not complain of rashes itching or diaphoresis.  Head ears eyes nose mouth and throat does not complain of visual changes or sore throat.  Respiratory says his breathing has improved does not complain of shortness of breath beyond baseline occasionally will have a cough and wheezing but this is baseline.  Cardiac is not complaining of chest pain has chronic lower  extremity edema which appears to be stabilized.  GI is not complaining of abdominal discomfort nausea vomiting diarrhea constipation.  GU is not complaining of dysuria.  Musculoskeletal has weakness but has gained some strength is not really complaining of pain today.  Neurologic positive for weakness but does not complain of dizziness headache or syncope.  And psych continues to be in good spirits she is not complaining of being depressed or  anxious.  .  Physical exam.  Temperature is 97.4 pulse 82 respirations 18 blood pressure 137/80-oxygen saturations have been in the 90s on room air.  In general this is a very pleasant male in no distress sitting comfortably in his chair.  His skin is warm and dry.  He does have a healing surgical scar mid abdomen there is crusting-- no sign of infection with concerning erythema or drainage or tenderness  Eyes visual acuity appears to be intact sclera and conjunctive are clear.  Oropharynx clear mucous membranes moist.  Chest he does have some mild expiratory wheezing-this does clear somewhat with cough there is no labored breathing.  Heart is regular rate and rhythm with occasional irregular beats he has TED hose in place with a history of moderate lower extremity edema this appears relatively unchanged possibly improved.  Abdomen is soft nontender with positive bowel sounds.  Musculoskeletal is able to move all extremities x4 it appears at baseline with some continued lower extremity weakness he is using a walker.  Neurologic as noted above I could not appreciate lateralizing findings his speech is clear cranial nerves intact.  Psych he is alert and oriented very pleasant and appropriate.  Labs.  September 28, 2019.  WBC 9.2 hemoglobin 11.7 platelets 362.  Sodium 134 potassium 4.3 BUN 12.9 creatinine 0.79.  September 18, 2019.  Sodium 136 potassium 3.3 BUN 27 creatinine 0.94.  September 14, 2019.  WBC 12.7 hemoglobin 11.9 platelets 281  September 01, 2019.  Liver function tests within normal limits except albumin of 3.3.   Assessment and plan.  1.  History of COPD exacerbation-he appears to have made a nice recovery from this he has been seen by pulmonology and continues on Symbicort 160 mg twice daily as well as Spiriva HandiHaler-he is also on Mucinex 1200 mg twice daily and prednisone 10 mg a day after completing a course of 20 mg a day.  He also has albuterol  nebulizers and is on Flonase.  He will need follow-up by pulmonology as needed and they have arranged this it appears but is doing well.  2.  History of exploratory laparotomy again no acute etiology was found-he appears to be doing well he says he is having regular bowel movements he was apparently treated for an ileus in the hospital  #3-history of cardiac arrhythmia thought to be largely transitory secondary to his acute exacerbation at this point appears stable he has a few irregular beats at times on exam but appears to be stable  #4-history of pulmonary embolism he continues on chronic Xarelto 20 mg a day.  5.  History of blood loss anemia status post surgery this appears stable with a hemoglobin of 11.7 on lab done on November 19.  6.  History of hypertension this appears stable on Maxide 60.7-37.1 recent systolics have been in the 120-130's   area.  Again when he goes home he will need continued PT and OT he also will need a rolling walker to assist with ambulation with some continued weakness--will also need a nebulizer.  [id number]  note greater than 30 minutes spent on this discharge summary-greater than 50% of time spent coordinating a plan of care for numerous diagnoses

## 2019-10-05 ENCOUNTER — Encounter: Payer: Self-pay | Admitting: Internal Medicine

## 2019-10-09 ENCOUNTER — Other Ambulatory Visit: Payer: Self-pay | Admitting: Internal Medicine

## 2019-10-09 ENCOUNTER — Non-Acute Institutional Stay (SKILLED_NURSING_FACILITY): Payer: PPO | Admitting: Internal Medicine

## 2019-10-09 ENCOUNTER — Encounter: Payer: Self-pay | Admitting: Internal Medicine

## 2019-10-09 DIAGNOSIS — J441 Chronic obstructive pulmonary disease with (acute) exacerbation: Secondary | ICD-10-CM

## 2019-10-09 DIAGNOSIS — D62 Acute posthemorrhagic anemia: Secondary | ICD-10-CM | POA: Diagnosis not present

## 2019-10-09 DIAGNOSIS — K668 Other specified disorders of peritoneum: Secondary | ICD-10-CM

## 2019-10-09 DIAGNOSIS — I10 Essential (primary) hypertension: Secondary | ICD-10-CM | POA: Diagnosis not present

## 2019-10-09 DIAGNOSIS — J9621 Acute and chronic respiratory failure with hypoxia: Secondary | ICD-10-CM

## 2019-10-09 MED ORDER — MONTELUKAST SODIUM 10 MG PO TABS: ORAL_TABLET | ORAL | 0 refills | Status: DC

## 2019-10-09 MED ORDER — RIVAROXABAN 20 MG PO TABS: ORAL_TABLET | ORAL | 0 refills | Status: DC

## 2019-10-09 MED ORDER — ALBUTEROL SULFATE HFA 108 (90 BASE) MCG/ACT IN AERS: 2.0000 | INHALATION_SPRAY | Freq: Four times a day (QID) | RESPIRATORY_TRACT | 0 refills | Status: DC | PRN

## 2019-10-09 MED ORDER — PREDNISONE 10 MG PO TABS: 10.0000 mg | ORAL_TABLET | Freq: Every day | ORAL | 0 refills | Status: DC

## 2019-10-09 MED ORDER — TRIAMTERENE-HCTZ 37.5-25 MG PO TABS: 1.0000 | ORAL_TABLET | Freq: Every day | ORAL | 0 refills | Status: DC

## 2019-10-09 MED ORDER — BUDESONIDE-FORMOTEROL FUMARATE 160-4.5 MCG/ACT IN AERO: INHALATION_SPRAY | RESPIRATORY_TRACT | 0 refills | Status: DC

## 2019-10-09 MED ORDER — SPIRIVA HANDIHALER 18 MCG IN CAPS: ORAL_CAPSULE | RESPIRATORY_TRACT | 0 refills | Status: DC

## 2019-10-09 MED ORDER — FLUTICASONE PROPIONATE 50 MCG/ACT NA SUSP: 1.0000 | Freq: Every day | NASAL | 0 refills | Status: DC

## 2019-10-09 MED ORDER — GUAIFENESIN ER 600 MG PO TB12: 1200.0000 mg | ORAL_TABLET | Freq: Two times a day (BID) | ORAL | 0 refills | Status: AC

## 2019-10-09 MED ORDER — ALBUTEROL SULFATE (2.5 MG/3ML) 0.083% IN NEBU: 2.5000 mg | INHALATION_SOLUTION | Freq: Three times a day (TID) | RESPIRATORY_TRACT | 0 refills | Status: DC

## 2019-10-09 MED ORDER — ALBUTEROL SULFATE (2.5 MG/3ML) 0.083% IN NEBU: 2.5000 mg | INHALATION_SOLUTION | Freq: Four times a day (QID) | RESPIRATORY_TRACT | 0 refills | Status: DC | PRN

## 2019-10-09 NOTE — Progress Notes (Signed)
Location:   [place] of Service:   snf Provider: Hennie Duos MD  PCP: Biagio Borg, MD Patient Care Team: Biagio Borg, MD as PCP - General (Internal Medicine) Wardell Honour, MD as Referring Physician (Family Medicine) Loletha Carrow Kirke Corin, MD as Consulting Physician (Gastroenterology) Juanito Doom, MD as Consulting Physician (Pulmonary Disease)  Extended Emergency Contact Information Primary Emergency Contact: Conley Rolls, [place] Phone: [phone] Work Phone: [phone] Mobile Phone: [phone] Relation: Sister Secondary Emergency Contact: Lynnae [place] Mobile Phone: [phone] Relation: Sister  Allergies  Allergen Reactions   Naproxen Sodium Itching    Chief Complaint  Patient presents with   Discharge Note    HPI:  63 y.o. male with steroid-dependent COPD, chronic hypoxic respiratory failure, CAD, chronic leg swelling, and history of PE on Xarelto who presented to [facility] ED with exertional dyspnea on exertion for approximately 2 days.  On arrival to the ED patient was found to be tachycardic labored respirations on 6 L of O2 with a stable blood pressure.  Chest x-ray was notable for chronic hyperinflation and emphysema without acute findings.  CBC with leukocytosis of 12.3.  Patient was admitted to [facility] from 10/21-11/9 where he was treated for acute respiratory failure with hypoxia secondary to acute COPD exacerbation.On 10/26 patient developed abdominal pain, was found to have free air under the diaphragm on CT abdomen and was taken emergently to the OR and underwent exploratory lap in which no findings are present to suggest etiology.  Hospital course was further complicated by acute blood loss anemia but did not require transfusion and by paroxysmal atrial fib new onset on 10/27 which reverted back to normal sinus rhythm.  Patient is admitted to  skilled nursing facility for OT/PT and is now ready to be discharged to home.    Past Medical History:  Diagnosis Date   Anxiety    Arthritis    "back" (01/24/2014)   Asthma    Chronic bronchitis ([place])    Cluster headache    "last bout was summer 2014; had them q spring 1991-2001" (01/24/2014)   COPD (chronic obstructive pulmonary disease) (HCC)    GERD (gastroesophageal reflux disease)    NSTEMI (non-ST elevated myocardial infarction) ([place]) 01/2014   On home oxygen therapy    "2L; 24/7" (01/24/2014)   Pneumonia 06/2013; 01/2014   Pulmonary embolism ([place]) 01/23/2014   Seasonal allergies     Past Surgical History:  Procedure Laterality Date   COLONOSCOPY WITH PROPOFOL N/A 10/20/2016   Procedure: COLONOSCOPY WITH PROPOFOL;  Surgeon: Doran Stabler, MD;  Location: [facility];  Service: Gastroenterology;  Laterality: N/A;   LAPAROTOMY N/A 09/04/2019   Procedure: EXPLORATORY LAPAROTOMY;  Surgeon: Alphonsa Overall, MD;  Location: [facility];  Service: General;  Laterality: N/A;   NASAL SEPTOPLASTY W/ TURBINOPLASTY Left 1983   TOTAL HIP ARTHROPLASTY Left 07/16/2017   Procedure: TOTAL HIP ARTHROPLASTY ANTERIOR APPROACH;  Surgeon: Leandrew Koyanagi, MD;  Location: [facility];  Service: Orthopedics;  Laterality: Left;   TURBINATE REDUCTION Bilateral ~ 1983     reports that he quit smoking about 7 years ago. His smoking use included cigarettes. He has a 70.00 pack-year smoking history. He has never used smokeless tobacco. He reports previous alcohol use. He reports that he does not use drugs. Social History   Socioeconomic History   Marital status:  Divorced    Spouse name: Not on file   Number of children: 0   Years of education: 14   Highest education level: Not on file  Occupational History   Occupation: Disability    Employer: [employer]  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   Food insecurity    Worry: Never true    Inability: Never true     Transportation needs    Medical: No    Non-medical: No  Tobacco Use   Smoking status: Former Smoker    Packs/day: 2.00    Years: 35.00    Pack years: 70.00    Types: Cigarettes    Quit date: 01/24/2012    Years since quitting: 7.7   Smokeless tobacco: Never Used   Tobacco comment: Remain smoke free  Substance and Sexual Activity   Alcohol use: Not Currently   Drug use: No   Sexual activity: Never  Lifestyle   Physical activity    Days per week: 0 days    Minutes per session: 0 min   Stress: Only a little  Relationships   Social connections    Talks on phone: More than three times a week    Gets together: More than three times a week    Attends religious service: Never    Active member of club or organization: No    Attends meetings of clubs or organizations: Never    Relationship status: Not on file   Intimate partner violence    Fear of current or ex partner: Not on file    Emotionally abused: Not on file    Physically abused: Not on file    Forced sexual activity: Not on file  Other Topics Concern   Not on file  Social History Narrative   Born in [place], [place] and moved around a lot. Dad was an Art gallery manager and moved frequently.       Pertinent  Health Maintenance Due  Topic Date Due   URINE MICROALBUMIN  04/30/2017   HEMOGLOBIN A1C  02/17/2019   PNA vac Low Risk Adult (2 of 2 - PPSV23) 04/22/2019   FOOT EXAM  08/19/2019   OPHTHALMOLOGY EXAM  11/10/2019   COLONOSCOPY  10/20/2026   INFLUENZA VACCINE  Completed    Medications: Allergies as of 10/09/2019      Reactions   Naproxen Sodium Itching      Medication List       Accurate as of October 09, 2019 11:59 PM. If you have any questions, ask your nurse or doctor.        albuterol (2.5 MG/3ML) 0.083% nebulizer solution Commonly known as: PROVENTIL Take 3 mLs (2.5 mg total) by nebulization every 6 (six) hours as needed for wheezing or shortness of breath.   albuterol (2.5  MG/3ML) 0.083% nebulizer solution Commonly known as: PROVENTIL Take 3 mLs (2.5 mg total) by nebulization every 8 (eight) hours.   albuterol 108 (90 Base) MCG/ACT inhaler Commonly known as: VENTOLIN HFA Inhale 2 puffs into the lungs every 6 (six) hours as needed for wheezing or shortness of breath.   albuterol (2.5 MG/3ML) 0.083% nebulizer solution Commonly known as: PROVENTIL Take 3 mLs (2.5 mg total) by nebulization every 6 (six) hours as needed for wheezing or shortness of breath.   albuterol (2.5 MG/3ML) 0.083% nebulizer solution Commonly known as: PROVENTIL Take 3 mLs (2.5 mg total) by nebulization every 8 (eight) hours.   budesonide-formoterol 160-4.5 MCG/ACT inhaler Commonly known as: Symbicort  INHALE 2 PUFFS FIRST THING IN THE MORNING AND ANOTHER 2 PUFFS ABOUT 12 HOURS LATER   fluticasone 50 MCG/ACT nasal spray Commonly known as: FLONASE Place 1 spray into both nostrils daily.   guaiFENesin 600 MG 12 hr tablet Commonly known as: MUCINEX Take 2 tablets (1,200 mg total) by mouth 2 (two) times daily.   montelukast 10 MG tablet Commonly known as: SINGULAIR TAKE 1 TABLET(10 MG) BY MOUTH AT BEDTIME   MULTI VITAMIN PO Take 1 tablet by mouth daily.   OXYGEN Inhale 2 L/min into the lungs as needed. For sats less than 90%   polyethylene glycol 17 g packet Commonly known as: MIRALAX / GLYCOLAX Take 17 g by mouth daily.   predniSONE 10 MG tablet Commonly known as: DELTASONE Take 1 tablet (10 mg total) by mouth daily.   predniSONE 10 MG tablet Commonly known as: DELTASONE Take 1 tablet (10 mg total) by mouth daily.   rivaroxaban 20 MG Tabs tablet Commonly known as: Xarelto TAKE 1 TABLET(20 MG) BY MOUTH EVERY EVENING   rivaroxaban 20 MG Tabs tablet Commonly known as: Xarelto TAKE 1 TABLET(20 MG) BY MOUTH EVERY EVENING   sodium chloride 0.65 % Soln nasal spray Commonly known as: OCEAN Place 1 spray into both nostrils 4 (four) times daily as needed for congestion.    Spiriva HandiHaler 18 MCG inhalation capsule Generic drug: tiotropium INHALE THE CONTENTS OF 1 CAPSULE VIA INHALATION DEVICE EVERY DAY   Spiriva HandiHaler 18 MCG inhalation capsule Generic drug: tiotropium INHALE THE CONTENTS OF 1 CAPSULE VIA INHALATION DEVICE EVERY DAY   triamterene-hydrochlorothiazide 37.5-25 MG tablet Commonly known as: MAXZIDE-25 Take 1 tablet by mouth daily.   triamterene-hydrochlorothiazide 37.5-25 MG tablet Commonly known as: MAXZIDE-25 Take 1 tablet by mouth daily.        Vitals:   10/14/19 1958  BP: 130/85  Pulse: 69  Resp: 19  Temp: (!) 97.3 F (36.3 C)  Height:  (1.93 m)   Body mass index is 25.78 kg/m.  Physical Exam  GENERAL APPEARANCE: Alert, conversant. No acute distress.  HEENT: Unremarkable. RESPIRATORY: Breathing is even, unlabored. Lung sounds are clear   CARDIOVASCULAR: Heart RRR no murmurs, rubs or gallops. No peripheral edema.  GASTROINTESTINAL: Abdomen is soft, non-tender, not distended w/ normal bowel sounds.  NEUROLOGIC: Cranial nerves 2-12 grossly intact. Moves all extremities   Labs reviewed: Basic Metabolic Panel: Recent Labs    09/16/19 0442 09/17/19 0506 09/18/19 0432 09/20/19  NA 140 140 136 137  K 3.5 3.5 3.3* 3.7  CL 96* 90* 91* 91*  CO2 34* 36* 32 37*  GLUCOSE 95 108* 99  --   BUN 22 28* 27* 20  CREATININE 0.98 1.07 0.94 1.0  CALCIUM 8.6* 9.4 8.9 9.0  MG 2.0 1.9 1.8  --    Lab Results  Component Value Date   MICROALBUR 0.8 04/30/2016   Liver Function Tests: Recent Labs    07/05/19 1107 08/31/19 0321 09/01/19 0309  AST ALT ALKPHOS 63 54 47  BILITOT 0.5 0.6 0.8  PROT 6.9 6.7 6.2*  ALBUMIN 4.1 3.7 3.3*   No results for input(s): LIPASE, AMYLASE in the last 8760 hours. No results for input(s): AMMONIA in the last 8760 hours. CBC: Recent Labs    08/30/19 2339  08/31/19 0321 09/01/19 0309  09/12/19 0212 09/13/19 0207 09/14/19 0513  WBC 12.3*  --  14.8* 16.4*    < > 15.8* 14.5* 12.7*  NEUTROABS 9.1*  --  13.9* 14.9*  --   --   --   --   HGB 14.2   < > 14.1 12.7*   < > 11.5* 11.1* 11.9*  HCT 43.6   < > 44.1 38.9*   < > 38.1* 35.7* 38.0*  MCV 95.4  --  96.7 97.0   < > 103.8* 101.4* 99.7  PLT 408*  --  351 357   < > 300 260 281   < > = values in this interval not displayed.   Lipid Recent Labs    09/04/19 2339  TRIG 127   Cardiac Enzymes: No results for input(s): CKTOTAL, CKMB, CKMBINDEX, TROPONINI in the last 8760 hours. BNP: Recent Labs    08/30/19 2339  BNP 44.0   CBG: Recent Labs    09/16/19 0828 09/17/19 0800 09/18/19 0741  GLUCAP 90 92 88    Procedures and Imaging Studies During Stay: No results found.  Assessment/Plan:   Acute on chronic respiratory failure with hypoxia  COPD exacerbation  Pneumoperitoneum  Postop ileus  Hypertension  Paroxysmal atrial fibrillation  History of PE   Patient is being discharged with the following home health services: OT/PT  Patient is being discharged with the following durable medical equipment: Rolling walker, nebulizer  Patient has been advised to f/u with their PCP in 1-2 weeks to bring them up to date on their rehab stay.  Social services at facility was responsible for arranging this appointment.  Pt was provided with a 30 day supply of prescriptions for medications and refills must be obtained from their PCP.  For controlled substances, a more limited supply may be provided adequate until PCP appointment only.   Time spent greater than 30 minutes;> 50% of time with patient was spent reviewing records, labs, tests and studies, counseling and developing plan of care  Margit HanksAnne D Levita Monical MD

## 2019-10-10 MED ORDER — RIVAROXABAN 20 MG PO TABS: ORAL_TABLET | ORAL | 0 refills | Status: DC

## 2019-10-10 MED ORDER — SPIRIVA HANDIHALER 18 MCG IN CAPS: ORAL_CAPSULE | RESPIRATORY_TRACT | 0 refills | Status: DC

## 2019-10-10 MED ORDER — ALBUTEROL SULFATE (2.5 MG/3ML) 0.083% IN NEBU: 2.5000 mg | INHALATION_SOLUTION | Freq: Four times a day (QID) | RESPIRATORY_TRACT | 0 refills | Status: DC | PRN

## 2019-10-10 MED ORDER — TRIAMTERENE-HCTZ 37.5-25 MG PO TABS: 1.0000 | ORAL_TABLET | Freq: Every day | ORAL | 0 refills | Status: DC

## 2019-10-10 MED ORDER — SALINE SPRAY 0.65 % NA SOLN: 1.0000 | Freq: Four times a day (QID) | NASAL | 0 refills | Status: DC | PRN

## 2019-10-10 MED ORDER — ALBUTEROL SULFATE (2.5 MG/3ML) 0.083% IN NEBU: 2.5000 mg | INHALATION_SOLUTION | Freq: Three times a day (TID) | RESPIRATORY_TRACT | 0 refills | Status: DC

## 2019-10-10 MED ORDER — PREDNISONE 10 MG PO TABS: 10.0000 mg | ORAL_TABLET | Freq: Every day | ORAL | 0 refills | Status: DC

## 2019-10-11 ENCOUNTER — Telehealth: Payer: Self-pay | Admitting: *Deleted

## 2019-10-11 ENCOUNTER — Telehealth: Payer: Self-pay | Admitting: Internal Medicine

## 2019-10-11 NOTE — Telephone Encounter (Signed)
Rec'd msg off Patient Theodore [name] stating pt has been discharged 10/10/19 12:00 PM from [facility]. will call pt to follow-up and make hosp f/u if need.Marland KitchenJohny Chess

## 2019-10-11 NOTE — Telephone Encounter (Signed)
Given the verbal ok to start services

## 2019-10-11 NOTE — Telephone Encounter (Signed)
Tried calling pt to follow-up from d/c from rehab, but did not get an answer LMOM RTC...Theodore [name]

## 2019-10-11 NOTE — Telephone Encounter (Signed)
Home Health Verbal Orders - Caller/AgencyArville [name] with [facility] Callback Number: [phone] Requesting OT/PT/Skilled Nursing/Social Work/Speech Therapy: home based services Frequency: start services

## 2019-10-12 NOTE — Telephone Encounter (Signed)
Called Theodore [name] to verify d/c and a appt thats been made for Tues 10/17/19. Pt is aware of the appt. He states they made the appt before he left rehab. Inform pt had some additional question completed TCM questionare below.Theodore [name]  Transition Care Management Follow-up Telephone Call   Date discharged? 10/10/19 from SNF   How have you been since you were released from the hospital? Pt states he his doing alright. Almost back to himself. Still have cough, and now on 3 ml oxygen all day now   Do you understand why you were in the hospital? YES   Do you understand the discharge instructions? YES   Where were you discharged to? Home   Items Reviewed:  Medications reviewed: YES, no changes  Allergies reviewed: YES  Dietary changes reviewed: YES, heart healthy  Referrals reviewed: YES, pt states nurse w/[facility] should be coming out tomorrow   Functional Questionnaire:   Activities of Daily Living (ADLs):   He states he are independent in the following: ambulation, bathing and hygiene, feeding, continence, grooming, toileting and dressing States he doesn't require assistance. Just trying to get use to using oxygen all day.    Any transportation issues/concerns?: NO   Any patient concerns? NO   Confirmed importance and date/time of follow-up visits scheduled YES, appt 10/17/19  Provider Appointment booked with Dr. Jenny Reichmann  Confirmed with patient if condition begins to worsen call PCP or go to the ER.  Patient was given the office number and encouraged to call back with question or concerns.  : YES

## 2019-10-13 ENCOUNTER — Telehealth: Payer: Self-pay | Admitting: Internal Medicine

## 2019-10-13 DIAGNOSIS — I10 Essential (primary) hypertension: Secondary | ICD-10-CM | POA: Diagnosis not present

## 2019-10-13 DIAGNOSIS — Z86711 Personal history of pulmonary embolism: Secondary | ICD-10-CM | POA: Diagnosis not present

## 2019-10-13 DIAGNOSIS — Z9181 History of falling: Secondary | ICD-10-CM | POA: Diagnosis not present

## 2019-10-13 DIAGNOSIS — J9621 Acute and chronic respiratory failure with hypoxia: Secondary | ICD-10-CM | POA: Diagnosis not present

## 2019-10-13 DIAGNOSIS — I2510 Atherosclerotic heart disease of native coronary artery without angina pectoris: Secondary | ICD-10-CM | POA: Diagnosis not present

## 2019-10-13 DIAGNOSIS — J441 Chronic obstructive pulmonary disease with (acute) exacerbation: Secondary | ICD-10-CM | POA: Diagnosis not present

## 2019-10-13 DIAGNOSIS — F419 Anxiety disorder, unspecified: Secondary | ICD-10-CM | POA: Diagnosis not present

## 2019-10-13 DIAGNOSIS — K219 Gastro-esophageal reflux disease without esophagitis: Secondary | ICD-10-CM | POA: Diagnosis not present

## 2019-10-13 DIAGNOSIS — Z7901 Long term (current) use of anticoagulants: Secondary | ICD-10-CM | POA: Diagnosis not present

## 2019-10-13 DIAGNOSIS — Z9981 Dependence on supplemental oxygen: Secondary | ICD-10-CM | POA: Diagnosis not present

## 2019-10-13 DIAGNOSIS — I252 Old myocardial infarction: Secondary | ICD-10-CM | POA: Diagnosis not present

## 2019-10-13 DIAGNOSIS — I480 Paroxysmal atrial fibrillation: Secondary | ICD-10-CM | POA: Diagnosis not present

## 2019-10-13 DIAGNOSIS — M47819 Spondylosis without myelopathy or radiculopathy, site unspecified: Secondary | ICD-10-CM | POA: Diagnosis not present

## 2019-10-13 DIAGNOSIS — Z87891 Personal history of nicotine dependence: Secondary | ICD-10-CM | POA: Diagnosis not present

## 2019-10-13 DIAGNOSIS — R7303 Prediabetes: Secondary | ICD-10-CM | POA: Diagnosis not present

## 2019-10-13 NOTE — Telephone Encounter (Signed)
Copied from [place] [id number]. Topic: General - Other >> Oct 13, 2019  4:24 PM Keene Breath wrote: Reason for CRM: Called for verbal orders for home health PT - 2x wk 5,  Call back # if there are any questions [phone]

## 2019-10-14 ENCOUNTER — Encounter: Payer: Self-pay | Admitting: Internal Medicine

## 2019-10-16 NOTE — Telephone Encounter (Signed)
Verbal orders given  

## 2019-10-17 ENCOUNTER — Ambulatory Visit (INDEPENDENT_AMBULATORY_CARE_PROVIDER_SITE_OTHER): Payer: PPO | Admitting: Internal Medicine

## 2019-10-17 ENCOUNTER — Other Ambulatory Visit: Payer: Self-pay

## 2019-10-17 ENCOUNTER — Encounter: Payer: Self-pay | Admitting: Internal Medicine

## 2019-10-17 VITALS — BP 116/72 | HR 124 | Temp 98.3°F | Ht 76.0 in

## 2019-10-17 DIAGNOSIS — R7303 Prediabetes: Secondary | ICD-10-CM | POA: Diagnosis not present

## 2019-10-17 DIAGNOSIS — J9610 Chronic respiratory failure, unspecified whether with hypoxia or hypercapnia: Secondary | ICD-10-CM

## 2019-10-17 DIAGNOSIS — J189 Pneumonia, unspecified organism: Secondary | ICD-10-CM | POA: Diagnosis not present

## 2019-10-17 DIAGNOSIS — J449 Chronic obstructive pulmonary disease, unspecified: Secondary | ICD-10-CM | POA: Diagnosis not present

## 2019-10-17 LAB — POCT GLYCOSYLATED HEMOGLOBIN (HGB A1C): Hemoglobin A1C: 5.4 % (ref 4.0–5.6)

## 2019-10-17 MED ORDER — HYDROCORTISONE ACETATE 25 MG RE SUPP: 25.0000 mg | Freq: Two times a day (BID) | RECTAL | 1 refills | Status: DC

## 2019-10-17 MED ORDER — ALBUTEROL SULFATE HFA 108 (90 BASE) MCG/ACT IN AERS: 2.0000 | INHALATION_SPRAY | Freq: Four times a day (QID) | RESPIRATORY_TRACT | 11 refills | Status: DC | PRN

## 2019-10-17 NOTE — Assessment & Plan Note (Signed)
stable overall by history and exam, recent data reviewed with pt, and pt to continue medical treatment as before,  to f/u any worsening symptoms or concerns, now on 3L [place]

## 2019-10-17 NOTE — Progress Notes (Signed)
Subjective:    Patient ID: Theodore [name], male    DOB: 07-22-1956, 63 y.o.   MRN: [id number]  HPI   Here after recent hospn with [facility], COPD exacerbation with acute resp failure with home o2 increased from 2 to 3L continous [place], and pneumoperitoneum sp rehab stay, now Getting OT/PT/Skilled Nursing/Social Work/Speech Therapy: home based services.  Saw pulmonary with increased pred to 20 qd x 5 days about 3 wks ago; Denies worsening reflux, abd pain, dysphagia, n/v, bowel change but has had 2 small volume BRBPR episodes in the past wk, painless, no fever.  Last colonoscopy x 3 yrs with diverticulosis and internal hemorrhoid.  Pt denies new neurological symptoms such as new headache, or facial or extremity weakness or numbness   Pt denies polydipsia, polyuria Past Medical History:  Diagnosis Date  . Anxiety   . Arthritis    "back" (01/24/2014)  . Asthma   . Chronic bronchitis ([place])   . Cluster headache    "last bout was summer 2014; had them q spring 1991-2001" (01/24/2014)  . COPD (chronic obstructive pulmonary disease) ([place])   . GERD (gastroesophageal reflux disease)   . NSTEMI (non-ST elevated myocardial infarction) ([place]) 01/2014  . On home oxygen therapy    "2L; 24/7" (01/24/2014)  . Pneumonia 06/2013; 01/2014  . Pulmonary embolism ([place]) 01/23/2014  . Seasonal allergies    Past Surgical History:  Procedure Laterality Date  . COLONOSCOPY WITH PROPOFOL N/A 10/20/2016   Procedure: COLONOSCOPY WITH PROPOFOL;  Surgeon: Doran Stabler, MD;  Location: [facility];  Service: Gastroenterology;  Laterality: N/A;  . LAPAROTOMY N/A 09/04/2019   Procedure: EXPLORATORY LAPAROTOMY;  Surgeon: Alphonsa Overall, MD;  Location: [facility];  Service: General;  Laterality: N/A;  . NASAL SEPTOPLASTY W/ TURBINOPLASTY Left 1983  . TOTAL HIP ARTHROPLASTY Left 07/16/2017   Procedure: TOTAL HIP ARTHROPLASTY ANTERIOR APPROACH;  Surgeon: Leandrew Koyanagi, MD;  Location: [facility];  Service: Orthopedics;  Laterality: Left;  .  TURBINATE REDUCTION Bilateral ~ 1983    reports that he quit smoking about 7 years ago. His smoking use included cigarettes. He has a 70.00 pack-year smoking history. He has never used smokeless tobacco. He reports previous alcohol use. He reports that he does not use drugs. family history includes Asthma in his father and sister; Colon cancer in his cousin; Emphysema in his sister; Heart disease in his father and sister; High blood pressure in his mother; Leukemia in his father. Allergies  Allergen Reactions  . Naproxen Sodium Itching   Current Outpatient Medications on File Prior to Visit  Medication Sig Dispense Refill  . albuterol (PROVENTIL) (2.5 MG/3ML) 0.083% nebulizer solution Take 3 mLs (2.5 mg total) by nebulization every 6 (six) hours as needed for wheezing or shortness of breath. 360 mL 0  . albuterol (PROVENTIL) (2.5 MG/3ML) 0.083% nebulizer solution Take 3 mLs (2.5 mg total) by nebulization every 8 (eight) hours. 275 mL 0  . albuterol (PROVENTIL) (2.5 MG/3ML) 0.083% nebulizer solution Take 3 mLs (2.5 mg total) by nebulization every 6 (six) hours as needed for wheezing or shortness of breath. 360 mL 0  . albuterol (PROVENTIL) (2.5 MG/3ML) 0.083% nebulizer solution Take 3 mLs (2.5 mg total) by nebulization every 8 (eight) hours. 75 mL 0  . budesonide-formoterol (SYMBICORT) 160-4.5 MCG/ACT inhaler INHALE 2 PUFFS FIRST THING IN THE MORNING AND ANOTHER 2 PUFFS ABOUT 12 HOURS LATER 10.2 g 0  . fluticasone (FLONASE) 50 MCG/ACT nasal spray Place 1 spray into both nostrils  daily. 9.9 mL 0  . guaiFENesin (MUCINEX) 600 MG 12 hr tablet Take 2 tablets (1,200 mg total) by mouth 2 (two) times daily. 120 tablet 0  . montelukast (SINGULAIR) 10 MG tablet TAKE 1 TABLET(10 MG) BY MOUTH AT BEDTIME 30 tablet 0  . Multiple Vitamin (MULTI VITAMIN PO) Take 1 tablet by mouth daily.    . OXYGEN Inhale 2 L/min into the lungs as needed. For sats less than 90%    . polyethylene glycol (MIRALAX / GLYCOLAX) 17 g  packet Take 17 g by mouth daily.    . predniSONE (DELTASONE) 10 MG tablet Take 1 tablet (10 mg total) by mouth daily. 30 tablet 0  . predniSONE (DELTASONE) 10 MG tablet Take 1 tablet (10 mg total) by mouth daily. 30 tablet 0  . rivaroxaban (XARELTO) 20 MG TABS tablet TAKE 1 TABLET(20 MG) BY MOUTH EVERY EVENING 30 tablet 0  . rivaroxaban (XARELTO) 20 MG TABS tablet TAKE 1 TABLET(20 MG) BY MOUTH EVERY EVENING 30 tablet 0  . sodium chloride (OCEAN) 0.65 % SOLN nasal spray Place 1 spray into both nostrils 4 (four) times daily as needed for congestion. 15 mL 0  . tiotropium (SPIRIVA HANDIHALER) 18 MCG inhalation capsule INHALE THE CONTENTS OF 1 CAPSULE VIA INHALATION DEVICE EVERY DAY 30 capsule 0  . tiotropium (SPIRIVA HANDIHALER) 18 MCG inhalation capsule INHALE THE CONTENTS OF 1 CAPSULE VIA INHALATION DEVICE EVERY DAY 30 capsule 0  . triamterene-hydrochlorothiazide (MAXZIDE-25) 37.5-25 MG tablet Take 1 tablet by mouth daily. 30 tablet 0  . triamterene-hydrochlorothiazide (MAXZIDE-25) 37.5-25 MG tablet Take 1 tablet by mouth daily. 30 tablet 0   No current facility-administered medications on file prior to visit.    Review of Systems  Constitutional: Negative for other unusual diaphoresis or sweats HENT: Negative for ear discharge or swelling Eyes: Negative for other worsening visual disturbances Respiratory: Negative for stridor or other swelling  Gastrointestinal: Negative for worsening distension or other blood Genitourinary: Negative for retention or other urinary change Musculoskeletal: Negative for other MSK pain or swelling Skin: Negative for color change or other new lesions Neurological: Negative for worsening tremors and other numbness  Psychiatric/Behavioral: Negative for worsening agitation or other fatigue All otherwise neg per pt     Objective:   Physical Exam BP 116/72   Pulse (!) 124   Temp 98.3 F (36.8 C) (Oral)   Ht 6\' 4"  (1.93 m)   SpO2 99%   BMI 25.78 kg/m  VS  noted,  Constitutional: Pt appears in NAD HENT: Head: NCAT.  Right Ear: External ear normal.  Left Ear: External ear normal.  Eyes: . Pupils are equal, round, and reactive to light. Conjunctivae and EOM are normal Nose: without d/c or deformity Neck: Neck supple. Gross normal ROM Cardiovascular: Normal rate and regular rhythm.   Pulmonary/Chest: Effort normal and breath sounds decreased, no rales or wheezing.  Abd:  Soft, NT, ND, + BS, no organomegaly Neurological: Pt is alert. At baseline orientation, motor grossly intact Skin: Skin is warm. No rashes, other new lesions, no LE edema Psychiatric: Pt behavior is normal without agitation  All otherwise neg per pt  Lab Results  Component Value Date   WBC 12.7 (H) 09/14/2019   HGB 11.9 (L) 09/14/2019   HCT 38.0 (L) 09/14/2019   PLT 281 09/14/2019   GLUCOSE 99 09/18/2019   CHOL 215 (H) 08/18/2018   TRIG 127 09/04/2019   HDL 68.10 08/18/2018   LDLCALC 126 (H) 08/18/2018   ALT 15 09/01/2019  AST 21 09/01/2019   NA 137 09/20/2019   K 3.7 09/20/2019   CL 91 (A) 09/20/2019   CREATININE 1.0 09/20/2019   BUN 20 09/20/2019   CO2 37 (A) 09/20/2019   TSH 1.20 09/03/2014   PSA 0.74 08/18/2018   INR 1.15 07/16/2017   HGBA1C 5.6 08/18/2018   MICROALBUR 0.8 04/30/2016   POCT HgB A1C Order: [id number] Status:  Final result Visible to patient:  No (not released) Dx:  Pre-diabetes  Ref Range & Units 11:04 86yr ago 25yr ago 16yr ago 14yr ago 65yr ago  Hemoglobin A1C 4.0 - 5.6 % 5.4  5.6 R, CM  5.2 R  5.8 R, CM  5.9 R, CM  5.9High             Assessment & Plan:

## 2019-10-17 NOTE — Patient Instructions (Addendum)
Please take all new medication as prescribed - the suppositories as needed  Your A1c was OK today  Please continue all other medications as before, and refills have been done if requested.  Please have the pharmacy call with any other refills you may need.  Please continue your efforts at being more active, low cholesterol diet, and weight control.  Please keep your appointments with your specialists as you may have planned  Please return in 3 months, or sooner if needed

## 2019-10-17 NOTE — Assessment & Plan Note (Signed)
With exacerbation resolved,  to f/u any worsening symptoms or concerns

## 2019-10-17 NOTE — Assessment & Plan Note (Signed)
stable overall by history and exam, recent data reviewed with pt, and pt to continue medical treatment as before,  to f/u any worsening symptoms or concerns  

## 2019-10-17 NOTE — Assessment & Plan Note (Signed)
Clinically resolved,  to f/u any worsening symptoms or concerns 

## 2019-10-20 ENCOUNTER — Telehealth: Payer: Self-pay | Admitting: Internal Medicine

## 2019-10-20 NOTE — Telephone Encounter (Signed)
Verbal orders given  

## 2019-10-20 NOTE — Telephone Encounter (Signed)
Caller/Agency: Mallory Parcells/[facility] Number: [phone] ok to leave verbal on VM Requesting OT/PT/Skilled Nursing/Social Work/Speech Therapy: Home Health OT Frequency: Once a week for 5 weeks.

## 2019-10-25 DIAGNOSIS — J441 Chronic obstructive pulmonary disease with (acute) exacerbation: Secondary | ICD-10-CM | POA: Diagnosis not present

## 2019-10-25 DIAGNOSIS — I2510 Atherosclerotic heart disease of native coronary artery without angina pectoris: Secondary | ICD-10-CM | POA: Diagnosis not present

## 2019-10-25 DIAGNOSIS — I252 Old myocardial infarction: Secondary | ICD-10-CM | POA: Diagnosis not present

## 2019-10-25 DIAGNOSIS — Z87891 Personal history of nicotine dependence: Secondary | ICD-10-CM | POA: Diagnosis not present

## 2019-10-25 DIAGNOSIS — K219 Gastro-esophageal reflux disease without esophagitis: Secondary | ICD-10-CM | POA: Diagnosis not present

## 2019-10-25 DIAGNOSIS — Z86711 Personal history of pulmonary embolism: Secondary | ICD-10-CM

## 2019-10-25 DIAGNOSIS — M47819 Spondylosis without myelopathy or radiculopathy, site unspecified: Secondary | ICD-10-CM | POA: Diagnosis not present

## 2019-10-25 DIAGNOSIS — J9621 Acute and chronic respiratory failure with hypoxia: Secondary | ICD-10-CM | POA: Diagnosis not present

## 2019-10-25 DIAGNOSIS — I10 Essential (primary) hypertension: Secondary | ICD-10-CM | POA: Diagnosis not present

## 2019-10-25 DIAGNOSIS — I480 Paroxysmal atrial fibrillation: Secondary | ICD-10-CM | POA: Diagnosis not present

## 2019-10-25 DIAGNOSIS — R7303 Prediabetes: Secondary | ICD-10-CM | POA: Diagnosis not present

## 2019-10-25 DIAGNOSIS — F419 Anxiety disorder, unspecified: Secondary | ICD-10-CM | POA: Diagnosis not present

## 2019-10-25 DIAGNOSIS — Z7901 Long term (current) use of anticoagulants: Secondary | ICD-10-CM

## 2019-10-25 DIAGNOSIS — Z9981 Dependence on supplemental oxygen: Secondary | ICD-10-CM | POA: Diagnosis not present

## 2019-10-25 DIAGNOSIS — Z9181 History of falling: Secondary | ICD-10-CM

## 2019-10-26 DIAGNOSIS — Z9181 History of falling: Secondary | ICD-10-CM | POA: Diagnosis not present

## 2019-10-26 DIAGNOSIS — Z87891 Personal history of nicotine dependence: Secondary | ICD-10-CM | POA: Diagnosis not present

## 2019-10-26 DIAGNOSIS — J9621 Acute and chronic respiratory failure with hypoxia: Secondary | ICD-10-CM | POA: Diagnosis not present

## 2019-10-26 DIAGNOSIS — Z7901 Long term (current) use of anticoagulants: Secondary | ICD-10-CM | POA: Diagnosis not present

## 2019-10-26 DIAGNOSIS — J441 Chronic obstructive pulmonary disease with (acute) exacerbation: Secondary | ICD-10-CM | POA: Diagnosis not present

## 2019-10-26 DIAGNOSIS — K219 Gastro-esophageal reflux disease without esophagitis: Secondary | ICD-10-CM | POA: Diagnosis not present

## 2019-10-26 DIAGNOSIS — Z9981 Dependence on supplemental oxygen: Secondary | ICD-10-CM | POA: Diagnosis not present

## 2019-10-26 DIAGNOSIS — I2510 Atherosclerotic heart disease of native coronary artery without angina pectoris: Secondary | ICD-10-CM | POA: Diagnosis not present

## 2019-10-26 DIAGNOSIS — F419 Anxiety disorder, unspecified: Secondary | ICD-10-CM | POA: Diagnosis not present

## 2019-10-26 DIAGNOSIS — R7303 Prediabetes: Secondary | ICD-10-CM | POA: Diagnosis not present

## 2019-10-26 DIAGNOSIS — I252 Old myocardial infarction: Secondary | ICD-10-CM | POA: Diagnosis not present

## 2019-10-26 DIAGNOSIS — Z86711 Personal history of pulmonary embolism: Secondary | ICD-10-CM | POA: Diagnosis not present

## 2019-10-26 DIAGNOSIS — I480 Paroxysmal atrial fibrillation: Secondary | ICD-10-CM | POA: Diagnosis not present

## 2019-10-26 DIAGNOSIS — I10 Essential (primary) hypertension: Secondary | ICD-10-CM | POA: Diagnosis not present

## 2019-10-26 DIAGNOSIS — M47819 Spondylosis without myelopathy or radiculopathy, site unspecified: Secondary | ICD-10-CM | POA: Diagnosis not present

## 2019-10-30 ENCOUNTER — Ambulatory Visit: Payer: PPO | Admitting: Podiatry

## 2019-10-31 ENCOUNTER — Telehealth: Payer: Self-pay

## 2019-10-31 MED ORDER — HYDROXYZINE HCL 10 MG PO TABS: 10.0000 mg | ORAL_TABLET | Freq: Three times a day (TID) | ORAL | 2 refills | Status: DC | PRN

## 2019-10-31 NOTE — Telephone Encounter (Signed)
[place] for atarax prn - done  erx

## 2019-10-31 NOTE — Telephone Encounter (Signed)
Please advise 

## 2019-10-31 NOTE — Telephone Encounter (Signed)
Copied from [place] [id number]. Topic: General - Other >> Oct 31, 2019 11:37 AM Leward Quan A wrote: Reason for CRM: Sharyn Lull with [facility] called to inform Dr Jenny Reichmann that patient complains of anxiety with SOB and she is requesting orders for something to help with the anxiety to be sent to his pharmacy. Ph# [phone]

## 2019-11-01 NOTE — Telephone Encounter (Signed)
Pt informed of below.  

## 2019-11-09 ENCOUNTER — Ambulatory Visit (INDEPENDENT_AMBULATORY_CARE_PROVIDER_SITE_OTHER): Payer: PPO | Admitting: Pulmonary Disease

## 2019-11-09 ENCOUNTER — Encounter: Payer: Self-pay | Admitting: Pulmonary Disease

## 2019-11-09 ENCOUNTER — Other Ambulatory Visit: Payer: Self-pay

## 2019-11-09 DIAGNOSIS — J449 Chronic obstructive pulmonary disease, unspecified: Secondary | ICD-10-CM | POA: Diagnosis not present

## 2019-11-09 MED ORDER — FLUTICASONE PROPIONATE 50 MCG/ACT NA SUSP: 1.0000 | Freq: Every day | NASAL | 0 refills | Status: AC

## 2019-11-09 MED ORDER — SALINE SPRAY 0.65 % NA SOLN: 1.0000 | Freq: Four times a day (QID) | NASAL | 0 refills | Status: AC | PRN

## 2019-11-09 MED ORDER — RIVAROXABAN 20 MG PO TABS: ORAL_TABLET | ORAL | 0 refills | Status: DC

## 2019-11-09 MED ORDER — BUDESONIDE-FORMOTEROL FUMARATE 160-4.5 MCG/ACT IN AERO: INHALATION_SPRAY | RESPIRATORY_TRACT | 0 refills | Status: DC

## 2019-11-09 MED ORDER — ALBUTEROL SULFATE HFA 108 (90 BASE) MCG/ACT IN AERS: 2.0000 | INHALATION_SPRAY | Freq: Four times a day (QID) | RESPIRATORY_TRACT | 11 refills | Status: AC | PRN

## 2019-11-09 MED ORDER — ALBUTEROL SULFATE (2.5 MG/3ML) 0.083% IN NEBU: 2.5000 mg | INHALATION_SOLUTION | Freq: Four times a day (QID) | RESPIRATORY_TRACT | 0 refills | Status: DC | PRN

## 2019-11-09 MED ORDER — SPIRIVA HANDIHALER 18 MCG IN CAPS: ORAL_CAPSULE | RESPIRATORY_TRACT | 0 refills | Status: DC

## 2019-11-09 MED ORDER — MONTELUKAST SODIUM 10 MG PO TABS: ORAL_TABLET | ORAL | 0 refills | Status: DC

## 2019-11-09 MED ORDER — PREDNISONE 10 MG PO TABS: 10.0000 mg | ORAL_TABLET | Freq: Every day | ORAL | 0 refills | Status: DC

## 2019-11-09 NOTE — Progress Notes (Signed)
Virtual Visit via Telephone Note  I connected with Theodore [name] on 11/09/19 at 11:00 AM EST by telephone and verified that I am speaking with the correct person using two identifiers.  Location: Patient: Theodore [name] [facility] Provider: Josephine Igo, DO    I discussed the limitations, risks, security and privacy concerns of performing an evaluation and management service by telephone and the availability of in person appointments. I also discussed with the patient that there may be a patient responsible charge related to this service. The patient expressed understanding and agreed to proceed.   History of Present Illness:  This is a 63 year old gentleman with a past medical history of stage IV COPD, steroid dependence, history of pulmonary embolism formally followed by Dr. Sherene Sires and then followed by Dr. Kendrick Fries.  He smoked 3 packs a day for several years and ultimately quit in 2015.  He had a second lifetime pulmonary embolism in 2018 and was started on anticoagulation.  Carries approximately 70-pack-year history of smoking.  He was on azithromycin and this was stopped in 2019 due to concern of her arrhythmia side effects and this was a personal concern he did not actually have complications from this.  Telephone visit today to establish care with new primary pulmonary provider.  He has been seen by Buelah Manis in our clinic in November 2020.  He was admitted to the hospital in October 2020 through November 2020 for a COPD exacerbation respiratory failure, pneumoperitoneum status post exploratory laparotomy by Dr. Ezzard Standing.  He was difficult to extubate and remained on the ventilator for 2 days.  He was discharged to a skilled nursing facility.  At the time was receiving physical therapy and Occupational Therapy.  Currently COPD managed with Symbicort and Spiriva.    Overall patient today is doing well.  He does have daily cough and sputum production.  He has been taking his medications regularly.  He  does state that he slept all night last night.  He has not been using his I-S or flutter valve regularly.  He does state when he uses his flutter valve it helps clear the sputum.  His daily sputum production is approximately the same.  He does feel better in comparison to where he was in November at his last visit.  Patient denies hemoptysis fevers or weight loss.   Observations/Objective:  PFT Results Latest Ref Rng & Units 01/22/2017  FVC-Pre L 2.34  FVC-Predicted Pre % 40  FVC-Post L 2.60  FVC-Predicted Post % 45  Pre FEV1/FVC % % 30  Post FEV1/FCV % % 33  FEV1-Pre L 0.71  FEV1-Predicted Pre % 16  FEV1-Post L 0.85  DLCO UNC% % 37  DLCO COR %Predicted % 46    Echo 2018 normal LVEF mild PAH  Assessment and Plan:  Stage IV severe COPD Chronic hypoxemic respiratory failure History of pulmonary embolism x2 Plan: Continue Symbicort plus Spiriva Respimat Continue albuterol as needed for shortness of breath and wheezing. Continue Xarelto Continue Singulair daily Continue prednisone 10 mg daily  Follow Up Instructions:  Follow-up in clinic to see me in 6 months or as needed based on symptoms.   I discussed the assessment and treatment plan with the patient. The patient was provided an opportunity to ask questions and all were answered. The patient agreed with the plan and demonstrated an understanding of the instructions.   The patient was advised to call back or seek an in-person evaluation if the symptoms worsen or if the condition fails  to improve as anticipated.  I provided 16 minutes of non-face-to-face time during this encounter.   Garner Nash, DO

## 2019-11-09 NOTE — Addendum Note (Signed)
Addended by: Lia Foyer R on: 11/09/2019 11:59 AM   Modules accepted: Orders

## 2019-11-09 NOTE — Addendum Note (Signed)
Addended by: Lia Foyer R on: 11/09/2019 12:05 PM   Modules accepted: Orders

## 2019-11-09 NOTE — Patient Instructions (Addendum)
Thank you for visiting Dr. Valeta Harms at [facility]. Today we recommend the following:  We will send refills for all of your inhalers, Symbicort, Spiriva, albuterol Refills for albuterol nebulizer solution. Refills of 10 mg oral prednisone daily. Please call with any questions or concerns regarding your respiratory symptoms.  Return in about 6 months (around 05/08/2020).    Please do your part to reduce the spread of COVID-19.

## 2019-11-13 ENCOUNTER — Other Ambulatory Visit: Payer: Self-pay | Admitting: Internal Medicine

## 2019-11-15 DIAGNOSIS — K219 Gastro-esophageal reflux disease without esophagitis: Secondary | ICD-10-CM | POA: Diagnosis not present

## 2019-11-15 DIAGNOSIS — J441 Chronic obstructive pulmonary disease with (acute) exacerbation: Secondary | ICD-10-CM | POA: Diagnosis not present

## 2019-11-15 DIAGNOSIS — M47819 Spondylosis without myelopathy or radiculopathy, site unspecified: Secondary | ICD-10-CM | POA: Diagnosis not present

## 2019-11-15 DIAGNOSIS — Z86711 Personal history of pulmonary embolism: Secondary | ICD-10-CM | POA: Diagnosis not present

## 2019-11-15 DIAGNOSIS — I480 Paroxysmal atrial fibrillation: Secondary | ICD-10-CM | POA: Diagnosis not present

## 2019-11-15 DIAGNOSIS — R7303 Prediabetes: Secondary | ICD-10-CM | POA: Diagnosis not present

## 2019-11-15 DIAGNOSIS — Z9181 History of falling: Secondary | ICD-10-CM | POA: Diagnosis not present

## 2019-11-15 DIAGNOSIS — I252 Old myocardial infarction: Secondary | ICD-10-CM | POA: Diagnosis not present

## 2019-11-15 DIAGNOSIS — Z9981 Dependence on supplemental oxygen: Secondary | ICD-10-CM | POA: Diagnosis not present

## 2019-11-15 DIAGNOSIS — I10 Essential (primary) hypertension: Secondary | ICD-10-CM | POA: Diagnosis not present

## 2019-11-15 DIAGNOSIS — Z7901 Long term (current) use of anticoagulants: Secondary | ICD-10-CM | POA: Diagnosis not present

## 2019-11-15 DIAGNOSIS — F419 Anxiety disorder, unspecified: Secondary | ICD-10-CM | POA: Diagnosis not present

## 2019-11-15 DIAGNOSIS — Z87891 Personal history of nicotine dependence: Secondary | ICD-10-CM | POA: Diagnosis not present

## 2019-11-15 DIAGNOSIS — J9621 Acute and chronic respiratory failure with hypoxia: Secondary | ICD-10-CM | POA: Diagnosis not present

## 2019-11-15 DIAGNOSIS — I2510 Atherosclerotic heart disease of native coronary artery without angina pectoris: Secondary | ICD-10-CM | POA: Diagnosis not present

## 2019-12-12 ENCOUNTER — Other Ambulatory Visit: Payer: Self-pay

## 2019-12-12 MED ORDER — MONTELUKAST SODIUM 10 MG PO TABS: ORAL_TABLET | ORAL | 11 refills | Status: AC

## 2019-12-12 MED ORDER — RIVAROXABAN 20 MG PO TABS: ORAL_TABLET | ORAL | 3 refills | Status: DC

## 2019-12-12 MED ORDER — BUDESONIDE-FORMOTEROL FUMARATE 160-4.5 MCG/ACT IN AERO: INHALATION_SPRAY | RESPIRATORY_TRACT | 5 refills | Status: DC

## 2019-12-14 DIAGNOSIS — J9611 Chronic respiratory failure with hypoxia: Secondary | ICD-10-CM | POA: Diagnosis not present

## 2019-12-14 DIAGNOSIS — I2609 Other pulmonary embolism with acute cor pulmonale: Secondary | ICD-10-CM | POA: Diagnosis not present

## 2019-12-14 DIAGNOSIS — J9610 Chronic respiratory failure, unspecified whether with hypoxia or hypercapnia: Secondary | ICD-10-CM | POA: Diagnosis not present

## 2019-12-14 DIAGNOSIS — R0609 Other forms of dyspnea: Secondary | ICD-10-CM | POA: Diagnosis not present

## 2019-12-14 DIAGNOSIS — J449 Chronic obstructive pulmonary disease, unspecified: Secondary | ICD-10-CM | POA: Diagnosis not present

## 2020-01-09 ENCOUNTER — Other Ambulatory Visit: Payer: Self-pay | Admitting: Pulmonary Disease

## 2020-01-09 MED ORDER — ALBUTEROL SULFATE (2.5 MG/3ML) 0.083% IN NEBU: 2.5000 mg | INHALATION_SOLUTION | Freq: Four times a day (QID) | RESPIRATORY_TRACT | 5 refills | Status: AC | PRN

## 2020-01-15 ENCOUNTER — Ambulatory Visit: Payer: PPO | Admitting: Internal Medicine

## 2020-01-23 ENCOUNTER — Telehealth: Payer: Self-pay

## 2020-01-23 ENCOUNTER — Telehealth: Payer: Self-pay | Admitting: Pulmonary Disease

## 2020-01-23 NOTE — Telephone Encounter (Signed)
Spoke with Theodore [name], she wanted to let Dr. Tonia Brooms know about pt status. She is going to see him again in 2 weeks and just wanted to update BI. She states he is still coughing with yellow mucus and his SOB is increasing. Should we make him an appt to come into the office? She would like [place] to call her back if there are any recommendations so she can put it in her notes as well. BI please advise.

## 2020-01-23 NOTE — Telephone Encounter (Signed)
I called and spoke with the pt and have scheduled him for televisit with Beth 01/24/20 at 10:00 am

## 2020-01-23 NOTE — Telephone Encounter (Signed)
I dont think I have anything else, but he should see dental asap.  thanks

## 2020-01-23 NOTE — Telephone Encounter (Signed)
Yes, please set up telephone visit with APP. That has seen him last.  Looks like he has met [name] in the past. Can be seen this week if an opening? Garner Nash, DO [facility] 01/23/2020 4:06 PM

## 2020-01-24 ENCOUNTER — Encounter: Payer: Self-pay | Admitting: Primary Care

## 2020-01-24 ENCOUNTER — Ambulatory Visit (INDEPENDENT_AMBULATORY_CARE_PROVIDER_SITE_OTHER): Payer: PPO | Admitting: Primary Care

## 2020-01-24 ENCOUNTER — Other Ambulatory Visit: Payer: Self-pay

## 2020-01-24 DIAGNOSIS — J441 Chronic obstructive pulmonary disease with (acute) exacerbation: Secondary | ICD-10-CM

## 2020-01-24 MED ORDER — DOXYCYCLINE HYCLATE 100 MG PO TABS: 100.0000 mg | ORAL_TABLET | Freq: Two times a day (BID) | ORAL | 0 refills | Status: DC

## 2020-01-24 MED ORDER — PREDNISONE 10 MG PO TABS: ORAL_TABLET | ORAL | 0 refills | Status: DC

## 2020-01-24 NOTE — Progress Notes (Signed)
Virtual Visit via Telephone Note  I connected with Theodore [name] on 01/24/20 at 10:00 AM EDT by telephone and verified that I am speaking with the correct person using two identifiers.  Location: Patient: Home Provider: Home   I discussed the limitations, risks, security and privacy concerns of performing an evaluation and management service by telephone and the availability of in person appointments. I also discussed with the patient that there may be a patient responsible charge related to this service. The patient expressed understanding and agreed to proceed.   Synopsis: Has stage IV COPD (steroid dependent) and history of a pulmonary embolism after a hospitalization in 2015 formerly followed by Dr. Sherene Sires.  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 PMH significant for COPD GOLD IV, chronic respiratory failure, allergic rhinitis, CAD, PE, cardiac arrhythmia, MI, multiple pulmonary nodules, pre-diabetes, left total hip replacement. Patient of Dr. Kendrick Fries, last seen on 01/27/19. Maintained on Spiriva, Singulair, azithromycin, prednisone 10mg  daily. Chronic oxygen 2L on exertion. Recommended follow up in 3 months.   Previous [facility] pulmonary encounter: 07/05/2019 Patient presents today 3-5 month follow-up visit. Breathing is no worse but feels it is progressing. Chronic cough with mucus production. Not much color. Still taking azithromycin but not the last couple of days because he ran out. Compliant with Symbicort and Spiriva. Using nebulizer every 6 hours. Prednisone is at 10mg  daily. Not currently using oxygen. Has a concentrator at home but needs service.  Occasionally misses mucinex dosing.   09/28/2019 Admitted to the hospital from 10/21-11/9 for COPD exacerbation, acute respiratory failure and pneumoperitoneum postoperative ileus s/p exploratory laparotomy by Dr. 09/30/2019 without any findings. Difficult to extubate postoperatively and remained on ventilatory support for 48 hours before being extubated on 10/28. CXR showed hyperinflation and emphysema without acute findings. Covid negative. He was discharged to SNF. Patient is still at facility. Reports that he is doing better than he was. Receiving PT/OT and feels he is making improvements. Still feels tired. Using 3L oxygen. Has productive cough, getting up mucus is the morning. Taking 1,200mg  mucinex twice daily. Continues taking Symbicort and Spiriva as directed.      11/09/19- Dr. 11/28 Stage 4 severe COPD/chronic respiratory failure/PEx2 Medications refilled; continue Symbicort, Spiriva, Xarelto, singulair, prn albuterol and prednisone 10mg  daily. Follow-up 6 months.   01/24/2020 Patient contacted today for acute televisit. Reports home health nurse heard wheezing in lung fields. States that he has not noticed any increased wheezing himself. He has a chronic productive cough, no significant color. He has been slightly more short of breath over the last several days. He is compliant with Symb +Spiriva. Takes 1,200mg  mucinex twice daily.  Continues 10mg  chronic daily prednisone. Received second pfizer covid vaccine on march 8th.   Observations/Objective:  - Able to speak in full sentences, no respiratory distress or overt wheezing - Moderate cough   Assessment and Plan:  COPD exacerbation - Increased shortness of breath and wheezing; associated productive cough - Plan: Take 20mg  prednisone x 5 days and RX Doxycycline  - Continue Mucinex 1,200mg  twice daily - Continue Symbicort and Spiriva   Follow Up Instructions:  - As needed if  symptoms do not improve or worsen    I discussed the  assessment and treatment plan with the patient. The patient was provided an opportunity to ask questions and all were answered. The patient agreed with the plan and demonstrated an understanding of the instructions.   The patient was advised to call back or seek an in-person evaluation if the symptoms worsen or if the condition fails to improve as anticipated.  I provided 18 minutes of non-face-to-face time during this encounter.   Martyn Ehrich, NP

## 2020-01-24 NOTE — Progress Notes (Signed)
PCCM: thanks for speaking with him  Josephine Igo, DO [place] [facility] 01/24/2020 2:59 PM

## 2020-01-24 NOTE — Patient Instructions (Signed)
COPD exacerbation - Increased shortness of breath and wheezing; associated productive cough - Plan: Take 20mg  prednisone x 5 days and RX Doxycycline  - Continue Mucinex 1,200mg  twice daily - Continue Symbicort and Spiriva

## 2020-01-24 NOTE — Telephone Encounter (Signed)
Spoke with patient and info given 

## 2020-01-26 NOTE — Progress Notes (Signed)
PCCM: Agree. Thanks for speaking with him Josephine Igo, DO [place] [facility] 01/26/2020 11:29 AM

## 2020-01-29 DIAGNOSIS — J9610 Chronic respiratory failure, unspecified whether with hypoxia or hypercapnia: Secondary | ICD-10-CM | POA: Diagnosis not present

## 2020-01-29 DIAGNOSIS — J449 Chronic obstructive pulmonary disease, unspecified: Secondary | ICD-10-CM | POA: Diagnosis not present

## 2020-01-29 DIAGNOSIS — J9611 Chronic respiratory failure with hypoxia: Secondary | ICD-10-CM | POA: Diagnosis not present

## 2020-01-29 DIAGNOSIS — I2609 Other pulmonary embolism with acute cor pulmonale: Secondary | ICD-10-CM | POA: Diagnosis not present

## 2020-01-29 DIAGNOSIS — R0609 Other forms of dyspnea: Secondary | ICD-10-CM | POA: Diagnosis not present

## 2020-01-31 ENCOUNTER — Other Ambulatory Visit: Payer: Self-pay | Admitting: Internal Medicine

## 2020-01-31 DIAGNOSIS — I10 Essential (primary) hypertension: Secondary | ICD-10-CM

## 2020-02-14 ENCOUNTER — Other Ambulatory Visit: Payer: Self-pay | Admitting: Emergency Medicine

## 2020-02-14 MED ORDER — PREDNISONE 10 MG PO TABS: 10.0000 mg | ORAL_TABLET | Freq: Every day | ORAL | 1 refills | Status: DC

## 2020-02-29 DIAGNOSIS — J9611 Chronic respiratory failure with hypoxia: Secondary | ICD-10-CM | POA: Diagnosis not present

## 2020-02-29 DIAGNOSIS — R0609 Other forms of dyspnea: Secondary | ICD-10-CM | POA: Diagnosis not present

## 2020-02-29 DIAGNOSIS — J9610 Chronic respiratory failure, unspecified whether with hypoxia or hypercapnia: Secondary | ICD-10-CM | POA: Diagnosis not present

## 2020-02-29 DIAGNOSIS — I2609 Other pulmonary embolism with acute cor pulmonale: Secondary | ICD-10-CM | POA: Diagnosis not present

## 2020-02-29 DIAGNOSIS — J449 Chronic obstructive pulmonary disease, unspecified: Secondary | ICD-10-CM | POA: Diagnosis not present

## 2020-03-24 ENCOUNTER — Other Ambulatory Visit: Payer: Self-pay | Admitting: Internal Medicine

## 2020-03-30 DIAGNOSIS — R0609 Other forms of dyspnea: Secondary | ICD-10-CM | POA: Diagnosis not present

## 2020-03-30 DIAGNOSIS — I2609 Other pulmonary embolism with acute cor pulmonale: Secondary | ICD-10-CM | POA: Diagnosis not present

## 2020-03-30 DIAGNOSIS — J9611 Chronic respiratory failure with hypoxia: Secondary | ICD-10-CM | POA: Diagnosis not present

## 2020-03-30 DIAGNOSIS — J449 Chronic obstructive pulmonary disease, unspecified: Secondary | ICD-10-CM | POA: Diagnosis not present

## 2020-03-30 DIAGNOSIS — J9610 Chronic respiratory failure, unspecified whether with hypoxia or hypercapnia: Secondary | ICD-10-CM | POA: Diagnosis not present

## 2020-04-02 ENCOUNTER — Telehealth: Payer: Self-pay | Admitting: Pulmonary Disease

## 2020-04-02 NOTE — Telephone Encounter (Signed)
Left message for Raynelle Fanning to call back tomorrow.

## 2020-04-02 NOTE — Telephone Encounter (Signed)
Called [facility] to speak with Raynelle Fanning but unable to reach. Left message for her to return call.

## 2020-04-02 NOTE — Telephone Encounter (Signed)
Per Tammy, the pt will need appt, or go to ED sooner for eval if acutely ill

## 2020-04-02 NOTE — Telephone Encounter (Signed)
Spoke with Raynelle Fanning. She stated that she had a visit with the patient earlier this afternoon. She listened to his chest and heard bilateral wheezing. She also noticed that his right lower leg is swollen. He is also more SOB with the slightest bit of exertion. He is currently on 2L. She denied any history of fevers or body aches. Does have a productive cough but the mucus is clear.   He is still using his Spiriva, Symbicort and prednisone daily.   She wanted to know if we had any recommendations for him. I advised her that BI is currently out of the office but I would ask one of our NPs. She verbalized understanding.   Pharmacy is [facility] on [address].   TP, please advise. Thanks!

## 2020-04-03 NOTE — Telephone Encounter (Addendum)
Called and spoke with Raynelle Fanning stating to her that pt needs to schedule an appt but if he is acutely ill, he might need an ED visit. Raynelle Fanning stated that she would call pt to see how he is feeling today and if he wanted to schedule an appt at our office instead of going to the ED, stated to her to have pt call the office to get appt scheduled. Raynelle Fanning verbalized understanding.

## 2020-04-03 NOTE — Telephone Encounter (Signed)
Attempted to call Theodore [name] but unable to reach. Left message for her to return call.

## 2020-04-03 NOTE — Telephone Encounter (Signed)
Theodore [name] called back. Please return call-- she says she's free for the rest of the afternoon

## 2020-04-03 NOTE — Telephone Encounter (Signed)
Theodore [name] called back Please return call [phone] anytime this afternoon.

## 2020-04-18 ENCOUNTER — Other Ambulatory Visit: Payer: Self-pay | Admitting: *Deleted

## 2020-04-18 MED ORDER — PREDNISONE 10 MG PO TABS: 10.0000 mg | ORAL_TABLET | Freq: Every day | ORAL | 1 refills | Status: DC

## 2020-04-19 ENCOUNTER — Other Ambulatory Visit: Payer: Self-pay | Admitting: *Deleted

## 2020-04-24 ENCOUNTER — Other Ambulatory Visit: Payer: Self-pay | Admitting: Internal Medicine

## 2020-04-24 NOTE — Telephone Encounter (Signed)
Please refill as per office routine med refill policy (all routine meds refilled for 3 mo or monthly per pt preference up to one year from last visit, then month to month grace period for 3 mo, then further med refills will have to be denied)  

## 2020-04-25 ENCOUNTER — Other Ambulatory Visit: Payer: Self-pay

## 2020-04-25 MED ORDER — SPIRIVA HANDIHALER 18 MCG IN CAPS: ORAL_CAPSULE | RESPIRATORY_TRACT | 0 refills | Status: DC

## 2020-04-30 DIAGNOSIS — R0609 Other forms of dyspnea: Secondary | ICD-10-CM | POA: Diagnosis not present

## 2020-04-30 DIAGNOSIS — J9611 Chronic respiratory failure with hypoxia: Secondary | ICD-10-CM | POA: Diagnosis not present

## 2020-04-30 DIAGNOSIS — I2609 Other pulmonary embolism with acute cor pulmonale: Secondary | ICD-10-CM | POA: Diagnosis not present

## 2020-04-30 DIAGNOSIS — J449 Chronic obstructive pulmonary disease, unspecified: Secondary | ICD-10-CM | POA: Diagnosis not present

## 2020-04-30 DIAGNOSIS — J9610 Chronic respiratory failure, unspecified whether with hypoxia or hypercapnia: Secondary | ICD-10-CM | POA: Diagnosis not present

## 2020-05-07 ENCOUNTER — Other Ambulatory Visit: Payer: Self-pay

## 2020-05-07 ENCOUNTER — Telehealth: Payer: Self-pay | Admitting: Internal Medicine

## 2020-05-07 MED ORDER — HYDROXYZINE HCL 10 MG PO TABS: 10.0000 mg | ORAL_TABLET | Freq: Three times a day (TID) | ORAL | 2 refills | Status: AC | PRN

## 2020-05-07 NOTE — Telephone Encounter (Signed)
1.Medication Requested: triamterene-hydrochlorothiazide (MAXZIDE-25) 37.5-25 MG tablet  2. Pharmacy (Name, Street, [facility]): [facility] [phone] - [place], [place] - [address] AT [facility] OF [facility] [address] Phone:  [phone]  Fax:  [phone]       3. On Med List: Y  4. Last Visit with PCP: 10/17/2019  5. Next visit date with PCP: no date seen   Agent: Please be advised that RX refills may take up to 3 business days. We ask that you follow-up with your pharmacy.

## 2020-05-07 NOTE — Telephone Encounter (Signed)
Sent to pharmacy 

## 2020-05-09 ENCOUNTER — Other Ambulatory Visit: Payer: Self-pay | Admitting: Internal Medicine

## 2020-05-09 DIAGNOSIS — I10 Essential (primary) hypertension: Secondary | ICD-10-CM

## 2020-05-09 NOTE — Telephone Encounter (Signed)
    Patient requesting triamterene-hydrochlorothiazide (MAXZIDE-25) 37.5-25 MG tablet Pharmacy :[id number] - [place],  - [address] AT [facility] OF [facility] [address]

## 2020-05-09 NOTE — Telephone Encounter (Signed)
Please refill as per office routine med refill policy (all routine meds refilled for 3 mo or monthly per pt preference up to one year from last visit, then month to month grace period for 3 mo, then further med refills will have to be denied)  

## 2020-05-10 ENCOUNTER — Other Ambulatory Visit: Payer: Self-pay | Admitting: Internal Medicine

## 2020-05-10 DIAGNOSIS — I10 Essential (primary) hypertension: Secondary | ICD-10-CM

## 2020-05-10 MED ORDER — TRIAMTERENE-HCTZ 37.5-25 MG PO TABS: 1.0000 | ORAL_TABLET | Freq: Every day | ORAL | 0 refills | Status: DC

## 2020-05-10 NOTE — Telephone Encounter (Signed)
Pt contacted and informed that he was due for an appointment.   Appt scheduled and 30 day erx sent to pof.

## 2020-05-10 NOTE — Telephone Encounter (Signed)
Pt was to follow up in March.  °

## 2020-05-14 ENCOUNTER — Telehealth: Payer: Self-pay | Admitting: Pulmonary Disease

## 2020-05-14 MED ORDER — PREDNISONE 10 MG PO TABS: 10.0000 mg | ORAL_TABLET | Freq: Every day | ORAL | 2 refills | Status: DC

## 2020-05-14 NOTE — Telephone Encounter (Signed)
Spoke with pt and verified that he needs refills for Prednisone 10 mg tablets. Refill sent to [facility] on [address]. Nothing further needed.

## 2020-05-30 ENCOUNTER — Other Ambulatory Visit: Payer: Self-pay

## 2020-05-30 ENCOUNTER — Ambulatory Visit (INDEPENDENT_AMBULATORY_CARE_PROVIDER_SITE_OTHER): Payer: PPO | Admitting: Primary Care

## 2020-05-30 ENCOUNTER — Encounter: Payer: Self-pay | Admitting: Primary Care

## 2020-05-30 DIAGNOSIS — Z87891 Personal history of nicotine dependence: Secondary | ICD-10-CM

## 2020-05-30 DIAGNOSIS — J9611 Chronic respiratory failure with hypoxia: Secondary | ICD-10-CM | POA: Diagnosis not present

## 2020-05-30 DIAGNOSIS — I2699 Other pulmonary embolism without acute cor pulmonale: Secondary | ICD-10-CM

## 2020-05-30 DIAGNOSIS — J449 Chronic obstructive pulmonary disease, unspecified: Secondary | ICD-10-CM | POA: Diagnosis not present

## 2020-05-30 DIAGNOSIS — R0609 Other forms of dyspnea: Secondary | ICD-10-CM | POA: Diagnosis not present

## 2020-05-30 DIAGNOSIS — J9610 Chronic respiratory failure, unspecified whether with hypoxia or hypercapnia: Secondary | ICD-10-CM | POA: Diagnosis not present

## 2020-05-30 DIAGNOSIS — I2609 Other pulmonary embolism with acute cor pulmonale: Secondary | ICD-10-CM | POA: Diagnosis not present

## 2020-05-30 MED ORDER — RIVAROXABAN 20 MG PO TABS: ORAL_TABLET | ORAL | 3 refills | Status: DC

## 2020-05-30 NOTE — Patient Instructions (Signed)
COPD: - Continues Symbicort 160 +Spiriva Handihaler; prn albuterol and prednisone 10mg  daily  - Consider restarting azithromycin MWF at next visit. Will need ECG at visit.  Chronic respiratory failure - Continues to benefit from wearing 2L oxygen 24/7 - Needs oxygen re-certification prior to September 21st 2021  Pulmonary embolism - Hx recurrent PE, continue Xarelto 20mg  daily  Former smoker: - Quit all together in 2015, he has a 70 pack year hx - Refer to lung cancer screening clinic   Follow Up Instructions:  - FU in 8 weeks for oxygen re-certification

## 2020-05-30 NOTE — Progress Notes (Signed)
Virtual Visit via Telephone Note  I connected with Theodore [name] on 05/30/20 at  9:30 AM EDT by telephone and verified that I am speaking with the correct person using two identifiers.  Location: Patient: Home Provider: Office   I discussed the limitations, risks, security and privacy concerns of performing an evaluation and management service by telephone and the availability of in person appointments. I also discussed with the patient that there may be a patient responsible charge related to this service. The patient expressed understanding and agreed to proceed.   History of Present Illness:  Synopsis: Has stage IV COPD (steroid dependent) and history of a pulmonary embolism after a hospitalization in 2015 formerly followed by Dr. Sherene Sires.  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 PMH significant for COPD GOLD IV, chronic respiratory failure, allergic rhinitis, CAD, PE, cardiac arrhythmia, MI, multiple pulmonary nodules, pre-diabetes, left total hip replacement. Patient of Dr. Tonia Brooms. Maintained on Spiriva, Singulair, prednisone 10mg  daily. Chronic oxygen 2L on exertion.   Previous [facility] pulmonary encounter: 07/05/2019 Patient presents today 3-5 month follow-up visit. Breathing is no worse but feels it is progressing. Chronic cough with mucus production. Not much color. Still taking azithromycin but not the last couple of days because he ran out. Compliant with Symbicort and Spiriva. Using nebulizer every 6 hours. Prednisone is at 10mg  daily. Not currently using oxygen. Has a concentrator at home but needs service. Occasionally misses mucinex dosing.    09/28/2019 Admitted to the hospital from 10/21-11/9 for COPD exacerbation, acute respiratory failure and pneumoperitoneum postoperative ileus s/p exploratory laparotomy by Dr. 09/30/2019 without any findings. Difficult to extubate postoperatively and remained on ventilatory support for 48 hours before being extubated on 10/28. CXR showed hyperinflation and emphysema without acute findings. Covid negative. He was discharged to SNF. Patient is still at facility. Reports that he is doing better than he was. Receiving PT/OT and feels he is making improvements. Still feels tired. Using 3L oxygen. Has productive cough, getting up mucus is the morning. Taking 1,200mg  mucinex twice daily. Continues taking Symbicort and Spiriva as directed.      11/09/19- Dr. 11/28 Stage 4 severe COPD/chronic respiratory failure/PEx2 Medications refilled; continue Symbicort, Spiriva, Xarelto, singulair, prn albuterol and prednisone 10mg  daily. Follow-up 6 months.   01/24/2020 Patient contacted today for acute televisit. Reports home health nurse heard wheezing in lung fields. States that he has not noticed any increased wheezing himself. He has a chronic productive cough, no significant color. He has been slightly more short of breath over the last several days. He is compliant with Symb +Spiriva. Takes 1,200mg  mucinex twice daily.  Continues 10mg  chronic daily prednisone. Received second pfizer covid vaccine on march 8th.   05/30/2020- interim hx Patient contacted virtually today for regular follow-up. His breathing is baseline. Reports dyspnea with mild exertion. States that he can not walk more than a few feet without getting short of breath. He is compliant with Symbicort and Spiriva. On average he uses Albuterol nebulizer every 6 hours, states that using his nebulizer prior to bedtime helps him sleep better. He has usual cough which is productive. Mucus is clear. He  has not had recent exacerbations requiring antibiotics. He is  on prednisone 10mg  daily. He remains on 2L oxygen. He was on daily azithromycin for 6 months back in 2020 predscribed by Dr. 2021. He is not taking azithromycin daily anymore. He is open to restarting this as he feels it did help reduce exacerbations.   Observations/Objective:  - Able to speak in full sentences - He has a congested cough  Assessment and Plan:  COPD GOLD IV - Appears at his baseline, no recent exacerbations since March 2021 - Continues Symbicort 160 +Spiriva Handihaler; prn albuterol and prednisone 10mg  daily  - Consider restarting azithromycin MWF at next visit. He will need ECG at visit.  Chronic respiratory failure - Continues to benefit from wearing 2L oxygen 24/7 - Needs oxygen re-certification prior to September 21st 2021  Pulmonary embolism - Hx recurrent PE, continue Xarelto 20mg  daily  Former smoker: - Quit all together in 2015, he has a 70 pack year hx - Refer to lung cancer screening clinic   Follow Up Instructions:  - FU in 8 weeks for oxygen re-certification    I discussed the assessment and treatment plan with the patient. The patient was provided an opportunity to ask questions and all were answered. The patient agreed with the plan and demonstrated an understanding of the instructions.   The patient was advised to call back or seek an in-person evaluation if the symptoms worsen or if the condition fails to improve as anticipated.  I provided 25 minutes of non-face-to-face time during this encounter.   07-11-1979, NP

## 2020-05-30 NOTE — Addendum Note (Signed)
Addended by: Charlott Holler on: 05/30/2020 10:53 AM   Modules accepted: Orders

## 2020-06-03 ENCOUNTER — Other Ambulatory Visit (INDEPENDENT_AMBULATORY_CARE_PROVIDER_SITE_OTHER): Payer: PPO

## 2020-06-03 ENCOUNTER — Encounter: Payer: Self-pay | Admitting: Internal Medicine

## 2020-06-03 ENCOUNTER — Ambulatory Visit (INDEPENDENT_AMBULATORY_CARE_PROVIDER_SITE_OTHER): Payer: PPO | Admitting: Internal Medicine

## 2020-06-03 ENCOUNTER — Other Ambulatory Visit: Payer: Self-pay

## 2020-06-03 VITALS — BP 100/68 | HR 102 | Temp 98.9°F | Ht 76.0 in

## 2020-06-03 DIAGNOSIS — Z0000 Encounter for general adult medical examination without abnormal findings: Secondary | ICD-10-CM | POA: Diagnosis not present

## 2020-06-03 DIAGNOSIS — E559 Vitamin D deficiency, unspecified: Secondary | ICD-10-CM | POA: Diagnosis not present

## 2020-06-03 DIAGNOSIS — E538 Deficiency of other specified B group vitamins: Secondary | ICD-10-CM | POA: Diagnosis not present

## 2020-06-03 DIAGNOSIS — Z0001 Encounter for general adult medical examination with abnormal findings: Secondary | ICD-10-CM

## 2020-06-03 DIAGNOSIS — J441 Chronic obstructive pulmonary disease with (acute) exacerbation: Secondary | ICD-10-CM

## 2020-06-03 DIAGNOSIS — F411 Generalized anxiety disorder: Secondary | ICD-10-CM

## 2020-06-03 DIAGNOSIS — J9611 Chronic respiratory failure with hypoxia: Secondary | ICD-10-CM | POA: Diagnosis not present

## 2020-06-03 DIAGNOSIS — R7303 Prediabetes: Secondary | ICD-10-CM | POA: Diagnosis not present

## 2020-06-03 LAB — CBC WITH DIFFERENTIAL/PLATELET
Basophils Absolute: 0.1 10*3/uL (ref 0.0–0.1)
Basophils Relative: 0.5 % (ref 0.0–3.0)
Eosinophils Absolute: 0.1 10*3/uL (ref 0.0–0.7)
Eosinophils Relative: 0.5 % (ref 0.0–5.0)
HCT: 39.9 % (ref 39.0–52.0)
Hemoglobin: 13.6 g/dL (ref 13.0–17.0)
Lymphocytes Relative: 8.5 % — ABNORMAL LOW (ref 12.0–46.0)
Lymphs Abs: 1 10*3/uL (ref 0.7–4.0)
MCHC: 34 g/dL (ref 30.0–36.0)
MCV: 90.9 fl (ref 78.0–100.0)
Monocytes Absolute: 0.8 10*3/uL (ref 0.1–1.0)
Monocytes Relative: 6.4 % (ref 3.0–12.0)
Neutro Abs: 10.2 10*3/uL — ABNORMAL HIGH (ref 1.4–7.7)
Neutrophils Relative %: 84.1 % — ABNORMAL HIGH (ref 43.0–77.0)
Platelets: 398 10*3/uL (ref 150.0–400.0)
RBC: 4.39 Mil/uL (ref 4.22–5.81)
RDW: 14.4 % (ref 11.5–15.5)
WBC: 12.1 10*3/uL — ABNORMAL HIGH (ref 4.0–10.5)

## 2020-06-03 LAB — COMPLETE METABOLIC PANEL WITH GFR
AG Ratio: 1.6 (calc) (ref 1.0–2.5)
ALT: 14 U/L (ref 9–46)
AST: 16 U/L (ref 10–35)
Albumin: 4 g/dL (ref 3.6–5.1)
Alkaline phosphatase (APISO): 66 U/L (ref 35–144)
BUN: 11 mg/dL (ref 7–25)
CO2: 26 mmol/L (ref 20–32)
Calcium: 8.9 mg/dL (ref 8.6–10.3)
Chloride: 93 mmol/L — ABNORMAL LOW (ref 98–110)
Creat: 0.85 mg/dL (ref 0.70–1.25)
GFR, Est African American: 105 mL/min/{1.73_m2} (ref >=60)
GFR, Est Non African American: 91 mL/min/{1.73_m2} (ref >=60)
Globulin: 2.5 g/dL (calc) (ref 1.9–3.7)
Glucose, Bld: 114 mg/dL — ABNORMAL HIGH (ref 65–99)
Potassium: 4.2 mmol/L (ref 3.5–5.3)
Sodium: 130 mmol/L — ABNORMAL LOW (ref 135–146)
Total Bilirubin: 0.6 mg/dL (ref 0.2–1.2)
Total Protein: 6.5 g/dL (ref 6.1–8.1)

## 2020-06-03 MED ORDER — CITALOPRAM HYDROBROMIDE 20 MG PO TABS
20.0000 mg | ORAL_TABLET | Freq: Every day | ORAL | 3 refills | Status: DC
Start: 2020-06-03 — End: 2020-10-11

## 2020-06-03 NOTE — Progress Notes (Signed)
Subjective:    Patient ID: Theodore [name], male    DOB: [birth year], 64 y.o.   MRN: [id number]  HPI  Here for wellness and f/u;  Overall doing ok;  Pt denies Chest pain, orthopnea, PND, worsening LE edema, palpitations, dizziness or syncope, but has had very mild sob/doe/wheezing in the past 3 days, without fever, CP.Marland Kitchen  Pt denies neurological change such as new headache, facial or extremity weakness.  Pt denies polydipsia, polyuria, or low sugar symptoms. Pt states overall good compliance with treatment and medications, good tolerability, and has been trying to follow appropriate diet.  Pt denies worsening depressive symptoms, suicidal ideation or panic. No fever, night sweats, wt loss, loss of appetite, or other constitutional symptoms.  Pt states good ability with ADL's, has low fall risk, home safety reviewed and adequate, no other significant changes in hearing or vision.   Living at home alone, has helper for cleaning, cooks with microwave, and has a bath about 1 per wk but mostly does self baths, has a shower bench,  Toilets ok.  Last fall was a trip and fall over his o2 tubing a few months ago without injury.   Pt denies fever, wt loss, night sweats, loss of appetite, or other constitutional symptoms.  Has some ongoing anxiety but coping ok but not always and mostly uncontrolled, asks for tx..  On home o2 2L [place] continuous. Has not seen optho due to covid.  Past Medical History:  Diagnosis Date  . Anxiety   . Arthritis    "back" (01/24/2014)  . Asthma   . Chronic bronchitis (HCC)   . Cluster headache    "last bout was summer 2014; had them q spring 1991-2001" (01/24/2014)  . COPD (chronic obstructive pulmonary disease) (HCC)   . GERD (gastroesophageal reflux disease)   . NSTEMI (non-ST elevated myocardial infarction) (HCC) 01/2014  . On home oxygen therapy    "2L; 24/7" (01/24/2014)  . Pneumonia 06/2013; 01/2014  . Pulmonary embolism (HCC) 01/23/2014  . Seasonal allergies    Past Surgical  History:  Procedure Laterality Date  . COLONOSCOPY WITH PROPOFOL N/A 10/20/2016   Procedure: COLONOSCOPY WITH PROPOFOL;  Surgeon: Sherrilyn Rist, MD;  Location: [facility];  Service: Gastroenterology;  Laterality: N/A;  . LAPAROTOMY N/A 09/04/2019   Procedure: EXPLORATORY LAPAROTOMY;  Surgeon: Ovidio Kin, MD;  Location: [facility];  Service: General;  Laterality: N/A;  . NASAL SEPTOPLASTY W/ TURBINOPLASTY Left 1983  . TOTAL HIP ARTHROPLASTY Left 07/16/2017   Procedure: TOTAL HIP ARTHROPLASTY ANTERIOR APPROACH;  Surgeon: Tarry Kos, MD;  Location: [facility] OR;  Service: Orthopedics;  Laterality: Left;  . TURBINATE REDUCTION Bilateral ~ 1983    reports that he quit smoking about 8 years ago. His smoking use included cigarettes. He has a 70.00 pack-year smoking history. He has never used smokeless tobacco. He reports previous alcohol use. He reports that he does not use drugs. family history includes Asthma in his father and sister; Colon cancer in his cousin; Emphysema in his sister; Heart disease in his father and sister; High blood pressure in his mother; Leukemia in his father. Allergies  Allergen Reactions  . Naproxen Sodium Itching   Current Outpatient Medications on File Prior to Visit  Medication Sig Dispense Refill  . albuterol (PROVENTIL) (2.5 MG/3ML) 0.083% nebulizer solution Take 3 mLs (2.5 mg total) by nebulization every 6 (six) hours as needed for wheezing or shortness of breath. 360 mL 5  . albuterol (VENTOLIN HFA) 108 (90  Base) MCG/ACT inhaler Inhale 2 puffs into the lungs every 6 (six) hours as needed for wheezing or shortness of breath. 8 g 11  . budesonide-formoterol (SYMBICORT) 160-4.5 MCG/ACT inhaler INHALE 2 PUFFS FIRST THING IN THE MORNING AND ANOTHER 2 PUFFS ABOUT 12 HOURS LATER 10.2 g 5  . fluticasone (FLONASE) 50 MCG/ACT nasal spray Place 1 spray into both nostrils daily. 9.9 mL 0  . guaiFENesin (MUCINEX) 600 MG 12 hr tablet Take 2 tablets (1,200 mg total) by mouth 2  (two) times daily. 120 tablet 0  . hydrOXYzine (ATARAX/VISTARIL) 10 MG tablet Take 1 tablet (10 mg total) by mouth 3 (three) times daily as needed. 90 tablet 2  . montelukast (SINGULAIR) 10 MG tablet TAKE 1 TABLET(10 MG) BY MOUTH AT BEDTIME 30 tablet 11  . Multiple Vitamin (MULTI VITAMIN PO) Take 1 tablet by mouth daily.    . OXYGEN Inhale 2 L/min into the lungs as needed. For sats less than 90%    . predniSONE (DELTASONE) 10 MG tablet Take 1 tablet (10 mg total) by mouth daily. 30 tablet 2  . rivaroxaban (XARELTO) 20 MG TABS tablet TAKE 1 TABLET(20 MG) BY MOUTH EVERY EVENING 30 tablet 3  . sodium chloride (OCEAN) 0.65 % SOLN nasal spray Place 1 spray into both nostrils 4 (four) times daily as needed for congestion. 15 mL 0  . tiotropium (SPIRIVA HANDIHALER) 18 MCG inhalation capsule INHALE THE CONTENTS OF 1 CAPSULE VIA INHALATION DEVICE EVERY DAY Appt is need for future refills 30 capsule 0  . triamterene-hydrochlorothiazide (MAXZIDE-25) 37.5-25 MG tablet TAKE 1 TABLET BY MOUTH DAILY 90 tablet 2   No current facility-administered medications on file prior to visit.   Review of Systems All otherwise neg per pt     Objective:   Physical Exam BP 100/68 (BP Location: Left Arm, Patient Position: Sitting, Cuff Size: Large)   Pulse 102   Temp 98.9 F (37.2 C) (Oral)   Ht 6\' 4"  (1.93 m)   SpO2 98%   BMI 25.78 kg/m  VS noted,  Constitutional: Pt appears in NAD HENT: Head: NCAT.  Right Ear: External ear normal.  Left Ear: External ear normal.  Eyes: . Pupils are equal, round, and reactive to light. Conjunctivae and EOM are normal Nose: without d/c or deformity Neck: Neck supple. Gross normal ROM Cardiovascular: Normal rate and regular rhythm.   Pulmonary/Chest: Effort normal to slight increased, and breath sounds without rales but with few bilat wheezing.  Abd:  Soft, NT, ND, + BS, no organomegaly Neurological: Pt is alert. At baseline orientation, motor grossly intact Skin: Skin is  warm. No rashes, other new lesions, no LE edema Psychiatric: Pt behavior is normal without agitation  All otherwise neg per pt Lab Results  Component Value Date   WBC 12.1 (H) 06/03/2020   HGB 13.6 06/03/2020   HCT 39.9 06/03/2020   PLT 398.0 06/03/2020   GLUCOSE 114 (H) 06/03/2020   CHOL 206 (H) 06/03/2020   TRIG 101.0 06/03/2020   HDL 68.40 06/03/2020   LDLCALC 117 (H) 06/03/2020   ALT 14 06/03/2020   AST 16 06/03/2020   NA 130 (L) 06/03/2020   K 4.2 06/03/2020   CL 93 (L) 06/03/2020   CREATININE 0.85 06/03/2020   BUN 11 06/03/2020   CO2 26 06/03/2020   TSH 0.73 06/03/2020   PSA 0.87 06/03/2020   INR 1.15 07/16/2017   HGBA1C 5.6 06/03/2020   MICROALBUR <0.7 06/03/2020      Assessment & Plan:

## 2020-06-03 NOTE — Patient Instructions (Addendum)
Please take all new medication as prescribed - the celexa for anxiety  You had the steroid shot today  Please continue all other medications as before, and refills have been done if requested.  Please have the pharmacy call with any other refills you may need.  Please continue your efforts at being more active, low cholesterol diet, and weight control.  You are otherwise up to date with prevention measures today.  Please keep your appointments with your specialists as you may have planned  Please go to the LAB at the blood drawing area for the tests to be done - at the [facility] site  You will be contacted by phone if any changes need to be made immediately.  Otherwise, you will receive a letter about your results with an explanation, but please check with MyChart first.  Please remember to sign up for MyChart if you have not done so, as this will be important to you in the future with finding out test results, communicating by private email, and scheduling acute appointments online when needed.  Please make an Appointment to return in 6 months, or sooner if needed

## 2020-06-04 DIAGNOSIS — J9611 Chronic respiratory failure with hypoxia: Secondary | ICD-10-CM | POA: Diagnosis not present

## 2020-06-04 DIAGNOSIS — J441 Chronic obstructive pulmonary disease with (acute) exacerbation: Secondary | ICD-10-CM | POA: Diagnosis not present

## 2020-06-04 DIAGNOSIS — R7303 Prediabetes: Secondary | ICD-10-CM | POA: Diagnosis not present

## 2020-06-04 DIAGNOSIS — E538 Deficiency of other specified B group vitamins: Secondary | ICD-10-CM | POA: Diagnosis not present

## 2020-06-04 DIAGNOSIS — F411 Generalized anxiety disorder: Secondary | ICD-10-CM | POA: Diagnosis not present

## 2020-06-04 DIAGNOSIS — E559 Vitamin D deficiency, unspecified: Secondary | ICD-10-CM | POA: Diagnosis not present

## 2020-06-04 DIAGNOSIS — Z0000 Encounter for general adult medical examination without abnormal findings: Secondary | ICD-10-CM | POA: Diagnosis not present

## 2020-06-04 LAB — URINALYSIS, ROUTINE W REFLEX MICROSCOPIC
Bilirubin Urine: NEGATIVE
Ketones, ur: NEGATIVE
Leukocytes,Ua: NEGATIVE
Nitrite: NEGATIVE
RBC / HPF: NONE SEEN (ref 0–?)
Specific Gravity, Urine: 1.02 (ref 1.000–1.030)
Total Protein, Urine: NEGATIVE
Urine Glucose: NEGATIVE
Urobilinogen, UA: 0.2 (ref 0.0–1.0)
pH: 6.5 (ref 5.0–8.0)

## 2020-06-04 LAB — LIPID PANEL
Cholesterol: 206 mg/dL — ABNORMAL HIGH (ref 0–200)
HDL: 68.4 mg/dL (ref >39.00)
LDL Cholesterol: 117 mg/dL — ABNORMAL HIGH (ref 0–99)
NonHDL: 137.21
Total CHOL/HDL Ratio: 3
Triglycerides: 101 mg/dL (ref 0.0–149.0)
VLDL: 20.2 mg/dL (ref 0.0–40.0)

## 2020-06-04 LAB — PSA: PSA: 0.87 ng/mL (ref 0.10–4.00)

## 2020-06-04 LAB — HEMOGLOBIN A1C: Hgb A1c MFr Bld: 5.6 % (ref 4.6–6.5)

## 2020-06-04 LAB — VITAMIN D 25 HYDROXY (VIT D DEFICIENCY, FRACTURES): VITD: 27.8 ng/mL — ABNORMAL LOW (ref 30.00–100.00)

## 2020-06-04 LAB — TSH: TSH: 0.73 u[IU]/mL (ref 0.35–4.50)

## 2020-06-04 LAB — MICROALBUMIN / CREATININE URINE RATIO
Creatinine,U: 96.8 mg/dL
Microalb Creat Ratio: 0.7 mg/g (ref 0.0–30.0)
Microalb, Ur: <0.7 mg/dL (ref 0.0–1.9)

## 2020-06-04 LAB — VITAMIN B12: Vitamin B-12: 320 pg/mL (ref 211–911)

## 2020-06-04 MED ORDER — METHYLPREDNISOLONE ACETATE 80 MG/ML IJ SUSP
80.0000 mg | Freq: Once | INTRAMUSCULAR | Status: AC
  Administered 2020-06-04: 80 mg via INTRAMUSCULAR

## 2020-06-05 ENCOUNTER — Other Ambulatory Visit: Payer: Self-pay | Admitting: *Deleted

## 2020-06-05 ENCOUNTER — Other Ambulatory Visit: Payer: Self-pay | Admitting: Internal Medicine

## 2020-06-05 ENCOUNTER — Encounter: Payer: Self-pay | Admitting: Internal Medicine

## 2020-06-05 DIAGNOSIS — Z87891 Personal history of nicotine dependence: Secondary | ICD-10-CM

## 2020-06-05 MED ORDER — VITAMIN D (ERGOCALCIFEROL) 1.25 MG (50000 UNIT) PO CAPS: 50000.0000 [IU] | ORAL_CAPSULE | ORAL | 0 refills | Status: AC

## 2020-06-05 MED ORDER — ATORVASTATIN CALCIUM 10 MG PO TABS
10.0000 mg | ORAL_TABLET | Freq: Every day | ORAL | 3 refills | Status: AC
Start: 2020-06-05 — End: ?

## 2020-06-10 ENCOUNTER — Encounter: Payer: Self-pay | Admitting: Internal Medicine

## 2020-06-10 NOTE — Assessment & Plan Note (Signed)
stable overall by history and exam, recent data reviewed with pt, and pt to continue medical treatment as before,  to f/u any worsening symptoms or concerns, for a1c 

## 2020-06-10 NOTE — Assessment & Plan Note (Signed)

## 2020-06-10 NOTE — Assessment & Plan Note (Addendum)
Mild to mod, for depomedrol 80 IM,  to f/u any worsening symptoms or concerns  I spent 31 minutes in addition to time for CPX wellness examination in preparing to see the patient by review of recent labs, imaging and procedures, obtaining and reviewing separately obtained history, communicating with the patient and family or caregiver, ordering medications, tests or procedures, and documenting clinical information in the EHR including the differential Dx, treatment, and any further evaluation and other management of copd exacerbation, anxiety, chronic resp failure with hypoxia, and preDM

## 2020-06-10 NOTE — Assessment & Plan Note (Signed)
stable overall by history and exam, recent data reviewed with pt, and pt to continue medical treatment as before,  to f/u any worsening symptoms or concerns  

## 2020-06-10 NOTE — Assessment & Plan Note (Signed)
Mild to mod, for celexa 10 qd, declines counseling,  to f/u any worsening symptoms or concerns

## 2020-06-19 ENCOUNTER — Other Ambulatory Visit: Payer: Self-pay | Admitting: Acute Care

## 2020-06-19 ENCOUNTER — Other Ambulatory Visit: Payer: Self-pay | Admitting: Pulmonary Disease

## 2020-06-24 ENCOUNTER — Telehealth: Payer: Self-pay | Admitting: Acute Care

## 2020-06-24 NOTE — Telephone Encounter (Signed)
[facility] ct calling for pt who needs to resched share decision appt. Advised that we will call pt back to get appt reschedule

## 2020-06-24 NOTE — Telephone Encounter (Signed)
Spoke to pt and r/s [facility] 08/05/20 3:30 CT will be rescheduled  Nothing further needed

## 2020-06-26 ENCOUNTER — Inpatient Hospital Stay: Admission: RE | Admit: 2020-06-26 | Payer: PPO | Source: Ambulatory Visit

## 2020-06-26 ENCOUNTER — Encounter: Payer: PPO | Admitting: Acute Care

## 2020-06-30 DIAGNOSIS — I2609 Other pulmonary embolism with acute cor pulmonale: Secondary | ICD-10-CM | POA: Diagnosis not present

## 2020-06-30 DIAGNOSIS — R0609 Other forms of dyspnea: Secondary | ICD-10-CM | POA: Diagnosis not present

## 2020-06-30 DIAGNOSIS — J9611 Chronic respiratory failure with hypoxia: Secondary | ICD-10-CM | POA: Diagnosis not present

## 2020-06-30 DIAGNOSIS — J9610 Chronic respiratory failure, unspecified whether with hypoxia or hypercapnia: Secondary | ICD-10-CM | POA: Diagnosis not present

## 2020-06-30 DIAGNOSIS — J449 Chronic obstructive pulmonary disease, unspecified: Secondary | ICD-10-CM | POA: Diagnosis not present

## 2020-07-01 ENCOUNTER — Encounter: Payer: PPO | Admitting: Acute Care

## 2020-07-01 ENCOUNTER — Ambulatory Visit: Payer: PPO

## 2020-07-10 ENCOUNTER — Encounter: Payer: Self-pay | Admitting: Primary Care

## 2020-07-10 ENCOUNTER — Ambulatory Visit: Payer: PPO | Admitting: Pulmonary Disease

## 2020-07-10 ENCOUNTER — Ambulatory Visit (INDEPENDENT_AMBULATORY_CARE_PROVIDER_SITE_OTHER): Payer: PPO | Admitting: Primary Care

## 2020-07-10 ENCOUNTER — Other Ambulatory Visit: Payer: Self-pay

## 2020-07-10 VITALS — BP 146/76 | HR 100 | Temp 98.4°F | Ht 76.0 in | Wt 206.2 lb

## 2020-07-10 DIAGNOSIS — J449 Chronic obstructive pulmonary disease, unspecified: Secondary | ICD-10-CM

## 2020-07-10 DIAGNOSIS — Z87891 Personal history of nicotine dependence: Secondary | ICD-10-CM | POA: Diagnosis not present

## 2020-07-10 DIAGNOSIS — J9611 Chronic respiratory failure with hypoxia: Secondary | ICD-10-CM

## 2020-07-10 MED ORDER — BREZTRI AEROSPHERE 160-9-4.8 MCG/ACT IN AERO
2.0000 | INHALATION_SPRAY | Freq: Two times a day (BID) | RESPIRATORY_TRACT | 0 refills | Status: DC
Start: 2020-07-10 — End: 2020-08-08

## 2020-07-10 MED ORDER — DOXYCYCLINE HYCLATE 100 MG PO TABS: 100.0000 mg | ORAL_TABLET | Freq: Two times a day (BID) | ORAL | 0 refills | Status: DC

## 2020-07-10 MED ORDER — PREDNISONE 10 MG PO TABS: ORAL_TABLET | ORAL | 0 refills | Status: DC

## 2020-07-10 MED ORDER — PREDNISONE 10 MG PO TABS: 10.0000 mg | ORAL_TABLET | Freq: Every day | ORAL | 2 refills | Status: DC

## 2020-07-10 NOTE — Progress Notes (Signed)
@Patient  ID: , male    DOB: [birth year], 64 y.o.   MRN: [id number]  Chief Complaint  Patient presents with  . Follow-up    Referring provider: [id number], MD   Synopsis: Has stage IV COPD (steroid dependent) and history of a pulmonary embolism after a hospitalization in 2015 formerly followed by Dr. 2016.  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 PMH significant for COPD GOLD IV, chronic respiratory failure, allergic rhinitis, CAD, PE, cardiac arrhythmia, MI, multiple pulmonary nodules, pre-diabetes, left total hip replacement. Patient of Dr. 2014. Symb/Spiriva changed to [facility] September 2021. Maintained on Albuterol hfa/nebulizer q6 hours, Singulair and prednisone 10mg  daily. Oxygen dependent 2-3L on exertion. Completed physical therapy some years ago.   Previous [facility] pulmonary encounter: 07/05/2019 Patient presents today 3-5 month follow-up visit. Breathing is no worse but feels it is progressing. Chronic cough with mucus production. Not much color. Still taking azithromycin but not the last couple of days because he ran out. Compliant with Symbicort and Spiriva. Using nebulizer every 6 hours. Prednisone is at 10mg  daily. Not currently using oxygen. Has a concentrator at home but needs service. Occasionally misses mucinex dosing.   09/28/2019 Admitted to the hospital from 10/21-11/9 for COPD exacerbation, acute respiratory failure and pneumoperitoneum postoperative ileus s/p exploratory laparotomy by Dr. without any findings. Difficult to extubate postoperatively and remained on ventilatory support for 48 hours before  being extubated on 10/28. CXR showed hyperinflation and emphysema without acute findings. Covid negative. He was discharged to SNF. Patient is still at facility. Reports that he is doing better than he was. Receiving PT/OT and feels he is making improvements. Still feels tired. Using 3L oxygen. Has productive cough, getting up mucus is the morning. Taking 1,200mg  mucinex twice daily. Continues taking Symbicort and Spiriva as directed.      11/09/19- Dr. Ezzard Standing Stage 4 severe COPD/chronic respiratory failure/PEx2 Medications refilled; continue Symbicort, Spiriva, Xarelto, singulair, prn albuterol and prednisone 10mg  daily. Follow-up 6 months.   01/24/2020 Patient contacted today for acute televisit. Reports home health nurse heard wheezing in lung fields. States that he has not noticed any increased wheezing himself. He has a chronic productive cough, no significant color. He has been slightly more short of breath over the last several days. He is compliant with Symb +Spiriva. Takes 1,200mg  mucinex twice daily.  Continues 10mg  chronic daily prednisone. Received second pfizer covid vaccine on march 8th.   05/30/2020 Patient contacted virtually today for regular follow-up. His breathing is baseline. Reports dyspnea with mild exertion. States that he can not walk more than a few feet without getting short of breath. He is compliant with Symbicort and Spiriva. On average he uses Albuterol nebulizer every 6 hours, states that using his nebulizer prior to bedtime helps him sleep better. He has usual cough which is productive. Mucus is clear. He has not had recent exacerbations requiring antibiotics. He is on prednisone 10mg  daily. He remains on 2L oxygen. He was on daily azithromycin for 6 months back in 2020 predscribed by Dr. . He is not taking azithromycin daily anymore. He is open to restarting this as  he feels it did help reduce exacerbations.   07/10/2020- Interim hx Patient presents today for regular  follow-up. Accompanied by his sister, patient lives alone. Needs to requlify for oxygen today. DME company is [employer]. He uses 3L oxygen continuously. He reports that his breathing is baseline but he becomes very out of breath with the slightest amount of exertion. He continues to have congested cough with thick mucus. He is compliant with Symbicort 160 + Spiriva Handihaler. He has never been tried on [employer]. He uses Albuterol nebulizer 3-4 times a day which does help. He takes mucinex daily. He is not currently using flutter valve as this has causes him to pass out in the past.    Imaging:  09/04/19 CT abdomen - emphysema changes with hyperinflation   Allergies  Allergen Reactions  . Naproxen Sodium Itching    Immunization History  Administered Date(s) Administered  . Fluad Quad(high Dose 65+) 07/05/2019  . Influenza Split 08/10/2011, 07/27/2012  . Influenza,inj,Quad PF,6+ Mos 07/14/2013, 08/06/2014, 10/17/2015, 07/21/2016, 08/26/2017, 07/28/2018  . PFIZER SARS-COV-2 Vaccination 12/21/2019, 01/15/2020  . PPD Test 07/22/2017, 07/29/2017  . Pneumococcal Conjugate-13 08/06/2014  . Pneumococcal Polysaccharide-23 02/05/2012  . Tdap 09/03/2014, 07/14/2017    Past Medical History:  Diagnosis Date  . Anxiety   . Arthritis    "back" (01/24/2014)  . Asthma   . Chronic bronchitis (HCC)   . Cluster headache    "last bout was summer 2014; had them q spring 1991-2001" (01/24/2014)  . COPD (chronic obstructive pulmonary disease) (HCC)   . GERD (gastroesophageal reflux disease)   . NSTEMI (non-ST elevated myocardial infarction) (HCC) 01/2014  . On home oxygen therapy    "2L; 24/7" (01/24/2014)  . Pneumonia 06/2013; 01/2014  . Pulmonary embolism (HCC) 01/23/2014  . Seasonal allergies     Tobacco History: Social History   Tobacco Use  Smoking Status Former Smoker  . Packs/day: 2.00  . Years: 35.00  . Pack years: 70.00  . Types: Cigarettes  . Quit date: 01/24/2012  . Years since  quitting: 8.4  Smokeless Tobacco Never Used  Tobacco Comment   Remain smoke free   Counseling given: Not Answered Comment: Remain smoke free   Outpatient Medications Prior to Visit  Medication Sig Dispense Refill  . albuterol (PROVENTIL) (2.5 MG/3ML) 0.083% nebulizer solution Take 3 mLs (2.5 mg total) by nebulization every 6 (six) hours as needed for wheezing or shortness of breath. 360 mL 5  . albuterol (VENTOLIN HFA) 108 (90 Base) MCG/ACT inhaler Inhale 2 puffs into the lungs every 6 (six) hours as needed for wheezing or shortness of breath. 8 g 11  . atorvastatin (LIPITOR) 10 MG tablet Take 1 tablet (10 mg total) by mouth daily. 90 tablet 3  . budesonide-formoterol (SYMBICORT) 160-4.5 MCG/ACT inhaler INHALE 2 PUFFS FIRST THING IN THE MORNING AND ANOTHER 2 PUFFS ABOUT 12 HOURS LATER 10.2 g 5  . citalopram (CELEXA) 20 MG tablet Take 1 tablet (20 mg total) by mouth daily. 90 tablet 3  . fluticasone (FLONASE) 50 MCG/ACT nasal spray Place 1 spray into both nostrils daily. 9.9 mL 0  . guaiFENesin (MUCINEX) 600 MG 12 hr tablet Take 2 tablets (1,200 mg total) by mouth 2 (two) times daily. 120 tablet 0  . hydrOXYzine (ATARAX/VISTARIL) 10 MG tablet Take 1 tablet (10 mg total) by mouth 3 (three) times daily as needed. 90 tablet 2  . montelukast (SINGULAIR) 10 MG tablet TAKE 1 TABLET(10 MG) BY MOUTH AT BEDTIME 30 tablet 11  .  Multiple Vitamin (MULTI VITAMIN PO) Take 1 tablet by mouth daily.    . OXYGEN Inhale 2 L/min into the lungs as needed. For sats less than 90%    . rivaroxaban (XARELTO) 20 MG TABS tablet TAKE 1 TABLET(20 MG) BY MOUTH EVERY EVENING 30 tablet 3  . sodium chloride (OCEAN) 0.65 % SOLN nasal spray Place 1 spray into both nostrils 4 (four) times daily as needed for congestion. 15 mL 0  . tiotropium (SPIRIVA HANDIHALER) 18 MCG inhalation capsule INHALE THE CONTENTS OF 1 CAPSULE VIA INHALATION DEVICE EVERY DAY Appt is need for future refills 30 capsule 0  .  triamterene-hydrochlorothiazide (MAXZIDE-25) 37.5-25 MG tablet TAKE 1 TABLET BY MOUTH DAILY 90 tablet 2  . Vitamin D, Ergocalciferol, (DRISDOL) 1.25 MG (50000 UNIT) CAPS capsule Take 1 capsule (50,000 Units total) by mouth every 7 (seven) days. 12 capsule 0  . predniSONE (DELTASONE) 10 MG tablet Take 1 tablet (10 mg total) by mouth daily. 30 tablet 2   No facility-administered medications prior to visit.   Review of Systems  Review of Systems  Constitutional: Negative.   Respiratory: Positive for cough, shortness of breath and wheezing.    Physical Exam  BP (!) 146/76 (BP Location: Left Arm, Cuff Size: Normal)   Pulse 100   Temp 98.4 F (36.9 C) (Oral)   Ht 6\' 4"  (1.93 m)   Wt 206 lb 3.2 oz (93.5 kg)   SpO2 95%   BMI 25.10 kg/m  Physical Exam Constitutional:      Appearance: Normal appearance.  HENT:     Head: Normocephalic and atraumatic.     Mouth/Throat:     Mouth: Mucous membranes are moist.     Pharynx: Oropharynx is clear.  Cardiovascular:     Rate and Rhythm: Normal rate and regular rhythm.     Comments: Tachycardia on exertion  Pulmonary:     Effort: Pulmonary effort is normal. No respiratory distress.     Breath sounds: Wheezing present. No rhonchi.     Comments: Upper airway congestion  Neurological:     General: No focal deficit present.     Mental Status: He is alert and oriented to person, place, and time. Mental status is at baseline.  Psychiatric:        Mood and Affect: Mood normal.        Behavior: Behavior normal.        Thought Content: Thought content normal.        Judgment: Judgment normal.      Lab Results:  CBC    Component Value Date/Time   WBC 12.1 (H) 06/03/2020 1713   RBC 4.39 06/03/2020 1713   HGB 13.6 06/03/2020 1713   HCT 39.9 06/03/2020 1713   PLT 398.0 06/03/2020 1713   MCV 90.9 06/03/2020 1713   MCH 31.2 09/14/2019 0513   MCHC 34.0 06/03/2020 1713   RDW 14.4 06/03/2020 1713   LYMPHSABS 1.0 06/03/2020 1713   MONOABS 0.8  06/03/2020 1713   EOSABS 0.1 06/03/2020 1713   BASOSABS 0.1 06/03/2020 1713    BMET    Component Value Date/Time   NA 130 (L) 06/03/2020 1713   NA 137 09/20/2019 0000   K 4.2 06/03/2020 1713   CL 93 (L) 06/03/2020 1713   CO2 26 06/03/2020 1713   GLUCOSE 114 (H) 06/03/2020 1713   BUN 11 06/03/2020 1713   BUN 20 09/20/2019 0000   CREATININE 0.85 06/03/2020 1713   CALCIUM 8.9 06/03/2020 1713   GFRNONAA  91 06/03/2020 1713   GFRAA 105 06/03/2020 1713    BNP    Component Value Date/Time   BNP 44.0 08/30/2019 2339    ProBNP    Component Value Date/Time   PROBNP 38.0 08/18/2018 1144    Imaging: No results found.   Assessment & Plan:   COPD GOLD IV  - Patient is extremely limited by his COPD symptoms. He has severe dyspnea with minimal exertion and congested cough. Diffuse wheezing in lung fields. Plan change patient inhaler regimen to Breztri two puffs twice daily. Continue Prednisone 10mg  daily and albuterol hfa/nebulizer q 6 hours. Patient needs new nebulizer machine. Recommend follow-up in 2 weeks and at that visit would recommend resuming Azithromycin MWF.   Chronic respiratory failure with hypoxia (HCC) - Patient re-qualified for oxygen today with [employer] - O2 88% RA aftr 1/2 lap, requiring 3-4L oxygen on exertion to maintain O2 >90%  Former smoker - Referred to lung cancer screening program, scheduled for LDCT this month    Glenford BayleyElizabeth W Roselee Tayloe, NP 07/10/2020

## 2020-07-10 NOTE — Assessment & Plan Note (Signed)
-   Referred to lung cancer screening program, scheduled for LDCT this month

## 2020-07-10 NOTE — Assessment & Plan Note (Signed)
-   Patient re-qualified for oxygen today with [employer] - O2 88% RA aftr 1/2 lap, requiring 3-4L oxygen on exertion to maintain O2 >90%

## 2020-07-10 NOTE — Assessment & Plan Note (Addendum)
-   Patient is extremely limited by his COPD symptoms. He has severe dyspnea with minimal exertion and congested cough. Diffuse wheezing in lung fields. Plan change patient inhaler regimen to Breztri two puffs twice daily. Continue Prednisone 10mg  daily and albuterol hfa/nebulizer q 6 hours. Patient needs new nebulizer machine. Recommend follow-up in 2 weeks and at that visit would recommend resuming Azithromycin MWF.

## 2020-07-10 NOTE — Addendum Note (Signed)
Addended by: Legrand Pitts on: 07/10/2020 02:44 PM   Modules accepted: Orders

## 2020-07-10 NOTE — Patient Instructions (Addendum)
Recommendations: - Stop Symbicort / Stop Spiriva - Start Breztri two puffs morning and evening (rinse mouth after use) - Use Albuterol (proventil) nebulizer every 6 hours - Continue Flonase and ocean nasal spray  - Continue to use 3-4L oxygen continuously to keep O2 >90%  Rx: - Take 40mg  prednisone x 3 days; 30mg  x 3 days; 20mg  x 3 days; 10mg  x 3 days - Doxycycline 1 tab twice daily x 7 days   Orders: - Please send in order renew oxygen at 3-4L on exertion with [employer]  - Needs new nebulizer machine  Follow-up: - 2 weeks with Beth Np   - Due for lung cancer screening in September

## 2020-07-10 NOTE — Addendum Note (Signed)
Addended by: Legrand Pitts on: 07/10/2020 04:22 PM   Modules accepted: Orders

## 2020-07-11 DIAGNOSIS — J441 Chronic obstructive pulmonary disease with (acute) exacerbation: Secondary | ICD-10-CM | POA: Diagnosis not present

## 2020-07-11 DIAGNOSIS — J9611 Chronic respiratory failure with hypoxia: Secondary | ICD-10-CM | POA: Diagnosis not present

## 2020-07-11 DIAGNOSIS — R2681 Unsteadiness on feet: Secondary | ICD-10-CM | POA: Diagnosis not present

## 2020-07-24 ENCOUNTER — Ambulatory Visit: Payer: PPO | Admitting: Primary Care

## 2020-07-24 NOTE — Progress Notes (Deleted)
@Patient  ID: , male    DOB: [birth year], 64 y.o.   MRN: [id number]  No chief complaint on file.   Referring provider: [id number], MD  Synopsis: Has stage IV COPD (steroid dependent) and history of a pulmonary embolism after a hospitalization in 2015 formerly followed by Dr. 2016.  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 PMH significant for COPD GOLD IV, chronic respiratory failure, allergic rhinitis, CAD, PE, cardiac arrhythmia, MI, multiple pulmonary nodules, pre-diabetes, left total hip replacement. Patient of Dr. 2014. Symb/Spiriva changed to [facility] September 2021. Maintained on Albuterol hfa/nebulizer q6 hours, Singulair and prednisone 10mg  daily. Oxygen dependent 2-3L on exertion. Completed physical therapy some years ago.   Previous [facility] pulmonary encounter: 07/05/2019 Patient presents today 3-5 month follow-up visit. Breathing is no worse but feels it is progressing. Chronic cough with mucus production. Not much color. Still taking azithromycin but not the last couple of days because he ran out. Compliant with Symbicort and Spiriva. Using nebulizer every 6 hours. Prednisone is at 10mg  daily. Not currently using oxygen. Has a concentrator at home but needs service. Occasionally misses mucinex dosing.   09/28/2019 Admitted to the hospital from 10/21-11/9 for COPD exacerbation, acute respiratory failure and pneumoperitoneum postoperative ileus s/p exploratory laparotomy by Dr. without any findings. Difficult to extubate postoperatively and remained on ventilatory support for 48 hours before being extubated on 10/28. CXR  showed hyperinflation and emphysema without acute findings. Covid negative. He was discharged to SNF. Patient is still at facility. Reports that he is doing better than he was. Receiving PT/OT and feels he is making improvements. Still feels tired. Using 3L oxygen. Has productive cough, getting up mucus is the morning. Taking 1,200mg  mucinex twice daily. Continues taking Symbicort and Spiriva as directed.      11/09/19- Dr. Ezzard Standing Stage 4 severe COPD/chronic respiratory failure/PEx2 Medications refilled; continue Symbicort, Spiriva, Xarelto, singulair, prn albuterol and prednisone 10mg  daily. Follow-up 6 months.   01/24/2020 Patient contacted today for acute televisit. Reports home health nurse heard wheezing in lung fields. States that he has not noticed any increased wheezing himself. He has a chronic productive cough, no significant color. He has been slightly more short of breath over the last several days. He is compliant with Symb +Spiriva. Takes 1,200mg  mucinex twice daily.  Continues 10mg  chronic daily prednisone. Received second pfizer covid vaccine on march 8th.   05/30/2020 Patient contacted virtually today for regular follow-up. His breathing is baseline. Reports dyspnea with mild exertion. States that he can not walk more than a few feet without getting short of breath. He is compliant with Symbicort and Spiriva. On average he uses Albuterol nebulizer every 6 hours, states that using his nebulizer prior to bedtime helps him sleep better. He has usual cough which is productive. Mucus is clear. He has not had recent exacerbations requiring antibiotics. He is on prednisone 10mg  daily. He remains on 2L oxygen. He was on daily azithromycin for 6 months back in 2020 predscribed by Dr. . He is not taking azithromycin daily anymore. He is open to restarting this as he feels it did help reduce  exacerbations.   07/10/2020 Patient presents today for regular follow-up. Accompanied by his sister,  patient lives alone. Needs to requlify for oxygen today. DME company is [employer]. He uses 3L oxygen continuously. He reports that his breathing is baseline but he becomes very out of breath with the slightest amount of exertion. He continues to have congested cough with thick mucus. He is compliant with Symbicort 160 + Spiriva Handihaler. He has never been tried on [employer]. He uses Albuterol nebulizer 3-4 times a day which does help. He takes mucinex daily. He is not currently using flutter valve as this has causes him to pass out in the past.   07/24/2020 Patient presents today for 2 week follow-up. Patient is extremely limited by his COPD symptoms. He has severe dyspnea with minimal exertion and congested cough. During last visit we started patient on Breztri. Continue 10mg  prednisone and albuterol hfa/nebulizer q 6 hours. Sent in new prescription for nebulizer machine. Consider to resume Azithromycin MWF. He has been referred to lung cancer screening program and has an apt on 08/05/20.     Imaging:  09/04/19 CT abdomen - emphysema changes with hyperinflation   Allergies  Allergen Reactions   Naproxen Sodium Itching    Immunization History  Administered Date(s) Administered   Fluad Quad(high Dose 65+) 07/05/2019   Influenza Split 08/10/2011, 07/27/2012   Influenza,inj,Quad PF,6+ Mos 07/14/2013, 08/06/2014, 10/17/2015, 07/21/2016, 08/26/2017, 07/28/2018   PFIZER SARS-COV-2 Vaccination 12/21/2019, 01/15/2020   PPD Test 07/22/2017, 07/29/2017   Pneumococcal Conjugate-13 08/06/2014   Pneumococcal Polysaccharide-23 02/05/2012   Tdap 09/03/2014, 07/14/2017    Past Medical History:  Diagnosis Date   Anxiety    Arthritis    "back" (01/24/2014)   Asthma    Chronic bronchitis (HCC)    Cluster headache    "last bout was summer 2014; had them q spring 1991-2001" (01/24/2014)   COPD (chronic obstructive pulmonary disease) (HCC)    GERD (gastroesophageal reflux disease)      NSTEMI (non-ST elevated myocardial infarction) (HCC) 01/2014   On home oxygen therapy    "2L; 24/7" (01/24/2014)   Pneumonia 06/2013; 01/2014   Pulmonary embolism (HCC) 01/23/2014   Seasonal allergies     Tobacco History: Social History   Tobacco Use  Smoking Status Former Smoker   Packs/day: 2.00   Years: 35.00   Pack years: 70.00   Types: Cigarettes   Quit date: 01/24/2012   Years since quitting: 8.5  Smokeless Tobacco Never Used  Tobacco Comment   Remain smoke free   Counseling given: Not Answered Comment: Remain smoke free   Outpatient Medications Prior to Visit  Medication Sig Dispense Refill   albuterol (PROVENTIL) (2.5 MG/3ML) 0.083% nebulizer solution Take 3 mLs (2.5 mg total) by nebulization every 6 (six) hours as needed for wheezing or shortness of breath. 360 mL 5   albuterol (VENTOLIN HFA) 108 (90 Base) MCG/ACT inhaler Inhale 2 puffs into the lungs every 6 (six) hours as needed for wheezing or shortness of breath. 8 g 11   atorvastatin (LIPITOR) 10 MG tablet Take 1 tablet (10 mg total) by mouth daily. 90 tablet 3   Budeson-Glycopyrrol-Formoterol (BREZTRI AEROSPHERE) 160-9-4.8 MCG/ACT AERO Inhale 2 puffs into the lungs 2 (two) times daily. 5.9 g 0   budesonide-formoterol (SYMBICORT) 160-4.5 MCG/ACT inhaler INHALE 2 PUFFS FIRST THING IN THE MORNING AND ANOTHER 2 PUFFS ABOUT 12 HOURS LATER 10.2 g 5   citalopram (CELEXA) 20 MG tablet Take 1 tablet (20 mg total) by mouth daily. 90  tablet 3   doxycycline (VIBRA-TABS) 100 MG tablet Take 1 tablet (100 mg total) by mouth 2 (two) times daily. 14 tablet 0   fluticasone (FLONASE) 50 MCG/ACT nasal spray Place 1 spray into both nostrils daily. 9.9 mL 0   guaiFENesin (MUCINEX) 600 MG 12 hr tablet Take 2 tablets (1,200 mg total) by mouth 2 (two) times daily. 120 tablet 0   hydrOXYzine (ATARAX/VISTARIL) 10 MG tablet Take 1 tablet (10 mg total) by mouth 3 (three) times daily as needed. 90 tablet 2   montelukast  (SINGULAIR) 10 MG tablet TAKE 1 TABLET(10 MG) BY MOUTH AT BEDTIME 30 tablet 11   Multiple Vitamin (MULTI VITAMIN PO) Take 1 tablet by mouth daily.     OXYGEN Inhale 2 L/min into the lungs as needed. For sats less than 90%     predniSONE (DELTASONE) 10 MG tablet Take 40mg  daily x 3 days; then 30mg  daily x3 days; 20mg  daily x3 days; then stay on 10mg  daily 30 tablet 0   predniSONE (DELTASONE) 10 MG tablet Take 1 tablet (10 mg total) by mouth daily. 30 tablet 2   rivaroxaban (XARELTO) 20 MG TABS tablet TAKE 1 TABLET(20 MG) BY MOUTH EVERY EVENING 30 tablet 3   sodium chloride (OCEAN) 0.65 % SOLN nasal spray Place 1 spray into both nostrils 4 (four) times daily as needed for congestion. 15 mL 0   tiotropium (SPIRIVA HANDIHALER) 18 MCG inhalation capsule INHALE THE CONTENTS OF 1 CAPSULE VIA INHALATION DEVICE EVERY DAY Appt is need for future refills 30 capsule 0   triamterene-hydrochlorothiazide (MAXZIDE-25) 37.5-25 MG tablet TAKE 1 TABLET BY MOUTH DAILY 90 tablet 2   Vitamin D, Ergocalciferol, (DRISDOL) 1.25 MG (50000 UNIT) CAPS capsule Take 1 capsule (50,000 Units total) by mouth every 7 (seven) days. 12 capsule 0   No facility-administered medications prior to visit.      Review of Systems  Review of Systems   Physical Exam  There were no vitals taken for this visit. Physical Exam   Lab Results:  CBC    Component Value Date/Time   WBC 12.1 (H) 06/03/2020 1713   RBC 4.39 06/03/2020 1713   HGB 13.6 06/03/2020 1713   HCT 39.9 06/03/2020 1713   PLT 398.0 06/03/2020 1713   MCV 90.9 06/03/2020 1713   MCH 31.2 09/14/2019 0513   MCHC 34.0 06/03/2020 1713   RDW 14.4 06/03/2020 1713   LYMPHSABS 1.0 06/03/2020 1713   MONOABS 0.8 06/03/2020 1713   EOSABS 0.1 06/03/2020 1713   BASOSABS 0.1 06/03/2020 1713    BMET    Component Value Date/Time   NA 130 (L) 06/03/2020 1713   NA 137 09/20/2019 0000   K 4.2 06/03/2020 1713   CL 93 (L) 06/03/2020 1713   CO2 26 06/03/2020 1713     GLUCOSE 114 (H) 06/03/2020 1713   BUN 11 06/03/2020 1713   BUN 20 09/20/2019 0000   CREATININE 0.85 06/03/2020 1713   CALCIUM 8.9 06/03/2020 1713   GFRNONAA 91 06/03/2020 1713   GFRAA 105 06/03/2020 1713    BNP    Component Value Date/Time   BNP 44.0 08/30/2019 2339    ProBNP    Component Value Date/Time   PROBNP 38.0 08/18/2018 1144    Imaging: No results found.   Assessment & Plan:   No problem-specific Assessment & Plan notes found for this encounter.     06/05/2020, NP 07/24/2020

## 2020-07-31 DIAGNOSIS — J9610 Chronic respiratory failure, unspecified whether with hypoxia or hypercapnia: Secondary | ICD-10-CM | POA: Diagnosis not present

## 2020-07-31 DIAGNOSIS — R0609 Other forms of dyspnea: Secondary | ICD-10-CM | POA: Diagnosis not present

## 2020-07-31 DIAGNOSIS — I2609 Other pulmonary embolism with acute cor pulmonale: Secondary | ICD-10-CM | POA: Diagnosis not present

## 2020-07-31 DIAGNOSIS — J449 Chronic obstructive pulmonary disease, unspecified: Secondary | ICD-10-CM | POA: Diagnosis not present

## 2020-07-31 DIAGNOSIS — J9611 Chronic respiratory failure with hypoxia: Secondary | ICD-10-CM | POA: Diagnosis not present

## 2020-08-05 ENCOUNTER — Ambulatory Visit (INDEPENDENT_AMBULATORY_CARE_PROVIDER_SITE_OTHER)
Admission: RE | Admit: 2020-08-05 | Discharge: 2020-08-05 | Disposition: A | Discharge status: Home / Self Care | Payer: PPO | Source: Ambulatory Visit | Attending: Acute Care | Admitting: Acute Care

## 2020-08-05 ENCOUNTER — Other Ambulatory Visit: Payer: Self-pay

## 2020-08-05 ENCOUNTER — Ambulatory Visit (INDEPENDENT_AMBULATORY_CARE_PROVIDER_SITE_OTHER): Payer: PPO | Admitting: Acute Care

## 2020-08-05 ENCOUNTER — Encounter: Payer: Self-pay | Admitting: Acute Care

## 2020-08-05 VITALS — BP 130/70 | HR 80 | Temp 97.3°F | Ht 72.0 in | Wt 205.4 lb

## 2020-08-05 DIAGNOSIS — Z87891 Personal history of nicotine dependence: Secondary | ICD-10-CM

## 2020-08-05 DIAGNOSIS — Z122 Encounter for screening for malignant neoplasm of respiratory organs: Secondary | ICD-10-CM

## 2020-08-05 NOTE — Progress Notes (Signed)
Shared Decision Making Visit Lung Cancer Screening Program [phone])   Eligibility:  Age 64 y.o.  Pack Years Smoking History Calculation 74 pack year smoking history (# packs/per year x # years smoked)  Recent History of coughing up blood  no  Unexplained weight loss? no ( >Than 15 pounds within the last 6 months )  Prior History Lung / other cancer no (Diagnosis within the last 5 years already requiring surveillance chest CT Scans).  Smoking Status Former Smoker  Former Smokers: Years since quit: 8 years  Quit Date: 2013  Visit Components:  Discussion included one or more decision making aids. yes  Discussion included risk/benefits of screening. yes  Discussion included potential follow up diagnostic testing for abnormal scans. yes  Discussion included meaning and risk of over diagnosis. yes  Discussion included meaning and risk of False Positives. yes  Discussion included meaning of total radiation exposure. yes  Counseling Included:  Importance of adherence to annual lung cancer LDCT screening. yes  Impact of comorbidities on ability to participate in the program. yes  Ability and willingness to under diagnostic treatment. yes  Smoking Cessation Counseling:  Current Smokers:   Discussed importance of smoking cessation. yes  Information about tobacco cessation classes and interventions provided to patient. no  Patient provided with "ticket" for LDCT Scan. yes  Symptomatic Patient. no  Counseling  Diagnosis Code: Tobacco Use Z72.0  Asymptomatic Patient yes  Counseling (Intermediate counseling: > three minutes counseling) O3500  Former Smokers:   Discussed the importance of maintaining cigarette abstinence. yes  Diagnosis Code: Personal History of Nicotine Dependence. X38.182  Information about tobacco cessation classes and interventions provided to patient. Yes  Patient provided with "ticket" for LDCT Scan. yes  Written Order for Lung Cancer  Screening with LDCT placed in Epic. Yes (CT Chest Lung Cancer Screening Low Dose W/O CM) [id number] Z12.2-Screening of respiratory organs Z87.891-Personal history of nicotine dependence  I spent 25 minutes of face to face time with [name] discussing the risks and benefits of lung cancer screening. We viewed a power point together that explained in detail the above noted topics. We took the time to pause the power point at intervals to allow for questions to be asked and answered to ensure understanding. We discussed that he had taken the single most powerful action possible to decrease his risk of developing lung cancer when he quit smoking. I counseled himto remain smoke free, and to contact me if he ever had the desire to smoke again so that I can provide resources and tools to help support the effort to remain smoke free. We discussed the time and location of the scan, and that either  Abigail Miyamoto RN or I will call with the results within  24-48 hours of receiving them. He has my card and contact information in the event He needs to speak with me, in addition to a copy of the power point we reviewed as a resource. He verbalized understanding of all of the above and had no further questions upon leaving the office.   BP 130/70 (BP Location: Left Arm, Cuff Size: Normal)   Pulse 80   Temp (!) 97.3 F (36.3 C) (Oral)   Ht 6' (1.829 m)   Wt 205 lb 6.4 oz (93.2 kg)   SpO2 98%   BMI 27.86 kg/m    I explained to the patient that there has been a high incidence of coronary artery disease noted on these exams. I explained that this is  a non-gated exam therefore degree or severity cannot be determined. This patient is currently on statin therapy. I have asked the patient to follow-up with their PCP regarding any incidental finding of coronary artery disease and management with diet or medication as they feel is clinically indicated. The patient verbalized understanding of the above and had no further  questions.  I did discuss with patient that he would not be a surgical candidate should we find an operable cancer. He verbalized understanding. We discussed the option of SBRT.   Bevelyn Ngo, NP 08/05/2020

## 2020-08-05 NOTE — Patient Instructions (Signed)
Thank you for participating in the [facility]. It was our pleasure to meet you today. We will call you with the results of your scan within the next few days. Your scan will be assigned a Lung RADS category score by the physicians reading the scans.  This Lung RADS score determines follow up scanning.  See below for description of categories, and follow up screening recommendations. We will be in touch to schedule your follow up screening annually or based on recommendations of our providers. We will fax a copy of your scan results to your Primary Care Physician, or the physician who referred you to the program, to ensure they have the results. Please call the office if you have any questions or concerns regarding your scanning experience or results.  Our office number is [phone]. Please speak with Denise Phelps, RN. She is our Lung Cancer Screening RN. If she is unavailable when you call, please have the office staff send her a message. She will return your call at her earliest convenience. Remember, if your scan is normal, we will scan you annually as long as you continue to meet the criteria for the program. (Age 55-77, Current smoker or smoker who has quit within the last 15 years). If you are a smoker, remember, quitting is the single most powerful action that you can take to decrease your risk of lung cancer and other pulmonary, breathing related problems. We know quitting is hard, and we are here to help.  Please let us know if there is anything we can do to help you meet your goal of quitting. If you are a former smoker, congratulations. We are proud of you! Remain smoke free! Remember you can refer friends or family members through the number above.  We will screen them to make sure they meet criteria for the program. Thank you for helping us take better care of you by participating in Lung Screening.  Lung RADS Categories:  Lung RADS 1: no nodules  or definitely non-concerning nodules.  Recommendation is for a repeat annual scan in 12 months.  Lung RADS 2:  nodules that are non-concerning in appearance and behavior with a very low likelihood of becoming an active cancer. Recommendation is for a repeat annual scan in 12 months.  Lung RADS 3: nodules that are probably non-concerning , includes nodules with a low likelihood of becoming an active cancer.  Recommendation is for a 6-month repeat screening scan. Often noted after an upper respiratory illness. We will be in touch to make sure you have no questions, and to schedule your 6-month scan.  Lung RADS 4 A: nodules with concerning findings, recommendation is most often for a follow up scan in 3 months or additional testing based on our provider's assessment of the scan. We will be in touch to make sure you have no questions and to schedule the recommended 3 month follow up scan.  Lung RADS 4 B:  indicates findings that are concerning. We will be in touch with you to schedule additional diagnostic testing based on our provider's  assessment of the scan.   

## 2020-08-07 ENCOUNTER — Encounter: Payer: Self-pay | Admitting: Primary Care

## 2020-08-07 ENCOUNTER — Other Ambulatory Visit: Payer: Self-pay

## 2020-08-07 ENCOUNTER — Ambulatory Visit (INDEPENDENT_AMBULATORY_CARE_PROVIDER_SITE_OTHER): Payer: PPO | Admitting: Primary Care

## 2020-08-07 ENCOUNTER — Telehealth: Payer: Self-pay | Admitting: Primary Care

## 2020-08-07 VITALS — BP 132/74 | HR 94 | Temp 97.9°F | Ht 76.0 in | Wt 207.0 lb

## 2020-08-07 DIAGNOSIS — J449 Chronic obstructive pulmonary disease, unspecified: Secondary | ICD-10-CM | POA: Diagnosis not present

## 2020-08-07 DIAGNOSIS — I2510 Atherosclerotic heart disease of native coronary artery without angina pectoris: Secondary | ICD-10-CM

## 2020-08-07 DIAGNOSIS — Z5181 Encounter for therapeutic drug level monitoring: Secondary | ICD-10-CM | POA: Diagnosis not present

## 2020-08-07 DIAGNOSIS — R918 Other nonspecific abnormal finding of lung field: Secondary | ICD-10-CM | POA: Diagnosis not present

## 2020-08-07 MED ORDER — AZITHROMYCIN 250 MG PO TABS: ORAL_TABLET | ORAL | 0 refills | Status: DC

## 2020-08-07 NOTE — Telephone Encounter (Signed)
Called and spoke with pt in regards to info from [employer] and mistake on AVS. Pt verbalized understanding. Pt's appt has been changed for him to see Dr. Tonia Brooms on 11/1. Nothing further needed.

## 2020-08-07 NOTE — Progress Notes (Signed)
@Patient  ID: , male    DOB: Aug 06, 1956, 64 y.o.   MRN: [id number]  Chief Complaint  Patient presents with  . Follow-up    Referring provider: [id number], MD  Synopsis: Has stage IV COPD (steroid dependent) and history of a pulmonary embolism after a hospitalization in 2015 formerly followed by Dr. 2016.  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 PMH significant for COPD GOLD IV, chronic respiratory failure, allergic rhinitis, CAD, PE, cardiac arrhythmia, MI, multiple pulmonary nodules, pre-diabetes, left total hip replacement. Patient of Dr. 2014. Symb/Spiriva changed to [facility] September 2021. Maintained on Albuterol hfa/nebulizer q6 hours, Singulair and prednisone 10mg  daily. Oxygen dependent 2-3L on exertion. Completed physical therapy some years ago.   Previous [facility] pulmonary encounter: 07/05/2019 Patient presents today 3-5 month follow-up visit. Breathing is no worse but feels it is progressing. Chronic cough with mucus production. Not much color. Still taking azithromycin but not the last couple of days because he ran out. Compliant with Symbicort and Spiriva. Using nebulizer every 6 hours. Prednisone is at 10mg  daily. Not currently using oxygen. Has a concentrator at home but needs service. Occasionally misses mucinex dosing.   09/28/2019 Admitted to the hospital from 10/21-11/9 for COPD exacerbation, acute respiratory failure and pneumoperitoneum postoperative ileus s/p exploratory laparotomy by Dr. without any findings. Difficult to extubate postoperatively and remained on ventilatory support for 48 hours before being  extubated on 10/28. CXR showed hyperinflation and emphysema without acute findings. Covid negative. He was discharged to SNF. Patient is still at facility. Reports that he is doing better than he was. Receiving PT/OT and feels he is making improvements. Still feels tired. Using 3L oxygen. Has productive cough, getting up mucus is the morning. Taking 1,200mg  mucinex twice daily. Continues taking Symbicort and Spiriva as directed.      11/09/19- Dr. Ezzard Standing Stage 4 severe COPD/chronic respiratory failure/PEx2 Medications refilled; continue Symbicort, Spiriva, Xarelto, singulair, prn albuterol and prednisone 10mg  daily. Follow-up 6 months.   01/24/2020 Patient contacted today for acute televisit. Reports home health nurse heard wheezing in lung fields. States that he has not noticed any increased wheezing himself. He has a chronic productive cough, no significant color. He has been slightly more short of breath over the last several days. He is compliant with Symb +Spiriva. Takes 1,200mg  mucinex twice daily.  Continues 10mg  chronic daily prednisone. Received second pfizer covid vaccine on march 8th.   05/30/2020 Patient contacted virtually today for regular follow-up. His breathing is baseline. Reports dyspnea with mild exertion. States that he can not walk more than a few feet without getting short of breath. He is compliant with Symbicort and Spiriva. On average he uses Albuterol nebulizer every 6 hours, states that using his nebulizer prior to bedtime helps him sleep better. He has usual cough which is productive. Mucus is clear. He has not had recent exacerbations requiring antibiotics. He is on prednisone 10mg  daily. He remains on 2L oxygen. He was on daily azithromycin for 6 months back in 2020 predscribed by Dr. . He is not taking azithromycin daily anymore. He is open to restarting this as he  feels it did help reduce exacerbations.   07/10/2020 Patient presents today for regular follow-up.  Accompanied by his sister, patient lives alone. Needs to requlify for oxygen today. DME company is [employer]. He uses 3L oxygen continuously. He reports that his breathing is baseline but he becomes very out of breath with the slightest amount of exertion. He continues to have congested cough with thick mucus. He is compliant with Symbicort 160 + Spiriva Handihaler. He has never been tried on [employer]. He uses Albuterol nebulizer 3-4 times a day which does help. He takes mucinex daily. He is not currently using flutter valve as this has causes him to pass out in the past.   08/07/2020 - Interim hx Patient presents today for 4 week follow-up. Patient is extremely limited by his COPD symptoms. He has severe dyspnea with minimal exertion and congested cough. During last visit we started patient on Breztri. Continue 10mg  prednisone and albuterol hfa/nebulizer q 6 hours. Sent in new prescription for nebulizer machine. Consider to resume Azithromycin MWF. He has been referred to lung cancer screening program and has an apt on 08/05/20.   He is accompanied by male today. States that 08/07/20 inhaler has been more convenient for him. Feels wheezing and congestion are better. He uses 3L oxygen at home, he re-qualified for oxygen during last visit. O2 today 93% RA at rest. Continues 10mg  prednisone. He is taking mucinex twice daily, not using flutter valve. He is not very active. LDCT 08/06/20 showed lung RADS 3. Discussed results, needs repeat CT in 6 months.   Imaging: 08/06/20 LDCT- Lungs/Pleura: Biapical pleural-parenchymal scarring, right greater than left.Moderate to severe centrilobular emphysematous changes, upper lung predominant.No focal consolidation.8.8 mm nodule in the medial right lung apex (image 55), similar to2018, although slightly more conspicuous and warranting follow-up  Allergies  Allergen Reactions  . Naproxen Sodium Itching    Immunization History  Administered Date(s) Administered   . Fluad Quad(high Dose 65+) 07/05/2019  . Influenza Split 08/10/2011, 07/27/2012  . Influenza,inj,Quad PF,6+ Mos 07/14/2013, 08/06/2014, 10/17/2015, 07/21/2016, 08/26/2017, 07/28/2018  . PFIZER SARS-COV-2 Vaccination 12/21/2019, 01/15/2020  . PPD Test 07/22/2017, 07/29/2017  . Pneumococcal Conjugate-13 08/06/2014  . Pneumococcal Polysaccharide-23 02/05/2012  . Tdap 09/03/2014, 07/14/2017    Past Medical History:  Diagnosis Date  . Anxiety   . Arthritis    "back" (01/24/2014)  . Asthma   . Chronic bronchitis (HCC)   . Cluster headache    "last bout was summer 2014; had them q spring 1991-2001" (01/24/2014)  . COPD (chronic obstructive pulmonary disease) (HCC)   . GERD (gastroesophageal reflux disease)   . NSTEMI (non-ST elevated myocardial infarction) (HCC) 01/2014  . On home oxygen therapy    "2L; 24/7" (01/24/2014)  . Pneumonia 06/2013; 01/2014  . Pulmonary embolism (HCC) 01/23/2014  . Seasonal allergies     Tobacco History: Social History   Tobacco Use  Smoking Status Former Smoker  . Packs/day: 2.00  . Years: 35.00  . Pack years: 70.00  . Types: Cigarettes  . Quit date: 01/24/2012  . Years since quitting: 8.5  Smokeless Tobacco Never Used  Tobacco Comment   Remain smoke free   Counseling given: Not Answered Comment: Remain smoke free   Outpatient Medications Prior to Visit  Medication Sig Dispense Refill  . albuterol (PROVENTIL) (2.5 MG/3ML) 0.083% nebulizer solution Take 3 mLs (2.5 mg total) by nebulization every 6 (six) hours as needed for wheezing or shortness of breath. 360 mL 5  . albuterol (VENTOLIN HFA)  108 (90 Base) MCG/ACT inhaler Inhale 2 puffs into the lungs every 6 (six) hours as needed for wheezing or shortness of breath. 8 g 11  . atorvastatin (LIPITOR) 10 MG tablet Take 1 tablet (10 mg total) by mouth daily. 90 tablet 3  . Budeson-Glycopyrrol-Formoterol (BREZTRI AEROSPHERE) 160-9-4.8 MCG/ACT AERO Inhale 2 puffs into the lungs 2 (two) times daily. 5.9 g  0  . citalopram (CELEXA) 20 MG tablet Take 1 tablet (20 mg total) by mouth daily. 90 tablet 3  . doxycycline (VIBRA-TABS) 100 MG tablet Take 1 tablet (100 mg total) by mouth 2 (two) times daily. 14 tablet 0  . fluticasone (FLONASE) 50 MCG/ACT nasal spray Place 1 spray into both nostrils daily. 9.9 mL 0  . guaiFENesin (MUCINEX) 600 MG 12 hr tablet Take 2 tablets (1,200 mg total) by mouth 2 (two) times daily. 120 tablet 0  . hydrOXYzine (ATARAX/VISTARIL) 10 MG tablet Take 1 tablet (10 mg total) by mouth 3 (three) times daily as needed. 90 tablet 2  . montelukast (SINGULAIR) 10 MG tablet TAKE 1 TABLET(10 MG) BY MOUTH AT BEDTIME 30 tablet 11  . Multiple Vitamin (MULTI VITAMIN PO) Take 1 tablet by mouth daily.    . OXYGEN Inhale 2 L/min into the lungs as needed. For sats less than 90%    . predniSONE (DELTASONE) 10 MG tablet Take 1 tablet (10 mg total) by mouth daily. 30 tablet 2  . rivaroxaban (XARELTO) 20 MG TABS tablet TAKE 1 TABLET(20 MG) BY MOUTH EVERY EVENING 30 tablet 3  . sodium chloride (OCEAN) 0.65 % SOLN nasal spray Place 1 spray into both nostrils 4 (four) times daily as needed for congestion. 15 mL 0  . triamterene-hydrochlorothiazide (MAXZIDE-25) 37.5-25 MG tablet TAKE 1 TABLET BY MOUTH DAILY 90 tablet 2  . Vitamin D, Ergocalciferol, (DRISDOL) 1.25 MG (50000 UNIT) CAPS capsule Take 1 capsule (50,000 Units total) by mouth every 7 (seven) days. 12 capsule 0   No facility-administered medications prior to visit.    Review of Systems  Review of Systems  Constitutional: Negative.   HENT: Positive for congestion.   Respiratory: Positive for cough and wheezing.     Physical Exam  BP 132/74 (BP Location: Right Arm, Cuff Size: Normal)   Pulse 94   Temp 97.9 F (36.6 C)   Ht 6\' 4"  (1.93 m)   Wt 207 lb (93.9 kg)   SpO2 93% Comment: RA  BMI 25.20 kg/m  Physical Exam Constitutional:      Appearance: Normal appearance.  Cardiovascular:     Rate and Rhythm: Normal rate and regular  rhythm.  Pulmonary:     Breath sounds: Wheezing and rhonchi present.  Mus[facility]:     [facility]  Neurological:     Mental Status: He is alert.      Lab Results:  CBC    Component Value Date/Time   WBC 12.1 (H) 06/03/2020 1713   RBC 4.39 06/03/2020 1713   HGB 13.6 06/03/2020 1713   HCT 39.9 06/03/2020 1713   PLT 398.0 06/03/2020 1713   MCV 90.9 06/03/2020 1713   MCH 31.2 09/14/2019 0513   MCHC 34.0 06/03/2020 1713   RDW 14.4 06/03/2020 1713   LYMPHSABS 1.0 06/03/2020 1713   MONOABS 0.8 06/03/2020 1713   EOSABS 0.1 06/03/2020 1713   BASOSABS 0.1 06/03/2020 1713    BMET    Component Value Date/Time   NA 130 (L) 06/03/2020 1713   NA 137 09/20/2019 0000   K 4.2 06/03/2020  1713   CL 93 (L) 06/03/2020 1713   CO2 26 06/03/2020 1713   GLUCOSE 114 (H) 06/03/2020 1713   BUN 11 06/03/2020 1713   BUN 20 09/20/2019 0000   CREATININE 0.85 06/03/2020 1713   CALCIUM 8.9 06/03/2020 1713   GFRNONAA 91 06/03/2020 1713   GFRAA 105 06/03/2020 1713    BNP    Component Value Date/Time   BNP 44.0 08/30/2019 2339    ProBNP    Component Value Date/Time   PROBNP 38.0 08/18/2018 1144    Imaging: CT CHEST LUNG CA SCREEN LOW DOSE W/O CM  Result Date: 08/06/2020 CLINICAL DATA:  64 year old male former smoker, quit 10 years ago, with 74 pack-year history of smoking, for initial lung cancer screening EXAM: CT CHEST WITHOUT CONTRAST LOW-DOSE FOR LUNG CANCER SCREENING TECHNIQUE: Multidetector CT imaging of the chest was performed following the standard protocol without IV contrast. COMPARISON:  CTA chest dated 12/03/2016 FINDINGS: Cardiovascular: Heart is normal in size.  No pericardial effusion. No evidence of thoracic aortic aneurysm. Atherosclerotic calcifications of the aortic arch. Three vessel coronary atherosclerosis. Mediastinum/Nodes: No suspicious mediastinal lymphadenopathy. Visualized thyroid is unremarkable. Lungs/Pleura: Biapical pleural-parenchymal scarring, right  greater than left. Moderate to severe centrilobular emphysematous changes, upper lung predominant. No focal consolidation. 8.8 mm nodule in the medial right lung apex (image 55), similar to 2018, although slightly more conspicuous and warranting follow-up. Linear scarring in the lateral right upper lobe (image 85). No pleural effusion or pneumothorax. Upper Abdomen: Visualized upper abdomen is grossly unremarkable, noting vascular calcifications. Musculoskeletal: Visualized osseous structures are within normal limits. IMPRESSION: Lung-RADS 3, probably benign findings. Short-term follow-up in 6 months is recommended with repeat low-dose chest CT without contrast (please use the following order, "CT CHEST LCS NODULE FOLLOW-UP W/O CM"). 8.8 mm nodule in the medial right lung apex, similar to 2018, although slightly more conspicuous and warranting follow-up. Aortic Atherosclerosis (ICD10-I70.0) and Emphysema (ICD10-J43.9). Electronically Signed   By: Charline Bills M.D.   On: 08/06/2020 12:14     Assessment & Plan:   COPD GOLD IV  - Patient is extremely limited by COPD symptoms. At baseline he has severe dyspnea with minimal exertion and chest congestion. Breathing remains baseline, he reports some improvement in wheezing and congestion with additional of Breztri inhaler. He was noted to have moderate to severe centrilobular emphysematous changes on LDCT. EKG completed today, QTC <500. Resuming azithromycin MWF to reduce exacerbations. Patient received second COVID vaccine in March 2021.   Plan: - Continue Breztri two puffs twice daily - Continue Mucinex 600mg  twice daily - Continue Prednisone 10mg  daily. - Re-Start azithromycin 250mg  MWF - Use flutter valve 5-10 breaths three times a day on easiest setting  - FU in 1 month, needs high dose influenza vaccine at follow-up    Multiple lung nodules - LDCT 08/06/20 showed lung RADS 3. He has 8.8 mm nodule in the medial right lung apex, similar to 2018,  although slightly more conspicuous. Discussed results with patient, needs repeat CT in 6 months.    CAD (coronary artery disease) - Three vessel atherosclerosis on imaging, patient is on Lipitor 10mg  daily      , NP 08/07/2020

## 2020-08-07 NOTE — Patient Instructions (Addendum)
Recommendations: - Continue Breztri two puffs twice daily (rinse mouth after use) - Continue Mucinex 600mg  twice daily - Continue Prednisone 10mg  daily  - Start azithromycin MWF - Use flutter valve 5-10 breaths three times a day on easiest setting   Orders: - EKG re: medication monitoring  RX: - Azithromycin 250mg  once daily on Monday/Wednesday/Friday   Follow-up: - 1 month with Dr. or APP if no availability

## 2020-08-07 NOTE — Telephone Encounter (Signed)
I wrote on AVS for patient to follow-up with [facility] in 1 month. His primary pulmonary MD is Dr. Tonia Brooms, can we get follow-up changed. Thanks

## 2020-08-07 NOTE — Progress Notes (Signed)
These results were reviewed with the patient 9/29 in the office by Buelah Manis NP.  Angelique Blonder, please order a 6 month follow up scan, and fax results to PCP. Thanks so much.

## 2020-08-07 NOTE — Assessment & Plan Note (Signed)
-   LDCT 08/06/20 showed lung RADS 3. He has 8.8 mm nodule in the medial right lung apex, similar to 2018, although slightly more conspicuous. Discussed results with patient, needs repeat CT in 6 months.

## 2020-08-07 NOTE — Assessment & Plan Note (Addendum)
-   Patient is extremely limited by COPD symptoms. At baseline he has severe dyspnea with minimal exertion and chest congestion. Breathing remains baseline, he reports some improvement in wheezing and congestion with additional of Breztri inhaler. He was noted to have moderate to severe centrilobular emphysematous changes on LDCT. EKG completed today, QTC <500. Resuming azithromycin MWF to reduce exacerbations. Patient received second COVID vaccine in March 2021.   Plan: - Continue Breztri two puffs twice daily - Continue Mucinex 600mg  twice daily - Continue Prednisone 10mg  daily. - Re-Start azithromycin 250mg  MWF - Use flutter valve 5-10 breaths three times a day on easiest setting  - FU in 1 month, needs high dose influenza vaccine at follow-up

## 2020-08-07 NOTE — Assessment & Plan Note (Signed)
-   Three vessel atherosclerosis on imaging, patient is on Lipitor 10mg  daily

## 2020-08-08 ENCOUNTER — Other Ambulatory Visit: Payer: Self-pay | Admitting: *Deleted

## 2020-08-08 MED ORDER — BREZTRI AEROSPHERE 160-9-4.8 MCG/ACT IN AERO: 2.0000 | INHALATION_SPRAY | Freq: Two times a day (BID) | RESPIRATORY_TRACT | 5 refills | Status: AC

## 2020-08-09 ENCOUNTER — Other Ambulatory Visit: Payer: Self-pay | Admitting: *Deleted

## 2020-08-09 DIAGNOSIS — Z87891 Personal history of nicotine dependence: Secondary | ICD-10-CM

## 2020-08-14 NOTE — Progress Notes (Signed)
Thanks for seeing him  Josephine Igo, DO [facility] 08/14/2020 11:44 PM

## 2020-08-30 DIAGNOSIS — J9611 Chronic respiratory failure with hypoxia: Secondary | ICD-10-CM | POA: Diagnosis not present

## 2020-08-30 DIAGNOSIS — J9610 Chronic respiratory failure, unspecified whether with hypoxia or hypercapnia: Secondary | ICD-10-CM | POA: Diagnosis not present

## 2020-08-30 DIAGNOSIS — R0609 Other forms of dyspnea: Secondary | ICD-10-CM | POA: Diagnosis not present

## 2020-08-30 DIAGNOSIS — I2609 Other pulmonary embolism with acute cor pulmonale: Secondary | ICD-10-CM | POA: Diagnosis not present

## 2020-08-30 DIAGNOSIS — J449 Chronic obstructive pulmonary disease, unspecified: Secondary | ICD-10-CM | POA: Diagnosis not present

## 2020-09-05 ENCOUNTER — Ambulatory Visit: Payer: PPO | Admitting: Primary Care

## 2020-09-09 ENCOUNTER — Other Ambulatory Visit: Payer: Self-pay

## 2020-09-09 ENCOUNTER — Encounter: Payer: Self-pay | Admitting: Pulmonary Disease

## 2020-09-09 ENCOUNTER — Ambulatory Visit: Payer: PPO | Admitting: Pulmonary Disease

## 2020-09-09 VITALS — BP 130/68 | HR 108 | Temp 97.9°F | Ht 76.0 in | Wt 203.6 lb

## 2020-09-09 DIAGNOSIS — J441 Chronic obstructive pulmonary disease with (acute) exacerbation: Secondary | ICD-10-CM | POA: Diagnosis not present

## 2020-09-09 DIAGNOSIS — J9611 Chronic respiratory failure with hypoxia: Secondary | ICD-10-CM | POA: Diagnosis not present

## 2020-09-09 DIAGNOSIS — R911 Solitary pulmonary nodule: Secondary | ICD-10-CM

## 2020-09-09 DIAGNOSIS — J449 Chronic obstructive pulmonary disease, unspecified: Secondary | ICD-10-CM | POA: Diagnosis not present

## 2020-09-09 MED ORDER — AMOXICILLIN-POT CLAVULANATE 875-125 MG PO TABS: 1.0000 | ORAL_TABLET | Freq: Two times a day (BID) | ORAL | 0 refills | Status: AC

## 2020-09-09 MED ORDER — PREDNISONE 10 MG PO TABS: ORAL_TABLET | ORAL | 0 refills | Status: DC

## 2020-09-09 NOTE — Progress Notes (Signed)
Synopsis: Referred in 09/09/2020 for COPD by [name], MD  Subjective:   PATIENT ID: Theodore [name] GENDER: male DOB: [birth year], MRN: [id number]  Chief Complaint  Patient presents with  . Follow-up    Patient has been having good and bad days since last visit, productive cough with clear/white sputum. Wears 2 liters oxygen all the time. Gets out of breath very quick    64 yo PMH chronic bronchitis and COPD, on 2L [place]. Currently managed with Breztri, prn albuterol, prednisone 10mg  daily and azithromycin 250mg  MWF.  Patient had recent exacerbation treated with antibiotics and steroids after seeing in clinic.  Longstanding history of COPD.  Currently on triple therapy inhaler regimen and chronic prednisone.  Still having daily sputum production.  Really been feeling weak for the past several days.  Increased cough sputum production low-grade fevers.  Denies hemoptysis  Prior pulmonary function test with stage IV COPD.  In 2018.   Past Medical History:  Diagnosis Date  . Anxiety   . Arthritis    "back" (01/24/2014)  . Asthma   . Chronic bronchitis (HCC)   . Cluster headache    "last bout was summer 2014; had them q spring 1991-2001" (01/24/2014)  . COPD (chronic obstructive pulmonary disease) (HCC)   . GERD (gastroesophageal reflux disease)   . NSTEMI (non-ST elevated myocardial infarction) (HCC) 01/2014  . On home oxygen therapy    "2L; 24/7" (01/24/2014)  . Pneumonia 06/2013; 01/2014  . Pulmonary embolism (HCC) 01/23/2014  . Seasonal allergies      Family History  Problem Relation Age of Onset  . Leukemia Father   . Heart disease Father   . Asthma Father   . Asthma Sister   . Emphysema Sister   . Heart disease Sister   . High blood pressure Mother   . Colon cancer Cousin        First cousin     Past Surgical History:  Procedure Laterality Date  . COLONOSCOPY WITH PROPOFOL N/A 10/20/2016   Procedure: COLONOSCOPY WITH PROPOFOL;  Surgeon: 01/25/2014,  MD;  Location: [facility];  Service: Gastroenterology;  Laterality: N/A;  . LAPAROTOMY N/A 09/04/2019   Procedure: EXPLORATORY LAPAROTOMY;  Surgeon: Sherrilyn Rist, MD;  Location: [facility];  Service: General;  Laterality: N/A;  . NASAL SEPTOPLASTY W/ TURBINOPLASTY Left 1983  . TOTAL HIP ARTHROPLASTY Left 07/16/2017   Procedure: TOTAL HIP ARTHROPLASTY ANTERIOR APPROACH;  Surgeon: Ovidio Kin, MD;  Location: [facility];  Service: Orthopedics;  Laterality: Left;  . TURBINATE REDUCTION Bilateral ~ 1983    Social History   Socioeconomic History  . Marital status: Divorced    Spouse name: Not on file  . Number of children: 0  . Years of education: 30  . Highest education level: Not on file  Occupational History  . Occupation: Disability    Employer: [employer]  Tobacco Use  . Smoking status: Former Smoker    Packs/day: 2.00    Years: 35.00    Pack years: 70.00    Types: Cigarettes    Quit date: 01/24/2012    Years since quitting: 8.6  . Smokeless tobacco: Never Used  . Tobacco comment: Remain smoke free  Vaping Use  . Vaping Use: Never used  Substance and Sexual Activity  . Alcohol use: Not Currently  . Drug use: No  . Sexual activity: Never  Other Topics Concern  . Not on file  Social History Narrative  Born in [place], [place] and moved around a lot. Dad was an Art gallery manager and moved frequently.      Social Determinants of Health   Financial Resource Strain:   . Difficulty of Paying Living Expenses: Not on file  Food Insecurity:   . Worried About Programme researcher, broadcasting/film/video in the Last Year: Not on file  . Ran Out of Food in the Last Year: Not on file  Transportation Needs:   . Lack of Transportation (Medical): Not on file  . Lack of Transportation (Non-Medical): Not on file  Physical Activity:   . Days of Exercise per Week: Not on file  . Minutes of Exercise per Session: Not on file  Stress:   . Feeling of Stress : Not on file  Social Connections:   . Frequency of  Communication with Friends and Family: Not on file  . Frequency of Social Gatherings with Friends and Family: Not on file  . Attends Religious Services: Not on file  . Active Member of Clubs or Organizations: Not on file  . Attends Banker Meetings: Not on file  . Marital Status: Not on file  Intimate Partner Violence:   . Fear of Current or Ex-Partner: Not on file  . Emotionally Abused: Not on file  . Physically Abused: Not on file  . Sexually Abused: Not on file     Allergies  Allergen Reactions  . Naproxen Sodium Itching     Outpatient Medications Prior to Visit  Medication Sig Dispense Refill  . albuterol (PROVENTIL) (2.5 MG/3ML) 0.083% nebulizer solution Take 3 mLs (2.5 mg total) by nebulization every 6 (six) hours as needed for wheezing or shortness of breath. 360 mL 5  . albuterol (VENTOLIN HFA) 108 (90 Base) MCG/ACT inhaler Inhale 2 puffs into the lungs every 6 (six) hours as needed for wheezing or shortness of breath. 8 g 11  . atorvastatin (LIPITOR) 10 MG tablet Take 1 tablet (10 mg total) by mouth daily. 90 tablet 3  . azithromycin (ZITHROMAX) 250 MG tablet Written Order reads: Azithromycin 250 mg once daily on Monday, Wednesday, Friday 21 tablet 0  . Budeson-Glycopyrrol-Formoterol (BREZTRI AEROSPHERE) 160-9-4.8 MCG/ACT AERO Inhale 2 puffs into the lungs 2 (two) times daily. 10.7 g 5  . citalopram (CELEXA) 20 MG tablet Take 1 tablet (20 mg total) by mouth daily. 90 tablet 3  . doxycycline (VIBRA-TABS) 100 MG tablet Take 1 tablet (100 mg total) by mouth 2 (two) times daily. 14 tablet 0  . fluticasone (FLONASE) 50 MCG/ACT nasal spray Place 1 spray into both nostrils daily. 9.9 mL 0  . guaiFENesin (MUCINEX) 600 MG 12 hr tablet Take 2 tablets (1,200 mg total) by mouth 2 (two) times daily. 120 tablet 0  . hydrOXYzine (ATARAX/VISTARIL) 10 MG tablet Take 1 tablet (10 mg total) by mouth 3 (three) times daily as needed. 90 tablet 2  . montelukast (SINGULAIR) 10 MG tablet  TAKE 1 TABLET(10 MG) BY MOUTH AT BEDTIME 30 tablet 11  . Multiple Vitamin (MULTI VITAMIN PO) Take 1 tablet by mouth daily.    . OXYGEN Inhale 2 L/min into the lungs as needed. For sats less than 90%    . predniSONE (DELTASONE) 10 MG tablet Take 1 tablet (10 mg total) by mouth daily. 30 tablet 2  . rivaroxaban (XARELTO) 20 MG TABS tablet TAKE 1 TABLET(20 MG) BY MOUTH EVERY EVENING 30 tablet 3  . sodium chloride (OCEAN) 0.65 % SOLN nasal spray Place 1 spray into both nostrils 4 (four)  times daily as needed for congestion. 15 mL 0  . triamterene-hydrochlorothiazide (MAXZIDE-25) 37.5-25 MG tablet TAKE 1 TABLET BY MOUTH DAILY 90 tablet 2  . Vitamin D, Ergocalciferol, (DRISDOL) 1.25 MG (50000 UNIT) CAPS capsule Take 1 capsule (50,000 Units total) by mouth every 7 (seven) days. 12 capsule 0   No facility-administered medications prior to visit.    Review of Systems  Constitutional: Negative for chills, fever, malaise/fatigue and weight loss.  HENT: Negative for hearing loss, sore throat and tinnitus.   Eyes: Negative for blurred vision and double vision.  Respiratory: Positive for cough, sputum production and shortness of breath. Negative for hemoptysis, wheezing and stridor.   Cardiovascular: Negative for chest pain, palpitations, orthopnea, leg swelling and PND.  Gastrointestinal: Negative for abdominal pain, constipation, diarrhea, heartburn, nausea and vomiting.  Genitourinary: Negative for dysuria, hematuria and urgency.  Musculoskeletal: Negative for joint pain and myalgias.  Skin: Negative for itching and rash.  Neurological: Negative for dizziness, tingling, weakness and headaches.  Endo/Heme/Allergies: Negative for environmental allergies. Does not bruise/bleed easily.  Psychiatric/Behavioral: Negative for depression. The patient is not nervous/anxious and does not have insomnia.   All other systems reviewed and are negative.    Objective:  Physical Exam Vitals reviewed.   Constitutional:      General: He is not in acute distress.    Appearance: He is well-developed. He is obese.  HENT:     Head: Normocephalic and atraumatic.  Eyes:     General: No scleral icterus.    Conjunctiva/sclera: Conjunctivae normal.     Pupils: Pupils are equal, round, and reactive to light.  Neck:     Vascular: No JVD.     Trachea: No tracheal deviation.  Cardiovascular:     Rate and Rhythm: Normal rate and regular rhythm.     Heart sounds: Normal heart sounds. No murmur heard.   Pulmonary:     Effort: Pulmonary effort is normal. No tachypnea, accessory muscle usage or respiratory distress.     Breath sounds: No stridor. Rhonchi present. No wheezing or rales.  Abdominal:     General: Bowel sounds are normal. There is no distension.     Palpations: Abdomen is soft.     Tenderness: There is no abdominal tenderness.  Musculoskeletal:        General: No tenderness.     Cervical back: Neck supple.  Lymphadenopathy:     Cervical: No cervical adenopathy.  Skin:    General: Skin is warm and dry.     Capillary Refill: Capillary refill takes less than 2 seconds.     Findings: No rash.  Neurological:     Mental Status: He is alert and oriented to person, place, and time.  Psychiatric:        Behavior: Behavior normal.      Vitals:   09/09/20 1452  BP: 130/68  Pulse: (!) 108  Temp: 97.9 F (36.6 C)  TempSrc: Temporal  SpO2: 98%  Weight: 203 lb 9.6 oz (92.4 kg)  Height: 6\' 4"  (1.93 m)   98% on 2 L nasal cannula BMI Readings from Last 3 Encounters:  09/09/20 24.78 kg/m  08/07/20 25.20 kg/m  08/05/20 27.86 kg/m   Wt Readings from Last 3 Encounters:  09/09/20 203 lb 9.6 oz (92.4 kg)  08/07/20 207 lb (93.9 kg)  08/05/20 205 lb 6.4 oz (93.2 kg)     CBC    Component Value Date/Time   WBC 12.1 (H) 06/03/2020 1713   RBC 4.39 06/03/2020  1713   HGB 13.6 06/03/2020 1713   HCT 39.9 06/03/2020 1713   PLT 398.0 06/03/2020 1713   MCV 90.9 06/03/2020 1713    MCH 31.2 09/14/2019 0513   MCHC 34.0 06/03/2020 1713   RDW 14.4 06/03/2020 1713   LYMPHSABS 1.0 06/03/2020 1713   MONOABS 0.8 06/03/2020 1713   EOSABS 0.1 06/03/2020 1713   BASOSABS 0.1 06/03/2020 1713     Chest Imaging: 08/06/2020 lung cancer screening CT nodule follow-up: 8 mm nodule within the right medial lung apex.  Currently following. The patient's images have been independently reviewed by me.    Pulmonary Functions Testing Results: PFT Results Latest Ref Rng & Units 01/22/2017  FVC-Pre L 2.34  FVC-Predicted Pre % 40  FVC-Post L 2.60  FVC-Predicted Post % 45  Pre FEV1/FVC % % 30  Post FEV1/FCV % % 33  FEV1-Pre L 0.71  FEV1-Predicted Pre % 16  FEV1-Post L 0.85  DLCO uncorrected ml/min/mmHg 15.30  DLCO UNC% % 37  DLCO corrected ml/min/mmHg 15.01  DLCO COR %Predicted % 37  DLVA Predicted % 46    FeNO:   Pathology:   Echocardiogram:   Heart Catheterization:     Assessment & Plan:     ICD-10-CM   1. COPD mixed type (HCC)  J44.9 Amb Referral to Palliative Care  2. Stage 3 severe COPD by GOLD classification (HCC)  J44.9   3. COPD with acute exacerbation (HCC)  J44.1   4. Chronic respiratory failure with hypoxia (HCC)  J96.11   5. Right upper lobe pulmonary nodule  R91.1     Discussion: This is a 64 year old gentleman with chronic hypoxemic respiratory failure, debilitated at baseline mMRC 4, severe COPD on previous pulmonary function tests in 2018.  He has chronic bronchitis with a daily wet sputum production.  Worsening for the past week or more.  He does live alone.  Today in the office we also discussed his goals of care please see documentation below.  Plan: We will start patient on Augmentin 875 twice daily x14 days Start prednisone taper 40 mg x 4 days decrease by 10 until down to 10 mg daily Stay on 10 mg prednisone daily Once off Augmentin can restart azithromycin 250 mg Monday Wednesday Friday Continue current triple therapy inhaler  regimen Continue as needed albuterol for shortness of breath and wheezing.   PCCM Goals of Care Discussion and Advanced Care Planning:   Date: 09/09/2020   Present Parties: Patient plus patient's sister  What was discussed: We discussed the patient's overall goals.  He would like to avoid hospitalizations at all cost.  He also like to avoid the intensive care unit.  Okay for other treatments.  He does have a durable DNR already signed and posted on the back of his house door.  He does not have a [facility] [place] [facility] [place] form.  Outcome: Decision was made to fill [facility].  This was completed signed and a copy placed in the patient's advanced directive section of the medical record.  16 mins of time was spent discussing the goals of care, advanced care planning options such as code status as well as do not resuscitate forms. [id number])     Current Outpatient Medications:  .  albuterol (PROVENTIL) (2.5 MG/3ML) 0.083% nebulizer solution, Take 3 mLs (2.5 mg total) by nebulization every 6 (six) hours as needed for wheezing or shortness of breath., Disp: 360 mL, Rfl: 5 .  albuterol (VENTOLIN HFA) 108 (90 Base) MCG/ACT  inhaler, Inhale 2 puffs into the lungs every 6 (six) hours as needed for wheezing or shortness of breath., Disp: 8 g, Rfl: 11 .  atorvastatin (LIPITOR) 10 MG tablet, Take 1 tablet (10 mg total) by mouth daily., Disp: 90 tablet, Rfl: 3 .  azithromycin (ZITHROMAX) 250 MG tablet, Written Order reads: Azithromycin 250 mg once daily on Monday, Wednesday, Friday, Disp: 21 tablet, Rfl: 0 .  Budeson-Glycopyrrol-Formoterol (BREZTRI AEROSPHERE) 160-9-4.8 MCG/ACT AERO, Inhale 2 puffs into the lungs 2 (two) times daily., Disp: 10.7 g, Rfl: 5 .  citalopram (CELEXA) 20 MG tablet, Take 1 tablet (20 mg total) by mouth daily., Disp: 90 tablet, Rfl: 3 .  doxycycline (VIBRA-TABS) 100 MG tablet, Take 1 tablet (100 mg total) by mouth 2 (two) times daily., Disp: 14 tablet, Rfl: 0 .   fluticasone (FLONASE) 50 MCG/ACT nasal spray, Place 1 spray into both nostrils daily., Disp: 9.9 mL, Rfl: 0 .  guaiFENesin (MUCINEX) 600 MG 12 hr tablet, Take 2 tablets (1,200 mg total) by mouth 2 (two) times daily., Disp: 120 tablet, Rfl: 0 .  hydrOXYzine (ATARAX/VISTARIL) 10 MG tablet, Take 1 tablet (10 mg total) by mouth 3 (three) times daily as needed., Disp: 90 tablet, Rfl: 2 .  montelukast (SINGULAIR) 10 MG tablet, TAKE 1 TABLET(10 MG) BY MOUTH AT BEDTIME, Disp: 30 tablet, Rfl: 11 .  Multiple Vitamin (MULTI VITAMIN PO), Take 1 tablet by mouth daily., Disp: , Rfl:  .  OXYGEN, Inhale 2 L/min into the lungs as needed. For sats less than 90%, Disp: , Rfl:  .  predniSONE (DELTASONE) 10 MG tablet, Take 1 tablet (10 mg total) by mouth daily., Disp: 30 tablet, Rfl: 2 .  rivaroxaban (XARELTO) 20 MG TABS tablet, TAKE 1 TABLET(20 MG) BY MOUTH EVERY EVENING, Disp: 30 tablet, Rfl: 3 .  sodium chloride (OCEAN) 0.65 % SOLN nasal spray, Place 1 spray into both nostrils 4 (four) times daily as needed for congestion., Disp: 15 mL, Rfl: 0 .  triamterene-hydrochlorothiazide (MAXZIDE-25) 37.5-25 MG tablet, TAKE 1 TABLET BY MOUTH DAILY, Disp: 90 tablet, Rfl: 2 .  Vitamin D, Ergocalciferol, (DRISDOL) 1.25 MG (50000 UNIT) CAPS capsule, Take 1 capsule (50,000 Units total) by mouth every 7 (seven) days., Disp: 12 capsule, Rfl: 0  I spent 42 minutes dedicated to the care of this patient on the date of this encounter to include pre-visit review of records, face-to-face time with the patient discussing conditions above, post visit ordering of testing, clinical documentation with the electronic health record, making appropriate referrals as documented, and communicating necessary findings to members of the patients care team.   Josephine Igo, DO [facility] 09/09/2020 3:06 PM

## 2020-09-09 NOTE — Patient Instructions (Addendum)
Thank you for visiting Dr. Tonia Brooms at [facility]. Today we recommend the following:  Orders Placed This Encounter  Procedures  . Amb Referral to Palliative Care   Reviewed and signed [place] MOLST FORM   Meds ordered this encounter  Medications  . amoxicillin-clavulanate (AUGMENTIN) 875-125 MG tablet    Sig: Take 1 tablet by mouth 2 (two) times daily for 14 days.    Dispense:  28 tablet    Refill:  0  . predniSONE (DELTASONE) 10 MG tablet    Sig: Take 4 tabs by mouth once daily x4 days, then 3 tabs x4 days, 2 tabs x4 days, 1 tab x4 days and stop.    Dispense:  40 tablet    Refill:  0   Return in about 6 weeks (around 10/21/2020) for with Beth, or Dr. Tonia Brooms.    Please do your part to reduce the spread of COVID-19.

## 2020-09-24 ENCOUNTER — Telehealth: Payer: Self-pay

## 2020-09-24 NOTE — Telephone Encounter (Signed)
Spoke with patient and have scheduled an In-person Consult for 10/10/20 @ 10:30  AM.  COVID screening was negative. Patient does have SOB, cough and congestion. He has a Dx of COPD.  No pets in home.   Consent obtained; updated Outlook/Netsmart/Team List and Epic.

## 2020-09-30 DIAGNOSIS — J449 Chronic obstructive pulmonary disease, unspecified: Secondary | ICD-10-CM | POA: Diagnosis not present

## 2020-09-30 DIAGNOSIS — J9610 Chronic respiratory failure, unspecified whether with hypoxia or hypercapnia: Secondary | ICD-10-CM | POA: Diagnosis not present

## 2020-09-30 DIAGNOSIS — I2609 Other pulmonary embolism with acute cor pulmonale: Secondary | ICD-10-CM | POA: Diagnosis not present

## 2020-09-30 DIAGNOSIS — R0609 Other forms of dyspnea: Secondary | ICD-10-CM | POA: Diagnosis not present

## 2020-09-30 DIAGNOSIS — J9611 Chronic respiratory failure with hypoxia: Secondary | ICD-10-CM | POA: Diagnosis not present

## 2020-10-10 ENCOUNTER — Other Ambulatory Visit: Payer: Self-pay

## 2020-10-10 ENCOUNTER — Other Ambulatory Visit: Payer: PPO | Admitting: Internal Medicine

## 2020-10-11 ENCOUNTER — Other Ambulatory Visit: Payer: Self-pay | Admitting: Internal Medicine

## 2020-10-11 MED ORDER — CITALOPRAM HYDROBROMIDE 20 MG PO TABS: 30.0000 mg | ORAL_TABLET | Freq: Every day | ORAL | 3 refills | Status: AC

## 2020-10-11 NOTE — Progress Notes (Signed)
Therapist, nutritional Palliative Care Consult Note Telephone: [phone]  Fax: [phone]  PATIENT NAME: Theodore [name] DOB: 03-Oct-1956 MRN: [id number]  PRIMARY CARE PROVIDER:   [name], MD  REFERRING PROVIDER:  [name], MD [address] [place],  [place] [address]  RESPONSIBLE PARTY:    Self    RECOMMENDATIONS and PLAN:  Palliative care encounter  Z51.5  1.  Advance care planning:  Explanation of palliative and hospice care.  Pt's goals are to attempt to stabilize his dyspnea on a daily basis with limiting activities at home, lessening anxiety and continuing medications as currently prescribed.  Advanced directives were previously completed and posted in pt's home.  His delegations are DNAR, Full scope of treatment without an extended use of life supportive devices,  Antibiotics and IV fluids if indicated.  No feeding tube. Consider transition to Hospice care if patient has potential life prognosis of < 6 months with natural trajectory of pulmonary disease.  Patient was provided with [facility] documents for his completion.  He understands that the completed document will need to be notorized.    2.  Respiratory failure with hypoxia:  Continue use of supplemental oxygen, all respiratory prescriptions.  Fan(on low)  placement on opposite side of bed.  Consider increase Prednisone dose to 10mg  BID.  Improve management of chronic anxiety.  3.  Anxiety:  Consider increasing daily dose of Citalopram from 20 to 40mg . (Will contact PCP related to same).      I spent 60 minutes providing this consultation,  from 1030 to 1130. More than 50% of the time in this consultation was spent coordinating communication with patient.   HISTORY OF PRESENT ILLNESS:  Theodore [name] is a 64 y.o. year old male with multiple medical problems including COPD, . Palliative Care was asked to help address goals of care.   CODE STATUS:  DNAR  PPS: 40% very weak  HOSPICE  ELIGIBILITY/DIAGNOSIS: TBD  PAST MEDICAL HISTORY:  Past Medical History:  Diagnosis Date  . Anxiety   . Arthritis    "back" (01/24/2014)  . Asthma   . Chronic bronchitis (HCC)   . Cluster headache    "last bout was summer 2014; had them q spring 1991-2001" (01/24/2014)  . COPD (chronic obstructive pulmonary disease) (HCC)   . GERD (gastroesophageal reflux disease)   . NSTEMI (non-ST elevated myocardial infarction) (HCC) 01/2014  . On home oxygen therapy    "2L; 24/7" (01/24/2014)  . Pneumonia 06/2013; 01/2014  . Pulmonary embolism (HCC) 01/23/2014  . Seasonal allergies     SOCIAL HX:  Divorced.  Lives at home alone. Social History   Tobacco Use  . Smoking status: Former Smoker    Packs/day: 2.00    Years: 35.00    Pack years: 70.00    Types: Cigarettes    Quit date: 01/24/2012    Years since quitting: 8.7  . Smokeless tobacco: Never Used  . Tobacco comment: Remain smoke free  Substance Use Topics  . Alcohol use: Not Currently    ALLERGIES:  Allergies  Allergen Reactions  . Naproxen Sodium Itching     PERTINENT MEDICATIONS:  Outpatient Encounter Medications as of 10/10/2020  Medication Sig  . albuterol (PROVENTIL) (2.5 MG/3ML) 0.083% nebulizer solution Take 3 mLs (2.5 mg total) by nebulization every 6 (six) hours as needed for wheezing or shortness of breath.  01/26/2012 albuterol (VENTOLIN HFA) 108 (90 Base) MCG/ACT inhaler Inhale 2 puffs into the lungs every 6 (  six) hours as needed for wheezing or shortness of breath.  Marland Kitchen atorvastatin (LIPITOR) 10 MG tablet Take 1 tablet (10 mg total) by mouth daily.  Marland Kitchen azithromycin (ZITHROMAX) 250 MG tablet Written Order reads: Azithromycin 250 mg once daily on Monday, Wednesday, Friday  . Budeson-Glycopyrrol-Formoterol (BREZTRI AEROSPHERE) 160-9-4.8 MCG/ACT AERO Inhale 2 puffs into the lungs 2 (two) times daily.  Marland Kitchen doxycycline (VIBRA-TABS) 100 MG tablet Take 1 tablet (100 mg total) by mouth 2 (two) times daily.  . fluticasone (FLONASE) 50 MCG/ACT  nasal spray Place 1 spray into both nostrils daily.  Marland Kitchen guaiFENesin (MUCINEX) 600 MG 12 hr tablet Take 2 tablets (1,200 mg total) by mouth 2 (two) times daily.  . hydrOXYzine (ATARAX/VISTARIL) 10 MG tablet Take 1 tablet (10 mg total) by mouth 3 (three) times daily as needed.  . montelukast (SINGULAIR) 10 MG tablet TAKE 1 TABLET(10 MG) BY MOUTH AT BEDTIME  . Multiple Vitamin (MULTI VITAMIN PO) Take 1 tablet by mouth daily.  . OXYGEN Inhale 2 L/min into the lungs as needed. For sats less than 90%  . predniSONE (DELTASONE) 10 MG tablet Take 1 tablet (10 mg total) by mouth daily.  . predniSONE (DELTASONE) 10 MG tablet Take 4 tabs by mouth once daily x4 days, then 3 tabs x4 days, 2 tabs x4 days, 1 tab x4 days and stop.  . rivaroxaban (XARELTO) 20 MG TABS tablet TAKE 1 TABLET(20 MG) BY MOUTH EVERY EVENING  . sodium chloride (OCEAN) 0.65 % SOLN nasal spray Place 1 spray into both nostrils 4 (four) times daily as needed for congestion.  . triamterene-hydrochlorothiazide (MAXZIDE-25) 37.5-25 MG tablet TAKE 1 TABLET BY MOUTH DAILY  . Vitamin D, Ergocalciferol, (DRISDOL) 1.25 MG (50000 UNIT) CAPS capsule Take 1 capsule (50,000 Units total) by mouth every 7 (seven) days.  . [DISCONTINUED] citalopram (CELEXA) 20 MG tablet Take 1 tablet (20 mg total) by mouth daily.   No facility-administered encounter medications on file as of 10/10/2020.    PHYSICAL EXAM:   General: In moderate distress, frail and fragile appearing. Unkempt Cardiovascular: regular rate and rhythm Pulmonary: Dyspnea with forced speech. Diminished breath sounds throughout with expiratory wheezing of RUL,RLL and LUL.  O2 via [place] in use at 2.5L/min Abdomen: soft, nontender, + bowel sounds Extremities:  1+ edema LLE and trace edema RLE.  Compression socks in use Skin: very dry.  Exposed skin intact Neurological: Generalized weakness, a&o x3.   Psych:  Anxious mood  Margaretha Sheffield, NP-C

## 2020-10-13 ENCOUNTER — Other Ambulatory Visit: Payer: Self-pay | Admitting: Primary Care

## 2020-10-15 ENCOUNTER — Other Ambulatory Visit: Payer: Self-pay | Admitting: Primary Care

## 2020-10-21 ENCOUNTER — Other Ambulatory Visit: Payer: Self-pay

## 2020-10-21 ENCOUNTER — Ambulatory Visit (INDEPENDENT_AMBULATORY_CARE_PROVIDER_SITE_OTHER): Payer: PPO | Admitting: Primary Care

## 2020-10-21 ENCOUNTER — Ambulatory Visit: Payer: PPO | Admitting: Primary Care

## 2020-10-21 ENCOUNTER — Encounter: Payer: Self-pay | Admitting: Primary Care

## 2020-10-21 DIAGNOSIS — J9611 Chronic respiratory failure with hypoxia: Secondary | ICD-10-CM | POA: Diagnosis not present

## 2020-10-21 DIAGNOSIS — J449 Chronic obstructive pulmonary disease, unspecified: Secondary | ICD-10-CM

## 2020-10-21 MED ORDER — PREDNISONE 20 MG PO TABS: ORAL_TABLET | ORAL | 1 refills | Status: AC

## 2020-10-21 MED ORDER — BREZTRI AEROSPHERE 160-9-4.8 MCG/ACT IN AERO: 2.0000 | INHALATION_SPRAY | Freq: Two times a day (BID) | RESPIRATORY_TRACT | 0 refills | Status: AC

## 2020-10-21 MED ORDER — AZITHROMYCIN 250 MG PO TABS: ORAL_TABLET | ORAL | 3 refills | Status: AC

## 2020-10-21 MED ORDER — PREDNISONE 10 MG PO TABS: ORAL_TABLET | ORAL | 0 refills | Status: AC

## 2020-10-21 NOTE — Assessment & Plan Note (Signed)
-   Patient re-qualified for oxygen in September 2021 - He needs 2L oxygen at rest and 3-4L on exertion to maintain O2 > 90%

## 2020-10-21 NOTE — Patient Instructions (Addendum)
-   Continue Breztri two puffs twice daily (rinse mouth after use)  - Use Albuterol rescue inhaler/ or nebulizer every 6 hours AS NEEDED   - Restart Azithromycin - take 1 tablet Monday, Wednesday, Friday (do not take more than prescribed and do not stop taking unless told to)  - Use flutter valve (or acapella device) 2-3 times a day   - We will send in a prednisone taper and then keep you on 20mg  prednisone daily   - Please discuss with PCP about increasing citalopram to 40mg  daily   Follow-up: - 6 weeks with Dr. 

## 2020-10-21 NOTE — Assessment & Plan Note (Addendum)
Patient is limited by his COPD symptoms, at baseline he has moderate-severe dyspnea with minimal exertion and chronic chest congestion. He has daily mucus production which has improved recently. He stopped taking Azithromycin MWF because he ran out of refills. He is compliant with Breztri twice daily. Taking mucinex twice daily, not currently using Flutter valve.  Plan: - Continue Breztri two puffs twice daily; prn albuterol hfa/neb q 6 hours - Resume Azithromycin 250mg  MWF - Prednisone taper as directed and then stay on 20mg  daily (may need PJP with Bactrim, will review with Dr. ) - Continue Mucinex 600mg  twice daily and advised he use flutter valve 2-3 times a day - Palliative care following  - Follow-up in 6 weeks with Dr. 

## 2020-10-21 NOTE — Progress Notes (Signed)
PCCM: Thanks for seeing him.  No need for PJP PPX Thanks Josephine Igo, DO [facility] 10/21/2020 6:01 PM

## 2020-10-21 NOTE — Progress Notes (Signed)
@Patient  ID: , male    DOB: 1955-12-15, 64 y.o.   MRN: [id number]  Chief Complaint  Patient presents with  . Follow-up    Reports palliative care is recommending increasing prednisone and medication for anxiety (Celebrex).    Referring provider: [id number], MD   Synopsis: Has stage IV COPD (steroid dependent) and history of a pulmonary embolism after a hospitalization in 2015 formerly followed by Dr. 2016.  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 PMH significant for COPD GOLD IV, chronic respiratory failure, allergic rhinitis, CAD, PE, cardiac arrhythmia, MI, multiple pulmonary nodules, pre-diabetes, left total hip replacement. Patient of Dr. 2014.   Symb/Spiriva changed to [facility] September 2021. Maintained on Albuterol hfa/nebulizer q6 hours, Singulair and prednisone 10mg  daily. Oxygen dependent 2-3L on exertion. Completed physical therapy some years ago.   Previous [facility] pulmonary encounter: 07/10/2020 Patient presents today for regular follow-up. Accompanied by his sister, patient lives alone. Needs to requlify for oxygen today. DME company is [employer]. He uses 3L oxygen continuously. He reports that his breathing is baseline but he becomes very out of breath with the slightest amount of exertion. He continues to have congested cough with thick mucus. He is compliant with Symbicort 160 + Spiriva Handihaler. He has never been tried on . He uses Albuterol nebulizer 3-4 times a day which does help. He takes mucinex daily. He is not currently using flutter valve as this has causes him to pass out in the past.    08/07/2020 Patient presents today for 4 week follow-up. Patient is extremely limited by his COPD symptoms. He has severe dyspnea with minimal exertion and congested cough. During last visit we started patient on Breztri. Continue 10mg  prednisone and albuterol hfa/nebulizer q 6 hours. Sent in new prescription for nebulizer machine. Consider to resume Azithromycin MWF. He has been referred to lung cancer screening program and has an apt on 08/05/20.   He is accompanied by male today. States that 08/09/2020 inhaler has been more convenient for him. Feels wheezing and congestion are better. He uses 3L oxygen at home, he re-qualified for oxygen during last visit. O2 today 93% RA at rest. Continues 10mg  prednisone. He is taking mucinex twice daily, not using flutter valve. He is not very active. LDCT 08/06/20 showed lung RADS 3. Discussed results, needs repeat CT in 6 months.   09/09/20- Dr. [employer] 64 yo PMH chronic bronchitis and COPD, on 2L [place]. Currently managed with Breztri, prn albuterol, prednisone 10mg  daily and azithromycin 250mg  MWF.  Patient had recent exacerbation treated with antibiotics and steroids after seeing 08/08/20 in clinic.  Longstanding history of COPD.  Currently on triple therapy inhaler regimen and chronic prednisone.  Still having daily sputum production.  Really been feeling weak for the past several days.  Increased cough sputum production low-grade fevers.  Denies hemoptysis  Prior pulmonary function test with stage IV COPD.  In 2018.  10/21/2020- Interim hx  Patient presents today for 6 week follow-up. During last visit he was treated with 14 day course of Augmentin and prednisone taper and told to stay on prednisone 10mg  daily.  Currently maintained on Breztri, 10mg  prednisone, azithromycin 250mg  MWF and prn albuterol.   He reports increased shortness of breath the last several days but found there was a kink in his oxygen tubing. He has daily sputum production but this has  improved a fair amount. He associated chest tightness and wheezing. He is taking mucinex daily but not using flutter valve regularly. Palliative care recommend increasing prednisone to 10mg  twice daily and celexa to 40mg  daily by PCP.    Imaging: 08/06/20 LDCT- Lungs/Pleura: Biapical pleural-parenchymal scarring, right greater than left.Moderate to severe centrilobular emphysematous changes, upper lung predominant.No focal consolidation.8.8 mm nodule in the medial right lung apex (image 55), similar to2018, although slightly more conspicuous and warranting follow-up   Allergies  Allergen Reactions  . Naproxen Sodium Itching    Immunization History  Administered Date(s) Administered  . Fluad Quad(high Dose 65+) 07/05/2019  . Influenza Split 08/10/2011, 07/27/2012  . Influenza,inj,Quad PF,6+ Mos 07/14/2013, 08/06/2014, 10/17/2015, 07/21/2016, 08/26/2017, 07/28/2018  . PFIZER SARS-COV-2 Vaccination 12/21/2019, 01/15/2020, 08/19/2020  . PPD Test 07/22/2017, 07/29/2017  . Pneumococcal Conjugate-13 08/06/2014  . Pneumococcal Polysaccharide-23 02/05/2012  . Tdap 09/03/2014, 07/14/2017    Past Medical History:  Diagnosis Date  . Anxiety   . Arthritis    "back" (01/24/2014)  . Asthma   . Chronic bronchitis (HCC)   . Cluster headache    "last bout was summer 2014; had them q spring 1991-2001" (01/24/2014)  . COPD (chronic obstructive pulmonary disease) (HCC)   . GERD (gastroesophageal reflux disease)   . NSTEMI (non-ST elevated myocardial infarction) (HCC) 01/2014  . On home oxygen therapy    "2L; 24/7" (01/24/2014)  . Pneumonia 06/2013; 01/2014  . Pulmonary embolism (HCC) 01/23/2014  . Seasonal allergies     Tobacco History: Social History   Tobacco Use  Smoking Status Former Smoker  . Packs/day: 2.00  . Years: 35.00  . Pack years: 70.00  . Types: Cigarettes  . Quit date: 01/24/2012  . Years since quitting: 8.7  Smokeless Tobacco Never Used  Tobacco Comment   Remain smoke free    Counseling given: Not Answered Comment: Remain smoke free   Outpatient Medications Prior to Visit  Medication Sig Dispense Refill  . albuterol (PROVENTIL) (2.5 MG/3ML) 0.083% nebulizer solution Take 3 mLs (2.5 mg total) by nebulization every 6 (six) hours as needed for wheezing or shortness of breath. 360 mL 5  . albuterol (VENTOLIN HFA) 108 (90 Base) MCG/ACT inhaler Inhale 2 puffs into the lungs every 6 (six) hours as needed for wheezing or shortness of breath. 8 g 11  . atorvastatin (LIPITOR) 10 MG tablet Take 1 tablet (10 mg total) by mouth daily. 90 tablet 3  . Budeson-Glycopyrrol-Formoterol (BREZTRI AEROSPHERE) 160-9-4.8 MCG/ACT AERO Inhale 2 puffs into the lungs 2 (two) times daily. 10.7 g 5  . citalopram (CELEXA) 20 MG tablet Take 1.5 tablets (30 mg total) by mouth daily. 135 tablet 3  . fluticasone (FLONASE) 50 MCG/ACT nasal spray Place 1 spray into both nostrils daily. 9.9 mL 0  . guaiFENesin (MUCINEX) 600 MG 12 hr tablet Take 2 tablets (1,200 mg total) by mouth 2 (two) times daily. 120 tablet 0  . hydrOXYzine (ATARAX/VISTARIL) 10 MG tablet Take 1 tablet (10 mg total) by mouth 3 (three) times daily as needed. 90 tablet 2  . montelukast (SINGULAIR) 10 MG tablet TAKE 1 TABLET(10 MG) BY MOUTH AT BEDTIME 30 tablet 11  . Multiple Vitamin (MULTI VITAMIN PO) Take 1 tablet by mouth daily.    07/2013  OXYGEN Inhale 2 L/min into the lungs as needed. For sats less than 90%    . sodium chloride (OCEAN) 0.65 % SOLN nasal spray Place 1 spray into both nostrils 4 (four) times daily as needed for congestion. 15 mL 0  . triamterene-hydrochlorothiazide (MAXZIDE-25) 37.5-25 MG tablet TAKE 1 TABLET BY MOUTH DAILY 90 tablet 2  . Vitamin D, Ergocalciferol, (DRISDOL) 1.25 MG (50000 UNIT) CAPS capsule Take 1 capsule (50,000 Units total) by mouth every 7 (seven) days. 12 capsule 0  . XARELTO 20 MG TABS tablet TAKE 1 TABLET(20 MG) BY MOUTH EVERY EVENING 30 tablet 3  . azithromycin (ZITHROMAX) 250 MG tablet Written  Order reads: Azithromycin 250 mg once daily on Monday, Wednesday, Friday 21 tablet 0  . doxycycline (VIBRA-TABS) 100 MG tablet Take 1 tablet (100 mg total) by mouth 2 (two) times daily. 14 tablet 0  . predniSONE (DELTASONE) 10 MG tablet Take 4 tabs by mouth once daily x4 days, then 3 tabs x4 days, 2 tabs x4 days, 1 tab x4 days and stop. 40 tablet 0  . predniSONE (DELTASONE) 10 MG tablet TAKE 1 TABLET(10 MG) BY MOUTH DAILY 30 tablet 2   No facility-administered medications prior to visit.   Review of Systems  Review of Systems  Constitutional: Negative.   Respiratory: Positive for cough, chest tightness, shortness of breath and wheezing.   Cardiovascular: Negative.   Psychiatric/Behavioral: Negative.    Physical Exam  BP 138/90   Pulse 84   Temp 97.9 F (36.6 C)   Ht 6\' 4"  (1.93 m)   Wt 205 lb (93 kg)   SpO2 100%   BMI 24.95 kg/m  Physical Exam Constitutional:      General: He is not in acute distress.    Appearance: Normal appearance.     Comments: Chronically ill appearing  HENT:     Head: Normocephalic and atraumatic.     Mouth/Throat:     Comments: Deferred d/t masking Cardiovascular:     Rate and Rhythm: Normal rate and regular rhythm.  Pulmonary:     Breath sounds: Wheezing and rales present.     Comments: Scattered wheezing and fine rales t/o Musculoskeletal:     Comments: Slow gait  Skin:    General: Skin is warm and dry.  Neurological:     General: No focal deficit present.     Mental Status: He is alert and oriented to person, place, and time. Mental status is at baseline.  Psychiatric:        Mood and Affect: Mood normal.        Behavior: Behavior normal.        Thought Content: Thought content normal.        Judgment: Judgment normal.      Lab Results:  CBC    Component Value Date/Time   WBC 12.1 (H) 06/03/2020 1713   RBC 4.39 06/03/2020 1713   HGB 13.6 06/03/2020 1713   HCT 39.9 06/03/2020 1713   PLT 398.0 06/03/2020 1713   MCV 90.9  06/03/2020 1713   MCH 31.2 09/14/2019 0513   MCHC 34.0 06/03/2020 1713   RDW 14.4 06/03/2020 1713   LYMPHSABS 1.0 06/03/2020 1713   MONOABS 0.8 06/03/2020 1713   EOSABS 0.1 06/03/2020 1713   BASOSABS 0.1 06/03/2020 1713    BMET    Component Value Date/Time   NA 130 (L) 06/03/2020 1713   NA 137 09/20/2019 0000   K 4.2 06/03/2020 1713   CL 93 (L) 06/03/2020 1713  CO2 26 06/03/2020 1713   GLUCOSE 114 (H) 06/03/2020 1713   BUN 11 06/03/2020 1713   BUN 20 09/20/2019 0000   CREATININE 0.85 06/03/2020 1713   CALCIUM 8.9 06/03/2020 1713   GFRNONAA 91 06/03/2020 1713   GFRAA 105 06/03/2020 1713    BNP    Component Value Date/Time   BNP 44.0 08/30/2019 2339    ProBNP    Component Value Date/Time   PROBNP 38.0 08/18/2018 1144    Imaging: No results found.   Assessment & Plan:   COPD GOLD IV  Patient is limited by his COPD symptoms, at baseline he has moderate-severe dyspnea with minimal exertion and chronic chest congestion. He has daily mucus production which has improved recently. He stopped taking Azithromycin MWF because he ran out of refills. He is compliant with Breztri twice daily. Taking mucinex twice daily, not currently using Flutter valve.  Plan: - Continue Breztri two puffs twice daily; prn albuterol hfa/neb q 6 hours - Resume Azithromycin 250mg  MWF - Prednisone taper as directed and then stay on 20mg  daily (may need PJP with Bactrim, will review with Dr. ) - Continue Mucinex 600mg  twice daily and advised he use flutter valve 2-3 times a day - Palliative care following  - Follow-up in 6 weeks with Dr.  Chronic respiratory failure with hypoxia [facility]) - Patient re-qualified for oxygen in September 2021 - He needs 2L oxygen at rest and 3-4L on exertion to maintain O2 > 90%    Tonia Brooms, NP 10/21/2020

## 2020-10-24 ENCOUNTER — Emergency Department (HOSPITAL_COMMUNITY): Payer: PPO

## 2020-10-24 ENCOUNTER — Inpatient Hospital Stay (HOSPITAL_COMMUNITY)
Admission: EM | Admit: 2020-10-24 | Discharge: 2020-11-09 | DRG: 189 | Disposition: E | Payer: PPO | Attending: Internal Medicine | Admitting: Internal Medicine

## 2020-10-24 ENCOUNTER — Encounter (HOSPITAL_COMMUNITY): Payer: Self-pay

## 2020-10-24 DIAGNOSIS — I2510 Atherosclerotic heart disease of native coronary artery without angina pectoris: Secondary | ICD-10-CM | POA: Diagnosis present

## 2020-10-24 DIAGNOSIS — S199XXA Unspecified injury of neck, initial encounter: Secondary | ICD-10-CM | POA: Diagnosis not present

## 2020-10-24 DIAGNOSIS — Z800 Family history of malignant neoplasm of digestive organs: Secondary | ICD-10-CM

## 2020-10-24 DIAGNOSIS — I2699 Other pulmonary embolism without acute cor pulmonale: Secondary | ICD-10-CM | POA: Diagnosis present

## 2020-10-24 DIAGNOSIS — I252 Old myocardial infarction: Secondary | ICD-10-CM | POA: Diagnosis not present

## 2020-10-24 DIAGNOSIS — M4312 Spondylolisthesis, cervical region: Secondary | ICD-10-CM | POA: Diagnosis not present

## 2020-10-24 DIAGNOSIS — R52 Pain, unspecified: Secondary | ICD-10-CM | POA: Diagnosis not present

## 2020-10-24 DIAGNOSIS — M546 Pain in thoracic spine: Secondary | ICD-10-CM | POA: Diagnosis present

## 2020-10-24 DIAGNOSIS — G319 Degenerative disease of nervous system, unspecified: Secondary | ICD-10-CM | POA: Diagnosis not present

## 2020-10-24 DIAGNOSIS — M879 Osteonecrosis, unspecified: Secondary | ICD-10-CM | POA: Diagnosis present

## 2020-10-24 DIAGNOSIS — J341 Cyst and mucocele of nose and nasal sinus: Secondary | ICD-10-CM | POA: Diagnosis not present

## 2020-10-24 DIAGNOSIS — Z515 Encounter for palliative care: Secondary | ICD-10-CM | POA: Diagnosis not present

## 2020-10-24 DIAGNOSIS — S32592A Other specified fracture of left pubis, initial encounter for closed fracture: Secondary | ICD-10-CM | POA: Diagnosis present

## 2020-10-24 DIAGNOSIS — J9621 Acute and chronic respiratory failure with hypoxia: Principal | ICD-10-CM | POA: Diagnosis present

## 2020-10-24 DIAGNOSIS — K219 Gastro-esophageal reflux disease without esophagitis: Secondary | ICD-10-CM | POA: Diagnosis present

## 2020-10-24 DIAGNOSIS — Z8249 Family history of ischemic heart disease and other diseases of the circulatory system: Secondary | ICD-10-CM

## 2020-10-24 DIAGNOSIS — M549 Dorsalgia, unspecified: Secondary | ICD-10-CM | POA: Diagnosis not present

## 2020-10-24 DIAGNOSIS — S0990XA Unspecified injury of head, initial encounter: Secondary | ICD-10-CM | POA: Diagnosis not present

## 2020-10-24 DIAGNOSIS — Z96642 Presence of left artificial hip joint: Secondary | ICD-10-CM | POA: Diagnosis not present

## 2020-10-24 DIAGNOSIS — W19XXXA Unspecified fall, initial encounter: Secondary | ICD-10-CM | POA: Diagnosis not present

## 2020-10-24 DIAGNOSIS — I6529 Occlusion and stenosis of unspecified carotid artery: Secondary | ICD-10-CM | POA: Diagnosis not present

## 2020-10-24 DIAGNOSIS — Z9981 Dependence on supplemental oxygen: Secondary | ICD-10-CM | POA: Diagnosis not present

## 2020-10-24 DIAGNOSIS — I10 Essential (primary) hypertension: Secondary | ICD-10-CM | POA: Diagnosis not present

## 2020-10-24 DIAGNOSIS — I480 Paroxysmal atrial fibrillation: Secondary | ICD-10-CM | POA: Diagnosis present

## 2020-10-24 DIAGNOSIS — M25552 Pain in left hip: Secondary | ICD-10-CM | POA: Diagnosis not present

## 2020-10-24 DIAGNOSIS — J439 Emphysema, unspecified: Secondary | ICD-10-CM | POA: Diagnosis not present

## 2020-10-24 DIAGNOSIS — E871 Hypo-osmolality and hyponatremia: Secondary | ICD-10-CM | POA: Diagnosis not present

## 2020-10-24 DIAGNOSIS — S329XXA Fracture of unspecified parts of lumbosacral spine and pelvis, initial encounter for closed fracture: Secondary | ICD-10-CM

## 2020-10-24 DIAGNOSIS — Z66 Do not resuscitate: Secondary | ICD-10-CM | POA: Diagnosis present

## 2020-10-24 DIAGNOSIS — S2239XA Fracture of one rib, unspecified side, initial encounter for closed fracture: Secondary | ICD-10-CM | POA: Diagnosis present

## 2020-10-24 DIAGNOSIS — R030 Elevated blood-pressure reading, without diagnosis of hypertension: Secondary | ICD-10-CM | POA: Diagnosis not present

## 2020-10-24 DIAGNOSIS — S32502A Unspecified fracture of left pubis, initial encounter for closed fracture: Secondary | ICD-10-CM | POA: Diagnosis not present

## 2020-10-24 DIAGNOSIS — Z806 Family history of leukemia: Secondary | ICD-10-CM

## 2020-10-24 DIAGNOSIS — R29890 Loss of height: Secondary | ICD-10-CM | POA: Diagnosis not present

## 2020-10-24 DIAGNOSIS — Z7952 Long term (current) use of systemic steroids: Secondary | ICD-10-CM

## 2020-10-24 DIAGNOSIS — W1830XA Fall on same level, unspecified, initial encounter: Secondary | ICD-10-CM | POA: Diagnosis present

## 2020-10-24 DIAGNOSIS — S2249XA Multiple fractures of ribs, unspecified side, initial encounter for closed fracture: Secondary | ICD-10-CM

## 2020-10-24 DIAGNOSIS — J449 Chronic obstructive pulmonary disease, unspecified: Secondary | ICD-10-CM | POA: Diagnosis not present

## 2020-10-24 DIAGNOSIS — S2241XA Multiple fractures of ribs, right side, initial encounter for closed fracture: Secondary | ICD-10-CM | POA: Diagnosis not present

## 2020-10-24 DIAGNOSIS — Z86711 Personal history of pulmonary embolism: Secondary | ICD-10-CM | POA: Diagnosis present

## 2020-10-24 DIAGNOSIS — J984 Other disorders of lung: Secondary | ICD-10-CM | POA: Diagnosis not present

## 2020-10-24 DIAGNOSIS — J441 Chronic obstructive pulmonary disease with (acute) exacerbation: Secondary | ICD-10-CM | POA: Diagnosis not present

## 2020-10-24 DIAGNOSIS — K5730 Diverticulosis of large intestine without perforation or abscess without bleeding: Secondary | ICD-10-CM | POA: Diagnosis not present

## 2020-10-24 DIAGNOSIS — Z79899 Other long term (current) drug therapy: Secondary | ICD-10-CM

## 2020-10-24 DIAGNOSIS — Z825 Family history of asthma and other chronic lower respiratory diseases: Secondary | ICD-10-CM

## 2020-10-24 DIAGNOSIS — S32302A Unspecified fracture of left ilium, initial encounter for closed fracture: Secondary | ICD-10-CM | POA: Diagnosis present

## 2020-10-24 DIAGNOSIS — Z20822 Contact with and (suspected) exposure to covid-19: Secondary | ICD-10-CM | POA: Diagnosis not present

## 2020-10-24 DIAGNOSIS — Z886 Allergy status to analgesic agent status: Secondary | ICD-10-CM

## 2020-10-24 DIAGNOSIS — G9389 Other specified disorders of brain: Secondary | ICD-10-CM | POA: Diagnosis not present

## 2020-10-24 DIAGNOSIS — F419 Anxiety disorder, unspecified: Secondary | ICD-10-CM | POA: Diagnosis present

## 2020-10-24 DIAGNOSIS — E872 Acidosis: Secondary | ICD-10-CM | POA: Diagnosis not present

## 2020-10-24 DIAGNOSIS — Z87891 Personal history of nicotine dependence: Secondary | ICD-10-CM

## 2020-10-24 DIAGNOSIS — I700 Atherosclerosis of aorta: Secondary | ICD-10-CM | POA: Diagnosis not present

## 2020-10-24 DIAGNOSIS — I959 Hypotension, unspecified: Secondary | ICD-10-CM | POA: Diagnosis present

## 2020-10-24 DIAGNOSIS — Z7901 Long term (current) use of anticoagulants: Secondary | ICD-10-CM

## 2020-10-24 LAB — CBC WITH DIFFERENTIAL/PLATELET
Abs Immature Granulocytes: 0.54 10*3/uL — ABNORMAL HIGH (ref 0.00–0.07)
Basophils Absolute: 0.1 10*3/uL (ref 0.0–0.1)
Basophils Relative: 0 %
Eosinophils Absolute: 0 10*3/uL (ref 0.0–0.5)
Eosinophils Relative: 0 %
HCT: 40.6 % (ref 39.0–52.0)
Hemoglobin: 13.7 g/dL (ref 13.0–17.0)
Immature Granulocytes: 3 %
Lymphocytes Relative: 3 %
Lymphs Abs: 0.4 10*3/uL — ABNORMAL LOW (ref 0.7–4.0)
MCH: 31.9 pg (ref 26.0–34.0)
MCHC: 33.7 g/dL (ref 30.0–36.0)
MCV: 94.4 fL (ref 80.0–100.0)
Monocytes Absolute: 0.6 10*3/uL (ref 0.1–1.0)
Monocytes Relative: 4 %
Neutro Abs: 14.6 10*3/uL — ABNORMAL HIGH (ref 1.7–7.7)
Neutrophils Relative %: 90 %
Platelets: 369 10*3/uL (ref 150–400)
RBC: 4.3 MIL/uL (ref 4.22–5.81)
RDW: 13.8 % (ref 11.5–15.5)
WBC: 16.2 10*3/uL — ABNORMAL HIGH (ref 4.0–10.5)
nRBC: 0.1 % (ref 0.0–0.2)

## 2020-10-24 LAB — COMPREHENSIVE METABOLIC PANEL
ALT: 26 U/L (ref 0–44)
AST: 30 U/L (ref 15–41)
Albumin: 3.9 g/dL (ref 3.5–5.0)
Alkaline Phosphatase: 60 U/L (ref 38–126)
Anion gap: 10 (ref 5–15)
BUN: 13 mg/dL (ref 8–23)
CO2: 27 mmol/L (ref 22–32)
Calcium: 8.9 mg/dL (ref 8.9–10.3)
Chloride: 91 mmol/L — ABNORMAL LOW (ref 98–111)
Creatinine, Ser: 0.88 mg/dL (ref 0.61–1.24)
GFR, Estimated: >60 mL/min (ref >60)
Glucose, Bld: 114 mg/dL — ABNORMAL HIGH (ref 70–99)
Potassium: 5.1 mmol/L (ref 3.5–5.1)
Sodium: 128 mmol/L — ABNORMAL LOW (ref 135–145)
Total Bilirubin: 1 mg/dL (ref 0.3–1.2)
Total Protein: 7.1 g/dL (ref 6.5–8.1)

## 2020-10-24 LAB — RESP PANEL BY RT-PCR (FLU A&B, COVID) ARPGX2
Influenza A by PCR: NEGATIVE
Influenza B by PCR: NEGATIVE
SARS Coronavirus 2 by RT PCR: NEGATIVE

## 2020-10-24 LAB — HEPARIN LEVEL (UNFRACTIONATED): Heparin Unfractionated: 1.68 IU/mL — ABNORMAL HIGH (ref 0.30–0.70)

## 2020-10-24 LAB — PROTIME-INR
INR: 1.2 (ref 0.8–1.2)
Prothrombin Time: 15 seconds (ref 11.4–15.2)

## 2020-10-24 LAB — APTT: aPTT: 29 seconds (ref 24–36)

## 2020-10-24 MED ORDER — BISACODYL 5 MG PO TBEC: 5.0000 mg | DELAYED_RELEASE_TABLET | Freq: Every day | ORAL | Status: DC | PRN

## 2020-10-24 MED ORDER — FLUTICASONE FUROATE-VILANTEROL 100-25 MCG/INH IN AEPB
1.0000 | INHALATION_SPRAY | Freq: Every day | RESPIRATORY_TRACT | Status: DC
  Filled 2020-10-24: qty 28

## 2020-10-24 MED ORDER — PREDNISONE 20 MG PO TABS
20.0000 mg | ORAL_TABLET | Freq: Every day | ORAL | Status: DC
Start: 2020-10-25 — End: 2020-10-25

## 2020-10-24 MED ORDER — ATORVASTATIN CALCIUM 10 MG PO TABS: 10.0000 mg | ORAL_TABLET | Freq: Every day | ORAL | Status: DC

## 2020-10-24 MED ORDER — ONDANSETRON HCL 4 MG/2ML IJ SOLN
4.0000 mg | Freq: Once | INTRAMUSCULAR | Status: AC
  Administered 2020-10-24: 15:00:00 4 mg via INTRAVENOUS
  Filled 2020-10-24: qty 2

## 2020-10-24 MED ORDER — ONDANSETRON HCL 4 MG PO TABS: 4.0000 mg | ORAL_TABLET | Freq: Four times a day (QID) | ORAL | Status: DC | PRN

## 2020-10-24 MED ORDER — BUDESON-GLYCOPYRROL-FORMOTEROL 160-9-4.8 MCG/ACT IN AERO: 2.0000 | INHALATION_SPRAY | Freq: Two times a day (BID) | RESPIRATORY_TRACT | Status: DC

## 2020-10-24 MED ORDER — HEPARIN (PORCINE) 25000 UT/250ML-% IV SOLN
1600.0000 [IU]/h | INTRAVENOUS | Status: DC
  Administered 2020-10-24: 23:00:00 1300 [IU]/h via INTRAVENOUS
  Filled 2020-10-24: qty 250

## 2020-10-24 MED ORDER — UMECLIDINIUM BROMIDE 62.5 MCG/INH IN AEPB
1.0000 | INHALATION_SPRAY | Freq: Every day | RESPIRATORY_TRACT | Status: DC
  Filled 2020-10-24: qty 7

## 2020-10-24 MED ORDER — ALBUTEROL SULFATE HFA 108 (90 BASE) MCG/ACT IN AERS
2.0000 | INHALATION_SPRAY | Freq: Once | RESPIRATORY_TRACT | Status: AC
  Administered 2020-10-24: 15:00:00 2 via RESPIRATORY_TRACT
  Filled 2020-10-24: qty 6.7

## 2020-10-24 MED ORDER — ALBUTEROL SULFATE (2.5 MG/3ML) 0.083% IN NEBU: 2.5000 mg | INHALATION_SOLUTION | Freq: Four times a day (QID) | RESPIRATORY_TRACT | Status: DC | PRN

## 2020-10-24 MED ORDER — ACETAMINOPHEN 650 MG RE SUPP: 650.0000 mg | Freq: Four times a day (QID) | RECTAL | Status: DC | PRN

## 2020-10-24 MED ORDER — IOHEXOL 300 MG/ML  SOLN
100.0000 mL | Freq: Once | INTRAMUSCULAR | Status: AC | PRN
  Administered 2020-10-24: 18:00:00 100 mL via INTRAVENOUS

## 2020-10-24 MED ORDER — MORPHINE SULFATE (PF) 4 MG/ML IV SOLN
4.0000 mg | Freq: Once | INTRAVENOUS | Status: AC
  Administered 2020-10-24: 17:00:00 4 mg via INTRAVENOUS
  Filled 2020-10-24: qty 1

## 2020-10-24 MED ORDER — CITALOPRAM HYDROBROMIDE 10 MG PO TABS: 20.0000 mg | ORAL_TABLET | Freq: Every day | ORAL | Status: DC

## 2020-10-24 MED ORDER — TRIAMTERENE-HCTZ 37.5-25 MG PO TABS: 1.0000 | ORAL_TABLET | Freq: Every day | ORAL | Status: DC

## 2020-10-24 MED ORDER — ADULT MULTIVITAMIN W/MINERALS CH: 1.0000 | ORAL_TABLET | Freq: Every day | ORAL | Status: DC

## 2020-10-24 MED ORDER — MORPHINE SULFATE (PF) 4 MG/ML IV SOLN
4.0000 mg | Freq: Once | INTRAVENOUS | Status: AC
  Administered 2020-10-24: 15:00:00 4 mg via INTRAVENOUS
  Filled 2020-10-24: qty 1

## 2020-10-24 MED ORDER — SALINE SPRAY 0.65 % NA SOLN
1.0000 | Freq: Four times a day (QID) | NASAL | Status: DC | PRN
  Filled 2020-10-24: qty 44

## 2020-10-24 MED ORDER — MULTI VITAMIN PO TABS: ORAL_TABLET | Freq: Every day | ORAL | Status: DC

## 2020-10-24 MED ORDER — AEROCHAMBER Z-STAT PLUS/MEDIUM MISC
1.0000 | Freq: Once | Status: AC
  Administered 2020-10-24: 15:00:00 1
  Filled 2020-10-24: qty 1

## 2020-10-24 MED ORDER — ACETAMINOPHEN 325 MG PO TABS: 650.0000 mg | ORAL_TABLET | Freq: Four times a day (QID) | ORAL | Status: DC | PRN

## 2020-10-24 MED ORDER — ONDANSETRON HCL 4 MG/2ML IJ SOLN: 4.0000 mg | Freq: Four times a day (QID) | INTRAMUSCULAR | Status: DC | PRN

## 2020-10-24 MED ORDER — FLUTICASONE PROPIONATE 50 MCG/ACT NA SUSP
1.0000 | Freq: Every day | NASAL | Status: DC
  Filled 2020-10-24: qty 16

## 2020-10-24 MED ORDER — OXYCODONE HCL 5 MG PO TABS
5.0000 mg | ORAL_TABLET | ORAL | Status: DC | PRN
  Administered 2020-10-24 – 2020-10-25 (×2): 5 mg via ORAL
  Filled 2020-10-24 (×2): qty 1

## 2020-10-24 MED ORDER — LACTATED RINGERS IV SOLN: INTRAVENOUS | Status: DC

## 2020-10-24 MED ORDER — MONTELUKAST SODIUM 10 MG PO TABS
10.0000 mg | ORAL_TABLET | Freq: Every day | ORAL | Status: DC
  Administered 2020-10-24: 22:00:00 10 mg via ORAL
  Filled 2020-10-24 (×2): qty 1

## 2020-10-24 MED ORDER — VITAMIN D 25 MCG (1000 UNIT) PO TABS
1000.0000 [IU] | ORAL_TABLET | Freq: Every day | ORAL | Status: DC
  Administered 2020-10-24: 22:00:00 1000 [IU] via ORAL
  Filled 2020-10-24: qty 1

## 2020-10-24 MED ORDER — IPRATROPIUM-ALBUTEROL 0.5-2.5 (3) MG/3ML IN SOLN
3.0000 mL | Freq: Four times a day (QID) | RESPIRATORY_TRACT | Status: DC | PRN
  Administered 2020-10-25: 04:00:00 3 mL via RESPIRATORY_TRACT
  Filled 2020-10-24: qty 3

## 2020-10-24 MED ORDER — IPRATROPIUM-ALBUTEROL 0.5-2.5 (3) MG/3ML IN SOLN
3.0000 mL | Freq: Once | RESPIRATORY_TRACT | Status: AC
  Administered 2020-10-24: 22:00:00 3 mL via RESPIRATORY_TRACT
  Filled 2020-10-24: qty 3

## 2020-10-24 MED ORDER — POLYETHYLENE GLYCOL 3350 17 G PO PACK: 17.0000 g | PACK | Freq: Every day | ORAL | Status: DC | PRN

## 2020-10-24 MED ORDER — GUAIFENESIN ER 600 MG PO TB12
1200.0000 mg | ORAL_TABLET | Freq: Two times a day (BID) | ORAL | Status: DC
  Administered 2020-10-24: 22:00:00 1200 mg via ORAL
  Filled 2020-10-24: qty 2

## 2020-10-24 MED ORDER — AZITHROMYCIN 250 MG PO TABS: 250.0000 mg | ORAL_TABLET | ORAL | Status: DC

## 2020-10-24 NOTE — ED Provider Notes (Signed)
Patient care assumed at 1700. Patient with history of COPD on 3 L oxygen at baseline here for evaluation following a fall. He has multiple rib fractures on plain films. Trauma scans are pending. Physical Exam  BP (!) 178/92 (BP Location: Right Arm)   Pulse (!) 130   Temp 98.1 F (36.7 C) (Oral)   Resp 20   SpO2 96%     ED Course/Procedures    CT scan with multiple rib fractures, the filial fracture. Discussed with Dr. Dwain Sarna with trauma service - recommends medicine admission to [facility] with trauma consult. Discussed with Dr. Roda Shutters, with orthopedics - will see the patient and consult. Hospitalist consulted for admission.  Procedures  MDM         Tilden Fossa, MD 2020-11-05 2016

## 2020-10-24 NOTE — ED Triage Notes (Signed)
BIB EMS from home. Fell backwards off scale at home. Lives alone. Denies head injury and LOC. C/o left hip pain and upper back pain

## 2020-10-24 NOTE — Progress Notes (Signed)
ANTICOAGULATION CONSULT NOTE  Pharmacy Consult for IV heparin Indication: Afib, recurrent DVT  Allergies  Allergen Reactions  . Naproxen Sodium Itching    Patient Measurements:   Heparin Dosing Weight: TBW  Vital Signs: Temp: 98.1 F (36.7 C) (12/16 1411) Temp Source: Oral (12/16 1411) BP: 149/96 (12/16 2012) Pulse Rate: 90 (12/16 2012)  Labs: Recent Labs    10/09/2020 1437  HGB 13.7  HCT 40.6  PLT 369  LABPROT 15.0  INR 1.2  CREATININE 0.88    Estimated Creatinine Clearance: 101.4 mL/min (by C-G formula based on SCr of 0.88 mg/dL).   Medical History: Past Medical History:  Diagnosis Date  . Anxiety   . Arthritis    "back" (01/24/2014)  . Asthma   . Chronic bronchitis (HCC)   . Cluster headache    "last bout was summer 2014; had them q spring 1991-2001" (01/24/2014)  . COPD (chronic obstructive pulmonary disease) (HCC)   . GERD (gastroesophageal reflux disease)   . NSTEMI (non-ST elevated myocardial infarction) (HCC) 01/2014  . On home oxygen therapy    "2L; 24/7" (01/24/2014)  . Pneumonia 06/2013; 01/2014  . Pulmonary embolism (HCC) 01/23/2014  . Seasonal allergies     Medications:  (Not in a hospital admission)  Scheduled:  . [START ON 18-Nov-2020] atorvastatin  10 mg Oral Daily  . [START ON November 18, 2020] azithromycin  250 mg Oral Q M,W,F  . cholecalciferol  1,000 Units Oral Daily  . [START ON 2020-11-18] citalopram  20 mg Oral Daily  . [START ON 11/18/2020] fluticasone  1 spray Each Nare Daily  . [START ON 18-Nov-2020] fluticasone furoate-vilanterol  1 puff Inhalation Daily  . guaiFENesin  1,200 mg Oral BID  . ipratropium-albuterol  3 mL Nebulization Once  . montelukast  10 mg Oral QHS  . [START ON November 18, 2020] multivitamin with minerals  1 tablet Oral Daily  . [START ON 18-Nov-2020] predniSONE  20 mg Oral Daily  . [START ON 11/18/20] umeclidinium bromide  1 puff Inhalation Daily   Infusions:  . heparin    . lactated ringers     Assessment: 87 yoM  with PMH COPD, Afib, PE 2015 & 2018 on Xarelto, admitted after fall at home   Baseline INR, aPTT: pending  Prior anticoagulation: Xarelto 20 mg daily; LD 12/15 at 2230  Significant events:  Today, 10/23/2020:  CBC: WNL  SCr WNL & at baseline  No bleeding or infusion issues per nursing  Goal of Therapy: Heparin level 0.3-0.7 units/ml Monitor platelets by anticoagulation protocol: Yes  Plan:  Heparin 1300 units/hr IV infusion  Check heparin level 8 hrs after start  Daily CBC, daily heparin level once stable  Monitor for signs of bleeding or thrombosis  Bernadene Person, PharmD, BCPS [phone] 10/23/2020, 9:57 PM

## 2020-10-24 NOTE — ED Provider Notes (Signed)
[facility]-EMERGENCY DEPT Provider Note   CSN: [id number] Arrival date & time: 11/01/2020  1404     History Chief Complaint  Patient presents with  . Fall    Theodore [name] is a 64 y.o. male.  Pt presents to the ED today with a fall.  He was weighing himself on his scale and lost his balance when he was getting off the scale.  He fell backwards and hit his left hip and right rib.  He is on Xarelto for afib, but denies hitting his head.  He denies loc.        Past Medical History:  Diagnosis Date  . Anxiety   . Arthritis    "back" (01/24/2014)  . Asthma   . Chronic bronchitis (HCC)   . Cluster headache    "last bout was summer 2014; had them q spring 1991-2001" (01/24/2014)  . COPD (chronic obstructive pulmonary disease) (HCC)   . GERD (gastroesophageal reflux disease)   . NSTEMI (non-ST elevated myocardial infarction) (HCC) 01/2014  . On home oxygen therapy    "2L; 24/7" (01/24/2014)  . Pneumonia 06/2013; 01/2014  . Pulmonary embolism (HCC) 01/23/2014  . Seasonal allergies     Patient Active Problem List   Diagnosis Date Noted  . CAP (community acquired pneumonia) 10/17/2019  . Acute blood loss as cause of postoperative anemia 09/23/2019  . Paroxysmal atrial fibrillation (HCC) 09/23/2019  . History of pulmonary embolism 09/23/2019  . Pressure injury of skin 09/05/2019  . Pneumoperitoneum 09/04/2019  . Current chronic use of systemic steroids 09/04/2019  . COPD with acute exacerbation (HCC) 08/31/2019  . Hyponatremia 08/31/2019  . Acute on chronic respiratory failure with hypoxia (HCC)   . Depression 08/22/2018  . Pre-diabetes 08/22/2018  . Trochanteric bursitis, left hip 11/12/2017  . Allergic rhinitis 10/11/2017  . Constipation due to opioid therapy 08/06/2017  . Recurrent pulmonary emboli (HCC) 07/26/2017  . History of MI (myocardial infarction) 07/26/2017  . History of total hip replacement, left 07/26/2017  . Chronic respiratory failure  with hypoxia (HCC) 07/16/2017  . Left displaced femoral neck fracture (HCC) 07/14/2017  . Closed displaced fracture of left femoral neck (HCC) 07/14/2017  . Hypokalemia   . Cardiac arrhythmia 07/06/2017  . Costochondral chest pain 12/28/2016  . Bilateral lower extremity edema 12/28/2016  . Chronic anticoagulation 12/23/2016  . DOE (dyspnea on exertion) 12/03/2016  . Chest pain 12/03/2016  . Anxiety state 10/30/2015  . Encounter for well adult exam with abnormal findings 09/03/2014  . Back pain 09/03/2014  . CAD (coronary artery disease) 01/23/2014  . Chronic respiratory failure assoc with cor pulmonale 01/20/2014  . Acute cor pulmonale (HCC) 01/12/2014  . Multiple lung nodules 10/24/2013  . Former smoker 03/03/2012  . COPD GOLD IV  09/28/2011    Past Surgical History:  Procedure Laterality Date  . COLONOSCOPY WITH PROPOFOL N/A 10/20/2016   Procedure: COLONOSCOPY WITH PROPOFOL;  Surgeon: Sherrilyn Rist, MD;  Location: [facility];  Service: Gastroenterology;  Laterality: N/A;  . LAPAROTOMY N/A 09/04/2019   Procedure: EXPLORATORY LAPAROTOMY;  Surgeon: Ovidio Kin, MD;  Location: [facility];  Service: General;  Laterality: N/A;  . NASAL SEPTOPLASTY W/ TURBINOPLASTY Left 1983  . TOTAL HIP ARTHROPLASTY Left 07/16/2017   Procedure: TOTAL HIP ARTHROPLASTY ANTERIOR APPROACH;  Surgeon: Tarry Kos, MD;  Location: [facility];  Service: Orthopedics;  Laterality: Left;  . TURBINATE REDUCTION Bilateral ~ 1983       Family History  Problem Relation Age  of Onset  . Leukemia Father   . Heart disease Father   . Asthma Father   . Asthma Sister   . Emphysema Sister   . Heart disease Sister   . High blood pressure Mother   . Colon cancer Cousin        First cousin    Social History   Tobacco Use  . Smoking status: Former Smoker    Packs/day: 2.00    Years: 35.00    Pack years: 70.00    Types: Cigarettes    Quit date: 01/24/2012    Years since quitting: 8.7  . Smokeless tobacco:  Never Used  . Tobacco comment: Remain smoke free  Vaping Use  . Vaping Use: Never used  Substance Use Topics  . Alcohol use: Not Currently  . Drug use: No    Home Medications Prior to Admission medications   Medication Sig Start Date End Date Taking? Authorizing Provider  albuterol (PROVENTIL) (2.5 MG/3ML) 0.083% nebulizer solution Take 3 mLs (2.5 mg total) by nebulization every 6 (six) hours as needed for wheezing or shortness of breath. 01/09/20   Icard, Rachel BoBradley L, DO  albuterol (VENTOLIN HFA) 108 (90 Base) MCG/ACT inhaler Inhale 2 puffs into the lungs every 6 (six) hours as needed for wheezing or shortness of breath. 11/09/19   Icard, Rachel BoBradley L, DO  atorvastatin (LIPITOR) 10 MG tablet Take 1 tablet (10 mg total) by mouth daily. 06/05/20   [name], Kieron W, MD  azithromycin [facility](ZITHROMAX) 250 MG tablet Written Order reads: Azithromycin 250 mg once daily on Monday, Wednesday, Friday 10/21/20   Glenford BayleyWalsh, Elizabeth W, NP  Budeson-Glycopyrrol-Formoterol (BREZTRI AEROSPHERE) 160-9-4.8 MCG/ACT AERO Inhale 2 puffs into the lungs 2 (two) times daily. 08/08/20   Icard, Rachel BoBradley L, DO  Budeson-Glycopyrrol-Formoterol (BREZTRI AEROSPHERE) 160-9-4.8 MCG/ACT AERO Inhale 2 puffs into the lungs in the morning and at bedtime. 10/21/20   Glenford BayleyWalsh, Elizabeth W, NP  citalopram (CELEXA) 20 MG tablet Take 1.5 tablets (30 mg total) by mouth daily. 10/11/20   [name], Mahamed W, MD  fluticasone (FLONASE) 50 MCG/ACT nasal spray Place 1 spray into both nostrils daily. 11/09/19   Icard, Rachel BoBradley L, DO  guaiFENesin (MUCINEX) 600 MG 12 hr tablet Take 2 tablets (1,200 mg total) by mouth 2 (two) times daily. 10/09/19   Margit HanksAlexander, Anne D, MD  hydrOXYzine (ATARAX/VISTARIL) 10 MG tablet Take 1 tablet (10 mg total) by mouth 3 (three) times daily as needed. 05/07/20   [name], Carsyn W, MD  montelukast (SINGULAIR) 10 MG tablet TAKE 1 TABLET(10 MG) BY MOUTH AT BEDTIME 12/12/19   Icard, Rachel BoBradley L, DO  Multiple Vitamin (MULTI VITAMIN PO) Take 1 tablet by mouth  daily.    [provider]  OXYGEN Inhale 2 L/min into the lungs as needed. For sats less than 90%    [provider]  predniSONE (DELTASONE) 10 MG tablet Take 4 tabs po daily x 3 days; then 3 tabs daily x3 days; then resume daily prednisone 10/21/20   Glenford BayleyWalsh, Elizabeth W, NP  predniSONE (DELTASONE) 20 MG tablet Take 1 tablet daily starting 10/28/20 10/21/20   Glenford BayleyWalsh, Elizabeth W, NP  sodium chloride (OCEAN) 0.65 % SOLN nasal spray Place 1 spray into both nostrils 4 (four) times daily as needed for congestion. 11/09/19   Josephine IgoIcard, Bradley L, DO  triamterene-hydrochlorothiazide (MAXZIDE-25) 37.5-25 MG tablet TAKE 1 TABLET BY MOUTH DAILY 05/10/20   [name], Rune W, MD  Vitamin D, Ergocalciferol, (DRISDOL) 1.25 MG (50000 UNIT) CAPS capsule Take 1 capsule (50,000  Units total) by mouth every 7 (seven) days. 06/05/20   [name], MD  XARELTO 20 MG TABS tablet TAKE 1 TABLET(20 MG) BY MOUTH EVERY EVENING 10/14/20   Glenford Bayley, NP    Allergies    Naproxen sodium  Review of Systems   Review of Systems  Respiratory: Positive for shortness of breath.   Musculoskeletal:       Right rib pain Left hip pain  All other systems reviewed and are negative.   Physical Exam Updated Vital Signs BP (!) 178/92 (BP Location: Right Arm)   Pulse (!) 130   Temp 98.1 F (36.7 C) (Oral)   Resp 20   SpO2 96%   Physical Exam Vitals and nursing note reviewed.  Constitutional:      Appearance: Normal appearance.  HENT:     Head: Normocephalic and atraumatic.     Right Ear: External ear normal.     Left Ear: External ear normal.     Nose: Nose normal.     Mouth/Throat:     Mouth: Mucous membranes are moist.     Pharynx: Oropharynx is clear.  Eyes:     Extraocular Movements: Extraocular movements intact.     Conjunctiva/sclera: Conjunctivae normal.     Pupils: Pupils are equal, round, and reactive to light.  Cardiovascular:     Rate and Rhythm: Normal rate and regular rhythm.      Pulses: Normal pulses.     Heart sounds: Normal heart sounds.  Pulmonary:     Effort: Tachypnea present.     Breath sounds: Wheezing present.  Abdominal:     General: Abdomen is flat. Bowel sounds are normal.     Palpations: Abdomen is soft.  Musculoskeletal:     Cervical back: Normal range of motion and neck supple.       Back:       Legs:  Skin:    General: Skin is warm and dry.     Capillary Refill: Capillary refill takes less than 2 seconds.  Neurological:     General: No focal deficit present.     Mental Status: He is alert and oriented to person, place, and time.     ED Results / Procedures / Treatments   Labs (all labs ordered are listed, but only abnormal results are displayed) Labs Reviewed  COMPREHENSIVE METABOLIC PANEL - Abnormal; Notable for the following components:      Result Value   Sodium 128 (*)    Chloride 91 (*)    Glucose, Bld 114 (*)    All other components within normal limits  CBC WITH DIFFERENTIAL/PLATELET - Abnormal; Notable for the following components:   WBC 16.2 (*)    Neutro Abs 14.6 (*)    Lymphs Abs 0.4 (*)    Abs Immature Granulocytes 0.54 (*)    All other components within normal limits  RESP PANEL BY RT-PCR (FLU A&B, COVID) ARPGX2  PROTIME-INR  URINALYSIS, ROUTINE W REFLEX MICROSCOPIC    EKG None  Radiology DG Ribs Unilateral W/Chest Right  Result Date: 10/31/2020 CLINICAL DATA:  Recent fall with right-sided chest pain, initial encounter EXAM: RIGHT RIBS AND CHEST - 3+ VIEW COMPARISON:  09/11/2019 FINDINGS: Cardiac shadow is within normal limits. The lungs are well aerated bilaterally. Posterior rib fracture is noted involving the right seventh rib. Undisplaced fractures involving the right eighth and ninth ribs are also seen posteriorly. No pneumothorax is noted. No other focal abnormality is seen. IMPRESSION: Fractures involving the right  seventh through ninth ribs. Electronically Signed   By: Alcide Clever M.D.   On: 10/22/2020  16:09   DG Hip Unilat W or Wo Pelvis 2-3 Views Left  Result Date: 10/31/2020 CLINICAL DATA:  Fall off scale at home. Left hip pain. EXAM: DG HIP (WITH OR WITHOUT PELVIS) 2-3V LEFT COMPARISON:  11/12/2017 FINDINGS: Left hip arthroplasty in expected alignment. There is no periprosthetic lucency or fracture. Femoral stem is midline. Pubic rami are intact. Pubic symphysis and sacroiliac joints are congruent. Serpiginous subchondral sclerosis involving the right femoral head may represent avascular necrosis or large subchondral cysts. This is stable from prior exam. IMPRESSION: 1. No acute fracture of the pelvis or left hip. 2. Left hip arthroplasty without complication. 3. 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 Electronically Signed   By: Narda Rutherford M.D.   On: 10/30/2020 16:07    Procedures Procedures (including critical care time)  Medications Ordered in ED Medications  morphine 4 MG/ML injection 4 mg (4 mg Intravenous Given 10/30/2020 1457)  ondansetron (ZOFRAN) injection 4 mg (4 mg Intravenous Given 11/05/2020 1457)  albuterol (VENTOLIN HFA) 108 (90 Base) MCG/ACT inhaler 2 puff (2 puffs Inhalation Given 11/07/2020 1501)  aerochamber Z-Stat Plus/medium 1 each (1 each Other Given 11/07/2020 1501)  morphine 4 MG/ML injection 4 mg (4 mg Intravenous Given 10/18/2020 1653)    ED Course  I have reviewed the triage vital signs and the nursing notes.  Pertinent labs & imaging results that were available during my care of the patient were reviewed by me and considered in my medical decision making (see chart for details).    MDM Rules/Calculators/A&P                          Pt d/w Dr. Donell Beers.  She requests a medicine admission due to the severe COPD.  She also requested more CT scans for a full trauma eval.  These have been ordered.    CHA2DS2/VAS Stroke Risk Points  Current as of 13 minutes ago     6 >= 2 Points: High Risk  1 - 1.99 Points: Medium Risk  0  Points: Low Risk    Last Change: N/A      Details    This score determines the patient's risk of having a stroke if the  patient has atrial fibrillation.       Points Metrics  0 Has Congestive Heart Failure:  No    Current as of 13 minutes ago  1 Has Vascular Disease:  Yes    Current as of 13 minutes ago  1 Has Hypertension:  Yes    Current as of 13 minutes ago  1 Age:  65    Current as of 13 minutes ago  1 Has Diabetes:  Yes    Current as of 13 minutes ago  2 Had Stroke:  No  Had TIA:  No  Had Thromboembolism:  Yes    Current as of 13 minutes ago  0 Male:  No    Current as of 13 minutes ago     Pt signed out to Dr. Madilyn Hook at shift change.     Final Clinical Impression(s) / ED Diagnoses Final diagnoses:  Fall, initial encounter  Closed fracture of multiple ribs of right side, initial encounter  Chronic obstructive pulmonary disease, unspecified COPD type (HCC)    Rx / DC Orders ED Discharge Orders    None  Jacalyn Lefevre, MD 10/23/2020 [phone]

## 2020-10-24 NOTE — Consult Note (Signed)
Reason for Consult: Fall with rib fractures Referring Physician: Kimber Esterly is an 64 y.o. male.  HPI:  Pt is a 64 yo M with COPD on 3L home o2 who sustained a ground level fall when he lost his balance on the scales at home.  He struck his right side very hard.  He did not lose consciousness.  He denies n/v.  His breathing is definitely worse than baseline. He is on Xarelto for PE x 2.  (2015 and 2018).  Second PE occurred post d/c of xarelto.  He is followed by Dr. Tonia Brooms for his COPD and small lung nodules.  He also complains of back pain and "right shoulder blade pain."  Past Medical History:  Diagnosis Date  . Anxiety   . Arthritis    "back" (01/24/2014)  . Asthma   . Chronic bronchitis (HCC)   . Cluster headache    "last bout was summer 2014; had them q spring 1991-2001" (01/24/2014)  . COPD (chronic obstructive pulmonary disease) (HCC)   . GERD (gastroesophageal reflux disease)   . NSTEMI (non-ST elevated myocardial infarction) (HCC) 01/2014  . On home oxygen therapy    "2L; 24/7" (01/24/2014)  . Pneumonia 06/2013; 01/2014  . Pulmonary embolism (HCC) 01/23/2014  . Seasonal allergies     Past Surgical History:  Procedure Laterality Date  . COLONOSCOPY WITH PROPOFOL N/A 10/20/2016   Procedure: COLONOSCOPY WITH PROPOFOL;  Surgeon: Sherrilyn Rist, MD;  Location: [facility];  Service: Gastroenterology;  Laterality: N/A;  . LAPAROTOMY N/A 09/04/2019   Procedure: EXPLORATORY LAPAROTOMY;  Surgeon: Ovidio Kin, MD;  Location: [facility];  Service: General;  Laterality: N/A;  . NASAL SEPTOPLASTY W/ TURBINOPLASTY Left 1983  . TOTAL HIP ARTHROPLASTY Left 07/16/2017   Procedure: TOTAL HIP ARTHROPLASTY ANTERIOR APPROACH;  Surgeon: Tarry Kos, MD;  Location: [facility];  Service: Orthopedics;  Laterality: Left;  . TURBINATE REDUCTION Bilateral ~ 1983    Family History  Problem Relation Age of Onset  . Leukemia Father   . Heart disease Father   . Asthma Father   . Asthma  Sister   . Emphysema Sister   . Heart disease Sister   . High blood pressure Mother   . Colon cancer Cousin        First cousin    Social History:  reports that he quit smoking about 8 years ago. His smoking use included cigarettes. He has a 70.00 pack-year smoking history. He has never used smokeless tobacco. He reports previous alcohol use. He reports that he does not use drugs.  Allergies:  Allergies  Allergen Reactions  . Naproxen Sodium Itching    Medications:  albuterol (PROVENTIL) (2.5 MG/3ML) 0.083% nebulizer solution  albuterol (VENTOLIN HFA) 108 (90 Base) MCG/ACT inhaler  atorvastatin (LIPITOR) 10 MG tablet  azithromycin (ZITHROMAX) 250 MG tablet  Budeson-Glycopyrrol-Formoterol (BREZTRI AEROSPHERE) 160-9-4.8 MCG/ACT AERO  Budeson-Glycopyrrol-Formoterol (BREZTRI AEROSPHERE) 160-9-4.8 MCG/ACT AERO  citalopram (CELEXA) 20 MG tablet  fluticasone (FLONASE) 50 MCG/ACT nasal spray  guaiFENesin (MUCINEX) 600 MG 12 hr tablet  hydrOXYzine (ATARAX/VISTARIL) 10 MG tablet  montelukast (SINGULAIR) 10 MG tablet  Multiple Vitamin (MULTI VITAMIN PO)  OXYGEN  predniSONE (DELTASONE) 10 MG tablet  predniSONE (DELTASONE) 20 MG tablet  sodium chloride (OCEAN) 0.65 % SOLN nasal spray  triamterene-hydrochlorothiazide (MAXZIDE-25) 37.5-25 MG tablet  Vitamin D, Ergocalciferol, (DRISDOL) 1.25 MG (50000 UNIT) CAPS capsule  XARELTO 20 MG TABS tablet  Results for orders placed or performed during the hospital encounter of  11/02/2020 (from the past 48 hour(s))  Comprehensive metabolic panel     Status: Abnormal   Collection Time: 11/02/2020  2:37 PM  Result Value Ref Range   Sodium 128 (L) 135 - 145 mmol/L   Potassium 5.1 3.5 - 5.1 mmol/L   Chloride 91 (L) 98 - 111 mmol/L   CO2 27 22 - 32 mmol/L   Glucose, Bld 114 (H) 70 - 99 mg/dL    Comment: Glucose reference range applies only to samples taken after fasting for at least 8 hours.   BUN 13 8 - 23 mg/dL   Creatinine, Ser  5.80 0.61 - 1.24 mg/dL   Calcium 8.9 8.9 - 99.8 mg/dL   Total Protein 7.1 6.5 - 8.1 g/dL   Albumin 3.9 3.5 - 5.0 g/dL   AST 30 15 - 41 U/L   ALT 26 0 - 44 U/L   Alkaline Phosphatase 60 38 - 126 U/L   Total Bilirubin 1.0 0.3 - 1.2 mg/dL   GFR, Estimated >33 >82 mL/min    Comment: (NOTE) Calculated using the CKD-EPI Creatinine Equation (2021)    Anion gap 10 5 - 15    Comment: Performed at [facility], [address], [place], [place] [address]  CBC with Differential     Status: Abnormal   Collection Time: 10/18/2020  2:37 PM  Result Value Ref Range   WBC 16.2 (H) 4.0 - 10.5 K/uL   RBC 4.30 4.22 - 5.81 MIL/uL   Hemoglobin 13.7 13.0 - 17.0 g/dL   HCT 76.7 34.1 - 93.7 %   MCV 94.4 80.0 - 100.0 fL   MCH 31.9 26.0 - 34.0 pg   MCHC 33.7 30.0 - 36.0 g/dL   RDW 90.2 40.9 - 73.5 %   Platelets 369 150 - 400 K/uL   nRBC 0.1 0.0 - 0.2 %   Neutrophils Relative % 90 %   Neutro Abs 14.6 (H) 1.7 - 7.7 K/uL   Lymphocytes Relative 3 %   Lymphs Abs 0.4 (L) 0.7 - 4.0 K/uL   Monocytes Relative 4 %   Monocytes Absolute 0.6 0.1 - 1.0 K/uL   Eosinophils Relative 0 %   Eosinophils Absolute 0.0 0.0 - 0.5 K/uL   Basophils Relative 0 %   Basophils Absolute 0.1 0.0 - 0.1 K/uL   Immature Granulocytes 3 %   Abs Immature Granulocytes 0.54 (H) 0.00 - 0.07 K/uL    Comment: Performed at [facility], [address] [place], [place] [address]  Protime-INR     Status: None   Collection Time: 11/05/2020  2:37 PM  Result Value Ref Range   Prothrombin Time 15.0 11.4 - 15.2 seconds   INR 1.2 0.8 - 1.2    Comment: (NOTE) INR goal varies based on device and disease states. Performed at [facility], [address] [place], [place] [address]     DG Ribs Unilateral W/Chest Right  Result Date: 10/11/2020 CLINICAL DATA:  Recent fall with right-sided chest pain, initial encounter EXAM: RIGHT RIBS AND CHEST - 3+ VIEW COMPARISON:  09/11/2019 FINDINGS:  Cardiac shadow is within normal limits. The lungs are well aerated bilaterally. Posterior rib fracture is noted involving the right seventh rib. Undisplaced fractures involving the right eighth and ninth ribs are also seen posteriorly. No pneumothorax is noted. No other focal abnormality is seen. IMPRESSION: Fractures involving the right seventh through ninth ribs. Electronically Signed   By: Alcide Clever M.D.   On: 10/14/2020 16:09  DG Hip Unilat W or Wo Pelvis 2-3 Views Left  Result Date: Nov 03, 2020 CLINICAL DATA:  Fall off scale at home. Left hip pain. EXAM: DG HIP (WITH OR WITHOUT PELVIS) 2-3V LEFT COMPARISON:  11/12/2017 FINDINGS: Left hip arthroplasty in expected alignment. There is no periprosthetic lucency or fracture. Femoral stem is midline. Pubic rami are intact. Pubic symphysis and sacroiliac joints are congruent. Serpiginous subchondral sclerosis involving the right femoral head may represent avascular necrosis or large subchondral cysts. This is stable from prior exam. IMPRESSION: 1. No acute fracture of the pelvis or left hip. 2. Left hip arthroplasty without complication. 3. 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 Electronically Signed   By: Narda Rutherford M.D.   On: 2020/11/03 16:07    Review of Systems  Constitutional: Negative.   HENT: Negative.   Eyes: Negative.   Respiratory: Positive for chest tightness, shortness of breath and wheezing.   Cardiovascular: Positive for chest pain.  Gastrointestinal: Negative.   Endocrine: Negative.   Genitourinary: Negative.   Musculoskeletal: Positive for back pain.  Skin: Negative.   Allergic/Immunologic: Negative.   Neurological: Negative.   Hematological: Bruises/bleeds easily.  Psychiatric/Behavioral: Negative.    Blood pressure (!) 178/92, pulse (!) 130, temperature 98.1 F (36.7 C), temperature source Oral, resp. rate 20, SpO2 96 %. Physical Exam Vitals reviewed.  Constitutional:       General: He is in acute distress.     Appearance: Normal appearance.  HENT:     Head: Normocephalic and atraumatic.     Right Ear: External ear normal.     Left Ear: External ear normal.     Mouth/Throat:     Mouth: Mucous membranes are moist.  Eyes:     General: No scleral icterus.       Right eye: No discharge.        Left eye: No discharge.     Extraocular Movements: Extraocular movements intact.     Conjunctiva/sclera: Conjunctivae normal.     Pupils: Pupils are equal, round, and reactive to light.  Cardiovascular:     Rate and Rhythm: Regular rhythm. Tachycardia present.     Pulses: Normal pulses.     Heart sounds: Normal heart sounds.     Comments: Tr pitting edema distally in pre tibial region Pulmonary:     Effort: Respiratory distress present.     Breath sounds: Wheezing and rhonchi present.  Chest:     Chest wall: Tenderness present.  Abdominal:     Palpations: There is no mass.     Tenderness: There is no abdominal tenderness. There is no guarding or rebound.     Hernia: No hernia is present.     Comments: Protuberant with midline scar No hepatosplenomegaly  Musculoskeletal:        General: No swelling, tenderness, deformity or signs of injury.     Cervical back: Normal range of motion and neck supple. No rigidity or tenderness.  Skin:    General: Skin is warm and dry.     Capillary Refill: Capillary refill takes 2 to 3 seconds.     Coloration: Skin is pale. Skin is not jaundiced.     Findings: No bruising, erythema, lesion or rash.  Neurological:     General: No focal deficit present.     Mental Status: He is alert and oriented to person, place, and time.     Cranial Nerves: No cranial nerve deficit.     Sensory: No sensory deficit.  Coordination: Coordination normal.  Psychiatric:        Mood and Affect: Mood normal.        Behavior: Behavior normal.        Thought Content: Thought content normal.        Judgment: Judgment normal.      Assessment/Plan:   Ground level fall Rib fractures right 8-9 Anticoagulation Severe COPD Home oxygen dependence hyponatremia  Recommended completion of trauma workup with scans given anticoagulation and rib fractures.   Trauma will consult on [facility] at [facility].  Pain control Pulmonary toilet Pt at very high risk for pneumonia.   I stressed to the patient the importance of pulmonary toilet with cough and deep breathing.    Almond LintFaera Fernado Brigante 2020/01/02, 5:52 PM

## 2020-10-24 NOTE — ED Notes (Signed)
Sister, Kristine Garbe, [phone] will give him a ride home when discharged.

## 2020-10-24 NOTE — Progress Notes (Signed)
I discussed with EDP about patient's orthopedic injuries.  I have reviewed imaging studies of the pelvic fractures.  They appear to be stable and may be WBAT to BLE.  He can mobilize with PT as symptoms allow.  Follow up in the office in about 2 weeks for repeat xrays and evaluation.  Theodore Reel, MD [facility] [phone] 8:16 PM

## 2020-10-24 NOTE — H&P (Signed)
History and Physical    Theodore GuestJames W [name] ZOX:[id number] DOB: Aug 24, 1956 DOA: 03-Nov-2020  Referring MD/NP/PA: ED PCP: [name], [name], MD  Outpatient Specialists: [facility] Dr. Tonia BroomsIcard  Patient coming from: Home  Chief Complaint: Mechanical Fall   HPI: Theodore [name] is a 64 y.o. male with medical history significant for COPD on home 3L , CAD, pAF, history of recurrent pulmonary embolisms who presents to [facility] after ground-level fall.  Patient states he was at home and was stepping off of bathroom scale when he fell and hit the heater on the wall and fell on the floor. He denies any head trauma.  He denies any syncope or presyncope.  He denies having chest pain or shortness of breath outside his baseline chronic COPD. States he just fell. He did request a breathing treatment while I was interviewing him.  Denies nausea, vomiting, chills, fever.  He does take Xarelto for A. fib and recurrent pulmonary embolism.   He has recently seen palliative care and decided to be DNR.   ED Course:  He was initially afebrile, tachycardic to low 100s, respiratory rate 20, BP elevated at 160/86 saturating at 96% on 3 L.  Abs notable for sodium 128 and a WBC of 16.2 with left shift.  His Covid was negative.  He underwent CT head, C-spine, and chest abdomen pelvis.  CT had with no acute intracranial abnormalities.  CT spine weight loss of height C7 which is age-indeterminate but no edema suggesting it is chronic. CT chest shows  displaced left inferior pubic rami fracture.. Nondisplaced left ilium fracture that extends to the sacroiliac joint. Acute displaced fracture of the right 7-9 ribs in a patient with healed posterior 8th rib fracture. No acute traumatic injury to the chest, abdomen, or pelvis with limited evaluation of the urinary bladder due to streak artifact originating from the left femoral hardware. No acute fracture or traumatic malalignment of the thoracic or lumbar spine.   Other  imaging findings of potential clinical significance: Nonobstructive 4 mm in left nephrolithiasis. Sigmoid diverticulosis. Right femoral head avascular necrosis. Total left hip arthroplasty. Aortic Atherosclerosis and Emphysema.  Emergency department discussed with trauma surgery Dr. Dwain SarnaWakefield who recommended medicine admission to [facility] with trauma consult pain.  Emergency department also discussed with Dr. Roda ShuttersXu of orthopedics who will consult on the patient.   Review of Systems: As per HPI otherwise 10 point review of systems negative.    Past Medical History:  Diagnosis Date  . Anxiety   . Arthritis    "back" (01/24/2014)  . Asthma   . Chronic bronchitis (HCC)   . Cluster headache    "last bout was summer 2014; had them q spring 1991-2001" (01/24/2014)  . COPD (chronic obstructive pulmonary disease) (HCC)   . GERD (gastroesophageal reflux disease)   . NSTEMI (non-ST elevated myocardial infarction) (HCC) 01/2014  . On home oxygen therapy    "2L; 24/7" (01/24/2014)  . Pneumonia 06/2013; 01/2014  . Pulmonary embolism (HCC) 01/23/2014  . Seasonal allergies     Past Surgical History:  Procedure Laterality Date  . COLONOSCOPY WITH PROPOFOL N/A 10/20/2016   Procedure: COLONOSCOPY WITH PROPOFOL;  Surgeon: Sherrilyn RistHenry L Danis III, MD;  Location: [facility] ENDOSCOPY;  Service: Gastroenterology;  Laterality: N/A;  . LAPAROTOMY N/A 09/04/2019   Procedure: EXPLORATORY LAPAROTOMY;  Surgeon: Ovidio KinNewman, David, MD;  Location: [facility] ORS;  Service: General;  Laterality: N/A;  . NASAL SEPTOPLASTY W/ TURBINOPLASTY Left 1983  . TOTAL HIP ARTHROPLASTY Left 07/16/2017  Procedure: TOTAL HIP ARTHROPLASTY ANTERIOR APPROACH;  Surgeon: Tarry Kos, MD;  Location: [facility] OR;  Service: Orthopedics;  Laterality: Left;  . TURBINATE REDUCTION Bilateral ~ 1983     reports that he quit smoking about 8 years ago. His smoking use included cigarettes. He has a 70.00 pack-year smoking history. He has never used smokeless tobacco. He  reports previous alcohol use. He reports that he does not use drugs.  Allergies  Allergen Reactions  . Naproxen Sodium Itching    Family History  Problem Relation Age of Onset  . Leukemia Father   . Heart disease Father   . Asthma Father   . Asthma Sister   . Emphysema Sister   . Heart disease Sister   . High blood pressure Mother   . Colon cancer Cousin        First cousin    Prior to Admission medications   Medication Sig Start Date End Date Taking? Authorizing Provider  albuterol (PROVENTIL) (2.5 MG/3ML) 0.083% nebulizer solution Take 3 mLs (2.5 mg total) by nebulization every 6 (six) hours as needed for wheezing or shortness of breath. 01/09/20  Yes Icard, Bradley L, DO  albuterol (VENTOLIN HFA) 108 (90 Base) MCG/ACT inhaler Inhale 2 puffs into the lungs every 6 (six) hours as needed for wheezing or shortness of breath. 11/09/19  Yes Icard, Bradley L, DO  atorvastatin (LIPITOR) 10 MG tablet Take 1 tablet (10 mg total) by mouth daily. 06/05/20  Yes [name], MD  azithromycin [facility]) 250 MG tablet Written Order reads: Azithromycin 250 mg once daily on Monday, Wednesday, Friday Patient taking differently: Take 250 mg by mouth See admin instructions. Azithromycin 250 mg once daily on Monday, Wednesday, Friday 10/21/20  Yes Glenford Bayley, NP  Budeson-Glycopyrrol-Formoterol (BREZTRI AEROSPHERE) 160-9-4.8 MCG/ACT AERO Inhale 2 puffs into the lungs 2 (two) times daily. 08/08/20  Yes Icard, Rachel Bo, DO  cholecalciferol (VITAMIN D3) 25 MCG (1000 UNIT) tablet Take 1,000 Units by mouth daily.   Yes [provider]  citalopram (CELEXA) 20 MG tablet Take 1.5 tablets (30 mg total) by mouth daily. Patient taking differently: Take 20 mg by mouth daily. 10/11/20  Yes [name], MD  fluticasone [facility]) 50 MCG/ACT nasal spray Place 1 spray into both nostrils daily. 11/09/19  Yes Icard, Rachel Bo, DO  Guaifenesin (MUCINEX MAXIMUM STRENGTH) 1200 MG TB12 Take 1,200 mg by mouth  2 (two) times daily.   Yes [provider]  montelukast (SINGULAIR) 10 MG tablet TAKE 1 TABLET(10 MG) BY MOUTH AT BEDTIME Patient taking differently: Take 10 mg by mouth at bedtime. 12/12/19  Yes Icard, Rachel Bo, DO  Multiple Vitamin (MULTI VITAMIN PO) Take 1 tablet by mouth daily.   Yes [provider]  OXYGEN Inhale 2 L/min into the lungs continuous. For sats less than 90%   Yes [provider]  predniSONE (DELTASONE) 20 MG tablet Take 1 tablet daily starting 10/28/20 Patient taking differently: Take 20 mg by mouth daily. Take 1 tablet daily starting 10/28/20 10/21/20  Yes Glenford Bayley, NP  sodium chloride (OCEAN) 0.65 % SOLN nasal spray Place 1 spray into both nostrils 4 (four) times daily as needed for congestion. 11/09/19  Yes Icard, Rachel Bo, DO  triamterene-hydrochlorothiazide (MAXZIDE-25) 37.5-25 MG tablet TAKE 1 TABLET BY MOUTH DAILY 05/10/20  Yes [name], MD  XARELTO 20 MG TABS tablet TAKE 1 TABLET(20 MG) BY MOUTH EVERY EVENING Patient taking differently: Take 20 mg by mouth daily. 10/14/20  Yes Glenford Bayley, NP  acetaminophen (TYLENOL) 500 MG tablet Take 1,000 mg by mouth every 6 (six) hours as needed.    [provider]  Budeson-Glycopyrrol-Formoterol (BREZTRI AEROSPHERE) 160-9-4.8 MCG/ACT AERO Inhale 2 puffs into the lungs in the morning and at bedtime. Patient not taking: No sig reported 10/21/20   Glenford Bayley, NP  guaiFENesin (MUCINEX) 600 MG 12 hr tablet Take 2 tablets (1,200 mg total) by mouth 2 (two) times daily. Patient not taking: No sig reported 10/09/19   Margit Hanks, MD  hydrOXYzine (ATARAX/VISTARIL) 10 MG tablet Take 1 tablet (10 mg total) by mouth 3 (three) times daily as needed. Patient not taking: No sig reported 05/07/20   [name], MD  predniSONE (DELTASONE) 10 MG tablet Take 4 tabs po daily x 3 days; then 3 tabs daily x3 days; then resume daily prednisone Patient not taking: No sig reported 10/21/20    Glenford Bayley, NP  Vitamin D, Ergocalciferol, (DRISDOL) 1.25 MG (50000 UNIT) CAPS capsule Take 1 capsule (50,000 Units total) by mouth every 7 (seven) days. Patient not taking: No sig reported 06/05/20   [name], MD    Physical Exam: Vitals:   27-Oct-2020 1646 10-27-20 1849 10/27/2020 2012 10/13/2020 0104  BP: (!) 178/92 (!) 154/93 (!) 149/96 (!) 135/93  Pulse: (!) 130 (!) 118 90 (!) 111  Resp: 20 17 19 20   Temp:      TempSrc:      SpO2: 96% 97% 97% 98%      Constitutional: NAD, calm, comfortable Vitals:   October 27, 2020 1646 27-Oct-2020 1849 10-27-20 2012 11/01/2020 0104  BP: (!) 178/92 (!) 154/93 (!) 149/96 (!) 135/93  Pulse: (!) 130 (!) 118 90 (!) 111  Resp: 20 17 19 20   Temp:      TempSrc:      SpO2: 96% 97% 97% 98%   Eyes: PERRL, lids and conjunctivae normal ENMT: Mucous membranes are moist. Posterior pharynx clear of any exudate or lesions.Normal dentition.  Neck: normal, supple, no masses, no thyromegaly Respiratory: clear to auscultation bilaterally, no wheezing, no crackles. Normal respiratory effort. No accessory muscle use.  Cardiovascular: Regular rate and rhythm, no murmurs / rubs / gallops. No extremity edema. 2+ pedal pulses. No carotid bruits.  Abdomen: no tenderness, no masses palpated. No hepatosplenomegaly. Bowel sounds positive.  Musculoskeletal: no clubbing / cyanosis. No joint deformity upper and lower extremities. Good ROM, no contractures. Normal muscle tone.  Skin: no rashes, lesions, ulcers. No induration Neurologic: CN 2-12 grossly intact. Sensation intact, DTR normal. Strength 5/5 in all 4.  Psychiatric: Normal judgment and insight. Alert and oriented x 3. Normal mood.   Labs on Admission: I have personally reviewed following labs and imaging studies  CBC: Recent Labs  Lab 10/27/2020 1437  WBC 16.2*  NEUTROABS 14.6*  HGB 13.7  HCT 40.6  MCV 94.4  PLT 369   Basic Metabolic Panel: Recent Labs  Lab 10-27-20 1437  NA 128*  K 5.1  CL 91*  CO2 27   GLUCOSE 114*  BUN 13  CREATININE 0.88  CALCIUM 8.9   GFR: Estimated Creatinine Clearance: 101.4 mL/min (by C-G formula based on SCr of 0.88 mg/dL). Liver Function Tests: Recent Labs  Lab October 27, 2020 1437  AST 30  ALT 26  ALKPHOS 60  BILITOT 1.0  PROT 7.1  ALBUMIN 3.9   No results for input(s): LIPASE, AMYLASE in the last 168 hours. No results for input(s): AMMONIA in the last 168 hours. Coagulation  Profile: Recent Labs  Lab 10/14/2020 1437  INR 1.2   Cardiac Enzymes: No results for input(s): CKTOTAL, CKMB, CKMBINDEX, TROPONINI in the last 168 hours. BNP (last 3 results) No results for input(s): PROBNP in the last 8760 hours. HbA1C: No results for input(s): HGBA1C in the last 72 hours. CBG: No results for input(s): GLUCAP in the last 168 hours. Lipid Profile: No results for input(s): CHOL, HDL, LDLCALC, TRIG, CHOLHDL, LDLDIRECT in the last 72 hours. Thyroid Function Tests: No results for input(s): TSH, T4TOTAL, FREET4, T3FREE, THYROIDAB in the last 72 hours. Anemia Panel: No results for input(s): VITAMINB12, FOLATE, FERRITIN, TIBC, IRON, RETICCTPCT in the last 72 hours. Urine analysis:    Component Value Date/Time   COLORURINE YELLOW 06/03/2020 1713   APPEARANCEUR CLEAR 06/03/2020 1713   LABSPEC 1.020 06/03/2020 1713   PHURINE 6.5 06/03/2020 1713   GLUCOSEU NEGATIVE 06/03/2020 1713   HGBUR TRACE-INTACT (A) 06/03/2020 1713   BILIRUBINUR NEGATIVE 06/03/2020 1713   KETONESUR NEGATIVE 06/03/2020 1713   PROTEINUR NEGATIVE 05/17/2008 2303   UROBILINOGEN 0.2 06/03/2020 1713   NITRITE NEGATIVE 06/03/2020 1713   LEUKOCYTESUR NEGATIVE 06/03/2020 1713   Sepsis Labs: @LABRCNTIP (procalcitonin:4,lacticidven:4) ) Recent Results (from the past 240 hour(s))  Resp Panel by RT-PCR (Flu A&B, Covid) Nasopharyngeal Swab     Status: None   Collection Time: 10/26/2020  4:54 PM   Specimen: Nasopharyngeal Swab; Nasopharyngeal(NP) swabs in vial transport medium  Result Value Ref Range  Status   SARS Coronavirus 2 by RT PCR NEGATIVE NEGATIVE Final    Comment: (NOTE) SARS-CoV-2 target nucleic acids are NOT DETECTED.  The SARS-CoV-2 RNA is generally detectable in upper respiratory specimens during the acute phase of infection. The lowest concentration of SARS-CoV-2 viral copies this assay can detect is 138 copies/mL. A negative result does not preclude SARS-Cov-2 infection and should not be used as the sole basis for treatment or other patient management decisions. A negative result may occur with  improper specimen collection/handling, submission of specimen other than nasopharyngeal swab, presence of viral mutation(s) within the areas targeted by this assay, and inadequate number of viral copies(<138 copies/mL). A negative result must be combined with clinical observations, patient history, and epidemiological information. The expected result is Negative.  Fact Sheet for Patients:  BloggerCourse.com  Fact Sheet for Healthcare Providers:  SeriousBroker.it  This test is no t yet approved or cleared by the [place] FDA and  has been authorized for detection and/or diagnosis of SARS-CoV-2 by FDA under an Emergency Use Authorization (EUA). This EUA will remain  in effect (meaning this test can be used) for the duration of the COVID-19 declaration under Section 564(b)(1) of the Act, 21 U.S.C.section 360bbb-3(b)(1), unless the authorization is terminated  or revoked sooner.       Influenza A by PCR NEGATIVE NEGATIVE Final   Influenza B by PCR NEGATIVE NEGATIVE Final    Comment: (NOTE) The Xpert Xpress SARS-CoV-2/FLU/RSV plus assay is intended as an aid in the diagnosis of influenza from Nasopharyngeal swab specimens and should not be used as a sole basis for treatment. Nasal washings and aspirates are unacceptable for Xpert Xpress SARS-CoV-2/FLU/RSV testing.  Fact Sheet for  Patients: BloggerCourse.com  Fact Sheet for Healthcare Providers: SeriousBroker.it  This test is not yet approved or cleared by the [place] FDA and has been authorized for detection and/or diagnosis of SARS-CoV-2 by FDA under an Emergency Use Authorization (EUA). This EUA will remain in effect (meaning this test can be used) for the duration of  the COVID-19 declaration under Section 564(b)(1) of the Act, 21 U.S.C. section 360bbb-3(b)(1), unless the authorization is terminated or revoked.  Performed at [facility], [address] [place], [place] [address]      Radiological Exams on Admission: DG Ribs Unilateral W/Chest Right  Result Date: 10/28/2020 CLINICAL DATA:  Recent fall with right-sided chest pain, initial encounter EXAM: RIGHT RIBS AND CHEST - 3+ VIEW COMPARISON:  09/11/2019 FINDINGS: Cardiac shadow is within normal limits. The lungs are well aerated bilaterally. Posterior rib fracture is noted involving the right seventh rib. Undisplaced fractures involving the right eighth and ninth ribs are also seen posteriorly. No pneumothorax is noted. No other focal abnormality is seen. IMPRESSION: Fractures involving the right seventh through ninth ribs. Electronically Signed   By: Alcide Clever M.D.   On: 10/17/2020 16:09   CT Head Wo Contrast  Result Date: 10/26/2020 CLINICAL DATA:  Head trauma. Fall off scale at home. EXAM: CT HEAD WITHOUT CONTRAST TECHNIQUE: Contiguous axial images were obtained from the base of the skull through the vertex without intravenous contrast. COMPARISON:  None. FINDINGS: Brain: Age related atrophy. No intracranial hemorrhage, mass effect, or midline shift. No hydrocephalus. The basilar cisterns are patent. No evidence of territorial infarct or acute ischemia. Hyperdensity along the superior aspect of the falx is felt to be related to dural calcifications. No extra-axial or  intracranial fluid collection. Vascular: Atherosclerosis of skullbase vasculature without hyperdense vessel or abnormal calcification. Skull: No fracture or focal lesion. Sinuses/Orbits: Mucous retention cysts in the right and left maxillary sinus. No sinus fluid levels. Mastoid air cells are clear. Bilateral cataract resection. Other: None. IMPRESSION: 1. No acute intracranial abnormality. No skull fracture. 2. Age related atrophy. Electronically Signed   By: Narda Rutherford M.D.   On: 11/04/2020 18:29   CT Chest W Contrast  Result Date: 11/02/2020 CLINICAL DATA:  Abdominal trauma status post fall backwards. Left hip and upper back pain. Rib fracture suspected. EXAM: CT CHEST, ABDOMEN, AND PELVIS WITH CONTRAST TECHNIQUE: Multidetector CT imaging of the chest, abdomen and pelvis was performed following the standard protocol during bolus administration of intravenous contrast. CONTRAST:  OMNIPAQUE IOHEXOL 300 MG/ML  SOLN COMPARISON:  CT angio chest 12/03/2016, x-ray ribs 10/29/2020. CT chest 08/05/2020, CT abdomen pelvis 09/04/2019 FINDINGS: CHEST: Ports and Devices: None. Lungs/airways: Biapical pleural/pulmonary scarring. Severe centrilobular emphysematous changes. No focal consolidation. No pulmonary nodule. No pulmonary mass. No pulmonary contusion or laceration. No pneumatocele formation. The central airways are patent. Pleura: No pleural effusion. No pneumothorax. No hemothorax. Lymph Nodes: No mediastinal, hilar, or axillary lymphadenopathy. Mediastinum: No pneumomediastinum. No aortic injury or mediastinal hematoma. The thoracic aorta is normal in caliber. At least mild atherosclerotic plaque. The heart is normal in size. No significant pericardial effusion. Least mild three-vessel coronary artery calcifications. The main pulmonary artery is normal in caliber. No central or segmental pulmonary embolus. The esophagus is unremarkable. The thyroid is unremarkable. Chest Wall / Breasts: No chest wall  mass. Musculoskeletal: Acute displaced fracture of the right posterolateral 7-9 ribs (4:31, 37, 48). Prior fracture of the posterior right eighth rib. No acute displaced left rib fractures. No acute sternal fracture. Similar-appearing (as far back as 2015) cortical erosion of the posterior inferior sternal body (8: 107-113). No spinal fracture. ABDOMEN / PELVIS: Liver: Not enlarged. No focal lesion. No laceration or subcapsular hematoma. Biliary System: The gallbladder is otherwise unremarkable with no radio-opaque gallstones. No biliary ductal dilatation. Pancreas: Normal pancreatic contour. No main pancreatic duct dilatation.  Spleen: Not enlarged. No focal lesion. No laceration, subcapsular hematoma, or vascular injury. Adrenal Glands: No nodularity bilaterally. Kidneys: Bilateral kidneys enhance symmetrically. Subcentimeter hypodensities are too small to characterize. The there is a calcified 4 mm left renal stone. No hydronephrosis. No contusion, laceration, or subcapsular hematoma. No injury to the vascular structures or collecting systems. No hydroureter. Limited evaluation of the urinary bladder due to streak artifact originating from the left femoral surgical hardware. Bowel: No small or large bowel wall thickening or dilatation. Diffuse sigmoid diverticulosis. The appendix is unremarkable. Mesentery, Omentum, and Peritoneum: No simple free fluid ascites. No pneumoperitoneum. No hemoperitoneum. No mesenteric hematoma identified. No organized fluid collection. Pelvic Organs: Nonvisualization due to streak artifact originating from the left femoral surgical hardware. Lymph Nodes: No abdominal, pelvic, inguinal lymphadenopathy. Vasculature: Severe calcified and noncalcified atherosclerotic plaque. No abdominal aorta or iliac aneurysm. No active contrast extravasation or pseudoaneurysm. Musculoskeletal: Couple of small supraumbilical fat containing umbilical hernia. Half shaft width displaced left inferior pubic  rami fracture. Nondisplaced left ilium fracture that extends to the true left sacroiliac joint (7:129). No associated sacroiliac joint diastasis or pubic symphysis diastasis. Partially visualized total left arthroplasty of the hip with no definite findings to suggest surgical hardware complication. Right femoral head avascular necrosis. Mild joint space narrowing of the right femoroacetabular joint. No spinal fracture. IMPRESSION: 1. Half shaft width displaced left inferior pubic rami fracture. 2. Nondisplaced left ilium fracture that extends to the sacroiliac joint. 3. Acute displaced fracture of the right 7-9 ribs in a patient with healed posterior 8th rib fracture. No associated pneumothorax. 4. No acute traumatic injury to the chest, abdomen, or pelvis with limited evaluation of the urinary bladder due to streak artifact originating from the left femoral hardware. 5. No acute fracture or traumatic malalignment of the thoracic or lumbar spine. 6. Other imaging findings of potential clinical significance: Nonobstructive 4 mm in left nephrolithiasis. Sigmoid diverticulosis. Right femoral head avascular necrosis. Total left hip arthroplasty. Aortic Atherosclerosis (ICD10-I70.0) and Emphysema (ICD10-J43.9). Electronically Signed   By: Tish Frederickson M.D.   On: 10/28/2020 18:58   CT Cervical Spine Wo Contrast  Result Date: 10/27/2020 CLINICAL DATA:  Neck trauma (Age >= 65y) Fall backwards off scale at home. EXAM: CT CERVICAL SPINE WITHOUT CONTRAST TECHNIQUE: Multidetector CT imaging of the cervical spine was performed without intravenous contrast. Multiplanar CT image reconstructions were also generated. COMPARISON:  None. FINDINGS: Alignment: Trace retrolisthesis of C4 on C5. Alignment otherwise maintained. Skull base and vertebrae: Slight loss of height of superior endplate of C7 is age indeterminate, but new from chest CT 12/03/2016. Remaining vertebral body heights are preserved. The dens and skull base are  intact. Soft tissues and spinal canal: No prevertebral fluid or swelling. No visible canal hematoma. Disc levels: Multilevel degenerative disc disease with diffuse disc space narrowing and endplate spurring, most prominent at C4-C5. Multilevel facet hypertrophy. Upper chest: Assessed on concurrent chest CT, reported separately. Biapical pleuroparenchymal scarring. Other: Carotid calcifications. IMPRESSION: 1. Slight loss of height of superior endplate of C7 is age indeterminate, but new from chest CT 12/03/2016. There is no associated prevertebral soft tissue edema suggesting this is chronic. 2. Multilevel degenerative disc disease and facet hypertrophy throughout the cervical spine. Electronically Signed   By: Narda Rutherford M.D.   On: 10/23/2020 18:36   CT ABDOMEN PELVIS W CONTRAST  Result Date: 10/28/2020 CLINICAL DATA:  Abdominal trauma status post fall backwards. Left hip and upper back pain. Rib fracture suspected. EXAM: CT CHEST, ABDOMEN,  AND PELVIS WITH CONTRAST TECHNIQUE: Multidetector CT imaging of the chest, abdomen and pelvis was performed following the standard protocol during bolus administration of intravenous contrast. CONTRAST:  OMNIPAQUE IOHEXOL 300 MG/ML  SOLN COMPARISON:  CT angio chest 12/03/2016, x-ray ribs 10/30/2020. CT chest 08/05/2020, CT abdomen pelvis 09/04/2019 FINDINGS: CHEST: Ports and Devices: None. Lungs/airways: Biapical pleural/pulmonary scarring. Severe centrilobular emphysematous changes. No focal consolidation. No pulmonary nodule. No pulmonary mass. No pulmonary contusion or laceration. No pneumatocele formation. The central airways are patent. Pleura: No pleural effusion. No pneumothorax. No hemothorax. Lymph Nodes: No mediastinal, hilar, or axillary lymphadenopathy. Mediastinum: No pneumomediastinum. No aortic injury or mediastinal hematoma. The thoracic aorta is normal in caliber. At least mild atherosclerotic plaque. The heart is normal in size. No significant  pericardial effusion. Least mild three-vessel coronary artery calcifications. The main pulmonary artery is normal in caliber. No central or segmental pulmonary embolus. The esophagus is unremarkable. The thyroid is unremarkable. Chest Wall / Breasts: No chest wall mass. Musculoskeletal: Acute displaced fracture of the right posterolateral 7-9 ribs (4:31, 37, 48). Prior fracture of the posterior right eighth rib. No acute displaced left rib fractures. No acute sternal fracture. Similar-appearing (as far back as 2015) cortical erosion of the posterior inferior sternal body (8: 107-113). No spinal fracture. ABDOMEN / PELVIS: Liver: Not enlarged. No focal lesion. No laceration or subcapsular hematoma. Biliary System: The gallbladder is otherwise unremarkable with no radio-opaque gallstones. No biliary ductal dilatation. Pancreas: Normal pancreatic contour. No main pancreatic duct dilatation. Spleen: Not enlarged. No focal lesion. No laceration, subcapsular hematoma, or vascular injury. Adrenal Glands: No nodularity bilaterally. Kidneys: Bilateral kidneys enhance symmetrically. Subcentimeter hypodensities are too small to characterize. The there is a calcified 4 mm left renal stone. No hydronephrosis. No contusion, laceration, or subcapsular hematoma. No injury to the vascular structures or collecting systems. No hydroureter. Limited evaluation of the urinary bladder due to streak artifact originating from the left femoral surgical hardware. Bowel: No small or large bowel wall thickening or dilatation. Diffuse sigmoid diverticulosis. The appendix is unremarkable. Mesentery, Omentum, and Peritoneum: No simple free fluid ascites. No pneumoperitoneum. No hemoperitoneum. No mesenteric hematoma identified. No organized fluid collection. Pelvic Organs: Nonvisualization due to streak artifact originating from the left femoral surgical hardware. Lymph Nodes: No abdominal, pelvic, inguinal lymphadenopathy. Vasculature: Severe  calcified and noncalcified atherosclerotic plaque. No abdominal aorta or iliac aneurysm. No active contrast extravasation or pseudoaneurysm. Musculoskeletal: Couple of small supraumbilical fat containing umbilical hernia. Half shaft width displaced left inferior pubic rami fracture. Nondisplaced left ilium fracture that extends to the true left sacroiliac joint (7:129). No associated sacroiliac joint diastasis or pubic symphysis diastasis. Partially visualized total left arthroplasty of the hip with no definite findings to suggest surgical hardware complication. Right femoral head avascular necrosis. Mild joint space narrowing of the right femoroacetabular joint. No spinal fracture. IMPRESSION: 1. Half shaft width displaced left inferior pubic rami fracture. 2. Nondisplaced left ilium fracture that extends to the sacroiliac joint. 3. Acute displaced fracture of the right 7-9 ribs in a patient with healed posterior 8th rib fracture. No associated pneumothorax. 4. No acute traumatic injury to the chest, abdomen, or pelvis with limited evaluation of the urinary bladder due to streak artifact originating from the left femoral hardware. 5. No acute fracture or traumatic malalignment of the thoracic or lumbar spine. 6. Other imaging findings of potential clinical significance: Nonobstructive 4 mm in left nephrolithiasis. Sigmoid diverticulosis. Right femoral head avascular necrosis. Total left hip arthroplasty. Aortic Atherosclerosis (ICD10-I70.0)  and Emphysema (ICD10-J43.9). Electronically Signed   By: Tish Frederickson M.D.   On: 2020/11/04 18:58   DG Hip Unilat W or Wo Pelvis 2-3 Views Left  Result Date: 11-04-20 CLINICAL DATA:  Fall off scale at home. Left hip pain. EXAM: DG HIP (WITH OR WITHOUT PELVIS) 2-3V LEFT COMPARISON:  11/12/2017 FINDINGS: Left hip arthroplasty in expected alignment. There is no periprosthetic lucency or fracture. Femoral stem is midline. Pubic rami are intact. Pubic symphysis and  sacroiliac joints are congruent. Serpiginous subchondral sclerosis involving the right femoral head may represent avascular necrosis or large subchondral cysts. This is stable from prior exam. IMPRESSION: 1. No acute fracture of the pelvis or left hip. 2. Left hip arthroplasty without complication. 3. 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 Electronically Signed   By: Narda Rutherford M.D.   On: 11-04-20 16:07    Assessment/Plan Active Problems:   COPD GOLD IV    Hyponatremia   History of pulmonary embolism   Pelvic fracture (HCC)   Rib fractures  COPD not in exacerbation  Does sound rhonchorus on slight increase O2 from baseline but saturation far above goal 88%. With his advanced disease and now rib fractures. He is very high risk for pneumonia although no current evidence of pneumonia on CT imaging. Will consult pulmonology in am.  PLAN - Consult pulm - Albuterol nebs PRN - Home BREZTRI AEROSPHERE - Home montelukast - Home prednisone - Home azithromycin - supplemental oxygen - continuous pulse ox  Rib fractures Pelvic fractures - trauma following - aggressive pulmonary toilet  - analgesia  - NPO incase of potential surgical management  Moderate Hyponatremia  Unclear etiology - Serum osm - Urine osm - Urine Na  Leukocytosis  - likely related to fall   AVN of femoral head - Ed consulted ortho Dr. Roda Shutters   pAF Hx PE - Heparin gtt for now in case of operative management  - ECG     DVT prophylaxis: Heparin gtt for hx of PE and AF with [place] vasc 6 Code Status: DNR Disposition Plan: 4-5 days  Consults called: Dr. Dwain Sarna trauma and Dr. Roda Shutters ortho Admission status: progressive care at [facility] inpatient   Severity of Illness: The appropriate patient status for this patient is INPATIENT. Inpatient status is judged to be reasonable and necessary in order to provide the required intensity of service to ensure the patient's safety.  The patient's presenting symptoms, physical exam findings, and initial radiographic and laboratory data in the context of their chronic comorbidities is felt to place them at high risk for further clinical deterioration. Furthermore, it is not anticipated that the patient will be medically stable for discharge from the hospital within 2 midnights of admission. The following factors support the patient status of inpatient.   " The patient's presenting symptoms include mechanical fall. " The worrisome physical exam findings include thoracic pain and splinting . " The initial radiographic and laboratory data are worrisome because of rib fractures and pelvic fracture. " The chronic co-morbidities include severe COPD, CAD, pAF, Hx PEs.   * I certify that at the point of admission it is my clinical judgment that the patient will require inpatient hospital care spanning beyond 2 midnights from the point of admission due to high intensity of service, high risk for further deterioration and high frequency of surveillance required.*    Delton See Symantha Steeber MD [facility] Pager [phone]  If 7PM-7AM, please contact night-coverage [email or web address] Password [email or web address]  2020/11/01, 2:48 AM

## 2020-10-25 ENCOUNTER — Inpatient Hospital Stay (HOSPITAL_COMMUNITY): Payer: PPO

## 2020-10-25 ENCOUNTER — Other Ambulatory Visit: Payer: Self-pay

## 2020-10-25 DIAGNOSIS — Z86711 Personal history of pulmonary embolism: Secondary | ICD-10-CM

## 2020-10-25 DIAGNOSIS — S2249XA Multiple fractures of ribs, unspecified side, initial encounter for closed fracture: Secondary | ICD-10-CM

## 2020-10-25 LAB — SODIUM, URINE, RANDOM: Sodium, Ur: 10 mmol/L

## 2020-10-25 LAB — BASIC METABOLIC PANEL
Anion gap: 12 (ref 5–15)
BUN: 24 mg/dL — ABNORMAL HIGH (ref 8–23)
CO2: 25 mmol/L (ref 22–32)
Calcium: 9.1 mg/dL (ref 8.9–10.3)
Chloride: 91 mmol/L — ABNORMAL LOW (ref 98–111)
Creatinine, Ser: 1.52 mg/dL — ABNORMAL HIGH (ref 0.61–1.24)
GFR, Estimated: 50 mL/min — ABNORMAL LOW (ref >60)
Glucose, Bld: 121 mg/dL — ABNORMAL HIGH (ref 70–99)
Potassium: 5 mmol/L (ref 3.5–5.1)
Sodium: 128 mmol/L — ABNORMAL LOW (ref 135–145)

## 2020-10-25 LAB — BLOOD GAS, ARTERIAL
Acid-base deficit: 9.3 mmol/L — ABNORMAL HIGH (ref 0.0–2.0)
Bicarbonate: 24.5 mmol/L (ref 20.0–28.0)
FIO2: 36
O2 Saturation: 87.7 %
Patient temperature: 98.6
pCO2 arterial: 107 mmHg (ref 32.0–48.0)
pH, Arterial: 6.991 — CL (ref 7.350–7.450)
pO2, Arterial: 79.6 mmHg — ABNORMAL LOW (ref 83.0–108.0)

## 2020-10-25 LAB — URINALYSIS, ROUTINE W REFLEX MICROSCOPIC
Bilirubin Urine: NEGATIVE
Glucose, UA: NEGATIVE mg/dL
Hgb urine dipstick: NEGATIVE
Ketones, ur: 5 mg/dL — AB
Leukocytes,Ua: NEGATIVE
Nitrite: NEGATIVE
Protein, ur: NEGATIVE mg/dL
Specific Gravity, Urine: >1.046 — ABNORMAL HIGH (ref 1.005–1.030)
pH: 5 (ref 5.0–8.0)

## 2020-10-25 LAB — OSMOLALITY, URINE: Osmolality, Ur: 449 mOsm/kg (ref 300–900)

## 2020-10-25 LAB — PROCALCITONIN: Procalcitonin: 0.62 ng/mL

## 2020-10-25 LAB — APTT
aPTT: 38 seconds — ABNORMAL HIGH (ref 24–36)
aPTT: 128 seconds — ABNORMAL HIGH (ref 24–36)

## 2020-10-25 LAB — OSMOLALITY: Osmolality: 280 mOsm/kg (ref 275–295)

## 2020-10-25 LAB — HEPARIN LEVEL (UNFRACTIONATED): Heparin Unfractionated: 1.26 IU/mL — ABNORMAL HIGH (ref 0.30–0.70)

## 2020-10-25 LAB — CBG MONITORING, ED: Glucose-Capillary: 174 mg/dL — ABNORMAL HIGH (ref 70–99)

## 2020-10-25 MED ORDER — NALOXONE HCL 2 MG/2ML IJ SOSY
PREFILLED_SYRINGE | INTRAMUSCULAR | Status: AC
  Filled 2020-10-25: qty 2

## 2020-10-25 MED ORDER — HEPARIN BOLUS VIA INFUSION
2000.0000 [IU] | Freq: Once | INTRAVENOUS | Status: AC
  Administered 2020-10-25: 06:00:00 2000 [IU] via INTRAVENOUS
  Filled 2020-10-25: qty 2000

## 2020-10-25 MED ORDER — MORPHINE SULFATE (PF) 4 MG/ML IV SOLN
4.0000 mg | INTRAVENOUS | Status: DC | PRN
  Administered 2020-10-25: 4 mg via INTRAVENOUS
  Filled 2020-10-25: qty 1

## 2020-10-25 MED ORDER — NALOXONE HCL 0.4 MG/ML IJ SOLN
INTRAMUSCULAR | Status: AC
  Administered 2020-10-25: 10:00:00 0.4 mg/mL
  Filled 2020-10-25: qty 1

## 2020-11-09 NOTE — ED Notes (Signed)
Dr. Toniann Fail notified of patients respiratory assessment revealing bilateral crackles. Patient is on a Heparin drip.

## 2020-11-09 NOTE — ED Notes (Signed)
Provided patient with urinal and told him we need a urine sample. He said he does not feel the urge to go now.

## 2020-11-09 NOTE — Progress Notes (Signed)
ANTICOAGULATION CONSULT NOTE  Pharmacy Consult for IV heparin Indication: Afib, recurrent DVT  Allergies  Allergen Reactions  . Naproxen Sodium Itching    Patient Measurements:   Heparin Dosing Weight: TBW  Vital Signs: BP: 144/81 (12/17 0430) Pulse Rate: 116 (12/17 0430)  Labs: Recent Labs    10/31/2020 1437 11/06/2020 2208 11-05-20 0346  HGB 13.7  --   --   HCT 40.6  --   --   PLT 369  --   --   APTT  --  29 38*  LABPROT 15.0  --   --   INR 1.2  --   --   HEPARINUNFRC  --  1.68* 1.26*  CREATININE 0.88  --  1.52*    Estimated Creatinine Clearance: 58.7 mL/min (A) (by C-G formula based on SCr of 1.52 mg/dL (H)).   Medical History: Past Medical History:  Diagnosis Date  . Anxiety   . Arthritis    "back" (01/24/2014)  . Asthma   . Chronic bronchitis (HCC)   . Cluster headache    "last bout was summer 2014; had them q spring 1991-2001" (01/24/2014)  . COPD (chronic obstructive pulmonary disease) (HCC)   . GERD (gastroesophageal reflux disease)   . NSTEMI (non-ST elevated myocardial infarction) (HCC) 01/2014  . On home oxygen therapy    "2L; 24/7" (01/24/2014)  . Pneumonia 06/2013; 01/2014  . Pulmonary embolism (HCC) 01/23/2014  . Seasonal allergies     Medications:  (Not in a hospital admission)  Scheduled:  . atorvastatin  10 mg Oral Daily  . azithromycin  250 mg Oral Q M,W,F  . cholecalciferol  1,000 Units Oral Daily  . citalopram  20 mg Oral Daily  . fluticasone  1 spray Each Nare Daily  . fluticasone furoate-vilanterol  1 puff Inhalation Daily  . guaiFENesin  1,200 mg Oral BID  . montelukast  10 mg Oral QHS  . multivitamin with minerals  1 tablet Oral Daily  . predniSONE  20 mg Oral Daily  . umeclidinium bromide  1 puff Inhalation Daily   Infusions:  . heparin 1,300 Units/hr (11/07/2020 2238)  . lactated ringers 50 mL/hr at 10/10/2020 2234   Assessment: 59 yoM with PMH COPD, Afib, PE 2015 & 2018 on Xarelto, admitted after fall at home   Baseline INR,  aPTT: pending  Prior anticoagulation: Xarelto 20 mg daily; LD 12/15 at 2230  Significant events:  Today, 2020/11/05:  APTT 38 subtherapeutic on 1300 units/hr  Heparin elevated at 1.26 as expected  CBC: WNL (12/16)  SCr 1.52  No bleeding or infusion issues per nursing  Goal of Therapy: Heparin level 0.3-0.7 units/ml Monitor platelets by anticoagulation protocol: Yes  Plan:  Bolus heparin 2000 units x 1  Increase Heparin 1600 units/hr IV infusion  Check aPTT in 6 hours  Daily CBC, daily heparin level once stable  Monitor for signs of bleeding or thrombosis  Arley Phenix RPh 11/05/2020, 5:28 AM

## 2020-11-09 NOTE — Death Summary Note (Signed)
Death Summary  CALISTRO RAUF KLK:[id number] DOB: Jun 08, 1956 DOA: 10-28-2020  PCP: [name], MD PCP/Office notified:  Admit date: 10-28-20 Date of Death: Oct 29, 2020  Final Diagnoses:  Active Problems:   COPD GOLD IV    Hyponatremia   History of pulmonary embolism   Pelvic fracture (HCC)   Rib fractures   Possible cause of death 1. Acute on chronic hypoxic respiratory failure with profound respiratory acidosis. 2. COPD stage IV on chronic oxygen at home.   History of present illness:   Theodore [name] is a 65 y.o. male with medical history significant for COPD Gold stage IV, chronic respiratory failure on home 3L [place], CAD, pAF, history of recurrent pulmonary embolisms who presents to [facility] after ground-level fall.  Patient denies head trauma but did have shortness of breath on presentation.  He had recently seen palliative care and decided to be DNR.   In the ED patient was mildly tachypneic saturating 96% on 3 L of oxygen initially.  Sodium was low at 128 with a WBC elevated at 610.2 with left shift.  Covid test was negative.  CT head scan was negative for acute findings.    CT spine weight loss of height C7 which is age-indeterminate but no edema suggesting it is chronic. CT chest showed  displaced left inferior pubic rami fracture.. Nondisplaced left ilium fracture that extended to the sacroiliac joint. Acute displaced fracture of the right 7-9 ribs in a patient with healed posterior 8th rib fracture. No acute traumatic injury to the chest, abdomen, or pelvis with limited evaluation of the urinary bladder due to streak artifact originating from the left femoral hardware. No acute fracture or traumatic malalignment of the thoracic or lumbar spine. Emergency department discussed with trauma surgery Dr. Dwain Sarna who recommended medicine admission to [facility] with trauma consult. Emergency department also discussed with Dr. Roda Shutters of orthopedics.  Hospital Course:  Patient was  still in the emergency department when I was called in to see the patient urgently.  Patient was barely responsive at the time of my evaluation.  ABG was done which showed profound respiratory acidosis.  Patient was hypotensive as well.  Patient was given Narcan, IV fluids were initiated and patient was put on BiPAP.  Patient did not improve and was subsequently declared dead at 74.  Family was notified about his declining status during treatment process.  Time: 0944  Signed:  Chinyere Galiano  [facility] 29-Oct-2020, 2:07 PM

## 2020-11-09 NOTE — Progress Notes (Addendum)
I was called to see the patient urgently in the emergency department.  Patient with a history of very severe severe COPD with chronic respiratory failure, history of coronary artery disease and pulmonary embolism, functional debility was admitted overnight.  Status post fall and rib fracture.  Patient was barely responsive at the time of my evaluation.  He was hypotensive as well.  He had received IV narcotic for pain.  Narcan was given immediately, ABG showed severe respiratory acidosis.  Spoke with the patient's sister on the phone about goals of care and the deteriorating clinical condition of the patient.  Patient is DO NOT RESUSCITATE.  Family wished  comfort if his condition was declining.  BiPAP was initially initiated for ventilation.  Despite that, patient continued to desaturate and deteriorated.  He was also started on 1 L of normal saline bolus.  Patient was subsequently declared death at 14.

## 2020-11-09 DEATH — deceased

## 2020-11-25 ENCOUNTER — Ambulatory Visit: Payer: PPO | Admitting: Internal Medicine
